# Patient Record
Sex: Female | Born: 1945
Health system: Southern US, Community
[De-identification: ages and names within clinical notes are randomized; demographics above are authoritative.]

## PROBLEM LIST (undated history)

## (undated) DIAGNOSIS — R0602 Shortness of breath: Secondary | ICD-10-CM

## (undated) DIAGNOSIS — I7300 Raynaud's syndrome without gangrene: Secondary | ICD-10-CM

## (undated) DIAGNOSIS — D649 Anemia, unspecified: Secondary | ICD-10-CM

## (undated) DIAGNOSIS — K219 Gastro-esophageal reflux disease without esophagitis: Secondary | ICD-10-CM

## (undated) DIAGNOSIS — D7589 Other specified diseases of blood and blood-forming organs: Secondary | ICD-10-CM

## (undated) DIAGNOSIS — Z87412 Personal history of vulvar dysplasia: Secondary | ICD-10-CM

## (undated) DIAGNOSIS — I872 Venous insufficiency (chronic) (peripheral): Secondary | ICD-10-CM

## (undated) DIAGNOSIS — R233 Spontaneous ecchymoses: Secondary | ICD-10-CM

## (undated) DIAGNOSIS — K922 Gastrointestinal hemorrhage, unspecified: Secondary | ICD-10-CM

## (undated) DIAGNOSIS — D071 Carcinoma in situ of vulva: Secondary | ICD-10-CM

## (undated) DIAGNOSIS — F419 Anxiety disorder, unspecified: Secondary | ICD-10-CM

## (undated) DIAGNOSIS — F329 Major depressive disorder, single episode, unspecified: Secondary | ICD-10-CM

## (undated) DIAGNOSIS — I429 Cardiomyopathy, unspecified: Secondary | ICD-10-CM

## (undated) DIAGNOSIS — G4700 Insomnia, unspecified: Secondary | ICD-10-CM

## (undated) DIAGNOSIS — I10 Essential (primary) hypertension: Secondary | ICD-10-CM

## (undated) DIAGNOSIS — F32A Depression, unspecified: Secondary | ICD-10-CM

## (undated) DIAGNOSIS — E871 Hypo-osmolality and hyponatremia: Secondary | ICD-10-CM

## (undated) DIAGNOSIS — J410 Simple chronic bronchitis: Secondary | ICD-10-CM

## (undated) DIAGNOSIS — Z8679 Personal history of other diseases of the circulatory system: Secondary | ICD-10-CM

## (undated) DIAGNOSIS — K6282 Dysplasia of anus: Secondary | ICD-10-CM

## (undated) DIAGNOSIS — M0600 Rheumatoid arthritis without rheumatoid factor, unspecified site: Secondary | ICD-10-CM

## (undated) DIAGNOSIS — E538 Deficiency of other specified B group vitamins: Secondary | ICD-10-CM

## (undated) DIAGNOSIS — Z8709 Personal history of other diseases of the respiratory system: Secondary | ICD-10-CM

## (undated) DIAGNOSIS — R351 Nocturia: Secondary | ICD-10-CM

## (undated) DIAGNOSIS — J449 Chronic obstructive pulmonary disease, unspecified: Secondary | ICD-10-CM

## (undated) DIAGNOSIS — M5430 Sciatica, unspecified side: Secondary | ICD-10-CM

## (undated) DIAGNOSIS — I2510 Atherosclerotic heart disease of native coronary artery without angina pectoris: Secondary | ICD-10-CM

## (undated) DIAGNOSIS — K529 Noninfective gastroenteritis and colitis, unspecified: Secondary | ICD-10-CM

## (undated) DIAGNOSIS — I447 Left bundle-branch block, unspecified: Secondary | ICD-10-CM

## (undated) DIAGNOSIS — R238 Other skin changes: Secondary | ICD-10-CM

## (undated) HISTORY — PX: CARDIOVASCULAR STRESS TEST: SHX262

## (undated) HISTORY — DX: Hypo-osmolality and hyponatremia: E87.1

## (undated) HISTORY — PX: OTHER SURGICAL HISTORY: SHX169

## (undated) HISTORY — DX: Essential (primary) hypertension: I10

## (undated) HISTORY — DX: Major depressive disorder, single episode, unspecified: F32.9

## (undated) HISTORY — DX: Sciatica, unspecified side: M54.30

## (undated) HISTORY — DX: Venous insufficiency (chronic) (peripheral): I87.2

## (undated) HISTORY — DX: Other specified diseases of blood and blood-forming organs: D75.89

## (undated) HISTORY — DX: Deficiency of other specified B group vitamins: E53.8

## (undated) HISTORY — DX: Anemia, unspecified: D64.9

## (undated) HISTORY — DX: Insomnia, unspecified: G47.00

## (undated) HISTORY — DX: Rheumatoid arthritis without rheumatoid factor, unspecified site: M06.00

## (undated) HISTORY — DX: Raynaud's syndrome without gangrene: I73.00

---

## 1990-09-01 HISTORY — PX: APPENDECTOMY: SHX54

## 1995-09-02 HISTORY — PX: VAGINAL HYSTERECTOMY: SUR661

## 1998-02-14 ENCOUNTER — Other Ambulatory Visit: Admission: RE | Admit: 1998-02-14 | Discharge: 1998-02-14 | Payer: Self-pay | Admitting: Emergency Medicine

## 1999-04-08 ENCOUNTER — Other Ambulatory Visit: Admission: RE | Admit: 1999-04-08 | Discharge: 1999-04-08 | Payer: Self-pay | Admitting: *Deleted

## 1999-04-23 ENCOUNTER — Inpatient Hospital Stay (HOSPITAL_COMMUNITY): Admission: EM | Admit: 1999-04-23 | Discharge: 1999-04-26 | Payer: Self-pay | Admitting: Emergency Medicine

## 1999-04-23 ENCOUNTER — Encounter: Payer: Self-pay | Admitting: Gastroenterology

## 1999-05-31 ENCOUNTER — Other Ambulatory Visit: Admission: RE | Admit: 1999-05-31 | Discharge: 1999-05-31 | Payer: Self-pay | Admitting: *Deleted

## 1999-05-31 ENCOUNTER — Encounter (INDEPENDENT_AMBULATORY_CARE_PROVIDER_SITE_OTHER): Payer: Self-pay

## 1999-06-24 ENCOUNTER — Other Ambulatory Visit: Admission: RE | Admit: 1999-06-24 | Discharge: 1999-06-24 | Payer: Self-pay | Admitting: *Deleted

## 1999-07-16 ENCOUNTER — Other Ambulatory Visit: Admission: RE | Admit: 1999-07-16 | Discharge: 1999-07-16 | Payer: Self-pay | Admitting: *Deleted

## 1999-07-16 ENCOUNTER — Encounter (INDEPENDENT_AMBULATORY_CARE_PROVIDER_SITE_OTHER): Payer: Self-pay

## 2000-05-05 ENCOUNTER — Other Ambulatory Visit: Admission: RE | Admit: 2000-05-05 | Discharge: 2000-05-05 | Payer: Self-pay | Admitting: *Deleted

## 2000-06-09 ENCOUNTER — Encounter: Admission: RE | Admit: 2000-06-09 | Discharge: 2000-06-09 | Payer: Self-pay | Admitting: *Deleted

## 2000-06-09 ENCOUNTER — Encounter: Payer: Self-pay | Admitting: *Deleted

## 2001-04-13 ENCOUNTER — Other Ambulatory Visit: Admission: RE | Admit: 2001-04-13 | Discharge: 2001-04-13 | Payer: Self-pay | Admitting: *Deleted

## 2001-06-22 ENCOUNTER — Encounter: Admission: RE | Admit: 2001-06-22 | Discharge: 2001-06-22 | Payer: Self-pay | Admitting: *Deleted

## 2001-06-22 ENCOUNTER — Encounter: Payer: Self-pay | Admitting: *Deleted

## 2002-06-27 ENCOUNTER — Encounter: Payer: Self-pay | Admitting: Emergency Medicine

## 2002-06-27 ENCOUNTER — Encounter: Admission: RE | Admit: 2002-06-27 | Discharge: 2002-06-27 | Payer: Self-pay | Admitting: Emergency Medicine

## 2002-07-19 ENCOUNTER — Other Ambulatory Visit: Admission: RE | Admit: 2002-07-19 | Discharge: 2002-07-19 | Payer: Self-pay | Admitting: Emergency Medicine

## 2002-10-05 ENCOUNTER — Ambulatory Visit: Admission: RE | Admit: 2002-10-05 | Discharge: 2002-10-05 | Payer: Self-pay | Admitting: Gynecologic Oncology

## 2002-10-26 ENCOUNTER — Ambulatory Visit (HOSPITAL_BASED_OUTPATIENT_CLINIC_OR_DEPARTMENT_OTHER): Admission: RE | Admit: 2002-10-26 | Discharge: 2002-10-26 | Payer: Self-pay | Admitting: Surgery

## 2002-11-22 ENCOUNTER — Encounter: Admission: RE | Admit: 2002-11-22 | Discharge: 2002-11-22 | Payer: Self-pay | Admitting: Surgery

## 2002-11-22 ENCOUNTER — Encounter: Payer: Self-pay | Admitting: Surgery

## 2003-01-03 ENCOUNTER — Encounter (INDEPENDENT_AMBULATORY_CARE_PROVIDER_SITE_OTHER): Payer: Self-pay | Admitting: *Deleted

## 2003-01-03 ENCOUNTER — Other Ambulatory Visit: Admission: RE | Admit: 2003-01-03 | Discharge: 2003-01-03 | Payer: Self-pay | Admitting: Gynecologic Oncology

## 2003-01-03 ENCOUNTER — Ambulatory Visit: Admission: RE | Admit: 2003-01-03 | Discharge: 2003-01-03 | Payer: Self-pay | Admitting: Gynecologic Oncology

## 2003-05-30 ENCOUNTER — Other Ambulatory Visit: Admission: RE | Admit: 2003-05-30 | Discharge: 2003-05-30 | Payer: Self-pay | Admitting: Gynecologic Oncology

## 2003-05-30 ENCOUNTER — Ambulatory Visit: Admission: RE | Admit: 2003-05-30 | Discharge: 2003-05-30 | Payer: Self-pay | Admitting: Gynecologic Oncology

## 2003-06-29 ENCOUNTER — Encounter: Admission: RE | Admit: 2003-06-29 | Discharge: 2003-06-29 | Payer: Self-pay | Admitting: Emergency Medicine

## 2003-07-07 ENCOUNTER — Encounter (INDEPENDENT_AMBULATORY_CARE_PROVIDER_SITE_OTHER): Payer: Self-pay

## 2003-07-07 ENCOUNTER — Ambulatory Visit (HOSPITAL_COMMUNITY): Admission: RE | Admit: 2003-07-07 | Discharge: 2003-07-07 | Payer: Self-pay | Admitting: Gastroenterology

## 2003-11-15 ENCOUNTER — Ambulatory Visit: Admission: RE | Admit: 2003-11-15 | Discharge: 2003-11-15 | Payer: Self-pay | Admitting: Gynecologic Oncology

## 2003-11-15 ENCOUNTER — Other Ambulatory Visit: Admission: RE | Admit: 2003-11-15 | Discharge: 2003-11-15 | Payer: Self-pay | Admitting: Gynecologic Oncology

## 2004-05-22 ENCOUNTER — Encounter (INDEPENDENT_AMBULATORY_CARE_PROVIDER_SITE_OTHER): Payer: Self-pay | Admitting: Specialist

## 2004-05-22 ENCOUNTER — Other Ambulatory Visit: Admission: RE | Admit: 2004-05-22 | Discharge: 2004-05-22 | Payer: Self-pay | Admitting: Gynecologic Oncology

## 2004-05-22 ENCOUNTER — Ambulatory Visit: Admission: RE | Admit: 2004-05-22 | Discharge: 2004-05-22 | Payer: Self-pay | Admitting: Gynecology

## 2004-07-12 ENCOUNTER — Encounter: Admission: RE | Admit: 2004-07-12 | Discharge: 2004-07-12 | Payer: Self-pay | Admitting: Emergency Medicine

## 2004-11-26 ENCOUNTER — Ambulatory Visit: Admission: RE | Admit: 2004-11-26 | Discharge: 2004-11-26 | Payer: Self-pay | Admitting: Gynecology

## 2004-11-26 ENCOUNTER — Encounter (INDEPENDENT_AMBULATORY_CARE_PROVIDER_SITE_OTHER): Payer: Self-pay | Admitting: *Deleted

## 2004-11-26 ENCOUNTER — Other Ambulatory Visit: Admission: RE | Admit: 2004-11-26 | Discharge: 2004-11-26 | Payer: Self-pay | Admitting: Gynecology

## 2005-05-06 ENCOUNTER — Encounter (INDEPENDENT_AMBULATORY_CARE_PROVIDER_SITE_OTHER): Payer: Self-pay | Admitting: *Deleted

## 2005-05-06 ENCOUNTER — Other Ambulatory Visit: Admission: RE | Admit: 2005-05-06 | Discharge: 2005-05-06 | Payer: Self-pay | Admitting: Gynecologic Oncology

## 2005-05-06 ENCOUNTER — Ambulatory Visit: Admission: RE | Admit: 2005-05-06 | Discharge: 2005-05-06 | Payer: Self-pay | Admitting: Gynecologic Oncology

## 2005-07-14 ENCOUNTER — Encounter: Admission: RE | Admit: 2005-07-14 | Discharge: 2005-07-14 | Payer: Self-pay | Admitting: Emergency Medicine

## 2005-12-02 ENCOUNTER — Ambulatory Visit: Admission: RE | Admit: 2005-12-02 | Discharge: 2005-12-02 | Payer: Self-pay | Admitting: Gynecologic Oncology

## 2005-12-02 ENCOUNTER — Encounter (INDEPENDENT_AMBULATORY_CARE_PROVIDER_SITE_OTHER): Payer: Self-pay | Admitting: *Deleted

## 2005-12-02 ENCOUNTER — Other Ambulatory Visit: Admission: RE | Admit: 2005-12-02 | Discharge: 2005-12-02 | Payer: Self-pay | Admitting: Gynecologic Oncology

## 2005-12-17 ENCOUNTER — Encounter: Admission: RE | Admit: 2005-12-17 | Discharge: 2005-12-17 | Payer: Self-pay | Admitting: Emergency Medicine

## 2006-07-16 ENCOUNTER — Encounter: Admission: RE | Admit: 2006-07-16 | Discharge: 2006-07-16 | Payer: Self-pay | Admitting: Emergency Medicine

## 2006-07-30 ENCOUNTER — Encounter: Admission: RE | Admit: 2006-07-30 | Discharge: 2006-07-30 | Payer: Self-pay | Admitting: Emergency Medicine

## 2007-08-02 ENCOUNTER — Encounter: Admission: RE | Admit: 2007-08-02 | Discharge: 2007-08-02 | Payer: Self-pay | Admitting: Emergency Medicine

## 2007-12-21 ENCOUNTER — Other Ambulatory Visit: Admission: RE | Admit: 2007-12-21 | Discharge: 2007-12-21 | Payer: Self-pay | Admitting: Gynecologic Oncology

## 2007-12-21 ENCOUNTER — Encounter (INDEPENDENT_AMBULATORY_CARE_PROVIDER_SITE_OTHER): Payer: Self-pay | Admitting: Gynecologic Oncology

## 2007-12-21 ENCOUNTER — Ambulatory Visit: Admission: RE | Admit: 2007-12-21 | Discharge: 2007-12-21 | Payer: Self-pay | Admitting: Gynecologic Oncology

## 2008-08-03 ENCOUNTER — Encounter: Admission: RE | Admit: 2008-08-03 | Discharge: 2008-08-03 | Payer: Self-pay | Admitting: Emergency Medicine

## 2008-12-28 ENCOUNTER — Encounter: Admission: RE | Admit: 2008-12-28 | Discharge: 2008-12-28 | Payer: Self-pay | Admitting: Emergency Medicine

## 2009-01-03 ENCOUNTER — Encounter (INDEPENDENT_AMBULATORY_CARE_PROVIDER_SITE_OTHER): Payer: Self-pay | Admitting: Gynecologic Oncology

## 2009-01-03 ENCOUNTER — Other Ambulatory Visit: Admission: RE | Admit: 2009-01-03 | Discharge: 2009-01-03 | Payer: Self-pay | Admitting: Gynecologic Oncology

## 2009-01-03 ENCOUNTER — Ambulatory Visit: Admission: RE | Admit: 2009-01-03 | Discharge: 2009-01-03 | Payer: Self-pay | Admitting: Gynecologic Oncology

## 2010-02-14 ENCOUNTER — Ambulatory Visit: Admission: RE | Admit: 2010-02-14 | Discharge: 2010-02-14 | Payer: Self-pay | Admitting: Gynecologic Oncology

## 2010-02-14 ENCOUNTER — Other Ambulatory Visit: Admission: RE | Admit: 2010-02-14 | Discharge: 2010-02-14 | Payer: Self-pay | Admitting: Gynecologic Oncology

## 2010-03-22 ENCOUNTER — Encounter: Admission: RE | Admit: 2010-03-22 | Discharge: 2010-03-22 | Payer: Self-pay | Admitting: Family Medicine

## 2010-04-04 ENCOUNTER — Ambulatory Visit (HOSPITAL_BASED_OUTPATIENT_CLINIC_OR_DEPARTMENT_OTHER): Admission: RE | Admit: 2010-04-04 | Discharge: 2010-04-04 | Payer: Self-pay | Admitting: Gynecologic Oncology

## 2010-04-04 HISTORY — PX: OTHER SURGICAL HISTORY: SHX169

## 2010-04-18 ENCOUNTER — Ambulatory Visit: Admission: RE | Admit: 2010-04-18 | Discharge: 2010-04-18 | Payer: Self-pay | Admitting: Gynecologic Oncology

## 2010-06-27 ENCOUNTER — Ambulatory Visit: Admission: RE | Admit: 2010-06-27 | Discharge: 2010-06-27 | Payer: Self-pay | Admitting: Gynecologic Oncology

## 2010-09-22 ENCOUNTER — Encounter: Payer: Self-pay | Admitting: Emergency Medicine

## 2010-11-15 LAB — DIFFERENTIAL
Eosinophils Relative: 0 % (ref 0–5)
Monocytes Relative: 8 % (ref 3–12)
Neutrophils Relative %: 62 % (ref 43–77)

## 2010-11-15 LAB — CBC
MCHC: 35.6 g/dL (ref 30.0–36.0)
Platelets: 229 10*3/uL (ref 150–400)
WBC: 6.3 10*3/uL (ref 4.0–10.5)

## 2010-11-15 LAB — POCT I-STAT 4, (NA,K, GLUC, HGB,HCT)
Glucose, Bld: 96 mg/dL (ref 70–99)
HCT: 39 % (ref 36.0–46.0)
Sodium: 132 mEq/L — ABNORMAL LOW (ref 135–145)

## 2010-12-12 ENCOUNTER — Ambulatory Visit: Payer: Self-pay | Admitting: Gynecologic Oncology

## 2011-01-02 ENCOUNTER — Ambulatory Visit: Payer: Medicare Other | Attending: Gynecologic Oncology | Admitting: Gynecologic Oncology

## 2011-01-02 ENCOUNTER — Other Ambulatory Visit: Payer: Self-pay | Admitting: Gynecologic Oncology

## 2011-01-02 ENCOUNTER — Other Ambulatory Visit (HOSPITAL_COMMUNITY)
Admission: RE | Admit: 2011-01-02 | Discharge: 2011-01-02 | Disposition: A | Discharge status: Home / Self Care | Payer: Medicare Other | Source: Ambulatory Visit | Attending: Gynecologic Oncology | Admitting: Gynecologic Oncology

## 2011-01-02 DIAGNOSIS — Z8540 Personal history of malignant neoplasm of unspecified female genital organ: Secondary | ICD-10-CM | POA: Insufficient documentation

## 2011-01-02 DIAGNOSIS — N900 Mild vulvar dysplasia: Secondary | ICD-10-CM | POA: Insufficient documentation

## 2011-01-06 NOTE — Consult Note (Signed)
  Regina Ortiz, Regina Ortiz                 ACCOUNT NO.:  [id number]  MEDICAL RECORD NO.:  [id number]            PATIENT TYPE:  LOCATION:                                 FACILITY:  PHYSICIAN:  Laurette Schimke, MD          DATE OF BIRTH:  DATE OF CONSULTATION:  01/01/2009 DATE OF DISCHARGE:                                CONSULTATION   DATE OF VISIT:  Jan 02, 2011.  REASON FOR VISIT:  Surveillance for multifocal genital dysplasia.  HISTORY OF PRESENT ILLNESS:  This is a 65 year old with multifocal lower genital tract dysplasia, both VIN and VAIN.  History is notable for hysterectomy for carcinoma in situ in 1997.  She subsequently underwent upper vaginal colpectomies in 1983 and 2000 for vaginal dysplasia.  In June 2011, a vulva biopsy demonstrated VIN III.  In August 2011, she underwent wide local excision of a right-sided inferior labia majora lesion and CO2 ablation of the right labia minora.  She denies any vulvar pruritus or discharge.  PAST MEDICAL HISTORY:  Arthritis, hypertension, low grade non-suicidal depression, osteoporosis, recurrent anal fissures, multifocal genital tract dysplasia.  SOCIAL HISTORY:  Her daughter-in-law is now in remission from her Hodgkin's lymphoma. Of note, her son recently died of lymphoblastic lymphoma.  REVIEW OF SYSTEMS:  No vulva pruritus, adenopathy, cough, shortness of breath; vulva or vaginal bleeding.  PHYSICAL EXAMINATION:  GENERAL:  Well-developed female in no acute distress. LYMPHATICS:  No cervical, supraclavicular or inguinal adenopathy. PELVIC:  Ascetic acid was placed on the external genitalia and no lesions were appreciated on pelvic examination.  No lesions were noted within the vagina.  A Pap test was obtained.  No rectal or cul-de-sac masses were appreciated.  IMPRESSION:  Regina Ortiz has multifocal genital dysplasia.  I have asked her to follow up in 1 year.     Laurette Schimke, MD     WB/MEDQ  D:  01/02/2011  T:   01/03/2011  Job:  [id number]  cc:  Telford Nab, RN  Demetria Pore. Coral Spikes, M.D. Fax: [phone]  Oley Balm. Georgina Pillion, M.D. Fax: [phone]  Electronically Signed by Laurette Schimke MD on 01/06/2011 12:00:00 PM

## 2011-01-14 NOTE — Consult Note (Signed)
NAME:  Regina Ortiz, Regina Ortiz                 ACCOUNT NO.:  [id number]   MEDICAL RECORD NO.:  [id number]          PATIENT TYPE:  OUT   LOCATION:  GYN                          FACILITY:  [facility]   PHYSICIAN:  John T. Kyla Balzarine, M.D.    DATE OF BIRTH:  [birth year]   DATE OF CONSULTATION:  12/21/2007  DATE OF DISCHARGE:                                 CONSULTATION   CHIEF COMPLAINT:  Followup of multifocal lower genital tract dysplasia.   HISTORY OF PRESENT ILLNESS:  This patient has longstanding lower genital  tract dysplasias that were multifocal, undergoing hysterectomy in the  1970s for CIS, upper colpectomies in 1983 and 2000 for vaginal  dysplasia.  She has had cytology varying between low grade SIL but over  the past 2 years had normal Pap smears and was released to q.12 month  followup.  She has noted no new vulvar lesions and a prior biopsy from  the vulva reveals hyperkeratosis only.   PAST HISTORY:  Significant for severe arthritis with chronic steroid  use, hypertension, low grade non-suicidal depression, and osteoporosis.  She has had recurrent anal fissures in the past.   CURRENT MEDICATIONS:  Atenolol, furosemide, Cymbalta, prednisone,  hydroxychloroquine, baby aspirin, calcium and potassium  supplementations, Centrum Silver, Tylenol Extra Strength, vitamin E and  vitamin D.   ALLERGIES:  None known.   PERSONAL AND SOCIAL HISTORY:  The patient is a smoker.   FAMILY HISTORY:  Noncontributory.   REVIEW OF SYSTEMS:  Otherwise negative, other than recent flare.  Should  be noted that she has intentionally lost weight and has also lost weight  while grieving since the death of her son during the interval year.   EXAM:  Weight 115 pounds, blood pressure 133/76, other vital signs  stable.  The patient is anxious, alert and oriented x3 in no acute distress.  LYMPH SURVEY:  Negative for pathologic lymphadenopathy.  ABDOMEN:  Soft and benign with no hernia, ascites, mass or tenderness.  BACK:  No CVA or spinous tenderness.  EXTREMITIES:  Multiple deformities caused by arthritis.  No pedal edema  or Homan's.  PELVIC:  External genitalia and BUS normal to inspection and palpation  with patch of hyperkeratosis (prior biopsy) in the inner labia at 7 to 8  o'clock.  The vulva is otherwise normal.  The vagina has atrophy with no  mucosal lesions.  Bimanual and rectovaginal examinations disclose absent  uterus and cervix.  No mass or nodularities.  Lower genital tract  dysplasia history.   PLAN:  Patient is at the a lifetime risk for genital tract dysplasia and  I recommended she continue to have annual cytology.  Pap smears repeated  and will be communicated to the patient.      John T. Kyla Balzarine, M.D.  Electronically Signed     JTS/MEDQ  D:  12/21/2007  T:  12/21/2007  Job:  [id number]   cc:   Telford Nab, R.N.  [address]  [place], [place] [address]   Oley Balm Georgina Pillion, M.D.  Fax: [phone]   Demetria Pore. Coral Spikes, M.D.  Fax: [phone]

## 2011-01-14 NOTE — Consult Note (Signed)
NAME:  Regina Ortiz, Regina Ortiz                 ACCOUNT NO.:  [id number]   MEDICAL RECORD NO.:  [id number]          PATIENT TYPE:  OUT   LOCATION:  GYN                          FACILITY:  [facility]   PHYSICIAN:  John T. Kyla Balzarine, M.D.    DATE OF BIRTH:  08/10/1946   DATE OF CONSULTATION:  01/03/2009  DATE OF DISCHARGE:                                 CONSULTATION   CHIEF COMPLAINT:  Follow up of multifocal lower genital tract dysplasia.   HISTORY OF PRESENT ILLNESS:  This patient has longstanding lower genital  tract dysplasias, multifocal.  She underwent hysterectomy in the 1970s  for CIS, upper colpectomies in 1983 and 2000 for vaginal dysplasia.  She  has had hyperkeratosis of the vulva without atypia.  Last Pap smear was  normal, and in the interim she has noted no new vulvar lesions or  symptoms.  Her biggest problem is related to involuntary weight loss of  approximately 20 pounds over the past year.  She has been taken off of  prednisone.  A chest x-ray and other workup to date has been negative.   PAST MEDICAL HISTORY:  1. Severe arthritis with chronic steroid use.  2. Hypertension.  3. Low-grade non-suicidal depression.  4. Osteoporosis.  5. Recurrent anal fissures in the past.  6. Multifocal lower genital tract dysplasias as above.   MEDICATIONS:  1. Atenolol.  2. Lasix.  3. Cymbalta.  4. Hydroxychloroquine.  5. Baby aspirin.  6. Nuvigil.  7. Low-dose aspirin.  8. Multivitamins including vitamin E and D.   ALLERGIES:  None known.   PERSONAL AND SOCIAL HISTORY:  The patient continues to smoke  approximately 8-10 cigarettes daily.   FAMILY HISTORY:  Noncontributory.   REVIEW OF SYSTEMS:  Otherwise negative comprehensive review.  It should  be noted that she has been grieving since the death of her son more than  1 year ago.   PHYSICAL EXAMINATION:  VITAL SIGNS:  Weight 96.8 pounds.  GENERAL:  The patient is alert and oriented x3, in no acute distress.  LYMPHATIC SURVEY:   Negative for pathologic lymphadenopathy.  BACK:  No spinous or CVA tenderness; modest kyphoscoliosis.  EXTREMITIES:  Distal deformities compatible with arthritis.  Full  strength and range of motion of the lower extremities without edema,  cords or Homan's.  ABDOMEN:  Soft and benign with no hernia, ascites, tenderness or mass.  PELVIC:  External genitalia and BUS normal to inspection and palpation  with the exception of a small ridge of hyperkeratosis  intermittently  from approximately 7 o'clock to 8 o'clock at the introitus.  No other  condylomatous or suspicious lesions.  Bladder and urethra are normal.  The vagina is atrophic.  Bimanual and rectovaginal examinations disclose  absent uterus and cervix with no mass or nodularity.   ASSESSMENT:  Multifocal lower genital tract dysplasia.   PLAN:  The patient reassured regarding her current status.  Cytology  obtained, and the patient will be a notified of results.  Follow up in 1  year.      John T. Kyla Balzarine, M.D.  Electronically Signed  JTS/MEDQ  D:  01/03/2009  T:  01/03/2009  Job:  [id number]   cc:   Telford Nab, R.N.  [address]  [place], [place] [address]   Oley Balm Georgina Pillion, M.D.  Fax: [phone]   Demetria Pore. Coral Spikes, M.D.  Fax: [phone]

## 2011-01-17 NOTE — Consult Note (Signed)
NAME:  Regina Ortiz, Regina Ortiz                           ACCOUNT NO.:  [id number]   MEDICAL RECORD NO.:  [id number]                   PATIENT TYPE:  OUT   LOCATION:  GYN                                  FACILITY:  [facility]   PHYSICIAN:  John T. Kyla Balzarine, M.D.                 DATE OF BIRTH:  [birth year]   DATE OF CONSULTATION:  01/03/2003  DATE OF DISCHARGE:                                   CONSULTATION   CHIEF COMPLAINT:  Persistent lower genital tract dysplasia.   HISTORY OF PRESENT ILLNESS:  The patient has a long-standing history of  lower genital tract dysplasia documented in multiple notes prior.  She had a  hysterectomy for CIN-III in the 1970's.  Upper colpectomy was performed by  Dr. Roberto Scales in 1983, with repeat upper colpectomy in 11/00, for return CIN-II.  The patient had recent Pap smear with atypical squamous cells with  undeterminate significance, and a positive screen for HPV.  When she was  seen in February, she was found to have foreshortening of the vagina and  probable dysplastic change at the introitus.  Vulvar hygiene regimen and  vaginal Premarin was recommended.  The patient underwent fissurectomy for  fistula in ano on 10/26/02.  Following surgery, she had protracted erythema  and tenderness of the perineal and perianal skin.  The patient attempted  multiple topical agents before this resolved.  It should be noted that she  has had to soak a wound in Betadine in the past, but had never reacted to  this.   PAST MEDICAL HISTORY:  1. Hypertension.  2. Mild depression.  3. Recurrent anal fissures.  4. Recurrent lower genital tract dysplasia.   PAST SURGICAL HISTORY:  1. Total abdominal hysterectomy.  2. Cholecystectomy.  3. Appendectomy.   MEDICATIONS:  1. Atenolol.  2. Norvasc.  3. Prozac.  4. Nexium.  5. Fosamax.  6. Potassium supplementation.  7. Aspirin.  8. Vitamin E.  9. Multivitamins.   ALLERGIES:  No known drug allergies.   Personal, social history,  family history, and review of systems are  otherwise negative.   PHYSICAL EXAMINATION:  VITAL SIGNS:  Weight 133 pounds, blood pressure  116/76.  GENERAL:  The patient is anxious, alert and oriented x3, in no acute  distress.  NECK:  There is no pathological lymphadenopathy.  ABDOMEN:  Soft and benign without ascites, mass, or organomegaly.  There are  no hernias.  EXTREMITIES:  No edema.  PELVIC:  External genitalia and BUS are normal to inspection and palpation  with the exception of focal hyperkeratosis involving posterior fourchet,  independent ridges of the labia minora.  The vagina is foreshortened having  a depth of approximately 5 cm with atrophic mucosa.  Cervix and uterus are  surgically absent.  Bimanual and rectovaginal examinations reveal no  parametrial or adnexal mass.  Anal fistula starting at 5 o'clock is well-  healed.   ASSESSMENT:  1. Multiple lower genitalia tract dyplasia, including probable low-grade     __________ versus chronic irritation.  2. Contact sensitization to iodine.   PLAN:  The patient basically defers vulvovaginal colposcopy and biopsy  because her skin reaction has only recently healed.  We will see her back  for followup in approximately three months.  The patient will continue her  rinse/blow dry regimen, and we will reinstitute vaginal Premarin.                                               John T. Kyla Balzarine, M.D.    JTS/MEDQ  D:  01/03/2003  T:  01/04/2003  Job:  [id number]   cc:   Ronda Fairly. Galen Daft, M.D.  [address], [address]  [place]  [place] [address]  Fax: [phone]   Oley Balm. Georgina Pillion, M.D.  [address]  [place]  [place] [address]  Fax: [phone]   Elliot Dally  [address]  [place] [address]  Fax: [phone]   Thornton Park. Daphine Deutscher, M.D.  [address]  [place]  [place] [address]  Fax: [phone]   Telford Nab, R.N.

## 2011-01-17 NOTE — Consult Note (Signed)
NAME:  Regina Ortiz, Regina Ortiz                 ACCOUNT NO.:  [id number]   MEDICAL RECORD NO.:  [id number]          PATIENT TYPE:  OUT   LOCATION:  GYN                          FACILITY:  [facility]   PHYSICIAN:  John T. Kyla Balzarine, M.D.    DATE OF BIRTH:  February 04, 1946   DATE OF CONSULTATION:  05/22/2004  DATE OF DISCHARGE:                                   CONSULTATION   CHIEF COMPLAINT:  Followup lower genital tract dysplasia.   HISTORY OF PRESENT ILLNESS:  The patient has longstanding lower genital  tract dysplasia documented on multiple prior notes. She underwent a  hysterectomy for CIN3 in the 1970's, upper colpectomy in 1983 and a repeat  upper colpectomy in November 2000 for vaginal dysplasia. She has had fistula-  in-ano and low grade SIL  cytology/colposcopy over the past several years.  She denies vulvar or vaginal symptoms other than episodic vulvar irrigation.   PAST MEDICAL HISTORY:  Hypertension, depression, recurrent anal fissures,  recurrent genital tract dysplasia.   PAST SURGICAL HISTORY:  TAH, cholecystectomy, appendectomy, colpectomies,  fissurectomies.   MEDICATIONS:  Atenolol, Norvasc, Prozac, Nexium Fosamax, potassium, aspirin,  vitamin E, multivitamins, vaginal Premarin.   ALLERGIES:  None known.   PERSONAL SOCIAL HISTORY:  Otherwise negative but continues to smoke.  She  irregularly uses vaginal Premarin.   PHYSICAL EXAMINATION:  VITAL SIGNS:  Weight 138 pounds and vital signs  stable/afebrile.  GENERAL:  The patient is alert and oriented x3 and in no acute distress.  EXTREMITIES:  No swelling.  LYMPH:  No pathologic lymphadenopathy.  ABDOMEN:  Soft and benign without ascites, mass or organomegaly. There are  no hernias.  PELVIC:  External genitalia and BUS are normal to inspection and palpation.  There is focal hyperkeratosis involving the posterior fourchette and ridges  of the labia minora. The vagina is foreshortened to a depth of 4.5 to 5 cm  with atrophic mucosa. The  cervix and the uterus are surgically absent.  Bimanual and rectovaginal examinations reveal no parametrial or adnexal  mass.  There are no active anal fissures or fistulae.   ASSESSMENT:  Multiple recurrent lower genital tract dysplasia.   PLAN:  The patient will continue Premarin vaginal cream.  Cytology repeated  and if she continues to have low grade changes or lower, we will continue  followup at six month intervals.     John   JTS/MEDQ  D:  05/22/2004  T:  05/23/2004  Job:  [id number]   cc:   Oley Balm. Georgina Pillion, M.D.  [address]  [place]  [place] [address]  Fax: [phone]   Salvatore Marvel, M.D.   Th[place] Park Daphine Deutscher, M.D.  [address]  [place]  [place] [address]  Fax: [phone]   Telford Nab, R.N.  [phone] [address]  [place], [place] [address]

## 2011-01-17 NOTE — Consult Note (Signed)
NAME:  Regina Ortiz, Regina Ortiz                 ACCOUNT NO.:  [id number]   MEDICAL RECORD NO.:  [id number]          PATIENT TYPE:  OUT   LOCATION:  GYN                          FACILITY:  [facility]   PHYSICIAN:  John T. Kyla Balzarine, M.D.    DATE OF BIRTH:  February 25, 1946   DATE OF CONSULTATION:  DATE OF DISCHARGE:                                   CONSULTATION   CHIEF COMPLAINT:  This 65 year old woman returns for continued followup of  lower genital tract dysplasia.   HISTORY OF PRESENT ILLNESS:  The patient has a longstanding history of lower  genital tract dysplasia treated with hysterectomy for CIS in the 1970s and  upper colpectomies in 1983 and 2000 for vaginal dysplasia.  She was seen in  our clinic and found to have hyperkeratosis on biopsy of the vulva.  Cytology has varied between low grade SIL, and more recently Pap smears have  normalized.   PAST MEDICAL HISTORY:  Reviewed.   PERSONAL/SOCIAL HISTORY:  Reviewed.   FAMILY HISTORY:  Reviewed.   CURRENT MEDICATIONS:  Patient remains on Premarin vaginal cream and multiple  medications for hypertension and depression.   She has had no recent fistula surgeries.   She has no drug allergies.   REVIEW OF SYSTEMS:  Other than noted above, a comprehensive 10-point review  of systems is negative.   PHYSICAL EXAMINATION:  VITAL SIGNS:  Weight 132 pounds.  GENERAL:  The patient is alert and oriented x3 in no acute distress.  ABDOMEN:  Soft and benign without ascites, mass, or organomegaly.  There is  no abdominal tenderness.  There is no back or CVA tenderness.  EXTREMITIES:  Full strength and range of motion without edema.  There is no  pathologic lymphadenopathy.  PELVIC:  External genitalia have hyperkeratosis in a patchy distribution  along the labia minor, previously biopsied.  The vagina is slightly atrophic  but has no mucosal lesions noted.  Bladder and urethra are normal.  Uterus  and cervix are absent, and there is no adnexal mass or  nodularity.   ASSESSMENT:  Multifocal lower genital tract dysplasia, currently clinically  no active disease.   PLAN:  Cytology repeated.  As long as this is no worse than low grade  squamous intraepithelial lesion.  She can continue to be seen at six month  intervals.      John T. Kyla Balzarine, M.D.  Electronically Signed     JTS/MEDQ  D:  05/06/2005  T:  05/06/2005  Job:  [id number]   cc:   Telford Nab, R.N.  [address]  [place], [place] [address]   Oley Balm Georgina Pillion, M.D.  [address]  [place]  [place] [address]  Fax: [phone]   Thornton Park. Daphine Deutscher, MD  [address]  [place]  [place] [address]

## 2011-01-17 NOTE — Op Note (Signed)
NAME:  Regina Ortiz, Regina Ortiz                           ACCOUNT NO.:  [id number]   MEDICAL RECORD NO.:  [id number]                   PATIENT TYPE:  AMB   LOCATION:  ENDO                                 [facility]ACILITY[facility]   PHYSICIAN:  James L. Malon Kindle., M.D.          DATE OF BIRTH:  11-28-1945   DATE OF PROCEDURE:  07/07/2003  DATE OF DISCHARGE:                                 OPERATIVE REPORT   PROCEDURE:  Colonoscopy with biopsy.   ENDOSCOPIST:  Llana Aliment. Edwards, M.D.   MEDICATIONS:  Fentanyl 50 mcg, Versed 8 mg IV.   INDICATIONS:  The patient has had a recent appendectomy and fistula in ano  resection.  She has had chronic diarrhea. She has had a small bowel series  looking for Crohn's disease.  This was negative.  She has four bowel  movements a day.  Due to her continuing diarrhea, concern about possible  Crohn's disease at her age, colonoscopy was performed.   DESCRIPTION OF PROCEDURE:  The procedure had been explained to the patient  including the potential risks and benefits and charts and materials were  seen in our office.  The scope was inserted.  The prep was quite good.  She  has chronic diarrhea and was really quite clean.  She had a long tortuous  colon, probably with some spasm, adhesions, several position changes,  abdominal pressure required.  We were able to reach the right colon.  This  was inflamed with mucosal edema.  At this point it was difficult to advance  further.  We had the whole scope and due to long tortuosity of the colon,  inability to advance further, we stopped at this point.  This was in the  right side of the colon so this was estimated to be __________.  Biopsies  were obtained upon removal.  There was diffuse edema with  irritation and  loss of the vascular pattern throughout the colon with some areas of which  the vascular pattern was maintained.  There was no diverticular disease, no  polyps, no neoplasia of any sort were seen throughout the  colon.  The scope  was withdrawn and her colon decompressed.  The patient tolerated the  procedure well.  There were no immediate problems.    ASSESSMENT:  Diarrhea with possible mild colitis on colonoscopy which may be  significant and may be related to the prep.   PLAN:  Will check pathology.  See back in the office in six weeks.                                               James L. Malon Kindle., M.D.    Waldron Session  D:  07/07/2003  T:  07/07/2003  Job:  [id number]   cc:  Oley Balm Georgina Pillion, M.D.  [address]  [place]  [place] [address]  Fax: [phone]   Thornton Park. Daphine Deutscher, M.D.  [address]  [place]  [place] [address]  Fax: [phone]

## 2011-01-17 NOTE — Consult Note (Signed)
NAME:  Regina Ortiz, Regina Ortiz                 ACCOUNT NO.:  [id number]   MEDICAL RECORD NO.:  [id number]          PATIENT TYPE:  OUT   LOCATION:  GYN                          FACILITY:  [facility]   PHYSICIAN:  De Blanch, M.D.DATE OF BIRTH:  12-Oct-1945   DATE OF CONSULTATION:  11/26/2004  DATE OF DISCHARGE:                                   CONSULTATION   A 65 year old white female returns for continuing follow-up with a past  history of high-grade dysplasia of the cervix and vulva.   Since her last visit, the patient has done well.  She denies any GI or GU  symptoms, has no pelvic pain, pressure, vaginal bleeding, or discharge.  She  denies any vulvar pruritus or any new lesions.   She uses Premarin cream once a week.   HISTORY OF PRESENT ILLNESS:  The patient has a longstanding history of lower  genital tract dysplasia.  She underwent a hysterectomy for carcinoma in situ  in the 1970's, an upper colpectomy in 1983 and 2000 for vaginal dysplasia.  She has had a fistula-in-ano with low-grade SIL previously; her last Pap  smear in September 2005 was normal.   PAST MEDICAL HISTORY:   MEDICAL ILLNESSES:  1.  Hypertension.  2.  Depression.  3.  Recurrent anal fissures.  4.  Recurrent genital tract dysplasia.   PAST SURGICAL HISTORY:  1.  Total abdominal hysterectomy.  2.  Cholecystectomy.  3.  Appendectomy.  4.  Upper colpectomy x 2.  5.  Fissurectomies.   CURRENT MEDICATIONS:  Atenolol, Norvasc, Prozac, Nexium, Fosamax, potassium,  aspirin, vitamin E, multivitamins, Premarin cream.   DRUG ALLERGIES:  None.   SOCIAL HISTORY:  The patient smokes.   REVIEW OF SYSTEMS:  Otherwise negative.   PHYSICAL EXAMINATION:  VITAL SIGNS:  Weight 137 pounds, blood pressure  130/80.  GENERAL:  The patient is a healthy white female in no acute distress.  HEENT:  Negative.  NECK:  Supple without thyromegaly.  There is no supraclavicular or inguinal  adenopathy.  ABDOMEN:  Soft,  nontender.  No masses, organomegaly, ascites, or hernias are  noted.  PELVIC EXAM:  EG/BUS is normal.  The vagina is normal and only minimally  foreshortened.  No lesions are noted.  Bimanual and rectovaginal exam reveal  no masses, induration, or nodularity.   IMPRESSION:  Past history of vaginal and cervical carcinoma in situ.  No  evidence of recurrent disease.  Pap smears are obtained today.  The patient  will have her mammograms obtained on an annual basis.  She is given a new  prescription for Premarin vaginal cream and will return to see [place] in 6  months for continuing follow up.      DC/MEDQ  D:  11/26/2004  T:  11/26/2004  Job:  [id number]   cc:   Oley Balm. Georgina Pillion, M.D.  [address]  [place]  [place] [address]  Fax: [phone]   Thornton Park. Daphine Deutscher, MD  [address]  [place]  [place] [address]   Telford Nab, R.N.  [phone] [address]  [place],  [place] [address]

## 2011-01-17 NOTE — Consult Note (Signed)
NAME:  Regina Ortiz, Regina Ortiz                 ACCOUNT NO.:  [id number]   MEDICAL RECORD NO.:  [id number]          PATIENT TYPE:  OUT   LOCATION:  GYN                          FACILITY:  [facility]   PHYSICIAN:  John T. Kyla Balzarine, M.D.    DATE OF BIRTH:  1945/12/18   DATE OF CONSULTATION:  12/02/2005  DATE OF DISCHARGE:                                   CONSULTATION   CHIEF COMPLAINT:  Followup of lower genital tract dysplasia.   HISTORY OF PRESENT ILLNESS:  The patient has longstanding lower genital  tract dysplasia, multifocal.  She underwent hysterectomy for CIS in the  1970s, upper colpectomies in 1983 and 2000 for vaginal dysplasia.  She has  been followed recently with cytology varying between low-grade SIL, but has  had Pap smears that have remained normal over the past 2 years.  She has no  new vulvar lesions and prior biopsy revealed hyperkeratosis.   PAST HISTORY, PERSONAL/SOCIAL HISTORY, FAMILY HISTORY:  Past history,  personal social history, family history are unchanged from that obtained  during an encounter in September 2004.  She does have recurrent anal  fissures in the past.  She is a smoker.   CURRENT MEDICATIONS:  Atenolol, Norvasc, Prozac, Nexium, Fosamax, potassium,  aspirin, vitamin E, multivitamins and vaginal Premarin.   ALLERGIES:  None known.   REVIEW OF SYSTEMS:  Otherwise negative.   EXAMINATION:  Weight 121 pounds.  There is no pathologic lymphadenopathy.  The abdomen is soft and benign.  There is no back or CVA tenderness.  External genitalia has scattered areas of hyperkeratosis but no condylomata  or ulcerated lesions.  Biopsy is not repeated as this is not significantly  changed.  Bladder and urethra normal.  Speculum examination reveals modestly  atrophic vagina without lesions.  Bimanual and rectovaginal examinations  reveal absent uterus and cervix with no mass or nodularity.   ASSESSMENT:  Multifocal lower genital tract dysplasia.   PLAN:  Cytology repeated.   The patient will continue vaginal Premarin.  If  today's Pap smear remains normal, we can see her on an annual basis.      John T. Kyla Balzarine, M.D.  Electronically Signed     JTS/MEDQ  D:  12/02/2005  T:  12/03/2005  Job:  [id number]   cc:   Telford Nab, R.N.  [address]  [place], [place] [address]   Oley Balm Georgina Pillion, M.D.  Fax: [phone]   Thorn[place] Park Daphine Deutscher, MD  [address]  [place]  [place] [address]

## 2011-01-17 NOTE — Consult Note (Signed)
NAME:  Regina Ortiz, Regina Ortiz                           ACCOUNT NO.:  [id number]   MEDICAL RECORD NO.:  [id number]                   PATIENT TYPE:  OUT   LOCATION:  GYN                                  FACILITY:  [facility]   PHYSICIAN:  John T. Kyla Balzarine, M.D.                 DATE OF BIRTH:  [birth year]   DATE OF CONSULTATION:  05/30/2003  DATE OF DISCHARGE:                                   CONSULTATION   CHIEF COMPLAINT:  Persistent lower genital tract dysplasia.   HISTORY OF PRESENT ILLNESS:  The patient with longstanding lower genital  tract dysplasia documented in multiple prior notes.  She had a hysterectomy  for CIN-3 in the 1970, upper colpectomy performed by Darcella Cheshire, M.D.  in [phone], repeat upper colpectomy in November of 2000 for recurrent vaginal  dysplasia.  The patient has had __________ and positive screen for HPV.  She  had negative evaluation at that time and recommendation was to reinstitute  Premarin and follow her frequently.  The patient underwent repair of a  fistula-in-ano in the Spring.  She has recurrent episodes of vulvar  irritation.   PAST MEDICAL HISTORY:  1. Hypertension.  2. Mild depression.  3. Recurrent anal fissures.  4. Recurrent genital tract dysplasia.   PAST SURGICAL HISTORY:  1. TAH.  2. Cholecystectomy.  3. Appendectomy.  4. Colpectomies.  5. Fissurectomies.   MEDICATIONS:  Atenolol, Norvasc, Prozac, Nexium, Fosamax, potassium  supplementation, aspirin, vitamin E, multivitamins, and vaginal Premarin.   ALLERGIES:  None known.   Personal and Social History, Family History, and Review of Systems otherwise  negative.   PHYSICAL EXAMINATION:  VITAL SIGNS:  Weight 134.5 pounds, blood pressure  142/82.  GENERAL:  The patient is alert and oriented x2 in no acute distress.  LYMPHATICS:  There is no pathologic lymphadenopathy.  ABDOMEN:  Soft and benign without ascites, mass, or organomegaly.  There are  no hernias.  EXTREMITIES:  No edema.  PELVIC:  External genitalia and BUS are normal to inspection and palpation.  She has focal hyperkeratosis involving the posterior fourchette and ridges  of the labia minora.  The vagina is foreshortened to the depth of 5 cm with  atrophic mucosa.  Cervix and uterus are surgically absent.  Bimanual and  rectovaginal examinations reveal no parametrial or adnexal mass.  There are  no active anal fistulae.   ASSESSMENT:  Multiple lower genital tract dysplasia.   PLAN:  The patient will reinstitute her Premarin vaginal cream, on hold over  the past few days because her prescription has run out.  Cytology is  repeated and if remains low-grade changes or below, we can see her back for  followup in six  months.  John T. Kyla Balzarine, M.D.    JTS/MEDQ  D:  05/30/2003  T:  05/30/2003  Job:  [id number]   cc:   Oley Balm. Georgina Pillion, M.D.  [address]  [place]  [place] [address]  Fax: [phone]   Onalee Hua M.D. [facility]  [address]  [place], [place] [address]   Thornton Park Daphine Deutscher, M.D.  [address]  [place]  [place] [address]  Fax: [phone]   Telford Nab, R.N.  [phone] [address]  [place], [place] [address]

## 2011-01-17 NOTE — Consult Note (Signed)
NAME:  Regina Ortiz, Regina Ortiz                           ACCOUNT NO.:  [id number]   MEDICAL RECORD NO.:  [id number]                   PATIENT TYPE:  OUT   LOCATION:  GYN                                  FACILITY:  [facility]   PHYSICIAN:  John T. Kyla Balzarine, M.D.                 DATE OF BIRTH:  03/10/1946   DATE OF CONSULTATION:  10/05/2002  DATE OF DISCHARGE:                                   CONSULTATION   CHIEF COMPLAINT:  Persistent lower genital tract dysplasia. Chronic draining  anal fissure.   HISTORY OF PRESENT ILLNESS:  The patient has a longstanding history of lower  genital tract dysplasia. She was initially treated in the 1970s with  hysterectomy for CIN 3. She underwent upper colpectomy performed by Dr.  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 She does note symptoms of vaginal dryness and  intermittent vulvar irritation. She denies STDs or genital condylomata. Of  note, she had a perirectal lesion lanced several months ago and has had  persistent discharge, irritation and pain. She wears panty liners for  hygienic reasons on a daily basis. The patient had a recent Pap smear with  atypical squamous cells of undetermined significance, positive screen for  HPV. The patient was evaluated by Dr. Galen Daft with negative examination and she  was sent to [place] for evaluation.   PAST MEDICAL HISTORY:  Significant for hypertension, mild depression, anal  fissure and recurrent lower genital tract dysplasia.   PAST SURGICAL HISTORY:  TAH and upper colpectomy x2.   MEDICATIONS:  Atenolol, Norvasc, Prozac, Nexium, Fosamax, potassium  supplementation, aspirin, vitamin E and multivitamins.   ALLERGIES:  None known.   SOCIAL HISTORY:  The patient is married. She does note symptoms of vaginal  dryness and occasional discomfort with intercourse. She smokes one pack per  day tobacco and admits social ethanol.   FAMILY HISTORY:  Noncontributory with  no gynecologic malignancies.   REVIEW OF SYMPTOMS:  Otherwise normal other than chronic diarrhea; the  patient has never had a full colonoscopy or barium enema.   PHYSICAL EXAMINATION:  VITAL SIGNS:  Weight 130 pounds, blood pressure  160/94, pulse 72, respirations 20.  GENERAL:  The patient is anxious, alert and oriented x3 in no acute  distress. She has no pathologic lymphadenopathy.  ABDOMEN:  Soft and benign without ascites, mass or organomegaly. There are  no hernias.  EXTREMITIES:  No edema.  PELVIC:  External genitalia and BUS are normal to inspection and palpation  with the exception of focal hyperkeratosis involving the posterior  fourchette and dependent ridges of the labia minora. The vagina is  foreshortened, having a depth of approximately 5 cm, with atrophic mucosa.  Cervix and uterus are surgically absent. Bimanual and rectovaginal  examinations reveal no parametrial or adnexal mass and a rectal examination  is deferred  because of induration and draining fistula at approximately 5  o'clock.   ASSESSMENT:  Abnormal genital cytology in the setting of multiply recurrent  lower genital tract dysplasia. Hyperkeratotic lesions of the external  genitalia suspicious for low grade VIN versus chronic irritation. Anal  fissure.   PLAN:  I recommended colposcopy (procedure described below) which  essentially confirmed the gross findings. No biopsies are taken today.   I recommended a vulvar hygiene regimen of b.i.d. rinse/blots/blow dry to  reduce inflammation of the vulva. The patient is scheduled to undergo a  fissurectomy in the next couple of days. She is given a prescription for  vaginal Premarin to be used 1 gm once or twice weekly in an effort to  thicken the vaginal mucosa. We will reevaluate her including inspection of  the vulva and with cytology in approximately 3-4 months; if she has  persistent vulvar lesions, these should be biopsied.   ADDENDUM:  The patient  underwent appendectomy for a ruptured appendix  approximately four years ago.   PROCEDURE NOTE:  After verbal informed consent was obtained, colposcopy was  performed with inspection of the entire vaginal canal and the vulva after  prolonged acetic acid application. The vagina has atrophic changes, without  acetowhite epithelium; no biopsies are obtained. Externally, there are flat,  acetowhite lesions ranging in size up to 1 cm in diameter involving the  posterolateral crests of the introitus and leading edges of the labia  minora. No atypical vascular patterns are seen in these lesions.  Differential diagnosis includes low grade VIN versus chronic hyperkeratosis.   DISPOSITION:  As noted above.                                                John T. Kyla Balzarine, M.D.    JTS/MEDQ  D:  10/05/2002  T:  10/05/2002  Job:  [id number]   cc:   Ronda Fairly. Galen Daft, M.D.  [address], [address]  [place]  [place] [address]  Fax: [phone]   Oley Balm. Georgina Pillion, M.D.  [address]  [place]  [place] [address]  Fax: [phone]   Elliot Dally  [address]  [place] [address]  Fax: [phone]   Telford Nab, R.N.  [address] [place], [place] [address]  Fax: [phone]

## 2011-01-17 NOTE — Consult Note (Signed)
NAME:  Regina Ortiz, Regina Ortiz                           ACCOUNT NO.:  [id number]   MEDICAL RECORD NO.:  [id number]                   PATIENT TYPE:  OUT   LOCATION:  GYN                                  FACILITY[facility]   PHYSICIAN:  John T. Kyla Balzarine, M.D.                 DATE OF BIRTH:  26-Jul-1946   DATE OF CONSULTATION:  11/15/2003  DATE OF DISCHARGE:  11/15/2003                                   CONSULTATION   CHIEF COMPLAINT:  Follow up of lower genital tract dysplasia.   HISTORY OF PRESENT ILLNESS:  The patient has long-standing lower genital  tract dysplasia, documented in multiple prior notes.  She underwent  hysterectomy for CIN-3 in the 1970's, upper colpectomy in 1983, and a repeat  upper colpectomy in November 2000 for vaginal dysplasia.  The patient has  had fistula-in-ano, and has had low-grade SIL cytology and colposcopy,  compatible with that finding over the past several years.  She denies  vaginal symptoms.  She does have recurrent episodes of  vulva irritation,  but denies condylomatous lesions or pruritus.   PAST MEDICAL HISTORY:  1. Hypertension.  2. Depression.  3. Recurrent anal fissures.  4. Recurrent genital tract dysplasia.   PAST SURGICAL HISTORY:  1. TAH.  2. Cholecystectomy.  3. Appendectomy.  4. Colpectomies.  5. Fissurectomies.   MEDICATIONS:  1. Atenolol.  2. Norvasc.  3. Prozac.  4. Nexium.  5. Fosamax.  6. Potassium.  7. Aspirin.  8. Vitamin E.  9. Multivitamins.  10.      Vaginal Premarin.   ALLERGIES:  None known.   PERSONAL/SOCIAL HISTORY/FAMILY HISTORY/REVIEW OF SYSTEMS:  Otherwise  negative, but she does smoke 1 pack per day of tobacco.  She irregularly  uses vaginal Premarin.   PHYSICAL EXAMINATION:  VITAL SIGNS:  Weight 133 pounds.  Vital signs stable.  GENERAL:  The patient is alert and oriented x3.  In no acute distress.  There is no pathologic lymphadenopathy.  EXTREMITIES:  No swelling.  PELVIC:  External genitalia and BUS are  normal to inspection and palpation.  The vagina is approximately 5 cm with atrophic mucosa.  There are no lesions  in the vaginal mucosa.  Bimanual and rectovaginal examinations reveal absent  uterus and cervix without adnexal or parametrial mass/nodularity.   ASSESSMENT:  Recurrent low-grade genital tract dysplasia.   PLAN:  The patient was again encouraged to use her vaginal estrogen on a  frequent basis.  We will continue to see her at 75-month intervals, as long  as she continues to have low-grade changes.  I would repeat colposcopy if  she had changes that suggested a higher grade lesion, or a persistence of  low-grade SIL changes for more than 2 years.  If she normalizes Pap smears,  we could see her at annual intervals.  John T. Kyla Balzarine, M.D.    JTS/MEDQ  D:  11/15/2003  T:  11/17/2003  Job:  [id number]   cc:   Telford Nab, R.N.  [address]  [place], [place] [address]

## 2011-01-17 NOTE — Op Note (Signed)
   NAME:  Regina Ortiz, Regina Ortiz                           ACCOUNT NO.:  [id number]   MEDICAL RECORD NO.:  [id number]                   PATIENT TYPE:  AMB   LOCATION:  DSC                                  FACILITY:  [facility]   PHYSICIAN:  Thornton Park. Daphine Deutscher, M.D.             DATE OF BIRTH:  [birth year]   DATE OF PROCEDURE:  10/26/2002  DATE OF DISCHARGE:                                 OPERATIVE REPORT   CCS# [id number]   PREOPERATIVE DIAGNOSIS:  Recurrent perirectal abscesses with evidence of  fistula.   POSTOPERATIVE DIAGNOSIS:  Fistula in ano status post fissurectomy.   SURGEON:  Thornton Park. Daphine Deutscher, M.D.   ANESTHESIA:  General   INDICATIONS FOR PROCEDURE:  The patient is a 65 year old lady with a several  month history of recurrent bouts of pain and drainage associated with a  previous perirectal abscess which had been drained.   DESCRIPTION OF PROCEDURE:  The patient was taken to room #3 and given  general anesthesia and placed up in the dorsal lithotomy position.  After  prepping with Betadine and draping sterilely, I introduced a Ferguson  retractor anteriorly and found this hard area that she had pointed to on the  left side, which was in the posterior half of the anal rectal region.  I  inserted a blunt tip needle with peroxide and gently injected and it foamed  almost near the edge of the anorectal junction posteriorly in what appeared  to be a fissure.  This little fistula was then easily cannulated with a  malleable tear duct probe and it came out through this opening.  This  appeared to go through the internal sphincter posteriorly and did not appear  to go through the external sphincter posteriorly and I elected to go ahead  and incise this and open this.  At the base, I found granulation tissue  which I scrapped out with a blade, cauterized with the electrocautery.  I  massaged some Betadine ointment in the area and injected the area with 0.25%  Marcaine.  The patient seemed to  tolerate the procedure well.  She will be  given some Tylox to take for pain and will follow up in the office in two to  three weeks.                                                Thornton Park Daphine Deutscher, M.D.    MBM/MEDQ  D:  10/26/2002  T:  10/26/2002  Job:  [id number]   cc:   Oley Balm. Georgina Pillion, M.D.  [address]  [place]  [place] [address]  Fax: [phone]

## 2011-02-18 ENCOUNTER — Other Ambulatory Visit: Payer: Self-pay | Admitting: Family Medicine

## 2011-02-18 DIAGNOSIS — Z1231 Encounter for screening mammogram for malignant neoplasm of breast: Secondary | ICD-10-CM

## 2011-03-24 ENCOUNTER — Ambulatory Visit
Admission: RE | Admit: 2011-03-24 | Discharge: 2011-03-24 | Disposition: A | Discharge status: Home / Self Care | Payer: Medicare Other | Source: Ambulatory Visit | Attending: Family Medicine | Admitting: Family Medicine

## 2011-03-24 DIAGNOSIS — Z1231 Encounter for screening mammogram for malignant neoplasm of breast: Secondary | ICD-10-CM

## 2011-03-26 ENCOUNTER — Other Ambulatory Visit: Payer: Self-pay | Admitting: Family Medicine

## 2011-03-26 DIAGNOSIS — N6320 Unspecified lump in the left breast, unspecified quadrant: Secondary | ICD-10-CM

## 2011-04-01 ENCOUNTER — Ambulatory Visit
Admission: RE | Admit: 2011-04-01 | Discharge: 2011-04-01 | Disposition: A | Discharge status: Home / Self Care | Payer: Medicare Other | Source: Ambulatory Visit | Attending: Family Medicine | Admitting: Family Medicine

## 2011-04-01 DIAGNOSIS — N6320 Unspecified lump in the left breast, unspecified quadrant: Secondary | ICD-10-CM

## 2012-01-27 ENCOUNTER — Encounter: Payer: Self-pay | Admitting: Gynecologic Oncology

## 2012-01-29 ENCOUNTER — Encounter: Payer: Self-pay | Admitting: Gynecologic Oncology

## 2012-01-29 ENCOUNTER — Ambulatory Visit: Payer: Medicare Other | Attending: Gynecologic Oncology | Admitting: Gynecologic Oncology

## 2012-01-29 VITALS — BP 120/62 | HR 66 | Temp 97.8°F | Resp 16 | Ht 63.58 in | Wt 102.6 lb

## 2012-01-29 DIAGNOSIS — N879 Dysplasia of cervix uteri, unspecified: Secondary | ICD-10-CM | POA: Insufficient documentation

## 2012-01-29 DIAGNOSIS — IMO0001 Reserved for inherently not codable concepts without codable children: Secondary | ICD-10-CM

## 2012-01-29 DIAGNOSIS — F17200 Nicotine dependence, unspecified, uncomplicated: Secondary | ICD-10-CM | POA: Insufficient documentation

## 2012-01-29 DIAGNOSIS — D071 Carcinoma in situ of vulva: Secondary | ICD-10-CM | POA: Insufficient documentation

## 2012-01-29 DIAGNOSIS — F3289 Other specified depressive episodes: Secondary | ICD-10-CM | POA: Insufficient documentation

## 2012-01-29 DIAGNOSIS — N900 Mild vulvar dysplasia: Secondary | ICD-10-CM | POA: Insufficient documentation

## 2012-01-29 DIAGNOSIS — Z7982 Long term (current) use of aspirin: Secondary | ICD-10-CM | POA: Insufficient documentation

## 2012-01-29 DIAGNOSIS — F329 Major depressive disorder, single episode, unspecified: Secondary | ICD-10-CM | POA: Insufficient documentation

## 2012-01-29 DIAGNOSIS — I10 Essential (primary) hypertension: Secondary | ICD-10-CM | POA: Insufficient documentation

## 2012-01-29 DIAGNOSIS — Z807 Family history of other malignant neoplasms of lymphoid, hematopoietic and related tissues: Secondary | ICD-10-CM | POA: Insufficient documentation

## 2012-01-29 DIAGNOSIS — M810 Age-related osteoporosis without current pathological fracture: Secondary | ICD-10-CM | POA: Insufficient documentation

## 2012-01-29 DIAGNOSIS — Z8249 Family history of ischemic heart disease and other diseases of the circulatory system: Secondary | ICD-10-CM | POA: Insufficient documentation

## 2012-01-29 DIAGNOSIS — Z833 Family history of diabetes mellitus: Secondary | ICD-10-CM | POA: Insufficient documentation

## 2012-01-29 NOTE — Progress Notes (Signed)
Follow-up Note: Gyn-Onc   Regina Ortiz 66 y.o. female  CC:  Chief Complaint  Patient presents with  . Multifocal Dysplasia    Follow up    HPI: Surveillance for multifocal genital dysplasia.   HISTORY OF PRESENT ILLNESS: This is a 66 year old with multifocal lower  genital tract dysplasia, both VIN and VAIN. History is notable for  hysterectomy for carcinoma in situ in 1997. She subsequently underwent  upper vaginal colpectomies in 1983 and 2000 for vaginal dysplasia. In  June 2011, a vulva biopsy demonstrated VIN III. In August 2011, she  underwent wide local excision of a right-sided inferior labia majora  lesion and CO2 ablation of the right labia minora. She denies any  vulvar pruritus or discharge.   Pap 2010, 2011, 2012 wnl.   REVIEW OF SYSTEMS: No vulva pruritus, adenopathy, cough, shortness of  breath; vulva or vaginal bleeding.     Current Meds:  Outpatient Encounter Prescriptions as of 01/29/2012  Medication Sig Dispense Refill  . alendronate (FOSAMAX) 70 MG tablet Take 70 mg by mouth every 7 (seven) days. Take with a full glass of water on an empty stomach.      Marland Kitchen aspirin 81 MG tablet Take 81 mg by mouth daily.      . Calcium Carbonate-Vitamin D (CALTRATE 600+D PO) Take by mouth daily.      . Cholecalciferol (VITAMIN D3) 1000 UNITS CAPS Take by mouth daily.      Marland Kitchen diltiazem (TIAZAC) 360 MG 24 hr capsule Take 360 mg by mouth daily.      Marland Kitchen etanercept (ENBREL) 50 MG/ML injection Inject 50 mg into the skin once a week.      . losartan-hydrochlorothiazide (HYZAAR) 100-12.5 MG per tablet Take 1 tablet by mouth daily.      Marland Kitchen Lysine 500 MG TABS Take 1 tablet by mouth 2 (two) times daily.      Marland Kitchen PREDNISONE PO Take 7.5 mg by mouth daily.      Marland Kitchen PRENATAL VITAMINS PO Take 1 tablet by mouth daily.        Allergy: No Known Allergies  Social Hx:   History   Social History  . Marital Status: Married    Spouse Name: N/A    Number of Children: N/A  . Years of  Education: N/A   Occupational History  . Not on file.   Social History Main Topics  . Smoking status: Current Everyday Smoker -- 0.5 packs/day for 41 years    Types: Cigarettes  . Smokeless tobacco: Not on file  . Alcohol Use: Yes     12-15 beers a week  . Drug Use: Not on file  . Sexually Active: Not on file   Other Topics Concern  . Not on file   Social History Narrative  . No narrative on file    Past Surgical Hx:  Past Surgical History  Procedure Date  . Abdominal hysterectomy 1997  . Wide local excision of labia majora 04/2010    Right-sided lesion, CO2 ablation of right labia minora  . Appendectomy     Ruptured, around 15 years ago    Past Medical Hx:  Past Medical History  Diagnosis Date  . Dysplasia     Multifocal genital, VIN and VAIN  . Arthritis   . Hypertension   . Osteoporosis   . History of anal fissures   . Mild depression     Family Hx:  Family History  Problem Relation Age of Onset  .  Diabetes Mother   . Hypertension Father   . Lymphoma Son      Vitals:  Blood pressure 120/62, pulse 66, temperature 97.8 F (36.6 C), temperature source Oral, resp. rate 16, height 5' 3.58" (1.615 m), weight 102 lb 9.6 oz (46.539 kg).  Physical Exam: WD female in NAD BP 120/62  Pulse 66  Temp(Src) 97.8 F (36.6 C) (Oral)  Resp 16  Ht 5' 3.58" (1.615 m)  Wt 102 lb 9.6 oz (46.539 kg)  BMI 17.84 kg/m2   LYMPHATICS: No cervical, supraclavicular or inguinal adenopathy.  PELVIC: Acetic acid was placed on the external genitalia andmultifocal lesions were appreciated on the left labia minora.  WE was present at the R posterior fourchette.  Vagina atrophic without lesions. Abdomen  Normoactive bowel sounds, abdomen soft, non-tender.  Extremities  No bilateral cyanosis, clubbing or edema.    Assessment/Plan:  This is a 66 y.o. year old with h/o CIN and VIN.  Now with lesions c/w recurrent multifocal vulvar dysplasia.  Bx collected..  Treatment options d/w  patinet inclusive of excision, laser or topical Aldara.  Patient opts for trial of Aldara if dysplasia is confirmed.   Will call with results.  If VIN will treat with Aldara for 16 weeks, tree times weekly. With interval colposcopy at 12 weeks.   Laurette Schimke, MD., PhD. 01/29/2012, 4:17 PM

## 2012-01-29 NOTE — Patient Instructions (Signed)
Will call with results.  If VIN will treat with Aldara for 16 weeks, tree times weekly. With interval colposcopy at 12 weeks.

## 2012-02-17 ENCOUNTER — Other Ambulatory Visit: Payer: Self-pay | Admitting: Gynecologic Oncology

## 2012-02-17 ENCOUNTER — Telehealth: Payer: Self-pay | Admitting: Gynecologic Oncology

## 2012-02-17 NOTE — Telephone Encounter (Signed)
Spoke with patient regarding results.  Plan for Aldar three times weekly to L labia and also to the R posterior fourchette for 12 weeks.  Will repeat colposcopy at 12 week. \ Warner Mccreedy NP advised to call in Aldara.

## 2012-02-18 ENCOUNTER — Telehealth: Payer: Self-pay | Admitting: Gynecologic Oncology

## 2012-02-18 NOTE — Telephone Encounter (Signed)
Attempted to call patient to obtain pharmacy information.  Pt unavailable at this time.

## 2012-02-18 NOTE — Telephone Encounter (Signed)
Spoke with patient about Dr. Forrestine Him recommendations to begin aldara cream treatment for 12 weeks.  Pt stating that she does "not want to be miserable for four months."  Stating "during the treatment, if I wanted to stop using the cream because it is unbearable, then could I have laser treatment done instead."  Informed patient that Dr. Nelly Rout would be made aware of her concerns and that I would contact her later today.

## 2012-02-19 ENCOUNTER — Other Ambulatory Visit: Payer: Self-pay | Admitting: Gynecologic Oncology

## 2012-02-19 ENCOUNTER — Telehealth: Payer: Self-pay | Admitting: Gynecologic Oncology

## 2012-02-19 NOTE — Progress Notes (Signed)
Attempted to sent aldara cream to [facility] on [address] in [place] but selected pharm not set up with E prescribe.  Order called in to the pharmacy.  Pt aware

## 2012-02-19 NOTE — Telephone Encounter (Signed)
Spoke with patient about aldara cream.  Informed that she can decrease the frequency to twice a week if three times a week is not tolerable per Dr. Nelly Rout.  Pt verbalizing understanding and wanting to proceed with aldara treatment as directed by Dr. Nelly Rout.  Aldara cream 3x a week as directed for 12 weeks, 0 refills called into [facility] on [address] in [place].  Pt informed of the need for a 12 week follow up appointment after treatment.  Instructed to call for any questions or concerns.

## 2012-02-24 ENCOUNTER — Telehealth: Payer: Self-pay | Admitting: Gynecologic Oncology

## 2012-02-24 NOTE — Telephone Encounter (Signed)
Spoke with patient about the use of aldara cream.  Pt stating that she starting using the cream last night.  No complaints or concerns voiced.  Follow up appointment arranged for 12 weeks after the initiation of the cream: Sept 19, 2013 with Dr. Nelly Rout.  Instructed to call for any questions or concerns.

## 2012-02-26 ENCOUNTER — Telehealth: Payer: Self-pay | Admitting: Gynecologic Oncology

## 2012-02-26 NOTE — Telephone Encounter (Signed)
Spoke with patient about message left about aldara cream use.  Patient stating that she had been using the cream, but has constant urination at night.  Pt is concerned about the aldara cream staying on.  Stating that the urination at night is not a new symptom and she is voiding three to four times a night.  Stating that she lightly pats her vulva dry.  Patient instructed to decrease fluid intake after dinner and to continue with the light patting to dry after urination as minimal as possible.  Instructions for cream use discussed and instructed to use a mirror with application.  Instructed to call for any questions or concerns.

## 2012-03-24 ENCOUNTER — Telehealth: Payer: Self-pay | Admitting: Gynecologic Oncology

## 2012-03-24 NOTE — Telephone Encounter (Signed)
Pt called and spoke with Telford Nab, RN about discomfort experienced around the peri-anal area when using the aldara cream.  Instructed to stop using the cream on the lesion near the peri-anal area but continue use on the other lesion.  Pt to come and see Dr. Nelly Rout on Tuesday, July 30.  Instructed to call for any questions or concerns.

## 2012-03-30 ENCOUNTER — Ambulatory Visit: Payer: Medicare Other | Attending: Gynecologic Oncology | Admitting: Gynecologic Oncology

## 2012-03-30 ENCOUNTER — Encounter: Payer: Self-pay | Admitting: Gynecologic Oncology

## 2012-03-30 VITALS — BP 110/60 | HR 60 | Temp 98.5°F | Resp 12 | Ht 63.58 in | Wt 100.6 lb

## 2012-03-30 DIAGNOSIS — M810 Age-related osteoporosis without current pathological fracture: Secondary | ICD-10-CM | POA: Insufficient documentation

## 2012-03-30 DIAGNOSIS — N900 Mild vulvar dysplasia: Secondary | ICD-10-CM

## 2012-03-30 DIAGNOSIS — I10 Essential (primary) hypertension: Secondary | ICD-10-CM | POA: Insufficient documentation

## 2012-03-30 DIAGNOSIS — Z90710 Acquired absence of both cervix and uterus: Secondary | ICD-10-CM | POA: Insufficient documentation

## 2012-03-30 DIAGNOSIS — N9089 Other specified noninflammatory disorders of vulva and perineum: Secondary | ICD-10-CM | POA: Insufficient documentation

## 2012-03-30 NOTE — Patient Instructions (Addendum)
Will schedule CO2 laser of the vulvar to occur 06/2012   Thank you very much Ms. Regina Ortiz for allowing me to provide care for you today.  I appreciate your confidence in choosing our Gynecologic Oncology team.  If you have any questions about your visit today please call our office and we will get back to you as soon as possible.  [name]. Roslynn Holte MD., PhD Gynecologic Oncology

## 2012-03-30 NOTE — Progress Notes (Signed)
Follow-up Note: Gyn-Onc   Regina Ortiz 66 y.o. female  CC:  Chief Complaint  Patient presents with  . VIN    Follow up    HPI:  HISTORY OF PRESENT ILLNESS: This is a 66 year old with multifocal lower  genital tract dysplasia, both VIN and VAIN. History is notable for  hysterectomy for carcinoma in situ in 1997. She subsequently underwent  upper vaginal colpectomies in 1983 and 2000 for vaginal dysplasia. In  June 2011, a vulva biopsy demonstrated VIN III. In August 2011, she  underwent wide local excision of a right-sided inferior labia majora  lesion and CO2 ablation of the right labia minora. She denies any  vulvar pruritus or discharge.   Pap 2010, 2011, 2012 wnl. INTERVAL HISTORY:  Patient seen 01/2012.  Bx c/w VIN III.  Patient given trial of Aldara.  She states that it has been difficult to apply Aldara to the external genitalia at the site of the lesions.  Patient requests CO2 laser.     REVIEW OF SYSTEMS: No vulva pruritus, adenopathy, cough, shortness of  breath; vulva or vaginal bleeding.   Past Surgical Hx:  Past Surgical History  Procedure Date  . Abdominal hysterectomy 1997  . Wide local excision of labia majora 04/2010    Right-sided lesion, CO2 ablation of right labia minora  . Appendectomy     Ruptured, around 15 years ago    Past Medical Hx:  Past Medical History  Diagnosis Date  . Dysplasia     Multifocal genital, VIN and VAIN  . Arthritis   . Hypertension   . Osteoporosis   . History of anal fissures   . Mild depression     Family Hx:  Family History  Problem Relation Age of Onset  . Diabetes Mother   . Hypertension Father   . Lymphoma Son      Vitals:  Blood pressure 110/60, pulse 60, temperature 98.5 F (36.9 C), temperature source Oral, resp. rate 12, height 5' 3.58" (1.615 m), weight 100 lb 9.6 oz (45.632 kg).  Physical Exam: WD female in NAD BP 110/60  Pulse 60  Temp 98.5 F (36.9 C) (Oral)  Resp 12  Ht 5' 3.58" (1.615 m)   Wt 100 lb 9.6 oz (45.632 kg)  BMI 17.50 kg/m2   LYMPHATICS: No cervical, supraclavicular or inguinal adenopathy.  PELVIC: External genitalia with multifocal lesions on the left labia minora and the right posterior fourchette.   Assessment/Plan:  This is a 66 y.o. year old with recurrent multifocal vulvar dysplasia. Patient declines to continue the trial of Aldara.  Will schedule CO2 laser of the vulvar to occur 06/2012 per her request.  Laurette Schimke, MD., PhD. 03/30/2012, 4:03 PM   .

## 2012-05-20 ENCOUNTER — Ambulatory Visit: Payer: Medicare Other | Admitting: Gynecologic Oncology

## 2012-05-27 ENCOUNTER — Encounter: Payer: Self-pay | Admitting: Gynecologic Oncology

## 2012-05-27 ENCOUNTER — Ambulatory Visit: Payer: Medicare Other | Attending: Gynecologic Oncology | Admitting: Gynecologic Oncology

## 2012-05-27 VITALS — BP 140/80 | HR 80 | Temp 98.0°F | Resp 16 | Ht 63.58 in | Wt 99.8 lb

## 2012-05-27 DIAGNOSIS — D071 Carcinoma in situ of vulva: Secondary | ICD-10-CM | POA: Insufficient documentation

## 2012-05-27 DIAGNOSIS — N900 Mild vulvar dysplasia: Secondary | ICD-10-CM

## 2012-05-27 NOTE — Patient Instructions (Signed)
CO2 laser ablation of the external genitalia on 06/22/2012.

## 2012-05-27 NOTE — Progress Notes (Signed)
Follow-up Note: Gyn-Onc   Regina Ortiz 66 y.o. female  CC:  Chief Complaint  Patient presents with  . VIN III    Follow up    HPI:  HISTORY OF PRESENT ILLNESS: This is a 66 year old with multifocal lower genital tract dysplasia, both VIN and VAIN. History is notable for  hysterectomy for carcinoma in situ in 1997. She subsequently underwent  upper vaginal colpectomies in 1983 and 2000 for vaginal dysplasia. In  June 2011, a vulva biopsy demonstrated VIN III. In August 2011, she  underwent wide local excision of a right-sided inferior labia majora  lesion and CO2 ablation of the right labia minora. She denies any  vulvar pruritus or discharge.   Pap 2010, 2011, 2012  2013 wnl.  Patient seen 01/2012.  Bx c/w VIN III.  Patient given trial of Aldara.    INTERVAL HISTORY: She states that it has been difficult to apply Aldara to the external genitalia at the site of the lesions.  Patient requests CO2 laser.     REVIEW OF SYSTEMS: No vulva pruritus, adenopathy, cough, shortness of  breath; vulva or vaginal bleeding. No chest pain, nausea, vomiting, diarrhea, constipation,.  No urinary incontinence, vaginal bleeding or discharge.  Review of other systems non contributory.   Past Surgical Hx:  Past Surgical History  Procedure Date  . Abdominal hysterectomy 1997  . Wide local excision of labia majora 04/2010    Right-sided lesion, CO2 ablation of right labia minora  . Appendectomy     Ruptured, around 15 years ago    Past Medical Hx:  Past Medical History  Diagnosis Date  . Dysplasia     Multifocal genital, VIN and VAIN  . Arthritis   . Hypertension   . Osteoporosis   . History of anal fissures   . Mild depression     Family Hx:  Family History  Problem Relation Age of Onset  . Diabetes Mother   . Hypertension Father   . Lymphoma Son      Vitals:  Blood pressure 140/80, pulse 80, temperature 98 F (36.7 C), temperature source Oral, resp. rate 16, height 5' 3.58" (1.615  m), weight 99 lb 12.8 oz (45.269 kg).  Physical Exam: WD thin female in NAD BP 140/80  Pulse 80  Temp 98 F (36.7 C) (Oral)  Resp 16  Ht 5' 3.58" (1.615 m)  Wt 99 lb 12.8 oz (45.269 kg)  BMI 17.36 kg/m2 CHEST:  CTA HEART: RRR ABD:  Soft, NT no masses LYMPHATICS: No cervical, supraclavicular or inguinal adenopathy.  PELVIC: External genitalia with multifocal lesions on the left labia minora and the right posterior fourchette.   EXT:  No CCE  Assessment/Plan:  This is a 66 y.o. year old with recurrent multifocal vulvar dysplasia. Patient declines to continue the trial of Aldara.  Will proceed with CO2 laser ablation of the external genitalia on 06/22/2012.  The postoperative routine was d/w the patient.     Laurette Schimke, MD., PhD. 05/27/2012, 4:59 PM   .

## 2012-06-10 ENCOUNTER — Ambulatory Visit: Payer: Medicare Other | Admitting: Gynecologic Oncology

## 2012-06-18 ENCOUNTER — Encounter (HOSPITAL_BASED_OUTPATIENT_CLINIC_OR_DEPARTMENT_OTHER): Payer: Self-pay | Admitting: *Deleted

## 2012-06-18 NOTE — Progress Notes (Signed)
NPO AFTER MN. ARRIVES AT 0615. NEEDS ISTAT AND EKG. UNLESS OTHERWISE ORDERED BY NANCY (WHOM IS OUT OF TOWN UNTIL Monday 06-21-2012) WHEN SHE PUTS IN PRE-OP ORDER.  WILL TAKE DILTIAZEM AND PREDNISONE AM OF SURG W/ SIP OF WATER.

## 2012-06-22 ENCOUNTER — Encounter (HOSPITAL_COMMUNITY): Payer: Self-pay | Admitting: *Deleted

## 2012-06-22 ENCOUNTER — Encounter (HOSPITAL_BASED_OUTPATIENT_CLINIC_OR_DEPARTMENT_OTHER): Payer: Self-pay | Admitting: Anesthesiology

## 2012-06-22 ENCOUNTER — Ambulatory Visit (HOSPITAL_COMMUNITY): Payer: Medicare Other

## 2012-06-22 ENCOUNTER — Other Ambulatory Visit: Payer: Self-pay

## 2012-06-22 ENCOUNTER — Emergency Department (HOSPITAL_COMMUNITY)
Admission: EM | Admit: 2012-06-22 | Discharge: 2012-06-22 | Discharge status: Absent without leave | Payer: Medicare Other

## 2012-06-22 ENCOUNTER — Encounter (HOSPITAL_COMMUNITY)
Admission: RE | Disposition: A | Discharge status: Home / Self Care | Payer: Self-pay | Source: Ambulatory Visit | Attending: Internal Medicine

## 2012-06-22 ENCOUNTER — Encounter (HOSPITAL_COMMUNITY): Payer: Self-pay | Admitting: Emergency Medicine

## 2012-06-22 ENCOUNTER — Inpatient Hospital Stay (HOSPITAL_BASED_OUTPATIENT_CLINIC_OR_DEPARTMENT_OTHER)
Admission: RE | Admit: 2012-06-22 | Discharge: 2012-06-23 | DRG: 313 | Disposition: A | Discharge status: Home / Self Care | Payer: Medicare Other | Source: Ambulatory Visit | Attending: Internal Medicine | Admitting: Internal Medicine

## 2012-06-22 ENCOUNTER — Encounter (HOSPITAL_BASED_OUTPATIENT_CLINIC_OR_DEPARTMENT_OTHER): Payer: Self-pay | Admitting: *Deleted

## 2012-06-22 DIAGNOSIS — J4489 Other specified chronic obstructive pulmonary disease: Secondary | ICD-10-CM | POA: Diagnosis present

## 2012-06-22 DIAGNOSIS — M069 Rheumatoid arthritis, unspecified: Secondary | ICD-10-CM | POA: Diagnosis present

## 2012-06-22 DIAGNOSIS — Z681 Body mass index (BMI) 19 or less, adult: Secondary | ICD-10-CM

## 2012-06-22 DIAGNOSIS — R079 Chest pain, unspecified: Principal | ICD-10-CM | POA: Diagnosis present

## 2012-06-22 DIAGNOSIS — D649 Anemia, unspecified: Secondary | ICD-10-CM | POA: Diagnosis present

## 2012-06-22 DIAGNOSIS — R0989 Other specified symptoms and signs involving the circulatory and respiratory systems: Secondary | ICD-10-CM

## 2012-06-22 DIAGNOSIS — F17200 Nicotine dependence, unspecified, uncomplicated: Secondary | ICD-10-CM | POA: Diagnosis present

## 2012-06-22 DIAGNOSIS — I447 Left bundle-branch block, unspecified: Secondary | ICD-10-CM | POA: Diagnosis present

## 2012-06-22 DIAGNOSIS — M810 Age-related osteoporosis without current pathological fracture: Secondary | ICD-10-CM | POA: Diagnosis present

## 2012-06-22 DIAGNOSIS — R9431 Abnormal electrocardiogram [ECG] [EKG]: Secondary | ICD-10-CM

## 2012-06-22 DIAGNOSIS — I10 Essential (primary) hypertension: Secondary | ICD-10-CM | POA: Diagnosis present

## 2012-06-22 DIAGNOSIS — D071 Carcinoma in situ of vulva: Secondary | ICD-10-CM | POA: Diagnosis present

## 2012-06-22 DIAGNOSIS — J449 Chronic obstructive pulmonary disease, unspecified: Secondary | ICD-10-CM | POA: Diagnosis present

## 2012-06-22 DIAGNOSIS — R0789 Other chest pain: Secondary | ICD-10-CM | POA: Diagnosis present

## 2012-06-22 DIAGNOSIS — R0600 Dyspnea, unspecified: Secondary | ICD-10-CM

## 2012-06-22 DIAGNOSIS — I313 Pericardial effusion (noninflammatory): Secondary | ICD-10-CM

## 2012-06-22 HISTORY — DX: Other skin changes: R23.8

## 2012-06-22 HISTORY — DX: Simple chronic bronchitis: J41.0

## 2012-06-22 HISTORY — DX: Spontaneous ecchymoses: R23.3

## 2012-06-22 HISTORY — DX: Nocturia: R35.1

## 2012-06-22 LAB — COMPREHENSIVE METABOLIC PANEL
ALT: 14 U/L (ref 0–35)
AST: 22 U/L (ref 0–37)
Alkaline Phosphatase: 36 U/L — ABNORMAL LOW (ref 39–117)
Calcium: 8.8 mg/dL (ref 8.4–10.5)
Potassium: 3.4 mEq/L — ABNORMAL LOW (ref 3.5–5.1)
Sodium: 128 mEq/L — ABNORMAL LOW (ref 135–145)
Total Protein: 6.9 g/dL (ref 6.0–8.3)

## 2012-06-22 LAB — URINE MICROSCOPIC-ADD ON

## 2012-06-22 LAB — URINALYSIS, ROUTINE W REFLEX MICROSCOPIC
Nitrite: POSITIVE — AB
Protein, ur: NEGATIVE mg/dL
pH: 5.5 (ref 5.0–8.0)

## 2012-06-22 LAB — BASIC METABOLIC PANEL
Calcium: 9.3 mg/dL (ref 8.4–10.5)
GFR calc Af Amer: >90 mL/min (ref >90)
GFR calc non Af Amer: 89 mL/min — ABNORMAL LOW (ref >90)
Potassium: 4.3 mEq/L (ref 3.5–5.1)
Sodium: 131 mEq/L — ABNORMAL LOW (ref 135–145)

## 2012-06-22 LAB — MAGNESIUM
Magnesium: 1.9 mg/dL (ref 1.5–2.5)
Magnesium: 1.9 mg/dL (ref 1.5–2.5)

## 2012-06-22 LAB — PROTIME-INR
INR: 0.96 (ref 0.00–1.49)
Prothrombin Time: 12.7 seconds (ref 11.6–15.2)

## 2012-06-22 LAB — CBC WITH DIFFERENTIAL/PLATELET
Eosinophils Absolute: 0 10*3/uL (ref 0.0–0.7)
Lymphs Abs: 1.2 10*3/uL (ref 0.7–4.0)
MCH: 37.6 pg — ABNORMAL HIGH (ref 26.0–34.0)
Neutro Abs: 4.1 10*3/uL (ref 1.7–7.7)
Neutrophils Relative %: 73 % (ref 43–77)
Platelets: 212 10*3/uL (ref 150–400)
RBC: 3.11 MIL/uL — ABNORMAL LOW (ref 3.87–5.11)
WBC: 5.6 10*3/uL (ref 4.0–10.5)

## 2012-06-22 LAB — POCT I-STAT 4, (NA,K, GLUC, HGB,HCT)
Glucose, Bld: 93 mg/dL (ref 70–99)
HCT: 36 % (ref 36.0–46.0)
Hemoglobin: 12.2 g/dL (ref 12.0–15.0)
Potassium: 4.1 mEq/L (ref 3.5–5.1)

## 2012-06-22 LAB — TROPONIN I
Troponin I: <0.3 ng/mL (ref <0.30)
Troponin I: <0.3 ng/mL (ref <0.30)

## 2012-06-22 SURGERY — CO2 LASER APPLICATION
Anesthesia: General

## 2012-06-22 MED ORDER — ASPIRIN 81 MG PO CHEW: 81.0000 mg | CHEWABLE_TABLET | Freq: Every day | ORAL | Status: DC

## 2012-06-22 MED ORDER — VITAMIN D 1000 UNITS PO TABS
1000.0000 [IU] | ORAL_TABLET | Freq: Every day | ORAL | Status: DC
  Administered 2012-06-22 – 2012-06-23 (×2): 1000 [IU] via ORAL
  Filled 2012-06-22 (×2): qty 1

## 2012-06-22 MED ORDER — ONDANSETRON HCL 4 MG/2ML IJ SOLN: 4.0000 mg | Freq: Four times a day (QID) | INTRAMUSCULAR | Status: DC | PRN

## 2012-06-22 MED ORDER — ACETAMINOPHEN 325 MG PO TABS
650.0000 mg | ORAL_TABLET | ORAL | Status: DC | PRN
  Administered 2012-06-23: 650 mg via ORAL

## 2012-06-22 MED ORDER — ASPIRIN 81 MG PO CHEW
324.0000 mg | CHEWABLE_TABLET | Freq: Once | ORAL | Status: AC
  Administered 2012-06-22: 324 mg via ORAL
  Filled 2012-06-22: qty 4

## 2012-06-22 MED ORDER — LACTATED RINGERS IV SOLN
INTRAVENOUS | Status: DC
  Administered 2012-06-22: 100 mL/h via INTRAVENOUS

## 2012-06-22 MED ORDER — IOHEXOL 350 MG/ML SOLN
100.0000 mL | Freq: Once | INTRAVENOUS | Status: AC | PRN
  Administered 2012-06-22: 100 mL via INTRAVENOUS

## 2012-06-22 MED ORDER — ALENDRONATE SODIUM 70 MG PO TABS: 70.0000 mg | ORAL_TABLET | ORAL | Status: DC

## 2012-06-22 MED ORDER — SODIUM CHLORIDE 0.9 % IV SOLN
INTRAVENOUS | Status: DC
  Administered 2012-06-22 – 2012-06-23 (×2): via INTRAVENOUS

## 2012-06-22 MED ORDER — ASPIRIN EC 81 MG PO TBEC
81.0000 mg | DELAYED_RELEASE_TABLET | Freq: Every day | ORAL | Status: DC
  Administered 2012-06-23: 81 mg via ORAL
  Filled 2012-06-22: qty 1

## 2012-06-22 MED ORDER — NITROGLYCERIN 0.4 MG SL SUBL: 0.4000 mg | SUBLINGUAL_TABLET | SUBLINGUAL | Status: DC | PRN

## 2012-06-22 MED ORDER — ETANERCEPT 50 MG/ML ~~LOC~~ SOLN: 50.0000 mg | SUBCUTANEOUS | Status: DC

## 2012-06-22 MED ORDER — LYSINE 500 MG PO TABS
1.0000 | ORAL_TABLET | Freq: Two times a day (BID) | ORAL | Status: DC
  Filled 2012-06-22: qty 1

## 2012-06-22 MED ORDER — IMIQUIMOD 5 % EX CREA: TOPICAL_CREAM | CUTANEOUS | Status: DC

## 2012-06-22 MED ORDER — ASPIRIN 300 MG RE SUPP
300.0000 mg | RECTAL | Status: DC
  Filled 2012-06-22: qty 1

## 2012-06-22 MED ORDER — PREDNISONE 2.5 MG PO TABS
2.5000 mg | ORAL_TABLET | Freq: Every day | ORAL | Status: DC
  Administered 2012-06-23: 2.5 mg via ORAL
  Filled 2012-06-22 (×2): qty 1

## 2012-06-22 MED ORDER — IRBESARTAN 150 MG PO TABS
150.0000 mg | ORAL_TABLET | Freq: Every day | ORAL | Status: DC
  Administered 2012-06-23: 150 mg via ORAL
  Filled 2012-06-22 (×2): qty 1

## 2012-06-22 MED ORDER — DILTIAZEM HCL ER BEADS 120 MG PO CP24
360.0000 mg | ORAL_CAPSULE | Freq: Every morning | ORAL | Status: DC
  Filled 2012-06-22: qty 1

## 2012-06-22 MED ORDER — ENOXAPARIN SODIUM 30 MG/0.3ML ~~LOC~~ SOLN
30.0000 mg | SUBCUTANEOUS | Status: DC
  Administered 2012-06-22: 30 mg via SUBCUTANEOUS
  Filled 2012-06-22 (×2): qty 0.3

## 2012-06-22 MED ORDER — ASPIRIN 81 MG PO CHEW
324.0000 mg | CHEWABLE_TABLET | ORAL | Status: DC
  Filled 2012-06-22: qty 4

## 2012-06-22 SURGICAL SUPPLY — 10 items
DRAPE LG THREE QUARTER DISP (DRAPES) IMPLANT
ELECT BALL LEEP 3MM BLK (ELECTRODE) IMPLANT
ELECT BALL LEEP 5MM RED (ELECTRODE) IMPLANT
ELECT NEEDLE TIP 2.8 STRL (NEEDLE) IMPLANT
ELECT REM PT RETURN 9FT ADLT (ELECTROSURGICAL)
ELECTRODE REM PT RTRN 9FT ADLT (ELECTROSURGICAL) IMPLANT
LEGGING LITHOTOMY PAIR STRL (DRAPES) IMPLANT
NS IRRIG 500ML POUR BTL (IV SOLUTION) IMPLANT
PENCIL BUTTON HOLSTER BLD 10FT (ELECTRODE) IMPLANT
YANKAUER SUCT BULB TIP NO VENT (SUCTIONS) IMPLANT

## 2012-06-22 NOTE — ED Notes (Signed)
[id number]<GM> Expected date:<BR> Expected time:<BR> Means of arrival:<BR> Comments:<BR> Regina Ortiz (pt is in 15) Chest pain

## 2012-06-22 NOTE — H&P (Signed)
Patient presented today and noted to have EEG findings c/w new left bundle branch block.  Case cancelled and patient requested to f/u with PCP for further evaluation.

## 2012-06-22 NOTE — ED Notes (Signed)
Pt was at surgery center being prepared for a "dysplasia of the vulva".  While being prepped for surgery, they did an EKG and noticed changes from an EKG done 2 years ago.  The anesthesiologist told the patient that she may have some heart problems.  Pt states that after he told her that, she started having chest pains.  Pain is on Lt chest and is described as an ache 2/10.  Pt states that pain is better now and it "only hurt bad for a couple of minutes".  Pain then was a 4/10.  Pt is A&O x 4.  Husband states that pt has c/o chest pains before "when she is worked up about something".

## 2012-06-22 NOTE — Progress Notes (Signed)
PHARMACIST - PHYSICIAN ORDER COMMUNICATION  CONCERNING: P&T Medication Policy on Herbal Medications  DESCRIPTION:  This patient's order ZOX:WRUEAV 500mg  has been noted.  This product(s) is classified as an "herbal" or natural product. Due to a lack of definitive safety studies or FDA approval, nonstandard manufacturing practices, plus the potential risk of unknown drug-drug interactions while on inpatient medications, the Pharmacy and Therapeutics Committee does not permit the use of "herbal" or natural products of this type within [facility]   ACTION TAKEN: The pharmacy department is unable to verify this order at this time and your patient has been informed of this safety policy. Please reevaluate patient's clinical condition at discharge and address if the herbal or natural product(s) should be resumed at that time.

## 2012-06-22 NOTE — ED Notes (Signed)
Attempted to call report.  Floor states that the room assigned is not clean.

## 2012-06-22 NOTE — Progress Notes (Signed)
Pt transferred by wheelchair to ED for further evaluation.  Report to [facility] RN.

## 2012-06-22 NOTE — ED Notes (Signed)
Pt from surgery center.  Was being prepped to have a vulvar dysplasia fixed. Had an EKG done that showed changes from EKG done 2 years ago.  Anesthesiologist told pt that she may have heart problems.  Pt began c/o chest pain after she was told this.  Stated that pain only lasted a couple of minutes and was a 4/10.  Pain Lt chest described as an ache.  Now pain is 2/10.  Husband stated that pt has c/o chest pain similar to this before "when she was worked up about something".  Surgery was cancelled and pt was sent here for evaluation.

## 2012-06-22 NOTE — Consult Note (Signed)
CARDIOLOGY CONSULT NOTE    Patient ID: Regina Ortiz MRN: [id number] DOB/AGE: 08-Mar-1946 66 y.o.  Admit date: 06/22/2012 Referring Physician:  Elisabeth Pigeon Primary Physician: Carilyn Goodpasture, PA Primary Cardiologist:  New Reason for Consultation:   Abnormal ECG  Principal Problem:  *Chest pain Active Problems:  Anemia   HPI:   66 yo smoker.  Was to have OB/Gyn surgery today.  Preop ECG should LBBB with long PR.  Compared to ECG from 2011 ( both reviewed) both findings new as ECG then was essentially normal.  She told anesthesia she had been having chest pain and dyspnea and so she was admitted by hospitalist  She is a smoker CXR with COPD.  Denies cough fever or sputum.  No previous cardiac abnormality.  No CE on CXR.  Chest pain fleeting and not always exertional Related to dyspnea Dyspnea is with exertion and stress.  No PND orthopnea or edema No history of DCM or CHF.  Denies syncope or palpitations.  K is fine Troponin negative  Currently sitting in ER asymptomatic and just disappointed that surgery not done  @ROS @ All other systems reviewed and negative except as noted above  Past Medical History  Diagnosis Date  . Dysplasia     Multifocal genital, VIN and VAIN  . Arthritis   . Hypertension   . Osteoporosis   . History of anal fissures   . Mild depression   . RA (rheumatoid arthritis)   . Bruises easily   . Nocturia   . Smokers' cough     Family History  Problem Relation Age of Onset  . Diabetes Mother   . Hypertension Father   . Lymphoma Son     History   Social History  . Marital Status: Married    Spouse Name: N/A    Number of Children: N/A  . Years of Education: N/A   Occupational History  . Not on file.   Social History Main Topics  . Smoking status: Current Every Day Smoker -- 0.5 packs/day for 50 years    Types: Cigarettes  . Smokeless tobacco: Never Used  . Alcohol Use: 7.2 oz/week    12 Cans of beer per week     12-15 beers a week  . Drug Use:  No  . Sexually Active: Not on file   Other Topics Concern  . Not on file   Social History Narrative  . No narrative on file    Past Surgical History  Procedure Date  . Wide local excision of labia majora 04-04-2010    Right-sided lesion, CO2 ablation of right labia minora  . Excision vulvar lesions   . Appendectomy 1992  . Vaginal hysterectomy 1997        . aspirin  324 mg Oral Once      . sodium chloride 75 mL/hr at 06/22/12 1108  . DISCONTD: lactated ringers Stopped (06/22/12 1053)    Physical Exam:   Affect appropriate Healthy:  appears stated age HEENT: normal Neck supple with no adenopathy JVP normal no bruits no thyromegaly Lungs clear with no wheezing and good diaphragmatic motion Heart:  S1/S2 no murmur, no rub, gallop or click PMI normal Abdomen: benighn, BS positve, no tenderness, no AAA no bruit.  No HSM or HJR Distal pulses intact with no bruits No edema Neuro non-focal Skin warm and dry No muscular weakness   Labs:   Lab Results  Component Value Date   WBC 5.6 06/22/2012   HGB 11.7* 06/22/2012   HCT  31.8* 06/22/2012   MCV 102.3* 06/22/2012   PLT 212 06/22/2012    Lab 06/22/12 1030  NA 131*  K 4.3  CL 99  CO2 19  BUN 9  CREATININE 0.68  CALCIUM 9.3  PROT --  BILITOT --  ALKPHOS --  ALT --  AST --  GLUCOSE 100*   Lab Results  Component Value Date   TROPONINI <0.30 06/22/2012       Radiology: Ct Angio Chest Pe W/cm &/or Wo Cm  06/22/2012  *RADIOLOGY REPORT*  Clinical Data: Shortness of breath, rule out pulmonary embolus  CT ANGIOGRAPHY CHEST  Technique:  Multidetector CT imaging of the chest using the standard protocol during bolus administration of intravenous contrast. Multiplanar reconstructed images including MIPs were obtained and reviewed to evaluate the vascular anatomy.  Contrast: OMNIPAQUE IOHEXOL 350 MG/ML SOLN  Comparison: None.  Findings: Images of the thoracic inlet are unremarkable.  Central airways are  patent.  Atherosclerotic calcifications of the thoracic aorta.  Atherosclerotic calcifications of the coronary arteries.  Heart size is within normal limits.  Small anterior pericardial effusion measures 4 mm maximum thickness.  The visualized upper abdomen is unremarkable.  The study is excellent technical quality.  No pulmonary embolus is noted. No hilar or mediastinal adenopathy.  No axillary adenopathy.  Images of the lung parenchyma shows no acute infiltrate or pulmonary edema.  Mild centrilobular emphysematous changes are noted bilateral upper lobe.  Bilateral apical scarring.  Bullous emphysematous changes are noted right middle lobe and right base medially.  No pleural effusion is noted.  No destructive bony lesions are noted.  Sagittal images of the spine shows degenerative changes thoracic spine.  IMPRESSION:  1.  No pulmonary embolus. 2.  No adenopathy. 3.  No acute infiltrate or pulmonary edema. 4.  Bilateral emphysematous changes.  Bilateral apical pleuroparenchymal scarring. 5.  Small anterior pericardial effusion.   Original Report Authenticated By: Natasha Mead, M.D.    Dg Chest Port 1 View  06/22/2012  *RADIOLOGY REPORT*  Clinical Data: Preop  PORTABLE CHEST - 1 VIEW  Comparison: 12/28/2008  Findings: Cardiomediastinal silhouette is stable.  Mild hyperinflation.  No acute infiltrate or pulmonary edema.  Mild degenerative changes mid thoracic spine.  IMPRESSION: Mild hyperinflation.  No active disease.   Original Report Authenticated By: Natasha Mead, M.D.     EKG:  SR rate 75  LBBB QRS and QT 440 msec.  First degree block PR 124 msec   ASSESSMENT AND PLAN:  LBBB:  Fairly significant change on ECG compared to 2011. In setting of dyspnea and atypical chest pain will need echo and adenosine myovue.  Discussed both procedures with patient and husband willing to proceed  Will have to  Watch closely for AV block during adenosine infusion and discussed this with patient.  Also discussed need  for long term F/U and potential pacer in future Chest Pain:  Atypical Troponin normal with LBBB and need for surgery will proceed with adenosine myovue Dyspnea:  May be related to smoking and COPD  Echo to assess RV and LV function HTN:  Stop cardizem given conduction abnormalities.  Continue ARB  If additional med needed consider diuretic or hydralazine  Signed: [name] 06/22/2012, 4:00 PM

## 2012-06-22 NOTE — Progress Notes (Signed)
Anesthesia Note  Pt presented today for CO2 laser excision of vulvar dysplasia. Upon review of preop ECG a prominent LBBB was present on current ECG. Comparison of old (04/2010) ECG showed prior with sinus rhythm. I discussed these findings with Dr. Nelly Rout and the patient in recommending further evaluation. Pt describes vague chest discomfort/reflux symptoms at home and DOE. No jaw or arm pain. Will send her to ED here for further workup.

## 2012-06-22 NOTE — OR Nursing (Signed)
Case cancelled by Dr. Renold Don.

## 2012-06-22 NOTE — Anesthesia Preprocedure Evaluation (Addendum)
Anesthesia Evaluation  Patient identified by MRN, date of birth, ID band Patient awake    Reviewed: Allergy & Precautions, H&P , NPO status , Patient's Chart, lab work & pertinent test results  Airway       Dental  (+) Dental Advisory Given   Pulmonary Current Smoker,          Cardiovascular hypertension, Pt. on medications - CAD and - Past MI     Neuro/Psych PSYCHIATRIC DISORDERS Depression    GI/Hepatic negative GI ROS, Neg liver ROS,   Endo/Other  negative endocrine ROS  Renal/GU negative Renal ROS     Musculoskeletal  (+) Arthritis -, Rheumatoid disorders,    Abdominal   Peds  Hematology negative hematology ROS (+)   Anesthesia Other Findings   Reproductive/Obstetrics                          Anesthesia Physical Anesthesia Plan  ASA: II  Anesthesia Plan: General   Post-op Pain Management:    Induction: Intravenous  Airway Management Planned: LMA  Additional Equipment:   Intra-op Plan:   Post-operative Plan: Extubation in OR  Informed Consent: I have reviewed the patients History and Physical, chart, labs and discussed the procedure including the risks, benefits and alternatives for the proposed anesthesia with the patient or authorized representative who has indicated his/her understanding and acceptance.   Dental advisory given  Plan Discussed with: CRNA  Anesthesia Plan Comments:         Anesthesia Quick Evaluation

## 2012-06-22 NOTE — H&P (Signed)
[facility] History and Physical  TREONNA KLEE ZOX:[id number] DOB: 1945/10/18 DOA: 06/22/2012  Referring physician: ER physician PCP: Carilyn Goodpasture, PA   Chief Complaint: chest pain  HPI:  66 year old female with history of rheumatoid arthritis, hypertension and active smoking who was sent to ED after EKG showed LBBB. Patient had pre op evaluation for CO2 laser excision of vulvar dysplasia and her pre op EKG showed LBBB which was new compared to previous EKG's. Since patient is a smoker, she was sent to ED for further evaluation. Patient reported she has had on and off chest pains for some time but never really felt it bothered her. Her pain would mostly be located in left sternal area, non reproducible, lasting for short time only. Patient does report associated shortness of breath on exertion but is able to ambulate. No abdominal pain, no nausea or vomiting. No lightheadedness or loss of consciousness. No fever or chills.  Assessment and Plan:  Principal Problem:  *Chest pain - concerning for acute coronary syndrome - patient has LBBB on admission EKG - troponin x 1 set is negative - appreciate cardiology consult (per their recommendations patient will need ECHO and adenosine Myoview) - we will cycle cardiac enzymes - will discontinue Cardizem and we will continue ARB and if needed diuretic and hydralazine for better BP control   Code Status: Full Family Communication: Pt at bedside Disposition Plan: Admit for further evaluation to telemetry  Manson Passey, MD  [facility] Pager [phone]  If 7PM-7AM, please contact night-coverage [email or web address] Password [email or web address] 06/22/2012, 3:19 PM  Review of Systems:  Constitutional: Negative for fever, chills and malaise/fatigue. Negative for diaphoresis.  HENT: Negative for hearing loss, ear pain, nosebleeds, congestion, sore throat, neck pain, tinnitus and ear discharge.   Eyes: Negative for blurred vision, double vision, photophobia, pain,  discharge and redness.  Respiratory: Negative for cough, hemoptysis, sputum production, shortness of breath, wheezing and stridor.   Cardiovascular: per HPI.  Gastrointestinal: Negative for nausea, vomiting and abdominal pain. Negative for heartburn, constipation, blood in stool and melena.  Genitourinary: Negative for dysuria, urgency, frequency, hematuria and flank pain.  Musculoskeletal: Negative for myalgias, back pain, joint pain and falls.  Skin: Negative for itching and rash.  Neurological: Negative for dizziness and weakness. Negative for tingling, tremors, sensory change, speech change, focal weakness, loss of consciousness and headaches.  Endo/Heme/Allergies: Negative for environmental allergies and polydipsia. Does not bruise/bleed easily.  Psychiatric/Behavioral: Negative for suicidal ideas. The patient is not nervous/anxious.      Past Medical History  Diagnosis Date  . Dysplasia     Multifocal genital, VIN and VAIN  . Arthritis   . Hypertension   . Osteoporosis   . History of anal fissures   . Mild depression   . RA (rheumatoid arthritis)   . Bruises easily   . Nocturia   . Smokers' cough    Past Surgical History  Procedure Date  . Wide local excision of labia majora 04-04-2010    Right-sided lesion, CO2 ablation of right labia minora  . Excision vulvar lesions   . Appendectomy 1992  . Vaginal hysterectomy 1997   Social History:  reports that she has been smoking Cigarettes.  She has a 25 pack-year smoking history. She has never used smokeless tobacco. She reports that she drinks about 7.2 ounces of alcohol per week. She reports that she does not use illicit drugs.  Allergies  Allergen Reactions  . Latex Swelling    In the  Past, mouth  has become swollen and covered with fever blisters. This happened after a visit at the dentist that used Latex gloves    Family History: HTN in mother  Prior to Admission medications   Medication Sig Start Date End Date  Taking? Authorizing Provider  alendronate (FOSAMAX) 70 MG tablet Take 70 mg by mouth every 7 (seven) days. Take with a full glass of water on an empty stomach.   Yes Historical Provider, MD  aspirin 81 MG tablet Take 81 mg by mouth daily.   Yes Historical Provider, MD  Calcium Carbonate-Vitamin D (CALTRATE 600+D PO) Take by mouth daily.   Yes Historical Provider, MD  Cholecalciferol (VITAMIN D3) 1000 UNITS CAPS Take by mouth daily.   Yes Historical Provider, MD  diltiazem (TIAZAC) 360 MG 24 hr capsule Take 360 mg by mouth every morning.    Yes Historical Provider, MD  etanercept (ENBREL) 50 MG/ML injection Inject 50 mg into the skin once a week.   Yes Historical Provider, MD  imiquimod (ALDARA) 5 % cream Apply topically 3 (three) times a week.   Yes Historical Provider, MD  Lysine 500 MG TABS Take 1 tablet by mouth 2 (two) times daily.   Yes Historical Provider, MD  PREDNISONE PO Take 2.5 mg by mouth every morning.    Yes Historical Provider, MD  PRENATAL VITAMINS PO Take 1 tablet by mouth daily.   Yes Historical Provider, MD  valsartan (DIOVAN) 160 MG tablet Take 160 mg by mouth every morning.    Yes Historical Provider, MD   Physical Exam: Filed Vitals:   06/18/12 1404 06/22/12 0610 06/22/12 0947  BP:  153/74 130/58  Pulse:  83 76  Temp:  97.6 F (36.4 C) 98.9 F (37.2 C)  TempSrc:  Oral Oral  Resp:  16 16  Height: 5' 3.5" (1.613 m)    Weight: 45.36 kg (100 lb) 45.36 kg (100 lb)   SpO2:  100% 100%    Physical Exam  Constitutional: Appears well-developed and well-nourished. No distress.  HENT: Normocephalic. External right and left ear normal. Oropharynx is clear and moist.  Eyes: Conjunctivae and EOM are normal. PERRLA, no scleral icterus.  Neck: Normal ROM. Neck supple. No JVD. No tracheal deviation. No thyromegaly.  CVS: RRR, S1/S2 +, no murmurs, no gallops, no carotid bruit.  Pulmonary: Effort and breath sounds normal, no stridor, rhonchi, wheezes, rales.  Abdominal: Soft. BS  +,  no distension, tenderness, rebound or guarding.  Musculoskeletal: Normal range of motion. No edema and no tenderness.  Lymphadenopathy: No lymphadenopathy noted, cervical, inguinal. Neuro: Alert. Normal reflexes, muscle tone coordination. No cranial nerve deficit. Skin: Skin is warm and dry. No rash noted. Not diaphoretic. No erythema. No pallor.  Psychiatric: Normal mood and affect. Behavior, judgment, thought content normal.   Labs on Admission:  Basic Metabolic Panel:  Lab 06/22/12 4540 06/22/12 0643  NA 131* 132*  K 4.3 4.1  CL 99 --  CO2 19 --  GLUCOSE 100* 93  BUN 9 --  CREATININE 0.68 --  CALCIUM 9.3 --  MG 1.9 --   CBC:  Lab 06/22/12 1030 06/22/12 0643  WBC 5.6 --  HGB 11.7* 12.2  HCT 31.8* 36.0  MCV 102.3* --  PLT 212 --   Cardiac Enzymes:  Lab 06/22/12 1030  TROPONINI <0.30    Radiological Exams on Admission: Ct Angio Chest Pe W/cm &/or Wo Cm 06/22/2012  IMPRESSION:  1.  No pulmonary embolus. 2.  No adenopathy. 3.  No acute infiltrate or  pulmonary edema. 4.  Bilateral emphysematous changes.  Bilateral apical pleuroparenchymal scarring. 5.  Small anterior pericardial effusion.   Original Report Authenticated By: Natasha Mead, M.D.    Dg Chest Port 1 View 06/22/2012   IMPRESSION: Mild hyperinflation.  No active disease.   Original Report Authenticated By: Natasha Mead, M.D.     Time spent: 75 minutes

## 2012-06-22 NOTE — Progress Notes (Signed)
PHARMACIST - PHYSICIAN COMMUNICATION  CONCERNING: P&T Medication Policy Regarding Oral Bisphosphonates  RECOMMENDATION: Your order for alendronate (Fosamax),has been discontinued at this time.  If the patient's post-hospital medical condition warrants safe use of this class of drugs, please resume the pre-hospital regimen upon discharge.  DESCRIPTION:  Alendronate (Fosamax) can cause severe esophageal erosions in patients who are unable to remain upright at least 30 minutes after taking this medication.   Since brief interruptions in therapy are thought to have minimal impact on bone mineral density, the Pharmacy & Therapeutics Committee has established that bisphosphonate orders should be routinely discontinued during hospitalization.   To override this safety policy and permit administration of Fosamax in the hospital, prescribers must write "DO NOT HOLD" in the comments section when placing the order for this class of medications.  Thank you, Terrilee Files, PharmD

## 2012-06-22 NOTE — ED Provider Notes (Signed)
History     CSN: [id number]  Arrival date & time 06/22/12  0901   First MD Initiated Contact with Patient 06/22/12 1018      Chief Complaint  Patient presents with  . Chest Pain  . EKG Changes      HPI Pt was seen at 1020.  Per pt, c/o gradual onset and persistence of constant EKG changes for an unknown period of time.  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  Pt was sent to the ED for further eval.  Pt endorses she often has fleeting chest "pains," lasting seconds to minutes at a time, not related to exertion.  Pt states she often has "chest pains when I get upset about something."  Pt states she had this same CP this morning and while being prepped for surgery PTA.  Pt also c/o SOB on exertion for the past 2 years, feels it is worsening.  Denies palpitations, no cough, no abd pain, no N/V/D, no back pain.     Past Medical History  Diagnosis Date  . Dysplasia     Multifocal genital, VIN and VAIN  . Arthritis   . Hypertension   . Osteoporosis   . History of anal fissures   . Mild depression   . RA (rheumatoid arthritis)   . Bruises easily   . Nocturia   . Smokers' cough     Past Surgical History  Procedure Date  . Wide local excision of labia majora 04-04-2010    Right-sided lesion, CO2 ablation of right labia minora  . Excision vulvar lesions   . Appendectomy 1992  . Vaginal hysterectomy 1997    Family History  Problem Relation Age of Onset  . Diabetes Mother   . Hypertension Father   . Lymphoma Son     History  Substance Use Topics  . Smoking status: Current Every Day Smoker -- 0.5 packs/day for 50 years    Types: Cigarettes  . Smokeless tobacco: Never Used  . Alcohol Use: 7.2 oz/week    12 Cans of beer per week     12-15 beers a week     Review of Systems ROS: Statement: All systems negative except as marked or noted in the HPI; Constitutional: Negative for  fever and chills. ; ; Eyes: Negative for eye pain, redness and discharge. ; ; ENMT: Negative for ear pain, hoarseness, nasal congestion, sinus pressure and sore throat. ; ; Cardiovascular: +CP, SOB. Negative for palpitations, diaphoresis and peripheral edema. ; ; Respiratory: Negative for cough, wheezing and stridor. ; ; Gastrointestinal: Negative for nausea, vomiting, diarrhea, abdominal pain, blood in stool, hematemesis, jaundice and rectal bleeding. . ; ; Genitourinary: Negative for dysuria, flank pain and hematuria. ; ; Musculoskeletal: Negative for back pain and neck pain. Negative for swelling and trauma.; ; Skin: Negative for pruritus, rash, abrasions, blisters, bruising and skin lesion.; ; Neuro: Negative for headache, lightheadedness and neck stiffness. Negative for weakness, altered level of consciousness , altered mental status, extremity weakness, paresthesias, involuntary movement, seizure and syncope.      Allergies  Latex  Home Medications   Current Outpatient Rx  Name Route Sig Dispense Refill  . ALENDRONATE SODIUM 70 MG PO TABS Oral Take 70 mg by mouth every 7 (seven) days. Take with a full glass of water on an empty stomach.    . ASPIRIN 81 MG PO TABS Oral Take 81 mg by  mouth daily.    Marland Kitchen CALTRATE 600+D PO Oral Take by mouth daily.    Marland Kitchen VITAMIN D3 1000 UNITS PO CAPS Oral Take by mouth daily.    Marland Kitchen DILTIAZEM HCL ER BEADS 360 MG PO CP24 Oral Take 360 mg by mouth every morning.     Marland Kitchen ETANERCEPT 50 MG/ML [place] SOLN Subcutaneous Inject 50 mg into the skin once a week.    . IMIQUIMOD 5 % EX CREA Topical Apply topically 3 (three) times a week.    Marland Kitchen LYSINE 500 MG PO TABS Oral Take 1 tablet by mouth 2 (two) times daily.    Marland Kitchen PREDNISONE PO Oral Take 2.5 mg by mouth every morning.     Marland Kitchen PRENATAL VITAMINS PO Oral Take 1 tablet by mouth daily.    Marland Kitchen VALSARTAN 160 MG PO TABS Oral Take 160 mg by mouth every morning.       BP 130/58  Pulse 76  Temp 98.9 F (37.2 C) (Oral)  Resp 16  Ht 5'  3.5" (1.613 m)  Wt 100 lb (45.36 kg)  BMI 17.44 kg/m2  SpO2 100%  Physical Exam 1025: Physical examination:  Nursing notes reviewed; Vital signs and O2 SAT reviewed;  Constitutional: Well developed, Well nourished, Well hydrated, In no acute distress; Head:  Normocephalic, atraumatic; Eyes: EOMI, PERRL, No scleral icterus; ENMT: Mouth and pharynx normal, Mucous membranes moist; Neck: Supple, Full range of motion, No lymphadenopathy; Cardiovascular: Regular rate and rhythm, No gallop; Respiratory: Breath sounds clear & equal bilaterally, No wheezes.  Speaking full sentences with ease, Normal respiratory effort/excursion; Chest: Nontender, Movement normal; Abdomen: Soft, Nontender, Nondistended, Normal bowel sounds;; Extremities: Pulses normal, No tenderness, No edema, No calf edema or asymmetry.; Neuro: AA&Ox3, Major CN grossly intact.  Speech clear. No gross focal motor or sensory deficits in extremities.; Skin: Color normal, Warm, Dry.   ED Course  Procedures    MDM  MDM Reviewed: nursing note, vitals and previous chart Reviewed previous: ECG Interpretation: ECG, labs and x-ray    Date: 06/22/2012  Rate: 75  Rhythm: normal sinus rhythm  QRS Axis: left  Intervals: normal  ST/T Wave abnormalities: normal  Conduction Disutrbances:left bundle branch block  Narrative Interpretation:   Old EKG Reviewed: changes noted; new LBBB compared to previous EKG dated 04/04/2010.    Results for orders placed during the hospital encounter of 06/22/12  POCT I-STAT 4, (NA,K, GLUC, HGB,HCT)      Component Value Range   Sodium 132 (*) 135 - 145 mEq/L   Potassium 4.1  3.5 - 5.1 mEq/L   Glucose, Bld 93  70 - 99 mg/dL   HCT 16.1  09.6 - 04.5 %   Hemoglobin 12.2  12.0 - 15.0 g/dL  BASIC METABOLIC PANEL      Component Value Range   Sodium 131 (*) 135 - 145 mEq/L   Potassium 4.3  3.5 - 5.1 mEq/L   Chloride 99  96 - 112 mEq/L   CO2 19  19 - 32 mEq/L   Glucose, Bld 100 (*) 70 - 99 mg/dL   BUN 9  6 -  23 mg/dL   Creatinine, Ser 4.09  0.50 - 1.10 mg/dL   Calcium 9.3  8.4 - 81.1 mg/dL   GFR calc non Af Amer 89 (*) >90 mL/min   GFR calc Af Amer >90  >90 mL/min  CBC WITH DIFFERENTIAL      Component Value Range   WBC 5.6  4.0 - 10.5 K/uL   RBC 3.11 (*)  3.87 - 5.11 MIL/uL   Hemoglobin 11.7 (*) 12.0 - 15.0 g/dL   HCT 16.1 (*) 09.6 - 04.5 %   MCV 102.3 (*) 78.0 - 100.0 fL   MCH 37.6 (*) 26.0 - 34.0 pg   MCHC 36.8 (*) 30.0 - 36.0 g/dL   RDW 40.9  81.1 - 91.4 %   Platelets 212  150 - 400 K/uL   Neutrophils Relative 73  43 - 77 %   Neutro Abs 4.1  1.7 - 7.7 K/uL   Lymphocytes Relative 21  12 - 46 %   Lymphs Abs 1.2  0.7 - 4.0 K/uL   Monocytes Relative 6  3 - 12 %   Monocytes Absolute 0.3  0.1 - 1.0 K/uL   Eosinophils Relative 0  0 - 5 %   Eosinophils Absolute 0.0  0.0 - 0.7 K/uL   Basophils Relative 0  0 - 1 %   Basophils Absolute 0.0  0.0 - 0.1 K/uL  TROPONIN I      Component Value Range   Troponin I <0.30  <0.30 ng/mL  URINALYSIS, ROUTINE W REFLEX MICROSCOPIC      Component Value Range   Color, Urine YELLOW  YELLOW   APPearance CLEAR  CLEAR   Specific Gravity, Urine 1.005  1.005 - 1.030   pH 5.5  5.0 - 8.0   Glucose, UA NEGATIVE  NEGATIVE mg/dL   Hgb urine dipstick TRACE (*) NEGATIVE   Bilirubin Urine NEGATIVE  NEGATIVE   Ketones, ur TRACE (*) NEGATIVE mg/dL   Protein, ur NEGATIVE  NEGATIVE mg/dL   Urobilinogen, UA 0.2  0.0 - 1.0 mg/dL   Nitrite POSITIVE (*) NEGATIVE   Leukocytes, UA TRACE (*) NEGATIVE  MAGNESIUM      Component Value Range   Magnesium 1.9  1.5 - 2.5 mg/dL  D-DIMER, QUANTITATIVE      Component Value Range   D-Dimer, Quant 1.11 (*) 0.00 - 0.48 ug/mL-FEU  URINE MICROSCOPIC-ADD ON      Component Value Range   Squamous Epithelial / LPF RARE  RARE   WBC, UA 3-6  <3 WBC/hpf   RBC / HPF 0-2  <3 RBC/hpf   Bacteria, UA MANY (*) RARE   Casts GRANULAR CAST (*) NEGATIVE   Ct Angio Chest Pe W/cm &/or Wo Cm 06/22/2012  *RADIOLOGY REPORT*  Clinical Data: Shortness  of breath, rule out pulmonary embolus  CT ANGIOGRAPHY CHEST  Technique:  Multidetector CT imaging of the chest using the standard protocol during bolus administration of intravenous contrast. Multiplanar reconstructed images including MIPs were obtained and reviewed to evaluate the vascular anatomy.  Contrast: OMNIPAQUE IOHEXOL 350 MG/ML SOLN  Comparison: None.  Findings: Images of the thoracic inlet are unremarkable.  Central airways are patent.  Atherosclerotic calcifications of the thoracic aorta.  Atherosclerotic calcifications of the coronary arteries.  Heart size is within normal limits.  Small anterior pericardial effusion measures 4 mm maximum thickness.  The visualized upper abdomen is unremarkable.  The study is excellent technical quality.  No pulmonary embolus is noted. No hilar or mediastinal adenopathy.  No axillary adenopathy.  Images of the lung parenchyma shows no acute infiltrate or pulmonary edema.  Mild centrilobular emphysematous changes are noted bilateral upper lobe.  Bilateral apical scarring.  Bullous emphysematous changes are noted right middle lobe and right base medially.  No pleural effusion is noted.  No destructive bony lesions are noted.  Sagittal images of the spine shows degenerative changes thoracic spine.  IMPRESSION:  1.  No pulmonary embolus. 2.  No adenopathy. 3.  No acute infiltrate or pulmonary edema. 4.  Bilateral emphysematous changes.  Bilateral apical pleuroparenchymal scarring. 5.  Small anterior pericardial effusion.   Original Report Authenticated By: Natasha Mead, M.D.    Dg Chest Port 1 View 06/22/2012  *RADIOLOGY REPORT*  Clinical Data: Preop  PORTABLE CHEST - 1 VIEW  Comparison: 12/28/2008  Findings: Cardiomediastinal silhouette is stable.  Mild hyperinflation.  No acute infiltrate or pulmonary edema.  Mild degenerative changes mid thoracic spine.  IMPRESSION: Mild hyperinflation.  No active disease.   Original Report Authenticated By: Natasha Mead, M.D.        1450:  Denies CP or SOB while in the ED.  Dx and testing d/w pt and family.  Questions answered.  Verb understanding, agreeable to admit.  T/C to [facility]. Elisabeth Pigeon, case discussed, including:  HPI, pertinent PM/SHx, VS/PE, dx testing, ED course and treatment:  Agreeable to admit, requests to write temporary orders, obtain tele bed to team 1.  T/C to [facility], case discussed, including:  HPI, pertinent PM/SHx, VS/PE, dx testing, ED course and treatment:  Agreeable to consult.         Laray Anger, DO 06/22/12 2028

## 2012-06-23 ENCOUNTER — Inpatient Hospital Stay (HOSPITAL_COMMUNITY)
Admission: RE | Admit: 2012-06-23 | Discharge: 2012-06-23 | Disposition: A | Discharge status: Home / Self Care | Payer: Medicare Other | Source: Ambulatory Visit | Attending: Cardiovascular Disease | Admitting: Cardiovascular Disease

## 2012-06-23 ENCOUNTER — Ambulatory Visit (HOSPITAL_COMMUNITY)
Admission: RE | Admit: 2012-06-23 | Discharge: 2012-06-23 | Disposition: A | Discharge status: Home / Self Care | Payer: Medicare Other | Source: Ambulatory Visit | Attending: Cardiovascular Disease | Admitting: Cardiovascular Disease

## 2012-06-23 DIAGNOSIS — R079 Chest pain, unspecified: Secondary | ICD-10-CM

## 2012-06-23 DIAGNOSIS — R9431 Abnormal electrocardiogram [ECG] [EKG]: Secondary | ICD-10-CM

## 2012-06-23 DIAGNOSIS — I369 Nonrheumatic tricuspid valve disorder, unspecified: Secondary | ICD-10-CM

## 2012-06-23 HISTORY — PX: TRANSTHORACIC ECHOCARDIOGRAM: SHX275

## 2012-06-23 LAB — LIPID PANEL
HDL: 82 mg/dL (ref >39)
LDL Cholesterol: 52 mg/dL (ref 0–99)
Total CHOL/HDL Ratio: 1.7 RATIO
Triglycerides: 42 mg/dL (ref <150)
VLDL: 8 mg/dL (ref 0–40)

## 2012-06-23 LAB — TROPONIN I: Troponin I: <0.3 ng/mL (ref <0.30)

## 2012-06-23 LAB — TSH: TSH: 1.497 u[IU]/mL (ref 0.350–4.500)

## 2012-06-23 LAB — BASIC METABOLIC PANEL
BUN: 8 mg/dL (ref 6–23)
GFR calc Af Amer: >90 mL/min (ref >90)
GFR calc non Af Amer: 87 mL/min — ABNORMAL LOW (ref >90)
Potassium: 3.6 mEq/L (ref 3.5–5.1)
Sodium: 129 mEq/L — ABNORMAL LOW (ref 135–145)

## 2012-06-23 LAB — CBC
MCHC: 36.1 g/dL — ABNORMAL HIGH (ref 30.0–36.0)
Platelets: 189 10*3/uL (ref 150–400)
RDW: 12.8 % (ref 11.5–15.5)
WBC: 5.1 10*3/uL (ref 4.0–10.5)

## 2012-06-23 MED ORDER — TECHNETIUM TC 99M SESTAMIBI GENERIC - CARDIOLITE
10.0000 | Freq: Once | INTRAVENOUS | Status: AC | PRN
  Administered 2012-06-23: 10 via INTRAVENOUS

## 2012-06-23 MED ORDER — REGADENOSON 0.4 MG/5ML IV SOLN
0.4000 mg | Freq: Once | INTRAVENOUS | Status: AC
  Administered 2012-06-23: 0.4 mg via INTRAVENOUS

## 2012-06-23 MED ORDER — HYDROCHLOROTHIAZIDE 12.5 MG PO CAPS: 12.5000 mg | ORAL_CAPSULE | Freq: Every day | ORAL | Status: DC

## 2012-06-23 MED ORDER — TECHNETIUM TC 99M SESTAMIBI GENERIC - CARDIOLITE
30.0000 | Freq: Once | INTRAVENOUS | Status: AC | PRN
  Administered 2012-06-23: 30 via INTRAVENOUS

## 2012-06-23 MED ORDER — BOOST PLUS PO LIQD
237.0000 mL | ORAL | Status: DC
  Filled 2012-06-23: qty 237

## 2012-06-23 MED ORDER — HYDROCHLOROTHIAZIDE 12.5 MG PO CAPS
12.5000 mg | ORAL_CAPSULE | Freq: Every day | ORAL | Status: DC
  Administered 2012-06-23: 12.5 mg via ORAL
  Filled 2012-06-23: qty 1

## 2012-06-23 NOTE — Discharge Summary (Signed)
Physician Discharge Summary  Patient ID: Regina Ortiz MRN: [id number] DOB/AGE: 67-19-1947 67 y.o.  Admit date: 06/22/2012 Discharge date: 06/23/2012  PCP: Carilyn Goodpasture, PA  DISCHARGE DIAGNOSES:  Principal Problem:  *Chest pain Active Problems:  Anemia   RECOMMENDATIONS TO PCP: 1. Started HCTZ instead of Diltiazem due to LBBB. Please check electrolytes and renal function next week.  DISCHARGE CONDITION: fair  INITIAL HISTORY: 66 year old female with history of rheumatoid arthritis, hypertension and active smoking who was sent to ED after EKG showed LBBB. Patient had pre op evaluation for CO2 laser excision of vulvar dysplasia and her pre op EKG showed LBBB which was new compared to previous EKG's. Since patient is a smoker, she was sent to ED for further evaluation. Patient reported she has had on and off chest pains for some time but never really felt it bothered her. Her pain would mostly be located in left sternal area, non reproducible, lasting for short time only. Patient does report associated shortness of breath on exertion but is able to ambulate. No abdominal pain, no nausea or vomiting. No lightheadedness or loss of consciousness. No fever or chills.  HOSPITAL COURSE:   Patient was admitted to the hospital due to new LBBB and occasional episodes of chest pain. Patient was seen by cardiology and underwent a nuclear stress test today. She also underwent an echocardiogram. The stress test was low-risk. EF was greater than 70%. Echocardiogram did not show any evidence for hypertrophy or cardiomyopathy. Patient remained asymptomatic. Cardiology recommended discontinuing diltiazem. Patient was initiated on hydrochlorothiazide. She was asked to discuss this further with her primary care provider. She will need to have electrolytes and renal function checked next week.   Rheumatoid and Hypertension have remained stable. She was considered stable for discharge.  IMAGING  STUDIES Ct Angio Chest Pe W/cm &/or Wo Cm  06/22/2012  *RADIOLOGY REPORT*  Clinical Data: Shortness of breath, rule out pulmonary embolus  CT ANGIOGRAPHY CHEST  Technique:  Multidetector CT imaging of the chest using the standard protocol during bolus administration of intravenous contrast. Multiplanar reconstructed images including MIPs were obtained and reviewed to evaluate the vascular anatomy.  Contrast: OMNIPAQUE IOHEXOL 350 MG/ML SOLN  Comparison: None.  Findings: Images of the thoracic inlet are unremarkable.  Central airways are patent.  Atherosclerotic calcifications of the thoracic aorta.  Atherosclerotic calcifications of the coronary arteries.  Heart size is within normal limits.  Small anterior pericardial effusion measures 4 mm maximum thickness.  The visualized upper abdomen is unremarkable.  The study is excellent technical quality.  No pulmonary embolus is noted. No hilar or mediastinal adenopathy.  No axillary adenopathy.  Images of the lung parenchyma shows no acute infiltrate or pulmonary edema.  Mild centrilobular emphysematous changes are noted bilateral upper lobe.  Bilateral apical scarring.  Bullous emphysematous changes are noted right middle lobe and right base medially.  No pleural effusion is noted.  No destructive bony lesions are noted.  Sagittal images of the spine shows degenerative changes thoracic spine.  IMPRESSION:  1.  No pulmonary embolus. 2.  No adenopathy. 3.  No acute infiltrate or pulmonary edema. 4.  Bilateral emphysematous changes.  Bilateral apical pleuroparenchymal scarring. 5.  Small anterior pericardial effusion.   Original Report Authenticated By: Natasha Mead, M.D.    Nm Myocar Multi W/spect W/wall Motion / Ef  06/23/2012  Lexiscan Myovue:  Indication: Preop Clearence  Chest pain dyspnea and LBBB  The patient received .4mg  of lexiscan as a bolus  Baseline ECG had long PR and LBBB with infusion there was dyspnea.  Hemodynamics stable No change in ECG  The  images were reconstructed in the verticle , horizontal and short axis views  Restinig and stress images were normal with no ischemia or infarction  Surface images were normal with EF 83%  Impression:  Normal Lexiscan myovue EF 83%  [name] MD [facility]   Original Report Authenticated By: Darcus Austin Chest Port 1 View  06/22/2012  *RADIOLOGY REPORT*  Clinical Data: Preop  PORTABLE CHEST - 1 VIEW  Comparison: 12/28/2008  Findings: Cardiomediastinal silhouette is stable.  Mild hyperinflation.  No acute infiltrate or pulmonary edema.  Mild degenerative changes mid thoracic spine.  IMPRESSION: Mild hyperinflation.  No active disease.   Original Report Authenticated By: Natasha Mead, M.D.     DISCHARGE EXAMINATION: Blood pressure 153/75, pulse 79, temperature 97.8 F (36.6 C), temperature source Oral, resp. rate 18, height 5' 3.5" (1.613 m), weight 45.3 kg (99 lb 13.9 oz), SpO2 100.00%. General appearance: alert, cooperative, appears stated age and no distress Head: Normocephalic, without obvious abnormality, atraumatic Resp: clear to auscultation bilaterally Cardio: regular rate and rhythm, S1, S2 normal, no murmur, click, rub or gallop GI: soft, non-tender; bowel sounds normal; no masses,  no organomegaly Extremities: extremities normal, atraumatic, no cyanosis or edema Neurologic: Grossly normal  DISPOSITION: Home  Discharge Orders    Future Appointments: Provider: Department: Dept Phone: Center:   11/22/2012 9:30 AM Wendall Stade, MD [facility] [phone] [facility]     Future Orders Please Complete By Expires   Diet - low sodium heart healthy      Increase activity slowly      Discharge instructions      Comments:   Be sure to follow up with PCP     Current Discharge Medication List    START taking these medications   Details  hydrochlorothiazide (MICROZIDE) 12.5 MG capsule Take 1 capsule (12.5 mg total) by mouth daily. Qty: 30 capsule, Refills: 1      CONTINUE  these medications which have NOT CHANGED   Details  alendronate (FOSAMAX) 70 MG tablet Take 70 mg by mouth every 7 (seven) days. Take with a full glass of water on an empty stomach.    aspirin 81 MG tablet Take 81 mg by mouth daily.    Calcium Carbonate-Vitamin D (CALTRATE 600+D PO) Take by mouth daily.    Cholecalciferol (VITAMIN D3) 1000 UNITS CAPS Take by mouth daily.    etanercept (ENBREL) 50 MG/ML injection Inject 50 mg into the skin once a week.    imiquimod (ALDARA) 5 % cream Apply topically 3 (three) times a week.    Lysine 500 MG TABS Take 1 tablet by mouth 2 (two) times daily.    PREDNISONE PO Take 2.5 mg by mouth every morning.     PRENATAL VITAMINS PO Take 1 tablet by mouth daily.    valsartan (DIOVAN) 160 MG tablet Take 160 mg by mouth every morning.       STOP taking these medications     diltiazem (TIAZAC) 360 MG 24 hr capsule        Follow-up Information    Follow up with [name], MD. On 11/22/2012. (9:30 AM)    Contact information:   [address] [place] [address] [phone]       Follow up with [facility], [facility]. Schedule an appointment as soon as possible for a visit in  1 week. (post hospitalization follow up and blood work to check potassium level)    Contact information:   [address] [place] [address] [phone]          TOTAL DISCHARGE TIME: 35 mins  [facility]  [facility] Pager [phone]  06/23/2012, 5:22 PM

## 2012-06-23 NOTE — Progress Notes (Signed)
INITIAL ADULT NUTRITION ASSESSMENT Date: 06/23/2012   Time: 3:03 PM Reason for Assessment: Low BMI  ASSESSMENT: Female 66 y.o.  Dx: Chest pain, hyponatremia  Hx: hyponatremia Past Medical History  Diagnosis Date  . Dysplasia     Multifocal genital, VIN and VAIN  . Arthritis   . Hypertension   . Osteoporosis   . History of anal fissures   . Mild depression   . RA (rheumatoid arthritis)   . Bruises easily   . Nocturia   . Smokers' cough    Past Surgical History  Procedure Date  . Wide local excision of labia majora 04-04-2010    Right-sided lesion, CO2 ablation of right labia minora  . Excision vulvar lesions   . Appendectomy 1992  . Vaginal hysterectomy 1997    Related Meds:  Past Medical History  Diagnosis Date  . Dysplasia     Multifocal genital, VIN and VAIN  . Arthritis   . Hypertension   . Osteoporosis   . History of anal fissures   . Mild depression   . RA (rheumatoid arthritis)   . Bruises easily   . Nocturia   . Smokers' cough    Ht: 5' 3.5" (161.3 cm)  Wt: 99 lb 13.9 oz (45.3 kg)  Ideal Wt: 53.55 kg % Ideal Wt: 85  Usual Wt: 107# in January Wt Readings from Last 10 Encounters:  06/22/12 99 lb 13.9 oz (45.3 kg)  06/22/12 99 lb 13.9 oz (45.3 kg)  05/27/12 99 lb 12.8 oz (45.269 kg)  03/30/12 100 lb 9.6 oz (45.632 kg)  01/29/12 102 lb 9.6 oz (46.539 kg)  % Usual Wt: 99, 93% ov weight 9 months ago  Body mass index is 17.41 kg/(m^2).-underweight  Labs:  CMP     Component Value Date/Time   NA 129* 06/23/2012 0500   K 3.6 06/23/2012 0500   CL 100 06/23/2012 0500   CO2 21 06/23/2012 0500   GLUCOSE 85 06/23/2012 0500   BUN 8 06/23/2012 0500   CREATININE 0.71 06/23/2012 0500   CALCIUM 8.1* 06/23/2012 0500   PROT 6.9 06/22/2012 1836   ALBUMIN 3.5 06/22/2012 1836   AST 22 06/22/2012 1836   ALT 14 06/22/2012 1836   ALKPHOS 36* 06/22/2012 1836   BILITOT 0.4 06/22/2012 1836   GFRNONAA 87* 06/23/2012 0500   GFRAA >90 06/23/2012 0500     I/O last 3 completed shifts: In: 1715 [P.O.:222; I.V.:1490; Other:3] Out: 750 [Urine:750] Total I/O In: -  Out: 150 [Urine:150]   Diet Order: Cardiac  Supplements/Tube Feeding:  none  IVF:     sodium chloride Last Rate: 75 mL/hr at 06/23/12 0318    Estimated Nutritional Needs:   Kcal: 1350-1450  Protein: 55-65 gm Fluid: >1.4l  Food/Nutrition Related Hx: Pt reports a weight loss of about 25# 4-5 years ago and has not been able to regain it.  7% weight loss since January.  Pt reports eating well until approximately 2 weeks ago.  Drinks Equate Nutritional supplement (1 can daily) for the past 2 weeks prior to admit. Does not tolerate Ensure.  Pt is lactose intolerant.  Pt reports a hx of hyponatremia.  (Started after restricting salt and increasing fluid intake in July.)  Has not eaten much since admit secondary npo for multiple tests.  Pt is underweight.   NUTRITION DIAGNOSIS: -Increased nutrient needs (NI-5.1).  Status: Ongoing  RELATED TO: inability to gain weight  AS EVIDENCE BY: underweight  MONITORING/EVALUATION(Goals): Intake, labs, weight Goal:  >75% meals and  supplements  EDUCATION NEEDS: -No education needs identified at this time  INTERVENTION: Boost once daily  Dietitian [phone]  DOCUMENTATION CODES Per approved criteria  -Underweight    Jobe, Anastasia Fiedler 06/23/2012, 3:03 PM

## 2012-06-23 NOTE — Progress Notes (Signed)
  Echocardiogram 2D Echocardiogram has been performed.  Regina Ortiz 06/23/2012, 1:16 PM

## 2012-06-23 NOTE — Progress Notes (Signed)
   CARE MANAGEMENT NOTE 06/23/2012  Patient:  Regina Ortiz, Regina Ortiz   Account Number:  [id number]  Date Initiated:  06/23/2012  Documentation initiated by:  Jiles Crocker  Subjective/Objective Assessment:   ADMITTED WITH CHEST PAIN WITH EKG CHANGES     Action/Plan:   PCP: Carilyn Goodpasture, PA  LIVES AT HOME WITH SPOUSE   Anticipated DC Date:  06/25/2012   Anticipated DC Plan:  HOME/SELF CARE      DC Planning Services  CM consult         Status of service:  In process, will continue to follow Medicare Important Message given?  NA - LOS <3 / Initial given by admissions (If response is "NO", the following Medicare IM given date fields will be blank)  Per UR Regulation:  Reviewed for med. necessity/level of care/duration of stay  Comments:  06/23/2012- B Art Levan RN,BSN,MHA

## 2012-06-23 NOTE — Progress Notes (Signed)
Patient ID: Regina Ortiz, female   DOB: October 06, 1945, 66 y.o.   MRN: [id number]    Subjective:  Denies SSCP, palpitations or Dyspnea IV in hand hurts  Objective:  Filed Vitals:   06/22/12 1628 06/22/12 1802 06/22/12 2110 06/23/12 0454  BP: 131/56 143/62 134/58 140/70  Pulse: 81 81 77 72  Temp: 98.4 F (36.9 C) 98.2 F (36.8 C) 98.5 F (36.9 C) 97.9 F (36.6 C)  TempSrc: Oral Oral Oral Oral  Resp: 21 20 19    Height:  5' 3.5" (1.613 m)    Weight:  99 lb 13.9 oz (45.3 kg)    SpO2: 100% 100% 99% 99%    Intake/Output from previous day:  Intake/Output Summary (Last 24 hours) at 06/23/12 0805 Last data filed at 06/23/12 0725  Gross per 24 hour  Intake   1715 ml  Output    900 ml  Net    815 ml    Physical Exam: Affect appropriate Healthy:  appears stated age HEENT: normal Neck supple with no adenopathy JVP normal no bruits no thyromegaly Lungs clear with no wheezing and good diaphragmatic motion Heart:  S1/S2 no murmur, no rub, gallop or click PMI normal Abdomen: benighn, BS positve, no tenderness, no AAA no bruit.  No HSM or HJR Distal pulses intact with no bruits No edema Neuro non-focal Skin warm and dry No muscular weakness   Lab Results: Basic Metabolic Panel:  Basename 06/23/12 0500 06/22/12 2059 06/22/12 1836 06/22/12 1030  NA 129* -- 128* --  K 3.6 -- 3.4* --  CL 100 -- 98 --  CO2 21 -- 24 --  GLUCOSE 85 -- 118* --  BUN 8 -- 7 --  CREATININE 0.71 -- 0.70 --  CALCIUM 8.1* -- 8.8 --  MG -- 1.9 -- 1.9  PHOS -- -- -- --   Liver Function Tests:  [facility] 06/22/12 1836  AST 22  ALT 14  ALKPHOS 36*  BILITOT 0.4  PROT 6.9  ALBUMIN 3.5   No results found for this basename: LIPASE:2,AMYLASE:2 in the last 72 hours CBC:  Basename 06/23/12 0500 06/22/12 1030  WBC 5.1 5.6  NEUTROABS -- 4.1  HGB 10.3* 11.7*  HCT 28.5* 31.8*  MCV 102.9* 102.3*  PLT 189 212   Cardiac Enzymes:  Basename 06/23/12 0500 06/22/12 2059 06/22/12 1030  CKTOTAL -- -- --   CKMB -- -- --  CKMBINDEX -- -- --  TROPONINI <0.30 <0.30 <0.30   BNP: No components found with this basename: POCBNP:3 D-Dimer:  Basename 06/22/12 1030  DDIMER 1.11*   Hemoglobin A1C: No results found for this basename: HGBA1C in the last 72 hours Fasting Lipid Panel:  Basename 06/23/12 0500  CHOL 142  HDL 82  LDLCALC 52  TRIG 42  CHOLHDL 1.7  LDLDIRECT --   Thyroid Function Tests:  Basename 06/22/12 1836  TSH 1.497  T4TOTAL --  T3FREE --  THYROIDAB --   Anemia Panel: No results found for this basename: VITAMINB12,FOLATE,FERRITIN,TIBC,IRON,RETICCTPCT in the last 72 hours  Imaging: Ct Angio Chest Pe W/cm &/or Wo Cm  06/22/2012  *RADIOLOGY REPORT*  Clinical Data: Shortness of breath, rule out pulmonary embolus  CT ANGIOGRAPHY CHEST  Technique:  Multidetector CT imaging of the chest using the standard protocol during bolus administration of intravenous contrast. Multiplanar reconstructed images including MIPs were obtained and reviewed to evaluate the vascular anatomy.  Contrast: OMNIPAQUE IOHEXOL 350 MG/ML SOLN  Comparison: None.  Findings: Images of the thoracic inlet are unremarkable.  Central  airways are patent.  Atherosclerotic calcifications of the thoracic aorta.  Atherosclerotic calcifications of the coronary arteries.  Heart size is within normal limits.  Small anterior pericardial effusion measures 4 mm maximum thickness.  The visualized upper abdomen is unremarkable.  The study is excellent technical quality.  No pulmonary embolus is noted. No hilar or mediastinal adenopathy.  No axillary adenopathy.  Images of the lung parenchyma shows no acute infiltrate or pulmonary edema.  Mild centrilobular emphysematous changes are noted bilateral upper lobe.  Bilateral apical scarring.  Bullous emphysematous changes are noted right middle lobe and right base medially.  No pleural effusion is noted.  No destructive bony lesions are noted.  Sagittal images of the spine shows  degenerative changes thoracic spine.  IMPRESSION:  1.  No pulmonary embolus. 2.  No adenopathy. 3.  No acute infiltrate or pulmonary edema. 4.  Bilateral emphysematous changes.  Bilateral apical pleuroparenchymal scarring. 5.  Small anterior pericardial effusion.   Original Report Authenticated By: Regina Ortiz, M.D.    Dg Chest Port 1 View  06/22/2012  *RADIOLOGY REPORT*  Clinical Data: Preop  PORTABLE CHEST - 1 VIEW  Comparison: 12/28/2008  Findings: Cardiomediastinal silhouette is stable.  Mild hyperinflation.  No acute infiltrate or pulmonary edema.  Mild degenerative changes mid thoracic spine.  IMPRESSION: Mild hyperinflation.  No active disease.   Original Report Authenticated By: Regina Ortiz, M.D.     Cardiac Studies:  ECG: NSR first degree block LBBB   Telemetry:  NSR no arrhythmia or AV block  Echo: pending  Medications:     . aspirin  324 mg Oral Once  . aspirin EC  81 mg Oral Daily  . cholecalciferol  1,000 Units Oral Daily  . diltiazem  360 mg Oral q morning - 10a  . enoxaparin (LOVENOX) injection  30 mg Subcutaneous Q24H  . etanercept  50 mg Subcutaneous Weekly  . imiquimod   Topical 3 times weekly  . irbesartan  150 mg Oral Daily  . predniSONE  2.5 mg Oral Q breakfast  . DISCONTD: alendronate  70 mg Oral Q7 days  . DISCONTD: aspirin  324 mg Oral NOW  . DISCONTD: aspirin  81 mg Oral Daily  . DISCONTD: aspirin  300 mg Rectal NOW  . DISCONTD: Lysine  1 tablet Oral BID       . sodium chloride 75 mL/hr at 06/23/12 0318  . DISCONTD: lactated ringers Stopped (06/22/12 1053)    Assessment/Plan:  Chest Pain:  Adenosine myovue today.  Atypical but smoker and new LBBB since 2011 HTN:  D/C cardizem with long PR and LBBB start ACE Smoking: counseled on cessation willing to try Chantix will F/U with primary Regina Ortiz Dyspnea:  Echo today R/O DCM.  No CE on CXR  Can be D/C latter today if tests normal  Regina Ortiz 06/23/2012, 8:05 AM

## 2012-06-24 ENCOUNTER — Encounter: Payer: Self-pay | Admitting: *Deleted

## 2012-06-24 ENCOUNTER — Telehealth: Payer: Self-pay | Admitting: Cardiovascular Disease

## 2012-06-24 NOTE — Telephone Encounter (Signed)
They need surgical clearance for procedure on 11/21

## 2012-06-24 NOTE — Progress Notes (Signed)
Pt has completed work-up for bundle branch block that resulted in cancellation of Laser procedure 07/23/12.  Surgery rescheduled for 07/22/12 and pt notified.

## 2012-06-24 NOTE — Telephone Encounter (Signed)
UNABLE TO LEAVE MESSAGE./CY 

## 2012-06-25 LAB — URINE CULTURE

## 2012-07-02 HISTORY — PX: OTHER SURGICAL HISTORY: SHX169

## 2012-07-07 NOTE — Telephone Encounter (Signed)
PT NEEDING TO  HAVE LASER VAPORIZATION OF  VULVA  ,PROCEDURE TO TAKE APPROX 30 MIN NEED CLEARANCE ECHO AND MYOVIEW FROM 06-23-12  LOOKS OKAY PLEASE ADVISE .Zack Seal

## 2012-07-07 NOTE — Telephone Encounter (Signed)
Ok for [employer]"'

## 2012-07-08 NOTE — Telephone Encounter (Signed)
Regina Ortiz PT MAY PROCEED WITH PROCEDURE./CY

## 2012-07-15 ENCOUNTER — Encounter (HOSPITAL_BASED_OUTPATIENT_CLINIC_OR_DEPARTMENT_OTHER): Payer: Self-pay | Admitting: *Deleted

## 2012-07-15 NOTE — Progress Notes (Signed)
NPO AFTER MN. ARRIVES AT 0600. NEEDS ISTAT. CURRENT EKG IN EPIC AND CHART. WILL TAKE PREDNISONE, DILTIAZEM, AND WELLBUTRIN AM OF SURG W/ SIP OF WATER. PRE-OP ORDERS PENDING, DR BREWSTER OFFICE NOTIFIED.

## 2012-07-21 NOTE — Anesthesia Preprocedure Evaluation (Addendum)
Anesthesia Evaluation  Patient identified by MRN, date of birth, ID band Patient awake    Reviewed: Allergy & Precautions, H&P , NPO status , Patient's Chart, lab work & pertinent test results  Airway Mallampati: II TM Distance: >3 FB Neck ROM: full    Dental  (+) Edentulous Upper and Dental Advisory Given   Pulmonary neg pulmonary ROS, shortness of breath and with exertion, COPDCurrent Smoker,  Moderate COPD breath sounds clear to auscultation  Pulmonary exam normal       Cardiovascular Exercise Tolerance: Good hypertension, Pt. on medications negative cardio ROS  Rhythm:regular Rate:Normal  LBBB ( new since 2011 ) with prolonged pr interval.  Has cardiac clearance with echo and myoview.   Neuro/Psych Anxiety Depression negative neurological ROS  negative psych ROS   GI/Hepatic negative GI ROS, Neg liver ROS,   Endo/Other  negative endocrine ROS  Renal/GU negative Renal ROS  negative genitourinary   Musculoskeletal  (+) Arthritis -, Rheumatoid disorders,    Abdominal   Peds  Hematology negative hematology ROS (+)   Anesthesia Other Findings   Reproductive/Obstetrics negative OB ROS                          Anesthesia Physical Anesthesia Plan  ASA: III  Anesthesia Plan: General   Post-op Pain Management:    Induction: Intravenous  Airway Management Planned: LMA  Additional Equipment:   Intra-op Plan:   Post-operative Plan:   Informed Consent: I have reviewed the patients History and Physical, chart, labs and discussed the procedure including the risks, benefits and alternatives for the proposed anesthesia with the patient or authorized representative who has indicated his/her understanding and acceptance.   Dental Advisory Given  Plan Discussed with: CRNA and Surgeon  Anesthesia Plan Comments:         Anesthesia Quick Evaluation

## 2012-07-22 ENCOUNTER — Ambulatory Visit (HOSPITAL_BASED_OUTPATIENT_CLINIC_OR_DEPARTMENT_OTHER)
Admission: RE | Admit: 2012-07-22 | Discharge: 2012-07-22 | Disposition: A | Discharge status: Home / Self Care | Payer: Medicare Other | Source: Ambulatory Visit | Attending: Gynecologic Oncology | Admitting: Gynecologic Oncology

## 2012-07-22 ENCOUNTER — Encounter (HOSPITAL_BASED_OUTPATIENT_CLINIC_OR_DEPARTMENT_OTHER): Payer: Self-pay | Admitting: Anesthesiology

## 2012-07-22 ENCOUNTER — Ambulatory Visit (HOSPITAL_BASED_OUTPATIENT_CLINIC_OR_DEPARTMENT_OTHER): Payer: Medicare Other | Admitting: Anesthesiology

## 2012-07-22 ENCOUNTER — Encounter (HOSPITAL_BASED_OUTPATIENT_CLINIC_OR_DEPARTMENT_OTHER)
Admission: RE | Disposition: A | Discharge status: Home / Self Care | Payer: Self-pay | Source: Ambulatory Visit | Attending: Gynecologic Oncology

## 2012-07-22 DIAGNOSIS — Z90710 Acquired absence of both cervix and uterus: Secondary | ICD-10-CM | POA: Insufficient documentation

## 2012-07-22 DIAGNOSIS — J4489 Other specified chronic obstructive pulmonary disease: Secondary | ICD-10-CM | POA: Insufficient documentation

## 2012-07-22 DIAGNOSIS — F17200 Nicotine dependence, unspecified, uncomplicated: Secondary | ICD-10-CM | POA: Insufficient documentation

## 2012-07-22 DIAGNOSIS — R079 Chest pain, unspecified: Secondary | ICD-10-CM

## 2012-07-22 DIAGNOSIS — J449 Chronic obstructive pulmonary disease, unspecified: Secondary | ICD-10-CM | POA: Insufficient documentation

## 2012-07-22 DIAGNOSIS — I10 Essential (primary) hypertension: Secondary | ICD-10-CM | POA: Insufficient documentation

## 2012-07-22 DIAGNOSIS — D071 Carcinoma in situ of vulva: Secondary | ICD-10-CM | POA: Insufficient documentation

## 2012-07-22 DIAGNOSIS — M810 Age-related osteoporosis without current pathological fracture: Secondary | ICD-10-CM | POA: Insufficient documentation

## 2012-07-22 HISTORY — DX: Shortness of breath: R06.02

## 2012-07-22 HISTORY — DX: Major depressive disorder, single episode, unspecified: F32.9

## 2012-07-22 HISTORY — PX: CO2 LASER APPLICATION: SHX5778

## 2012-07-22 HISTORY — DX: Chronic obstructive pulmonary disease, unspecified: J44.9

## 2012-07-22 HISTORY — DX: Left bundle-branch block, unspecified: I44.7

## 2012-07-22 HISTORY — DX: Depression, unspecified: F32.A

## 2012-07-22 HISTORY — DX: Anxiety disorder, unspecified: F41.9

## 2012-07-22 LAB — POCT I-STAT 4, (NA,K, GLUC, HGB,HCT)
Glucose, Bld: 102 mg/dL — ABNORMAL HIGH (ref 70–99)
Hemoglobin: 14.3 g/dL (ref 12.0–15.0)
Potassium: 4.2 mEq/L (ref 3.5–5.1)

## 2012-07-22 SURGERY — CO2 LASER APPLICATION
Anesthesia: General | Site: Vagina | Wound class: Clean Contaminated

## 2012-07-22 MED ORDER — LIDOCAINE HCL (CARDIAC) 20 MG/ML IV SOLN
INTRAVENOUS | Status: DC | PRN
  Administered 2012-07-22: 60 mg via INTRAVENOUS

## 2012-07-22 MED ORDER — ACETIC ACID 5 % SOLN
Status: DC | PRN
  Administered 2012-07-22: 1 via TOPICAL

## 2012-07-22 MED ORDER — SODIUM CHLORIDE 0.9 % IR SOLN
Status: DC | PRN
  Administered 2012-07-22: 1

## 2012-07-22 MED ORDER — LACTATED RINGERS IV SOLN
INTRAVENOUS | Status: DC
  Filled 2012-07-22: qty 1000

## 2012-07-22 MED ORDER — FENTANYL CITRATE 0.05 MG/ML IJ SOLN
INTRAMUSCULAR | Status: DC | PRN
  Administered 2012-07-22: 50 ug via INTRAVENOUS
  Administered 2012-07-22 (×3): 25 ug via INTRAVENOUS

## 2012-07-22 MED ORDER — FENTANYL CITRATE 0.05 MG/ML IJ SOLN
25.0000 ug | INTRAMUSCULAR | Status: DC | PRN
  Administered 2012-07-22: 25 ug via INTRAVENOUS
  Administered 2012-07-22: 12.5 ug via INTRAVENOUS
  Filled 2012-07-22: qty 1

## 2012-07-22 MED ORDER — DEXAMETHASONE SODIUM PHOSPHATE 4 MG/ML IJ SOLN
INTRAMUSCULAR | Status: DC | PRN
  Administered 2012-07-22: 4 mg via INTRAVENOUS

## 2012-07-22 MED ORDER — LACTATED RINGERS IV SOLN
INTRAVENOUS | Status: DC
  Administered 2012-07-22 (×2): via INTRAVENOUS
  Filled 2012-07-22: qty 1000

## 2012-07-22 MED ORDER — SILVER SULFADIAZINE 1 % EX CREA
TOPICAL_CREAM | CUTANEOUS | Status: DC | PRN
  Administered 2012-07-22: 1 via TOPICAL

## 2012-07-22 MED ORDER — PROPOFOL 10 MG/ML IV BOLUS
INTRAVENOUS | Status: DC | PRN
  Administered 2012-07-22: 180 mg via INTRAVENOUS

## 2012-07-22 MED ORDER — ONDANSETRON HCL 4 MG/2ML IJ SOLN
INTRAMUSCULAR | Status: DC | PRN
  Administered 2012-07-22: 4 mg via INTRAVENOUS

## 2012-07-22 SURGICAL SUPPLY — 42 items
APPLICATOR COTTON TIP 6IN STRL (MISCELLANEOUS) ×2 IMPLANT
BAG DECANTER FOR FLEXI CONT (MISCELLANEOUS) IMPLANT
BLADE SURG 11 STRL SS (BLADE) IMPLANT
BLADE SURG 15 STRL LF DISP TIS (BLADE) ×1 IMPLANT
BLADE SURG 15 STRL SS (BLADE) ×1
CANISTER SUCTION 1200CC (MISCELLANEOUS) IMPLANT
CANISTER SUCTION 2500CC (MISCELLANEOUS) IMPLANT
CATH FOLEY 2WAY  5CC 16FR SIL (CATHETERS) ×1
CATH FOLEY 2WAY 5CC 16FR SIL (CATHETERS) ×1 IMPLANT
CLOTH BEACON ORANGE TIMEOUT ST (SAFETY) ×2 IMPLANT
DRAPE LG THREE QUARTER DISP (DRAPES) ×2 IMPLANT
DRAPE UNDERBUTTOCKS STRL (DRAPE) ×2 IMPLANT
DRESSING TELFA 8X3 (GAUZE/BANDAGES/DRESSINGS) ×2 IMPLANT
ELECT BALL LEEP 3MM BLK (ELECTRODE) IMPLANT
ELECT BALL LEEP 5MM RED (ELECTRODE) IMPLANT
ELECT NEEDLE TIP 2.8 STRL (NEEDLE) IMPLANT
ELECT REM PT RETURN 9FT ADLT (ELECTROSURGICAL)
ELECTRODE REM PT RTRN 9FT ADLT (ELECTROSURGICAL) IMPLANT
GLOVE INDICATOR 7.0 STRL GRN (GLOVE) ×2 IMPLANT
GLOVE SKINSENSE NS SZ7.0 (GLOVE) ×1
GLOVE SKINSENSE NS SZ7.5 (GLOVE) ×1
GLOVE SKINSENSE STRL SZ7.0 (GLOVE) ×1 IMPLANT
GLOVE SKINSENSE STRL SZ7.5 (GLOVE) ×1 IMPLANT
GOWN STRL REIN XL XLG (GOWN DISPOSABLE) ×4 IMPLANT
LEGGING LITHOTOMY PAIR STRL (DRAPES) ×2 IMPLANT
NDL SAFETY ECLIPSE 18X1.5 (NEEDLE) IMPLANT
NEEDLE HYPO 18GX1.5 SHARP (NEEDLE)
NS IRRIG 500ML POUR BTL (IV SOLUTION) ×2 IMPLANT
PACK BASIN DAY SURGERY FS (CUSTOM PROCEDURE TRAY) ×2 IMPLANT
PAD OB MATERNITY 4.3X12.25 (PERSONAL CARE ITEMS) ×2 IMPLANT
PAD PREP 24X48 CUFFED NSTRL (MISCELLANEOUS) IMPLANT
PENCIL BUTTON HOLSTER BLD 10FT (ELECTRODE) IMPLANT
SCOPETTES 8  STERILE (MISCELLANEOUS) ×1
SCOPETTES 8 STERILE (MISCELLANEOUS) ×1 IMPLANT
SYR CONTROL 10ML LL (SYRINGE) IMPLANT
SYR TB 1ML LL NO SAFETY (SYRINGE) IMPLANT
TOWEL OR 17X24 6PK STRL BLUE (TOWEL DISPOSABLE) ×4 IMPLANT
TRAY DSU PREP LF (CUSTOM PROCEDURE TRAY) ×2 IMPLANT
TUBE CONNECTING 12X1/4 (SUCTIONS) ×2 IMPLANT
VACUUM HOSE 7/8X10 W/ WAND (MISCELLANEOUS) ×2 IMPLANT
WATER STERILE IRR 500ML POUR (IV SOLUTION) ×4 IMPLANT
YANKAUER SUCT BULB TIP NO VENT (SUCTIONS) ×2 IMPLANT

## 2012-07-22 NOTE — Anesthesia Postprocedure Evaluation (Signed)
  Anesthesia Post-op Note  Patient: Regina Ortiz  Procedure(s) Performed: Procedure(s) (LRB): CO2 LASER APPLICATION (N/A)  Patient Location: PACU  Anesthesia Type: General  Level of Consciousness: awake and alert   Airway and Oxygen Therapy: Patient Spontanous Breathing  Post-op Pain: mild  Post-op Assessment: Post-op Vital signs reviewed, Patient's Cardiovascular Status Stable, Respiratory Function Stable, Patent Airway and No signs of Nausea or vomiting  Last Vitals:  Filed Vitals:   07/22/12 0900  BP: 127/64  Pulse: 66  Temp:   Resp: 11    Post-op Vital Signs: stable   Complications: No apparent anesthesia complications

## 2012-07-22 NOTE — Interval H&P Note (Signed)
History and Physical Interval Note:  07/22/2012 7:37 AM  Regina Ortiz  has presented today for surgery, with the diagnosis of Vulvar Lesion  The various methods of treatment have been discussed with the patient and family. After consideration of risks, benefits and other options for treatment, the patient has consented to  Procedure(s) (LRB) with comments: CO2 LASER APPLICATION (N/A) - Laser Vaporization as a surgical intervention .  The patient's history has been reviewed, patient examined, no change in status, stable for surgery.  I have reviewed the patient's chart and labs.  Questions were answered to the patient's satisfaction.     [place], [facility]

## 2012-07-22 NOTE — H&P (Signed)
Pre op Note: Gyn-Onc  Regina Ortiz 66 y.o. female  CC:  Chief Complaint   Patient presents with   .  VIN III     Follow up    PI:  HISTORY OF PRESENT ILLNESS: This is a 66 year old with multifocal lower genital tract dysplasia, both VIN and VAIN. History is notable for  hysterectomy for carcinoma in situ in 1997. She subsequently underwent upper vaginal colpectomies in 1983 and 2000 for vaginal dysplasia. In  June 2011, a vulva biopsy demonstrated VIN III. In August 2011, she underwent wide local excision of a right-sided inferior labia majora  lesion and CO2 ablation of the right labia minora. She denies any vulvar pruritus or discharge.  Pap 2010, 2011, 2012 2013 wnl. Patient seen 01/2012. Bx c/w VIN III. Patient given trial of Aldara.   INTERVAL HISTORY: She states that it has been difficult to apply Aldara to the external genitalia at the site of the lesions. Patient requests CO2 laser.  REVIEW OF SYSTEMS: No vulva pruritus, adenopathy, cough, shortness of breath; vulva or vaginal bleeding. No chest pain, nausea, vomiting, diarrhea, constipation,. No urinary incontinence, vaginal bleeding or discharge. Review of other systems non contributory.   Past Surgical Hx:  Past Surgical History   Procedure  Date   .  Abdominal hysterectomy  1997   .  Wide local excision of labia majora  04/2010     Right-sided lesion, CO2 ablation of right labia minora   .  Appendectomy      Ruptured, around 15 years ago    Past Medical Hx:  Past Medical History   Diagnosis  Date   .  Dysplasia      Multifocal genital, VIN and VAIN   .  Arthritis    .  Hypertension    .  Osteoporosis    .  History of anal fissures    .  Mild depression     Family Hx:  Family History   Problem  Relation  Age of Onset   .  Diabetes  Mother    .  Hypertension  Father    .  Lymphoma  Son     Vitals:BP 143/67  Pulse 76  Temp 96.8 F (36 C) (Oral)  Resp 18  Ht 5' 3.5" (1.613 m)  Wt 44.594 kg (98 lb 5 oz)  BMI  17.14 kg/m2  SpO2 100%  Physical Exam:  WD thin female in NAD  BP 140/80  Pulse 80  Temp 98 F (36.7 C) (Oral)  Resp 16  Ht 5' 3.58" (1.615 m)  Wt 99 lb 12.8 oz (45.269 kg)  BMI 17.36 kg/m2  CHEST: CTA  HEART: RRR  ABD: Soft, NT no masses  LYMPHATICS: No cervical, supraclavicular or inguinal adenopathy.  PELVIC: External genitalia with multifocal lesions on the left labia minora and the right posterior fourchette.  EXT: No CCE   Assessment/Plan:  This is a 66 y.o. year old with recurrent multifocal vulvar dysplasia. Patient declines to continue the trial of Aldara. Will proceed with CO2 laser ablation of the external genitalia on 07/22/2012 The postoperative routine was d/w the patient.

## 2012-07-22 NOTE — Op Note (Signed)
Preoperative Diagnosis: Vulvar intraepithelial neoplasia  Postoperative Diagnosis: Vulvar intraepithelial neoplasia  Procedure(s) Performed: Extensive CO2 laser ablation  Surgeon: [name].  Nelly Rout, M.D. PhD  Anesthesia: Gen. endotracheal with LMA  Specimens: None  Estimated Blood Loss: None mL. Blood Replacement: None  Urine Output: 5 cc   Indication for Procedure: This is a patient with long-standing genital multifocal dysplasia. At the time of evaluation in June she was noted to have recurrent vulvar intraepithelial neoplasia and Aldara cream as prescribed. Patient was unable to tolerate application of the external medication and as such presents for CO2 laser ablation.  Operative Findings: 5% acetic acid was applied generously to the external genitalia. The area of the lateral left labia majora was noted to be replaced with acetowhite changes. Acetowhite changes continued down inferior to the labia minora involving the perineum and there were some small areas of the inferior aspect of the right labia majora that were noted to have acetowhite changes.  Procedure: Patient was taken to the operating room and LMA anesthesia protect any difficulty. She was placed in the dorsolithotomy position and prepped and draped in usual sterile fashion. Wet towels were placed as per protocol for CO2 laser administration. 5% acetic acid was applied and the findings are as noted above. Used a pin was used to outline the area with approximately 4 mm margin at lateral aspect of the acetowhite changes. With use of the CO2 laser at 8 W with continuous pulse the outlined. There were destroyed to the third surgical plane. Normal saline was then applied to the treated area to assess the correct depth. This was assured. 5% acetic acid was then again placed in the external genitalia and no additional lesions were identified. Hemostasis was assured.  Silver Silvadene was placed over the treated areas.  Sponge, lap  and needle counts were correct x 3.    Complications: None           Disposition: PACU - hemodynamically stable.         Condition: stable

## 2012-07-22 NOTE — Anesthesia Procedure Notes (Signed)
Procedure Name: LMA Insertion Date/Time: 07/22/2012 7:45 AM Performed by: Fran Lowes Pre-anesthesia Checklist: Patient identified, Emergency Drugs available, Suction available and Patient being monitored Patient Re-evaluated:Patient Re-evaluated prior to inductionOxygen Delivery Method: Circle System Utilized Preoxygenation: Pre-oxygenation with 100% oxygen Intubation Type: IV induction Ventilation: Mask ventilation without difficulty LMA: LMA inserted LMA Size: 3.0 Number of attempts: 1 Airway Equipment and Method: bite block Placement Confirmation: positive ETCO2 Tube secured with: Tape Dental Injury: Teeth and Oropharynx as per pre-operative assessment

## 2012-07-22 NOTE — Transfer of Care (Signed)
Immediate Anesthesia Transfer of Care Note  Patient: Regina Ortiz  Procedure(s) Performed: Procedure(s) (LRB): CO2 LASER APPLICATION (N/A)  Patient Location: Patient transported to PACU with oxygen via face mask at 4 Liters / Min  Anesthesia Type: General  Level of Consciousness: awake and alert   Airway & Oxygen Therapy: Patient Spontanous Breathing and Patient connected to face mask oxygen  Post-op Assessment: Report given to PACU RN and Post -op Vital signs reviewed and stable  Post vital signs: Reviewed and stable  Dentition: Teeth and oropharynx remain in pre-op condition  Complications: No apparent anesthesia complications

## 2012-07-26 ENCOUNTER — Encounter (HOSPITAL_BASED_OUTPATIENT_CLINIC_OR_DEPARTMENT_OTHER): Payer: Self-pay | Admitting: Gynecologic Oncology

## 2012-09-07 ENCOUNTER — Encounter: Payer: Self-pay | Admitting: Gynecologic Oncology

## 2012-09-07 ENCOUNTER — Ambulatory Visit: Payer: Medicare Other | Attending: Gynecologic Oncology | Admitting: Gynecologic Oncology

## 2012-09-07 VITALS — BP 108/60 | HR 74 | Temp 97.6°F | Resp 16 | Ht 63.58 in | Wt 93.1 lb

## 2012-09-07 DIAGNOSIS — D071 Carcinoma in situ of vulva: Secondary | ICD-10-CM

## 2012-09-07 DIAGNOSIS — Z90710 Acquired absence of both cervix and uterus: Secondary | ICD-10-CM | POA: Insufficient documentation

## 2012-09-07 DIAGNOSIS — I10 Essential (primary) hypertension: Secondary | ICD-10-CM | POA: Insufficient documentation

## 2012-09-07 NOTE — Patient Instructions (Signed)
F/U in 6 months I advise that you stop  Smoking.   Thank you very much Regina Ortiz for allowing me to provide care for you today.  I appreciate your confidence in choosing our Gynecologic Oncology team.  If you have any questions about your visit today please call our office and we will get back to you as soon as possible.  Regina Ortiz. Oluwatimileyin Vivier MD., PhD Gynecologic Oncology

## 2012-09-07 NOTE — Progress Notes (Signed)
Follow-up Note: Gyn-Onc   Regina Ortiz 67 y.o. female  CC:  Chief Complaint  Patient presents with  . VIN    Follow up    HPI:  HISTORY OF PRESENT ILLNESS: This is a 67 year old with multifocal lower genital tract dysplasia, both VIN and VAIN. History is notable for  hysterectomy for carcinoma in situ in 1997. She subsequently underwent  upper vaginal colpectomies in 1983 and 2000 for vaginal dysplasia. In  June 2011, a vulva biopsy demonstrated VIN III. In August 2011, she  underwent wide local excision of a right-sided inferior labia majora  lesion and CO2 ablation of the right labia minora. She denies any  vulvar pruritus or discharge.   Pap 2010, 2011, 2012  2013 wnl.  Patient seen 01/2012.  Bx c/w VIN III.  Patient given trial of Aldara.  She stated that it has been difficult to apply Aldara to the external genitalia at the site of the lesions and requested CO2 laser treatment.  INTERVAL HISTORY:  She underwent Co2 laser ablation of the external genitalia on 06/22/2012.   REVIEW OF SYSTEMS: No vulva pruritus, adenopathy, cough, shortness of  breath; vulva or vaginal bleeding. No chest pain, nausea, vomiting, diarrhea, constipation,.  No urinary incontinence, vaginal bleeding or discharge.  Review of other systems non contributory.   Past Surgical Hx:  Past Surgical History  Procedure Date  . Wide local excision of labia majora 04-04-2010    Right-sided lesion, CO2 ablation of right labia minora  . Excision vulvar lesions   . Appendectomy 1992  . Vaginal hysterectomy 1997  . Repair fistula in ano/  i & d perirectal abscess 10-26-2002  DR Luretha Murphy  . Cardiovascular stress test 06-23-2012  DR [name]    NORMAL LEXISCAN MYOVIEW/ EF 83%  . Transthoracic echocardiogram 06-23-2012    NORMAL LVSF/ EF 55-60%  . Co2 laser application 07/22/2012    Procedure: CO2 LASER APPLICATION;  Surgeon: Laurette Schimke, MD PHD;  Location: [facility]  Service:  Gynecology;  Laterality: N/A;  Laser Vaporization    Past Medical Hx:  Past Medical History  Diagnosis Date  . Dysplasia     Multifocal genital, VIN and VAIN  . Arthritis   . Hypertension   . Osteoporosis   . History of anal fissures   . RA (rheumatoid arthritis)   . Bruises easily   . Nocturia   . Smokers' cough   . Depression   . Anxiety   . COPD (chronic obstructive pulmonary disease)   . Short of breath on exertion   . LBBB (left bundle branch block) W/ PROLONGED PR    CARDIOLOGSIT-  DR Eden Emms-   LOV IN EPIC    Family Hx:  Family History  Problem Relation Age of Onset  . Diabetes Mother   . Hypertension Father   . Lymphoma Son      Vitals:  Blood pressure 108/60, pulse 74, temperature 97.6 F (36.4 C), temperature source Oral, resp. rate 16, height 5' 3.58" (1.615 m), weight 93 lb 1.6 oz (42.23 kg).  Physical Exam: WD thin female in NAD BP 108/60  Pulse 74  Temp 97.6 F (36.4 C) (Oral)  Resp 16  Ht 5' 3.58" (1.615 m)  Wt 93 lb 1.6 oz (42.23 kg)  BMI 16.19 kg/m2 PELVIC: External genitalia with extensive scaring.  Has healed well. EXT:  No CCE  Assessment/Plan:  This is a 67 y.o. year old with recurrent multifocal vulvar dysplasia. S/P  CO2  laser ablation of the external genitalia on 06/22/2012.  F/U in 6 months Advised to stop smoking.    Laurette Schimke, MD., PhD. 09/07/2012, 3:18 PM   .

## 2012-09-22 ENCOUNTER — Encounter (HOSPITAL_COMMUNITY): Payer: Self-pay | Admitting: Emergency Medicine

## 2012-09-22 ENCOUNTER — Inpatient Hospital Stay (HOSPITAL_COMMUNITY)
Admission: EM | Admit: 2012-09-22 | Discharge: 2012-09-27 | DRG: 392 | Disposition: A | Discharge status: Home / Self Care | Payer: Medicare Other | Attending: Internal Medicine | Admitting: Internal Medicine

## 2012-09-22 DIAGNOSIS — D649 Anemia, unspecified: Secondary | ICD-10-CM

## 2012-09-22 DIAGNOSIS — N390 Urinary tract infection, site not specified: Secondary | ICD-10-CM

## 2012-09-22 DIAGNOSIS — E876 Hypokalemia: Secondary | ICD-10-CM | POA: Diagnosis present

## 2012-09-22 DIAGNOSIS — R634 Abnormal weight loss: Secondary | ICD-10-CM | POA: Diagnosis present

## 2012-09-22 DIAGNOSIS — E871 Hypo-osmolality and hyponatremia: Secondary | ICD-10-CM

## 2012-09-22 DIAGNOSIS — E2749 Other adrenocortical insufficiency: Secondary | ICD-10-CM | POA: Diagnosis present

## 2012-09-22 DIAGNOSIS — F1010 Alcohol abuse, uncomplicated: Secondary | ICD-10-CM | POA: Diagnosis present

## 2012-09-22 DIAGNOSIS — Z681 Body mass index (BMI) 19 or less, adult: Secondary | ICD-10-CM

## 2012-09-22 DIAGNOSIS — N179 Acute kidney failure, unspecified: Secondary | ICD-10-CM

## 2012-09-22 DIAGNOSIS — D071 Carcinoma in situ of vulva: Secondary | ICD-10-CM

## 2012-09-22 DIAGNOSIS — R197 Diarrhea, unspecified: Principal | ICD-10-CM | POA: Diagnosis present

## 2012-09-22 DIAGNOSIS — R079 Chest pain, unspecified: Secondary | ICD-10-CM

## 2012-09-22 LAB — COMPREHENSIVE METABOLIC PANEL
Albumin: 3.8 g/dL (ref 3.5–5.2)
Alkaline Phosphatase: 48 U/L (ref 39–117)
BUN: 12 mg/dL (ref 6–23)
CO2: 20 mEq/L (ref 19–32)
Chloride: 93 mEq/L — ABNORMAL LOW (ref 96–112)
Glucose, Bld: 91 mg/dL (ref 70–99)
Potassium: 2.3 mEq/L — CL (ref 3.5–5.1)
Total Bilirubin: 0.4 mg/dL (ref 0.3–1.2)

## 2012-09-22 LAB — BASIC METABOLIC PANEL
BUN: 11 mg/dL (ref 6–23)
Chloride: 96 mEq/L (ref 96–112)
GFR calc Af Amer: 54 mL/min — ABNORMAL LOW (ref >90)
Potassium: 3 mEq/L — ABNORMAL LOW (ref 3.5–5.1)
Sodium: 128 mEq/L — ABNORMAL LOW (ref 135–145)

## 2012-09-22 LAB — CBC WITH DIFFERENTIAL/PLATELET
Hemoglobin: 12.2 g/dL (ref 12.0–15.0)
Lymphocytes Relative: 29 % (ref 12–46)
Lymphs Abs: 1.3 10*3/uL (ref 0.7–4.0)
Monocytes Relative: 13 % — ABNORMAL HIGH (ref 3–12)
Neutro Abs: 2.4 10*3/uL (ref 1.7–7.7)
Neutrophils Relative %: 57 % (ref 43–77)
RBC: 3.4 MIL/uL — ABNORMAL LOW (ref 3.87–5.11)

## 2012-09-22 LAB — MAGNESIUM: Magnesium: 1.7 mg/dL (ref 1.5–2.5)

## 2012-09-22 LAB — URINE MICROSCOPIC-ADD ON

## 2012-09-22 LAB — URINALYSIS, ROUTINE W REFLEX MICROSCOPIC
Bilirubin Urine: NEGATIVE
Ketones, ur: NEGATIVE mg/dL
Nitrite: POSITIVE — AB
pH: 6.5 (ref 5.0–8.0)

## 2012-09-22 LAB — LIPASE, BLOOD: Lipase: 20 U/L (ref 11–59)

## 2012-09-22 MED ORDER — HYDROCODONE-ACETAMINOPHEN 5-325 MG PO TABS
1.0000 | ORAL_TABLET | ORAL | Status: DC | PRN
  Administered 2012-09-22 – 2012-09-23 (×2): 1 via ORAL
  Administered 2012-09-24 – 2012-09-26 (×5): 2 via ORAL
  Filled 2012-09-22 (×4): qty 2
  Filled 2012-09-22 (×2): qty 1

## 2012-09-22 MED ORDER — HYOSCYAMINE SULFATE 0.125 MG PO TABS
0.1250 mg | ORAL_TABLET | ORAL | Status: DC | PRN
  Filled 2012-09-22: qty 1

## 2012-09-22 MED ORDER — ONDANSETRON HCL 4 MG PO TABS: 4.0000 mg | ORAL_TABLET | Freq: Four times a day (QID) | ORAL | Status: DC | PRN

## 2012-09-22 MED ORDER — POTASSIUM CHLORIDE CRYS ER 20 MEQ PO TBCR
40.0000 meq | EXTENDED_RELEASE_TABLET | Freq: Every day | ORAL | Status: DC
  Administered 2012-09-22 – 2012-09-27 (×6): 40 meq via ORAL
  Filled 2012-09-22 (×6): qty 2

## 2012-09-22 MED ORDER — ALENDRONATE SODIUM 70 MG PO TABS: 70.0000 mg | ORAL_TABLET | ORAL | Status: DC

## 2012-09-22 MED ORDER — BUPROPION HCL ER (SR) 150 MG PO TB12
150.0000 mg | ORAL_TABLET | Freq: Every day | ORAL | Status: DC
  Administered 2012-09-22 – 2012-09-27 (×6): 150 mg via ORAL
  Filled 2012-09-22 (×6): qty 1

## 2012-09-22 MED ORDER — ETANERCEPT 50 MG/ML ~~LOC~~ SOLN: 50.0000 mg | SUBCUTANEOUS | Status: DC

## 2012-09-22 MED ORDER — ENOXAPARIN SODIUM 30 MG/0.3ML ~~LOC~~ SOLN
30.0000 mg | SUBCUTANEOUS | Status: DC
  Administered 2012-09-22 – 2012-09-26 (×5): 30 mg via SUBCUTANEOUS
  Filled 2012-09-22 (×6): qty 0.3

## 2012-09-22 MED ORDER — DEXTROSE 5 % IV SOLN
1.0000 g | Freq: Once | INTRAVENOUS | Status: AC
  Administered 2012-09-22: 1 g via INTRAVENOUS
  Filled 2012-09-22: qty 10

## 2012-09-22 MED ORDER — PREDNISONE 2.5 MG PO TABS
2.5000 mg | ORAL_TABLET | Freq: Every day | ORAL | Status: DC
  Administered 2012-09-23: 2.5 mg via ORAL
  Filled 2012-09-22 (×3): qty 1

## 2012-09-22 MED ORDER — SODIUM CHLORIDE 0.9 % IV SOLN
INTRAVENOUS | Status: DC
  Administered 2012-09-22 – 2012-09-23 (×2): via INTRAVENOUS
  Administered 2012-09-23: 125 mL/h via INTRAVENOUS
  Administered 2012-09-23 – 2012-09-24 (×2): via INTRAVENOUS

## 2012-09-22 MED ORDER — DEXTROSE 5 % IV SOLN
1.0000 g | INTRAVENOUS | Status: DC
  Administered 2012-09-22 – 2012-09-23 (×2): 1 g via INTRAVENOUS
  Filled 2012-09-22 (×2): qty 10

## 2012-09-22 MED ORDER — ONDANSETRON HCL 4 MG/2ML IJ SOLN: 4.0000 mg | Freq: Four times a day (QID) | INTRAMUSCULAR | Status: DC | PRN

## 2012-09-22 MED ORDER — ASPIRIN 81 MG PO CHEW
81.0000 mg | CHEWABLE_TABLET | Freq: Every morning | ORAL | Status: DC
  Administered 2012-09-23 – 2012-09-27 (×5): 81 mg via ORAL
  Filled 2012-09-22 (×5): qty 1

## 2012-09-22 MED ORDER — DILTIAZEM HCL ER BEADS 240 MG PO CP24
360.0000 mg | ORAL_CAPSULE | Freq: Every morning | ORAL | Status: DC
  Administered 2012-09-23 – 2012-09-27 (×5): 360 mg via ORAL
  Filled 2012-09-22 (×5): qty 1

## 2012-09-22 MED ORDER — SODIUM CHLORIDE 0.9 % IJ SOLN
3.0000 mL | Freq: Two times a day (BID) | INTRAMUSCULAR | Status: DC
  Administered 2012-09-24 – 2012-09-27 (×7): 3 mL via INTRAVENOUS

## 2012-09-22 MED ORDER — POTASSIUM CHLORIDE CRYS ER 20 MEQ PO TBCR
60.0000 meq | EXTENDED_RELEASE_TABLET | Freq: Once | ORAL | Status: AC
  Administered 2012-09-22: 60 meq via ORAL
  Filled 2012-09-22: qty 3

## 2012-09-22 MED ORDER — POTASSIUM CHLORIDE 10 MEQ/100ML IV SOLN
10.0000 meq | Freq: Once | INTRAVENOUS | Status: AC
  Administered 2012-09-22: 10 meq via INTRAVENOUS
  Filled 2012-09-22: qty 100

## 2012-09-22 NOTE — ED Provider Notes (Signed)
History     CSN: [id number]  Arrival date & time 09/22/12  7829   First MD Initiated Contact with Patient 09/22/12 [phone]      Chief Complaint  Patient presents with  . Abdominal Pain    (Consider location/radiation/quality/duration/timing/severity/associated sxs/prior treatment) HPI Comments: Patient reports that she has had abdominal pain, cramping, weight loss and diarrhea for 2 months. She has seen her doctor for this a couple of times. She saw her doctor yesterday and had blood work done. The office called her today and said there were abnormalities in the blood work (she is not sure what) and she needed to come to the ER. Patient reports that the pain is intermittent moderate and crampy in the upper and central portion of the abdomen when it occurs. She is still having watery diarrhea. No rectal bleeding.  Patient is a 67 y.o. female presenting with abdominal pain.  Abdominal Pain The primary symptoms of the illness include abdominal pain and diarrhea. The primary symptoms of the illness do not include fever.    Past Medical History  Diagnosis Date  . Dysplasia     Multifocal genital, VIN and VAIN  . Arthritis   . Hypertension   . Osteoporosis   . History of anal fissures   . RA (rheumatoid arthritis)   . Bruises easily   . Nocturia   . Smokers' cough   . Depression   . Anxiety   . COPD (chronic obstructive pulmonary disease)   . Short of breath on exertion   . LBBB (left bundle branch block) W/ PROLONGED PR    CARDIOLOGSIT-  DR Eden Emms-   LOV IN EPIC    Past Surgical History  Procedure Date  . Wide local excision of labia majora 04-04-2010    Right-sided lesion, CO2 ablation of right labia minora  . Excision vulvar lesions   . Appendectomy 1992  . Vaginal hysterectomy 1997  . Repair fistula in ano/  i & d perirectal abscess 10-26-2002  DR Luretha Murphy  . Cardiovascular stress test 06-23-2012  DR [name]    NORMAL LEXISCAN MYOVIEW/ EF 83%  . Transthoracic  echocardiogram 06-23-2012    NORMAL LVSF/ EF 55-60%  . Co2 laser application 07/22/2012    Procedure: CO2 LASER APPLICATION;  Surgeon: Laurette Schimke, MD PHD;  Location: [facility];  Service: Gynecology;  Laterality: N/A;  Laser Vaporization    Family History  Problem Relation Age of Onset  . Diabetes Mother   . Hypertension Father   . Lymphoma Son     History  Substance Use Topics  . Smoking status: Current Every Day Smoker -- 0.5 packs/day for 50 years    Types: Cigarettes  . Smokeless tobacco: Never Used  . Alcohol Use: 7.2 oz/week    12 Cans of beer per week     Comment: 12-15 beers a week    OB History    Grav Para Term Preterm Abortions TAB SAB Ect Mult Living                  Review of Systems  Constitutional: Positive for unexpected weight change. Negative for fever.  Gastrointestinal: Positive for abdominal pain and diarrhea.  Neurological: Positive for weakness.  All other systems reviewed and are negative.    Allergies  Latex; Lactose intolerance (gi); Prinivil; Triamcinolone acetonide; Amlodipine; and Lotensin  Home Medications   Current Outpatient Rx  Name  Route  Sig  Dispense  Refill  . ALENDRONATE SODIUM  70 MG PO TABS   Oral   Take 70 mg by mouth every 7 (seven) days. Take with a full glass of water on an empty stomach.         . ASPIRIN 81 MG PO TABS   Oral   Take 81 mg by mouth daily.         . BUPROPION HCL ER (SR) 150 MG PO TB12   Oral   Take 150 mg by mouth daily.         Marland Kitchen CALTRATE 600+D PO   Oral   Take 1 tablet by mouth every morning.          Marland Kitchen VITAMIN D3 1000 UNITS PO CAPS   Oral   Take 1 capsule by mouth daily.          Marland Kitchen DILTIAZEM HCL ER BEADS 360 MG PO CP24   Oral   Take 360 mg by mouth every morning.         Marland Kitchen ETANERCEPT 50 MG/ML [place] SOLN   Subcutaneous   Inject 50 mg into the skin once a week. TUESDAYS         . LYSINE 500 MG PO TABS   Oral   Take 1 tablet by mouth 2 (two) times  daily.         Marland Kitchen PREDNISONE PO   Oral   Take 2.5 mg by mouth every morning.          Marland Kitchen PRENATAL VITAMINS PO   Oral   Take 1 tablet by mouth daily.         Marland Kitchen VALSARTAN 160 MG PO TABS   Oral   Take 160 mg by mouth every morning.            BP 132/70  Pulse 85  Temp 97.9 F (36.6 C)  Resp 18  SpO2 100%  Physical Exam  Constitutional: She is oriented to person, place, and time. She appears well-developed and well-nourished. No distress.  HENT:  Head: Normocephalic and atraumatic.  Right Ear: Hearing normal.  Nose: Nose normal.  Mouth/Throat: Oropharynx is clear and moist and mucous membranes are normal.  Eyes: Conjunctivae normal and EOM are normal. Pupils are equal, round, and reactive to light.  Neck: Normal range of motion. Neck supple.  Cardiovascular: Normal rate, regular rhythm, S1 normal and S2 normal.  Exam reveals no gallop and no friction rub.   No murmur heard. Pulmonary/Chest: Effort normal and breath sounds normal. No respiratory distress. She exhibits no tenderness.  Abdominal: Soft. Normal appearance and bowel sounds are normal. There is no hepatosplenomegaly. There is no tenderness. There is no rebound, no guarding, no tenderness at McBurney's point and negative Murphy's sign. No hernia.  Musculoskeletal: Normal range of motion.  Neurological: She is alert and oriented to person, place, and time. She has normal strength. No cranial nerve deficit or sensory deficit. Coordination normal. GCS eye subscore is 4. GCS verbal subscore is 5. GCS motor subscore is 6.  Skin: Skin is warm, dry and intact. No rash noted. No cyanosis.  Psychiatric: She has a normal mood and affect. Her speech is normal and behavior is normal. Thought content normal.    ED Course  Procedures (including critical care time)  Labs Reviewed  CBC WITH DIFFERENTIAL - Abnormal; Notable for the following:    RBC 3.40 (*)     HCT 33.5 (*)     MCH 35.9 (*)     MCHC 36.4 (*)  Monocytes Relative 13 (*)     All other components within normal limits  COMPREHENSIVE METABOLIC PANEL - Abnormal; Notable for the following:    Sodium 128 (*)     Potassium 2.3 (*)     Chloride 93 (*)     Creatinine, Ser 1.47 (*)     GFR calc non Af Amer 36 (*)     GFR calc Af Amer 41 (*)     All other components within normal limits  URINALYSIS, ROUTINE W REFLEX MICROSCOPIC - Abnormal; Notable for the following:    APPearance CLOUDY (*)     Hgb urine dipstick TRACE (*)     Nitrite POSITIVE (*)     Leukocytes, UA MODERATE (*)     All other components within normal limits  URINE MICROSCOPIC-ADD ON - Abnormal; Notable for the following:    Bacteria, UA MANY (*)     All other components within normal limits  LIPASE, BLOOD  CLOSTRIDIUM DIFFICILE BY PCR  STOOL CULTURE  URINE CULTURE   No results found.   Diagnosis: 1. Hypokalemia 2. Hyponatremia 2. Diarrhea. Urinary tract infection    MDM  Patient has had diarrhea for at least 2 months. Her doctor thinks is secondary to irritable bowel syndrome. The primary physician ordered labs yesterday and today called patient and told her that her labs were abnormal and she needed to go to the emergency room. Today's labs show mild hyponatremia and severe hypokalemia. This is secondary to the prolonged course of diarrhea. Thus far patient has been unable to give [place] a stool specimen, C. difficile and culture were ordered.  Because of the severity of her hypokalemia, she will require hospitalization. Patient given by mouth and IV supplementation here in the ER. The rest of the workup was fairly unremarkable other than evidence of urinary tract infection. Urine culture obtained and Rocephin administered.       Gilda Crease, MD 09/22/12 1250

## 2012-09-22 NOTE — ED Notes (Signed)
Urine sent down to lab

## 2012-09-22 NOTE — H&P (Addendum)
[facility] History and Physical  Regina Ortiz:[id number] DOB: 04-26-1946 DOA: 09/22/2012  Referring physician: ED physician PCP: Carilyn Goodpasture, PA   Chief Complaint: Diarrhea  HPI:  Pt is 67 yo female who presents to [facility] ED with main concern of progressive, watery diarrhea that initially started 2 months prior to admission and associated with intermittent episodes of generalized abdominal pain, with cramps and 3/10 in severity when present, no specific alleviating or aggravating factors, non radiating, no changes in appetite.Pt reports seeing PCP for the problem and has had blood tests done. She was called and advised to go to ED as her potassium level was low but she in not sure what the numbers are. She denies chest pain or shortness of breath, no urinary concerns, no weight loss.  In ED, pt found to have potassium 2.6, hyponatremia and Acute renal failure. [facility] asked to admit for further evaluation. Admission to telemetry required due to low potassium level.  Assessment and Plan:  Principal Problem:  *Diarrhea - unclear etiology at this time - check C. Diff by PCR, stool for ova and parasites - supportive care with IVF, antiemetics and analgesia as needed - hold of on CT abd as pt has acute renal failure and imaging with contrast are better for assessment - CT abdomen may be warranted once renal function stable and if no improvement noted Active Problems:  Hyponatremia - secondary to dehydration - will provide IVF - BMP in AM  Hypokalemia - secondary to diarrhea - will supplement and will also check MG level - repeat BMP today and once in AM  ARF (acute renal failure) - likely of pre renal etiology as well - IVF as noted above - encourage PO intake - BMP in AM  UTI - pt has no specific urinary symptoms, but UA is suggestive of either chronic colonization or an acute infection  - will treat empirically with Rocephin and will obtain urine culture for further  evaluation   Code Status: Full Family Communication: Pt at bedside Disposition Plan: Admit to telemetry unit  Review of Systems:  Constitutional: Negative for fever, chills and malaise/fatigue. Negative for diaphoresis.  HENT: Negative for hearing loss, ear pain, nosebleeds, congestion, sore throat, neck pain, tinnitus and ear discharge.   Eyes: Negative for blurred vision, double vision, photophobia, pain, discharge and redness.  Respiratory: Negative for cough, hemoptysis, sputum production, shortness of breath, wheezing and stridor.   Cardiovascular: Negative for chest pain, palpitations, orthopnea, claudication and leg swelling.  Gastrointestinal: Per HPI Genitourinary: Negative for dysuria, urgency, frequency, hematuria and flank pain.  Musculoskeletal: Negative for myalgias, back pain, joint pain and falls.  Skin: Negative for itching and rash.  Neurological: Negative for dizziness and weakness. Negative for tingling, tremors, sensory change, speech change, focal weakness, loss of consciousness and headaches.  Endo/Heme/Allergies: Negative for environmental allergies and polydipsia. Does not bruise/bleed easily.  Psychiatric/Behavioral: Negative for suicidal ideas. The patient is not nervous/anxious.      Past Medical History  Diagnosis Date  . Dysplasia     Multifocal genital, VIN and VAIN  . Arthritis   . Hypertension   . Osteoporosis   . History of anal fissures   . RA (rheumatoid arthritis)   . Bruises easily   . Nocturia   . Smokers' cough   . Depression   . Anxiety   . COPD (chronic obstructive pulmonary disease)   . Short of breath on exertion   . LBBB (left bundle branch block) W/ PROLONGED PR  CARDIOLOGSIT-  DR Eden Emms-   LOV IN EPIC    Past Surgical History  Procedure Date  . Wide local excision of labia majora 04-04-2010    Right-sided lesion, CO2 ablation of right labia minora  . Excision vulvar lesions   . Appendectomy 1992  . Vaginal hysterectomy  1997  . Repair fistula in ano/  i & d perirectal abscess 10-26-2002  DR Luretha Murphy  . Cardiovascular stress test 06-23-2012  DR [name]    NORMAL LEXISCAN MYOVIEW/ EF 83%  . Transthoracic echocardiogram 06-23-2012    NORMAL LVSF/ EF 55-60%  . Co2 laser application 07/22/2012    Procedure: CO2 LASER APPLICATION;  Surgeon: Laurette Schimke, MD PHD;  Location: [facility];  Service: Gynecology;  Laterality: N/A;  Laser Vaporization    Social History:  reports that she has been smoking Cigarettes.  She has a 25 pack-year smoking history. She has never used smokeless tobacco. She reports that she drinks about 7.2 ounces of alcohol per week. She reports that she does not use illicit drugs.  Allergies  Allergen Reactions  . Latex Swelling and Other (See Comments)     fever blisters  . Lactose Intolerance (Gi)   . Prinivil (Lisinopril) Diarrhea  . Triamcinolone Acetonide Other (See Comments)    REDNESS AND PAIN  . Amlodipine Rash  . Lotensin (Benazepril Hcl) Rash    Family History  Problem Relation Age of Onset  . Diabetes Mother   . Hypertension Father   . Lymphoma Son     Prior to Admission medications   Medication Sig Start Date End Date Taking? Authorizing Provider  alendronate (FOSAMAX) 70 MG tablet Take 70 mg by mouth every 7 (seven) days. Take with a full glass of water on an empty stomach. Saturday   Yes Historical Provider, MD  aspirin 81 MG tablet Take 81 mg by mouth every morning.    Yes Historical Provider, MD  buPROPion (WELLBUTRIN SR) 150 MG 12 hr tablet Take 150 mg by mouth daily.   Yes Historical Provider, MD  Calcium Carbonate-Vitamin D (CALTRATE 600+D PO) Take 1 tablet by mouth every morning.    Yes Historical Provider, MD  Cholecalciferol (VITAMIN D3) 1000 UNITS CAPS Take 1 capsule by mouth daily.    Yes Historical Provider, MD  ciprofloxacin (CIPRO) 500 MG tablet Take 500 mg by mouth 2 (two) times daily. For 10 days. Started on 09-21-12   Yes  Historical Provider, MD  diltiazem (TIAZAC) 360 MG 24 hr capsule Take 360 mg by mouth every morning.   Yes Historical Provider, MD  etanercept (ENBREL) 50 MG/ML injection Inject 50 mg into the skin once a week. TUESDAYS   Yes Historical Provider, MD  hyoscyamine (LEVSIN, ANASPAZ) 0.125 MG tablet Take 0.125 mg by mouth every 4 (four) hours as needed. Stomach cramps   Yes Historical Provider, MD  Lysine 500 MG TABS Take 1 tablet by mouth 2 (two) times daily.   Yes Historical Provider, MD  PREDNISONE PO Take 2.5 mg by mouth every morning.    Yes Historical Provider, MD  PRENATAL VITAMINS PO Take 1 tablet by mouth daily.   Yes Historical Provider, MD    Physical Exam: Filed Vitals:   09/22/12 0926  BP: 132/70  Pulse: 85  Temp: 97.9 F (36.6 C)  Resp: 18  SpO2: 100%    Physical Exam  Constitutional: Appears well-developed and well-nourished. No distress.  HENT: Normocephalic. External right and left ear normal. Oropharynx is clear and moist.  Eyes: Conjunctivae and EOM are normal. PERRLA, no scleral icterus.  Neck: Normal ROM. Neck supple. No JVD. No tracheal deviation. No thyromegaly.  CVS: RRR, S1/S2 +, no murmurs, no gallops, no carotid bruit.  Pulmonary: Effort and breath sounds normal, no stridor, rhonchi, wheezes, rales.  Abdominal: Soft. BS +,  no distension, tenderness, rebound or guarding.  Musculoskeletal: Normal range of motion. No edema and no tenderness.  Lymphadenopathy: No lymphadenopathy noted, cervical, inguinal. Neuro: Alert. Normal reflexes, muscle tone coordination. No cranial nerve deficit. Skin: Skin is warm and dry. No rash noted. Not diaphoretic. No erythema. No pallor.  Psychiatric: Normal mood and affect. Behavior, judgment, thought content normal.   Labs on Admission:  Basic Metabolic Panel:  Lab 09/22/12 9604  NA 128*  K 2.3*  CL 93*  CO2 20  GLUCOSE 91  BUN 12  CREATININE 1.47*  CALCIUM 10.1  MG --  PHOS --   Liver Function Tests:  Lab  09/22/12 1030  AST 27  ALT 20  ALKPHOS 48  BILITOT 0.4  PROT 7.6  ALBUMIN 3.8    Lab 09/22/12 1030  LIPASE 20  AMYLASE --   CBC:  Lab 09/22/12 1030  WBC 4.3  NEUTROABS 2.4  HGB 12.2  HCT 33.5*  MCV 98.5  PLT 211   Radiological Exams on Admission: No results found.  EKG: Normal sinus rhythm, no ST/T wave changes  Debbora Presto, MD  [facility] Pager [phone]  If 7PM-7AM, please contact night-coverage [email or web address] Password [facility] 09/22/2012, 1:08 PM

## 2012-09-22 NOTE — ED Notes (Signed)
Pt states that she has been having abd cramping and diarrhea x 2 months.  States that she has been to her doctor twice, most recently being yesterday.  Dr states that she might have IBS but wants her to be evaluated here.  States that she had vulvar dysplasia surgery in late November and started feeling bad after the surgery.

## 2012-09-22 NOTE — Progress Notes (Signed)
PHARMACIST - PHYSICIAN COMMUNICATION  CONCERNING: P&T Medication Policy Regarding Oral Bisphosphonates  RECOMMENDATION: Your order for alendronate (Fosamax) has been discontinued at this time.  If the patient's post-hospital medical condition warrants safe use of this class of drugs, please resume the pre-hospital regimen upon discharge.  DESCRIPTION:  Alendronate (Fosamax), ibandronate (Boniva), and risedronate (Actonel) can cause severe esophageal erosions in patients who are unable to remain upright at least 30 minutes after taking this medication.   Since brief interruptions in therapy are thought to have minimal impact on bone mineral density, the Pharmacy & Therapeutics Committee has established that bisphosphonate orders should be routinely discontinued during hospitalization.   To override this safety policy and permit administration of Boniva, Fosamax, or Actonel in the hospital, prescribers must write "DO NOT HOLD" in the comments section when placing the order for this class of medications.  Charolotte Eke, PharmD, pager [phone]. 09/22/2012,8:02 PM.

## 2012-09-23 DIAGNOSIS — R197 Diarrhea, unspecified: Principal | ICD-10-CM

## 2012-09-23 LAB — CBC
HCT: 27.7 % — ABNORMAL LOW (ref 36.0–46.0)
Platelets: 181 10*3/uL (ref 150–400)
RDW: 14.4 % (ref 11.5–15.5)
WBC: 4.7 10*3/uL (ref 4.0–10.5)

## 2012-09-23 LAB — BASIC METABOLIC PANEL
Chloride: 103 mEq/L (ref 96–112)
Creatinine, Ser: 1.22 mg/dL — ABNORMAL HIGH (ref 0.50–1.10)
GFR calc Af Amer: 52 mL/min — ABNORMAL LOW (ref >90)
GFR calc non Af Amer: 45 mL/min — ABNORMAL LOW (ref >90)

## 2012-09-23 LAB — URINE CULTURE: Culture: NO GROWTH

## 2012-09-23 MED ORDER — POTASSIUM CHLORIDE CRYS ER 20 MEQ PO TBCR
40.0000 meq | EXTENDED_RELEASE_TABLET | Freq: Once | ORAL | Status: AC
  Administered 2012-09-23: 40 meq via ORAL
  Filled 2012-09-23: qty 2

## 2012-09-23 MED ORDER — HYDROCORTISONE SOD SUCCINATE 100 MG IJ SOLR
25.0000 mg | Freq: Three times a day (TID) | INTRAMUSCULAR | Status: DC
  Administered 2012-09-23 – 2012-09-25 (×6): 25 mg via INTRAVENOUS
  Filled 2012-09-23 (×9): qty 0.5

## 2012-09-23 NOTE — Progress Notes (Addendum)
[facility] PROGRESS NOTE  Regina Ortiz OZH:[id number] DOB: 24-Aug-1946 DOA: 09/22/2012 PCP: Carilyn Goodpasture, PA  Assessment/Plan: Diarrhea  - unclear etiology at this time  - check C. Diff by PCR, stool for ova and parasites  - supportive care with IVF, antiemetics and analgesia as needed  - hold of on CT abd as pt has acute renal failure and imaging with contrast are better for assessment  - CT abdomen may be warranted once renal function stable and if no improvement noted   Reported alcohol abuse  Adrenal insuff- new diagnosis -blood pressure ok -will give a few days of stress dose steroids Hyponatremia  - secondary to dehydration  - will provide IVF  -trend BMP  Hypokalemia  - secondary to diarrhea  - will supplement: MG level ok - trend BMP  ARF (acute renal failure)  - likely of pre renal etiology as well  - IVF as noted above  - encourage PO intake  - BMP in AM   UTI  - pt has no specific urinary symptoms, but UA is suggestive of either chronic colonization or an acute infection  - will treat empirically with Rocephin and will obtain urine culture for further evaluation    Code Status: full Family Communication: patient at bedside Disposition Plan: home 1-2 days   Consultants:  none  Procedures:  none  Antibiotics:  Rocephin 1/22  HPI/Subjective: Still with loose stools- improved but not resolved yet Eating full meals  Objective: Filed Vitals:   09/22/12 1804 09/22/12 1915 09/22/12 2059 09/23/12 0707  BP: 120/65 141/72 149/68 136/73  Pulse: 80 86 88 87  Temp: 97.5 F (36.4 C) 98.9 F (37.2 C) 98.1 F (36.7 C) 98.5 F (36.9 C)  TempSrc: Axillary Oral Oral Oral  Resp: 18 20 18 18   Height:  5\' 3"  (1.6 m)    Weight:  41.7 kg (91 lb 14.9 oz)  41.8 kg (92 lb 2.4 oz)  SpO2: 95% 100% 100% 100%    Intake/Output Summary (Last 24 hours) at 09/23/12 0910 Last data filed at 09/23/12 0622  Gross per 24 hour  Intake 3356.67 ml  Output     425 ml  Net 2931.67 ml   Filed Weights   09/22/12 1915 09/23/12 0707  Weight: 41.7 kg (91 lb 14.9 oz) 41.8 kg (92 lb 2.4 oz)    Exam:   General:  A+Ox3, NAD  Cardiovascular: rrr  Respiratory: clear anterior  Abdomen: +BS, soft, NT/ND  Data Reviewed: Basic Metabolic Panel:  Lab 09/23/12 6295 09/22/12 2107 09/22/12 1030  NA 132* 128* 128*  K 3.3* 3.0* 2.3*  CL 103 96 93*  CO2 20 20 20   GLUCOSE 91 97 91  BUN 10 11 12   CREATININE 1.22* 1.18* 1.47*  CALCIUM 8.4 8.9 10.1  MG -- 1.7 --  PHOS -- -- --   Liver Function Tests:  Lab 09/22/12 1030  AST 27  ALT 20  ALKPHOS 48  BILITOT 0.4  PROT 7.6  ALBUMIN 3.8    Lab 09/22/12 1030  LIPASE 20  AMYLASE --   No results found for this basename: AMMONIA:5 in the last 168 hours CBC:  Lab 09/23/12 0430 09/22/12 1030  WBC 4.7 4.3  NEUTROABS -- 2.4  HGB 10.0* 12.2  HCT 27.7* 33.5*  MCV 99.6 98.5  PLT 181 211   Cardiac Enzymes: No results found for this basename: CKTOTAL:5,CKMB:5,CKMBINDEX:5,TROPONINI:5 in the last 168 hours BNP (last 3 results)  Basename 06/22/12 1836  PROBNP 388.1*   CBG:  No results found for this basename: GLUCAP:5 in the last 168 hours  No results found for this or any previous visit (from the past 240 hour(s)).   Studies: No results found.  Scheduled Meds:   . aspirin  81 mg Oral q morning - 10a  . buPROPion  150 mg Oral Daily  . cefTRIAXone (ROCEPHIN)  IV  1 g Intravenous Q24H  . diltiazem  360 mg Oral q morning - 10a  . enoxaparin (LOVENOX) injection  30 mg Subcutaneous Q24H  . etanercept  50 mg Subcutaneous Weekly  . potassium chloride  40 mEq Oral Daily  . potassium chloride  40 mEq Oral Once  . predniSONE  2.5 mg Oral Q breakfast  . sodium chloride  3 mL Intravenous Q12H   Continuous Infusions:   . sodium chloride 125 mL/hr at 09/23/12 0359    Principal Problem:  *Diarrhea Active Problems:  Hyponatremia  Hypokalemia  ARF (acute renal failure)    Time spent:  35    [facility], Taelon Bendorf  [facility] Pager [phone]. If 8PM-8AM, please contact night-coverage at [email or web address], password [facility] 09/23/2012, 9:10 AM  LOS: 1 day

## 2012-09-24 DIAGNOSIS — E876 Hypokalemia: Secondary | ICD-10-CM

## 2012-09-24 LAB — BASIC METABOLIC PANEL
CO2: 18 mEq/L — ABNORMAL LOW (ref 19–32)
Chloride: 109 mEq/L (ref 96–112)
Glucose, Bld: 127 mg/dL — ABNORMAL HIGH (ref 70–99)
Potassium: 3.9 mEq/L (ref 3.5–5.1)
Sodium: 135 mEq/L (ref 135–145)

## 2012-09-24 LAB — CBC
Hemoglobin: 9.5 g/dL — ABNORMAL LOW (ref 12.0–15.0)
Platelets: 176 10*3/uL (ref 150–400)
RBC: 2.65 MIL/uL — ABNORMAL LOW (ref 3.87–5.11)
WBC: 4.3 10*3/uL (ref 4.0–10.5)

## 2012-09-24 MED ORDER — METRONIDAZOLE 500 MG PO TABS
500.0000 mg | ORAL_TABLET | Freq: Two times a day (BID) | ORAL | Status: DC
  Administered 2012-09-24 – 2012-09-27 (×7): 500 mg via ORAL
  Filled 2012-09-24 (×8): qty 1

## 2012-09-24 NOTE — Progress Notes (Signed)
[facility] PROGRESS NOTE  KETZALY CARDELLA ZOX:[id number] DOB: 05-Feb-1946 DOA: 09/22/2012 PCP: Carilyn Goodpasture, PA  Assessment/Plan: Diarrhea  - unclear etiology at this time - ? If related to recent adrenal insuff diagnosis -  C. Diff by PCR neg, stool for ova and parasites NGTD - supportive care with IVF, antiemetics and analgesia as needed  - consult GI  Reported alcohol abuse - no sign of withdrawal  Adrenal insuff- ? new diagnosis -blood pressure ok -will give a few days of stress dose steroids  Hyponatremia  - secondary to dehydration  - will provide IVF  -trend BMP  Hypokalemia  - secondary to diarrhea  - will supplement: MG level ok - trend BMP  ARF (acute renal failure)  - likely of pre renal etiology as well  - IVF as noted above  - resolved  UTI  - d/c rocephin- no growth on microbiology   Code Status: full Family Communication: patient at bedside Disposition Plan: home 1-2 days   Consultants:  none  Procedures:  none  Antibiotics:  Rocephin 1/22  HPI/Subjective: Still with loose stools- improved but not resolved yet Eating full meals No nausea  Objective: Filed Vitals:   09/23/12 0707 09/23/12 1452 09/23/12 2131 09/24/12 0441  BP: 136/73 142/69 134/68 119/63  Pulse: 87 98 81 74  Temp: 98.5 F (36.9 C) 98 F (36.7 C) 97.9 F (36.6 C) 98.1 F (36.7 C)  TempSrc: Oral Oral Oral Oral  Resp: 18 16 18 18   Height:      Weight: 41.8 kg (92 lb 2.4 oz)   42.457 kg (93 lb 9.6 oz)  SpO2: 100% 100% 100% 100%    Intake/Output Summary (Last 24 hours) at 09/24/12 1052 Last data filed at 09/24/12 0752  Gross per 24 hour  Intake 2612.5 ml  Output   1275 ml  Net 1337.5 ml   Filed Weights   09/22/12 1915 09/23/12 0707 09/24/12 0441  Weight: 41.7 kg (91 lb 14.9 oz) 41.8 kg (92 lb 2.4 oz) 42.457 kg (93 lb 9.6 oz)    Exam:   General:  A+Ox3, NAD  Cardiovascular: rrr  Respiratory: clear anterior  Abdomen: +BS, soft,  NT/ND  Data Reviewed: Basic Metabolic Panel:  Lab 09/24/12 8119 09/23/12 0430 09/22/12 2107 09/22/12 1030  NA 135 132* 128* 128*  K 3.9 3.3* 3.0* 2.3*  CL 109 103 96 93*  CO2 18* 20 20 20   GLUCOSE 127* 91 97 91  BUN 5* 10 11 12   CREATININE 0.91 1.22* 1.18* 1.47*  CALCIUM 7.3* 8.4 8.9 10.1  MG -- -- 1.7 --  PHOS -- -- -- --   Liver Function Tests:  Lab 09/22/12 1030  AST 27  ALT 20  ALKPHOS 48  BILITOT 0.4  PROT 7.6  ALBUMIN 3.8    Lab 09/22/12 1030  LIPASE 20  AMYLASE --   No results found for this basename: AMMONIA:5 in the last 168 hours CBC:  Lab 09/24/12 0420 09/23/12 0430 09/22/12 1030  WBC 4.3 4.7 4.3  NEUTROABS -- -- 2.4  HGB 9.5* 10.0* 12.2  HCT 26.6* 27.7* 33.5*  MCV 100.4* 99.6 98.5  PLT 176 181 211   Cardiac Enzymes: No results found for this basename: CKTOTAL:5,CKMB:5,CKMBINDEX:5,TROPONINI:5 in the last 168 hours BNP (last 3 results)  Basename 06/22/12 1836  PROBNP 388.1*   CBG: No results found for this basename: GLUCAP:5 in the last 168 hours  Recent Results (from the past 240 hour(s))  URINE CULTURE     Status:  Normal   Collection Time   09/22/12 11:00 AM      Component Value Range Status Comment   Specimen Description URINE, CLEAN CATCH   Final    Special Requests NONE   Final    Culture  Setup Time 09/22/2012 15:04   Final    Colony Count NO GROWTH   Final    Culture NO GROWTH   Final    Report Status 09/23/2012 FINAL   Final   CLOSTRIDIUM DIFFICILE BY PCR     Status: Normal   Collection Time   09/22/12  1:57 PM      Component Value Range Status Comment   C difficile by pcr NEGATIVE  NEGATIVE Final   STOOL CULTURE     Status: Normal (Preliminary result)   Collection Time   09/22/12  1:57 PM      Component Value Range Status Comment   Specimen Description STOOL   Final    Special Requests NONE   Final    Culture Culture reincubated for better growth   Final    Report Status PENDING   Incomplete      Studies: No results  found.  Scheduled Meds:    . aspirin  81 mg Oral q morning - 10a  . buPROPion  150 mg Oral Daily  . diltiazem  360 mg Oral q morning - 10a  . enoxaparin (LOVENOX) injection  30 mg Subcutaneous Q24H  . etanercept  50 mg Subcutaneous Weekly  . hydrocortisone sod succinate (SOLU-CORTEF) injection  25 mg Intravenous Q8H  . potassium chloride  40 mEq Oral Daily  . sodium chloride  3 mL Intravenous Q12H   Continuous Infusions:   Principal Problem:  *Diarrhea Active Problems:  Hyponatremia  Hypokalemia  ARF (acute renal failure)    Time spent: 25    Marlin Canary  [facility] Pager [phone]. If 8PM-8AM, please contact night-coverage at [email or web address], password [facility] 09/24/2012, 10:52 AM  LOS: 2 days

## 2012-09-24 NOTE — Consult Note (Signed)
[facility] Consult Note  Referring Provider: No ref. provider found Primary Care Physician:  Carilyn Goodpasture, [place] Primary Gastroenterologist:  Dr.  Antony Contras Complaint: Diarrhea of one month's duration HPI: Regina Ortiz is an 67 y.o. white female  admitted after gradually worsening diarrhea with a 10-15 pound weight loss described as nonbloody 5 or 6 times a day, some nocturnal diarrhea and some abdominal cramps mainly in the last 2 weeks. She denies any nausea or vomiting recent travel or recent antibiotics. She has not had a colonoscopy in over 10 years. She has no family history of GI malignancy except for pancreatic cancer. She has no family history of inflammatory bowel disease or celiac disease. She does think she is lactose intolerant. She's had a negative C. difficile toxin here and has been started on Cipro and thinks he may be doing somewhat better after 2 or 3 days. He also think she has gained 5 pounds of weight back since she has been in the hospital and is taking a regular diet. Past Medical History  Diagnosis Date  . Dysplasia     Multifocal genital, VIN and VAIN  . Arthritis   . Hypertension   . Osteoporosis   . History of anal fissures   . RA (rheumatoid arthritis)   . Bruises easily   . Nocturia   . Smokers' cough   . Depression   . Anxiety   . COPD (chronic obstructive pulmonary disease)   . Short of breath on exertion   . LBBB (left bundle branch block) W/ PROLONGED PR    CARDIOLOGSIT-  DR Eden Emms-   LOV IN EPIC    Past Surgical History  Procedure Date  . Wide local excision of labia majora 04-04-2010    Right-sided lesion, CO2 ablation of right labia minora  . Excision vulvar lesions   . Appendectomy 1992  . Vaginal hysterectomy 1997  . Repair fistula in ano/  i & d perirectal abscess 10-26-2002  DR Luretha Murphy  . Cardiovascular stress test 06-23-2012  DR [name]    NORMAL LEXISCAN MYOVIEW/ EF 83%  . Transthoracic echocardiogram 06-23-2012     NORMAL LVSF/ EF 55-60%  . Co2 laser application 07/22/2012    Procedure: CO2 LASER APPLICATION;  Surgeon: Laurette Schimke, MD PHD;  Location: [facility];  Service: Gynecology;  Laterality: N/A;  Laser Vaporization    Medications Prior to Admission  Medication Sig Dispense Refill  . alendronate (FOSAMAX) 70 MG tablet Take 70 mg by mouth every 7 (seven) days. Take with a full glass of water on an empty stomach. Saturday      . aspirin 81 MG tablet Take 81 mg by mouth every morning.       Marland Kitchen buPROPion (WELLBUTRIN SR) 150 MG 12 hr tablet Take 150 mg by mouth daily.      . Calcium Carbonate-Vitamin D (CALTRATE 600+D PO) Take 1 tablet by mouth every morning.       . Cholecalciferol (VITAMIN D3) 1000 UNITS CAPS Take 1 capsule by mouth daily.       . ciprofloxacin (CIPRO) 500 MG tablet Take 500 mg by mouth 2 (two) times daily. For 10 days. Started on 09-21-12      . diltiazem (TIAZAC) 360 MG 24 hr capsule Take 360 mg by mouth every morning.      . etanercept (ENBREL) 50 MG/ML injection Inject 50 mg into the skin once a week. TUESDAYS      . hyoscyamine (LEVSIN, ANASPAZ) 0.125 MG  tablet Take 0.125 mg by mouth every 4 (four) hours as needed. Stomach cramps      . Lysine 500 MG TABS Take 1 tablet by mouth 2 (two) times daily.      Marland Kitchen PREDNISONE PO Take 2.5 mg by mouth every morning.       Marland Kitchen PRENATAL VITAMINS PO Take 1 tablet by mouth daily.        Allergies:  Allergies  Allergen Reactions  . Latex Swelling and Other (See Comments)     fever blisters  . Lactose Intolerance (Gi)   . Prinivil (Lisinopril) Diarrhea  . Triamcinolone Acetonide Other (See Comments)    REDNESS AND PAIN  . Amlodipine Rash  . Lotensin (Benazepril Hcl) Rash    Family History  Problem Relation Age of Onset  . Diabetes Mother   . Hypertension Father   . Lymphoma Son     Social History:  reports that she has been smoking Cigarettes.  She has a 25 pack-year smoking history. She has never used smokeless  tobacco. She reports that she drinks about 7.2 ounces of alcohol per week. She reports that she does not use illicit drugs.  Review of Systems: negative except as above   Blood pressure 119/63, pulse 74, temperature 98.1 F (36.7 C), temperature source Oral, resp. rate 18, height 5\' 3"  (1.6 m), weight 42.457 kg (93 lb 9.6 oz), SpO2 100.00%. Head: Normocephalic, without obvious abnormality, atraumatic Neck: no adenopathy, no carotid bruit, no JVD, supple, symmetrical, trachea midline and thyroid not enlarged, symmetric, no tenderness/mass/nodules Resp: clear to auscultation bilaterally Cardio: regular rate and rhythm, S1, S2 normal, no murmur, click, rub or gallop GI: As above Extremities: extremities normal, atraumatic, no cyanosis or edema  Results for orders placed during the hospital encounter of 09/22/12 (from the past 48 hour(s))  CLOSTRIDIUM DIFFICILE BY PCR     Status: Normal   Collection Time   09/22/12  1:57 PM      Component Value Range Comment   C difficile by pcr NEGATIVE  NEGATIVE   STOOL CULTURE     Status: Normal (Preliminary result)   Collection Time   09/22/12  1:57 PM      Component Value Range Comment   Specimen Description STOOL      Special Requests NONE      Culture        Value: NO SUSPICIOUS COLONIES, CONTINUING TO HOLD     Note: REDUCED NORMAL FLORA PRESENT   Report Status PENDING     BASIC METABOLIC PANEL     Status: Abnormal   Collection Time   09/22/12  9:07 PM      Component Value Range Comment   Sodium 128 (*) 135 - 145 mEq/L    Potassium 3.0 (*) 3.5 - 5.1 mEq/L    Chloride 96  96 - 112 mEq/L    CO2 20  19 - 32 mEq/L    Glucose, Bld 97  70 - 99 mg/dL    BUN 11  6 - 23 mg/dL    Creatinine, Ser 0.98 (*) 0.50 - 1.10 mg/dL    Calcium 8.9  8.4 - 11.9 mg/dL    GFR calc non Af Amer 47 (*) >90 mL/min    GFR calc Af Amer 54 (*) >90 mL/min   MAGNESIUM     Status: Normal   Collection Time   09/22/12  9:07 PM      Component Value Range Comment   Magnesium  1.7  1.5 -  2.5 mg/dL   BASIC METABOLIC PANEL     Status: Abnormal   Collection Time   09/23/12  4:30 AM      Component Value Range Comment   Sodium 132 (*) 135 - 145 mEq/L    Potassium 3.3 (*) 3.5 - 5.1 mEq/L    Chloride 103  96 - 112 mEq/L    CO2 20  19 - 32 mEq/L    Glucose, Bld 91  70 - 99 mg/dL    BUN 10  6 - 23 mg/dL    Creatinine, Ser 1.61 (*) 0.50 - 1.10 mg/dL    Calcium 8.4  8.4 - 09.6 mg/dL    GFR calc non Af Amer 45 (*) >90 mL/min    GFR calc Af Amer 52 (*) >90 mL/min   CBC     Status: Abnormal   Collection Time   09/23/12  4:30 AM      Component Value Range Comment   WBC 4.7  4.0 - 10.5 K/uL    RBC 2.78 (*) 3.87 - 5.11 MIL/uL    Hemoglobin 10.0 (*) 12.0 - 15.0 g/dL    HCT 04.5 (*) 40.9 - 46.0 %    MCV 99.6  78.0 - 100.0 fL    MCH 36.0 (*) 26.0 - 34.0 pg    MCHC 36.1 (*) 30.0 - 36.0 g/dL    RDW 81.1  91.4 - 78.2 %    Platelets 181  150 - 400 K/uL   CBC     Status: Abnormal   Collection Time   09/24/12  4:20 AM      Component Value Range Comment   WBC 4.3  4.0 - 10.5 K/uL    RBC 2.65 (*) 3.87 - 5.11 MIL/uL    Hemoglobin 9.5 (*) 12.0 - 15.0 g/dL    HCT 95.6 (*) 21.3 - 46.0 %    MCV 100.4 (*) 78.0 - 100.0 fL    MCH 35.8 (*) 26.0 - 34.0 pg    MCHC 35.7  30.0 - 36.0 g/dL    RDW 08.6  57.8 - 46.9 %    Platelets 176  150 - 400 K/uL   BASIC METABOLIC PANEL     Status: Abnormal   Collection Time   09/24/12  4:20 AM      Component Value Range Comment   Sodium 135  135 - 145 mEq/L    Potassium 3.9  3.5 - 5.1 mEq/L    Chloride 109  96 - 112 mEq/L    CO2 18 (*) 19 - 32 mEq/L    Glucose, Bld 127 (*) 70 - 99 mg/dL    BUN 5 (*) 6 - 23 mg/dL    Creatinine, Ser 6.29  0.50 - 1.10 mg/dL    Calcium 7.3 (*) 8.4 - 10.5 mg/dL    GFR calc non Af Amer 64 (*) >90 mL/min    GFR calc Af Amer 74 (*) >90 mL/min    No results found.  Assessment: Diarrhea borderline chronic at this point, nonbloody. Questionable early response to Cipro not clear Plan:  At this point would go ahead  and add Flagyl as another empiric antibiotic to cover toxin negative C. difficile or other parasites Will also check tissue transglutaminase discrete cream for celiac disease. At some point will need colonoscopy either as outpatient after diarrhea resolved or for refractory diarrhea in hospital. Regina Ortiz C 09/24/2012, 11:48 AM

## 2012-09-25 LAB — BASIC METABOLIC PANEL
GFR calc Af Amer: 80 mL/min — ABNORMAL LOW (ref >90)
GFR calc non Af Amer: 69 mL/min — ABNORMAL LOW (ref >90)
Glucose, Bld: 120 mg/dL — ABNORMAL HIGH (ref 70–99)
Potassium: 3.1 mEq/L — ABNORMAL LOW (ref 3.5–5.1)
Sodium: 131 mEq/L — ABNORMAL LOW (ref 135–145)

## 2012-09-25 LAB — FECAL LACTOFERRIN, QUANT: Fecal Lactoferrin: NEGATIVE

## 2012-09-25 LAB — CBC
Hemoglobin: 10.4 g/dL — ABNORMAL LOW (ref 12.0–15.0)
MCHC: 36 g/dL (ref 30.0–36.0)
RDW: 14.7 % (ref 11.5–15.5)
WBC: 6.1 10*3/uL (ref 4.0–10.5)

## 2012-09-25 MED ORDER — POTASSIUM CHLORIDE CRYS ER 20 MEQ PO TBCR
40.0000 meq | EXTENDED_RELEASE_TABLET | ORAL | Status: AC
  Administered 2012-09-25 (×3): 40 meq via ORAL
  Filled 2012-09-25 (×3): qty 2

## 2012-09-25 MED ORDER — PREDNISONE 2.5 MG PO TABS
2.5000 mg | ORAL_TABLET | Freq: Every day | ORAL | Status: DC
  Administered 2012-09-26 – 2012-09-27 (×2): 2.5 mg via ORAL
  Filled 2012-09-25 (×3): qty 1

## 2012-09-25 NOTE — Progress Notes (Signed)
[facility] PROGRESS NOTE  Regina Ortiz AVW:[id number] DOB: 09-29-1945 DOA: 09/22/2012 PCP: Carilyn Goodpasture, PA  Assessment/Plan: Diarrhea  - unclear etiology at this time - ? If related to recent adrenal insuff diagnosis -  C. Diff by PCR neg, stool for ova and parasites NGTD - supportive care with IVF, antiemetics and analgesia as needed  - consult GI- appreciate recs- flagyl started  Reported alcohol abuse - no sign of withdrawal  Adrenal insuff- ? new diagnosis -blood pressure ok -will give a few days of stress dose steroids  Hyponatremia  - has had long term problems baseline upper 120s/low 130s - will provide IVF  -trend BMP  Hypokalemia  - secondary to diarrhea  - will supplement: MG level ok - trend BMP  ARF (acute renal failure)  - likely of pre renal etiology as well  - IVF as noted above  - resolved  UTI  - d/c rocephin- no growth on microbiology   Code Status: full Family Communication: patient at bedside Disposition Plan: home 1-2 days   Consultants:  GI  Procedures:  none  Antibiotics:  Rocephin 1/22  HPI/Subjective: # of BMs lessening- still watery Eating full meals No nausea  Objective: Filed Vitals:   09/24/12 0441 09/24/12 1354 09/25/12 0107 09/25/12 0457  BP: 119/63 146/66 136/69 125/59  Pulse: 74 79 80 77  Temp: 98.1 F (36.7 C) 99 F (37.2 C) 98 F (36.7 C) 98.2 F (36.8 C)  TempSrc: Oral Oral Oral Oral  Resp: 18 18 14    Height:      Weight: 42.457 kg (93 lb 9.6 oz)   43.954 kg (96 lb 14.4 oz)  SpO2: 100% 100% 98% 100%    Intake/Output Summary (Last 24 hours) at 09/25/12 1029 Last data filed at 09/25/12 0508  Gross per 24 hour  Intake    700 ml  Output    800 ml  Net   -100 ml   Filed Weights   09/23/12 0707 09/24/12 0441 09/25/12 0457  Weight: 41.8 kg (92 lb 2.4 oz) 42.457 kg (93 lb 9.6 oz) 43.954 kg (96 lb 14.4 oz)    Exam:   General:  A+Ox3, NAD  Cardiovascular: rrr  Respiratory: clear  anterior  Abdomen: +BS, soft, NT/ND  Data Reviewed: Basic Metabolic Panel:  Lab 09/25/12 8295 09/24/12 0420 09/23/12 0430 09/22/12 2107 09/22/12 1030  NA 131* 135 132* 128* 128*  K 3.1* 3.9 3.3* 3.0* 2.3*  CL 105 109 103 96 93*  CO2 19 18* 20 20 20   GLUCOSE 120* 127* 91 97 91  BUN 5* 5* 10 11 12   CREATININE 0.85 0.91 1.22* 1.18* 1.47*  CALCIUM 7.4* 7.3* 8.4 8.9 10.1  MG -- -- -- 1.7 --  PHOS -- -- -- -- --   Liver Function Tests:  Lab 09/22/12 1030  AST 27  ALT 20  ALKPHOS 48  BILITOT 0.4  PROT 7.6  ALBUMIN 3.8    Lab 09/22/12 1030  LIPASE 20  AMYLASE --   No results found for this basename: AMMONIA:5 in the last 168 hours CBC:  Lab 09/25/12 0513 09/24/12 0420 09/23/12 0430 09/22/12 1030  WBC 6.1 4.3 4.7 4.3  NEUTROABS -- -- -- 2.4  HGB 10.4* 9.5* 10.0* 12.2  HCT 28.9* 26.6* 27.7* 33.5*  MCV 100.7* 100.4* 99.6 98.5  PLT 188 176 181 211   Cardiac Enzymes: No results found for this basename: CKTOTAL:5,CKMB:5,CKMBINDEX:5,TROPONINI:5 in the last 168 hours BNP (last 3 results)  Basename 06/22/12 1836  PROBNP  388.1*   CBG: No results found for this basename: GLUCAP:5 in the last 168 hours  Recent Results (from the past 240 hour(s))  URINE CULTURE     Status: Normal   Collection Time   09/22/12 11:00 AM      Component Value Range Status Comment   Specimen Description URINE, CLEAN CATCH   Final    Special Requests NONE   Final    Culture  Setup Time 09/22/2012 15:04   Final    Colony Count NO GROWTH   Final    Culture NO GROWTH   Final    Report Status 09/23/2012 FINAL   Final   CLOSTRIDIUM DIFFICILE BY PCR     Status: Normal   Collection Time   09/22/12  1:57 PM      Component Value Range Status Comment   C difficile by pcr NEGATIVE  NEGATIVE Final   STOOL CULTURE     Status: Normal (Preliminary result)   Collection Time   09/22/12  1:57 PM      Component Value Range Status Comment   Specimen Description STOOL   Final    Special Requests NONE   Final     Culture     Final    Value: NO SUSPICIOUS COLONIES, CONTINUING TO HOLD     Note: REDUCED NORMAL FLORA PRESENT   Report Status PENDING   Incomplete      Studies: No results found.  Scheduled Meds:    . aspirin  81 mg Oral q morning - 10a  . buPROPion  150 mg Oral Daily  . diltiazem  360 mg Oral q morning - 10a  . enoxaparin (LOVENOX) injection  30 mg Subcutaneous Q24H  . etanercept  50 mg Subcutaneous Weekly  . metroNIDAZOLE  500 mg Oral Q12H  . potassium chloride  40 mEq Oral Daily  . potassium chloride  40 mEq Oral Q2H  . predniSONE  2.5 mg Oral Q breakfast  . sodium chloride  3 mL Intravenous Q12H   Continuous Infusions:   Principal Problem:  *Diarrhea Active Problems:  Hyponatremia  Hypokalemia  ARF (acute renal failure)    Time spent: 25    Marlin Canary  [facility] Pager [phone]. If 8PM-8AM, please contact night-coverage at [email or web address], password [facility] 09/25/2012, 10:29 AM  LOS: 3 days

## 2012-09-25 NOTE — Progress Notes (Signed)
Patient ID: Regina Ortiz, female   DOB: 01-Apr-1946, 67 y.o.   MRN: [id number] [facility] Progress Note  NAVAEH KEHRES 67 y.o. 1945/11/28   Subjective: Bowel movements starting to have small amounts of form and brown color. Tolerating diet.  Objective: Vital signs in last 24 hours: Filed Vitals:   09/25/12 0457  BP: 125/59  Pulse: 77  Temp: 98.2 F (36.8 C)  Resp:     Physical Exam: Gen: alert, no acute distress Abd: minimal LLQ and suprapubic tenderness without guarding, soft, nondistended, +BS  Lab Results:  Basename 09/25/12 0513 09/24/12 0420 09/22/12 2107  NA 131* 135 --  K 3.1* 3.9 --  CL 105 109 --  CO2 19 18* --  GLUCOSE 120* 127* --  BUN 5* 5* --  CREATININE 0.85 0.91 --  CALCIUM 7.4* 7.3* --  MG 1.5 -- 1.7  PHOS -- -- --   No results found for this basename: AST:2,ALT:2,ALKPHOS:2,BILITOT:2,PROT:2,ALBUMIN:2 in the last 72 hours  Basename 09/25/12 0513 09/24/12 0420  WBC 6.1 4.3  NEUTROABS -- --  HGB 10.4* 9.5*  HCT 28.9* 26.6*  MCV 100.7* 100.4*  PLT 188 176   No results found for this basename: LABPROT:2,INR:2 in the last 72 hours    Assessment/Plan: Diarrhea question infection. Improving since addition of Flagyl. GI pathogen panel pending. Continue supportive care. Will hold off on inpt colonoscopy since diarrhea improving. If diarrhea resolved tomorrow then possible D/C Sunday and will do colonoscopy as outpt per Dr. Madilyn Fireman discretion. Will check TTG.   Kathryn Linarez C. 09/25/2012, 1:15 PM

## 2012-09-26 LAB — BASIC METABOLIC PANEL
Calcium: 7.7 mg/dL — ABNORMAL LOW (ref 8.4–10.5)
GFR calc non Af Amer: 87 mL/min — ABNORMAL LOW (ref >90)
Sodium: 129 mEq/L — ABNORMAL LOW (ref 135–145)

## 2012-09-26 LAB — STOOL CULTURE

## 2012-09-26 NOTE — Progress Notes (Signed)
[facility] PROGRESS NOTE  Regina Ortiz WUJ:[id number] DOB: [birth year] DOA: 09/22/2012 PCP: Carilyn Goodpasture, PA  Assessment/Plan: Diarrhea  - unclear etiology at this time - ? If related to recent adrenal insuff diagnosis -  C. Diff by PCR neg, stool for ova and parasites NGTD - supportive care with IVF, antiemetics and analgesia as needed  - consult GI- appreciate recs- flagyl started- outpatient colonoscopy - patient now reporting that her normal BMs since childhood are pretty soft/liquid 2-3 times/day  Reported alcohol abuse - no sign of withdrawal  Adrenal insuff- ? new diagnosis -blood pressure ok -will give a few days of stress dose steroids  Hyponatremia  - has had long term problems baseline upper 120s/low 130s - will provide IVF  -trend BMP  Hypokalemia  - secondary to diarrhea  - will supplement: MG level ok - trend BMP  ARF (acute renal failure)  - likely of pre renal etiology as well  - IVF as noted above  - resolved  UTI  - d/c rocephin- no growth on microbiology   Code Status: full Family Communication: patient at bedside Disposition Plan: home soon   Consultants:  GI  Procedures:  none  Antibiotics:  Rocephin 1/22  HPI/Subjective: BMs lessening- still watery Eating full meals No nausea  Objective: Filed Vitals:   09/25/12 0457 09/25/12 1404 09/25/12 2050 09/26/12 0542  BP: 125/59 122/55 132/63 124/64  Pulse: 77 80 77 76  Temp: 98.2 F (36.8 C) 98.4 F (36.9 C) 98.1 F (36.7 C) 98.2 F (36.8 C)  TempSrc: Oral Oral Oral Oral  Resp:  16 16 16   Height:      Weight: 43.954 kg (96 lb 14.4 oz)   43.727 kg (96 lb 6.4 oz)  SpO2: 100% 100% 100% 98%    Intake/Output Summary (Last 24 hours) at 09/26/12 0839 Last data filed at 09/26/12 0700  Gross per 24 hour  Intake   1420 ml  Output   1759 ml  Net   -339 ml   Filed Weights   09/24/12 0441 09/25/12 0457 09/26/12 0542  Weight: 42.457 kg (93 lb 9.6 oz) 43.954 kg (96 lb  14.4 oz) 43.727 kg (96 lb 6.4 oz)    Exam:   General:  A+Ox3, NAD  Cardiovascular: rrr  Respiratory: clear anterior  Abdomen: +BS, soft, NT/ND  Data Reviewed: Basic Metabolic Panel:  Lab 09/25/12 9562 09/24/12 0420 09/23/12 0430 09/22/12 2107 09/22/12 1030  NA 131* 135 132* 128* 128*  K 3.1* 3.9 3.3* 3.0* 2.3*  CL 105 109 103 96 93*  CO2 19 18* 20 20 20   GLUCOSE 120* 127* 91 97 91  BUN 5* 5* 10 11 12   CREATININE 0.85 0.91 1.22* 1.18* 1.47*  CALCIUM 7.4* 7.3* 8.4 8.9 10.1  MG 1.5 -- -- 1.7 --  PHOS -- -- -- -- --   Liver Function Tests:  Lab 09/22/12 1030  AST 27  ALT 20  ALKPHOS 48  BILITOT 0.4  PROT 7.6  ALBUMIN 3.8    Lab 09/22/12 1030  LIPASE 20  AMYLASE --   No results found for this basename: AMMONIA:5 in the last 168 hours CBC:  Lab 09/25/12 0513 09/24/12 0420 09/23/12 0430 09/22/12 1030  WBC 6.1 4.3 4.7 4.3  NEUTROABS -- -- -- 2.4  HGB 10.4* 9.5* 10.0* 12.2  HCT 28.9* 26.6* 27.7* 33.5*  MCV 100.7* 100.4* 99.6 98.5  PLT 188 176 181 211   Cardiac Enzymes: No results found for this basename: CKTOTAL:5,CKMB:5,CKMBINDEX:5,TROPONINI:5 in the last  168 hours BNP (last 3 results)  Basename 06/22/12 1836  PROBNP 388.1*   CBG: No results found for this basename: GLUCAP:5 in the last 168 hours  Recent Results (from the past 240 hour(s))  URINE CULTURE     Status: Normal   Collection Time   09/22/12 11:00 AM      Component Value Range Status Comment   Specimen Description URINE, CLEAN CATCH   Final    Special Requests NONE   Final    Culture  Setup Time 09/22/2012 15:04   Final    Colony Count NO GROWTH   Final    Culture NO GROWTH   Final    Report Status 09/23/2012 FINAL   Final   CLOSTRIDIUM DIFFICILE BY PCR     Status: Normal   Collection Time   09/22/12  1:57 PM      Component Value Range Status Comment   C difficile by pcr NEGATIVE  NEGATIVE Final   STOOL CULTURE     Status: Normal (Preliminary result)   Collection Time   09/22/12  1:57  PM      Component Value Range Status Comment   Specimen Description STOOL   Final    Special Requests NONE   Final    Culture     Final    Value: NO SUSPICIOUS COLONIES, CONTINUING TO HOLD     Note: REDUCED NORMAL FLORA PRESENT   Report Status PENDING   Incomplete      Studies: No results found.  Scheduled Meds:    . aspirin  81 mg Oral q morning - 10a  . buPROPion  150 mg Oral Daily  . diltiazem  360 mg Oral q morning - 10a  . enoxaparin (LOVENOX) injection  30 mg Subcutaneous Q24H  . etanercept  50 mg Subcutaneous Weekly  . metroNIDAZOLE  500 mg Oral Q12H  . potassium chloride  40 mEq Oral Daily  . predniSONE  2.5 mg Oral Q breakfast  . sodium chloride  3 mL Intravenous Q12H   Continuous Infusions:   Principal Problem:  *Diarrhea Active Problems:  Hyponatremia  Hypokalemia  ARF (acute renal failure)    Time spent: 25    Marlin Canary  [facility] Pager [phone]. If 8PM-8AM, please contact night-coverage at [email or web address], password [facility] 09/26/2012, 8:39 AM  LOS: 4 days

## 2012-09-26 NOTE — Progress Notes (Signed)
No morning labs available.

## 2012-09-26 NOTE — Progress Notes (Signed)
Patient ID: Regina Ortiz, female   DOB: 05/30/1946, 67 y.o.   MRN: [id number] [facility] Gastroenterology Progress Note  Regina Ortiz 67 y.o. 03-Oct-1945   Subjective: 6 episodes of loose nonbloody stools. Tolerating diet  Objective: Vital signs in last 24 hours: Filed Vitals:   09/26/12 0542  BP: 124/64  Pulse: 76  Temp: 98.2 F (36.8 C)  Resp: 16    Physical Exam: Gen: alert, no acute distress Abd: RLQ tenderness with minimal guarding, otherwise nontender, nondistended, +BS  Lab Results:  Basename 09/26/12 0833 09/25/12 0513  NA 129* 131*  K 3.9 3.1*  CL 104 105  CO2 18* 19  GLUCOSE 86 120*  BUN 6 5*  CREATININE 0.71 0.85  CALCIUM 7.7* 7.4*  MG -- 1.5  PHOS -- --   No results found for this basename: AST:2,ALT:2,ALKPHOS:2,BILITOT:2,PROT:2,ALBUMIN:2 in the last 72 hours  Basename 09/25/12 0513 09/24/12 0420  WBC 6.1 4.3  NEUTROABS -- --  HGB 10.4* 9.5*  HCT 28.9* 26.6*  MCV 100.7* 100.4*  PLT 188 176   No results found for this basename: LABPROT:2,INR:2 in the last 72 hours    Assessment/Plan: Diarrhea - persisting but small amount of form occurring although frequency today is still high. Continue antibiotics. Awaiting GI pathogen panel results. Supportive care. Not ready to go home today due to stool frequency.    Heloise Gordan C. 09/26/2012, 12:02 PM

## 2012-09-26 NOTE — Progress Notes (Signed)
09-26-12 NSG:  Pt reveals that since she was 67 years old her ENL for bowel movements is 2 -3 loose stools a day.  She just realized she hadn't told us that.  She had 6 stools on Saturday and has had 2 since midnight.  They are medium sized brown/green liquid.  Denies nausea, abd is slightly distended, soft but tender epigastric to luq.  Pain under control  - pt is taking 2 vicodin q 4 hours.

## 2012-09-27 LAB — GI PATHOGEN PANEL BY PCR, STOOL
Campylobacter by PCR: NEGATIVE
Cryptosporidium by PCR: NEGATIVE
E coli 0157 by PCR: NEGATIVE
G lamblia by PCR: NEGATIVE
Shigella by PCR: NEGATIVE

## 2012-09-27 LAB — BASIC METABOLIC PANEL
GFR calc Af Amer: >90 mL/min (ref >90)
GFR calc non Af Amer: 86 mL/min — ABNORMAL LOW (ref >90)
Potassium: 4.2 mEq/L (ref 3.5–5.1)
Sodium: 131 mEq/L — ABNORMAL LOW (ref 135–145)

## 2012-09-27 LAB — TISSUE TRANSGLUTAMINASE, IGA: Tissue Transglutaminase Ab, IgA: 5.8 U/mL (ref <20)

## 2012-09-27 MED ORDER — POTASSIUM CHLORIDE CRYS ER 20 MEQ PO TBCR: 20.0000 meq | EXTENDED_RELEASE_TABLET | Freq: Every day | ORAL | Status: DC

## 2012-09-27 MED ORDER — SACCHAROMYCES BOULARDII 250 MG PO CAPS
250.0000 mg | ORAL_CAPSULE | Freq: Two times a day (BID) | ORAL | Status: DC
  Filled 2012-09-27 (×2): qty 1

## 2012-09-27 MED ORDER — SACCHAROMYCES BOULARDII 250 MG PO CAPS: 250.0000 mg | ORAL_CAPSULE | Freq: Two times a day (BID) | ORAL | Status: DC

## 2012-09-27 MED ORDER — METRONIDAZOLE 500 MG PO TABS: 500.0000 mg | ORAL_TABLET | Freq: Two times a day (BID) | ORAL | Status: DC

## 2012-09-27 NOTE — Progress Notes (Signed)
   CARE MANAGEMENT NOTE 09/27/2012  Patient:  Regina Ortiz, Regina Ortiz   Account Number:  [id number]  Date Initiated:  09/27/2012  Documentation initiated by:  Jiles Crocker  Subjective/Objective Assessment:   ADMITTED WITH ABDOMINAL PAIN, DIARRHEA     Action/Plan:   PCP: Carilyn Goodpasture, PA  LIVES AT HOME WITH SPOUSE   Anticipated DC Date:  09/29/2012   Anticipated DC Plan:  HOME W HOME HEALTH SERVICES      DC Planning Services  CM consult          Status of service:  In process, will continue to follow Medicare Important Message given?  NA - LOS <3 / Initial given by admissions (If response is "NO", the following Medicare IM given date fields will be blank)  Per UR Regulation:  Reviewed for med. necessity/level of care/duration of stay  Comments:  09/27/2012- B Tyler Cubit RN,BSN,MHA

## 2012-09-27 NOTE — Discharge Summary (Signed)
Physician Discharge Summary  Regina Ortiz ZOX:[id number] DOB: Oct 23, 1945 DOA: 09/22/2012  PCP: Carilyn Goodpasture, PA  Admit date: 09/22/2012 Discharge date: 09/27/2012  Time spent: 35 minutes  Recommendations for Outpatient Follow-up:  1. BMP/Mg 1 week re: K- if diarrhea decreases, may need to stop supplements of Kdur  Discharge Diagnoses:  Principal Problem:  *Diarrhea Active Problems:  Hyponatremia  Hypokalemia  ARF (acute renal failure)   Discharge Condition: improved  Diet recommendation: regular  Filed Weights   09/25/12 0457 09/26/12 0542 09/27/12 0602  Weight: 43.954 kg (96 lb 14.4 oz) 43.727 kg (96 lb 6.4 oz) 43.1 kg (95 lb 0.3 oz)    History of present illness:  Pt is 67 yo female who presents to [facility] ED with main concern of progressive, watery diarrhea that initially started 2 months prior to admission and associated with intermittent episodes of generalized abdominal pain, with cramps and 3/10 in severity when present, no specific alleviating or aggravating factors, non radiating, no changes in appetite.Pt reports seeing PCP for the problem and has had blood tests done. She was called and advised to go to ED as her potassium level was low but she in not sure what the numbers are. She denies chest pain or shortness of breath, no urinary concerns, no weight loss.  In ED, pt found to have potassium 2.6, hyponatremia and Acute renal failure. TRH asked to admit for further evaluation. Admission to telemetry required due to low potassium level   Hospital Course:  Diarrhea  - unclear etiology at this time - ? If related to recent adrenal insuff diagnosis - improved during hospital stay - C. Diff by PCR neg, stool for ova and parasites NGTD  - supportive care with IVF, antiemetics and analgesia as needed  - consult GI- appreciate recs- flagyl started- outpatient colonoscopy  - patient now reporting that her normal BMs since childhood are pretty soft/liquid 2-3 times/day    -eating well (snacks in between)  Reported alcohol abuse  - no sign of withdrawal   Adrenal insuff- ? new diagnosis  -blood pressure ok  -will give a few days of stress dose steroids   Hyponatremia  - has had long term problems baseline upper 120s/low 130s  - will provide IVF  -trend BMP   Hypokalemia  - secondary to diarrhea  - will supplement: MG level ok- lower level of normal- check again as an outpatient -check in few days  ARF (acute renal failure)  - likely of pre renal etiology as well  - IVF as noted above  - resolved   UTI  - d/c rocephin- no growth on microbiology      Procedures:    Consultations:  GI- can follow up with Dr. Madilyn Fireman in 2-3 weeks  Discharge Exam: Filed Vitals:   09/26/12 0542 09/26/12 1358 09/26/12 2026 09/27/12 0602  BP: 124/64 126/65 145/69 142/66  Pulse: 76 84 87 71  Temp: 98.2 F (36.8 C) 98.2 F (36.8 C) 98.5 F (36.9 C) 97.3 F (36.3 C)  TempSrc: Oral Oral Oral Oral  Resp: 16 20 20 20   Height:      Weight: 43.727 kg (96 lb 6.4 oz)   43.1 kg (95 lb 0.3 oz)  SpO2: 98% 100% 100% 100%    General: A+Ox3, NAD Cardiovascular: rrr Respiratory:clear  Discharge Instructions      Discharge Orders    Future Appointments: Provider: Department: Dept Phone: Center:   11/22/2012 9:30 AM Wendall Stade, MD [facility] Main Office [place]) [phone]  [email or web address]     Future Orders Please Complete By Expires   Diet general      Increase activity slowly      Discharge instructions      Comments:   BMP 1 week Outpatient coloscopy       Medication List     As of 09/27/2012 12:59 PM    STOP taking these medications         ciprofloxacin 500 MG tablet   Commonly known as: CIPRO      TAKE these medications         alendronate 70 MG tablet   Commonly known as: FOSAMAX   Take 70 mg by mouth every 7 (seven) days. Take with a full glass of water on an empty stomach. Saturday      aspirin 81 MG tablet   Take 81 mg  by mouth every morning.      buPROPion 150 MG 12 hr tablet   Commonly known as: WELLBUTRIN SR   Take 150 mg by mouth daily.      CALTRATE 600+D PO   Take 1 tablet by mouth every morning.      diltiazem 360 MG 24 hr capsule   Commonly known as: TIAZAC   Take 360 mg by mouth every morning.      etanercept 50 MG/ML injection   Commonly known as: ENBREL   Inject 50 mg into the skin once a week. TUESDAYS      hyoscyamine 0.125 MG tablet   Commonly known as: LEVSIN, ANASPAZ   Take 0.125 mg by mouth every 4 (four) hours as needed. Stomach cramps      Lysine 500 MG Tabs   Take 1 tablet by mouth 2 (two) times daily.      metroNIDAZOLE 500 MG tablet   Commonly known as: FLAGYL   Take 1 tablet (500 mg total) by mouth every 12 (twelve) hours.      potassium chloride SA 20 MEQ tablet   Commonly known as: K-DUR,KLOR-CON   Take 1 tablet (20 mEq total) by mouth daily.      PREDNISONE PO   Take 2.5 mg by mouth every morning.      PRENATAL VITAMINS PO   Take 1 tablet by mouth daily.      saccharomyces boulardii 250 MG capsule   Commonly known as: FLORASTOR   Take 1 capsule (250 mg total) by mouth 2 (two) times daily.      Vitamin D3 1000 UNITS Caps   Take 1 capsule by mouth daily.        Follow-up Information    Follow up with [facility], [facility]. In 1 week. (for BMP)    Contact information:   [address] [place] [address] [phone]       Follow up with HAYES,JOHN C, MD. In 2 months.   Contact information:   [address]                         [place] [address] [phone]           The results of significant diagnostics from this hospitalization (including imaging, microbiology, ancillary and laboratory) are listed below for reference.    Significant Diagnostic Studies: No results found.  Microbiology: Recent Results (from the past 240 hour(s))  URINE CULTURE     Status: Normal   Collection Time     09/22/12 11:00 AM  Component Value Range Status Comment   Specimen Description URINE, CLEAN CATCH   Final    Special Requests NONE   Final    Culture  Setup Time 09/22/2012 15:04   Final    Colony Count NO GROWTH   Final    Culture NO GROWTH   Final    Report Status 09/23/2012 FINAL   Final   CLOSTRIDIUM DIFFICILE BY PCR     Status: Normal   Collection Time   09/22/12  1:57 PM      Component Value Range Status Comment   C difficile by pcr NEGATIVE  NEGATIVE Final   STOOL CULTURE     Status: Normal   Collection Time   09/22/12  1:57 PM      Component Value Range Status Comment   Specimen Description STOOL   Final    Special Requests NONE   Final    Culture     Final    Value: NO SALMONELLA, SHIGELLA, CAMPYLOBACTER, YERSINIA, OR E.COLI 0157:H7 ISOLATED     Note: REDUCED NORMAL FLORA PRESENT   Report Status 09/26/2012 FINAL   Final      Labs: Basic Metabolic Panel:  Lab 09/27/12 2956 09/26/12 0833 09/25/12 0513 09/24/12 0420 09/23/12 0430 09/22/12 2107  NA 131* 129* 131* 135 132* --  K 4.2 3.9 3.1* 3.9 3.3* --  CL 101 104 105 109 103 --  CO2 21 18* 19 18* 20 --  GLUCOSE 123* 86 120* 127* 91 --  BUN 7 6 5* 5* 10 --  CREATININE 0.73 0.71 0.85 0.91 1.22* --  CALCIUM 8.6 7.7* 7.4* 7.3* 8.4 --  MG -- -- 1.5 -- -- 1.7  PHOS -- -- -- -- -- --   Liver Function Tests:  Lab 09/22/12 1030  AST 27  ALT 20  ALKPHOS 48  BILITOT 0.4  PROT 7.6  ALBUMIN 3.8    Lab 09/22/12 1030  LIPASE 20  AMYLASE --   No results found for this basename: AMMONIA:5 in the last 168 hours CBC:  Lab 09/25/12 0513 09/24/12 0420 09/23/12 0430 09/22/12 1030  WBC 6.1 4.3 4.7 4.3  NEUTROABS -- -- -- 2.4  HGB 10.4* 9.5* 10.0* 12.2  HCT 28.9* 26.6* 27.7* 33.5*  MCV 100.7* 100.4* 99.6 98.5  PLT 188 176 181 211   Cardiac Enzymes: No results found for this basename: CKTOTAL:5,CKMB:5,CKMBINDEX:5,TROPONINI:5 in the last 168 hours BNP: BNP (last 3 results)  Basename 06/22/12 1836  PROBNP 388.1*    CBG: No results found for this basename: GLUCAP:5 in the last 168 hours     Signed:  Benjamine Mola, Marny Smethers  [facility] 09/27/2012, 12:59 PM

## 2012-09-27 NOTE — Progress Notes (Signed)
Patient ID: Regina Ortiz, female   DOB: 1945/12/19, 67 y.o.   MRN: [id number] [facility] Gastroenterology Progress Note  Regina Ortiz 67 y.o. [birth year]   Subjective: Reports several loose stools described as semi-formed with small amount of liquid at onset. Tolerating diet.  Objective: Vital signs in last 24 hours: Filed Vitals:   09/27/12 0602  BP: 142/66  Pulse: 71  Temp: 97.3 F (36.3 C)  Resp: 20    Physical Exam: Gen: alert, no acute distress Abd: soft, nontender, nondistended  Lab Results:  Basename 09/26/12 0833 09/25/12 0513  NA 129* 131*  K 3.9 3.1*  CL 104 105  CO2 18* 19  GLUCOSE 86 120*  BUN 6 5*  CREATININE 0.71 0.85  CALCIUM 7.7* 7.4*  MG -- 1.5  PHOS -- --   No results found for this basename: AST:2,ALT:2,ALKPHOS:2,BILITOT:2,PROT:2,ALBUMIN:2 in the last 72 hours  Basename 09/25/12 0513  WBC 6.1  NEUTROABS --  HGB 10.4*  HCT 28.9*  MCV 100.7*  PLT 188   No results found for this basename: LABPROT:2,INR:2 in the last 72 hours    Assessment/Plan: Resolving diarrhea - ?infection vs. Functional. Outpt colonoscopy in future. Ok to go home this afternoon if doing ok. Needs to complete 10 days of Flagyl. Daily probiotic. F/U with Dr. Madilyn Fireman. Will f/u on stool studies as outpt. D/W Dr. Benjamine Mola.   Dewey Viens C. 09/27/2012, 11:15 AM

## 2012-10-07 ENCOUNTER — Ambulatory Visit: Payer: Medicare Other | Attending: Family Medicine | Admitting: Physical Therapy

## 2012-10-07 DIAGNOSIS — IMO0001 Reserved for inherently not codable concepts without codable children: Secondary | ICD-10-CM | POA: Insufficient documentation

## 2012-10-07 DIAGNOSIS — R269 Unspecified abnormalities of gait and mobility: Secondary | ICD-10-CM | POA: Insufficient documentation

## 2012-10-07 DIAGNOSIS — M6281 Muscle weakness (generalized): Secondary | ICD-10-CM | POA: Insufficient documentation

## 2012-10-07 DIAGNOSIS — R5381 Other malaise: Secondary | ICD-10-CM | POA: Insufficient documentation

## 2012-10-11 ENCOUNTER — Ambulatory Visit: Payer: Medicare Other | Admitting: Physical Therapy

## 2012-10-13 ENCOUNTER — Ambulatory Visit: Payer: Medicare Other | Admitting: Physical Therapy

## 2012-10-18 ENCOUNTER — Ambulatory Visit: Payer: Medicare Other | Admitting: Physical Therapy

## 2012-10-21 ENCOUNTER — Ambulatory Visit: Payer: Medicare Other | Admitting: Physical Therapy

## 2012-10-25 ENCOUNTER — Ambulatory Visit: Payer: Medicare Other | Admitting: Physical Therapy

## 2012-10-28 ENCOUNTER — Ambulatory Visit: Payer: Medicare Other | Admitting: Physical Therapy

## 2012-11-01 ENCOUNTER — Ambulatory Visit: Payer: Medicare Other | Admitting: Physical Therapy

## 2012-11-04 ENCOUNTER — Ambulatory Visit: Payer: Medicare Other | Attending: Family Medicine | Admitting: Physical Therapy

## 2012-11-04 DIAGNOSIS — R5381 Other malaise: Secondary | ICD-10-CM | POA: Insufficient documentation

## 2012-11-04 DIAGNOSIS — IMO0001 Reserved for inherently not codable concepts without codable children: Secondary | ICD-10-CM | POA: Insufficient documentation

## 2012-11-09 ENCOUNTER — Encounter: Payer: Medicare Other | Admitting: Physical Therapy

## 2012-11-09 ENCOUNTER — Other Ambulatory Visit: Payer: Self-pay

## 2012-11-09 DIAGNOSIS — Z1231 Encounter for screening mammogram for malignant neoplasm of breast: Secondary | ICD-10-CM

## 2012-11-11 ENCOUNTER — Encounter: Payer: Medicare Other | Admitting: Physical Therapy

## 2012-11-17 ENCOUNTER — Ambulatory Visit: Payer: Medicare Other

## 2012-11-22 ENCOUNTER — Encounter: Payer: Medicare Other | Admitting: Cardiovascular Disease

## 2012-12-01 ENCOUNTER — Ambulatory Visit: Payer: Medicare Other | Attending: Family Medicine | Admitting: Physical Therapy

## 2012-12-01 DIAGNOSIS — R5381 Other malaise: Secondary | ICD-10-CM | POA: Insufficient documentation

## 2012-12-01 DIAGNOSIS — IMO0001 Reserved for inherently not codable concepts without codable children: Secondary | ICD-10-CM | POA: Insufficient documentation

## 2012-12-13 ENCOUNTER — Ambulatory Visit: Payer: Medicare Other

## 2012-12-15 ENCOUNTER — Ambulatory Visit: Payer: Medicare Other | Admitting: Physical Therapy

## 2012-12-17 ENCOUNTER — Emergency Department (HOSPITAL_COMMUNITY)
Admission: EM | Admit: 2012-12-17 | Discharge: 2012-12-17 | Disposition: A | Discharge status: Home / Self Care | Payer: Medicare Other | Attending: Emergency Medicine | Admitting: Emergency Medicine

## 2012-12-17 ENCOUNTER — Encounter (HOSPITAL_COMMUNITY): Payer: Self-pay | Admitting: Emergency Medicine

## 2012-12-17 DIAGNOSIS — I10 Essential (primary) hypertension: Secondary | ICD-10-CM | POA: Insufficient documentation

## 2012-12-17 DIAGNOSIS — F3289 Other specified depressive episodes: Secondary | ICD-10-CM | POA: Insufficient documentation

## 2012-12-17 DIAGNOSIS — J4489 Other specified chronic obstructive pulmonary disease: Secondary | ICD-10-CM | POA: Insufficient documentation

## 2012-12-17 DIAGNOSIS — Z87448 Personal history of other diseases of urinary system: Secondary | ICD-10-CM | POA: Insufficient documentation

## 2012-12-17 DIAGNOSIS — F17200 Nicotine dependence, unspecified, uncomplicated: Secondary | ICD-10-CM | POA: Insufficient documentation

## 2012-12-17 DIAGNOSIS — Z87798 Personal history of other (corrected) congenital malformations: Secondary | ICD-10-CM | POA: Insufficient documentation

## 2012-12-17 DIAGNOSIS — M129 Arthropathy, unspecified: Secondary | ICD-10-CM | POA: Insufficient documentation

## 2012-12-17 DIAGNOSIS — Z79899 Other long term (current) drug therapy: Secondary | ICD-10-CM | POA: Insufficient documentation

## 2012-12-17 DIAGNOSIS — Z7982 Long term (current) use of aspirin: Secondary | ICD-10-CM | POA: Insufficient documentation

## 2012-12-17 DIAGNOSIS — F411 Generalized anxiety disorder: Secondary | ICD-10-CM | POA: Insufficient documentation

## 2012-12-17 DIAGNOSIS — F329 Major depressive disorder, single episode, unspecified: Secondary | ICD-10-CM | POA: Insufficient documentation

## 2012-12-17 DIAGNOSIS — Z8679 Personal history of other diseases of the circulatory system: Secondary | ICD-10-CM | POA: Insufficient documentation

## 2012-12-17 DIAGNOSIS — M810 Age-related osteoporosis without current pathological fracture: Secondary | ICD-10-CM | POA: Insufficient documentation

## 2012-12-17 DIAGNOSIS — Z8709 Personal history of other diseases of the respiratory system: Secondary | ICD-10-CM | POA: Insufficient documentation

## 2012-12-17 DIAGNOSIS — Z7983 Long term (current) use of bisphosphonates: Secondary | ICD-10-CM | POA: Insufficient documentation

## 2012-12-17 DIAGNOSIS — IMO0002 Reserved for concepts with insufficient information to code with codable children: Secondary | ICD-10-CM | POA: Insufficient documentation

## 2012-12-17 DIAGNOSIS — J449 Chronic obstructive pulmonary disease, unspecified: Secondary | ICD-10-CM | POA: Insufficient documentation

## 2012-12-17 DIAGNOSIS — M069 Rheumatoid arthritis, unspecified: Secondary | ICD-10-CM | POA: Insufficient documentation

## 2012-12-17 DIAGNOSIS — Z8719 Personal history of other diseases of the digestive system: Secondary | ICD-10-CM | POA: Insufficient documentation

## 2012-12-17 MED ORDER — ACETAMINOPHEN 325 MG PO TABS
650.0000 mg | ORAL_TABLET | Freq: Once | ORAL | Status: AC
  Administered 2012-12-17: 650 mg via ORAL
  Filled 2012-12-17: qty 2

## 2012-12-17 NOTE — ED Provider Notes (Signed)
History     CSN: [id number]  Arrival date & time 12/17/12  1255   First MD Initiated Contact with Patient 12/17/12 1339      Chief Complaint  Patient presents with  . Hypertension    (Consider location/radiation/quality/duration/timing/severity/associated sxs/prior treatment) Patient is a 67 y.o. female presenting with hypertension. The history is provided by the patient.  Hypertension   patient here complaining of increased blood pressure home x2 days. Recently treated for sinus infection with prednisone. Saw her Dr. yesterday and was told to restart her Diovan if her blood pressure was elevated today. At home her systolic was above 200. She denied any associated headache, chest pain, shortness of breath, abdominal pain, syncope or near-syncope. Presents here due 2 concern for increased blood pressure.  Past Medical History  Diagnosis Date  . Dysplasia     Multifocal genital, VIN and VAIN  . Arthritis   . Hypertension   . Osteoporosis   . History of anal fissures   . RA (rheumatoid arthritis)   . Bruises easily   . Nocturia   . Smokers' cough   . Depression   . Anxiety   . COPD (chronic obstructive pulmonary disease)   . Short of breath on exertion   . LBBB (left bundle branch block) W/ PROLONGED PR    CARDIOLOGSIT-  DR Eden Emms-   LOV IN EPIC    Past Surgical History  Procedure Laterality Date  . Wide local excision of labia majora  04-04-2010    Right-sided lesion, CO2 ablation of right labia minora  . Excision vulvar lesions    . Appendectomy  1992  . Vaginal hysterectomy  1997  . Repair fistula in ano/  i & d perirectal abscess  10-26-2002  DR Luretha Murphy  . Cardiovascular stress test  06-23-2012  DR [name]    NORMAL LEXISCAN MYOVIEW/ EF 83%  . Transthoracic echocardiogram  06-23-2012    NORMAL LVSF/ EF 55-60%  . Co2 laser application  07/22/2012    Procedure: CO2 LASER APPLICATION;  Surgeon: Laurette Schimke, MD PHD;  Location: [facility];  Service: Gynecology;  Laterality: N/A;  Laser Vaporization    Family History  Problem Relation Age of Onset  . Diabetes Mother   . Hypertension Father   . Lymphoma Son     History  Substance Use Topics  . Smoking status: Current Every Day Smoker -- 0.50 packs/day for 50 years    Types: Cigarettes  . Smokeless tobacco: Never Used  . Alcohol Use: 7.2 oz/week    12 Cans of beer per week     Comment: 12-15 beers a week    OB History   Grav Para Term Preterm Abortions TAB SAB Ect Mult Living                  Review of Systems  All other systems reviewed and are negative.    Allergies  Latex; Lactose intolerance (gi); Prinivil; Triamcinolone acetonide; Amlodipine; and Lotensin  Home Medications   Current Outpatient Rx  Name  Route  Sig  Dispense  Refill  . alendronate (FOSAMAX) 70 MG tablet   Oral   Take 70 mg by mouth every 7 (seven) days. Take with a full glass of water on an empty stomach. Saturday         . aspirin 81 MG tablet   Oral   Take 81 mg by mouth every morning.          Marland Kitchen  B Complex-C (B-COMPLEX WITH VITAMIN C) tablet   Oral   Take 1 tablet by mouth every morning.         Marland Kitchen buPROPion (WELLBUTRIN SR) 150 MG 12 hr tablet   Oral   Take 150 mg by mouth every morning.          . Calcium Carbonate-Vitamin D (CALTRATE 600+D PO)   Oral   Take 1 tablet by mouth every morning.          . Cholecalciferol (VITAMIN D3) 1000 UNITS CAPS   Oral   Take 1 capsule by mouth every morning.          . cycloSPORINE (RESTASIS) 0.05 % ophthalmic emulsion   Both Eyes   Place 1 drop into both eyes 2 (two) times daily.         Marland Kitchen dextromethorphan-guaiFENesin (MUCINEX DM) 30-600 MG per 12 hr tablet   Oral   Take 1 tablet by mouth every 12 (twelve) hours.         Marland Kitchen diltiazem (TIAZAC) 360 MG 24 hr capsule   Oral   Take 360 mg by mouth every morning.         Marland Kitchen doxycycline (VIBRA-TABS) 100 MG tablet   Oral   Take 100 mg by mouth 2 (two)  times daily.         Marland Kitchen etanercept (ENBREL) 50 MG/ML injection   Subcutaneous   Inject 50 mg into the skin once a week. TUESDAYS         . Lysine 500 MG TABS   Oral   Take 1 tablet by mouth 2 (two) times daily.         . predniSONE (DELTASONE) 5 MG tablet   Oral   Take 5 mg by mouth every morning.         . predniSONE (STERAPRED UNI-PAK) 10 MG tablet   Oral   Take 10 mg by mouth every morning.         Marland Kitchen PRENATAL VITAMINS PO   Oral   Take 1 tablet by mouth every morning.          . valsartan (DIOVAN) 160 MG tablet   Oral   Take 160 mg by mouth every morning.           BP 188/79  Pulse 86  Temp(Src) 98.1 F (36.7 C) (Oral)  Resp 16  SpO2 96%  Physical Exam  Nursing note and vitals reviewed. Constitutional: She is oriented to person, place, and time. She appears well-developed and well-nourished.  Non-toxic appearance. No distress.  HENT:  Head: Normocephalic and atraumatic.  Eyes: Conjunctivae, EOM and lids are normal. Pupils are equal, round, and reactive to light.  Neck: Normal range of motion. Neck supple. No tracheal deviation present. No mass present.  Cardiovascular: Normal rate, regular rhythm and normal heart sounds.  Exam reveals no gallop.   No murmur heard. Pulmonary/Chest: Effort normal and breath sounds normal. No stridor. No respiratory distress. She has no decreased breath sounds. She has no wheezes. She has no rhonchi. She has no rales.  Abdominal: Soft. Normal appearance and bowel sounds are normal. She exhibits no distension. There is no tenderness. There is no rebound and no CVA tenderness.  Musculoskeletal: Normal range of motion. She exhibits no edema and no tenderness.  Neurological: She is alert and oriented to person, place, and time. She has normal strength. No cranial nerve deficit or sensory deficit. GCS eye subscore is 4. GCS verbal subscore is  5. GCS motor subscore is 6.  Skin: Skin is warm and dry. No abrasion and no rash noted.   Psychiatric: She has a normal mood and affect. Her speech is normal and behavior is normal.    ED Course  Procedures (including critical care time)  Labs Reviewed - No data to display No results found.   No diagnosis found.    MDM  Patient blood pressure was also noted here. Patient remains hypotensive but asymptomatic. She will continue taking her Diovan. Was explained to her that the prednisone will also likely increase her blood pressure. She also admitted to taking over-the-counter medication with pseudoephedrine and will discontinue this as well. No concern for hypertensive crisis at this time. Discharge blood pressure is 180/79. She will follow with her Dr. next week        Toy Baker, MD 12/17/12 [phone]

## 2012-12-17 NOTE — ED Notes (Signed)
Pt sts Hx of HTN.  Sts, in January, she was taken off one of her HTN medications, but started back today, per PCP.  Also, c/o headache.  Pain score 3/10.

## 2012-12-17 NOTE — ED Notes (Signed)
MD at bedside. 

## 2012-12-17 NOTE — ED Notes (Signed)
Pt complains of elevated blood pressure x 2 days

## 2012-12-17 NOTE — ED Notes (Signed)
Pt escorted to discharge window. Verbalized understanding discharge instructions. In no acute distress.   

## 2012-12-23 ENCOUNTER — Ambulatory Visit: Payer: Medicare Other | Admitting: Physical Therapy

## 2013-03-02 ENCOUNTER — Telehealth: Payer: Self-pay | Admitting: Gynecologic Oncology

## 2013-03-02 NOTE — Telephone Encounter (Signed)
Office Visit Note: Gynecoloic -Oncology  CC: VIN surveillance  Assessment/Plan:  This is a 67 y.o. with recurrent multifocal vulvar dysplasia. S/P  CO2 laser ablation of the external genitalia on 06/22/2012.  F/U in 6 months Advised to stop smoking.    HPI:  HISTORY OF PRESENT ILLNESS: This is a 67 year old with multifocal lower genital tract dysplasia, both VIN and VAIN. History is notable for  hysterectomy for carcinoma in situ in 1997. She subsequently underwent  upper vaginal colpectomies in 1983 and 2000 for vaginal dysplasia. In  June 2011, a vulva biopsy demonstrated VIN III. In August 2011, she  underwent wide local excision of a right-sided inferior labia majora  lesion and CO2 ablation of the right labia minora. She denies any  vulvar pruritus or discharge.   Pap 2010, 2011, 2012  2013 wnl.  Patient seen 01/2012.  Bx c/w VIN III.  Patient given trial of Aldara.  She stated that it has been difficult to apply Aldara to the external genitalia at the site of the lesions and requested CO2 laser treatment.  She underwent Co2 laser ablation of the external genitalia on 06/22/2012.   REVIEW OF SYSTEMS: No vulva pruritus, adenopathy, cough, shortness of  breath; vulva or vaginal bleeding. No chest pain, nausea, vomiting, diarrhea, constipation,.  No urinary incontinence, vaginal bleeding or discharge.  Review of other systems non contributory.   Past Surgical Hx:  Past Surgical History  Procedure Laterality Date  . Wide local excision of labia majora  04-04-2010    Right-sided lesion, CO2 ablation of right labia minora  . Excision vulvar lesions    . Appendectomy  1992  . Vaginal hysterectomy  1997  . Repair fistula in ano/  i & d perirectal abscess  10-26-2002  DR Luretha Murphy  . Cardiovascular stress test  06-23-2012  DR [name]    NORMAL LEXISCAN MYOVIEW/ EF 83%  . Transthoracic echocardiogram  06-23-2012    NORMAL LVSF/ EF 55-60%  . Co2 laser application  07/22/2012    Procedure: CO2 LASER APPLICATION;  Surgeon: Laurette Schimke, MD PHD;  Location: [facility];  Service: Gynecology;  Laterality: N/A;  Laser Vaporization    Past Medical Hx:  Past Medical History  Diagnosis Date  . Dysplasia     Multifocal genital, VIN and VAIN  . Arthritis   . Hypertension   . Osteoporosis   . History of anal fissures   . RA (rheumatoid arthritis)   . Bruises easily   . Nocturia   . Smokers' cough   . Depression   . Anxiety   . COPD (chronic obstructive pulmonary disease)   . Short of breath on exertion   . LBBB (left bundle branch block) W/ PROLONGED PR    CARDIOLOGSIT-  DR Eden Emms-   LOV IN EPIC    Family Hx:  Family History  Problem Relation Age of Onset  . Diabetes Mother   . Hypertension Father   . Lymphoma Son      Vitals:  Blood pressure 108/60, pulse 74, temperature 97.6 F (36.4 C), temperature source Oral, resp. rate 16, height 5' 3.58" (1.615 m), weight 93 lb 1.6 oz (42.23 kg).  Physical Exam: WD thin female in NAD BP 108/60  Pulse 74  Temp 97.6 F (36.4 C) (Oral)  Resp 16  Ht 5' 3.58" (1.615 m)  Wt 93 lb 1.6 oz (42.23 kg)  BMI 16.19 kg/m2 PELVIC: External genitalia with extensive scaring.  Has healed well. EXT:  No  CCE    Laurette Schimke, MD., PhD. 03/02/2013, 9:46 PM   .

## 2013-03-03 ENCOUNTER — Ambulatory Visit: Payer: Medicare Other | Attending: Gynecologic Oncology | Admitting: Gynecologic Oncology

## 2013-03-03 ENCOUNTER — Other Ambulatory Visit (HOSPITAL_COMMUNITY)
Admission: RE | Admit: 2013-03-03 | Discharge: 2013-03-03 | Disposition: A | Discharge status: Home / Self Care | Payer: Medicare Other | Source: Ambulatory Visit | Attending: Gynecologic Oncology | Admitting: Gynecologic Oncology

## 2013-03-03 ENCOUNTER — Encounter: Payer: Self-pay | Admitting: Gynecologic Oncology

## 2013-03-03 ENCOUNTER — Other Ambulatory Visit: Payer: Self-pay | Admitting: Gynecologic Oncology

## 2013-03-03 VITALS — BP 128/70 | HR 72 | Temp 98.7°F | Resp 16 | Ht 63.58 in | Wt 99.9 lb

## 2013-03-03 DIAGNOSIS — K644 Residual hemorrhoidal skin tags: Secondary | ICD-10-CM | POA: Insufficient documentation

## 2013-03-03 DIAGNOSIS — M810 Age-related osteoporosis without current pathological fracture: Secondary | ICD-10-CM | POA: Insufficient documentation

## 2013-03-03 DIAGNOSIS — J4489 Other specified chronic obstructive pulmonary disease: Secondary | ICD-10-CM | POA: Insufficient documentation

## 2013-03-03 DIAGNOSIS — F17200 Nicotine dependence, unspecified, uncomplicated: Secondary | ICD-10-CM | POA: Insufficient documentation

## 2013-03-03 DIAGNOSIS — N893 Dysplasia of vagina, unspecified: Secondary | ICD-10-CM | POA: Insufficient documentation

## 2013-03-03 DIAGNOSIS — I10 Essential (primary) hypertension: Secondary | ICD-10-CM | POA: Insufficient documentation

## 2013-03-03 DIAGNOSIS — Z90710 Acquired absence of both cervix and uterus: Secondary | ICD-10-CM | POA: Insufficient documentation

## 2013-03-03 DIAGNOSIS — D071 Carcinoma in situ of vulva: Secondary | ICD-10-CM

## 2013-03-03 DIAGNOSIS — K6282 Dysplasia of anus: Secondary | ICD-10-CM

## 2013-03-03 DIAGNOSIS — J449 Chronic obstructive pulmonary disease, unspecified: Secondary | ICD-10-CM | POA: Insufficient documentation

## 2013-03-03 DIAGNOSIS — N9089 Other specified noninflammatory disorders of vulva and perineum: Secondary | ICD-10-CM | POA: Insufficient documentation

## 2013-03-03 DIAGNOSIS — Z124 Encounter for screening for malignant neoplasm of cervix: Secondary | ICD-10-CM | POA: Insufficient documentation

## 2013-03-03 NOTE — Patient Instructions (Signed)
F/U with Dr. Teresa Coombs for management of the anal lesions  F/U with Gyn on in 6 months  Will call with pap results.    Thank you very much Ms. Regina Ortiz for allowing me to provide care for you today.  I appreciate your confidence in choosing our Gynecologic Oncology team.  If you have any questions about your visit today please call our office and we will get back to you as soon as possible.  [name]. Karman Veney MD., PhD Gynecologic Oncology

## 2013-03-03 NOTE — Progress Notes (Signed)
Office Visit Note: Gynecoloic -Oncology  CC: VIN surveillance  Assessment/Plan:  This is a 67 y.o. with recurrent multifocal vulvar dysplasia. S/P  CO2 laser ablation of the external genitalia on 06/22/2012.  F/U in 6 months Advised to stop smoking.  AIN?:  Lesion noted on external hemorrhoid. Will refer to Dr. Abbey Chatters for evaluation and management  HPI:  HISTORY OF PRESENT ILLNESS: This is a 67 year old with multifocal lower genital tract dysplasia, both VIN and VAIN. History is notable for hysterectomy for carcinoma in situ in 1997. She subsequently underwent  upper vaginal colpectomies in 1983 and 2000 for vaginal dysplasia. In June 2011, a vulva biopsy demonstrated VIN III. In August 2011, she  underwent wide local excision of a right-sided inferior labia majora lesion and CO2 ablation of the right labia minora. She denies any  vulvar pruritus or discharge.   Pap 2010, 2011, 2012  2013 wnl.  Patient seen 01/2012.  Bx c/w VIN III.  Patient given trial of Aldara.  She stated that it has been difficult to apply Aldara to the external genitalia at the site of the lesions and requested CO2 laser treatment.  She underwent Co2 laser ablation of the external genitalia on 06/22/2012.  Patient reports hospitalization for diarrhea in January. Etiology of the diarrhea was never determined.   REVIEW OF SYSTEMS: No vulva pruritus, adenopathy, cough, shortness of  breath; vulva or vaginal bleeding. No chest pain, nausea, vomiting, diarrhea, constipation,.  No urinary incontinence, vaginal bleeding or discharge.  Review of other systems non contributory.   Past Surgical Hx:  Past Surgical History  Procedure Laterality Date  . Wide local excision of labia majora  04-04-2010    Right-sided lesion, CO2 ablation of right labia minora  . Excision vulvar lesions    . Appendectomy  1992  . Vaginal hysterectomy  1997  . Repair fistula in ano/  i & d perirectal abscess  10-26-2002  DR Luretha Murphy  .  Cardiovascular stress test  06-23-2012  DR [name]    NORMAL LEXISCAN MYOVIEW/ EF 83%  . Transthoracic echocardiogram  06-23-2012    NORMAL LVSF/ EF 55-60%  . Co2 laser application  07/22/2012    Procedure: CO2 LASER APPLICATION;  Surgeon: Laurette Schimke, MD PHD;  Location: [facility];  Service: Gynecology;  Laterality: N/A;  Laser Vaporization    Past Medical Hx:  Past Medical History  Diagnosis Date  . Dysplasia     Multifocal genital, VIN and VAIN  . Arthritis   . Hypertension   . Osteoporosis   . History of anal fissures   . RA (rheumatoid arthritis)   . Bruises easily   . Nocturia   . Smokers' cough   . Depression   . Anxiety   . COPD (chronic obstructive pulmonary disease)   . Short of breath on exertion   . LBBB (left bundle branch block) W/ PROLONGED PR    CARDIOLOGSIT-  DR Eden Emms-   LOV IN EPIC    Family Hx:  Family History  Problem Relation Age of Onset  . Diabetes Mother   . Hypertension Father   . Lymphoma Son      Vitals:  Blood pressure 128/70, pulse 72, temperature 98.7 F (37.1 C), temperature source Oral, resp. rate 16, height 5' 3.58" (1.615 m), weight 99 lb 14.4 oz (45.314 kg).  Physical Exam: WD thin female in NAD BP 128/70  Pulse 72  Temp(Src) 98.7 F (37.1 C) (Oral)  Resp 16  Ht 5'  3.58" (1.615 m)  Wt 99 lb 14.4 oz (45.314 kg)  BMI 17.37 kg/m2 LN:  No cervical supraclavicular or inguinal adenopathy PELVIC: External genitalia with extensive scaring.  Has healed well. Acetic acid applied without any acetowhite changes.  No pelvic masses or nodularity RECTAL:  1 cm acetowhite lesion noted on external hemorrhoid at 10:00 EXT:  No CCE    Laurette Schimke, MD., PhD. 03/03/2013, 2:40 PM   .

## 2013-03-09 ENCOUNTER — Telehealth: Payer: Self-pay | Admitting: Gynecologic Oncology

## 2013-03-09 NOTE — Telephone Encounter (Signed)
Attempted to call patient with pap smear results.  Will retry at a later time.

## 2013-03-11 ENCOUNTER — Telehealth: Payer: Self-pay | Admitting: Gynecologic Oncology

## 2013-03-11 NOTE — Telephone Encounter (Signed)
Pt notified about pap results: negative and upcoming appt with Dr. Abbey Chatters.  No questions or concerns voiced.

## 2013-04-05 ENCOUNTER — Ambulatory Visit (INDEPENDENT_AMBULATORY_CARE_PROVIDER_SITE_OTHER): Payer: Medicare Other | Admitting: General Surgery

## 2013-04-05 ENCOUNTER — Encounter (INDEPENDENT_AMBULATORY_CARE_PROVIDER_SITE_OTHER): Payer: Self-pay | Admitting: General Surgery

## 2013-04-05 VITALS — BP 120/64 | HR 96 | Temp 98.6°F | Resp 14 | Ht 63.5 in | Wt 98.6 lb

## 2013-04-05 DIAGNOSIS — K6282 Dysplasia of anus: Secondary | ICD-10-CM | POA: Insufficient documentation

## 2013-04-05 NOTE — Progress Notes (Addendum)
Patient ID: Regina Ortiz, female   DOB: [birth year], 67 y.o.   MRN: [id number]  Chief Complaint  Patient presents with  . New Evaluation    eval lesion on ext hems    HPI CAIDENCE Regina Ortiz is a 67 y.o. female.   HPI  She is referred by Dr. Laurette Schimke for further evaluation of possible AIN on an external hemorrhoid at the 10:00 position. She has had VIN in the past and has had treatment for this ,by Dr. Nelly Rout, surgically. She tried Aldara cream but was not able to put this well.  Dr. Nelly Rout recently examined her and placed acetic acid in the angle and fullbore areas. There was acetowhite staining on the external hemorrhoid at the 10:00 position. She has no itching or anal bleeding.  Past Medical History  Diagnosis Date  . Dysplasia     Multifocal genital, VIN and VAIN  . Arthritis   . Hypertension   . Osteoporosis   . History of anal fissures   . RA (rheumatoid arthritis)   . Bruises easily   . Nocturia   . Smokers' cough   . Depression   . Anxiety   . COPD (chronic obstructive pulmonary disease)   . Short of breath on exertion   . LBBB (left bundle branch block) W/ PROLONGED PR    CARDIOLOGSIT-  DR Eden Emms-   LOV IN EPIC    Past Surgical History  Procedure Laterality Date  . Wide local excision of labia majora  04-04-2010    Right-sided lesion, CO2 ablation of right labia minora  . Excision vulvar lesions    . Appendectomy  1992  . Vaginal hysterectomy  1997  . Repair fistula in ano/  i & d perirectal abscess  10-26-2002  DR Luretha Murphy  . Cardiovascular stress test  06-23-2012  DR [name]    NORMAL LEXISCAN MYOVIEW/ EF 83%  . Transthoracic echocardiogram  06-23-2012    NORMAL LVSF/ EF 55-60%  . Co2 laser application  07/22/2012    Procedure: CO2 LASER APPLICATION;  Surgeon: Laurette Schimke, MD PHD;  Location: [facility];  Service: Gynecology;  Laterality: N/A;  Laser Vaporization    Family History  Problem Relation Age of Onset  . Diabetes  Mother   . Hypertension Father   . Lymphoma Son   . Cancer Son     lymphoblastic lymphoma    Social History History  Substance Use Topics  . Smoking status: Current Every Day Smoker -- 0.50 packs/day for 50 years    Types: Cigarettes  . Smokeless tobacco: Never Used  . Alcohol Use: 7.2 oz/week    12 Cans of beer per week     Comment: 12-15 beers a week    Allergies  Allergen Reactions  . Latex Swelling and Other (See Comments)     fever blisters  . Lactose Intolerance (Gi)   . Prinivil (Lisinopril) Diarrhea  . Triamcinolone Acetonide Other (See Comments)    REDNESS AND PAIN  . Amlodipine Rash  . Lotensin (Benazepril Hcl) Rash    Current Outpatient Prescriptions  Medication Sig Dispense Refill  . alendronate (FOSAMAX) 70 MG tablet Take 70 mg by mouth every 7 (seven) days. Take with a full glass of water on an empty stomach. Saturday      . aspirin 81 MG tablet Take 81 mg by mouth every morning.       . B Complex-C (B-COMPLEX WITH VITAMIN C) tablet Take 1 tablet by mouth  every morning.      Marland Kitchen buPROPion (WELLBUTRIN SR) 150 MG 12 hr tablet Take 150 mg by mouth every morning.       . busPIRone (BUSPAR) 15 MG tablet Take 15 mg by mouth 2 (two) times daily.      . Calcium Carbonate-Vitamin D (CALTRATE 600+D PO) Take 1 tablet by mouth every morning.       . Cholecalciferol (VITAMIN D3) 1000 UNITS CAPS Take 1 capsule by mouth every morning.       . cycloSPORINE (RESTASIS) 0.05 % ophthalmic emulsion Place 1 drop into both eyes 2 (two) times daily.      . cycloSPORINE (RESTASIS) 0.05 % ophthalmic emulsion Place 1 drop into both eyes 2 (two) times daily.      Marland Kitchen diltiazem (TIAZAC) 360 MG 24 hr capsule Take 360 mg by mouth every morning.      . etanercept (ENBREL) 50 MG/ML injection Inject 50 mg into the skin once a week. TUESDAYS      . Lysine 500 MG TABS Take 1 tablet by mouth 2 (two) times daily.      . predniSONE (DELTASONE) 5 MG tablet Take 5 mg by mouth every morning.      Marland Kitchen  PRENATAL VITAMINS PO Take 1 tablet by mouth every morning.       . valsartan (DIOVAN) 160 MG tablet Take 160 mg by mouth every morning.       No current facility-administered medications for this visit.    Review of Systems Review of Systems  Constitutional: Positive for unexpected weight change (Following an illness earlier this year.).  Cardiovascular: Positive for leg swelling (Around the ankles.).  Gastrointestinal: Positive for abdominal pain and diarrhea.       Release somewhat by a bowel movement.  Neurological: Positive for headaches.  Hematological: Bruises/bleeds easily.    Blood pressure 120/64, pulse 96, temperature 98.6 F (37 C), temperature source Temporal, resp. rate 14, height 5' 3.5" (1.613 m), weight 98 lb 9.6 oz (44.725 kg).  Physical Exam Physical Exam  Constitutional: She appears well-developed and well-nourished. No distress.  HENT:  Head: Normocephalic and atraumatic.  Cardiovascular: Normal rate and regular rhythm.   Abdominal: Soft. She exhibits no distension and no mass. There is no tenderness.  Genitourinary:  External hemorrhoid at the 10:00 position with no visible irregularity.  Musculoskeletal: She exhibits no edema.  Skin: Skin is warm and dry.    Data Reviewed Dr. Forrestine Him note.  Assessment    History of VIN now with suspicion of a AIN on an external hemorrhoid at the 10:00 position.     Plan    Excision of external hemorrhoid AIN.  I discussed the procedure and risks with her. Risks include but are not limited to bleeding, infection, wound healing problems, anesthesia. She seems to understand all this and agrees with the plan.        Lindley Hiney J 04/05/2013, 5:34 PM

## 2013-04-05 NOTE — Patient Instructions (Signed)
Try some Mylanta for your stomach discomfort. Try to stop smoking as much as possible before the surgery.

## 2013-04-07 ENCOUNTER — Encounter (HOSPITAL_COMMUNITY): Payer: Self-pay | Admitting: Pharmacy Technician

## 2013-04-12 ENCOUNTER — Encounter (HOSPITAL_COMMUNITY): Payer: Self-pay

## 2013-04-12 ENCOUNTER — Encounter (HOSPITAL_COMMUNITY)
Admission: RE | Admit: 2013-04-12 | Discharge: 2013-04-12 | Disposition: A | Discharge status: Home / Self Care | Payer: Medicare Other | Source: Ambulatory Visit | Attending: General Surgery | Admitting: General Surgery

## 2013-04-12 LAB — COMPREHENSIVE METABOLIC PANEL
AST: 26 U/L (ref 0–37)
Albumin: 4 g/dL (ref 3.5–5.2)
Calcium: 10.7 mg/dL — ABNORMAL HIGH (ref 8.4–10.5)
Chloride: 90 mEq/L — ABNORMAL LOW (ref 96–112)
Creatinine, Ser: 0.87 mg/dL (ref 0.50–1.10)
Sodium: 127 mEq/L — ABNORMAL LOW (ref 135–145)
Total Bilirubin: 0.3 mg/dL (ref 0.3–1.2)

## 2013-04-12 LAB — CBC WITH DIFFERENTIAL/PLATELET
Basophils Absolute: 0 10*3/uL (ref 0.0–0.1)
Basophils Relative: 0 % (ref 0–1)
HCT: 35.4 % — ABNORMAL LOW (ref 36.0–46.0)
MCHC: 37 g/dL — ABNORMAL HIGH (ref 30.0–36.0)
Monocytes Absolute: 0.3 10*3/uL (ref 0.1–1.0)
Neutro Abs: 6.1 10*3/uL (ref 1.7–7.7)
Platelets: 204 10*3/uL (ref 150–400)
RDW: 13.2 % (ref 11.5–15.5)

## 2013-04-12 LAB — PROTIME-INR: INR: 0.91 (ref 0.00–1.49)

## 2013-04-12 NOTE — Patient Instructions (Addendum)
20 Regina Ortiz  04/12/2013   Your procedure is scheduled on: 04/15/13  Report to [facility] at 7:30 AM.  Call this number if you have problems the morning of surgery [phone]: [phone]   Remember:   Do not eat food or drink liquids After Midnight.     Take these medicines the morning of surgery with A SIP OF WATER: bupropion, buspirone, diltiazem, prednisone   Do not wear jewelry, make-up or nail polish.  Do not wear lotions, powders, or perfumes. You may wear deodorant.  Do not shave 48 hours prior to surgery. Men may shave face and neck.  Do not bring valuables to the hospital.  Contacts, dentures or bridgework may not be worn into surgery.    Patients discharged the day of surgery will not be allowed to drive home.  Name and phone number of your driver: Regina Ortiz [phone]    Regina Sons, RN  pre op nurse call if needed [phone]    FAILURE TO FOLLOW THESE INSTRUCTIONS MAY RESULT IN CANCELLATION OF YOUR SURGERY   Patient Signature: ___________________________________________

## 2013-04-12 NOTE — Progress Notes (Signed)
Chest x-ray 06/22/12 on EPIC, EKG 09/23/12 on EPIC

## 2013-04-15 ENCOUNTER — Ambulatory Visit (HOSPITAL_COMMUNITY)
Admission: RE | Admit: 2013-04-15 | Discharge: 2013-04-15 | Disposition: A | Discharge status: Home / Self Care | Payer: Medicare Other | Source: Ambulatory Visit | Attending: General Surgery | Admitting: General Surgery

## 2013-04-15 ENCOUNTER — Encounter (HOSPITAL_COMMUNITY): Payer: Self-pay | Admitting: Anesthesiology

## 2013-04-15 ENCOUNTER — Encounter (HOSPITAL_COMMUNITY)
Admission: RE | Disposition: A | Discharge status: Home / Self Care | Payer: Self-pay | Source: Ambulatory Visit | Attending: General Surgery

## 2013-04-15 ENCOUNTER — Ambulatory Visit (HOSPITAL_COMMUNITY): Payer: Medicare Other | Admitting: Anesthesiology

## 2013-04-15 ENCOUNTER — Encounter (HOSPITAL_COMMUNITY): Payer: Self-pay | Admitting: *Deleted

## 2013-04-15 DIAGNOSIS — Z87412 Personal history of vulvar dysplasia: Secondary | ICD-10-CM | POA: Insufficient documentation

## 2013-04-15 DIAGNOSIS — I10 Essential (primary) hypertension: Secondary | ICD-10-CM | POA: Insufficient documentation

## 2013-04-15 DIAGNOSIS — K644 Residual hemorrhoidal skin tags: Secondary | ICD-10-CM

## 2013-04-15 DIAGNOSIS — J4489 Other specified chronic obstructive pulmonary disease: Secondary | ICD-10-CM | POA: Insufficient documentation

## 2013-04-15 DIAGNOSIS — Z7982 Long term (current) use of aspirin: Secondary | ICD-10-CM | POA: Insufficient documentation

## 2013-04-15 DIAGNOSIS — Z01812 Encounter for preprocedural laboratory examination: Secondary | ICD-10-CM | POA: Insufficient documentation

## 2013-04-15 DIAGNOSIS — J449 Chronic obstructive pulmonary disease, unspecified: Secondary | ICD-10-CM | POA: Insufficient documentation

## 2013-04-15 DIAGNOSIS — K648 Other hemorrhoids: Secondary | ICD-10-CM

## 2013-04-15 DIAGNOSIS — M069 Rheumatoid arthritis, unspecified: Secondary | ICD-10-CM | POA: Insufficient documentation

## 2013-04-15 DIAGNOSIS — Z79899 Other long term (current) drug therapy: Secondary | ICD-10-CM | POA: Insufficient documentation

## 2013-04-15 HISTORY — PX: HEMORRHOID SURGERY: SHX153

## 2013-04-15 SURGERY — HEMORRHOIDECTOMY
Anesthesia: General | Wound class: Clean Contaminated

## 2013-04-15 MED ORDER — FENTANYL CITRATE 0.05 MG/ML IJ SOLN
INTRAMUSCULAR | Status: DC | PRN
  Administered 2013-04-15: 50 ug via INTRAVENOUS

## 2013-04-15 MED ORDER — BUPIVACAINE LIPOSOME 1.3 % IJ SUSP
INTRAMUSCULAR | Status: DC | PRN
  Administered 2013-04-15: 20 mL

## 2013-04-15 MED ORDER — SODIUM CHLORIDE 0.9 % IJ SOLN: 3.0000 mL | Freq: Two times a day (BID) | INTRAMUSCULAR | Status: DC

## 2013-04-15 MED ORDER — BUPIVACAINE LIPOSOME 1.3 % IJ SUSP
20.0000 mL | Freq: Once | INTRAMUSCULAR | Status: DC
  Filled 2013-04-15: qty 20

## 2013-04-15 MED ORDER — ONDANSETRON HCL 4 MG/2ML IJ SOLN
INTRAMUSCULAR | Status: DC | PRN
  Administered 2013-04-15: 4 mg via INTRAVENOUS

## 2013-04-15 MED ORDER — DEXAMETHASONE SODIUM PHOSPHATE 10 MG/ML IJ SOLN
INTRAMUSCULAR | Status: DC | PRN
  Administered 2013-04-15: 10 mg via INTRAVENOUS

## 2013-04-15 MED ORDER — SODIUM CHLORIDE 0.9 % IV SOLN: 250.0000 mL | INTRAVENOUS | Status: DC | PRN

## 2013-04-15 MED ORDER — OXYCODONE HCL 5 MG/5ML PO SOLN
5.0000 mg | Freq: Once | ORAL | Status: DC | PRN
  Filled 2013-04-15: qty 5

## 2013-04-15 MED ORDER — LACTATED RINGERS IV SOLN
INTRAVENOUS | Status: DC | PRN
  Administered 2013-04-15: 09:00:00 via INTRAVENOUS

## 2013-04-15 MED ORDER — CEFAZOLIN SODIUM-DEXTROSE 2-3 GM-% IV SOLR
2.0000 g | Freq: Once | INTRAVENOUS | Status: AC
  Administered 2013-04-15: 2 g via INTRAVENOUS

## 2013-04-15 MED ORDER — PROMETHAZINE HCL 25 MG/ML IJ SOLN: 6.2500 mg | INTRAMUSCULAR | Status: DC | PRN

## 2013-04-15 MED ORDER — PROPOFOL 10 MG/ML IV BOLUS
INTRAVENOUS | Status: DC | PRN
  Administered 2013-04-15: 150 mg via INTRAVENOUS

## 2013-04-15 MED ORDER — OXYCODONE HCL 5 MG PO TABS: 5.0000 mg | ORAL_TABLET | Freq: Once | ORAL | Status: DC | PRN

## 2013-04-15 MED ORDER — HYDROMORPHONE HCL PF 1 MG/ML IJ SOLN: 0.2500 mg | INTRAMUSCULAR | Status: DC | PRN

## 2013-04-15 MED ORDER — OXYCODONE HCL 5 MG PO TABS: 5.0000 mg | ORAL_TABLET | ORAL | Status: DC | PRN

## 2013-04-15 MED ORDER — LIDOCAINE HCL (CARDIAC) 20 MG/ML IV SOLN
INTRAVENOUS | Status: DC | PRN
  Administered 2013-04-15: 100 mg via INTRAVENOUS

## 2013-04-15 MED ORDER — ACETAMINOPHEN 650 MG RE SUPP: 650.0000 mg | RECTAL | Status: DC | PRN

## 2013-04-15 MED ORDER — ACETAMINOPHEN 325 MG PO TABS: 650.0000 mg | ORAL_TABLET | ORAL | Status: DC | PRN

## 2013-04-15 MED ORDER — MIDAZOLAM HCL 5 MG/5ML IJ SOLN
INTRAMUSCULAR | Status: DC | PRN
  Administered 2013-04-15: 2 mg via INTRAVENOUS

## 2013-04-15 MED ORDER — MEPERIDINE HCL 50 MG/ML IJ SOLN: 6.2500 mg | INTRAMUSCULAR | Status: DC | PRN

## 2013-04-15 MED ORDER — ONDANSETRON HCL 4 MG/2ML IJ SOLN: 4.0000 mg | Freq: Four times a day (QID) | INTRAMUSCULAR | Status: DC | PRN

## 2013-04-15 MED ORDER — CEFAZOLIN SODIUM-DEXTROSE 2-3 GM-% IV SOLR
INTRAVENOUS | Status: AC
  Filled 2013-04-15: qty 50

## 2013-04-15 MED ORDER — MORPHINE SULFATE 10 MG/ML IJ SOLN: 2.0000 mg | INTRAMUSCULAR | Status: DC | PRN

## 2013-04-15 SURGICAL SUPPLY — 33 items
BLADE HEX COATED 2.75 (ELECTRODE) ×2 IMPLANT
BLADE SURG 15 STRL LF DISP TIS (BLADE) ×1 IMPLANT
BLADE SURG 15 STRL SS (BLADE) ×1
BRIEF STRETCH FOR OB PAD LRG (UNDERPADS AND DIAPERS) ×2 IMPLANT
CANISTER SUCTION 2500CC (MISCELLANEOUS) ×2 IMPLANT
CLOTH BEACON ORANGE TIMEOUT ST (SAFETY) ×2 IMPLANT
DECANTER SPIKE VIAL GLASS SM (MISCELLANEOUS) ×2 IMPLANT
DRAPE LG THREE QUARTER DISP (DRAPES) ×2 IMPLANT
DRSG PAD ABDOMINAL 8X10 ST (GAUZE/BANDAGES/DRESSINGS) IMPLANT
ELECT REM PT RETURN 9FT ADLT (ELECTROSURGICAL) ×2
ELECTRODE REM PT RTRN 9FT ADLT (ELECTROSURGICAL) ×1 IMPLANT
GAUZE SPONGE 4X4 16PLY XRAY LF (GAUZE/BANDAGES/DRESSINGS) ×2 IMPLANT
GLOVE BIOGEL PI IND STRL 7.0 (GLOVE) ×1 IMPLANT
GLOVE BIOGEL PI INDICATOR 7.0 (GLOVE) ×1
GLOVE ECLIPSE 8.0 STRL XLNG CF (GLOVE) ×2 IMPLANT
GLOVE INDICATOR 8.0 STRL GRN (GLOVE) ×4 IMPLANT
GOWN STRL NON-REIN LRG LVL3 (GOWN DISPOSABLE) ×2 IMPLANT
GOWN STRL REIN XL XLG (GOWN DISPOSABLE) ×4 IMPLANT
KIT BASIN OR (CUSTOM PROCEDURE TRAY) ×2 IMPLANT
LUBRICANT JELLY K Y 4OZ (MISCELLANEOUS) ×2 IMPLANT
NEEDLE HYPO 25X1 1.5 SAFETY (NEEDLE) ×2 IMPLANT
NS IRRIG 1000ML POUR BTL (IV SOLUTION) ×2 IMPLANT
PACK LITHOTOMY IV (CUSTOM PROCEDURE TRAY) ×2 IMPLANT
PENCIL BUTTON HOLSTER BLD 10FT (ELECTRODE) ×2 IMPLANT
SHEARS HARMONIC 9CM CVD (BLADE) IMPLANT
SPONGE GAUZE 4X4 12PLY (GAUZE/BANDAGES/DRESSINGS) IMPLANT
SPONGE SURGIFOAM ABS GEL 100 (HEMOSTASIS) IMPLANT
SPONGE SURGIFOAM ABS GEL 12-7 (HEMOSTASIS) ×2 IMPLANT
SUT CHROMIC 2 0 SH (SUTURE) IMPLANT
SUT CHROMIC 3 0 SH 27 (SUTURE) IMPLANT
SYR CONTROL 10ML LL (SYRINGE) ×2 IMPLANT
TOWEL OR 17X26 10 PK STRL BLUE (TOWEL DISPOSABLE) ×2 IMPLANT
YANKAUER SUCT BULB TIP 10FT TU (MISCELLANEOUS) ×2 IMPLANT

## 2013-04-15 NOTE — Anesthesia Preprocedure Evaluation (Addendum)
Anesthesia Evaluation  Patient identified by MRN, date of birth, ID band Patient awake    Reviewed: Allergy & Precautions, H&P , NPO status , Patient's Chart, lab work & pertinent test results  Airway Mallampati: III TM Distance: >3 FB Neck ROM: Full    Dental  (+) Dental Advisory Given and Edentulous Upper   Pulmonary shortness of breath and with exertion, Current Smoker,  breath sounds clear to auscultation        Cardiovascular hypertension, Pt. on medications - CAD and - Past MI Rhythm:Regular Rate:Normal     Neuro/Psych PSYCHIATRIC DISORDERS Anxiety Depression    GI/Hepatic negative GI ROS, Neg liver ROS,   Endo/Other  negative endocrine ROS  Renal/GU ARFRenal disease     Musculoskeletal  (+) Arthritis -, Rheumatoid disorders,    Abdominal   Peds  Hematology negative hematology ROS (+)   Anesthesia Other Findings   Reproductive/Obstetrics negative OB ROS                          Anesthesia Physical  Anesthesia Plan  ASA: II  Anesthesia Plan: General   Post-op Pain Management:    Induction: Intravenous  Airway Management Planned: LMA and Oral ETT  Additional Equipment:   Intra-op Plan:   Post-operative Plan: Extubation in OR  Informed Consent: I have reviewed the patients History and Physical, chart, labs and discussed the procedure including the risks, benefits and alternatives for the proposed anesthesia with the patient or authorized representative who has indicated his/her understanding and acceptance.   Dental advisory given  Plan Discussed with: CRNA  Anesthesia Plan Comments:        Anesthesia Quick Evaluation

## 2013-04-15 NOTE — Interval H&P Note (Signed)
History and Physical Interval Note:  04/15/2013 10:17 AM  Regina Ortiz  has presented today for surgery, with the diagnosis of External hemorrhoid with AIN  The various methods of treatment have been discussed with the patient and family. After consideration of risks, benefits and other options for treatment, the patient has consented to  Procedure(s): Excision of external hemorrhoid with AIN (N/A) as a surgical intervention .  The patient's history has been reviewed, patient examined, no change in status, stable for surgery.  I have reviewed the patient's chart and labs.  Questions were answered to the patient's satisfaction.     Clarissa Laird Shela Commons

## 2013-04-15 NOTE — Anesthesia Postprocedure Evaluation (Signed)
Anesthesia Post Note  Patient: Regina Ortiz  Procedure(s) Performed: Procedure(s) (LRB): Excision of external and internal  hemorrhoid with AIN, Exam under anesthesia (N/A)  Anesthesia type: General  Patient location: PACU  Post pain: Pain level controlled  Post assessment: Post-op Vital signs reviewed  Last Vitals: BP 119/70  Pulse 78  Temp(Src) 36.1 C  Resp 13  SpO2 98%  Post vital signs: Reviewed  Level of consciousness: sedated  Complications: No apparent anesthesia complications

## 2013-04-15 NOTE — H&P (View-Only) (Signed)
Patient ID: Regina Ortiz, female   DOB: 07/12/1946, 67 y.o.   MRN: [id number]  Chief Complaint  Patient presents with  . New Evaluation    eval lesion on ext hems    HPI Regina Ortiz is a 67 y.o. female.   HPI  She is referred by Dr. Wendy Brewster for further evaluation of possible AIN on an external hemorrhoid at the 10:00 position. She has had VIN in the past and has had treatment for this ,by Dr. Brewster, surgically. She tried Aldara cream but was not able to put this well.  Dr. Brewster recently examined her and placed acetic acid in the angle and fullbore areas. There was acetowhite staining on the external hemorrhoid at the 10:00 position. She has no itching or anal bleeding.  Past Medical History  Diagnosis Date  . Dysplasia     Multifocal genital, VIN and VAIN  . Arthritis   . Hypertension   . Osteoporosis   . History of anal fissures   . RA (rheumatoid arthritis)   . Bruises easily   . Nocturia   . Smokers' cough   . Depression   . Anxiety   . COPD (chronic obstructive pulmonary disease)   . Short of breath on exertion   . LBBB (left bundle branch block) W/ PROLONGED PR    CARDIOLOGSIT-  DR NISHAN-   LOV IN EPIC    Past Surgical History  Procedure Laterality Date  . Wide local excision of labia majora  04-04-2010    Right-sided lesion, CO2 ablation of right labia minora  . Excision vulvar lesions    . Appendectomy  1992  . Vaginal hysterectomy  1997  . Repair fistula in ano/  i & d perirectal abscess  10-26-2002  DR MATTHEW MARTIN  . Cardiovascular stress test  06-23-2012  DR PETER NISHAN    NORMAL LEXISCAN MYOVIEW/ EF 83%  . Transthoracic echocardiogram  06-23-2012    NORMAL LVSF/ EF 55-60%  . Co2 laser application  07/22/2012    Procedure: CO2 LASER APPLICATION;  Surgeon: Wendy Brewster, MD PHD;  Location: [facility];  Service: Gynecology;  Laterality: N/A;  Laser Vaporization    Family History  Problem Relation Age of Onset  . Diabetes  Mother   . Hypertension Father   . Lymphoma Son   . Cancer Son     lymphoblastic lymphoma    Social History History  Substance Use Topics  . Smoking status: Current Every Day Smoker -- 0.50 packs/day for 50 years    Types: Cigarettes  . Smokeless tobacco: Never Used  . Alcohol Use: 7.2 oz/week    12 Cans of beer per week     Comment: 12-15 beers a week    Allergies  Allergen Reactions  . Latex Swelling and Other (See Comments)     fever blisters  . Lactose Intolerance (Gi)   . Prinivil (Lisinopril) Diarrhea  . Triamcinolone Acetonide Other (See Comments)    REDNESS AND PAIN  . Amlodipine Rash  . Lotensin (Benazepril Hcl) Rash    Current Outpatient Prescriptions  Medication Sig Dispense Refill  . alendronate (FOSAMAX) 70 MG tablet Take 70 mg by mouth every 7 (seven) days. Take with a full glass of water on an empty stomach. Saturday      . aspirin 81 MG tablet Take 81 mg by mouth every morning.       . B Complex-C (B-COMPLEX WITH VITAMIN C) tablet Take 1 tablet by mouth   every morning.      . buPROPion (WELLBUTRIN SR) 150 MG 12 hr tablet Take 150 mg by mouth every morning.       . busPIRone (BUSPAR) 15 MG tablet Take 15 mg by mouth 2 (two) times daily.      . Calcium Carbonate-Vitamin D (CALTRATE 600+D PO) Take 1 tablet by mouth every morning.       . Cholecalciferol (VITAMIN D3) 1000 UNITS CAPS Take 1 capsule by mouth every morning.       . cycloSPORINE (RESTASIS) 0.05 % ophthalmic emulsion Place 1 drop into both eyes 2 (two) times daily.      . cycloSPORINE (RESTASIS) 0.05 % ophthalmic emulsion Place 1 drop into both eyes 2 (two) times daily.      . diltiazem (TIAZAC) 360 MG 24 hr capsule Take 360 mg by mouth every morning.      . etanercept (ENBREL) 50 MG/ML injection Inject 50 mg into the skin once a week. TUESDAYS      . Lysine 500 MG TABS Take 1 tablet by mouth 2 (two) times daily.      . predniSONE (DELTASONE) 5 MG tablet Take 5 mg by mouth every morning.      .  PRENATAL VITAMINS PO Take 1 tablet by mouth every morning.       . valsartan (DIOVAN) 160 MG tablet Take 160 mg by mouth every morning.       No current facility-administered medications for this visit.    Review of Systems Review of Systems  Constitutional: Positive for unexpected weight change (Following an illness earlier this year.).  Cardiovascular: Positive for leg swelling (Around the ankles.).  Gastrointestinal: Positive for abdominal pain and diarrhea.       Release somewhat by a bowel movement.  Neurological: Positive for headaches.  Hematological: Bruises/bleeds easily.    Blood pressure 120/64, pulse 96, temperature 98.6 F (37 C), temperature source Temporal, resp. rate 14, height 5' 3.5" (1.613 m), weight 98 lb 9.6 oz (44.725 kg).  Physical Exam Physical Exam  Constitutional: She appears well-developed and well-nourished. No distress.  HENT:  Head: Normocephalic and atraumatic.  Cardiovascular: Normal rate and regular rhythm.   Abdominal: Soft. She exhibits no distension and no mass. There is no tenderness.  Genitourinary:  External hemorrhoid at the 10:00 position with no visible irregularity.  Musculoskeletal: She exhibits no edema.  Skin: Skin is warm and dry.    Data Reviewed Dr. Brewster's note.  Assessment    History of VIN now with suspicion of a AIN on an external hemorrhoid at the 10:00 position.     Plan    Excision of external hemorrhoid AIN.  I discussed the procedure and risks with her. Risks include but are not limited to bleeding, infection, wound healing problems, anesthesia. She seems to understand all this and agrees with the plan.        Quintara Bost J 04/05/2013, 5:34 PM    

## 2013-04-15 NOTE — Transfer of Care (Signed)
Immediate Anesthesia Transfer of Care Note  Patient: Regina Ortiz  Procedure(s) Performed: Procedure(s): Excision of external and internal  hemorrhoid with AIN, Exam under anesthesia (N/A)  Patient Location: PACU  Anesthesia Type:General  Level of Consciousness: sedated  Airway & Oxygen Therapy: Patient Spontanous Breathing and Patient connected to face mask oxygen  Post-op Assessment: Report given to PACU RN and Post -op Vital signs reviewed and stable  Post vital signs: Reviewed and stable  Complications: No apparent anesthesia complications

## 2013-04-15 NOTE — Op Note (Signed)
Operative Note  Regina Ortiz female 67 y.o. 04/15/2013  PREOPERATIVE DX:  Hemorrhoid with possible AIN on it right anterior position  POSTOPERATIVE DX:  Same  PROCEDURE: Exam under anesthesia.  Anoscopy.  Single column right anterior hemorrhoidectomy (internal and external)         Surgeon: Adolph Pollack   Assistants: none  Anesthesia: General LMA anesthesia  Indications: This is a 67 year old female who has a history of VIN.  During one of her followup appointments with Dr. Nelly Rout she had an area that was suspicious for AIN at the 10:00 position on an external hemorrhoid.  She now presents for excision of that.    Procedure Detail:  She was brought to the operating room, laid supine on the operating table, and general anesthetic was given. She was placed in the lithotomy position. The perianal area was sterilely prepped and draped.  Digital rectal exam was performed no masses were noted. Anoscopy was performed and there were no suspicious internal lesions. The external hemorrhoid at the 10:00 position was grasped. A superficial incision was made around it sharply. Using electrocautery it was then excised. This was sent to pathology. The internal portion of the hemorrhoid was then grasped separately. It was clamped at its base and excised and sent separately as a specimen. The base was suture ligated with 2-0 chromic suture.  Bleeding from the area was controlled with electrocautery. The external site was left open and cauterized. Exparel was injected for a perianal block. Further inspection demonstrated adequate hemostasis.  A piece of Gelfoam was placed over the wound. A bulky dressing was applied.  She tolerated the procedure well without any apparent complications and was taken to the recovery room in satisfactory condition.   Estimated Blood Loss:  less than 100 mL         Drains: none  Blood Given: none          Specimens: External hemorrhoid 10:00 position.  Internal hemorrhoid 10 o'clock position.        Complications:  * No complications entered in OR log *         Disposition: PACU - hemodynamically stable.         Condition: stable

## 2013-04-18 ENCOUNTER — Telehealth (INDEPENDENT_AMBULATORY_CARE_PROVIDER_SITE_OTHER): Payer: Self-pay

## 2013-04-18 ENCOUNTER — Encounter (HOSPITAL_COMMUNITY): Payer: Self-pay | Admitting: General Surgery

## 2013-04-18 NOTE — Telephone Encounter (Signed)
Pt notified pathology benign hemorrhoidal tissue, not AIN.  Reminded of f/u appt with Dr. Abbey Chatters on 04/29/13.

## 2013-04-29 ENCOUNTER — Encounter (INDEPENDENT_AMBULATORY_CARE_PROVIDER_SITE_OTHER): Payer: Self-pay | Admitting: General Surgery

## 2013-04-29 ENCOUNTER — Ambulatory Visit (INDEPENDENT_AMBULATORY_CARE_PROVIDER_SITE_OTHER): Payer: Medicare Other | Admitting: General Surgery

## 2013-04-29 VITALS — BP 120/70 | HR 72 | Temp 99.0°F | Resp 14 | Ht 63.5 in | Wt 98.2 lb

## 2013-04-29 DIAGNOSIS — Z9889 Other specified postprocedural states: Secondary | ICD-10-CM

## 2013-04-29 NOTE — Patient Instructions (Signed)
Clean the leg wound in warm soapy water daily, apply a thin coat of Neosporin, and then a dry bandage.

## 2013-04-29 NOTE — Progress Notes (Signed)
Procedure:  Hemorrhoidectomy  Date:  04/15/2013  Pathology:  No evidence of AIN  History:  She is here for her first postoperative visit. Pathology has been discussed with her. Her pain control is better. She's keeping the wound clean. She also told me that she had something fall on her right pretibial area and she would like me to look at that wound as well.  Exam: General- Is in NAD. Anorectal- The wound is open but is clean. Right lower extremity-there is a 2 cm wound in the pretibial area with little bit of dark skin present. No erythema or purulent drainage.  Assessment:  Hemorrhoid is removed and there is no evidence of anal intraepithelial neoplasia where the acetowhite area was noted.  Has a small posttraumatic right lower extremity wound. This is clean. Some of the skin may slough.  Plan:  Continue keeping in the wound clean. Clean leg with warm soapy water and apply a thin layer of Neosporin and a dry dressing daily. Return visit in 6 weeks.

## 2013-05-31 ENCOUNTER — Encounter (INDEPENDENT_AMBULATORY_CARE_PROVIDER_SITE_OTHER): Payer: Self-pay

## 2013-06-06 ENCOUNTER — Encounter (INDEPENDENT_AMBULATORY_CARE_PROVIDER_SITE_OTHER): Payer: Self-pay | Admitting: General Surgery

## 2013-06-06 ENCOUNTER — Ambulatory Visit (INDEPENDENT_AMBULATORY_CARE_PROVIDER_SITE_OTHER): Payer: Medicare Other | Admitting: General Surgery

## 2013-06-06 VITALS — BP 130/68 | HR 68 | Temp 97.0°F | Resp 16 | Ht 63.5 in | Wt 98.0 lb

## 2013-06-06 DIAGNOSIS — Z9889 Other specified postprocedural states: Secondary | ICD-10-CM

## 2013-06-06 NOTE — Patient Instructions (Signed)
Call if you have any problems with the wound.  The gentle when cleansing near anal area.

## 2013-06-06 NOTE — Progress Notes (Signed)
Procedure:  Hemorrhoidectomy  Date:  04/15/2013  Pathology:  No evidence of AIN  History:  She is here for her second postoperative visit and is doing much better.  Exam: General- Is in NAD. Anorectal- The wound has almost completely healed and is clean. There is a little bit of traumatic irritation around the anal area. Right lower extremity-The wound is healed with a small scab on it  Assessment: Anal wound has healed nicely. Right lower extremity wound has healed as well. A little bit of traumatic irritation to the anal area from overzealous cleansing.  Plan: Be more careful cleansing the anal area. Followup with me when necessary.

## 2013-07-06 ENCOUNTER — Encounter: Payer: Self-pay | Admitting: Cardiology

## 2013-07-06 DIAGNOSIS — M069 Rheumatoid arthritis, unspecified: Secondary | ICD-10-CM | POA: Insufficient documentation

## 2013-07-06 DIAGNOSIS — I447 Left bundle-branch block, unspecified: Secondary | ICD-10-CM | POA: Insufficient documentation

## 2013-07-06 DIAGNOSIS — F419 Anxiety disorder, unspecified: Secondary | ICD-10-CM | POA: Insufficient documentation

## 2013-07-06 DIAGNOSIS — F329 Major depressive disorder, single episode, unspecified: Secondary | ICD-10-CM | POA: Insufficient documentation

## 2013-07-06 DIAGNOSIS — E871 Hypo-osmolality and hyponatremia: Secondary | ICD-10-CM | POA: Insufficient documentation

## 2013-07-06 DIAGNOSIS — I10 Essential (primary) hypertension: Secondary | ICD-10-CM | POA: Insufficient documentation

## 2013-07-06 DIAGNOSIS — R0602 Shortness of breath: Secondary | ICD-10-CM | POA: Insufficient documentation

## 2013-07-06 DIAGNOSIS — I872 Venous insufficiency (chronic) (peripheral): Secondary | ICD-10-CM | POA: Insufficient documentation

## 2013-07-06 DIAGNOSIS — K649 Unspecified hemorrhoids: Secondary | ICD-10-CM | POA: Insufficient documentation

## 2013-07-07 ENCOUNTER — Encounter: Payer: Self-pay | Admitting: Cardiology

## 2013-07-07 ENCOUNTER — Ambulatory Visit (INDEPENDENT_AMBULATORY_CARE_PROVIDER_SITE_OTHER): Payer: Medicare Other | Admitting: Cardiology

## 2013-07-07 VITALS — BP 166/88 | HR 81 | Ht 63.5 in | Wt 97.8 lb

## 2013-07-07 DIAGNOSIS — M069 Rheumatoid arthritis, unspecified: Secondary | ICD-10-CM

## 2013-07-07 DIAGNOSIS — I447 Left bundle-branch block, unspecified: Secondary | ICD-10-CM

## 2013-07-07 DIAGNOSIS — E441 Mild protein-calorie malnutrition: Secondary | ICD-10-CM | POA: Insufficient documentation

## 2013-07-07 DIAGNOSIS — F17200 Nicotine dependence, unspecified, uncomplicated: Secondary | ICD-10-CM

## 2013-07-07 DIAGNOSIS — J42 Unspecified chronic bronchitis: Secondary | ICD-10-CM

## 2013-07-07 DIAGNOSIS — Z720 Tobacco use: Secondary | ICD-10-CM | POA: Insufficient documentation

## 2013-07-07 NOTE — Patient Instructions (Signed)
Your physician recommends that you continue on your current medications as directed. Please refer to the Current Medication list given to you today.  Your physician wants you to follow-up in: 1 year with Dr. Skains. You will receive a reminder letter in the mail two months in advance. If you don't receive a letter, please call our office to schedule the follow-up appointment.  

## 2013-07-07 NOTE — Progress Notes (Signed)
[address] [place], [place]  [address] Phone: [phone] Fax:  [phone]  Date:  07/07/2013   ID:  Regina Ortiz, DOB 03-27-1946, MRN [id number]  PCP:  Ronnie Doss   History of Present Illness: Regina Ortiz is a 67 y.o. female HTN, COPD, History of rheumatoid arthritis who was recently going in for surgery at [facility] and was noted to have LBBB. Anesthesiology stopped surgery. Admitted her. Had stress test (NUC low risk-Nishan) and ECHO and was normal. Diltiazem was stopped by hospitalist due to LBBB. HCTZ was restarted but with hisory of hyponatremia (may have been from over drinking water at one point) this was stopped. She is back on her diltiazem and is doing quite well. She has not demonstrated any evidence of dizziness, syncope in relation to her left bundle branch block. Her heart rate is in the 80s. Her blood pressure is much better controlled.  Feeling stressed. Carilyn Goodpasture has helped significantly she says.     Wt Readings from Last 3 Encounters:  07/07/13 97 lb 12.8 oz (44.362 kg)  06/06/13 98 lb (44.453 kg)  04/29/13 98 lb 3.2 oz (44.543 kg)     Past Medical History  Diagnosis Date  . Dysplasia     Multifocal genital, VIN and VAIN  . Hypertension   . Osteoporosis   . Bruises easily   . Nocturia   . Smokers' cough   . Depression   . Anxiety   . Short of breath on exertion   . LBBB (left bundle branch block) W/ PROLONGED PR    CARDIOLOGSIT-  DR Eden Emms-   LOV IN EPIC  . Hemorrhoid   . Macrocytosis   . Chronic hyponatremia   . Venous insufficiency of leg     , Edema  . Chronic hyponatremia   . Seronegative rheumatoid arthritis     Past Surgical History  Procedure Laterality Date  . Wide local excision of labia majora  04-04-2010    Right-sided lesion, CO2 ablation of right labia minora  . Excision vulvar lesions  07/2012  . Appendectomy  1992  . Repair fistula in ano/  i & d perirectal abscess  10-26-2002  DR  Luretha Murphy  . Cardiovascular stress test  06-23-2012  DR [name]    NORMAL LEXISCAN MYOVIEW/ EF 83%  . Transthoracic echocardiogram  06-23-2012    NORMAL LVSF/ EF 55-60%  . Co2 laser application  07/22/2012    Procedure: CO2 LASER APPLICATION;  Surgeon: Laurette Schimke, MD PHD;  Location: [facility];  Service: Gynecology;  Laterality: N/A;  Laser Vaporization  . Vaginal hysterectomy  1997    partial  . Hemorrhoid surgery N/A 04/15/2013    Procedure: Excision of external and internal  hemorrhoid with AIN, Exam under anesthesia;  Surgeon: Adolph Pollack, MD;  Location: [facility];  Service: General;  Laterality: N/A;    Current Outpatient Prescriptions  Medication Sig Dispense Refill  . alendronate (FOSAMAX) 70 MG tablet Take 70 mg by mouth every 7 (seven) days. Take with a full glass of water on an empty stomach. Saturday      . aspirin 81 MG tablet Take 81 mg by mouth every morning.       Marland Kitchen buPROPion (WELLBUTRIN SR) 150 MG 12 hr tablet Take 150 mg by mouth every morning.       . busPIRone (BUSPAR) 15 MG tablet 15 mg daily.      Marland Kitchen  Calcium Carbonate-Vitamin D (CALTRATE 600+D PO) Take 1 tablet by mouth every morning.       . Cholecalciferol (VITAMIN D3) 1000 UNITS CAPS Take 1,000 Units by mouth every morning.       . clonazePAM (KLONOPIN) 0.5 MG tablet 0.5 mg. prn      . cycloSPORINE (RESTASIS) 0.05 % ophthalmic emulsion Place 1 drop into both eyes 2 (two) times daily.      Marland Kitchen diltiazem (TIAZAC) 360 MG 24 hr capsule Take 360 mg by mouth every morning.      . etanercept (ENBREL) 50 MG/ML injection Inject 50 mg into the skin once a week. TUESDAYS      . Lysine 500 MG TABS Take 500 mg by mouth 2 (two) times daily.       . Potassium (POTASSIMIN PO) Take 1 tablet by mouth daily.      . predniSONE (DELTASONE) 5 MG tablet Take 5 mg by mouth every morning.       Marland Kitchen PRENATAL VITAMINS PO Take 1 tablet by mouth every morning.       . Probiotic Product (ALIGN) 4 MG CAPS Take 1  tablet by mouth daily.      . traZODone (DESYREL) 50 MG tablet As directed      . valsartan (DIOVAN) 320 MG tablet Take 320 mg by mouth every morning.       No current facility-administered medications for this visit.    Allergies:    Allergies  Allergen Reactions  . Latex Swelling and Other (See Comments)     fever blisters  . Lactose Intolerance (Gi)   . Prinivil [Lisinopril] Diarrhea  . Triamcinolone Acetonide Other (See Comments)    REDNESS AND PAIN  . Amlodipine Rash  . Lotensin [Benazepril Hcl] Rash    Social History:  The patient  reports that she has been smoking Cigarettes.  She has a 25 pack-year smoking history. She has never used smokeless tobacco. She reports that she drinks alcohol. She reports that she does not use illicit drugs.   ROS:  Please see the history of present illness.   No syncope, no bleeding, no CP.     PHYSICAL EXAM: VS:  BP 166/88  Pulse 81  Ht 5' 3.5" (1.613 m)  Wt 97 lb 12.8 oz (44.362 kg)  BMI 17.05 kg/m2 Well nourished, well developed, in no acute distress HEENT: normal Neck: no JVD Cardiac:  normal S1, S2; RRR; no murmur Lungs:  clear to auscultation bilaterally, no wheezing, rhonchi or rales Abd: soft, nontender, no hepatomegaly Ext: no edema Skin: warm and dry Neuro: no focal abnormalities noted  EKG:  Sinus rhythm, left bundle branch block, no change     ASSESSMENT AND PLAN:  1. HTN - mildly elevated today however under good control at home. No changes made. Medications reviewed. 2. RA- Enbrel. Slow healing time. Causes skin changes. 3. Tobacco use - encourage cessation.she is down to 8 cigarettes a day. 4. Mild malnutrition-encouraged shakes. Discussed implications. 5. Chronic bronchitis-encourage tobacco cessation once again. Recent azithromycin usage. 6. Left bundle branch block-stable, no high-risk symptoms. Prior stress test without any ischemia.  Signed, Donato Schultz, MD [facility]  07/07/2013 4:18 PM

## 2013-09-22 ENCOUNTER — Encounter (INDEPENDENT_AMBULATORY_CARE_PROVIDER_SITE_OTHER): Payer: Self-pay

## 2013-09-22 ENCOUNTER — Ambulatory Visit: Payer: Medicare Other | Attending: Gynecologic Oncology | Admitting: Gynecologic Oncology

## 2013-09-22 VITALS — BP 144/59 | HR 81 | Temp 98.4°F | Resp 16 | Ht 63.58 in | Wt 97.1 lb

## 2013-09-22 DIAGNOSIS — D071 Carcinoma in situ of vulva: Secondary | ICD-10-CM | POA: Insufficient documentation

## 2013-09-22 DIAGNOSIS — F17200 Nicotine dependence, unspecified, uncomplicated: Secondary | ICD-10-CM | POA: Insufficient documentation

## 2013-09-22 DIAGNOSIS — F329 Major depressive disorder, single episode, unspecified: Secondary | ICD-10-CM | POA: Insufficient documentation

## 2013-09-22 DIAGNOSIS — F411 Generalized anxiety disorder: Secondary | ICD-10-CM | POA: Insufficient documentation

## 2013-09-22 DIAGNOSIS — M810 Age-related osteoporosis without current pathological fracture: Secondary | ICD-10-CM | POA: Insufficient documentation

## 2013-09-22 DIAGNOSIS — N893 Dysplasia of vagina, unspecified: Secondary | ICD-10-CM | POA: Insufficient documentation

## 2013-09-22 DIAGNOSIS — F3289 Other specified depressive episodes: Secondary | ICD-10-CM | POA: Insufficient documentation

## 2013-09-22 DIAGNOSIS — I10 Essential (primary) hypertension: Secondary | ICD-10-CM | POA: Insufficient documentation

## 2013-09-22 DIAGNOSIS — J42 Unspecified chronic bronchitis: Secondary | ICD-10-CM | POA: Insufficient documentation

## 2013-09-22 NOTE — Patient Instructions (Signed)
Follow-up in 1 year.  Happy New Year   Thank you very much Ms. Regina Ortiz for allowing me to provide care for you today.  I appreciate your confidence in choosing our Gynecologic Oncology team.  If you have any questions about your visit today please call our office and we will get back to you as soon as possible.  Francetta Found. Kadir Azucena MD., PhD Gynecologic Oncology

## 2013-09-22 NOTE — Progress Notes (Signed)
Office Visit Note: Gynecoloic -Oncology  CC: VIN surveillance  Assessment/Plan:  This is a 68 y.o. with recurrent multifocal vulvar dysplasia. S/P  CO2 laser ablation of the external genitalia on 06/22/2012.  F/U in 12  months Advised to reduce tobacco use   HPI:  HISTORY OF PRESENT ILLNESS: This is a 68 year old with multifocal lower genital tract dysplasia, both VIN and VAIN. History is notable for hysterectomy for carcinoma in situ in 1997. She subsequently underwent  upper vaginal colpectomies in 1983 and 2000 for vaginal dysplasia. In June 2011, a vulva biopsy demonstrated VIN III. In August 2011, she  underwent wide local excision of a right-sided inferior labia majora lesion and CO2 ablation of the right labia minora. She denies any  vulvar pruritus or discharge.   Pap 2010, 2011, 2012  2013 wnl.  Patient seen 01/2012.  Bx c/w VIN III.  Patient given trial of Aldara.  She stated that it has been difficult to apply Aldara to the external genitalia at the site of the lesions and requested CO2 laser treatment.  She underwent Co2 laser ablation of the external genitalia on 06/22/2012.  Pap 03/2013 wnl  At the prior visit AIN was appreciated.  Patient underwent excision of an external and internal hemorrhoid with AIN 04/2013.   REVIEW OF SYSTEMS: No vulva pruritus, adenopathy, cough, shortness of  breath; vulva or vaginal bleeding. No chest pain, nausea, vomiting, diarrhea, constipation,.  No urinary incontinence, vaginal bleeding or discharge.  Review of other systems non contributory.   Past Surgical Hx:  Past Surgical History  Procedure Laterality Date  . Wide local excision of labia majora  04-04-2010    Right-sided lesion, CO2 ablation of right labia minora  . Excision vulvar lesions  07/2012  . Appendectomy  1992  . Repair fistula in ano/  i & d perirectal abscess  10-26-2002  DR Johnathan Hausen  . Cardiovascular stress test  06-23-2012  DR Jenkins Rouge    NORMAL LEXISCAN MYOVIEW/  EF 83%  . Transthoracic echocardiogram  06-23-2012    NORMAL LVSF/ EF 55-60%  . Co2 laser application  78/46/9629    Procedure: CO2 LASER APPLICATION;  Surgeon: Janie Morning, MD PHD;  Location: [facility];  Service: Gynecology;  Laterality: N/A;  Laser Vaporization  . Vaginal hysterectomy  1997    partial  . Hemorrhoid surgery N/A 04/15/2013    Procedure: Excision of external and internal  hemorrhoid with AIN, Exam under anesthesia;  Surgeon: Odis Hollingshead, MD;  Location: [facility];  Service: General;  Laterality: N/A;    Past Medical Hx:  Past Medical History  Diagnosis Date  . Dysplasia     Multifocal genital, VIN and VAIN  . Hypertension   . Osteoporosis   . Bruises easily   . Nocturia   . Smokers' cough   . Depression   . Anxiety   . Short of breath on exertion   . LBBB (left bundle branch block) W/ PROLONGED PR    CARDIOLOGSIT-  DR Johnsie Cancel-   LOV IN EPIC  . Hemorrhoid   . Macrocytosis   . Chronic hyponatremia   . Venous insufficiency of leg     , Edema  . Chronic hyponatremia   . Seronegative rheumatoid arthritis   . Mild malnutrition 07/07/2013  . Chronic bronchitis 07/07/2013  . Tobacco abuse 07/07/2013    Family Hx:  Family History  Problem Relation Age of Onset  . Diabetes Mother   . Hypertension Father   .  Lymphoma Son   . Cancer Son     lymphoblastic lymphoma     Vitals:  Blood pressure 144/59, pulse 81, temperature 98.4 F (36.9 C), temperature source Oral, resp. rate 16, height 5' 3.58" (1.615 m), weight 97 lb 1.6 oz (44.044 kg).  Physical Exam: WD thin female in NAD BP 144/59  Pulse 81  Temp(Src) 98.4 F (36.9 C) (Oral)  Resp 16  Ht 5' 3.58" (1.615 m)  Wt 97 lb 1.6 oz (44.044 kg)  BMI 16.89 kg/m2 LN:  No cervical supraclavicular or inguinal adenopathy PELVIC: External genitalia with extensive scaring.  Has healed well. Acetic acid applied without any acetowhite changes.  No pelvic masses or nodularity RECTAL:  No perianal  lesions noted EXT:  No CCE    Janie Morning, MD., PhD. 09/22/2013, 2:53 PM   .

## 2014-05-17 ENCOUNTER — Other Ambulatory Visit: Payer: Self-pay | Admitting: Gastroenterology

## 2014-10-12 ENCOUNTER — Other Ambulatory Visit (HOSPITAL_COMMUNITY)
Admission: RE | Admit: 2014-10-12 | Discharge: 2014-10-12 | Disposition: A | Discharge status: Home / Self Care | Payer: Medicare Other | Source: Ambulatory Visit | Attending: Gynecologic Oncology | Admitting: Gynecologic Oncology

## 2014-10-12 ENCOUNTER — Ambulatory Visit: Payer: Medicare Other | Attending: Gynecologic Oncology | Admitting: Gynecologic Oncology

## 2014-10-12 ENCOUNTER — Encounter: Payer: Self-pay | Admitting: Gynecologic Oncology

## 2014-10-12 VITALS — BP 155/61 | HR 94 | Temp 98.4°F | Resp 18 | Ht 64.0 in | Wt 105.8 lb

## 2014-10-12 DIAGNOSIS — Z1151 Encounter for screening for human papillomavirus (HPV): Secondary | ICD-10-CM | POA: Diagnosis present

## 2014-10-12 DIAGNOSIS — Z8544 Personal history of malignant neoplasm of other female genital organs: Secondary | ICD-10-CM | POA: Insufficient documentation

## 2014-10-12 DIAGNOSIS — Z720 Tobacco use: Secondary | ICD-10-CM

## 2014-10-12 DIAGNOSIS — N893 Dysplasia of vagina, unspecified: Secondary | ICD-10-CM | POA: Insufficient documentation

## 2014-10-12 DIAGNOSIS — N903 Dysplasia of vulva, unspecified: Secondary | ICD-10-CM

## 2014-10-12 DIAGNOSIS — K6282 Dysplasia of anus: Secondary | ICD-10-CM | POA: Diagnosis not present

## 2014-10-12 DIAGNOSIS — R87619 Unspecified abnormal cytological findings in specimens from cervix uteri: Secondary | ICD-10-CM | POA: Diagnosis present

## 2014-10-12 DIAGNOSIS — Z90710 Acquired absence of both cervix and uterus: Secondary | ICD-10-CM | POA: Insufficient documentation

## 2014-10-12 DIAGNOSIS — Z08 Encounter for follow-up examination after completed treatment for malignant neoplasm: Secondary | ICD-10-CM | POA: Diagnosis not present

## 2014-10-12 DIAGNOSIS — F17200 Nicotine dependence, unspecified, uncomplicated: Secondary | ICD-10-CM | POA: Diagnosis not present

## 2014-10-12 DIAGNOSIS — Z124 Encounter for screening for malignant neoplasm of cervix: Secondary | ICD-10-CM | POA: Insufficient documentation

## 2014-10-12 NOTE — Patient Instructions (Signed)
We will contact you with the results of your pap and biopsy.  Plan to follow up in one year if no action needed on biopsy.  Please call for any questions or concerns.

## 2014-10-12 NOTE — Progress Notes (Signed)
Office Visit Note: Gynecoloic -Oncology  CC: VIN surveillance  Assessment/Plan:  This is a 69 y.o. with recurrent multifocal vulvar dysplasia. S/P  CO2 laser ablation of the external genitalia on 06/22/2012.  Will call with the results of the pap and labial biopsy F/U in 12  months Advised to reduce tobacco use   HPI:  HISTORY OF PRESENT ILLNESS: This is a 69 y.o. with multifocal lower genital tract dysplasia, both VIN and VAIN. History is notable for hysterectomy for carcinoma in situ in 1997. She subsequently underwent  upper vaginal colpectomies in 1983 and 2000 for vaginal dysplasia. In June 2011, a vulva biopsy demonstrated VIN III. In August 2011, she  underwent wide local excision of a right-sided inferior labia majora lesion and CO2 ablation of the right labia minora. She denies any  vulvar pruritus or discharge.   Pap 2010, 2011, 2012  2013 wnl.  Patient seen 01/2012.  Bx c/w VIN III.  Patient given trial of Aldara.  She stated that it has been difficult to apply Aldara to the external genitalia at the site of the lesions and requested CO2 laser treatment.  She underwent Co2 laser ablation of the external genitalia on 06/22/2012.  Pap 03/2013 wnl  At the prior visit AIN was appreciated.  Patient underwent excision of an external and internal hemorrhoid with AIN 04/2013.   REVIEW OF SYSTEMS: No vulva pruritus, adenopathy, cough, shortness of  breath; vulva or vaginal bleeding. No chest pain, nausea, vomiting, diarrhea, constipation,.  No urinary incontinence, vaginal bleeding or discharge.  Review of other systems non contributory.   Past Surgical Hx:  Past Surgical History  Procedure Laterality Date  . Wide local excision of labia majora  04-04-2010    Right-sided lesion, CO2 ablation of right labia minora  . Excision vulvar lesions  07/2012  . Appendectomy  1992  . Repair fistula in ano/  i & d perirectal abscess  10-26-2002  DR Johnathan Hausen  . Cardiovascular stress test   06-23-2012  DR Jenkins Rouge    NORMAL LEXISCAN MYOVIEW/ EF 83%  . Transthoracic echocardiogram  06-23-2012    NORMAL LVSF/ EF 55-60%  . Co2 laser application  36/14/4315    Procedure: CO2 LASER APPLICATION;  Surgeon: Janie Morning, MD PHD;  Location: [facility];  Service: Gynecology;  Laterality: N/A;  Laser Vaporization  . Vaginal hysterectomy  1997    partial  . Hemorrhoid surgery N/A 04/15/2013    Procedure: Excision of external and internal  hemorrhoid with AIN, Exam under anesthesia;  Surgeon: Odis Hollingshead, MD;  Location: [facility];  Service: General;  Laterality: N/A;    Past Medical Hx:  Past Medical History  Diagnosis Date  . Dysplasia     Multifocal genital, VIN and VAIN  . Hypertension   . Osteoporosis   . Bruises easily   . Nocturia   . Smokers' cough   . Depression   . Anxiety   . Short of breath on exertion   . LBBB (left bundle branch block) W/ PROLONGED PR    CARDIOLOGSIT-  DR Johnsie Cancel-   LOV IN EPIC  . Hemorrhoid   . Macrocytosis   . Chronic hyponatremia   . Venous insufficiency of leg     , Edema  . Chronic hyponatremia   . Seronegative rheumatoid arthritis   . Mild malnutrition 07/07/2013  . Chronic bronchitis 07/07/2013  . Tobacco abuse 07/07/2013    Family Hx:  Family History  Problem Relation Age of  Onset  . Diabetes Mother   . Hypertension Father   . Lymphoma Son   . Cancer Son     lymphoblastic lymphoma     Vitals:  Blood pressure 155/61, pulse 94, temperature 98.4 F (36.9 C), temperature source Oral, resp. rate 18, height 5\' 4"  (1.626 m), weight 105 lb 12.8 oz (47.991 kg), SpO2 99 %.  Physical Exam: WD thin female in NAD LN:  No cervical supraclavicular or inguinal adenopathy PELVIC: External genitalia with extensive scaring.  Acetic acid applied.  Aceto white changes noted on the left labia minora.  Are prepped anextethized and a 65mm punch biopsy was collected.  Hemostasis achieved with pressure. Vagina atrophic, no  masses.Pap collected. RECTAL:  No perianal lesions noted EXT:  No CCE    Janie Morning, MD., PhD. 10/12/2014, 2:42 PM   .

## 2014-10-17 LAB — CYTOLOGY - PAP

## 2014-10-18 ENCOUNTER — Telehealth: Payer: Self-pay | Admitting: Gynecologic Oncology

## 2014-10-18 NOTE — Telephone Encounter (Signed)
Patient informed of biopsy results and Dr. Leone Brand recommendations for a laser procedure.  Laser will be arranged for March 4 with Dr. Denman George.  Advised to call for any questions or concerns.

## 2014-10-31 ENCOUNTER — Encounter (HOSPITAL_BASED_OUTPATIENT_CLINIC_OR_DEPARTMENT_OTHER): Payer: Self-pay | Admitting: *Deleted

## 2014-10-31 NOTE — Progress Notes (Addendum)
NPO AFTER MN. ARRIVE AT 0700. NEEDS ISTAT AND EKG. WILL TAKE PREDNISONE, PRILOSEC, DILTIAZEM, BURPAR AND IMODIUM IF NEEDED W/ SIPS OF WATER DOS.

## 2014-11-03 ENCOUNTER — Ambulatory Visit (HOSPITAL_BASED_OUTPATIENT_CLINIC_OR_DEPARTMENT_OTHER): Payer: Medicare Other | Admitting: Anesthesiology

## 2014-11-03 ENCOUNTER — Ambulatory Visit (HOSPITAL_BASED_OUTPATIENT_CLINIC_OR_DEPARTMENT_OTHER)
Admission: RE | Admit: 2014-11-03 | Discharge: 2014-11-03 | Disposition: A | Discharge status: Home / Self Care | Payer: Medicare Other | Source: Ambulatory Visit | Attending: Gynecologic Oncology | Admitting: Gynecologic Oncology

## 2014-11-03 ENCOUNTER — Encounter (HOSPITAL_BASED_OUTPATIENT_CLINIC_OR_DEPARTMENT_OTHER)
Admission: RE | Disposition: A | Discharge status: Home / Self Care | Payer: Self-pay | Source: Ambulatory Visit | Attending: Gynecologic Oncology

## 2014-11-03 ENCOUNTER — Encounter (HOSPITAL_BASED_OUTPATIENT_CLINIC_OR_DEPARTMENT_OTHER): Payer: Self-pay | Admitting: *Deleted

## 2014-11-03 DIAGNOSIS — F17200 Nicotine dependence, unspecified, uncomplicated: Secondary | ICD-10-CM | POA: Diagnosis not present

## 2014-11-03 DIAGNOSIS — I10 Essential (primary) hypertension: Secondary | ICD-10-CM | POA: Diagnosis not present

## 2014-11-03 DIAGNOSIS — Z9889 Other specified postprocedural states: Secondary | ICD-10-CM | POA: Insufficient documentation

## 2014-11-03 DIAGNOSIS — M810 Age-related osteoporosis without current pathological fracture: Secondary | ICD-10-CM | POA: Insufficient documentation

## 2014-11-03 DIAGNOSIS — I252 Old myocardial infarction: Secondary | ICD-10-CM | POA: Insufficient documentation

## 2014-11-03 DIAGNOSIS — D071 Carcinoma in situ of vulva: Secondary | ICD-10-CM

## 2014-11-03 DIAGNOSIS — M1990 Unspecified osteoarthritis, unspecified site: Secondary | ICD-10-CM | POA: Insufficient documentation

## 2014-11-03 DIAGNOSIS — K219 Gastro-esophageal reflux disease without esophagitis: Secondary | ICD-10-CM | POA: Insufficient documentation

## 2014-11-03 DIAGNOSIS — Z90711 Acquired absence of uterus with remaining cervical stump: Secondary | ICD-10-CM | POA: Insufficient documentation

## 2014-11-03 DIAGNOSIS — J449 Chronic obstructive pulmonary disease, unspecified: Secondary | ICD-10-CM | POA: Diagnosis not present

## 2014-11-03 DIAGNOSIS — Z79899 Other long term (current) drug therapy: Secondary | ICD-10-CM | POA: Diagnosis not present

## 2014-11-03 DIAGNOSIS — I2510 Atherosclerotic heart disease of native coronary artery without angina pectoris: Secondary | ICD-10-CM | POA: Insufficient documentation

## 2014-11-03 HISTORY — PX: CO2 LASER APPLICATION: SHX5778

## 2014-11-03 HISTORY — DX: Gastro-esophageal reflux disease without esophagitis: K21.9

## 2014-11-03 HISTORY — PX: VULVAR LESION REMOVAL: SHX5391

## 2014-11-03 HISTORY — DX: Noninfective gastroenteritis and colitis, unspecified: K52.9

## 2014-11-03 HISTORY — DX: Carcinoma in situ of vulva: D07.1

## 2014-11-03 HISTORY — DX: Personal history of other diseases of the respiratory system: Z87.09

## 2014-11-03 LAB — POCT I-STAT, CHEM 8
BUN: 14 mg/dL (ref 6–23)
CALCIUM ION: 1.37 mmol/L — AB (ref 1.13–1.30)
CHLORIDE: 100 mmol/L (ref 96–112)
Creatinine, Ser: 0.9 mg/dL (ref 0.50–1.10)
Glucose, Bld: 92 mg/dL (ref 70–99)
HCT: 35 % — ABNORMAL LOW (ref 36.0–46.0)
HEMOGLOBIN: 11.9 g/dL — AB (ref 12.0–15.0)
Potassium: 4 mmol/L (ref 3.5–5.1)
Sodium: 134 mmol/L — ABNORMAL LOW (ref 135–145)
TCO2: 19 mmol/L (ref 0–100)

## 2014-11-03 SURGERY — VULVAR LESION
Anesthesia: General | Site: Vulva

## 2014-11-03 MED ORDER — HYDROCODONE-ACETAMINOPHEN 5-500 MG PO TABS: 1.0000 | ORAL_TABLET | Freq: Four times a day (QID) | ORAL | Status: DC | PRN

## 2014-11-03 MED ORDER — SILVER SULFADIAZINE 1 % EX CREA
TOPICAL_CREAM | CUTANEOUS | Status: DC | PRN
  Administered 2014-11-03: 1 via TOPICAL

## 2014-11-03 MED ORDER — MIDAZOLAM HCL 2 MG/2ML IJ SOLN
INTRAMUSCULAR | Status: AC
  Filled 2014-11-03: qty 2

## 2014-11-03 MED ORDER — IBUPROFEN 800 MG PO TABS: 800.0000 mg | ORAL_TABLET | Freq: Three times a day (TID) | ORAL | Status: DC | PRN

## 2014-11-03 MED ORDER — STERILE WATER FOR IRRIGATION IR SOLN
Status: DC | PRN
  Administered 2014-11-03: 500 mL

## 2014-11-03 MED ORDER — PROPOFOL 10 MG/ML IV BOLUS
INTRAVENOUS | Status: DC | PRN
  Administered 2014-11-03: 130 mg via INTRAVENOUS
  Administered 2014-11-03: 20 mg via INTRAVENOUS

## 2014-11-03 MED ORDER — DEXAMETHASONE SODIUM PHOSPHATE 4 MG/ML IJ SOLN
INTRAMUSCULAR | Status: DC | PRN
  Administered 2014-11-03: 8 mg via INTRAVENOUS

## 2014-11-03 MED ORDER — LIDOCAINE HCL 1 % IJ SOLN
INTRAMUSCULAR | Status: DC | PRN
  Administered 2014-11-03: 14 mL

## 2014-11-03 MED ORDER — FENTANYL CITRATE 0.05 MG/ML IJ SOLN
25.0000 ug | INTRAMUSCULAR | Status: DC | PRN
  Filled 2014-11-03: qty 1

## 2014-11-03 MED ORDER — HYDROCODONE-ACETAMINOPHEN 5-325 MG PO TABS
1.0000 | ORAL_TABLET | Freq: Once | ORAL | Status: AC
  Administered 2014-11-03: 1 via ORAL
  Filled 2014-11-03: qty 1

## 2014-11-03 MED ORDER — LACTATED RINGERS IV SOLN
INTRAVENOUS | Status: DC
  Filled 2014-11-03: qty 1000

## 2014-11-03 MED ORDER — HYDROCODONE-ACETAMINOPHEN 5-325 MG PO TABS
ORAL_TABLET | ORAL | Status: AC
  Filled 2014-11-03: qty 1

## 2014-11-03 MED ORDER — SODIUM CHLORIDE 0.9 % IR SOLN
Status: DC | PRN
  Administered 2014-11-03: 500 mL

## 2014-11-03 MED ORDER — FENTANYL CITRATE 0.05 MG/ML IJ SOLN
INTRAMUSCULAR | Status: AC
  Filled 2014-11-03: qty 4

## 2014-11-03 MED ORDER — LACTATED RINGERS IV SOLN
INTRAVENOUS | Status: DC
  Administered 2014-11-03: 07:00:00 via INTRAVENOUS
  Filled 2014-11-03: qty 1000

## 2014-11-03 MED ORDER — ACETIC ACID 5 % SOLN
Status: DC | PRN
  Administered 2014-11-03: 1 via TOPICAL

## 2014-11-03 MED ORDER — SILVER SULFADIAZINE 1 % EX CREA: 1.0000 "application " | TOPICAL_CREAM | CUTANEOUS | Status: DC | PRN

## 2014-11-03 MED ORDER — LIDOCAINE HCL (CARDIAC) 20 MG/ML IV SOLN
INTRAVENOUS | Status: DC | PRN
  Administered 2014-11-03: 50 mg via INTRAVENOUS

## 2014-11-03 MED ORDER — FENTANYL CITRATE 0.05 MG/ML IJ SOLN
INTRAMUSCULAR | Status: DC | PRN
  Administered 2014-11-03 (×2): 50 ug via INTRAVENOUS

## 2014-11-03 MED ORDER — ONDANSETRON HCL 4 MG/2ML IJ SOLN
INTRAMUSCULAR | Status: DC | PRN
  Administered 2014-11-03: 4 mg via INTRAVENOUS

## 2014-11-03 SURGICAL SUPPLY — 32 items
BLADE SURG 15 STRL LF DISP TIS (BLADE) ×1 IMPLANT
BLADE SURG 15 STRL SS (BLADE) ×1
BNDG GAUZE ELAST 4 BULKY (GAUZE/BANDAGES/DRESSINGS) ×2 IMPLANT
BRIEF STRETCH FOR OB PAD LRG (UNDERPADS AND DIAPERS) ×2 IMPLANT
CANISTER SUCTION 1200CC (MISCELLANEOUS) ×2 IMPLANT
CATH SILICONE 16FRX5CC (CATHETERS) ×2 IMPLANT
COVER TABLE BACK 60X90 (DRAPES) ×2 IMPLANT
DEPRESSOR TONGUE BLADE STERILE (MISCELLANEOUS) ×2 IMPLANT
DRAPE LG THREE QUARTER DISP (DRAPES) IMPLANT
DRAPE UNDERBUTTOCKS STRL (DRAPE) ×2 IMPLANT
ELECT REM PT RETURN 9FT ADLT (ELECTROSURGICAL) ×2
ELECTRODE REM PT RTRN 9FT ADLT (ELECTROSURGICAL) ×1 IMPLANT
GLOVE INDICATOR 6.5 STRL GRN (GLOVE) ×2 IMPLANT
GLOVE INDICATOR 7.5 STRL GRN (GLOVE) ×2 IMPLANT
GLOVE SURG SS PI 6.0 STRL IVOR (GLOVE) ×2 IMPLANT
GLOVE SURG SS PI 6.5 STRL IVOR (GLOVE) ×2 IMPLANT
GOWN STRL REUS W/ TWL LRG LVL3 (GOWN DISPOSABLE) ×2 IMPLANT
GOWN STRL REUS W/TWL LRG LVL3 (GOWN DISPOSABLE) ×4
LEGGING LITHOTOMY PAIR STRL (DRAPES) ×2 IMPLANT
NS IRRIG 500ML POUR BTL (IV SOLUTION) ×2 IMPLANT
PACK BASIN DAY SURGERY FS (CUSTOM PROCEDURE TRAY) ×2 IMPLANT
PAD PREP 24X48 CUFFED NSTRL (MISCELLANEOUS) ×2 IMPLANT
PENCIL BUTTON HOLSTER BLD 10FT (ELECTRODE) ×2 IMPLANT
SCOPETTES 8  STERILE (MISCELLANEOUS) ×1
SCOPETTES 8 STERILE (MISCELLANEOUS) ×1 IMPLANT
SYR CONTROL 10ML LL (SYRINGE) ×2 IMPLANT
TOWEL OR 17X24 6PK STRL BLUE (TOWEL DISPOSABLE) ×4 IMPLANT
TRAY DSU PREP LF (CUSTOM PROCEDURE TRAY) ×2 IMPLANT
TUBE CONNECTING 12X1/4 (SUCTIONS) ×2 IMPLANT
VACUUM HOSE/TUBING 7/8INX6FT (MISCELLANEOUS) ×2 IMPLANT
WATER STERILE IRR 500ML POUR (IV SOLUTION) ×2 IMPLANT
YANKAUER SUCT BULB TIP NO VENT (SUCTIONS) ×2 IMPLANT

## 2014-11-03 NOTE — Anesthesia Procedure Notes (Signed)
Procedure Name: LMA Insertion Date/Time: 11/03/2014 8:25 AM Performed by: Mechele Claude Pre-anesthesia Checklist: Patient identified, Emergency Drugs available, Suction available and Patient being monitored Patient Re-evaluated:Patient Re-evaluated prior to inductionOxygen Delivery Method: Circle System Utilized Preoxygenation: Pre-oxygenation with 100% oxygen Intubation Type: IV induction Ventilation: Mask ventilation without difficulty LMA: LMA inserted LMA Size: 3.0 Number of attempts: 1 Airway Equipment and Method: bite block Placement Confirmation: positive ETCO2 Tube secured with: Tape Dental Injury: Teeth and Oropharynx as per pre-operative assessment

## 2014-11-03 NOTE — Discharge Instructions (Signed)
Vulvar laser, Care After The vulva is the external female genitalia, outside and around the vagina and pubic bone. It consists of:  The skin on, and in front of, the pubic bone.  The clitoris.  The labia majora (large lips) on the outside of the vagina.  The labia minora (small lips) around the opening of the vagina.  The opening and the skin in and around the vagina.  These discharge instructions provide you with general information on caring for yourself after you leave the hospital. It is also important that you know the warning signs of complications, so that you can seek treatment. Please read the instructions outlined below and refer to this sheet in the next few weeks. Your caregiver may also give you specific information and medicines. If you have any questions or complications after discharge, please call your caregiver. ACTIVITY  Rest as much as possible the first two weeks after discharge.  Arrange to have help from family or others with your daily activities when you go home.  Avoid heavy lifting (more than 5 pounds), pushing, or pulling.  If you feel tired, balance your activity with rest periods.  Follow your caregiver's instruction about climbing stairs and driving a car.  Increase activity gradually.  Do not exercise until you have permission from your caregiver. LEG AND FOOT CARE If your doctor has removed lymph nodes from your groin area, there may be an increase in swelling of your legs and feet. You can help prevent swelling by doing the following:  Elevate your legs while sitting or lying down.  If your caregiver has ordered special stockings, wear them according to instructions.  Avoid standing in one place for long periods of time.  Call the physical therapy department if you have any questions about swelling or treatment for swelling.  Avoid salt in your diet. It can cause fluid retention and swelling.  Do not cross your legs, especially when  sitting. NUTRITION  You may resume your normal diet.  Drink 6 to 8 glasses of fluids a day.  Eat a healthy, balanced diet including portions of food from the meat (protein), milk, fruit, vegetable, and bread groups.  Your caregiver may recommend you take a multivitamin with iron. ELIMINATION  You may notice that your stream of urine is at a different angle, and may tend to spray. Using a plastic funnel may help to decrease urine spray.  If constipation occurs, drink more liquids, and add more fruits, vegetables, and bran to your diet. You may take a mild laxative, such as Milk of Magnesia, Metamucil, or a stool softener such as Colace, with permission from your caregiver. HYGIENE  You may shower and wash your hair.  Check with your caregiver about tub baths.  Do not add any bath oils or chemicals to your bath water, after you have permission to take baths.  While passing urine, pour water from a bottle or spray over your vulva to dilute the urine as it passes the incision (this will decrease burning and discomfort).  Clean yourself well after moving your bowels.  After urinating, do not wipe. Dap or pat dry with toilet paper or a dry cleath soft cloth.  A sitz bath will help keep your perineal area clean, reduce swelling, and provide comfort.  Avoid wearing underpants for the first 2 weeks and wear loose skirts to allow circulation of air around the incision  You should apply the silvadine cream (or Desitin (zinc oxide) diaper cream) to the area  after toiletting and cleaning. This acts as a barrier for comfort.  HOME CARE INSTRUCTIONS   Apply a soft ice pack (or frozen bag of peas) to your perineum (vulva) every hour in the first 48 hours after surgery. This will reduce swelling.  Avoid activities that involve a lot of friction between your legs.  Avoid wearing pants or underpants in the 1st 2 weeks (skirts are preferable).  Take your temperature twice a day and record it,  especially if you feel feverish or have chills.  Follow your caregiver's instructions about medicines, activity, and follow-up appointments after surgery.  Do not drink alcohol while taking pain medicine.  You may take over-the-counter medicine for pain, recommended by your caregiver.  If your pain is not relieved with medicine, call your caregiver.  Do not take aspirin because it can cause bleeding.  Do not douche or use tampons (use a nonperfumed sanitary pad).  Do not have sexual intercourse until your caregiver gives you permission (typically 6 weeks postoperatively). Hugging, kissing, and playful sexual activity is fine with your caregiver's permission.  Warm sitz baths, with your caregiver's permission, are helpful to control swelling and discomfort.  Take showers instead of baths, until your caregiver gives you permission to take baths.  You may take a mild medicine for constipation, recommended by your caregiver. Bran foods and drinking a lot of fluids will help with constipation.  Make sure your family understands everything about your operation and recovery. SEEK MEDICAL CARE IF:   You notice swelling and redness around the wound area.  You notice a foul smell coming from the wound or on the surgical dressing.  You notice the wound is separating.  You have painful or bloody urination.  You develop nausea and vomiting.  You develop diarrhea.  You develop a rash.  You have a reaction or allergy from the medicine.  You feel dizzy or light-headed.  You need stronger pain medicine. SEEK IMMEDIATE MEDICAL CARE IF:   You develop a temperature of 102 F (38.9 C) or higher.  You pass out.  You develop leg or chest pain.  You develop abdominal pain.  You develop shortness of breath.  You develop bleeding from the wound area.  You see pus in the wound area. MAKE SURE YOU:   Understand these instructions.  Will watch your condition.  Will get help right  away if you are not doing well or get worse. Document Released: 04/01/2004 Document Revised: 01/02/2014 Document Reviewed: 07/20/2009 [facility] Patient Information 2015 [place], [place]. This information is not intended to replace advice given to you by your health care provider. Make sure you discuss any questions you have with your health care provider. Post Anesthesia Home Care Instructions  Activity: Get plenty of rest for the remainder of the day. A responsible adult should stay with you for 24 hours following the procedure.  For the next 24 hours, DO NOT: -Drive a car -Paediatric nurse -Drink alcoholic beverages -Take any medication unless instructed by your physician -Make any legal decisions or sign important papers.  Meals: Start with liquid foods such as gelatin or soup. Progress to regular foods as tolerated. Avoid greasy, spicy, heavy foods. If nausea and/or vomiting occur, drink only clear liquids until the nausea and/or vomiting subsides. Call your physician if vomiting continues.  Special Instructions/Symptoms: Your throat may feel dry or sore from the anesthesia or the breathing tube placed in your throat during surgery. If this causes discomfort, gargle with warm salt water. The  discomfort should disappear within 24 hours.

## 2014-11-03 NOTE — Anesthesia Postprocedure Evaluation (Signed)
  Anesthesia Post-op Note  Patient: Regina Ortiz  Procedure(s) Performed: Procedure(s) (LRB): CO2 LASER OF VULVAR  (N/A) CO2 LASER APPLICATION (N/A)  Patient Location: PACU  Anesthesia Type: General  Level of Consciousness: awake and alert   Airway and Oxygen Therapy: Patient Spontanous Breathing  Post-op Pain: mild  Post-op Assessment: Post-op Vital signs reviewed, Patient's Cardiovascular Status Stable, Respiratory Function Stable, Patent Airway and No signs of Nausea or vomiting  Last Vitals:  Filed Vitals:   11/03/14 1015  BP: 145/68  Pulse: 77  Temp:   Resp: 18    Post-op Vital Signs: stable   Complications: No apparent anesthesia complications

## 2014-11-03 NOTE — H&P (View-Only) (Signed)
Office Visit Note: [facility] -Oncology  CC: VIN surveillance  Assessment/Plan:  This is a 69 y.o. with recurrent multifocal vulvar dysplasia. S/P  CO2 laser ablation of the external genitalia on 06/22/2012.  Will call with the results of the pap and labial biopsy F/U in 12  months Advised to reduce tobacco use   HPI:  HISTORY OF PRESENT ILLNESS: This is a 69 y.o. with multifocal lower genital tract dysplasia, both VIN and VAIN. History is notable for hysterectomy for carcinoma in situ in 1997. She subsequently underwent  upper vaginal colpectomies in 1983 and 2000 for vaginal dysplasia. In June 2011, a vulva biopsy demonstrated VIN III. In August 2011, she  underwent wide local excision of a right-sided inferior labia majora lesion and CO2 ablation of the right labia minora. She denies any  vulvar pruritus or discharge.   Pap 2010, 2011, 2012  2013 wnl.  Patient seen 01/2012.  Bx c/w VIN III.  Patient given trial of Aldara.  She stated that it has been difficult to apply Aldara to the external genitalia at the site of the lesions and requested CO2 laser treatment.  She underwent Co2 laser ablation of the external genitalia on 06/22/2012.  Pap 03/2013 wnl  At the prior visit AIN was appreciated.  Patient underwent excision of an external and internal hemorrhoid with AIN 04/2013.   REVIEW OF SYSTEMS: No vulva pruritus, adenopathy, cough, shortness of  breath; vulva or vaginal bleeding. No chest pain, nausea, vomiting, diarrhea, constipation,.  No urinary incontinence, vaginal bleeding or discharge.  Review of other systems non contributory.   Past Surgical Hx:  Past Surgical History  Procedure Laterality Date  . Wide local excision of labia majora  04-04-2010    Right-sided lesion, CO2 ablation of right labia minora  . Excision vulvar lesions  07/2012  . Appendectomy  1992  . Repair fistula in ano/  i & d perirectal abscess  10-26-2002  DR Johnathan Hausen  . Cardiovascular stress test   06-23-2012  DR Jenkins [place]    NORMAL LEXISCAN MYOVIEW/ EF 83%  . Transthoracic echocardiogram  06-23-2012    NORMAL LVSF/ EF 55-60%  . Co2 laser application  20/06/711    Procedure: CO2 LASER APPLICATION;  Surgeon: Janie Morning, MD PHD;  Location: [facility];  Service: Gynecology;  Laterality: N/A;  Laser Vaporization  . Vaginal hysterectomy  1997    partial  . Hemorrhoid surgery N/A 04/15/2013    Procedure: Excision of external and internal  hemorrhoid with AIN, Exam under anesthesia;  Surgeon: Odis Hollingshead, MD;  Location: [facility];  Service: General;  Laterality: N/A;    Past Medical Hx:  Past Medical History  Diagnosis Date  . Dysplasia     Multifocal genital, VIN and VAIN  . Hypertension   . Osteoporosis   . Bruises easily   . Nocturia   . Smokers' cough   . Depression   . Anxiety   . Short of breath on exertion   . LBBB (left bundle branch block) W/ PROLONGED PR    CARDIOLOGSIT-  DR Johnsie Cancel-   LOV IN EPIC  . Hemorrhoid   . Macrocytosis   . Chronic hyponatremia   . Venous insufficiency of leg     , Edema  . Chronic hyponatremia   . Seronegative rheumatoid arthritis   . Mild malnutrition 07/07/2013  . Chronic bronchitis 07/07/2013  . Tobacco abuse 07/07/2013    Family Hx:  Family History  Problem Relation Age of  Onset  . Diabetes Mother   . Hypertension Father   . Lymphoma Son   . Cancer Son     lymphoblastic lymphoma     Vitals:  Blood pressure 155/61, pulse 94, temperature 98.4 F (36.9 C), temperature source Oral, resp. rate 18, height 5\' 4"  (1.626 m), weight 105 lb 12.8 oz (47.991 kg), SpO2 99 %.  Physical Exam: WD thin female in NAD LN:  No cervical supraclavicular or inguinal adenopathy PELVIC: External genitalia with extensive scaring.  Acetic acid applied.  Aceto white changes noted on the left labia minora.  Are prepped anextethized and a 88mm punch biopsy was collected.  Hemostasis achieved with pressure. Vagina atrophic, no  masses.Pap collected. RECTAL:  No perianal lesions noted EXT:  No CCE    Janie Morning, MD., PhD. 10/12/2014, 2:42 PM   .

## 2014-11-03 NOTE — Interval H&P Note (Signed)
History and Physical Interval Note:  11/03/2014 7:31 AM  Regina Ortiz  has presented today for surgery, with the diagnosis of vulvar dyplasia (VIN3)  The various methods of treatment have been discussed with the patient and family. After consideration of risks, benefits and other options for treatment, the patient has consented to  Procedure(s): CO2 LASER OF VULVAR  (N/A) CO2 LASER APPLICATION (N/A) as a surgical intervention .  The patient's history has been reviewed, patient examined, no change in status, stable for surgery.  I have reviewed the patient's chart and labs.  Questions were answered to the patient's satisfaction.     Donaciano Eva

## 2014-11-03 NOTE — Anesthesia Preprocedure Evaluation (Signed)
Anesthesia Evaluation  Patient identified by MRN, date of birth, ID band Patient awake    Reviewed: Allergy & Precautions, H&P , NPO status , Patient's Chart, lab work & pertinent test results  Airway Mallampati: III  TM Distance: >3 FB Neck ROM: Full    Dental  (+) Dental Advisory Given, Edentulous Upper   Pulmonary shortness of breath and with exertion, COPDCurrent Smoker,  Chronic bronchitis breath sounds clear to auscultation  Pulmonary exam normal       Cardiovascular hypertension, Pt. on medications - CAD and - Past MI Rhythm:Regular Rate:Normal  LBBB   Neuro/Psych PSYCHIATRIC DISORDERS Anxiety Depression    GI/Hepatic negative GI ROS, Neg liver ROS, GERD-  Medicated and Controlled,  Endo/Other  negative endocrine ROS  Renal/GU ARFRenal disease     Musculoskeletal  (+) Arthritis -, Rheumatoid disorders,    Abdominal   Peds  Hematology negative hematology ROS (+)   Anesthesia Other Findings   Reproductive/Obstetrics negative OB ROS                             Anesthesia Physical Anesthesia Plan  ASA: III  Anesthesia Plan: General   Post-op Pain Management:    Induction: Intravenous  Airway Management Planned: LMA  Additional Equipment:   Intra-op Plan:   Post-operative Plan:   Informed Consent:   Plan Discussed with: Surgeon  Anesthesia Plan Comments:         Anesthesia Quick Evaluation

## 2014-11-03 NOTE — Op Note (Signed)
OPERATIVE NOTE   PATIENT: Regina Ortiz DATE: 11/03/14   Preop Diagnosis: VIN3 of labia minora  Postoperative Diagnosis: same  Surgery: Partial simple CO2 laser ablation of the left vulva and clitoris  Surgeons:  Donaciano Eva, MD Assistant: none  Anesthesia: General   Estimated blood loss: <20 ml  IVF:  100 ml   Urine output: 803 ml   Complications: None   Pathology: none   Operative findings: acetowhite changes to anterior left labia minora and left aspect of clitoral hood.  Procedure: The patient was identified in the preoperative holding area. Informed consent was signed on the chart. Patient was seen history was reviewed and exam was performed.   The patient was then taken to the operating room and placed in the supine position with SCD hose on. General anesthesia was then induced without difficulty. She was then placed in the dorsolithotomy position. The perineum was prepped with Betadine. The vagina was prepped with Betadine. The patient was then draped after the prep was dried. A Foley catheter was inserted into the bladder under sterile conditions.  Timeout was performed the patient, procedure, antibiotic, allergy, and length of procedure. 5% acetic acid solution was applied to the perineum. The vulvar tissues were inspected for areas of acetowhite changes or leukoplakia. The lesion was identified and the marking pen was used to circumscribe the area with appropriate surgical margins. The subcuticular tissues were infiltrated with 1% lidocaine. The CO2 laser was set to 12 watts continuous. All personnel and the patient were fitted with laser safe eye wear and mask. The laser was tested for accuracy and confirmed. The patient was draped with wet towels to demarcate the surgical field. The previously outlined area of vulvar skin was ablated with the CO2 laser to the 2nd level. The eschar was removed to confirm adequacy of depth of ablation. Hemostasis was  observed. The perineum was again irrigated.   All instrument, suture, laparotomy, Ray-Tec, and needle counts were correct x2. The patient tolerated the procedure well and was taken recovery room in stable condition. This is [name] dictating an operative note on Doratha Mcswain.  Donaciano Eva, MD

## 2014-11-03 NOTE — Transfer of Care (Signed)
Last Vitals:  Filed Vitals:   11/03/14 0708  BP: 154/62  Pulse: 77  Temp: 36.8 C  Resp: 16    Immediate Anesthesia Transfer of Care Note  Patient: Regina Ortiz  Procedure(s) Performed: Procedure(s) (LRB): CO2 LASER OF VULVAR  (N/A) CO2 LASER APPLICATION (N/A)  Patient Location: PACU  Anesthesia Type: General  Level of Consciousness: awake, alert  and oriented  Airway & Oxygen Therapy: Patient Spontanous Breathing and Patient connected to face mask oxygen  Post-op Assessment: Report given to PACU RN and Post -op Vital signs reviewed and stable  Post vital signs: Reviewed and stable  Complications: No apparent anesthesia complications

## 2014-11-06 ENCOUNTER — Encounter (HOSPITAL_BASED_OUTPATIENT_CLINIC_OR_DEPARTMENT_OTHER): Payer: Self-pay | Admitting: Gynecologic Oncology

## 2014-11-24 ENCOUNTER — Ambulatory Visit: Payer: Medicare Other | Admitting: Gynecologic Oncology

## 2014-12-04 ENCOUNTER — Ambulatory Visit: Payer: Medicare Other | Attending: Gynecologic Oncology | Admitting: Gynecologic Oncology

## 2014-12-04 ENCOUNTER — Encounter: Payer: Self-pay | Admitting: Gynecologic Oncology

## 2014-12-04 VITALS — BP 157/65 | HR 89 | Temp 98.7°F | Resp 16 | Ht 64.0 in | Wt 102.9 lb

## 2014-12-04 DIAGNOSIS — D071 Carcinoma in situ of vulva: Secondary | ICD-10-CM | POA: Insufficient documentation

## 2014-12-04 DIAGNOSIS — Z90710 Acquired absence of both cervix and uterus: Secondary | ICD-10-CM | POA: Insufficient documentation

## 2014-12-04 DIAGNOSIS — Z4889 Encounter for other specified surgical aftercare: Secondary | ICD-10-CM | POA: Insufficient documentation

## 2014-12-04 NOTE — Progress Notes (Signed)
POSTOPERATIVE FOLLOWUP   Chief complaint: VIN III  Assessment:    69 y.o. year old with recurrent VINIII.   S/p vulvar and clitoral laser on 11/03/14.   Plan: 1) I discussed the risk for recurrence. We discussed symptoms concerning for recurrence and I instructed that she should contact us if this occurs. She was given the opportunity to ask questions, which were answered to her satisfaction, and she is agreement with the above mentioned plan of care.  2)  Return to clinic in 3 months for inspection of vulvar for recurrence. 6 monthly visits thereafter.   HPI:  Regina Ortiz is a 69 y.o. year old who underwent a CO2 laser of the clitoral hood and left labia minora on 11/03/14 for VIN 3 without complications.  Her postoperative course was uncomplicated.  She has multifocal lower genital tract dysplasia, both VIN and VAIN. History is notable for hysterectomy for carcinoma in situ in 1997. She subsequently underwent upper vaginal colpectomies in 1983 and 2000 for vaginal dysplasia. In June 2011, a vulva biopsy demonstrated VIN III. In August 2011, she underwent wide local excision of a right-sided inferior labia majora lesion and CO2 ablation of the right labia minora. She denies any vulvar pruritus or discharge.   Pap 2010, 2011, 2012 2013 wnl. Patient seen 01/2012. Bx c/w VIN III. Patient given trial of Aldara. She stated that it has been difficult to apply Aldara to the external genitalia at the site of the lesions and requested CO2 laser treatment. She underwent Co2 laser ablation of the external genitalia on 06/22/2012.  Pap 03/2013 wnl  Patient underwent excision of an external and internal hemorrhoid with AIN 04/2013.  Biopsy of left labial lesion on 10/12/14 revealed VINIII.  CO2 laser of clitoral hood and left labia minora (anterior) on 11/03/14.  She is seen today for a postoperative check and to discuss her pathology results and ongoing plan.  Since discharge from the hospital, she  is feeling well.  She has improving appetite, normal bowel and bladder function, and pain controlled with minimal PO medication. She has no other complaints today. She is still using silvadine cream. She still has some discomfort.    Review of systems: Constitutional:  She has no weight gain or weight loss. She has no fever or chills. Eyes: No blurred vision Ears, Nose, Mouth, Throat: No dizziness, headaches or changes in hearing. No mouth sores. Cardiovascular: No chest pain, palpitations or edema. Respiratory:  No shortness of breath, wheezing or cough Gastrointestinal: She has normal bowel movements without diarrhea or constipation. She denies any nausea or vomiting. She denies blood in her stool or heart burn. Genitourinary:  She denies pelvic pain, pelvic pressure or changes in her urinary function. She has no hematuria, dysuria, or incontinence. She has no irregular vaginal bleeding or vaginal discharge Musculoskeletal: Denies muscle weakness or joint pains.  Skin:  She has no skin changes, rashes or itching Neurological:  Denies dizziness or headaches. No neuropathy, no numbness or tingling. Psychiatric:  She denies depression or anxiety. Hematologic/Lymphatic:   No easy bruising or bleeding   Physical Exam: Blood pressure 157/65, pulse 89, temperature 98.7 F (37.1 C), temperature source Oral, resp. rate 16, height 5\' 4"  (1.626 m), weight 102 lb 14.4 oz (46.675 kg). General: Well dressed, well nourished in no apparent distress.   HEENT:  Normocephalic and atraumatic, no lesions.  Extraocular muscles intact. Sclerae anicteric. Pupils equal, round, reactive. No mouth sores or ulcers. Thyroid is normal size, not nodular,  midline. Skin:  No lesions or rashes. Breasts:  deferred Lungs:  deferred Cardiovascular: deferred Abdomen:  deferred Genitourinary: Skin of labia is healing. Ulcerated area at anterior part of left labia minora. No residual dysplasia present. Extremities: No  cyanosis, clubbing or edema.  No calf tenderness or erythema. No palpable cords. Psychiatric: Mood and affect are appropriate. Neurological: Awake, alert and oriented x 3. Sensation is intact, no neuropathy.  Musculoskeletal: No pain, normal strength and range of motion.   Donaciano Eva, MD

## 2014-12-04 NOTE — Patient Instructions (Signed)
Followup with Dr. Denman George in 3 months. Please call us sooner with any questions or concerns.

## 2015-02-28 ENCOUNTER — Other Ambulatory Visit: Payer: Self-pay | Admitting: Family Medicine

## 2015-02-28 DIAGNOSIS — R0600 Dyspnea, unspecified: Secondary | ICD-10-CM

## 2015-03-01 ENCOUNTER — Ambulatory Visit (INDEPENDENT_AMBULATORY_CARE_PROVIDER_SITE_OTHER): Payer: Medicare Other | Admitting: Internal Medicine

## 2015-03-01 DIAGNOSIS — R0600 Dyspnea, unspecified: Secondary | ICD-10-CM

## 2015-03-01 LAB — PULMONARY FUNCTION TEST
DL/VA % PRED: 66 %
DL/VA: 3.17 ml/min/mmHg/L
DLCO unc % pred: 55 %
DLCO unc: 13.44 ml/min/mmHg
FEF 25-75 PRE: 0.91 L/s
FEF 25-75 Post: 0.9 L/sec
FEF2575-%CHANGE-POST: -1 %
FEF2575-%PRED-POST: 47 %
FEF2575-%Pred-Pre: 47 %
FEV1-%Change-Post: 3 %
FEV1-%PRED-PRE: 76 %
FEV1-%Pred-Post: 78 %
FEV1-POST: 1.78 L
FEV1-Pre: 1.73 L
FEV1FVC-%Change-Post: 2 %
FEV1FVC-%Pred-Pre: 82 %
FEV6-%CHANGE-POST: 1 %
FEV6-%Pred-Post: 95 %
FEV6-%Pred-Pre: 94 %
FEV6-Post: 2.72 L
FEV6-Pre: 2.69 L
FEV6FVC-%CHANGE-POST: 0 %
FEV6FVC-%PRED-POST: 103 %
FEV6FVC-%PRED-PRE: 102 %
FVC-%CHANGE-POST: 0 %
FVC-%PRED-POST: 92 %
FVC-%PRED-PRE: 91 %
FVC-POST: 2.77 L
FVC-PRE: 2.75 L
PRE FEV1/FVC RATIO: 63 %
PRE FEV6/FVC RATIO: 98 %
Post FEV1/FVC ratio: 64 %
Post FEV6/FVC ratio: 98 %
RV % pred: 76 %
RV: 1.66 L
TLC % pred: 90 %
TLC: 4.58 L

## 2015-03-01 NOTE — Progress Notes (Signed)
PFT done today. 

## 2015-03-08 ENCOUNTER — Encounter: Payer: Self-pay | Admitting: Gynecologic Oncology

## 2015-03-08 ENCOUNTER — Ambulatory Visit: Payer: Medicare Other | Attending: Gynecologic Oncology | Admitting: Gynecologic Oncology

## 2015-03-08 VITALS — BP 151/70 | HR 82 | Temp 98.5°F | Resp 18 | Ht 64.0 in | Wt 109.8 lb

## 2015-03-08 DIAGNOSIS — D071 Carcinoma in situ of vulva: Secondary | ICD-10-CM | POA: Diagnosis not present

## 2015-03-08 NOTE — Patient Instructions (Signed)
We will contact you with the results of your biopsy from today.  If it is low grade dysplasia, we will follow it.  If it is high grade dysplasia, we will arrange for a wide local excision of the area.  Follow up will be scheduled based on biopsy results.  Please call for any questions or concerns.  You may have some spotting or see a grayish discharge on your panty liner due to the medication we used to stop the bleeding.

## 2015-03-08 NOTE — Progress Notes (Signed)
POSTOPERATIVE FOLLOWUP   Chief complaint: VIN III  Assessment:    69 y.o. 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   S/p vulvar and clitoral laser on 11/03/14. Recurrence on right labia majora  Plan: 1) Followup biopsy result: if VIN III - recommend wide local excison of right vulva. If VIN 1 recommend close followup in 6 months.    HPI:  Regina Ortiz is a 69 y.o. year old who underwent a CO2 laser of the clitoral hood and left labia minora on 11/03/14 for VIN 3 without complications.  Her postoperative course was uncomplicated.  She has multifocal lower genital tract dysplasia, both VIN and VAIN. History is notable for hysterectomy for carcinoma in situ in 1997. She subsequently underwent upper vaginal colpectomies in 1983 and 2000 for vaginal dysplasia. In June 2011, a vulva biopsy demonstrated VIN III. In August 2011, she underwent wide local excision of a right-sided inferior labia majora lesion and CO2 ablation of the right labia minora. She denies any vulvar pruritus or discharge.   Pap 2010, 2011, 2012 2013 wnl. Patient seen 01/2012. Bx c/w VIN III. Patient given trial of Aldara. She stated that it has been difficult to apply Aldara to the external genitalia at the site of the lesions and requested CO2 laser treatment. She underwent Co2 laser ablation of the external genitalia on 06/22/2012.  Pap 03/2013 wnl  Patient underwent excision of an external and internal hemorrhoid with AIN 04/2013.  Biopsy of left labial lesion on 10/12/14 revealed VINIII.  CO2 laser of clitoral hood and left labia minora (anterior) on 11/03/14.  Interval Hx:   She is seen today for a routine check. She denies appreciating new symptomatic lesions.  She has been started on humira for her RA and this is also significantly helping her colitis symptoms.   Review of systems: Constitutional:  She has no weight gain or weight loss. She has no fever or chills. Eyes:  No blurred vision Ears, Nose, Mouth, Throat: No dizziness, headaches or changes in hearing. No mouth sores. Cardiovascular: No chest pain, palpitations or edema. Respiratory:  No shortness of breath, wheezing or cough Gastrointestinal: She has normal bowel movements without diarrhea or constipation. She denies any nausea or vomiting. She denies blood in her stool or heart burn. Genitourinary:  She denies pelvic pain, pelvic pressure or changes in her urinary function. She has no hematuria, dysuria, or incontinence. She has no irregular vaginal bleeding or vaginal discharge Musculoskeletal: Denies muscle weakness or joint pains.  Skin:  She has no skin changes, rashes or itching Neurological:  Denies dizziness or headaches. No neuropathy, no numbness or tingling. Psychiatric:  She denies depression or anxiety. Hematologic/Lymphatic:   No easy bruising or bleeding   Physical Exam: Blood pressure 151/70, pulse 82, temperature 98.5 F (36.9 C), temperature source Oral, resp. rate 18, height 5\' 4"  (1.626 m), weight 109 lb 12.8 oz (49.805 kg), SpO2 98 %. General: Well dressed, well nourished in no apparent distress.   HEENT:  Normocephalic and atraumatic, no lesions.  Extraocular muscles intact. Sclerae anicteric. Pupils equal, round, reactive. No mouth sores or ulcers. Thyroid is normal size, not nodular, midline. Skin:  No lesions or rashes. Breasts:  deferred Lungs:  deferred Cardiovascular: deferred Abdomen:  deferred Genitourinary: acetic acid 4% applied. 1cm area of acetowhite changes noted on the mid/anterior right labia majora inner lip. Also some slightly verucous to palpate lesions more inferiorally. Anterior lesion biopsied (see below). Extremities: No cyanosis,  clubbing or edema.  No calf tenderness or erythema. No palpable cords. Psychiatric: Mood and affect are appropriate. Neurological: Awake, alert and oriented x 3. Sensation is intact, no neuropathy.  Musculoskeletal: No pain,  normal strength and range of motion.  Procedure Note: Vulvar Bx The patient provided them consent for the procedure. The vulvar skin was prepped with Betadine. 1 mL of 1% lidocaine plain was infiltrated into the right anterior labia majora. A 3 mm punch biopsy was used to obtain a single biopsy from the acetowhite lesion on the right mid anterior labia majora. Hemostasis was achieved with silver nitrate. The patient tolerated the procedure well.   Donaciano Eva, MD

## 2015-03-08 NOTE — Addendum Note (Signed)
Addended by: Joylene John D on: 03/08/2015 04:35 PM   Modules accepted: Orders

## 2015-03-13 ENCOUNTER — Telehealth: Payer: Self-pay | Admitting: Gynecologic Oncology

## 2015-03-13 NOTE — Telephone Encounter (Addendum)
Patient called reporting labial swelling after recent biopsy.  Reports light bleeding from the site one to two days after the biopsy then it decreased to light brown drainage.  Reporting swelling with no visible abnormality at the biopsy site.  Advised to use ice to the area and to decrease activity.   Advised to call the office with an update this afternoon.

## 2015-03-13 NOTE — Telephone Encounter (Signed)
Patient calls clinic this afternoon for update.  She notes improvement in swelling. Pt encouraged to continue instructions per Joylene John, NP, such as being off her feet and on lite activity when possible.  She will call back tomorrow for update on her swelling.

## 2015-03-14 ENCOUNTER — Telehealth: Payer: Self-pay | Admitting: *Deleted

## 2015-03-14 NOTE — Telephone Encounter (Addendum)
Received call from patient with status update - she states the swelling is continuing to improve and she will continue to use ice on the area. Told patient that if her symptoms do not continue to improve to please call our office back - patient agreeable to this.

## 2015-03-14 NOTE — Telephone Encounter (Signed)
Called patient to notify her that her procedure is scheduled for 03/26/15 at [facility]. Told her that she will receive a phone call the day before with specific instructions and what time she needs to arrive. Patient wrote down information and is agreeable to call our office back if she has any additional questions prior to surgery.

## 2015-03-22 ENCOUNTER — Encounter (HOSPITAL_BASED_OUTPATIENT_CLINIC_OR_DEPARTMENT_OTHER): Payer: Self-pay | Admitting: *Deleted

## 2015-03-22 NOTE — Progress Notes (Signed)
NPO AFTER MN.  ARRIVE AT 0715.  NEEDS ISTAT.  CURRENT EKG IN CHART AND EPIC.  WILL TAKE PREDNISONE, PRILOSEC, BUSPAR, AND DILTIAZEM AM DOS W/ SIPS OF WATER.

## 2015-03-26 ENCOUNTER — Encounter (HOSPITAL_BASED_OUTPATIENT_CLINIC_OR_DEPARTMENT_OTHER)
Admission: RE | Disposition: A | Discharge status: Home / Self Care | Payer: Self-pay | Source: Ambulatory Visit | Attending: Gynecologic Oncology

## 2015-03-26 ENCOUNTER — Ambulatory Visit (HOSPITAL_BASED_OUTPATIENT_CLINIC_OR_DEPARTMENT_OTHER): Payer: Medicare Other | Admitting: Anesthesiology

## 2015-03-26 ENCOUNTER — Ambulatory Visit (HOSPITAL_BASED_OUTPATIENT_CLINIC_OR_DEPARTMENT_OTHER)
Admission: RE | Admit: 2015-03-26 | Discharge: 2015-03-26 | Disposition: A | Discharge status: Home / Self Care | Payer: Medicare Other | Source: Ambulatory Visit | Attending: Gynecologic Oncology | Admitting: Gynecologic Oncology

## 2015-03-26 ENCOUNTER — Encounter (HOSPITAL_BASED_OUTPATIENT_CLINIC_OR_DEPARTMENT_OTHER): Payer: Self-pay

## 2015-03-26 DIAGNOSIS — K529 Noninfective gastroenteritis and colitis, unspecified: Secondary | ICD-10-CM | POA: Diagnosis not present

## 2015-03-26 DIAGNOSIS — I10 Essential (primary) hypertension: Secondary | ICD-10-CM | POA: Insufficient documentation

## 2015-03-26 DIAGNOSIS — F329 Major depressive disorder, single episode, unspecified: Secondary | ICD-10-CM | POA: Insufficient documentation

## 2015-03-26 DIAGNOSIS — M069 Rheumatoid arthritis, unspecified: Secondary | ICD-10-CM | POA: Insufficient documentation

## 2015-03-26 DIAGNOSIS — I252 Old myocardial infarction: Secondary | ICD-10-CM | POA: Diagnosis not present

## 2015-03-26 DIAGNOSIS — K219 Gastro-esophageal reflux disease without esophagitis: Secondary | ICD-10-CM | POA: Diagnosis not present

## 2015-03-26 DIAGNOSIS — Z91040 Latex allergy status: Secondary | ICD-10-CM | POA: Insufficient documentation

## 2015-03-26 DIAGNOSIS — Z79899 Other long term (current) drug therapy: Secondary | ICD-10-CM | POA: Insufficient documentation

## 2015-03-26 DIAGNOSIS — D071 Carcinoma in situ of vulva: Secondary | ICD-10-CM | POA: Insufficient documentation

## 2015-03-26 DIAGNOSIS — F419 Anxiety disorder, unspecified: Secondary | ICD-10-CM | POA: Insufficient documentation

## 2015-03-26 DIAGNOSIS — Z888 Allergy status to other drugs, medicaments and biological substances status: Secondary | ICD-10-CM | POA: Diagnosis not present

## 2015-03-26 DIAGNOSIS — Z90710 Acquired absence of both cervix and uterus: Secondary | ICD-10-CM | POA: Insufficient documentation

## 2015-03-26 DIAGNOSIS — J42 Unspecified chronic bronchitis: Secondary | ICD-10-CM | POA: Insufficient documentation

## 2015-03-26 DIAGNOSIS — I2510 Atherosclerotic heart disease of native coronary artery without angina pectoris: Secondary | ICD-10-CM | POA: Insufficient documentation

## 2015-03-26 DIAGNOSIS — F17210 Nicotine dependence, cigarettes, uncomplicated: Secondary | ICD-10-CM | POA: Insufficient documentation

## 2015-03-26 HISTORY — PX: VULVECTOMY: SHX1086

## 2015-03-26 LAB — POCT I-STAT 4, (NA,K, GLUC, HGB,HCT)
GLUCOSE: 108 mg/dL — AB (ref 65–99)
HCT: 33 % — ABNORMAL LOW (ref 36.0–46.0)
Hemoglobin: 11.2 g/dL — ABNORMAL LOW (ref 12.0–15.0)
POTASSIUM: 4.3 mmol/L (ref 3.5–5.1)
SODIUM: 127 mmol/L — AB (ref 135–145)

## 2015-03-26 SURGERY — WIDE EXCISION VULVECTOMY
Anesthesia: General

## 2015-03-26 MED ORDER — PROPOFOL 10 MG/ML IV BOLUS
INTRAVENOUS | Status: DC | PRN
  Administered 2015-03-26: 130 mg via INTRAVENOUS

## 2015-03-26 MED ORDER — PROMETHAZINE HCL 25 MG/ML IJ SOLN
6.2500 mg | INTRAMUSCULAR | Status: DC | PRN
  Filled 2015-03-26: qty 1

## 2015-03-26 MED ORDER — LIDOCAINE HCL 1 % IJ SOLN
INTRAMUSCULAR | Status: DC | PRN
  Administered 2015-03-26: 7 mL

## 2015-03-26 MED ORDER — IBUPROFEN 600 MG PO TABS: 600.0000 mg | ORAL_TABLET | Freq: Four times a day (QID) | ORAL | Status: DC | PRN

## 2015-03-26 MED ORDER — FENTANYL CITRATE (PF) 100 MCG/2ML IJ SOLN
INTRAMUSCULAR | Status: AC
Start: 2015-03-26 — End: 2015-03-26
  Filled 2015-03-26: qty 4

## 2015-03-26 MED ORDER — FENTANYL CITRATE (PF) 100 MCG/2ML IJ SOLN
25.0000 ug | INTRAMUSCULAR | Status: DC | PRN
  Filled 2015-03-26: qty 1

## 2015-03-26 MED ORDER — FENTANYL CITRATE (PF) 100 MCG/2ML IJ SOLN
INTRAMUSCULAR | Status: DC | PRN
  Administered 2015-03-26 (×2): 50 ug via INTRAVENOUS

## 2015-03-26 MED ORDER — ONDANSETRON HCL 4 MG/2ML IJ SOLN
INTRAMUSCULAR | Status: DC | PRN
  Administered 2015-03-26: 4 mg via INTRAVENOUS

## 2015-03-26 MED ORDER — LACTATED RINGERS IV SOLN
INTRAVENOUS | Status: DC
  Administered 2015-03-26 (×2): via INTRAVENOUS
  Filled 2015-03-26: qty 1000

## 2015-03-26 MED ORDER — HYDROCODONE-ACETAMINOPHEN 5-325 MG PO TABS: 1.0000 | ORAL_TABLET | Freq: Four times a day (QID) | ORAL | Status: DC | PRN

## 2015-03-26 MED ORDER — LIDOCAINE HCL (CARDIAC) 20 MG/ML IV SOLN
INTRAVENOUS | Status: DC | PRN
  Administered 2015-03-26: 60 mg via INTRAVENOUS

## 2015-03-26 MED ORDER — MEPERIDINE HCL 25 MG/ML IJ SOLN
6.2500 mg | INTRAMUSCULAR | Status: DC | PRN
  Filled 2015-03-26: qty 1

## 2015-03-26 MED ORDER — DOCUSATE SODIUM 100 MG PO CAPS: 100.0000 mg | ORAL_CAPSULE | Freq: Two times a day (BID) | ORAL | Status: DC

## 2015-03-26 MED ORDER — DEXAMETHASONE SODIUM PHOSPHATE 4 MG/ML IJ SOLN
INTRAMUSCULAR | Status: DC | PRN
  Administered 2015-03-26: 10 mg via INTRAVENOUS

## 2015-03-26 MED ORDER — ACETIC ACID 5 % SOLN
Status: DC | PRN
  Administered 2015-03-26: 1 via TOPICAL

## 2015-03-26 SURGICAL SUPPLY — 45 items
APPLICATOR COTTON TIP 6IN STRL (MISCELLANEOUS) IMPLANT
BLADE CLIPPER SURG (BLADE) IMPLANT
BLADE SURG 15 STRL LF DISP TIS (BLADE) ×1 IMPLANT
BLADE SURG 15 STRL SS (BLADE) ×1
BNDG GAUZE ELAST 4 BULKY (GAUZE/BANDAGES/DRESSINGS) ×2 IMPLANT
BRIEF STRETCH FOR OB PAD LRG (UNDERPADS AND DIAPERS) ×2 IMPLANT
CANISTER SUCTION 2500CC (MISCELLANEOUS) ×2 IMPLANT
CATH FOLEY 2WAY SLVR  5CC 14FR (CATHETERS)
CATH FOLEY 2WAY SLVR 5CC 14FR (CATHETERS) IMPLANT
CATH ROBINSON RED A/P 14FR (CATHETERS) IMPLANT
COVER BACK TABLE 60X90IN (DRAPES) ×2 IMPLANT
DRAPE LG THREE QUARTER DISP (DRAPES) ×2 IMPLANT
DRAPE UNDERBUTTOCKS STRL (DRAPE) ×2 IMPLANT
GAUZE SPONGE 4X4 12PLY STRL (GAUZE/BANDAGES/DRESSINGS) IMPLANT
GAUZE SPONGE 4X4 16PLY XRAY LF (GAUZE/BANDAGES/DRESSINGS) IMPLANT
GLOVE BIO SURGEON STRL SZ 6 (GLOVE) ×4 IMPLANT
LEGGING LITHOTOMY PAIR STRL (DRAPES) ×2 IMPLANT
MANIFOLD NEPTUNE II (INSTRUMENTS) IMPLANT
NEEDLE HYPO 22GX1.5 SAFETY (NEEDLE) IMPLANT
NEEDLE HYPO 25X1 1.5 SAFETY (NEEDLE) ×2 IMPLANT
NS IRRIG 500ML POUR BTL (IV SOLUTION) ×2 IMPLANT
PACK BASIN DAY SURGERY FS (CUSTOM PROCEDURE TRAY) ×2 IMPLANT
PAD OB MATERNITY 4.3X12.25 (PERSONAL CARE ITEMS) ×2 IMPLANT
PENCIL BUTTON HOLSTER BLD 10FT (ELECTRODE) ×2 IMPLANT
SCOPETTES 8  STERILE (MISCELLANEOUS)
SCOPETTES 8 STERILE (MISCELLANEOUS) IMPLANT
SUT VIC AB 0 SH 27 (SUTURE) ×2 IMPLANT
SUT VIC AB 2-0 CT2 27 (SUTURE) IMPLANT
SUT VIC AB 2-0 SH 27 (SUTURE)
SUT VIC AB 2-0 SH 27X BRD (SUTURE) IMPLANT
SUT VIC AB 3-0 PS2 18 (SUTURE) ×1
SUT VIC AB 3-0 PS2 18XBRD (SUTURE) ×1 IMPLANT
SUT VIC AB 3-0 SH 27 (SUTURE) ×2
SUT VIC AB 3-0 SH 27X BRD (SUTURE) ×2 IMPLANT
SUT VICRYL 2 0 18  UND BR (SUTURE)
SUT VICRYL 2 0 18 UND BR (SUTURE) IMPLANT
SUT VICRYL 4-0 PS2 18IN ABS (SUTURE) ×4 IMPLANT
SYR BULB IRRIGATION 50ML (SYRINGE) ×2 IMPLANT
SYRINGE CONTROL L 12CC (SYRINGE) ×2 IMPLANT
TOWEL OR 17X24 6PK STRL BLUE (TOWEL DISPOSABLE) ×2 IMPLANT
TRAY DSU PREP LF (CUSTOM PROCEDURE TRAY) ×2 IMPLANT
TUBE CONNECTING 12X1/4 (SUCTIONS) ×2 IMPLANT
UNDERPAD 30X30 INCONTINENT (UNDERPADS AND DIAPERS) ×2 IMPLANT
WATER STERILE IRR 500ML POUR (IV SOLUTION) ×2 IMPLANT
YANKAUER SUCT BULB TIP NO VENT (SUCTIONS) ×2 IMPLANT

## 2015-03-26 NOTE — Anesthesia Procedure Notes (Signed)
Procedure Name: LMA Insertion Date/Time: 03/26/2015 9:13 AM Performed by: Mechele Claude Pre-anesthesia Checklist: Patient identified, Emergency Drugs available, Suction available and Patient being monitored Patient Re-evaluated:Patient Re-evaluated prior to inductionOxygen Delivery Method: Circle System Utilized Preoxygenation: Pre-oxygenation with 100% oxygen Intubation Type: IV induction Ventilation: Mask ventilation without difficulty LMA: LMA inserted LMA Size: 3.0 Number of attempts: 1 Airway Equipment and Method: bite block Placement Confirmation: positive ETCO2 Tube secured with: Tape Dental Injury: Teeth and Oropharynx as per pre-operative assessment

## 2015-03-26 NOTE — H&P (Signed)
PREOP H&P  Chief complaint: VIN III  Assessment:  69 y.o. 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  S/p vulvar and clitoral laser on 11/03/14. Recurrence on right labia majora  Plan: 1) VIN III - recommend wide local excison of right vulva.    HPI: Regina Ortiz is a 69 y.o. year old who underwent a CO2 laser of the clitoral hood and left labia minora on 11/03/14 for VIN 3 without complications. Her postoperative course was uncomplicated.  She has multifocal lower genital tract dysplasia, both VIN and VAIN. History is notable for hysterectomy for carcinoma in situ in 1997. She subsequently underwent upper vaginal colpectomies in 1983 and 2000 for vaginal dysplasia. In June 2011, a vulva biopsy demonstrated VIN III. In August 2011, she underwent wide local excision of a right-sided inferior labia majora lesion and CO2 ablation of the right labia minora. She denies any vulvar pruritus or discharge.   Pap 2010, 2011, 2012 2013 wnl. Patient seen 01/2012. Bx c/w VIN III. Patient given trial of Aldara. She stated that it has been difficult to apply Aldara to the external genitalia at the site of the lesions and requested CO2 laser treatment. She underwent Co2 laser ablation of the external genitalia on 06/22/2012.  Pap 03/2013 wnl  Patient underwent excision of an external and internal hemorrhoid with AIN 04/2013.  Biopsy of left labial lesion on 10/12/14 revealed VINIII.  CO2 laser of clitoral hood and left labia minora (anterior) on 11/03/14.  She has been started on humira for her RA and this is also significantly helping her colitis symptoms.   Interval Hx:   She presents today for partial simple right vulvectomy.    Review of systems: Constitutional: She has no weight gain or weight loss. She has no fever or chills. Eyes: No blurred vision Ears, Nose, Mouth, Throat: No dizziness, headaches or changes in hearing. No mouth  sores. Cardiovascular: No chest pain, palpitations or edema. Respiratory: No shortness of breath, wheezing or cough Gastrointestinal: She has normal bowel movements without diarrhea or constipation. She denies any nausea or vomiting. She denies blood in her stool or heart burn. Genitourinary: She denies pelvic pain, pelvic pressure or changes in her urinary function. She has no hematuria, dysuria, or incontinence. She has no irregular vaginal bleeding or vaginal discharge Musculoskeletal: Denies muscle weakness or joint pains.  Skin: She has no skin changes, rashes or itching Neurological: Denies dizziness or headaches. No neuropathy, no numbness or tingling. Psychiatric: She denies depression or anxiety. Hematologic/Lymphatic: No easy bruising or bleeding   Physical Exam: Blood pressure 151/70, pulse 82, temperature 98.5 F (36.9 C), temperature source Oral, resp. rate 18, height 5\' 4"  (1.626 m), weight 109 lb 12.8 oz (49.805 kg), SpO2 98 %. General: Well dressed, well nourished in no apparent distress.  HEENT: Normocephalic and atraumatic, no lesions. Extraocular muscles intact. Sclerae anicteric. Pupils equal, round, reactive. No mouth sores or ulcers. Thyroid is normal size, not nodular, midline. Skin: No lesions or rashes. Breasts: deferred Lungs: deferred Cardiovascular: deferred Abdomen: deferred Genitourinary: acetic acid 4% applied. 1cm area of acetowhite changes noted on the mid/anterior right labia majora inner lip. Also some slightly verucous to palpate lesions more inferiorally. Anterior lesion biopsied at previous visit. Extremities: No cyanosis, clubbing or edema. No calf tenderness or erythema. No palpable cords. Psychiatric: Mood and affect are appropriate. Neurological: Awake, alert and oriented x 3. Sensation is intact, no neuropathy.  Musculoskeletal: No pain, normal strength and range  of motion.  Donaciano Eva, MD

## 2015-03-26 NOTE — Transfer of Care (Signed)
  Last Vitals:  Filed Vitals:   03/26/15 0724  BP: 168/63  Pulse: 73  Temp: 36.5 C  Resp: 16    Immediate Anesthesia Transfer of Care Note  Patient: Regina Ortiz  Procedure(s) Performed: Procedure(s) (LRB): WIDE LOCA  EXCISION OF VULVAR (N/A)  Patient Location: PACU  Anesthesia Type: General  Level of Consciousness: awake, alert  and oriented  Airway & Oxygen Therapy: Patient Spontanous Breathing and Patient connected to face mask oxygen  Post-op Assessment: Report given to PACU RN and Post -op Vital signs reviewed and stable  Post vital signs: Reviewed and stable  Complications: No apparent anesthesia complications

## 2015-03-26 NOTE — Anesthesia Postprocedure Evaluation (Signed)
  Anesthesia Post-op Note  Patient: Regina Ortiz  Procedure(s) Performed: Procedure(s) (LRB): WIDE LOCA  EXCISION OF VULVAR (N/A)  Patient Location: PACU  Anesthesia Type: General  Level of Consciousness: awake and alert   Airway and Oxygen Therapy: Patient Spontanous Breathing  Post-op Pain: mild  Post-op Assessment: Post-op Vital signs reviewed, Patient's Cardiovascular Status Stable, Respiratory Function Stable, Patent Airway and No signs of Nausea or vomiting  Last Vitals:  Filed Vitals:   03/26/15 1056  BP: 170/60  Pulse: 68  Temp: 36.6 C  Resp: 14    Post-op Vital Signs: stable   Complications: No apparent anesthesia complications

## 2015-03-26 NOTE — Anesthesia Preprocedure Evaluation (Signed)
Anesthesia Evaluation  Patient identified by MRN, date of birth, ID band Patient awake    Reviewed: Allergy & Precautions, H&P , NPO status , Patient's Chart, lab work & pertinent test results  Airway Mallampati: III  TM Distance: >3 FB Neck ROM: Full    Dental  (+) Dental Advisory Given, Edentulous Upper   Pulmonary shortness of breath and with exertion, COPDCurrent Smoker,  Chronic bronchitis breath sounds clear to auscultation  Pulmonary exam normal       Cardiovascular hypertension, Pt. on medications - CAD and - Past MI Normal cardiovascular examRhythm:Regular Rate:Normal  LBBB   Neuro/Psych PSYCHIATRIC DISORDERS Anxiety Depression    GI/Hepatic negative GI ROS, Neg liver ROS, GERD-  Medicated and Controlled,  Endo/Other  negative endocrine ROS  Renal/GU ARFRenal disease     Musculoskeletal  (+) Arthritis -, Rheumatoid disorders,    Abdominal   Peds  Hematology negative hematology ROS (+)   Anesthesia Other Findings   Reproductive/Obstetrics negative OB ROS                             Anesthesia Physical  Anesthesia Plan  ASA: III  Anesthesia Plan: General   Post-op Pain Management:    Induction: Intravenous  Airway Management Planned: LMA  Additional Equipment:   Intra-op Plan:   Post-operative Plan:   Informed Consent:   Plan Discussed with: Surgeon  Anesthesia Plan Comments:         Anesthesia Quick Evaluation

## 2015-03-26 NOTE — Discharge Instructions (Signed)
Vulvectomy, Care After  °The vulva is the external female genitalia, outside and around the vagina and pubic bone. It consists of:  °The skin on, and in front of, the pubic bone.  °The clitoris.  °The labia majora (large lips) on the outside of the vagina.  °The labia minora (small lips) around the opening of the vagina.  °The opening and the skin in and around the vagina. °A vulvectomy is the removal of the tissue of the vulva, which sometimes includes removal of the lymph nodes and tissue in the groin areas.  °These discharge instructions provide you with general information on caring for yourself after you leave the hospital. It is also important that you know the warning signs of complications, so that you can seek treatment. Please read the instructions outlined below and refer to this sheet in the next few weeks. Your caregiver may also give you specific information and medicines. If you have any questions or complications after discharge, please call your caregiver.  °ACTIVITY  °Rest as much as possible the first two weeks after discharge.  °Arrange to have help from family or others with your daily activities when you go home.  °Avoid heavy lifting (more than 5 pounds), pushing, or pulling.  °If you feel tired, balance your activity with rest periods.  °Follow your caregiver's instruction about climbing stairs and driving a car.  °Increase activity gradually.  °Do not exercise until you have permission from your caregiver. °LEG AND FOOT CARE  °If your doctor has removed lymph nodes from your groin area, there may be an increase in swelling of your legs and feet. You can help prevent swelling by doing the following:  °Elevate your legs while sitting or lying down.  °If your caregiver has ordered special stockings, wear them according to instructions.  °Avoid standing in one place for long periods of time.  °Call the physical therapy department if you have any questions about swelling or treatment for  swelling.  °Avoid salt in your diet. It can cause fluid retention and swelling.  °Do not cross your legs, especially when sitting. °NUTRITION  °You may resume your normal diet.  °Drink 6 to 8 glasses of fluids a day.  °Eat a healthy, balanced diet including portions of food from the meat (protein), milk, fruit, vegetable, and bread groups.  °Your caregiver may recommend you take a multivitamin with iron. °ELIMINATION  °You may notice that your stream of urine is at a different angle, and may tend to spray. Using a plastic funnel may help to decrease urine spray.  °If constipation occurs, drink more liquids, and add more fruits, vegetables, and bran to your diet. You may take a mild laxative, such as Milk of Magnesia, Metamucil, or a stool softener such as Colace, with permission from your caregiver. °HYGIENE  °You may shower and wash your hair.  °Check with your caregiver about tub baths.  °Do not add any bath oils or chemicals to your bath water, after you have permission to take baths.  °Clean yourself well after moving your bowels.  °After urinating, wipe from top to bottom.  °A sitz bath will help keep your perineal area clean, reduce swelling, and provide comfort. °HOME CARE INSTRUCTIONS  °Take your temperature twice a day and record it, especially if you feel feverish or have chills.  °Follow your caregiver's instructions about medicines, activity, and follow-up appointments after surgery.  °Do not drink alcohol while taking pain medicine.  °Change your dressing as advised by   your caregiver.  °You may take over-the-counter medicine for pain, recommended by your caregiver.  °If your pain is not relieved with medicine, call your caregiver.  °Do not take aspirin because it can cause bleeding.  °Do not douche or use tampons (use a nonperfumed sanitary pad).  °Do not have sexual intercourse until your caregiver gives you permission. Hugging, kissing, and playful sexual activity is fine with your caregiver's  permission.  °Warm sitz baths, with your caregiver's permission, are helpful to control swelling and discomfort.  °Take showers instead of baths, until your caregiver gives you permission to take baths.  °You may take a mild medicine for constipation, recommended by your caregiver. Bran foods and drinking a lot of fluids will help with constipation.  °Make sure your family understands everything about your operation and recovery. °SEEK MEDICAL CARE IF:  °You notice swelling and redness around the wound area.  °You notice a foul smell coming from the wound or on the surgical dressing.  °You notice the wound is separating.  °You have painful or bloody urination.  °You develop nausea and vomiting.  °You develop diarrhea.  °You develop a rash.  °You have a reaction or allergy from the medicine.  °You feel dizzy or light-headed.  °You need stronger pain medicine. °SEEK IMMEDIATE MEDICAL CARE IF:  °You develop a temperature of 102° F (38.9° C) or higher.  °You pass out.  °You develop leg or chest pain.  °You develop abdominal pain.  °You develop shortness of breath.  °You develop bleeding from the wound area.  °You see pus in the wound area. °MAKE SURE YOU:  °Understand these instructions.  °Will watch your condition.  °Will get help right away if you are not doing well or get worse. °Document Released: 04/01/2004 Document Revised: 01/02/2014 Document Reviewed: 07/20/2009  °[employer]® Patient Information ©2015 [employer]. This information is not intended to replace advice given to you by your health care provider. Make sure you discuss any questions you have with your health care provider.  ° ° °Post Anesthesia Home Care Instructions ° °Activity: °Get plenty of rest for the remainder of the day. A responsible adult should stay with you for 24 hours following the procedure.  °For the next 24 hours, DO NOT: °-Drive a car °-Operate machinery °-Drink alcoholic beverages °-Take any medication unless instructed by your  physician °-Make any legal decisions or sign important papers. ° °Meals: °Start with liquid foods such as gelatin or soup. Progress to regular foods as tolerated. Avoid greasy, spicy, heavy foods. If nausea and/or vomiting occur, drink only clear liquids until the nausea and/or vomiting subsides. Call your physician if vomiting continues. ° °Special Instructions/Symptoms: °Your throat may feel dry or sore from the anesthesia or the breathing tube placed in your throat during surgery. If this causes discomfort, gargle with warm salt water. The discomfort should disappear within 24 hours. ° °If you had a scopolamine patch placed behind your ear for the management of post- operative nausea and/or vomiting: ° °1. The medication in the patch is effective for 72 hours, after which it should be removed.  Wrap patch in a tissue and discard in the trash. Wash hands thoroughly with soap and water. °2. You may remove the patch earlier than 72 hours if you experience unpleasant side effects which may include dry mouth, dizziness or visual disturbances. °3. Avoid touching the patch. Wash your hands with soap and water after contact with the patch. °  ° ° ° °

## 2015-03-26 NOTE — Op Note (Signed)
OPERATIVE NOTE  PATIENT: Regina Ortiz DATE: 03/26/15   Preop Diagnosis: VIN3  Postoperative Diagnosis: same  Surgery: Partial simple right vulvectomy  Surgeons:  Donaciano Eva, MD Assistant: none  Anesthesia: General   Estimated blood loss: <10 ml  IVF:  125ml   Urine output: 321 ml   Complications: None   Pathology: right labia majora with marking stitch at 12 o'clock  Operative findings: granular acetowhite changes to 1cm area on mid right labia majora.  Procedure: The patient was identified in the preoperative holding area. Informed consent was signed on the chart. Patient was seen history was reviewed and exam was performed.   The patient was then taken to the operating room and placed in the supine position with SCD hose on. General anesthesia was then induced without difficulty. She was then placed in the dorsolithotomy position. The perineum was prepped with Betadine. The vagina was prepped with Betadine. The patient was then draped after the prep was dried. A Foley catheter was inserted into the bladder under sterile conditions.  Timeout was performed the patient, procedure, antibiotic, allergy, and length of procedure. 5% acetic acid solution was applied to the perineum. The vulvar tissues were inspected for areas of acetowhite changes or leukoplakia. The lesion was identified and the marking pen was used to circumscribe the area with appropriate surgical margins. The subcuticular tissues were infiltrated with 1% lidocaine. The 15 blade scalpel was used to make an incision through the skin circumferentially as marked. The skin elipse was grasped and was separated from the underlying deep dermal tissues with the bovie device. After the specimen had been completely resected, it was oriented and marked at 12 o'clock with a 0-vicryl suture. The bovie was used to obtain hemostasis at the surgical bed. The subcutaneous tissues were irrigated and made hemostatic.    The deep dermal layer was approximated with 3-0vicryl mattress sutures to bring the skin edges into approximation and off tension. The wound was closed following langher's lines. The cutaneous layer was closed with interrupted 4-0 vicryl stitches and mattress sutures to ensure a tension free and hemostatic closure. The perineum was again irrigated. The foley was removed.  All instrument, suture, laparotomy, Ray-Tec, and needle counts were correct x2. The patient tolerated the procedure well and was taken recovery room in stable condition. This is [name] dictating an operative note on [name].  Donaciano Eva, MD

## 2015-03-27 ENCOUNTER — Telehealth: Payer: Self-pay | Admitting: *Deleted

## 2015-03-27 ENCOUNTER — Encounter (HOSPITAL_BASED_OUTPATIENT_CLINIC_OR_DEPARTMENT_OTHER): Payer: Self-pay | Admitting: Gynecologic Oncology

## 2015-03-27 NOTE — Telephone Encounter (Signed)
Per Joylene John, NP patient notified that pathology showed VIN III which patient was already aware of but did not show any cancer. Patient states she is doing well today post-op. Per pt, "cutting it out is a million times better than when I had the laser procedure last time." Pt states she has been applying ice to the area and also has been taking ibuprofen as she does not like the way the pain medication makes her feel. Instructed patient she can also rotate tylenol and ibuprofen as needed. No other concerns noted at this time. Patient agreeable to f/u with Dr. Denman George on 04-16-15 as scheduled.

## 2015-04-16 ENCOUNTER — Encounter: Payer: Self-pay | Admitting: Gynecologic Oncology

## 2015-04-16 ENCOUNTER — Ambulatory Visit: Payer: Medicare Other | Attending: Gynecologic Oncology | Admitting: Gynecologic Oncology

## 2015-04-16 VITALS — BP 155/76 | HR 86 | Temp 98.4°F | Resp 18 | Ht 64.0 in | Wt 105.8 lb

## 2015-04-16 DIAGNOSIS — D071 Carcinoma in situ of vulva: Secondary | ICD-10-CM | POA: Diagnosis not present

## 2015-04-16 NOTE — Patient Instructions (Signed)
Plan to follow up with Dr. Rossi in three months or sooner if needed.  Please call for any questions or concerns. 

## 2015-04-16 NOTE — Progress Notes (Signed)
POSTOPERATIVE FOLLOWUP   Chief complaint: VIN III  Assessment:    69 y.o. 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   S/p vulvar and clitoral laser on 11/03/14. Recurrence on right labia majora. S/p wide local excision on right vulva on 03/08/15.  Plan: 1) Followup in 3 months with vulvar colposcopy with acetic acid.    HPI:  Regina Ortiz is a 69 y.o. year old who underwent a CO2 laser of the clitoral hood and left labia minora on 11/03/14 for VIN 3 without complications.  Her postoperative course was uncomplicated.  She has multifocal lower genital tract dysplasia, both VIN and VAIN. History is notable for hysterectomy for carcinoma in situ in 1997. She subsequently underwent upper vaginal colpectomies in 1983 and 2000 for vaginal dysplasia. In June 2011, a vulva biopsy demonstrated VIN III. In August 2011, she underwent wide local excision of a right-sided inferior labia majora lesion and CO2 ablation of the right labia minora. She denies any vulvar pruritus or discharge.   Pap 2010, 2011, 2012 2013 wnl. Patient seen 01/2012. Bx c/w VIN III. Patient given trial of Aldara. She stated that it has been difficult to apply Aldara to the external genitalia at the site of the lesions and requested CO2 laser treatment. She underwent Co2 laser ablation of the external genitalia on 06/22/2012.  Pap 03/2013 wnl  Patient underwent excision of an external and internal hemorrhoid with AIN 04/2013.  Biopsy of left labial lesion on 10/12/14 revealed VINIII.  CO2 laser of clitoral hood and left labia minora (anterior) on 11/03/14.  She was started on humira in the spring, 2016 for her RA and this is also significantly helping her colitis symptoms.  She developed recurrence of VIN3 on right vulva in July 2016, and underwent wide local excision of right vulva on July 25th, 2016. Pathology showed VIN3 involving the 6 o'clock margin.  Interval Hx: she has had no  specific issues postop.   Review of systems: Constitutional:  She has no weight gain or weight loss. She has no fever or chills. Eyes: No blurred vision Ears, Nose, Mouth, Throat: No dizziness, headaches or changes in hearing. No mouth sores. Cardiovascular: No chest pain, palpitations or edema. Respiratory:  No shortness of breath, wheezing or cough Gastrointestinal: She has normal bowel movements without diarrhea or constipation. She denies any nausea or vomiting. She denies blood in her stool or heart burn. Genitourinary:  She denies pelvic pain, pelvic pressure or changes in her urinary function. She has no hematuria, dysuria, or incontinence. She has no irregular vaginal bleeding or vaginal discharge Musculoskeletal: Denies muscle weakness or joint pains.  Skin:  She has no skin changes, rashes or itching Neurological:  Denies dizziness or headaches. No neuropathy, no numbness or tingling. Psychiatric:  She denies depression or anxiety. Hematologic/Lymphatic:   No easy bruising or bleeding   Physical Exam: Blood pressure 155/76, pulse 86, temperature 98.4 F (36.9 C), temperature source Oral, resp. rate 18, height 5\' 4"  (1.626 m), weight 105 lb 12.8 oz (47.991 kg), SpO2 100 %. General: Well dressed, well nourished in no apparent distress.   HEENT:  Normocephalic and atraumatic, no lesions.  Extraocular muscles intact. Sclerae anicteric. Pupils equal, round, reactive. No mouth sores or ulcers. Thyroid is normal size, not nodular, midline. Skin:  No lesions or rashes. Breasts:  deferred Lungs:  deferred Cardiovascular: deferred Abdomen:  deferred Genitourinary: right vulva incision in tact and healing well. No erythema or infection. Leukoplakia  on anterior labial minora bilaterally. Extremities: No cyanosis, clubbing or edema.  No calf tenderness or erythema. No palpable cords. Psychiatric: Mood and affect are appropriate. Neurological: Awake, alert and oriented x 3. Sensation is  intact, no neuropathy.  Musculoskeletal: No pain, normal strength and range of motion.    Donaciano Eva, MD

## 2015-05-23 ENCOUNTER — Other Ambulatory Visit: Payer: Self-pay

## 2015-05-23 DIAGNOSIS — Z1231 Encounter for screening mammogram for malignant neoplasm of breast: Secondary | ICD-10-CM

## 2015-05-25 ENCOUNTER — Ambulatory Visit: Payer: Medicare Other

## 2015-06-01 ENCOUNTER — Ambulatory Visit
Admission: RE | Admit: 2015-06-01 | Discharge: 2015-06-01 | Disposition: A | Discharge status: Home / Self Care | Payer: Medicare Other | Source: Ambulatory Visit

## 2015-06-01 DIAGNOSIS — Z1231 Encounter for screening mammogram for malignant neoplasm of breast: Secondary | ICD-10-CM

## 2015-07-23 ENCOUNTER — Ambulatory Visit: Payer: Medicare Other | Admitting: Gynecologic Oncology

## 2015-08-13 ENCOUNTER — Encounter: Payer: Self-pay | Admitting: Gynecologic Oncology

## 2015-08-13 ENCOUNTER — Ambulatory Visit: Payer: Medicare Other | Attending: Gynecologic Oncology | Admitting: Gynecologic Oncology

## 2015-08-13 VITALS — BP 152/73 | HR 85 | Temp 98.1°F | Resp 18 | Ht 63.5 in | Wt 103.2 lb

## 2015-08-13 DIAGNOSIS — D071 Carcinoma in situ of vulva: Secondary | ICD-10-CM | POA: Insufficient documentation

## 2015-08-13 NOTE — Patient Instructions (Signed)
Plan to follow up in six months or sooner if needed or if new symptoms arise.  Please call for any questions or concerns.

## 2015-08-13 NOTE — Progress Notes (Signed)
POSTOPERATIVE FOLLOWUP   Chief complaint: VIN III  Assessment:    69 y.o. 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   S/p vulvar and clitoral laser on 11/03/14. Recurrence on right labia majora. S/p wide local excision on right vulva on 03/08/15.  Plan: 1) Followup in 6 months with vulvar colposcopy with acetic acid.    HPI:  Regina Ortiz is a 69 y.o. year old who underwent a CO2 laser of the clitoral hood and left labia minora on 11/03/14 for VIN 3 without complications.  Her postoperative course was uncomplicated.  She has multifocal lower genital tract dysplasia, both VIN and VAIN. History is notable for hysterectomy for carcinoma in situ in 1997. She subsequently underwent upper vaginal colpectomies in 1983 and 2000 for vaginal dysplasia. In June 2011, a vulva biopsy demonstrated VIN III. In August 2011, she underwent wide local excision of a right-sided inferior labia majora lesion and CO2 ablation of the right labia minora. She denies any vulvar pruritus or discharge.   Pap 2010, 2011, 2012 2013 wnl. Patient seen 01/2012. Bx c/w VIN III. Patient given trial of Aldara. She stated that it has been difficult to apply Aldara to the external genitalia at the site of the lesions and requested CO2 laser treatment. She underwent Co2 laser ablation of the external genitalia on 06/22/2012.  Pap 03/2013 wnl  Patient underwent excision of an external and intern al hemorrhoid with AIN 04/2013.  Biopsy of left labial lesion on 10/12/14 revealed VINIII.  CO2 laser of clitoral hood and left labia minora (anterior) on 11/03/14.  She was started on humira in the spring, 2016 for her RA and this is also significantly helping her colitis symptoms.  She developed recurrence of VIN3 on right vulva in July 2016, and underwent wide local excision of right vulva on July 25th, 2016. Pathology showed VIN3 involving the 6 o'clock margin.  Interval Hx: she has had no  specific issues postop.   Review of systems: Constitutional:  She has no weight gain or weight loss. She has no fever or chills. Eyes: No blurred vision Ears, Nose, Mouth, Throat: No dizziness, headaches or changes in hearing. No mouth sores. Cardiovascular: No chest pain, palpitations or edema. Respiratory:  No shortness of breath, wheezing or cough Gastrointestinal: She has normal bowel movements without diarrhea or constipation. She denies any nausea or vomiting. She denies blood in her stool or heart burn. Genitourinary:  She denies pelvic pain, pelvic pressure or changes in her urinary function. She has no hematuria, dysuria, or incontinence. She has no irregular vaginal bleeding or vaginal discharge Musculoskeletal: Denies muscle weakness or joint pains.  Skin:  She has no skin changes, rashes or itching Neurological:  Denies dizziness or headaches. No neuropathy, no numbness or tingling. Psychiatric:  She denies depression or anxiety. Hematologic/Lymphatic:   No easy bruising or bleeding   Physical Exam: Blood pressure 152/73, pulse 85, temperature 98.1 F (36.7 C), temperature source Oral, resp. rate 18, height 5' 3.5" (1.613 m), weight 103 lb 3.2 oz (46.811 kg), SpO2 100 %. General: Well dressed, well nourished in no apparent distress.   HEENT:  Normocephalic and atraumatic, no lesions.  Extraocular muscles intact. Sclerae anicteric. Pupils equal, round, reactive. No mouth sores or ulcers. Thyroid is normal size, not nodular, midline. Skin:  No lesions or rashes. Breasts:  deferred Lungs:  deferred Cardiovascular: deferred Abdomen:  deferred Genitourinary: right vulva incision in tact and healed. No erythema or infection. No  leukoplakia. Acetic acid applied. No acetowhite areas identified.  Extremities: No cyanosis, clubbing or edema.  No calf tenderness or erythema. No palpable cords. Psychiatric: Mood and affect are appropriate. Neurological: Awake, alert and oriented x 3.  Sensation is intact, no neuropathy.  Musculoskeletal: No pain, normal strength and range of motion.  Donaciano Eva, MD

## 2015-09-19 ENCOUNTER — Encounter: Payer: Self-pay | Admitting: Cardiology

## 2015-09-19 ENCOUNTER — Encounter: Payer: Self-pay | Admitting: *Deleted

## 2015-09-19 ENCOUNTER — Ambulatory Visit (INDEPENDENT_AMBULATORY_CARE_PROVIDER_SITE_OTHER): Payer: PPO | Admitting: Cardiology

## 2015-09-19 VITALS — BP 130/64 | HR 83 | Ht 63.5 in | Wt 105.4 lb

## 2015-09-19 DIAGNOSIS — R0989 Other specified symptoms and signs involving the circulatory and respiratory systems: Secondary | ICD-10-CM | POA: Diagnosis not present

## 2015-09-19 DIAGNOSIS — I447 Left bundle-branch block, unspecified: Secondary | ICD-10-CM

## 2015-09-19 DIAGNOSIS — Z720 Tobacco use: Secondary | ICD-10-CM | POA: Diagnosis not present

## 2015-09-19 NOTE — Progress Notes (Signed)
Cardiology Office Note    Date:  09/19/2015   ID:  ISSABELLA Ortiz, DOB [birth year], MRN [id number]  PCP:  Wynelle Fanny  Cardiologist:   Candee Furbish, MD     History of Present Illness:  [name] is a 70 y.o. female with left bundle branch block here for follow-up. New  Previously had a low risk nuclear stress test and echocardiogram which was normal. Previously hyponatremia with over drinking of water.  She's gone through a rough time recently with her husband who had a major depressive disorder, breakdown. She was in and out of [employer], [employer]. Struggling this.  Continues to smoke. No significant chest pain. Rare palpitations. Mild ankle edema noted. No strokelike symptoms.  She notes bruising that has been fairly easy. Aspirin 81. We have stopped. She takes Humira for rheumatoid arthritis and is under good control.    Past Medical History  Diagnosis Date  . Hypertension   . Osteoporosis   . Bruises easily   . Nocturia   . Smokers' cough ([place])   . Depression   . Anxiety   . Short of breath on exertion   . Macrocytosis   . Chronic hyponatremia   . Seronegative rheumatoid arthritis ([place])   . LBBB (left bundle branch block) W/ PROLONGED PR    CARDIOLOGSIT-  DR Johnsie Cancel-   LOV IN EPIC  . Venous insufficiency of leg     , Edema  . History of chronic bronchitis   . VIN III (vulvar intraepithelial neoplasia III)   . COPD (chronic obstructive pulmonary disease) ([place])   . Colitis   . Chronic diarrhea   . GERD (gastroesophageal reflux disease)     Past Surgical History  Procedure Laterality Date  . Wide local excision of labia majora  04-04-2010    Right-sided lesion, CO2 ablation of right labia minora  . Excision vulvar lesions  07/2012  . Appendectomy  1992  . Repair fistula in ano/  i & d perirectal abscess  10-26-2002  DR Johnathan Hausen  . Cardiovascular stress test  06-23-2012  DR Jenkins Rouge    NORMAL LEXISCAN MYOVIEW/ EF 83%/  NO  ISCHEMIA  . Transthoracic echocardiogram  06-23-2012    NORMAL LVSF/ EF 55-60%  . Co2 laser application  [id number]    Procedure: CO2 LASER APPLICATION;  Surgeon: Janie Morning, MD PHD;  Location: [facility];  Service: Gynecology;  Laterality: N/A;  Laser Vaporization  . Vaginal hysterectomy  1997    partial  . Hemorrhoid surgery N/A 04/15/2013    Procedure: Excision of external and internal  hemorrhoid with AIN, Exam under anesthesia;  Surgeon: Odis Hollingshead, MD;  Location: [facility];  Service: General;  Laterality: N/A;  . Vulvar lesion removal N/A 11/03/2014    Procedure: CO2 LASER OF VULVAR ;  Surgeon: [name], MD;  Location: [facility];  Service: Gynecology;  Laterality: N/A;  . Co2 laser application N/A [id number]    Procedure: CO2 LASER APPLICATION;  Surgeon: [name], MD;  Location: [facility];  Service: Gynecology;  Laterality: N/A;  . Vulvectomy N/A 03/26/2015    Procedure: WIDE LOCA  EXCISION OF VULVAR;  Surgeon: [name], MD;  Location: [facility];  Service: Gynecology;  Laterality: N/A;    Outpatient Prescriptions Prior to Visit  Medication Sig Dispense Refill  . Adalimumab (HUMIRA PEN ) Inject into the skin as directed. Per pt, she does injection every week on  Wednesday - unsure of dose    . b complex vitamins capsule Take 1 capsule by mouth daily.    Marland Kitchen buPROPion (WELLBUTRIN SR) 150 MG 12 hr tablet Take 150 mg by mouth every evening.     . busPIRone (BUSPAR) 30 MG tablet Take 30 mg by mouth 2 (two) times daily.     . Calcium Carbonate-Vitamin D (CALTRATE 600+D PO) Take 1 tablet by mouth every morning.     . Cholecalciferol (VITAMIN D3) 1000 UNITS CAPS Take 1,000 Units by mouth every morning.     . clonazePAM (KLONOPIN) 0.5 MG tablet Take 0.5 mg by mouth as directed.     . cycloSPORINE (RESTASIS) 0.05 % ophthalmic emulsion Place 1 drop into both eyes 2 (two) times daily.    Marland Kitchen diltiazem (TIAZAC) 360 MG 24 hr  capsule Take 360 mg by mouth every morning.    Marland Kitchen ibuprofen (ADVIL,MOTRIN) 600 MG tablet Take 600 mg by mouth every 6 (six) hours as needed for fever, headache, mild pain, moderate pain or cramping.    . lactase (LACTAID) 3000 UNITS tablet Take 1 tablet by mouth as directed.     Marland Kitchen Lysine 500 MG TABS Take 500 mg by mouth 2 (two) times daily.     Marland Kitchen omeprazole (PRILOSEC) 20 MG capsule Take 20 mg by mouth every morning.     . Potassium (POTASSIMIN PO) Take 1 tablet by mouth every evening.     . predniSONE (DELTASONE) 5 MG tablet Take 7.5 mg by mouth daily with breakfast. TAKES 1.5 TABLETS PER DAY--  LONG-TERM USE FOR RA    . PRENATAL VITAMINS PO Take 1 tablet by mouth every morning.     . Probiotic Product (ALIGN) 4 MG CAPS Take 1 tablet by mouth daily.    . traMADol (ULTRAM) 50 MG tablet Take 50 mg by mouth as directed.     . traZODone (DESYREL) 50 MG tablet Take 50 mg by mouth at bedtime.     . valsartan (DIOVAN) 320 MG tablet Take 320 mg by mouth every evening.     Marland Kitchen aspirin 81 MG tablet Take 81 mg by mouth every morning.      No facility-administered medications prior to visit.     Allergies:   Latex; Doxycycline; Lactose intolerance (gi); Prinivil; Triamcinolone acetonide; Amlodipine; and Lotensin   Social History   Social History  . Marital Status: Married    Spouse Name: N/A  . Number of Children: N/A  . Years of Education: N/A   Social History Main Topics  . Smoking status: Current Every Day Smoker -- 0.50 packs/day for 50 years    Types: Cigarettes  . Smokeless tobacco: Never Used  . Alcohol Use: 1.2 oz/week    2 Cans of beer per week     Comment: 3 beers a day  . Drug Use: No  . Sexual Activity: Yes   Other Topics Concern  . None   Social History Narrative     Family History:  The patient's family history includes Cancer in her son; Diabetes in her mother; Hypertension in her father; Lymphoma in her son.   ROS:   Please see the history of present illness.    ROS   No syncope, no bleeding, no orthopnea, no PND. All other systems reviewed and are negative.   PHYSICAL EXAM:   VS:  BP 130/64 mmHg  Pulse 83  Ht 5' 3.5" (1.613 m)  Wt 105 lb 6.4 oz (47.809 kg)  BMI 18.38  kg/m2  SpO2 96%   GEN: Well nourished, well developed, in no acute distress HEENT: normal Neck: no JVD, + bilateral carotid bruits, or masses Cardiac: RRR; no murmurs, rubs, or gallops,no edema  Respiratory:  clear to auscultation bilaterally, normal work of breathing GI: soft, nontender, nondistended, + BS MS: no deformity or atrophyrheumatic changes knuckles in hand Skin: warm and dry, no rash Neuro:  Alert and Oriented x 3, Strength and sensation are intact Psych: euthymic mood, full affect  Wt Readings from Last 3 Encounters:  09/19/15 105 lb 6.4 oz (47.809 kg)  08/13/15 103 lb 3.2 oz (46.811 kg)  04/16/15 105 lb 12.8 oz (47.991 kg)      Studies/Labs Reviewed:   EKG:  EKG is ordered today.  The ekg ordered today demonstrates 09/19/15-sinus rhythm, 86, left bundle branch block  Recent Labs: 11/03/2014: BUN 14; Creatinine, Ser 0.90 03/26/2015: Hemoglobin 11.2*; Potassium 4.3; Sodium 127*   Lipid Panel    Component Value Date/Time   CHOL 142 06/23/2012 0500   TRIG 42 06/23/2012 0500   HDL 82 06/23/2012 0500   CHOLHDL 1.7 06/23/2012 0500   VLDL 8 06/23/2012 0500   LDLCALC 52 06/23/2012 0500    Additional studies/ records that were reviewed today include:   Echo: 06/23/12: - Left ventricle: The cavity size was normal. Systolic function was normal. The estimated ejection fraction was in the range of 55% to 60%. Wall motion was normal; there were no regional wall motion abnormalities. - Atrial septum: No defect or patent foramen ovale was identified.  NUC stress 06/23/12: -Normal Lexiscan myovue EF 83%   ASSESSMENT:    1. Bilateral carotid bruits   2. Tobacco use   3. LBBB (left bundle branch block)      PLAN:  In order of problems listed  above:  1. I hear bruits bilaterally. We will check vascular carotid ultrasound. 2. Continue to advocate smoking cessation 3. Left bundle branch block is chronic. No high-risk symptoms such as syncope.    Medication Adjustments/Labs and Tests Ordered: Current medicines are reviewed at length with the patient today.  Concerns regarding medicines are outlined above.  Medication changes, Labs and Tests ordered today are listed in the Patient Instructions below. Patient Instructions  Medication Instructions:  Please stop your ASA.  Continue all other medications as listed.  Testing/Procedures: Your physician has requested that you have a carotid duplex. This test is an ultrasound of the carotid arteries in your neck. It looks at blood flow through these arteries that supply the brain with blood. Allow one hour for this exam. There are no restrictions or special instructions.  Follow-Up: Follow up in 1 year with Dr. Marlou Porch.  You will receive a letter in the mail 2 months before you are due.  Please call us when you receive this letter to schedule your follow up appointment.  If you need a refill on your cardiac medications before your next appointment, please call your pharmacy.  Thank you for choosing [facility]!!            Signed, Candee Furbish, MD  09/19/2015 3:10 PM    [facility] [address], [place], [place]  [address] Phone: [phone]; Fax: [phone]

## 2015-09-19 NOTE — Patient Instructions (Signed)
Medication Instructions:  Please stop your ASA.  Continue all other medications as listed.  Testing/Procedures: Your physician has requested that you have a carotid duplex. This test is an ultrasound of the carotid arteries in your neck. It looks at blood flow through these arteries that supply the brain with blood. Allow one hour for this exam. There are no restrictions or special instructions.  Follow-Up: Follow up in 1 year with Dr. Marlou Porch.  You will receive a letter in the mail 2 months before you are due.  Please call us when you receive this letter to schedule your follow up appointment.  If you need a refill on your cardiac medications before your next appointment, please call your pharmacy.  Thank you for choosing [facility]!!

## 2015-09-25 DIAGNOSIS — L659 Nonscarring hair loss, unspecified: Secondary | ICD-10-CM | POA: Diagnosis not present

## 2015-09-25 DIAGNOSIS — F331 Major depressive disorder, recurrent, moderate: Secondary | ICD-10-CM | POA: Diagnosis not present

## 2015-09-25 DIAGNOSIS — R600 Localized edema: Secondary | ICD-10-CM | POA: Diagnosis not present

## 2015-09-25 DIAGNOSIS — G4700 Insomnia, unspecified: Secondary | ICD-10-CM | POA: Diagnosis not present

## 2015-09-25 DIAGNOSIS — Z111 Encounter for screening for respiratory tuberculosis: Secondary | ICD-10-CM | POA: Diagnosis not present

## 2015-09-25 DIAGNOSIS — F411 Generalized anxiety disorder: Secondary | ICD-10-CM | POA: Diagnosis not present

## 2015-09-25 DIAGNOSIS — I10 Essential (primary) hypertension: Secondary | ICD-10-CM | POA: Diagnosis not present

## 2015-09-25 DIAGNOSIS — J449 Chronic obstructive pulmonary disease, unspecified: Secondary | ICD-10-CM | POA: Diagnosis not present

## 2015-09-28 ENCOUNTER — Ambulatory Visit (HOSPITAL_COMMUNITY)
Admission: RE | Admit: 2015-09-28 | Discharge: 2015-09-28 | Disposition: A | Discharge status: Home / Self Care | Payer: PPO | Source: Ambulatory Visit | Attending: Cardiovascular Disease | Admitting: Cardiovascular Disease

## 2015-09-28 DIAGNOSIS — R0989 Other specified symptoms and signs involving the circulatory and respiratory systems: Secondary | ICD-10-CM | POA: Insufficient documentation

## 2015-09-28 DIAGNOSIS — I6523 Occlusion and stenosis of bilateral carotid arteries: Secondary | ICD-10-CM | POA: Diagnosis not present

## 2015-09-28 DIAGNOSIS — I10 Essential (primary) hypertension: Secondary | ICD-10-CM | POA: Diagnosis not present

## 2015-10-03 ENCOUNTER — Telehealth: Payer: Self-pay | Admitting: Cardiology

## 2015-10-03 NOTE — Telephone Encounter (Signed)
New message ° ° ° ° ° °Pt is returning a call to the nurse °

## 2015-10-03 NOTE — Telephone Encounter (Signed)
Reviewed results of carotid doppler with pt who states understanding.  She will repeat in 1 yr as instructed.

## 2015-10-04 DIAGNOSIS — H16229 Keratoconjunctivitis sicca, not specified as Sjogren's, unspecified eye: Secondary | ICD-10-CM | POA: Diagnosis not present

## 2015-10-04 DIAGNOSIS — H04129 Dry eye syndrome of unspecified lacrimal gland: Secondary | ICD-10-CM | POA: Diagnosis not present

## 2015-10-04 DIAGNOSIS — H2511 Age-related nuclear cataract, right eye: Secondary | ICD-10-CM | POA: Diagnosis not present

## 2015-10-04 DIAGNOSIS — H25013 Cortical age-related cataract, bilateral: Secondary | ICD-10-CM | POA: Diagnosis not present

## 2015-10-22 ENCOUNTER — Encounter: Payer: Self-pay | Admitting: Gynecologic Oncology

## 2015-10-22 ENCOUNTER — Ambulatory Visit: Payer: PPO | Attending: Gynecologic Oncology | Admitting: Gynecologic Oncology

## 2015-10-22 VITALS — BP 144/55 | HR 80 | Temp 98.2°F | Resp 18 | Ht 63.5 in | Wt 110.2 lb

## 2015-10-22 DIAGNOSIS — B373 Candidiasis of vulva and vagina: Secondary | ICD-10-CM | POA: Diagnosis not present

## 2015-10-22 DIAGNOSIS — Z08 Encounter for follow-up examination after completed treatment for malignant neoplasm: Secondary | ICD-10-CM | POA: Insufficient documentation

## 2015-10-22 DIAGNOSIS — K529 Noninfective gastroenteritis and colitis, unspecified: Secondary | ICD-10-CM | POA: Diagnosis not present

## 2015-10-22 DIAGNOSIS — D071 Carcinoma in situ of vulva: Secondary | ICD-10-CM

## 2015-10-22 DIAGNOSIS — Z90710 Acquired absence of both cervix and uterus: Secondary | ICD-10-CM | POA: Diagnosis not present

## 2015-10-22 DIAGNOSIS — B3731 Acute candidiasis of vulva and vagina: Secondary | ICD-10-CM | POA: Insufficient documentation

## 2015-10-22 MED ORDER — MICONAZOLE NITRATE 200 MG VA SUPP: 200.0000 mg | Freq: Every day | VAGINAL | Status: DC

## 2015-10-22 NOTE — Patient Instructions (Signed)
Begin using the monistat cream.  Plan to follow up in June or sooner if needed.

## 2015-10-22 NOTE — Progress Notes (Signed)
POSTOPERATIVE FOLLOWUP   Chief complaint: VIN III  Assessment:    70 y.o. 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   S/p vulvar and clitoral laser on 11/03/14. Recurrence on right labia majora. S/p wide local excision on right vulva on 03/08/15.  Vulvovaginal candidiasis  Plan: 1) Monistat suppositories prescribed 2) Followup in 4 months with vulvar colposcopy with acetic acid for evaluation of recurrence of VIN.    HPI:  Regina Ortiz is a 70 y.o. year old who underwent a CO2 laser of the clitoral hood and left labia minora on 11/03/14 for VIN 3 without complications.  Her postoperative course was uncomplicated.  She has multifocal lower genital tract dysplasia, both VIN and VAIN. History is notable for hysterectomy for carcinoma in situ in 1997. She subsequently underwent upper vaginal colpectomies in 1983 and 2000 for vaginal dysplasia. In June 2011, a vulva biopsy demonstrated VIN III. In August 2011, she underwent wide local excision of a right-sided inferior labia majora lesion and CO2 ablation of the right labia minora. She denies any vulvar pruritus or discharge.   Pap 2010, 2011, 2012 2013 wnl. Patient seen 01/2012. Bx c/w VIN III. Patient given trial of Aldara. She stated that it has been difficult to apply Aldara to the external genitalia at the site of the lesions and requested CO2 laser treatment. She underwent Co2 laser ablation of the external genitalia on 06/22/2012.  Pap 03/2013 wnl  Patient underwent excision of an external and intern al hemorrhoid with AIN 04/2013.  Biopsy of left labial lesion on 10/12/14 revealed VINIII.  CO2 laser of clitoral hood and left labia minora (anterior) on 11/03/14.  She was started on humira in the spring, 2016 for her RA and this is also significantly helping her colitis symptoms.  She developed recurrence of VIN3 on right vulva in July 2016, and underwent wide local excision of right vulva on July  25th, 2016. Pathology showed VIN3 involving the 6 o'clock margin.  Interval Hx: she has had 2 weeks of vulvovaginal irritation. It is very consistent with her past history of recurrent vaginal yeast infections. She feels it is the same.   Review of systems: Constitutional:  She has no weight gain or weight loss. She has no fever or chills. Eyes: No blurred vision Ears, Nose, Mouth, Throat: No dizziness, headaches or changes in hearing. No mouth sores. Cardiovascular: No chest pain, palpitations or edema. Respiratory:  No shortness of breath, wheezing or cough Gastrointestinal: She has normal bowel movements without diarrhea or constipation. She denies any nausea or vomiting. She denies blood in her stool or heart burn. Genitourinary:  She denies pelvic pain, pelvic pressure or changes in her urinary function. She has no hematuria, dysuria, or incontinence. She has no irregular vaginal bleeding or vaginal discharge Musculoskeletal: Denies muscle weakness or joint pains.  Skin:  She has no skin changes, rashes or itching Neurological:  Denies dizziness or headaches. No neuropathy, no numbness or tingling. Psychiatric:  She denies depression or anxiety. Hematologic/Lymphatic:   No easy bruising or bleeding   Physical Exam: Blood pressure 144/55, pulse 80, temperature 98.2 F (36.8 C), temperature source Oral, resp. rate 18, height 5' 3.5" (1.613 m), weight 110 lb 3.2 oz (49.986 kg), SpO2 100 %. General: Well dressed, well nourished in no apparent distress.   HEENT:  Normocephalic and atraumatic, no lesions.  Extraocular muscles intact. Sclerae anicteric. Pupils equal, round, reactive. No mouth sores or ulcers. Thyroid is normal size,  not nodular, midline. Skin:  No lesions or rashes. Breasts:  deferred Lungs:  deferred Cardiovascular: deferred Abdomen:  deferred Genitourinary: right vulva incision in tact and healed. No erythema or infection. No leukoplakia. Vagina - slightly erythematous,  no discharge. Extremities: No cyanosis, clubbing or edema.  No calf tenderness or erythema. No palpable cords. Psychiatric: Mood and affect are appropriate. Neurological: Awake, alert and oriented x 3. Sensation is intact, no neuropathy.  Musculoskeletal: No pain, normal strength and range of motion.  Donaciano Eva, MD

## 2015-10-23 DIAGNOSIS — M2550 Pain in unspecified joint: Secondary | ICD-10-CM | POA: Diagnosis not present

## 2015-10-23 DIAGNOSIS — K5289 Other specified noninfective gastroenteritis and colitis: Secondary | ICD-10-CM | POA: Diagnosis not present

## 2015-10-23 DIAGNOSIS — Z79899 Other long term (current) drug therapy: Secondary | ICD-10-CM | POA: Diagnosis not present

## 2015-10-23 DIAGNOSIS — M0609 Rheumatoid arthritis without rheumatoid factor, multiple sites: Secondary | ICD-10-CM | POA: Diagnosis not present

## 2015-10-25 ENCOUNTER — Other Ambulatory Visit: Payer: Self-pay | Admitting: Gynecologic Oncology

## 2015-10-25 DIAGNOSIS — B3731 Acute candidiasis of vulva and vagina: Secondary | ICD-10-CM

## 2015-10-25 DIAGNOSIS — B373 Candidiasis of vulva and vagina: Secondary | ICD-10-CM

## 2015-10-25 MED ORDER — MICONAZOLE NITRATE 200 & 2 MG-% (9GM) VA KIT: 1.0000 | PACK | Freq: Every evening | VAGINAL | Status: DC

## 2015-11-12 ENCOUNTER — Telehealth: Payer: Self-pay

## 2015-11-12 DIAGNOSIS — B373 Candidiasis of vulva and vagina: Secondary | ICD-10-CM

## 2015-11-12 DIAGNOSIS — B3731 Acute candidiasis of vulva and vagina: Secondary | ICD-10-CM

## 2015-11-12 MED ORDER — FLUCONAZOLE 100 MG PO TABS: 150.0000 mg | ORAL_TABLET | Freq: Once | ORAL | Status: DC

## 2015-11-12 NOTE — Telephone Encounter (Signed)
Orders received from [facility] to contact the patient with recommendations for her "yeast" infection. Diflucan 150 MG PO X 1 dose , no refills. Patient contacted and updated with NP recommendations , patient states understanding . Patient denies further questions or concerns at this time , will call with additional changes.

## 2015-12-04 DIAGNOSIS — R197 Diarrhea, unspecified: Secondary | ICD-10-CM | POA: Diagnosis not present

## 2015-12-04 DIAGNOSIS — K5289 Other specified noninfective gastroenteritis and colitis: Secondary | ICD-10-CM | POA: Diagnosis not present

## 2015-12-04 DIAGNOSIS — M0689 Other specified rheumatoid arthritis, multiple sites: Secondary | ICD-10-CM | POA: Diagnosis not present

## 2015-12-04 DIAGNOSIS — K219 Gastro-esophageal reflux disease without esophagitis: Secondary | ICD-10-CM | POA: Diagnosis not present

## 2016-02-04 DIAGNOSIS — K5289 Other specified noninfective gastroenteritis and colitis: Secondary | ICD-10-CM | POA: Diagnosis not present

## 2016-02-07 ENCOUNTER — Ambulatory Visit: Payer: PPO | Attending: Gynecologic Oncology | Admitting: Gynecologic Oncology

## 2016-02-07 ENCOUNTER — Encounter: Payer: Self-pay | Admitting: Gynecologic Oncology

## 2016-02-07 VITALS — BP 184/75 | HR 76 | Temp 98.1°F | Resp 18 | Ht 63.5 in | Wt 103.9 lb

## 2016-02-07 DIAGNOSIS — K529 Noninfective gastroenteritis and colitis, unspecified: Secondary | ICD-10-CM | POA: Diagnosis not present

## 2016-02-07 DIAGNOSIS — Z90710 Acquired absence of both cervix and uterus: Secondary | ICD-10-CM | POA: Diagnosis not present

## 2016-02-07 DIAGNOSIS — I10 Essential (primary) hypertension: Secondary | ICD-10-CM | POA: Insufficient documentation

## 2016-02-07 DIAGNOSIS — B373 Candidiasis of vulva and vagina: Secondary | ICD-10-CM | POA: Insufficient documentation

## 2016-02-07 DIAGNOSIS — D071 Carcinoma in situ of vulva: Secondary | ICD-10-CM | POA: Diagnosis not present

## 2016-02-07 DIAGNOSIS — M069 Rheumatoid arthritis, unspecified: Secondary | ICD-10-CM | POA: Diagnosis not present

## 2016-02-07 DIAGNOSIS — N893 Dysplasia of vagina, unspecified: Secondary | ICD-10-CM | POA: Insufficient documentation

## 2016-02-07 DIAGNOSIS — Z9889 Other specified postprocedural states: Secondary | ICD-10-CM | POA: Diagnosis not present

## 2016-02-07 DIAGNOSIS — B3731 Acute candidiasis of vulva and vagina: Secondary | ICD-10-CM

## 2016-02-07 MED ORDER — MICONAZOLE NITRATE 2 % EX CREA: 1.0000 "application " | TOPICAL_CREAM | Freq: Two times a day (BID) | CUTANEOUS | Status: DC

## 2016-02-07 MED ORDER — FLUCONAZOLE 100 MG PO TABS: 100.0000 mg | ORAL_TABLET | Freq: Once | ORAL | Status: DC

## 2016-02-07 NOTE — Progress Notes (Signed)
GYN ONCOLOGY OFFICE VISIT   Chief complaint: VIN III, vulvovaginal candidiasis  Assessment/Plan:   70 y.o. 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   S/p vulvar and clitoral laser on 11/03/14. Recurrence on right labia majora. S/p wide local excision on right vulva on 03/08/15.  Vulvovaginal candidiasis  1) fluconazole cream to apply to the external genitalia, Diflucan 100 mg by mouth 1  Multifocal genital dysplasia Pap test collected today will perform vaginal colposcopy as indicated by the Pap.     Advised to take Diflucan tablet one week prior since this is a second tie level colposcopies been delayed because of candidiasis follow-up 02/28/2016 for vulvar colposcopy  Hypertension Follow-up with PCP  HPI:  Regina Ortiz is a 70 y.o. year old who underwent a CO2 laser of the clitoral hood and left labia minora on 11/03/14 for VIN 3 without complications.  Her postoperative course was uncomplicated.  She has multifocal lower genital tract dysplasia, both VIN and VAIN. History is notable for hysterectomy for carcinoma in situ in 1997. She subsequently underwent upper vaginal colpectomies in 1983 and 2000 for vaginal dysplasia. In June 2011, a vulva biopsy demonstrated VIN III. In August 2011, she underwent wide local excision of a right-sided inferior labia majora lesion and CO2 ablation of the right labia minora. She denies any vulvar pruritus or discharge.   Pap 2010, 2011, 2012 2013 wnl. Patient seen 01/2012. Bx c/w VIN III. Patient given trial of Aldara. She stated that it has been difficult to apply Aldara to the external genitalia at the site of the lesions and requested CO2 laser treatment. She underwent Co2 laser ablation of the external genitalia on 06/22/2012.  Pap 03/2013 wnl  Patient underwent excision of an external and intern al hemorrhoid with AIN 04/2013.  Biopsy of left labial lesion on 10/12/14 revealed VINIII.  CO2 laser of clitoral hood  and left labia minora (anterior) on 11/03/14.  She was started on humira in the spring, 2016 for her RA and this is also significantly helping her colitis symptoms.  She developed recurrence of VIN3 on right vulva in July 2016, and underwent wide local excision of right vulva on July 25th, 2016. Pathology showed VIN3 involving the 6 o'clock margin.  Interval Hx: reports vulvovaginal candidiasis for the last week. Has applied vaginal Monistat to the external genitalia with a small amount of relief. Reports significant discomfort with sitting and with the pressure of underwear over the external genitalia  Social history: Husband diagnosed with rectal cancer. He is previously a trucker was spent most of his time away from the household. There is a significant amount of stress beats we in Mr. and Mrs. Setzler as a result of spending a lot of time together and the significant comorbid illness.  Review of systems: Constitutional:  She has no weight gain or weight loss. She has no fever or chills. Denies headache Eyes: No blurred vision Ears, Nose, Mouth, Throat: No dizziness, headaches or changes in hearing. No mouth sores. Cardiovascular: No chest pain, palpitations or edema. Denies chest pain Respiratory:  No shortness of breath, wheezing or cough Gastrointestinal: She has normal bowel movements without diarrhea or constipation.  Genitourinary:  She denies pelvic pain. She has no hematuria, dysuria, or incontinence. She has no irregular vaginal bleeding or vaginal discharge Musculoskeletal: Denies muscle weakness or joint pains.  Skin:  She has no skin changes, rashes or itching Neurological:  Denies dizziness or headaches. No neuropathy, no numbness or tingling.  Psychiatric:  She denies depression or anxiety. Hematologic/Lymphatic:   No easy bruising or bleeding   Physical Exam: Blood pressure 184/75, pulse 76, temperature 98.1 F (36.7 C), temperature source Oral, resp. rate 18, height 5' 3.5"  (1.613 m), weight 103 lb 14.4 oz (47.129 kg), SpO2 100 %. General: Well dressed, well nourished in no apparent distress.   Genitourinary: Candidiasis of the external genitalia entirely encompassing the labia majora and extending on to the inner thigh.  Vagina - slightly erythematous, no discharge, atrophic. Pap collected  Extremities: No cyanosis, clubbing or edema.  No calf tenderness or erythema. No palpable cords. Psychiatric: Mood and affect are appropriate. Musculoskeletal: No pain, normal strength and range of motion.

## 2016-02-07 NOTE — Patient Instructions (Signed)
Plan to follow up with Dr. Skeet Latch on June 29.  We will send in diflucan tablets for you: take one now and then the other one week before seeing Dr. Skeet Latch.  We will also call in the external cream for your vulva.  We will call you with the results of your pap smear from today but please call our office for any questions or concerns.  Fluconazole tablets What is this medicine? FLUCONAZOLE (floo KON na zole) is an antifungal medicine. It is used to treat certain kinds of fungal or yeast infections. This medicine may be used for other purposes; ask your health care provider or pharmacist if you have questions. What should I tell my health care provider before I take this medicine? They need to know if you have any of these conditions: -electrolyte abnormalities -history of irregular heart beat -kidney disease -an unusual or allergic reaction to fluconazole, other azole antifungals, medicines, foods, dyes, or preservatives -pregnant or trying to get pregnant -breast-feeding How should I use this medicine? Take this medicine by mouth. Follow the directions on the prescription label. Do not take your medicine more often than directed. Talk to your pediatrician regarding the use of this medicine in children. Special care may be needed. This medicine has been used in children as young as 65 months of age. Overdosage: If you think you have taken too much of this medicine contact a poison control center or emergency room at once. NOTE: This medicine is only for you. Do not share this medicine with others. What if I miss a dose? If you miss a dose, take it as soon as you can. If it is almost time for your next dose, take only that dose. Do not take double or extra doses. What may interact with this medicine? Do not take this medicine with any of the following medications: -astemizole -certain medicines for irregular heart beat like dofetilide, dronedarone,  quinidine -cisapride -erythromycin -lomitapide -other medicines that prolong the QT interval (cause an abnormal heart rhythm) -pimozide -terfenadine -thioridazine -tolvaptan -ziprasidone This medicine may also interact with the following medications: -antiviral medicines for HIV or AIDS -birth control pills -certain antibiotics like rifabutin, rifampin -certain medicines for blood pressure like amlodipine, isradipine, felodipine, hydrochlorothiazide, losartan, nifedipine -certain medicines for cancer like cyclophosphamide, vinblastine, vincristine -certain medicines for cholesterol like atorvastatin, lovastatin, fluvastatin, simvastatin -certain medicines for depression, anxiety, or psychotic disturbances like amitriptyline, midazolam, nortriptyline, triazolam -certain medicines for diabetes like glipizide, glyburide, tolbutamide -certain medicines for pain like alfentanil, fentanyl, methadone -certain medicines for seizures like carbamazepine, phenytoin -certain medicines that treat or prevent blood clots like warfarin -halofantrine -medicines that lower your chance of fighting infection like cyclosporine, prednisone, tacrolimus -NSAIDS, medicines for pain and inflammation, like celecoxib, diclofenac, flurbiprofen, ibuprofen, meloxicam, naproxen -other medicines for fungal infections -sirolimus -theophylline -tofacitinib This list may not describe all possible interactions. Give your health care provider a list of all the medicines, herbs, non-prescription drugs, or dietary supplements you use. Also tell them if you smoke, drink alcohol, or use illegal drugs. Some items may interact with your medicine. What should I watch for while using this medicine? Visit your doctor or health care professional for regular checkups. If you are taking this medicine for a long time you may need blood work. Tell your doctor if your symptoms do not improve. Some fungal infections need many weeks or  months of treatment to cure. Alcohol can increase possible damage to your liver. Avoid alcoholic drinks. If you have a  vaginal infection, do not have sex until you have finished your treatment. You can wear a sanitary napkin. Do not use tampons. Wear freshly washed cotton, not synthetic, panties. What side effects may I notice from receiving this medicine? Side effects that you should report to your doctor or health care professional as soon as possible: -allergic reactions like skin rash or itching, hives, swelling of the lips, mouth, tongue, or throat -dark urine -feeling dizzy or faint -irregular heartbeat or chest pain -redness, blistering, peeling or loosening of the skin, including inside the mouth -trouble breathing -unusual bruising or bleeding -vomiting -yellowing of the eyes or skin Side effects that usually do not require medical attention (report to your doctor or health care professional if they continue or are bothersome): -changes in how food tastes -diarrhea -headache -stomach upset or nausea This list may not describe all possible side effects. Call your doctor for medical advice about side effects. You may report side effects to FDA at 1-800-FDA-1088. Where should I keep my medicine? Keep out of the reach of children. Store at room temperature below 30 degrees C (86 degrees F). Throw away any medicine after the expiration date. NOTE: This sheet is a summary. It may not cover all possible information. If you have questions about this medicine, talk to your doctor, pharmacist, or health care provider.    2016, [employer] Standard. (2013-03-26 16:13:04)

## 2016-02-11 ENCOUNTER — Ambulatory Visit: Payer: Medicare Other | Admitting: Gynecologic Oncology

## 2016-02-12 LAB — CYTOLOGY - PAP

## 2016-02-13 ENCOUNTER — Telehealth: Payer: Self-pay

## 2016-02-13 NOTE — Telephone Encounter (Signed)
Orders received from [place], APNP to contact the patient to update with PAP results are "normal" collect on 02/07/2016 during visit with Dr Janie Morning. Attempted to reach the patient , no answer , left a detailed message , call back information was provided if additional questions arise.

## 2016-02-26 DIAGNOSIS — J441 Chronic obstructive pulmonary disease with (acute) exacerbation: Secondary | ICD-10-CM | POA: Diagnosis not present

## 2016-02-26 DIAGNOSIS — F17200 Nicotine dependence, unspecified, uncomplicated: Secondary | ICD-10-CM | POA: Diagnosis not present

## 2016-02-26 DIAGNOSIS — F411 Generalized anxiety disorder: Secondary | ICD-10-CM | POA: Diagnosis not present

## 2016-02-26 DIAGNOSIS — I10 Essential (primary) hypertension: Secondary | ICD-10-CM | POA: Diagnosis not present

## 2016-02-26 DIAGNOSIS — F331 Major depressive disorder, recurrent, moderate: Secondary | ICD-10-CM | POA: Diagnosis not present

## 2016-02-28 ENCOUNTER — Ambulatory Visit: Payer: PPO | Admitting: Gynecologic Oncology

## 2016-03-17 ENCOUNTER — Other Ambulatory Visit: Payer: Self-pay | Admitting: Gynecologic Oncology

## 2016-03-17 DIAGNOSIS — B373 Candidiasis of vulva and vagina: Secondary | ICD-10-CM

## 2016-03-17 DIAGNOSIS — B3731 Acute candidiasis of vulva and vagina: Secondary | ICD-10-CM

## 2016-03-17 MED ORDER — FLUCONAZOLE 100 MG PO TABS: 100.0000 mg | ORAL_TABLET | Freq: Once | ORAL | Status: DC

## 2016-03-17 NOTE — Progress Notes (Signed)
Patient called stating she needs a refill since her yeast infection is back.  Refill sent in.

## 2016-03-20 ENCOUNTER — Ambulatory Visit: Payer: PPO | Admitting: Gynecologic Oncology

## 2016-04-28 ENCOUNTER — Other Ambulatory Visit: Payer: Self-pay | Admitting: Family Medicine

## 2016-04-28 DIAGNOSIS — Z1231 Encounter for screening mammogram for malignant neoplasm of breast: Secondary | ICD-10-CM

## 2016-05-01 ENCOUNTER — Encounter: Payer: Self-pay | Admitting: Gynecologic Oncology

## 2016-05-01 ENCOUNTER — Ambulatory Visit: Payer: PPO | Attending: Gynecologic Oncology | Admitting: Gynecologic Oncology

## 2016-05-01 VITALS — BP 179/75 | HR 82 | Temp 98.4°F | Resp 18 | Wt 104.0 lb

## 2016-05-01 DIAGNOSIS — D071 Carcinoma in situ of vulva: Secondary | ICD-10-CM | POA: Insufficient documentation

## 2016-05-01 DIAGNOSIS — I10 Essential (primary) hypertension: Secondary | ICD-10-CM | POA: Diagnosis not present

## 2016-05-01 DIAGNOSIS — B373 Candidiasis of vulva and vagina: Secondary | ICD-10-CM

## 2016-05-01 DIAGNOSIS — K529 Noninfective gastroenteritis and colitis, unspecified: Secondary | ICD-10-CM | POA: Insufficient documentation

## 2016-05-01 DIAGNOSIS — K6282 Dysplasia of anus: Secondary | ICD-10-CM

## 2016-05-01 DIAGNOSIS — B3731 Acute candidiasis of vulva and vagina: Secondary | ICD-10-CM

## 2016-05-01 DIAGNOSIS — M069 Rheumatoid arthritis, unspecified: Secondary | ICD-10-CM | POA: Diagnosis not present

## 2016-05-01 NOTE — Progress Notes (Signed)
GYN ONCOLOGY OFFICE VISIT   Chief complaint: VIN III,    Assessment/Plan:   70 y.o. 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   S/p vulvar and clitoral laser on 11/03/14. Recurrence on right labia majora. S/p wide local excision on right vulva on 03/08/15. Multifocal genital dysplasia Vulva colposcopy consistent with this multifocal acetowhite changes in the peritoneum. A biopsy was collected We will contact her with results this is likely recurrence of VIN will treat with CO2 laser if this is confirmed   Hypertension Follow-up with PCP  HPI:  Regina Ortiz is a 70 y.o. year old who underwent a CO2 laser of the clitoral hood and left labia minora on 11/03/14 for VIN 3 without complications.  Her postoperative course was uncomplicated.  She has multifocal lower genital tract dysplasia, both VIN and VAIN. History is notable for hysterectomy for carcinoma in situ in 1997. She subsequently underwent upper vaginal colpectomies in 1983 and 2000 for vaginal dysplasia. In June 2011, a vulva biopsy demonstrated VIN III. In August 2011, she underwent wide local excision of a right-sided inferior labia majora lesion and CO2 ablation of the right labia minora. She denies any vulvar pruritus or discharge.   Pap 2010, 2011, 2012 2013 wnl. Patient seen 01/2012. Bx c/w VIN III. Patient given trial of Aldara. She stated that it has been difficult to apply Aldara to the external genitalia at the site of the lesions and requested CO2 laser treatment. She underwent Co2 laser ablation of the external genitalia on 06/22/2012.    Patient underwent excision of an external and intern al hemorrhoid with AIN 04/2013.  Biopsy of left labial lesion on 10/12/14 revealed VINIII.  CO2 laser of clitoral hood and left labia minora (anterior) on 11/03/14.  She was started on humira in the spring, 2016 for her RA and this is also significantly helping her colitis symptoms.  She developed  recurrence of VIN3 on right vulva in July 2016, and underwent wide local excision of right vulva on July 25th, 2016. Pathology showed VIN3 involving the 6 o'clock margin.  Pap 01/2016 wnl  Interval Hx: reports vulvovaginal candidiasis for the last week. Has applied vaginal Monistat to the external genitalia with a small amount of relief. Reports significant discomfort with sitting and with the pressure of underwear over the external genitalia  Social history: Husband diagnosed with rectal cancer and recent psychiatric episode that required hospitalization. He is previously a trucker was spent most of his time away from the household. There is a significant amount of stress beats we in Mr. and Mrs. Goettel as a result of spending a lot of time together and the significant comorbid illness.  Review of systems: Constitutional:  She has no weight gain or weight loss. She has no fever or chills. Denies headache Eyes: No blurred vision Ears, Nose, Mouth, Throat: No dizziness, headaches or changes in hearing. No mouth sores. Cardiovascular: No chest pain, palpitations or edema. Denies chest pain Respiratory:  No shortness of breath, wheezing or cough Gastrointestinal: She has normal bowel movements without diarrhea or constipation.  Genitourinary:  She denies pelvic pain. She has no hematuria, dysuria, or incontinence. She has no irregular vaginal bleeding or vaginal discharge Musculoskeletal: Denies muscle weakness or joint pains.  Skin:  She has no skin changes, rashes or itching Neurological:  Denies dizziness or headaches. No neuropathy, no numbness or tingling. Psychiatric:  She denies depression or anxiety. Hematologic/Lymphatic:   No easy bruising or bleeding  Physical Exam: Blood pressure (!) 179/75, pulse 82, temperature 98.4 F (36.9 C), temperature source Oral, resp. rate 18, weight 104 lb (47.2 kg), SpO2 100 %. General: Well dressed, well nourished in no apparent distress.    Genitourinary: Candidiasis of the external genitalia entirely encompassing the labia majora and extending on to the inner thigh.  Vagina - slightly erythematous, no discharge, atrophic. Acetic is acid applied to the external genitalia and the posterior fourchette notable for multifocal areas of acetowhite changes noted on colposcopic examination.  VULVAR BIOPSY  Procedure explained to patient.  The perineal area was prepped with betadine.  The area was infiltrated with lidocaine.  A biopsy was taken from the left aspect of the perineum*.  There was minimal bleeding that was  controlled with silver nitrate and pressure for 2 minutes.  . Patient tolerated the procedure well.   Extremities: No cyanosis, clubbing or edema.  No calf tenderness or erythema. No palpable cords. Psychiatric: Mood and affect are appropriate. Musculoskeletal: No pain, normal strength and range of motion.

## 2016-05-01 NOTE — Addendum Note (Signed)
Addended by: Elizebeth Koller on: 05/01/2016 04:37 PM   Modules accepted: Orders

## 2016-05-01 NOTE — Patient Instructions (Addendum)
We will contact you with the results from your biopsy to discuss Dr Dwain Sarna recommendations . Please call with any changes , questions or concerns.  Thank you !

## 2016-05-07 ENCOUNTER — Telehealth: Payer: Self-pay | Admitting: Gynecologic Oncology

## 2016-05-07 NOTE — Telephone Encounter (Signed)
Called and informed patient of biopsy.  Advised patient that there was an opening tomorrow on Dr. Leone Brand OR schedule but she states she would rather wait and would prefer to schedule this on Sept 28 when Dr. Skeet Latch is here again.  She states she was moderately sore from the biopsy and is treating some mild external yeast as well.  Patient advised we would schedule her for that day and she would receive a call from the [facility] closer to the date.  Advised to call for any needs or concerns.

## 2016-05-20 DIAGNOSIS — M0609 Rheumatoid arthritis without rheumatoid factor, multiple sites: Secondary | ICD-10-CM | POA: Diagnosis not present

## 2016-05-20 DIAGNOSIS — Z79899 Other long term (current) drug therapy: Secondary | ICD-10-CM | POA: Diagnosis not present

## 2016-05-20 DIAGNOSIS — M2550 Pain in unspecified joint: Secondary | ICD-10-CM | POA: Diagnosis not present

## 2016-05-20 DIAGNOSIS — K5289 Other specified noninfective gastroenteritis and colitis: Secondary | ICD-10-CM | POA: Diagnosis not present

## 2016-05-27 ENCOUNTER — Encounter (HOSPITAL_BASED_OUTPATIENT_CLINIC_OR_DEPARTMENT_OTHER): Payer: Self-pay | Admitting: *Deleted

## 2016-05-27 NOTE — Progress Notes (Signed)
NPO AFTER MN.  ARRIVE AT 0715.  NEEDS ISTAT.  CURRENT EKG IN CHART AND EPIC.  WILL TAKE AM MEDS W/ SIPS OF WATER DOS.

## 2016-05-29 ENCOUNTER — Ambulatory Visit (HOSPITAL_BASED_OUTPATIENT_CLINIC_OR_DEPARTMENT_OTHER): Payer: PPO | Admitting: Anesthesiology

## 2016-05-29 ENCOUNTER — Encounter (HOSPITAL_BASED_OUTPATIENT_CLINIC_OR_DEPARTMENT_OTHER)
Admission: RE | Disposition: A | Discharge status: Home / Self Care | Payer: Self-pay | Source: Ambulatory Visit | Attending: Gynecologic Oncology

## 2016-05-29 ENCOUNTER — Ambulatory Visit (HOSPITAL_BASED_OUTPATIENT_CLINIC_OR_DEPARTMENT_OTHER)
Admission: RE | Admit: 2016-05-29 | Discharge: 2016-05-29 | Disposition: A | Discharge status: Home / Self Care | Payer: PPO | Source: Ambulatory Visit | Attending: Gynecologic Oncology | Admitting: Gynecologic Oncology

## 2016-05-29 ENCOUNTER — Encounter (HOSPITAL_BASED_OUTPATIENT_CLINIC_OR_DEPARTMENT_OTHER): Payer: Self-pay | Admitting: *Deleted

## 2016-05-29 DIAGNOSIS — J449 Chronic obstructive pulmonary disease, unspecified: Secondary | ICD-10-CM | POA: Insufficient documentation

## 2016-05-29 DIAGNOSIS — N289 Disorder of kidney and ureter, unspecified: Secondary | ICD-10-CM | POA: Insufficient documentation

## 2016-05-29 DIAGNOSIS — I10 Essential (primary) hypertension: Secondary | ICD-10-CM | POA: Diagnosis not present

## 2016-05-29 DIAGNOSIS — D649 Anemia, unspecified: Secondary | ICD-10-CM | POA: Diagnosis not present

## 2016-05-29 DIAGNOSIS — F17200 Nicotine dependence, unspecified, uncomplicated: Secondary | ICD-10-CM | POA: Insufficient documentation

## 2016-05-29 DIAGNOSIS — Z90710 Acquired absence of both cervix and uterus: Secondary | ICD-10-CM | POA: Insufficient documentation

## 2016-05-29 DIAGNOSIS — K219 Gastro-esophageal reflux disease without esophagitis: Secondary | ICD-10-CM | POA: Diagnosis not present

## 2016-05-29 DIAGNOSIS — I739 Peripheral vascular disease, unspecified: Secondary | ICD-10-CM | POA: Insufficient documentation

## 2016-05-29 DIAGNOSIS — K529 Noninfective gastroenteritis and colitis, unspecified: Secondary | ICD-10-CM | POA: Insufficient documentation

## 2016-05-29 DIAGNOSIS — K644 Residual hemorrhoidal skin tags: Secondary | ICD-10-CM | POA: Diagnosis not present

## 2016-05-29 DIAGNOSIS — D071 Carcinoma in situ of vulva: Secondary | ICD-10-CM | POA: Insufficient documentation

## 2016-05-29 DIAGNOSIS — I129 Hypertensive chronic kidney disease with stage 1 through stage 4 chronic kidney disease, or unspecified chronic kidney disease: Secondary | ICD-10-CM | POA: Diagnosis not present

## 2016-05-29 DIAGNOSIS — M069 Rheumatoid arthritis, unspecified: Secondary | ICD-10-CM | POA: Insufficient documentation

## 2016-05-29 DIAGNOSIS — R0602 Shortness of breath: Secondary | ICD-10-CM | POA: Diagnosis not present

## 2016-05-29 DIAGNOSIS — N909 Noninflammatory disorder of vulva and perineum, unspecified: Secondary | ICD-10-CM | POA: Diagnosis not present

## 2016-05-29 HISTORY — PX: CO2 LASER APPLICATION: SHX5778

## 2016-05-29 HISTORY — DX: Personal history of vulvar dysplasia: Z87.412

## 2016-05-29 HISTORY — DX: Dysplasia of anus: K62.82

## 2016-05-29 LAB — POCT I-STAT 4, (NA,K, GLUC, HGB,HCT)
GLUCOSE: 104 mg/dL — AB (ref 65–99)
HEMATOCRIT: 34 % — AB (ref 36.0–46.0)
Hemoglobin: 11.6 g/dL — ABNORMAL LOW (ref 12.0–15.0)
Potassium: 4.4 mmol/L (ref 3.5–5.1)
SODIUM: 131 mmol/L — AB (ref 135–145)

## 2016-05-29 SURGERY — CO2 LASER APPLICATION
Anesthesia: General | Site: Vulva

## 2016-05-29 MED ORDER — LIDOCAINE 2% (20 MG/ML) 5 ML SYRINGE
INTRAMUSCULAR | Status: DC | PRN
  Administered 2016-05-29: 60 mg via INTRAVENOUS

## 2016-05-29 MED ORDER — IBUPROFEN 800 MG PO TABS: 800.0000 mg | ORAL_TABLET | Freq: Three times a day (TID) | ORAL | 0 refills | Status: AC | PRN

## 2016-05-29 MED ORDER — FENTANYL CITRATE (PF) 100 MCG/2ML IJ SOLN
INTRAMUSCULAR | Status: AC
  Filled 2016-05-29: qty 2

## 2016-05-29 MED ORDER — PROPOFOL 10 MG/ML IV BOLUS
INTRAVENOUS | Status: DC | PRN
  Administered 2016-05-29: 100 mg via INTRAVENOUS
  Administered 2016-05-29: 20 mg via INTRAVENOUS

## 2016-05-29 MED ORDER — KETOROLAC TROMETHAMINE 30 MG/ML IJ SOLN
INTRAMUSCULAR | Status: AC
  Filled 2016-05-29: qty 3

## 2016-05-29 MED ORDER — STERILE WATER FOR IRRIGATION IR SOLN
Status: DC | PRN
  Administered 2016-05-29: 1000 mL

## 2016-05-29 MED ORDER — PROMETHAZINE HCL 25 MG/ML IJ SOLN
6.2500 mg | INTRAMUSCULAR | Status: DC | PRN
  Filled 2016-05-29: qty 1

## 2016-05-29 MED ORDER — FENTANYL CITRATE (PF) 100 MCG/2ML IJ SOLN
INTRAMUSCULAR | Status: DC | PRN
  Administered 2016-05-29: 25 ug via INTRAVENOUS
  Administered 2016-05-29: 50 ug via INTRAVENOUS
  Administered 2016-05-29: 25 ug via INTRAVENOUS

## 2016-05-29 MED ORDER — LACTATED RINGERS IV SOLN
INTRAVENOUS | Status: DC
  Administered 2016-05-29: 08:00:00 via INTRAVENOUS
  Filled 2016-05-29: qty 1000

## 2016-05-29 MED ORDER — SILVER SULFADIAZINE 1 % EX CREA
TOPICAL_CREAM | CUTANEOUS | Status: DC | PRN
  Administered 2016-05-29: 1 via TOPICAL

## 2016-05-29 MED ORDER — PROPOFOL 10 MG/ML IV BOLUS
INTRAVENOUS | Status: AC
  Filled 2016-05-29: qty 20

## 2016-05-29 MED ORDER — ONDANSETRON HCL 4 MG/2ML IJ SOLN
INTRAMUSCULAR | Status: DC | PRN
  Administered 2016-05-29: 4 mg via INTRAVENOUS

## 2016-05-29 MED ORDER — DEXAMETHASONE SODIUM PHOSPHATE 10 MG/ML IJ SOLN
INTRAMUSCULAR | Status: AC
  Filled 2016-05-29: qty 1

## 2016-05-29 MED ORDER — ONDANSETRON HCL 4 MG/2ML IJ SOLN
INTRAMUSCULAR | Status: AC
  Filled 2016-05-29: qty 2

## 2016-05-29 MED ORDER — LIDOCAINE HCL (CARDIAC) 20 MG/ML IV SOLN: INTRAVENOUS | Status: DC | PRN

## 2016-05-29 MED ORDER — ACETIC ACID 5 % SOLN
Status: DC | PRN
  Administered 2016-05-29 (×2): 1 via TOPICAL

## 2016-05-29 MED ORDER — DEXAMETHASONE SODIUM PHOSPHATE 4 MG/ML IJ SOLN
INTRAMUSCULAR | Status: DC | PRN
  Administered 2016-05-29: 10 mg via INTRAVENOUS

## 2016-05-29 MED ORDER — FENTANYL CITRATE (PF) 100 MCG/2ML IJ SOLN
25.0000 ug | INTRAMUSCULAR | Status: DC | PRN
  Administered 2016-05-29 (×2): 25 ug via INTRAVENOUS
  Administered 2016-05-29: 50 ug via INTRAVENOUS
  Administered 2016-05-29: 25 ug via INTRAVENOUS
  Filled 2016-05-29: qty 1

## 2016-05-29 SURGICAL SUPPLY — 51 items
APPLICATOR COTTON TIP 6IN STRL (MISCELLANEOUS) ×6 IMPLANT
BAG DECANTER FOR FLEXI CONT (MISCELLANEOUS) ×3 IMPLANT
BLADE SURG 11 STRL SS (BLADE) IMPLANT
BLADE SURG 15 STRL LF DISP TIS (BLADE) IMPLANT
BLADE SURG 15 STRL SS (BLADE)
CANISTER SUCTION 1200CC (MISCELLANEOUS) IMPLANT
CANISTER SUCTION 2500CC (MISCELLANEOUS) IMPLANT
CATH FOLEY 2WAY  5CC 16FR SIL (CATHETERS) ×2
CATH FOLEY 2WAY 5CC 16FR SIL (CATHETERS) ×1 IMPLANT
DRAPE LG THREE QUARTER DISP (DRAPES) IMPLANT
DRAPE UNDERBUTTOCKS STRL (DRAPE) IMPLANT
DRSG TELFA 3X8 NADH (GAUZE/BANDAGES/DRESSINGS) ×3 IMPLANT
ELECT BALL LEEP 3MM BLK (ELECTRODE) IMPLANT
ELECT BALL LEEP 5MM RED (ELECTRODE) IMPLANT
ELECT NEEDLE TIP 2.8 STRL (NEEDLE) IMPLANT
ELECT REM PT RETURN 9FT ADLT (ELECTROSURGICAL)
ELECTRODE REM PT RTRN 9FT ADLT (ELECTROSURGICAL) IMPLANT
GAUZE SPONGE 4X4 16PLY XRAY LF (GAUZE/BANDAGES/DRESSINGS) ×3 IMPLANT
GLOVE ECLIPSE 7.5 STRL STRAW (GLOVE) ×3 IMPLANT
GLOVE ECLIPSE 8.0 STRL XLNG CF (GLOVE) ×3 IMPLANT
GLOVE INDICATOR 6.5 STRL GRN (GLOVE) ×3 IMPLANT
GLOVE INDICATOR 7.0 STRL GRN (GLOVE) ×3 IMPLANT
GLOVE INDICATOR 7.5 STRL GRN (GLOVE) ×3 IMPLANT
GOWN STRL REUS W/ TWL LRG LVL3 (GOWN DISPOSABLE) ×1 IMPLANT
GOWN STRL REUS W/ TWL XL LVL3 (GOWN DISPOSABLE) ×1 IMPLANT
GOWN STRL REUS W/TWL LRG LVL3 (GOWN DISPOSABLE) ×2
GOWN STRL REUS W/TWL XL LVL3 (GOWN DISPOSABLE) ×2
KIT ROOM TURNOVER WOR (KITS) ×3 IMPLANT
LEGGING LITHOTOMY PAIR STRL (DRAPES) IMPLANT
NDL SAFETY ECLIPSE 18X1.5 (NEEDLE) ×1 IMPLANT
NEEDLE HYPO 18GX1.5 SHARP (NEEDLE) ×2
NEEDLE SPNL 22GX3.5 QUINCKE BK (NEEDLE) ×3 IMPLANT
NS IRRIG 500ML POUR BTL (IV SOLUTION) IMPLANT
PACK BASIN DAY SURGERY FS (CUSTOM PROCEDURE TRAY) ×3 IMPLANT
PAD OB MATERNITY 4.3X12.25 (PERSONAL CARE ITEMS) ×3 IMPLANT
PAD PREP 24X48 CUFFED NSTRL (MISCELLANEOUS) ×3 IMPLANT
PENCIL BUTTON HOLSTER BLD 10FT (ELECTRODE) IMPLANT
SCOPETTES 8  STERILE (MISCELLANEOUS)
SCOPETTES 8 STERILE (MISCELLANEOUS) IMPLANT
SUT VIC AB 2-0 SH 27 (SUTURE) ×6
SUT VIC AB 2-0 SH 27XBRD (SUTURE) ×2 IMPLANT
SYR CONTROL 10ML LL (SYRINGE) ×3 IMPLANT
SYRINGE LUER LOK 1CC (MISCELLANEOUS) ×3 IMPLANT
TOWEL OR 17X24 6PK STRL BLUE (TOWEL DISPOSABLE) ×6 IMPLANT
TRAY DSU PREP LF (CUSTOM PROCEDURE TRAY) IMPLANT
TUBE CONNECTING 12'X1/4 (SUCTIONS) ×1
TUBE CONNECTING 12X1/4 (SUCTIONS) ×2 IMPLANT
VACUUM HOSE 7/8X10 W/ WAND (MISCELLANEOUS) IMPLANT
VACUUM HOSE/TUBING 7/8INX6FT (MISCELLANEOUS) IMPLANT
WATER STERILE IRR 500ML POUR (IV SOLUTION) ×3 IMPLANT
YANKAUER SUCT BULB TIP NO VENT (SUCTIONS) IMPLANT

## 2016-05-29 NOTE — Interval H&P Note (Signed)
History and Physical Interval Note:  05/29/2016 8:31 AM  Regina Ortiz  has presented today for surgery, with the diagnosis of vulva neoplasia  The various methods of treatment have been discussed with the patient and family. After consideration of risks, benefits and other options for treatment, the patient has consented to  Procedure(s): CO2 LASER APPLICATION of the vulva (N/A) as a surgical intervention .  The patient's history has been reviewed, patient examined, no change in status, stable for surgery.  I have reviewed the patient's chart and labs.  Questions were answered to the patient's satisfaction.     [place], [facility]

## 2016-05-29 NOTE — Anesthesia Procedure Notes (Signed)
Procedure Name: LMA Insertion Date/Time: 05/29/2016 8:47 AM Performed by: Franne Grip Pre-anesthesia Checklist: Patient identified, Emergency Drugs available, Suction available and Patient being monitored Patient Re-evaluated:Patient Re-evaluated prior to inductionOxygen Delivery Method: Circle system utilized Preoxygenation: Pre-oxygenation with 100% oxygen Intubation Type: IV induction Ventilation: Mask ventilation without difficulty LMA: LMA inserted LMA Size: 3.0 Number of attempts: 1 Airway Equipment and Method: Bite block Placement Confirmation: positive ETCO2 Tube secured with: Tape Dental Injury: Teeth and Oropharynx as per pre-operative assessment

## 2016-05-29 NOTE — Anesthesia Postprocedure Evaluation (Signed)
Anesthesia Post Note  Patient: Regina Ortiz  Procedure(s) Performed: Procedure(s) (LRB): CO2 LASER APPLICATION of the vulva (N/A)  Patient location during evaluation: PACU Anesthesia Type: General Level of consciousness: awake and alert Pain management: pain level controlled Vital Signs Assessment: post-procedure vital signs reviewed and stable Respiratory status: spontaneous breathing, nonlabored ventilation, respiratory function stable and patient connected to nasal cannula oxygen Cardiovascular status: blood pressure returned to baseline and stable Postop Assessment: no signs of nausea or vomiting Anesthetic complications: no    Last Vitals:  Vitals:   05/29/16 0945 05/29/16 1000  BP: (!) 152/70 (!) 160/68  Pulse: 72 74  Resp: (!) 9 11  Temp:      Last Pain:  Vitals:   05/29/16 1025  TempSrc:   PainSc: 6                  Knute Mazzuca J

## 2016-05-29 NOTE — Transfer of Care (Signed)
  Vitals:   05/29/16 0713 05/29/16 0926  BP: (!) 186/78 (!) 169/77  Pulse: 84 82  Resp: 16 (!) 9  Temp: 36.5 C     Last Pain:  Vitals:   05/29/16 0713  TempSrc: Oral      Patients Stated Pain Goal: 5 (05/29/16 0753)  Immediate Anesthesia Transfer of Care Note  Patient: Regina Ortiz  Procedure(s) Performed: Procedure(s) (LRB): CO2 LASER APPLICATION of the vulva (N/A)  Patient Location: PACU  Anesthesia Type: General  Level of Consciousness: awake, alert  and oriented  Airway & Oxygen Therapy: Patient Spontanous Breathing and Patient connected to nasal cannula oxygen  Post-op Assessment: Report given to PACU RN and Post -op Vital signs reviewed and stable  Post vital signs: Reviewed and stable  Complications: No apparent anesthesia complications

## 2016-05-29 NOTE — Anesthesia Preprocedure Evaluation (Addendum)
Anesthesia Evaluation  Patient identified by MRN, date of birth, ID band Patient awake    Reviewed: Allergy & Precautions, NPO status , Patient's Chart, lab work & pertinent test results  Airway Mallampati: II  TM Distance: >3 FB Neck ROM: Full    Dental  (+) Edentulous Upper   Pulmonary shortness of breath, COPD, Current Smoker,    Pulmonary exam normal breath sounds clear to auscultation       Cardiovascular hypertension, Pt. on medications + Peripheral Vascular Disease  Normal cardiovascular exam+ dysrhythmias  Rhythm:Regular Rate:Normal  ECHO 2013: EF 55-60%   Neuro/Psych PSYCHIATRIC DISORDERS Anxiety Depression negative neurological ROS     GI/Hepatic Neg liver ROS, GERD  Medicated,  Endo/Other  negative endocrine ROS  Renal/GU Renal disease  negative genitourinary   Musculoskeletal  (+) Arthritis ,   Abdominal   Peds negative pediatric ROS (+)  Hematology  (+) anemia ,   Anesthesia Other Findings   Reproductive/Obstetrics negative OB ROS                            Anesthesia Physical Anesthesia Plan  ASA: III  Anesthesia Plan: General   Post-op Pain Management:    Induction: Intravenous  Airway Management Planned: LMA  Additional Equipment:   Intra-op Plan:   Post-operative Plan: Extubation in OR  Informed Consent: I have reviewed the patients History and Physical, chart, labs and discussed the procedure including the risks, benefits and alternatives for the proposed anesthesia with the patient or authorized representative who has indicated his/her understanding and acceptance.   Dental advisory given  Plan Discussed with: CRNA  Anesthesia Plan Comments: (LMA #3 last time.)       Anesthesia Quick Evaluation

## 2016-05-29 NOTE — H&P (View-Only) (Signed)
GYN ONCOLOGY OFFICE VISIT   Chief complaint: VIN III,    Assessment/Plan:   70 y.o. 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   S/p vulvar and clitoral laser on 11/03/14. Recurrence on right labia majora. S/p wide local excision on right vulva on 03/08/15. Multifocal genital dysplasia Vulva colposcopy consistent with this multifocal acetowhite changes in the peritoneum. A biopsy was collected We will contact her with results this is likely recurrence of VIN will treat with CO2 laser if this is confirmed   Hypertension Follow-up with PCP  HPI:  Regina Ortiz is a 69 y.o. year old who underwent a CO2 laser of the clitoral hood and left labia minora on 11/03/14 for VIN 3 without complications.  Her postoperative course was uncomplicated.  She has multifocal lower genital tract dysplasia, both VIN and VAIN. History is notable for hysterectomy for carcinoma in situ in 1997. She subsequently underwent upper vaginal colpectomies in 1983 and 2000 for vaginal dysplasia. In June 2011, a vulva biopsy demonstrated VIN III. In August 2011, she underwent wide local excision of a right-sided inferior labia majora lesion and CO2 ablation of the right labia minora. She denies any vulvar pruritus or discharge.   Pap 2010, 2011, 2012 2013 wnl. Patient seen 01/2012. Bx c/w VIN III. Patient given trial of Aldara. She stated that it has been difficult to apply Aldara to the external genitalia at the site of the lesions and requested CO2 laser treatment. She underwent Co2 laser ablation of the external genitalia on 06/22/2012.    Patient underwent excision of an external and intern al hemorrhoid with AIN 04/2013.  Biopsy of left labial lesion on 10/12/14 revealed VINIII.  CO2 laser of clitoral hood and left labia minora (anterior) on 11/03/14.  She was started on humira in the spring, 2016 for her RA and this is also significantly helping her colitis symptoms.  She developed  recurrence of VIN3 on right vulva in July 2016, and underwent wide local excision of right vulva on July 25th, 2016. Pathology showed VIN3 involving the 6 o'clock margin.  Pap 01/2016 wnl  Interval Hx: reports vulvovaginal candidiasis for the last week. Has applied vaginal Monistat to the external genitalia with a small amount of relief. Reports significant discomfort with sitting and with the pressure of underwear over the external genitalia  Social history: Husband diagnosed with rectal cancer and recent psychiatric episode that required hospitalization. He is previously a trucker was spent most of his time away from the household. There is a significant amount of stress beats we in Mr. and Mrs. Hylton as a result of spending a lot of time together and the significant comorbid illness.  Review of systems: Constitutional:  She has no weight gain or weight loss. She has no fever or chills. Denies headache Eyes: No blurred vision Ears, Nose, Mouth, Throat: No dizziness, headaches or changes in hearing. No mouth sores. Cardiovascular: No chest pain, palpitations or edema. Denies chest pain Respiratory:  No shortness of breath, wheezing or cough Gastrointestinal: She has normal bowel movements without diarrhea or constipation.  Genitourinary:  She denies pelvic pain. She has no hematuria, dysuria, or incontinence. She has no irregular vaginal bleeding or vaginal discharge Musculoskeletal: Denies muscle weakness or joint pains.  Skin:  She has no skin changes, rashes or itching Neurological:  Denies dizziness or headaches. No neuropathy, no numbness or tingling. Psychiatric:  She denies depression or anxiety. Hematologic/Lymphatic:   No easy bruising or bleeding  Physical Exam: Blood pressure (!) 179/75, pulse 82, temperature 98.4 F (36.9 C), temperature source Oral, resp. rate 18, weight 104 lb (47.2 kg), SpO2 100 %. General: Well dressed, well nourished in no apparent distress.    Genitourinary: Candidiasis of the external genitalia entirely encompassing the labia majora and extending on to the inner thigh.  Vagina - slightly erythematous, no discharge, atrophic. Acetic is acid applied to the external genitalia and the posterior fourchette notable for multifocal areas of acetowhite changes noted on colposcopic examination.  VULVAR BIOPSY  Procedure explained to patient.  The perineal area was prepped with betadine.  The area was infiltrated with lidocaine.  A biopsy was taken from the left aspect of the perineum*.  There was minimal bleeding that was  controlled with silver nitrate and pressure for 2 minutes.  . Patient tolerated the procedure well.   Extremities: No cyanosis, clubbing or edema.  No calf tenderness or erythema. No palpable cords. Psychiatric: Mood and affect are appropriate. Musculoskeletal: No pain, normal strength and range of motion.

## 2016-05-29 NOTE — Op Note (Signed)
Preoperative Diagnosis: Vulvar dysplasia VIN III  Postoperative Diagnosis: Vulvar dysplasia VIN III  Procedure(s) Performed:Co2 laser of the vulva.  Extensive 15 minutes  Surgeon: Francetta Found.  Skeet Latch, M.D. PhD  Anesthesia:LMA  Indication for Procedure: Multifocal vulva dysplasia  Operative Findings: dysplastic areas noted immediately beneath the clitoris, the posterior fourchette, the right perianal area and the left labia majora    Procedure:Ova L Langworthy was taken to the OR and timeout performed.  She was then placed under general anesthesia and her feet placed in the dorsal lithotomy position. Acetic acid was applied and the areas of dysplasia identified.   Denishia L Hazard was prepped and draped in the usual sterile fashion.   A second time out was performed. CO2 laser ablation of the affected areas was applied with continuous pulse and wattage of 8.  Specimens: None  Estimated Blood Loss:<2 mL. Blood Replacement:none  Urine Output: 40 cc  Sponge, lap and needle counts were correct x 3.    Complications: None  The patient had sequential compression devices for VTE prophylaxis          Disposition: PACU - hemodynamically stable.         Condition: Stable

## 2016-05-30 ENCOUNTER — Encounter (HOSPITAL_BASED_OUTPATIENT_CLINIC_OR_DEPARTMENT_OTHER): Payer: Self-pay | Admitting: Gynecologic Oncology

## 2016-06-02 ENCOUNTER — Ambulatory Visit: Payer: PPO

## 2016-06-03 ENCOUNTER — Other Ambulatory Visit: Payer: Self-pay | Admitting: Gynecologic Oncology

## 2016-06-03 DIAGNOSIS — B373 Candidiasis of vulva and vagina: Secondary | ICD-10-CM

## 2016-06-03 DIAGNOSIS — B3731 Acute candidiasis of vulva and vagina: Secondary | ICD-10-CM

## 2016-06-03 MED ORDER — FLUCONAZOLE 100 MG PO TABS: 100.0000 mg | ORAL_TABLET | Freq: Once | ORAL | 0 refills | Status: AC

## 2016-06-09 ENCOUNTER — Other Ambulatory Visit: Payer: Self-pay | Admitting: Gynecologic Oncology

## 2016-06-09 DIAGNOSIS — B3731 Acute candidiasis of vulva and vagina: Secondary | ICD-10-CM

## 2016-06-09 DIAGNOSIS — B373 Candidiasis of vulva and vagina: Secondary | ICD-10-CM

## 2016-06-09 MED ORDER — FLUCONAZOLE 100 MG PO TABS: 100.0000 mg | ORAL_TABLET | ORAL | 1 refills | Status: DC

## 2016-06-19 ENCOUNTER — Ambulatory Visit: Payer: PPO | Attending: Gynecologic Oncology | Admitting: Gynecologic Oncology

## 2016-06-19 ENCOUNTER — Encounter: Payer: Self-pay | Admitting: Gynecologic Oncology

## 2016-06-19 VITALS — BP 156/70 | HR 96 | Temp 98.4°F | Resp 17 | Wt 101.9 lb

## 2016-06-19 DIAGNOSIS — B373 Candidiasis of vulva and vagina: Secondary | ICD-10-CM | POA: Insufficient documentation

## 2016-06-19 DIAGNOSIS — D071 Carcinoma in situ of vulva: Secondary | ICD-10-CM | POA: Insufficient documentation

## 2016-06-19 NOTE — Patient Instructions (Signed)
Follow up in January 2018 as discussed with Dr Janie Morning. Call with any changes , questions or concerns.  Thank you

## 2016-06-19 NOTE — Progress Notes (Signed)
GYN ONCOLOGY OFFICE VISIT   Chief complaint: VIN III,    Assessment/Plan:   70 y.o. 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   S/p vulvar and clitoral laser on 11/03/14. Recurrence on right labia majora. S/p wide local excision on right vulva on 03/08/15. VIN3 dx 05/01/2016 treated with CO2 laser 05/29/2016 Apply hurricane gel to tender vulvar areas prn F/U 09/2016  Vaginal candidiasis No discharge appreciated today Rec monistat cream prn symptoms   HPI:  Regina Ortiz is a 70 y.o.  who underwent a CO2 laser of the clitoral hood and left labia minora on 11/03/14 for VIN 3 without complications.  Her postoperative course was uncomplicated.  She has multifocal lower genital tract dysplasia, both VIN and VAIN. History is notable for hysterectomy for carcinoma in situ in 1997. She subsequently underwent upper vaginal colpectomies in 1983 and 2000 for vaginal dysplasia. In June 2011, a vulva biopsy demonstrated VIN III. In August 2011, she underwent wide local excision of a right-sided inferior labia majora lesion and CO2 ablation of the right labia minora. She denies any vulvar pruritus or discharge.   Pap 2010, 2011, 2012 2013 wnl. Patient seen 01/2012. Bx c/w VIN III. Patient given trial of Aldara. She stated that it has been difficult to apply Aldara to the external genitalia at the site of the lesions and requested CO2 laser treatment. She underwent Co2 laser ablation of the external genitalia on 06/22/2012.  Patient underwent excision of an external and intern al hemorrhoid with AIN 04/2013.  Biopsy of left labial lesion on 10/12/14 revealed VINIII.  CO2 laser of clitoral hood and left labia minora (anterior) on 11/03/14.  She was started on humira in the spring, 2016 for her RA and this is also significantly helping her colitis symptoms.  She developed recurrence of VIN3 on right vulva in July 2016, and underwent wide local excision of right vulva on July  25th, 2016. Pathology showed VIN3 involving the 6 o'clock margin.  Pap 01/2016 wnl  Interval Hx:  VIN 3 confirmed 05/01/2016.  Co2 laser of the external genitalia 05/29/2016.  C/O tenderness at laser sites.  Past Surgical History:  Procedure Laterality Date  . APPENDECTOMY  1992  . CARDIOVASCULAR STRESS TEST  06-23-2012  DR Jenkins Rouge   NORMAL LEXISCAN MYOVIEW/ EF 83%/  NO ISCHEMIA  . CO2 LASER APPLICATION  123XX123   Procedure: CO2 LASER APPLICATION;  Surgeon: Janie Morning, MD PHD;  Location: [facility];  Service: Gynecology;  Laterality: N/A;  Laser Vaporization  . CO2 LASER APPLICATION N/A [id number]   Procedure: CO2 LASER APPLICATION;  Surgeon: [name], MD;  Location: [facility];  Service: Gynecology;  Laterality: N/A;  . CO2 LASER APPLICATION N/A [id number]   Procedure: CO2 LASER APPLICATION of the vulva;  Surgeon: Janie Morning, MD;  Location: [facility];  Service: Gynecology;  Laterality: N/A;  . EXCISION VULVAR LESIONS  07/2012  . HEMORRHOID SURGERY N/A 04/15/2013   Procedure: Excision of external and internal  hemorrhoid with AIN, Exam under anesthesia;  Surgeon: Odis Hollingshead, MD;  Location: [facility];  Service: General;  Laterality: N/A;  . REPAIR FISTULA IN ANO/  I & D PERIRECTAL ABSCESS  10-26-2002  DR Johnathan Hausen  . TRANSTHORACIC ECHOCARDIOGRAM  06-23-2012   NORMAL LVSF/ EF 55-60%  . VAGINAL HYSTERECTOMY  1997   partial  . VULVAR LESION REMOVAL N/A 11/03/2014   Procedure: CO2 LASER OF VULVAR ;  Surgeon: Terrence Dupont  Denman George, MD;  Location: [facility];  Service: Gynecology;  Laterality: N/A;  . VULVECTOMY N/A 03/26/2015   Procedure: WIDE LOCA  EXCISION OF VULVAR;  Surgeon: [name], MD;  Location: [place];  Service: Gynecology;  Laterality: N/A;  . Wide Local Excision of labia majora  04-04-2010   Right-sided lesion, CO2 ablation of right labia minora    Social history: Husband diagnosed with  rectal cancer and recent psychiatric episode that required hospitalization. He is previously a trucker was spent most of his time away from the household. There is a significant amount of stress beats we in Mr. and Mrs. Caserta as a result of spending a lot of time together and the significant comorbid illness.  Review of systems: Constitutional:  She has no weight gain or weight loss. She has no fever or chills. Denies headache Gastrointestinal: She has normal bowel movements without diarrhea or constipation.  Genitourinary:  . Reports recurrent yeast infections and exquisite tenderness at laser sites.     Physical Exam: Blood pressure (!) 156/70, pulse 96, temperature 98.4 F (36.9 C), temperature source Oral, resp. rate 17, weight 101 lb 14.4 oz (46.2 kg), SpO2 100 %. General: Well dressed, well nourished in no apparent distress.   Genitourinary:sites of Co2 laser in the posterior fourchette and left intra labial folds with redness c/w  Recent laser.  Tender to touch, no lymphangitic streaking.  Hurricane gel applied with relief of discomfort. Vagina atrophic, no discharge.

## 2016-06-30 ENCOUNTER — Ambulatory Visit
Admission: RE | Admit: 2016-06-30 | Discharge: 2016-06-30 | Disposition: A | Discharge status: Home / Self Care | Payer: PPO | Source: Ambulatory Visit | Attending: Family Medicine | Admitting: Family Medicine

## 2016-06-30 DIAGNOSIS — Z1231 Encounter for screening mammogram for malignant neoplasm of breast: Secondary | ICD-10-CM

## 2016-08-22 DIAGNOSIS — B029 Zoster without complications: Secondary | ICD-10-CM | POA: Diagnosis not present

## 2016-08-22 DIAGNOSIS — Z23 Encounter for immunization: Secondary | ICD-10-CM | POA: Diagnosis not present

## 2016-09-08 DIAGNOSIS — L659 Nonscarring hair loss, unspecified: Secondary | ICD-10-CM | POA: Diagnosis not present

## 2016-09-08 DIAGNOSIS — J0100 Acute maxillary sinusitis, unspecified: Secondary | ICD-10-CM | POA: Diagnosis not present

## 2016-09-25 ENCOUNTER — Encounter: Payer: Self-pay | Admitting: *Deleted

## 2016-09-25 DIAGNOSIS — L650 Telogen effluvium: Secondary | ICD-10-CM | POA: Diagnosis not present

## 2016-09-29 ENCOUNTER — Ambulatory Visit: Payer: PPO | Admitting: Gynecologic Oncology

## 2016-09-30 ENCOUNTER — Ambulatory Visit (INDEPENDENT_AMBULATORY_CARE_PROVIDER_SITE_OTHER): Payer: PPO | Admitting: Cardiology

## 2016-09-30 ENCOUNTER — Encounter (INDEPENDENT_AMBULATORY_CARE_PROVIDER_SITE_OTHER): Payer: Self-pay

## 2016-09-30 ENCOUNTER — Encounter: Payer: Self-pay | Admitting: Cardiology

## 2016-09-30 VITALS — BP 154/82 | HR 76 | Ht 64.0 in | Wt 105.8 lb

## 2016-09-30 DIAGNOSIS — Z720 Tobacco use: Secondary | ICD-10-CM

## 2016-09-30 DIAGNOSIS — I447 Left bundle-branch block, unspecified: Secondary | ICD-10-CM

## 2016-09-30 DIAGNOSIS — I6529 Occlusion and stenosis of unspecified carotid artery: Secondary | ICD-10-CM | POA: Diagnosis not present

## 2016-09-30 DIAGNOSIS — I10 Essential (primary) hypertension: Secondary | ICD-10-CM | POA: Diagnosis not present

## 2016-09-30 DIAGNOSIS — Z79899 Other long term (current) drug therapy: Secondary | ICD-10-CM | POA: Diagnosis not present

## 2016-09-30 DIAGNOSIS — E781 Pure hyperglyceridemia: Secondary | ICD-10-CM | POA: Diagnosis not present

## 2016-09-30 DIAGNOSIS — R0989 Other specified symptoms and signs involving the circulatory and respiratory systems: Secondary | ICD-10-CM

## 2016-09-30 MED ORDER — ATORVASTATIN CALCIUM 40 MG PO TABS: 40.0000 mg | ORAL_TABLET | Freq: Every day | ORAL | 3 refills | Status: DC

## 2016-09-30 NOTE — Progress Notes (Signed)
Cardiology Office Note    Date:  09/30/2016   ID:  Regina Ortiz, Regina Ortiz 08/01/1946, MRN [id number]  PCP:  Wynelle Fanny  Cardiologist:   Candee Furbish, MD     History of Present Illness:  Regina Ortiz is a 71 y.o. female with left bundle branch block here for follow-up. New  Previously had a low risk nuclear stress test and echocardiogram which was normal. Previously hyponatremia with over drinking of water.  She's gone through a rough time recently with her husband who had a major depressive disorder, breakdown. She was in and out of [employer], [employer]. Struggling this.  Continues to smoke. No significant chest pain. Rare palpitations. Mild ankle edema noted. No strokelike symptoms.  She notes bruising that has been fairly easy. Aspirin 81. We have stopped. She takes Humira for rheumatoid arthritis and is under good control.  Had shingles and the flu.    Past Medical History:  Diagnosis Date  . AIN grade I   . Anxiety   . Bruises easily   . Chronic diarrhea   . Chronic hyponatremia   . Colitis   . COPD (chronic obstructive pulmonary disease) ([place])   . Depression   . GERD (gastroesophageal reflux disease)   . History of chronic bronchitis   . History of vulvar dysplasia    serveral recurrency's   . Hypertension   . LBBB (left bundle branch block) W/ PROLONGED PR   CARDIOLOGSIT-  DR Johnsie Cancel-   LOV IN EPIC  . Macrocytosis   . Nocturia   . Osteoporosis   . Seronegative rheumatoid arthritis ([place])   . Short of breath on exertion   . Smokers' cough ([place])   . Venous insufficiency of leg    , Edema  . VIN III (vulvar intraepithelial neoplasia III)    RECURRENT -    Past Surgical History:  Procedure Laterality Date  . APPENDECTOMY  1992  . CARDIOVASCULAR STRESS TEST  06-23-2012  DR Jenkins Rouge   NORMAL LEXISCAN MYOVIEW/ EF 83%/  NO ISCHEMIA  . CO2 LASER APPLICATION  123XX123   Procedure: CO2 LASER APPLICATION;  Surgeon: Janie Morning, MD PHD;   Location: [facility];  Service: Gynecology;  Laterality: N/A;  Laser Vaporization  . CO2 LASER APPLICATION N/A [id number]   Procedure: CO2 LASER APPLICATION;  Surgeon: [name], MD;  Location[facility];  Service: Gynecology;  Laterality: N/A;  . CO2 LASER APPLICATION N/A 99991111   Procedure: CO2 LASER APPLICATION of the vulva;  Surgeon: Janie Morning, MD;  Location: [facility];  Service: Gynecology;  Laterality: N/A;  . EXCISION VULVAR LESIONS  07/2012  . HEMORRHOID SURGERY N/A 04/15/2013   Procedure: Excision of external and internal  hemorrhoid with AIN, Exam under anesthesia;  Surgeon: Odis Hollingshead, MD;  [facility];  Service: General;  Laterality: N/A;  . REPAIR FISTULA IN ANO/  I & D PERIRECTAL ABSCESS  10-26-2002  DR Johnathan Hausen  . TRANSTHORACIC ECHOCARDIOGRAM  06-23-2012   NORMAL LVSF/ EF 55-60%  . VAGINAL HYSTERECTOMY  1997   partial  . VULVAR LESION REMOVAL N/A 11/03/2014   Procedure: CO2 LASER OF VULVAR ;  Surgeon: [name], MD;  Location: [facility];  Service: Gynecology;  Laterality: N/A;  . VULVECTOMY N/A 03/26/2015   Procedure: WIDE LOCA  EXCISION OF VULVAR;  Surgeon: [name], MD;  Location: [place];  Service: Gynecology;  Laterality: N/A;  . Wide  Local Excision of labia majora  04-04-2010   Right-sided lesion, CO2 ablation of right labia minora    Outpatient Medications Prior to Visit  Medication Sig Dispense Refill  . Adalimumab (HUMIRA PEN [place]) Inject into the skin as directed. Per pt, she does injection every week on Wednesday - unsure of dose    . b complex vitamins capsule Take 1 capsule by mouth daily.    Marland Kitchen buPROPion (WELLBUTRIN SR) 150 MG 12 hr tablet Take 150 mg by mouth every morning.     . busPIRone (BUSPAR) 30 MG tablet Take 30 mg by mouth 2 (two) times daily.     . Calcium Carbonate-Vitamin D (CALTRATE 600+D PO) Take 1 tablet by mouth every morning.     .  Cholecalciferol (VITAMIN D3) 1000 UNITS CAPS Take 1,000 Units by mouth every morning.     . cycloSPORINE (RESTASIS) 0.05 % ophthalmic emulsion Place 1 drop into both eyes 2 (two) times daily.    Marland Kitchen diltiazem (TIAZAC) 360 MG 24 hr capsule Take 360 mg by mouth every morning.    . fluconazole (DIFLUCAN) 100 MG tablet Take 1 tablet (100 mg total) by mouth as directed. 1 tablet 1  . ibuprofen (ADVIL,MOTRIN) 600 MG tablet Take 600 mg by mouth every 6 (six) hours as needed for fever, headache, mild pain, moderate pain or cramping.    . lactase (LACTAID) 3000 UNITS tablet Take 1 tablet by mouth as directed.     Marland Kitchen Lysine 500 MG TABS Take 500 mg by mouth 2 (two) times daily.     Marland Kitchen omeprazole (PRILOSEC) 20 MG capsule Take 20 mg by mouth every morning.     . Potassium (POTASSIMIN PO) Take 1 tablet by mouth every evening.     . predniSONE (DELTASONE) 5 MG tablet Take 5 mg by mouth daily with breakfast. --  LONG-TERM USE FOR RA    . PRENATAL VITAMINS PO Take 1 tablet by mouth every morning.     . Probiotic Product (ALIGN) 4 MG CAPS Take 1 tablet by mouth daily.    . traZODone (DESYREL) 50 MG tablet Take 50 mg by mouth at bedtime.     . valsartan (DIOVAN) 320 MG tablet Take 320 mg by mouth every evening.      No facility-administered medications prior to visit.      Allergies:   Latex; Doxycycline; Lactose intolerance (gi); Lialda [mesalamine]; Prinivil [lisinopril]; Triamcinolone acetonide; Amlodipine; and Lotensin [benazepril hcl]   Social History   Social History  . Marital status: Married    Spouse name: N/A  . Number of children: N/A  . Years of education: N/A   Social History Main Topics  . Smoking status: Current Every Day Smoker    Packs/day: 0.25    Years: 50.00    Types: Cigarettes  . Smokeless tobacco: Never Used  . Alcohol use 1.2 oz/week    2 Cans of beer per week  . Drug use: No  . Sexual activity: Yes   Other Topics Concern  . None   Social History Narrative  . None      Family History:  The patient's family history includes Cancer in her son; Diabetes in her mother; Hypertension in her father; Lymphoma in her son.   ROS:   Please see the history of present illness.    ROS  No syncope, no bleeding, no orthopnea, no PND. All other systems reviewed and are negative.   PHYSICAL EXAM:   VS:  BP (!) 154/82  Pulse 76   Ht 5\' 4"  (1.626 m)   Wt 105 lb 12.8 oz (48 kg)   LMP  (LMP Unknown)   BMI 18.16 kg/m    GEN: Well nourished, well developed, in no acute distress  HEENT: normal  Neck: no JVD, + bilateral carotid bruits, or masses Cardiac: RRR; no murmurs, rubs, or gallops,no edema  Respiratory:  clear to auscultation bilaterally, normal work of breathing GI: soft, nontender, nondistended, + BS MS: no deformity or atrophy rheumatic changes knuckles in hand Skin: warm and dry, no rash Neuro:  Alert and Oriented x 3, Strength and sensation are intact Psych: euthymic mood, full affect  Wt Readings from Last 3 Encounters:  09/30/16 105 lb 12.8 oz (48 kg)  06/19/16 101 lb 14.4 oz (46.2 kg)  05/29/16 102 lb (46.3 kg)      Studies/Labs Reviewed:   EKG:  EKG is ordered today.  The ekg ordered today demonstrates 09/30/16-sinus rhythm 76 left bundle branch block personally viewed-prior 09/19/15-sinus rhythm, 86, left bundle branch block  Recent Labs: 05/29/2016: Hemoglobin 11.6; Potassium 4.4; Sodium 131   Lipid Panel    Component Value Date/Time   CHOL 142 06/23/2012 0500   TRIG 42 06/23/2012 0500   HDL 82 06/23/2012 0500   CHOLHDL 1.7 06/23/2012 0500   VLDL 8 06/23/2012 0500   LDLCALC 52 06/23/2012 0500    Additional studies/ records that were reviewed today include:   Echo: 06/23/12: - Left ventricle: The cavity size was normal. Systolic function was normal. The estimated ejection fraction was in the range of 55% to 60%. Wall motion was normal; there were no regional wall motion abnormalities. - Atrial septum: No defect or patent  foramen ovale was identified.  NUC stress 06/23/12: -Normal Lexiscan myovue EF 83%   ASSESSMENT:    1. Essential hypertension   2. Stenosis of carotid artery, unspecified laterality   3. Encounter for long-term current use of medication   4. Pure hyperglyceridemia   5. Bilateral carotid bruits   6. Tobacco use   7. LBBB (left bundle branch block)      PLAN:  In order of problems listed above:  1. Carotid artery diesease - moderate, repeating.Starting statin therapy for plaque stabilization. Atorvastatin 40 mg once a day. We will check ALT and lipid profile in 2 months. Instructed [place] to let us know if she is having any medication side effects. 2. Tobacco use - Continue to advocate smoking cessation 3. Left bundle branch block is chronic. No high-risk symptoms such as syncope. No chest pain. 4. Recent influenza-still has fatigue.    Medication Adjustments/Labs and Tests Ordered: Current medicines are reviewed at length with the patient today.  Concerns regarding medicines are outlined above.  Medication changes, Labs and Tests ordered today are listed in the Patient Instructions below. Patient Instructions  Medication Instructions:  Please start Atorvastatin 40 mg a day. Continue all other medications as listed.  Labwork: Please return in 2 months for fasting lab work (Lipid and ALT)  Follow-Up: Follow up in 1 year with Dr. Marlou Porch.  You will receive a letter in the mail 2 months before you are due.  Please call us when you receive this letter to schedule your follow up appointment.  If you need a refill on your cardiac medications before your next appointment, please call your pharmacy.  Thank you for choosing [facility]!!          Signed, Candee Furbish, MD  09/30/2016 1:51 PM  [facility] [address], [place], [place]  [address] Phone: [phone]; Fax: [phone]

## 2016-09-30 NOTE — Patient Instructions (Signed)
Medication Instructions:  Please start Atorvastatin 40 mg a day. Continue all other medications as listed.  Labwork: Please return in 2 months for fasting lab work (Lipid and ALT)  Follow-Up: Follow up in 1 year with Dr. Marlou Porch.  You will receive a letter in the mail 2 months before you are due.  Please call us when you receive this letter to schedule your follow up appointment.  If you need a refill on your cardiac medications before your next appointment, please call your pharmacy.  Thank you for choosing [facility]!!

## 2016-10-13 DIAGNOSIS — L309 Dermatitis, unspecified: Secondary | ICD-10-CM | POA: Diagnosis not present

## 2016-10-21 DIAGNOSIS — H16223 Keratoconjunctivitis sicca, not specified as Sjogren's, bilateral: Secondary | ICD-10-CM | POA: Diagnosis not present

## 2016-10-21 DIAGNOSIS — H25043 Posterior subcapsular polar age-related cataract, bilateral: Secondary | ICD-10-CM | POA: Diagnosis not present

## 2016-10-21 DIAGNOSIS — H25013 Cortical age-related cataract, bilateral: Secondary | ICD-10-CM | POA: Diagnosis not present

## 2016-10-21 DIAGNOSIS — H2511 Age-related nuclear cataract, right eye: Secondary | ICD-10-CM | POA: Diagnosis not present

## 2016-11-17 NOTE — Progress Notes (Signed)
GYN ONCOLOGY OFFICE VISIT   Chief complaint: VIN III,    Assessment/Plan:   71 y.o. 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   S/p vulvar and clitoral laser on 11/03/14. Recurrence on right labia majora. S/p wide local excision on right vulva on 03/08/15. VIN3 dx 05/01/2016 treated with CO2 laser 05/29/2016 Evaluation of the vulva with acetic acide without areas of acetowhite changes. Follow-up in 1 year.   HPI:  Regina Ortiz is a 71 y.o.  who underwent a CO2 laser of the clitoral hood and left labia minora on 11/03/14 for VIN 3 without complications.  Her postoperative course was uncomplicated.  She has multifocal lower genital tract dysplasia, both VIN and VAIN. History is notable for hysterectomy for carcinoma in situ in 1997. She subsequently underwent upper vaginal colpectomies in 1983 and 2000 for vaginal dysplasia. In June 2011, a vulva biopsy demonstrated VIN III. In August 2011, she underwent wide local excision of a right-sided inferior labia majora lesion and CO2 ablation of the right labia minora. She denies any vulvar pruritus or discharge.   Pap 2010, 2011, 2012 2013 wnl. Patient seen 01/2012. Bx c/w VIN III. Patient given trial of Aldara. She stated that it has been difficult to apply Aldara to the external genitalia at the site of the lesions and requested CO2 laser treatment. She underwent Co2 laser ablation of the external genitalia on 06/22/2012.  Patient underwent excision of an external and intern al hemorrhoid with AIN 04/2013.  Biopsy of left labial lesion on 10/12/14 revealed VINIII.  CO2 laser of clitoral hood and left labia minora (anterior) on 11/03/14.  She was started on humira in the spring, 2016 for her RA and this is also significantly helping her colitis symptoms.  She developed recurrence of VIN3 on right vulva in July 2016, and underwent wide local excision of right vulva on July 25th, 2016. Pathology showed VIN3 involving  the 6 o'clock margin.  Pap 01/2016 wnl  VIN 3 confirmed 05/01/2016.  Co2 laser of the external genitalia 05/29/2016.   Denies pruritis, masses  or bleeding.    Interval Hx:     Past Surgical History:  Procedure Laterality Date  . APPENDECTOMY  1992  . CARDIOVASCULAR STRESS TEST  06-23-2012  DR Jenkins Rouge   NORMAL LEXISCAN MYOVIEW/ EF 83%/  NO ISCHEMIA  . CO2 LASER APPLICATION  78/67/6720   Procedure: CO2 LASER APPLICATION;  Surgeon: Janie Morning, MD PHD;  Location: [facility];  Service: Gynecology;  Laterality: N/A;  Laser Vaporization  . CO2 LASER APPLICATION N/A 05/06/7095   Procedure: CO2 LASER APPLICATION;  Surgeon: [name], MD;  Location: [facility];  Service: Gynecology;  Laterality: N/A;  . CO2 LASER APPLICATION N/A 2/83/6629   Procedure: CO2 LASER APPLICATION of the vulva;  Surgeon: Janie Morning, MD;  Location: [facility];  Service: Gynecology;  Laterality: N/A;  . EXCISION VULVAR LESIONS  07/2012  . HEMORRHOID SURGERY N/A 04/15/2013   Procedure: Excision of external and internal  hemorrhoid with AIN, Exam under anesthesia;  Surgeon: Odis Hollingshead, MD;  [facility];  Service: General;  Laterality: N/A;  . REPAIR FISTULA IN ANO/  I & D PERIRECTAL ABSCESS  10-26-2002  DR Johnathan Hausen  . TRANSTHORACIC ECHOCARDIOGRAM  06-23-2012   NORMAL LVSF/ EF 55-60%  . VAGINAL HYSTERECTOMY  1997   partial  . VULVAR LESION REMOVAL N/A 11/03/2014   Procedure: CO2 LASER OF VULVAR ;  Surgeon: [name], MD;  Location: [facility];  Service: Gynecology;  Laterality: N/A;  . VULVECTOMY N/A 03/26/2015   Procedure: WIDE LOCA  EXCISION OF VULVAR;  Surgeon: [name], MD;  Location: [place];  Service: Gynecology;  Laterality: N/A;  . Wide Local Excision of labia majora  04-04-2010   Right-sided lesion, CO2 ablation of right labia minora    Social history: Husband diagnosed with rectal cancer and recent  psychiatric episode that required hospitalization. He is previously a trucker was spent most of his time away from the household. There is a significant amount of stress between  Mr. and Mrs. Ponds as a result of spending a lot of time together and the significant comorbid illness.  Review of systems: Constitutional:  She has no weight gain or weight loss. She has no fever or chills. Denies headache Gastrointestinal: She has normal bowel movements without diarrhea or constipation.  Genitourinary:  .No pruritis or bleeding.  Physical Exam: Blood pressure (!) 179/81, pulse 94, temperature 98.6 F (37 C), temperature source Oral, resp. rate 20, weight 109 lb (49.4 kg). General: Well dressed, well nourished in no apparent distress.   Genitourinary:Acetic acid applied and no acetowhite changes noted in the area of the external genitalia.

## 2016-11-18 ENCOUNTER — Ambulatory Visit: Payer: PPO | Attending: Gynecologic Oncology | Admitting: Gynecologic Oncology

## 2016-11-18 ENCOUNTER — Encounter: Payer: Self-pay | Admitting: Gynecologic Oncology

## 2016-11-18 VITALS — BP 179/81 | HR 94 | Temp 98.6°F | Resp 20 | Wt 109.0 lb

## 2016-11-18 DIAGNOSIS — D071 Carcinoma in situ of vulva: Secondary | ICD-10-CM | POA: Diagnosis not present

## 2016-11-18 NOTE — Patient Instructions (Signed)
Dr Skeet Latch wants to see you in a year.  Call the office at [phone] in January of 2019 to make appointment for March of 2019.

## 2016-11-25 ENCOUNTER — Other Ambulatory Visit: Payer: PPO

## 2016-11-25 DIAGNOSIS — E781 Pure hyperglyceridemia: Secondary | ICD-10-CM

## 2016-11-25 DIAGNOSIS — Z79899 Other long term (current) drug therapy: Secondary | ICD-10-CM | POA: Diagnosis not present

## 2016-11-25 LAB — LIPID PANEL
CHOL/HDL RATIO: 1.4 ratio (ref 0.0–4.4)
Cholesterol, Total: 150 mg/dL (ref 100–199)
HDL: 105 mg/dL (ref >39)
LDL CALC: 40 mg/dL (ref 0–99)
TRIGLYCERIDES: 24 mg/dL (ref 0–149)
VLDL Cholesterol Cal: 5 mg/dL (ref 5–40)

## 2016-11-25 LAB — ALT: ALT: 28 IU/L (ref 0–32)

## 2016-11-26 ENCOUNTER — Other Ambulatory Visit: Payer: PPO

## 2016-11-26 DIAGNOSIS — Z79899 Other long term (current) drug therapy: Secondary | ICD-10-CM | POA: Diagnosis not present

## 2016-11-26 DIAGNOSIS — M2550 Pain in unspecified joint: Secondary | ICD-10-CM | POA: Diagnosis not present

## 2016-11-26 DIAGNOSIS — Z681 Body mass index (BMI) 19 or less, adult: Secondary | ICD-10-CM | POA: Diagnosis not present

## 2016-11-26 DIAGNOSIS — M0609 Rheumatoid arthritis without rheumatoid factor, multiple sites: Secondary | ICD-10-CM | POA: Diagnosis not present

## 2016-11-26 DIAGNOSIS — K5289 Other specified noninfective gastroenteritis and colitis: Secondary | ICD-10-CM | POA: Diagnosis not present

## 2016-12-22 DIAGNOSIS — L853 Xerosis cutis: Secondary | ICD-10-CM | POA: Diagnosis not present

## 2016-12-22 DIAGNOSIS — L650 Telogen effluvium: Secondary | ICD-10-CM | POA: Diagnosis not present

## 2017-02-10 DIAGNOSIS — F331 Major depressive disorder, recurrent, moderate: Secondary | ICD-10-CM | POA: Diagnosis not present

## 2017-02-10 DIAGNOSIS — S39012A Strain of muscle, fascia and tendon of lower back, initial encounter: Secondary | ICD-10-CM | POA: Diagnosis not present

## 2017-02-23 DIAGNOSIS — K5289 Other specified noninfective gastroenteritis and colitis: Secondary | ICD-10-CM | POA: Diagnosis not present

## 2017-02-23 DIAGNOSIS — R197 Diarrhea, unspecified: Secondary | ICD-10-CM | POA: Diagnosis not present

## 2017-02-23 DIAGNOSIS — F1010 Alcohol abuse, uncomplicated: Secondary | ICD-10-CM | POA: Diagnosis not present

## 2017-02-23 DIAGNOSIS — M069 Rheumatoid arthritis, unspecified: Secondary | ICD-10-CM | POA: Diagnosis not present

## 2017-02-27 DIAGNOSIS — R197 Diarrhea, unspecified: Secondary | ICD-10-CM | POA: Diagnosis not present

## 2017-03-09 DIAGNOSIS — F411 Generalized anxiety disorder: Secondary | ICD-10-CM | POA: Diagnosis not present

## 2017-03-16 DIAGNOSIS — M069 Rheumatoid arthritis, unspecified: Secondary | ICD-10-CM | POA: Diagnosis not present

## 2017-03-16 DIAGNOSIS — K219 Gastro-esophageal reflux disease without esophagitis: Secondary | ICD-10-CM | POA: Diagnosis not present

## 2017-03-16 DIAGNOSIS — F1010 Alcohol abuse, uncomplicated: Secondary | ICD-10-CM | POA: Diagnosis not present

## 2017-03-16 DIAGNOSIS — K52831 Collagenous colitis: Secondary | ICD-10-CM | POA: Diagnosis not present

## 2017-03-16 DIAGNOSIS — A0472 Enterocolitis due to Clostridium difficile, not specified as recurrent: Secondary | ICD-10-CM | POA: Diagnosis not present

## 2017-03-16 DIAGNOSIS — R197 Diarrhea, unspecified: Secondary | ICD-10-CM | POA: Diagnosis not present

## 2017-03-20 DIAGNOSIS — L710 Perioral dermatitis: Secondary | ICD-10-CM | POA: Diagnosis not present

## 2017-03-26 DIAGNOSIS — L218 Other seborrheic dermatitis: Secondary | ICD-10-CM | POA: Diagnosis not present

## 2017-05-20 DIAGNOSIS — K5289 Other specified noninfective gastroenteritis and colitis: Secondary | ICD-10-CM | POA: Diagnosis not present

## 2017-05-20 DIAGNOSIS — R197 Diarrhea, unspecified: Secondary | ICD-10-CM | POA: Diagnosis not present

## 2017-05-20 DIAGNOSIS — A0472 Enterocolitis due to Clostridium difficile, not specified as recurrent: Secondary | ICD-10-CM | POA: Diagnosis not present

## 2017-06-01 DIAGNOSIS — M0609 Rheumatoid arthritis without rheumatoid factor, multiple sites: Secondary | ICD-10-CM | POA: Diagnosis not present

## 2017-06-01 DIAGNOSIS — Z79899 Other long term (current) drug therapy: Secondary | ICD-10-CM | POA: Diagnosis not present

## 2017-06-01 DIAGNOSIS — Z681 Body mass index (BMI) 19 or less, adult: Secondary | ICD-10-CM | POA: Diagnosis not present

## 2017-06-01 DIAGNOSIS — K5289 Other specified noninfective gastroenteritis and colitis: Secondary | ICD-10-CM | POA: Diagnosis not present

## 2017-06-01 DIAGNOSIS — M2550 Pain in unspecified joint: Secondary | ICD-10-CM | POA: Diagnosis not present

## 2017-06-22 ENCOUNTER — Other Ambulatory Visit: Payer: Self-pay | Admitting: Family Medicine

## 2017-06-22 DIAGNOSIS — Z1231 Encounter for screening mammogram for malignant neoplasm of breast: Secondary | ICD-10-CM

## 2017-07-09 ENCOUNTER — Ambulatory Visit
Admission: RE | Admit: 2017-07-09 | Discharge: 2017-07-09 | Disposition: A | Discharge status: Home / Self Care | Payer: PPO | Source: Ambulatory Visit | Attending: Family Medicine | Admitting: Family Medicine

## 2017-07-09 DIAGNOSIS — Z1231 Encounter for screening mammogram for malignant neoplasm of breast: Secondary | ICD-10-CM

## 2017-07-13 DIAGNOSIS — R197 Diarrhea, unspecified: Secondary | ICD-10-CM | POA: Diagnosis not present

## 2017-07-21 DIAGNOSIS — K219 Gastro-esophageal reflux disease without esophagitis: Secondary | ICD-10-CM | POA: Diagnosis not present

## 2017-07-21 DIAGNOSIS — K5289 Other specified noninfective gastroenteritis and colitis: Secondary | ICD-10-CM | POA: Diagnosis not present

## 2017-07-21 DIAGNOSIS — A0472 Enterocolitis due to Clostridium difficile, not specified as recurrent: Secondary | ICD-10-CM | POA: Diagnosis not present

## 2017-07-21 DIAGNOSIS — J441 Chronic obstructive pulmonary disease with (acute) exacerbation: Secondary | ICD-10-CM | POA: Diagnosis not present

## 2017-09-17 ENCOUNTER — Other Ambulatory Visit: Payer: Self-pay | Admitting: Cardiology

## 2017-09-18 DIAGNOSIS — E538 Deficiency of other specified B group vitamins: Secondary | ICD-10-CM | POA: Diagnosis not present

## 2017-09-18 DIAGNOSIS — F17200 Nicotine dependence, unspecified, uncomplicated: Secondary | ICD-10-CM | POA: Diagnosis not present

## 2017-09-18 DIAGNOSIS — Z136 Encounter for screening for cardiovascular disorders: Secondary | ICD-10-CM | POA: Diagnosis not present

## 2017-09-18 DIAGNOSIS — J449 Chronic obstructive pulmonary disease, unspecified: Secondary | ICD-10-CM | POA: Diagnosis not present

## 2017-09-18 DIAGNOSIS — Z0000 Encounter for general adult medical examination without abnormal findings: Secondary | ICD-10-CM | POA: Diagnosis not present

## 2017-09-18 DIAGNOSIS — I10 Essential (primary) hypertension: Secondary | ICD-10-CM | POA: Diagnosis not present

## 2017-09-18 DIAGNOSIS — Z13220 Encounter for screening for lipoid disorders: Secondary | ICD-10-CM | POA: Diagnosis not present

## 2017-09-23 ENCOUNTER — Ambulatory Visit (HOSPITAL_COMMUNITY): Admission: EM | Admit: 2017-09-23 | Discharge: 2017-09-23 | Discharge status: Against Medical Advice | Payer: PPO

## 2017-09-23 ENCOUNTER — Inpatient Hospital Stay (HOSPITAL_COMMUNITY)
Admission: EM | Admit: 2017-09-23 | Discharge: 2017-09-27 | DRG: 313 | Disposition: A | Discharge status: Home Health | Payer: PPO | Attending: Internal Medicine | Admitting: Internal Medicine

## 2017-09-23 ENCOUNTER — Emergency Department (HOSPITAL_COMMUNITY): Payer: PPO

## 2017-09-23 ENCOUNTER — Other Ambulatory Visit: Payer: Self-pay

## 2017-09-23 ENCOUNTER — Encounter (HOSPITAL_COMMUNITY): Payer: Self-pay | Admitting: Emergency Medicine

## 2017-09-23 DIAGNOSIS — R0602 Shortness of breath: Secondary | ICD-10-CM | POA: Diagnosis not present

## 2017-09-23 DIAGNOSIS — K219 Gastro-esophageal reflux disease without esophagitis: Secondary | ICD-10-CM | POA: Diagnosis present

## 2017-09-23 DIAGNOSIS — I700 Atherosclerosis of aorta: Secondary | ICD-10-CM | POA: Diagnosis not present

## 2017-09-23 DIAGNOSIS — Z7289 Other problems related to lifestyle: Secondary | ICD-10-CM | POA: Diagnosis not present

## 2017-09-23 DIAGNOSIS — F17210 Nicotine dependence, cigarettes, uncomplicated: Secondary | ICD-10-CM | POA: Diagnosis not present

## 2017-09-23 DIAGNOSIS — Z789 Other specified health status: Secondary | ICD-10-CM | POA: Diagnosis present

## 2017-09-23 DIAGNOSIS — I447 Left bundle-branch block, unspecified: Secondary | ICD-10-CM | POA: Diagnosis not present

## 2017-09-23 DIAGNOSIS — F32A Depression, unspecified: Secondary | ICD-10-CM | POA: Diagnosis present

## 2017-09-23 DIAGNOSIS — F419 Anxiety disorder, unspecified: Secondary | ICD-10-CM | POA: Diagnosis present

## 2017-09-23 DIAGNOSIS — I6521 Occlusion and stenosis of right carotid artery: Secondary | ICD-10-CM | POA: Diagnosis not present

## 2017-09-23 DIAGNOSIS — M069 Rheumatoid arthritis, unspecified: Secondary | ICD-10-CM | POA: Diagnosis present

## 2017-09-23 DIAGNOSIS — I872 Venous insufficiency (chronic) (peripheral): Secondary | ICD-10-CM | POA: Diagnosis not present

## 2017-09-23 DIAGNOSIS — M810 Age-related osteoporosis without current pathological fracture: Secondary | ICD-10-CM | POA: Diagnosis present

## 2017-09-23 DIAGNOSIS — R531 Weakness: Secondary | ICD-10-CM | POA: Diagnosis not present

## 2017-09-23 DIAGNOSIS — N179 Acute kidney failure, unspecified: Secondary | ICD-10-CM | POA: Diagnosis not present

## 2017-09-23 DIAGNOSIS — R0789 Other chest pain: Secondary | ICD-10-CM | POA: Diagnosis not present

## 2017-09-23 DIAGNOSIS — Z888 Allergy status to other drugs, medicaments and biological substances status: Secondary | ICD-10-CM

## 2017-09-23 DIAGNOSIS — I161 Hypertensive emergency: Secondary | ICD-10-CM | POA: Diagnosis not present

## 2017-09-23 DIAGNOSIS — R079 Chest pain, unspecified: Secondary | ICD-10-CM | POA: Diagnosis not present

## 2017-09-23 DIAGNOSIS — Z91040 Latex allergy status: Secondary | ICD-10-CM

## 2017-09-23 DIAGNOSIS — I6523 Occlusion and stenosis of bilateral carotid arteries: Secondary | ICD-10-CM

## 2017-09-23 DIAGNOSIS — R42 Dizziness and giddiness: Secondary | ICD-10-CM | POA: Diagnosis not present

## 2017-09-23 DIAGNOSIS — R072 Precordial pain: Secondary | ICD-10-CM | POA: Diagnosis not present

## 2017-09-23 DIAGNOSIS — E871 Hypo-osmolality and hyponatremia: Secondary | ICD-10-CM | POA: Diagnosis not present

## 2017-09-23 DIAGNOSIS — I10 Essential (primary) hypertension: Secondary | ICD-10-CM | POA: Diagnosis not present

## 2017-09-23 DIAGNOSIS — E739 Lactose intolerance, unspecified: Secondary | ICD-10-CM | POA: Diagnosis present

## 2017-09-23 DIAGNOSIS — E876 Hypokalemia: Secondary | ICD-10-CM | POA: Diagnosis present

## 2017-09-23 DIAGNOSIS — F1010 Alcohol abuse, uncomplicated: Secondary | ICD-10-CM | POA: Diagnosis not present

## 2017-09-23 DIAGNOSIS — F329 Major depressive disorder, single episode, unspecified: Secondary | ICD-10-CM | POA: Diagnosis present

## 2017-09-23 DIAGNOSIS — Z79899 Other long term (current) drug therapy: Secondary | ICD-10-CM

## 2017-09-23 DIAGNOSIS — J449 Chronic obstructive pulmonary disease, unspecified: Secondary | ICD-10-CM | POA: Diagnosis present

## 2017-09-23 DIAGNOSIS — Z720 Tobacco use: Secondary | ICD-10-CM | POA: Diagnosis present

## 2017-09-23 DIAGNOSIS — E860 Dehydration: Secondary | ICD-10-CM | POA: Diagnosis not present

## 2017-09-23 DIAGNOSIS — Z8673 Personal history of transient ischemic attack (TIA), and cerebral infarction without residual deficits: Secondary | ICD-10-CM

## 2017-09-23 DIAGNOSIS — R51 Headache: Secondary | ICD-10-CM

## 2017-09-23 DIAGNOSIS — I160 Hypertensive urgency: Secondary | ICD-10-CM | POA: Diagnosis not present

## 2017-09-23 DIAGNOSIS — Z7982 Long term (current) use of aspirin: Secondary | ICD-10-CM

## 2017-09-23 DIAGNOSIS — R519 Headache, unspecified: Secondary | ICD-10-CM | POA: Diagnosis present

## 2017-09-23 LAB — CBC
HCT: 33.2 % — ABNORMAL LOW (ref 36.0–46.0)
HEMOGLOBIN: 12 g/dL (ref 12.0–15.0)
MCH: 36.7 pg — AB (ref 26.0–34.0)
MCHC: 36.1 g/dL — AB (ref 30.0–36.0)
MCV: 101.5 fL — ABNORMAL HIGH (ref 78.0–100.0)
PLATELETS: 195 10*3/uL (ref 150–400)
RBC: 3.27 MIL/uL — ABNORMAL LOW (ref 3.87–5.11)
RDW: 13.3 % (ref 11.5–15.5)
WBC: 7.9 10*3/uL (ref 4.0–10.5)

## 2017-09-23 LAB — BASIC METABOLIC PANEL
ANION GAP: 14 (ref 5–15)
BUN: 9 mg/dL (ref 6–20)
CO2: 20 mmol/L — ABNORMAL LOW (ref 22–32)
Calcium: 9.7 mg/dL (ref 8.9–10.3)
Chloride: 91 mmol/L — ABNORMAL LOW (ref 101–111)
Creatinine, Ser: 1.22 mg/dL — ABNORMAL HIGH (ref 0.44–1.00)
GFR calc Af Amer: 50 mL/min — ABNORMAL LOW (ref >60)
GFR, EST NON AFRICAN AMERICAN: 43 mL/min — AB (ref >60)
GLUCOSE: 112 mg/dL — AB (ref 65–99)
Potassium: 3.8 mmol/L (ref 3.5–5.1)
Sodium: 125 mmol/L — ABNORMAL LOW (ref 135–145)

## 2017-09-23 LAB — I-STAT TROPONIN, ED: TROPONIN I, POC: 0.02 ng/mL (ref 0.00–0.08)

## 2017-09-23 MED ORDER — LOSARTAN POTASSIUM 50 MG PO TABS
50.0000 mg | ORAL_TABLET | Freq: Once | ORAL | Status: AC
  Administered 2017-09-24: 50 mg via ORAL
  Filled 2017-09-23: qty 1

## 2017-09-23 MED ORDER — LORAZEPAM 2 MG/ML IJ SOLN
1.0000 mg | Freq: Once | INTRAMUSCULAR | Status: AC
  Administered 2017-09-24: 1 mg via INTRAVENOUS
  Filled 2017-09-23: qty 1

## 2017-09-23 MED ORDER — ASPIRIN 81 MG PO CHEW
324.0000 mg | CHEWABLE_TABLET | Freq: Once | ORAL | Status: AC
  Administered 2017-09-24: 324 mg via ORAL
  Filled 2017-09-23: qty 4
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
## 2017-09-23 NOTE — ED Notes (Signed)
Pt presented with shaking and central chest pain that she describes as tight.  The CP started yesterday.  The shaking began today.  Pt is hypertensive.  Dr. Mannie Stabile aware and requested pt go to ED immediately.

## 2017-09-23 NOTE — ED Notes (Signed)
Lab work, radiology results and vital signs reviewed, no critical results at this time, no change in acuity indicated.  

## 2017-09-24 ENCOUNTER — Observation Stay (HOSPITAL_COMMUNITY): Payer: PPO

## 2017-09-24 ENCOUNTER — Observation Stay (HOSPITAL_BASED_OUTPATIENT_CLINIC_OR_DEPARTMENT_OTHER): Payer: PPO

## 2017-09-24 ENCOUNTER — Other Ambulatory Visit: Payer: Self-pay

## 2017-09-24 ENCOUNTER — Emergency Department (HOSPITAL_COMMUNITY): Payer: PPO

## 2017-09-24 DIAGNOSIS — R0789 Other chest pain: Principal | ICD-10-CM

## 2017-09-24 DIAGNOSIS — I6521 Occlusion and stenosis of right carotid artery: Secondary | ICD-10-CM | POA: Diagnosis not present

## 2017-09-24 DIAGNOSIS — I160 Hypertensive urgency: Secondary | ICD-10-CM | POA: Diagnosis not present

## 2017-09-24 DIAGNOSIS — R079 Chest pain, unspecified: Secondary | ICD-10-CM

## 2017-09-24 DIAGNOSIS — Z7289 Other problems related to lifestyle: Secondary | ICD-10-CM | POA: Diagnosis present

## 2017-09-24 DIAGNOSIS — N179 Acute kidney failure, unspecified: Secondary | ICD-10-CM | POA: Diagnosis not present

## 2017-09-24 DIAGNOSIS — Z789 Other specified health status: Secondary | ICD-10-CM

## 2017-09-24 DIAGNOSIS — R51 Headache: Secondary | ICD-10-CM

## 2017-09-24 DIAGNOSIS — R519 Headache, unspecified: Secondary | ICD-10-CM | POA: Diagnosis present

## 2017-09-24 DIAGNOSIS — F1090 Alcohol use, unspecified, uncomplicated: Secondary | ICD-10-CM | POA: Diagnosis present

## 2017-09-24 DIAGNOSIS — I6523 Occlusion and stenosis of bilateral carotid arteries: Secondary | ICD-10-CM

## 2017-09-24 DIAGNOSIS — I447 Left bundle-branch block, unspecified: Secondary | ICD-10-CM

## 2017-09-24 DIAGNOSIS — F419 Anxiety disorder, unspecified: Secondary | ICD-10-CM

## 2017-09-24 LAB — CBC
HEMATOCRIT: 30.5 % — AB (ref 36.0–46.0)
HEMOGLOBIN: 10.9 g/dL — AB (ref 12.0–15.0)
MCH: 36.5 pg — ABNORMAL HIGH (ref 26.0–34.0)
MCHC: 35.7 g/dL (ref 30.0–36.0)
MCV: 102 fL — ABNORMAL HIGH (ref 78.0–100.0)
Platelets: 180 10*3/uL (ref 150–400)
RBC: 2.99 MIL/uL — ABNORMAL LOW (ref 3.87–5.11)
RDW: 13.7 % (ref 11.5–15.5)
WBC: 8 10*3/uL (ref 4.0–10.5)

## 2017-09-24 LAB — NM MYOCAR MULTI W/SPECT W/WALL MOTION / EF
CHL CUP RESTING HR STRESS: 74 {beats}/min
CSEPED: 5 min
CSEPEW: 1 METS
MPHR: 148 {beats}/min
Peak HR: 92 {beats}/min
Percent HR: 148 %

## 2017-09-24 LAB — LIPID PANEL
CHOL/HDL RATIO: 1.5 ratio
Cholesterol: 143 mg/dL (ref 0–200)
HDL: 96 mg/dL (ref >40)
LDL CALC: 42 mg/dL (ref 0–99)
Triglycerides: 24 mg/dL (ref <150)
VLDL: 5 mg/dL (ref 0–40)

## 2017-09-24 LAB — RAPID URINE DRUG SCREEN, HOSP PERFORMED
AMPHETAMINES: NOT DETECTED
BARBITURATES: NOT DETECTED
BENZODIAZEPINES: NOT DETECTED
COCAINE: NOT DETECTED
Opiates: NOT DETECTED
TETRAHYDROCANNABINOL: NOT DETECTED

## 2017-09-24 LAB — TSH: TSH: 3.597 u[IU]/mL (ref 0.350–4.500)

## 2017-09-24 LAB — CORTISOL-AM, BLOOD: Cortisol - AM: 22.3 ug/dL (ref 6.7–22.6)

## 2017-09-24 LAB — ECHOCARDIOGRAM COMPLETE
Height: 64 in
Weight: 1603.2 oz

## 2017-09-24 LAB — BASIC METABOLIC PANEL
ANION GAP: 10 (ref 5–15)
BUN: 7 mg/dL (ref 6–20)
CHLORIDE: 94 mmol/L — AB (ref 101–111)
CO2: 22 mmol/L (ref 22–32)
Calcium: 8.3 mg/dL — ABNORMAL LOW (ref 8.9–10.3)
Creatinine, Ser: 0.95 mg/dL (ref 0.44–1.00)
GFR calc Af Amer: >60 mL/min (ref >60)
GFR, EST NON AFRICAN AMERICAN: 58 mL/min — AB (ref >60)
Glucose, Bld: 102 mg/dL — ABNORMAL HIGH (ref 65–99)
POTASSIUM: 3 mmol/L — AB (ref 3.5–5.1)
SODIUM: 126 mmol/L — AB (ref 135–145)

## 2017-09-24 LAB — HEMOGLOBIN A1C
Hgb A1c MFr Bld: 5.4 % (ref 4.8–5.6)
Mean Plasma Glucose: 108.28 mg/dL

## 2017-09-24 LAB — I-STAT TROPONIN, ED: TROPONIN I, POC: 0.02 ng/mL (ref 0.00–0.08)

## 2017-09-24 LAB — TROPONIN I
Troponin I: <0.03 ng/mL (ref <0.03)
Troponin I: <0.03 ng/mL (ref <0.03)

## 2017-09-24 LAB — CREATININE, URINE, RANDOM: Creatinine, Urine: 19.57 mg/dL

## 2017-09-24 MED ORDER — ASPIRIN-ACETAMINOPHEN-CAFFEINE 250-250-65 MG PO TABS
1.0000 | ORAL_TABLET | Freq: Three times a day (TID) | ORAL | Status: DC | PRN
  Administered 2017-09-24 – 2017-09-26 (×2): 1 via ORAL
  Filled 2017-09-24 (×3): qty 1

## 2017-09-24 MED ORDER — NITROGLYCERIN 0.4 MG SL SUBL: 0.4000 mg | SUBLINGUAL_TABLET | SUBLINGUAL | Status: DC | PRN

## 2017-09-24 MED ORDER — ONDANSETRON HCL 4 MG/2ML IJ SOLN: 4.0000 mg | Freq: Four times a day (QID) | INTRAMUSCULAR | Status: DC | PRN

## 2017-09-24 MED ORDER — FLUOXETINE HCL 20 MG PO CAPS
20.0000 mg | ORAL_CAPSULE | Freq: Every day | ORAL | Status: AC
  Administered 2017-09-25: 20 mg via ORAL
  Filled 2017-09-24: qty 1

## 2017-09-24 MED ORDER — THIAMINE HCL 100 MG/ML IJ SOLN
100.0000 mg | Freq: Every day | INTRAMUSCULAR | Status: DC
  Filled 2017-09-24: qty 2

## 2017-09-24 MED ORDER — LYSINE 500 MG PO TABS: 500.0000 mg | ORAL_TABLET | Freq: Two times a day (BID) | ORAL | Status: DC

## 2017-09-24 MED ORDER — LORAZEPAM 2 MG/ML IJ SOLN
0.0000 mg | Freq: Two times a day (BID) | INTRAMUSCULAR | Status: DC
  Administered 2017-09-27: 2 mg via INTRAVENOUS
  Filled 2017-09-24: qty 1

## 2017-09-24 MED ORDER — VITAMIN D 1000 UNITS PO TABS
1000.0000 [IU] | ORAL_TABLET | Freq: Every day | ORAL | Status: DC
  Administered 2017-09-24 – 2017-09-27 (×4): 1000 [IU] via ORAL
  Filled 2017-09-24 (×5): qty 1

## 2017-09-24 MED ORDER — BUSPIRONE HCL 10 MG PO TABS
30.0000 mg | ORAL_TABLET | Freq: Two times a day (BID) | ORAL | Status: DC
  Administered 2017-09-24 – 2017-09-27 (×6): 30 mg via ORAL
  Filled 2017-09-24 (×7): qty 3

## 2017-09-24 MED ORDER — MORPHINE SULFATE (PF) 4 MG/ML IV SOLN: 1.0000 mg | INTRAVENOUS | Status: DC | PRN

## 2017-09-24 MED ORDER — ACETAMINOPHEN 325 MG PO TABS: 650.0000 mg | ORAL_TABLET | ORAL | Status: DC | PRN

## 2017-09-24 MED ORDER — PREDNISONE 5 MG PO TABS
5.0000 mg | ORAL_TABLET | Freq: Every day | ORAL | Status: DC
  Administered 2017-09-24 – 2017-09-27 (×4): 5 mg via ORAL
  Filled 2017-09-24 (×4): qty 1

## 2017-09-24 MED ORDER — TECHNETIUM TC 99M TETROFOSMIN IV KIT
30.0000 | PACK | Freq: Once | INTRAVENOUS | Status: AC | PRN
  Administered 2017-09-24: 30 via INTRAVENOUS

## 2017-09-24 MED ORDER — ZOLPIDEM TARTRATE 5 MG PO TABS
5.0000 mg | ORAL_TABLET | Freq: Every evening | ORAL | Status: DC | PRN
  Administered 2017-09-24 – 2017-09-25 (×2): 5 mg via ORAL
  Filled 2017-09-24 (×3): qty 1

## 2017-09-24 MED ORDER — SODIUM CHLORIDE 0.9 % IV SOLN
INTRAVENOUS | Status: DC
  Administered 2017-09-24: 05:00:00 via INTRAVENOUS

## 2017-09-24 MED ORDER — REGADENOSON 0.4 MG/5ML IV SOLN
INTRAVENOUS | Status: AC
  Administered 2017-09-24: 0.4 mg via INTRAVENOUS
  Filled 2017-09-24: qty 5

## 2017-09-24 MED ORDER — POTASSIUM CHLORIDE CRYS ER 20 MEQ PO TBCR
40.0000 meq | EXTENDED_RELEASE_TABLET | ORAL | Status: AC
  Administered 2017-09-24 (×2): 40 meq via ORAL
  Filled 2017-09-24 (×2): qty 2

## 2017-09-24 MED ORDER — TECHNETIUM TC 99M TETROFOSMIN IV KIT
10.0000 | PACK | Freq: Once | INTRAVENOUS | Status: AC | PRN
  Administered 2017-09-24: 10 via INTRAVENOUS

## 2017-09-24 MED ORDER — ASPIRIN 81 MG PO CHEW: 324.0000 mg | CHEWABLE_TABLET | Freq: Every day | ORAL | Status: DC

## 2017-09-24 MED ORDER — SODIUM CHLORIDE 0.9 % IV BOLUS (SEPSIS)
1000.0000 mL | Freq: Once | INTRAVENOUS | Status: AC
  Administered 2017-09-24: 1000 mL via INTRAVENOUS

## 2017-09-24 MED ORDER — BUPROPION HCL ER (SR) 150 MG PO TB12
150.0000 mg | ORAL_TABLET | Freq: Every day | ORAL | Status: DC
  Administered 2017-09-24 – 2017-09-27 (×4): 150 mg via ORAL
  Filled 2017-09-24 (×5): qty 1

## 2017-09-24 MED ORDER — LORAZEPAM 1 MG PO TABS
1.0000 mg | ORAL_TABLET | Freq: Four times a day (QID) | ORAL | Status: AC | PRN
  Administered 2017-09-26: 1 mg via ORAL
  Filled 2017-09-24: qty 1

## 2017-09-24 MED ORDER — ENOXAPARIN SODIUM 40 MG/0.4ML ~~LOC~~ SOLN
40.0000 mg | Freq: Every day | SUBCUTANEOUS | Status: DC
  Administered 2017-09-24 – 2017-09-27 (×4): 40 mg via SUBCUTANEOUS
  Filled 2017-09-24 (×4): qty 0.4

## 2017-09-24 MED ORDER — NICOTINE 21 MG/24HR TD PT24
21.0000 mg | MEDICATED_PATCH | Freq: Every day | TRANSDERMAL | Status: DC
Start: 2017-09-24 — End: 2017-09-27
  Administered 2017-09-24 – 2017-09-27 (×4): 21 mg via TRANSDERMAL
  Filled 2017-09-24 (×5): qty 1

## 2017-09-24 MED ORDER — REGADENOSON 0.4 MG/5ML IV SOLN
0.4000 mg | Freq: Once | INTRAVENOUS | Status: AC
  Administered 2017-09-24: 0.4 mg via INTRAVENOUS
  Filled 2017-09-24: qty 5

## 2017-09-24 MED ORDER — B COMPLEX-C PO TABS
1.0000 | ORAL_TABLET | Freq: Every day | ORAL | Status: DC
  Administered 2017-09-24 – 2017-09-27 (×4): 1 via ORAL
  Filled 2017-09-24 (×4): qty 1

## 2017-09-24 MED ORDER — CALCIUM CARBONATE-VITAMIN D 500-200 MG-UNIT PO TABS
1.0000 | ORAL_TABLET | Freq: Every day | ORAL | Status: DC
  Administered 2017-09-24 – 2017-09-27 (×4): 1 via ORAL
  Filled 2017-09-24 (×5): qty 1

## 2017-09-24 MED ORDER — HYDRALAZINE HCL 25 MG PO TABS
25.0000 mg | ORAL_TABLET | Freq: Three times a day (TID) | ORAL | Status: DC
  Administered 2017-09-24 – 2017-09-25 (×4): 25 mg via ORAL
  Filled 2017-09-24 (×4): qty 1

## 2017-09-24 MED ORDER — CYCLOSPORINE 0.05 % OP EMUL
1.0000 [drp] | Freq: Two times a day (BID) | OPHTHALMIC | Status: DC
  Administered 2017-09-24 – 2017-09-27 (×7): 1 [drp] via OPHTHALMIC
  Filled 2017-09-24 (×7): qty 1

## 2017-09-24 MED ORDER — VITAMIN B-1 100 MG PO TABS
100.0000 mg | ORAL_TABLET | Freq: Every day | ORAL | Status: DC
  Administered 2017-09-24 – 2017-09-27 (×4): 100 mg via ORAL
  Filled 2017-09-24 (×5): qty 1

## 2017-09-24 MED ORDER — IRBESARTAN 300 MG PO TABS: 300.0000 mg | ORAL_TABLET | Freq: Every day | ORAL | Status: DC

## 2017-09-24 MED ORDER — PANTOPRAZOLE SODIUM 40 MG PO TBEC
40.0000 mg | DELAYED_RELEASE_TABLET | Freq: Every day | ORAL | Status: DC
  Administered 2017-09-24 – 2017-09-27 (×4): 40 mg via ORAL
  Filled 2017-09-24 (×5): qty 1

## 2017-09-24 MED ORDER — FLUOXETINE HCL 20 MG PO TABS
10.0000 mg | ORAL_TABLET | ORAL | Status: DC
Start: 2017-09-24 — End: 2017-09-24

## 2017-09-24 MED ORDER — LORAZEPAM 2 MG/ML IJ SOLN: 1.0000 mg | Freq: Four times a day (QID) | INTRAMUSCULAR | Status: AC | PRN

## 2017-09-24 MED ORDER — ASPIRIN 325 MG PO TABS
325.0000 mg | ORAL_TABLET | Freq: Every day | ORAL | Status: DC
  Filled 2017-09-24: qty 1

## 2017-09-24 MED ORDER — METOCLOPRAMIDE HCL 5 MG/ML IJ SOLN
10.0000 mg | Freq: Once | INTRAMUSCULAR | Status: AC
  Administered 2017-09-24: 10 mg via INTRAVENOUS
  Filled 2017-09-24: qty 2

## 2017-09-24 MED ORDER — MAGNESIUM SULFATE 2 GM/50ML IV SOLN
2.0000 g | Freq: Once | INTRAVENOUS | Status: AC
  Administered 2017-09-24: 2 g via INTRAVENOUS
  Filled 2017-09-24: qty 50

## 2017-09-24 MED ORDER — ASPIRIN EC 81 MG PO TBEC
81.0000 mg | DELAYED_RELEASE_TABLET | Freq: Every day | ORAL | Status: DC
  Administered 2017-09-24 – 2017-09-27 (×4): 81 mg via ORAL
  Filled 2017-09-24 (×4): qty 1

## 2017-09-24 MED ORDER — LORAZEPAM 2 MG/ML IJ SOLN
0.0000 mg | Freq: Four times a day (QID) | INTRAMUSCULAR | Status: AC
  Administered 2017-09-24 – 2017-09-26 (×4): 1 mg via INTRAVENOUS
  Filled 2017-09-24 (×4): qty 1

## 2017-09-24 MED ORDER — HYDRALAZINE HCL 20 MG/ML IJ SOLN: 5.0000 mg | INTRAMUSCULAR | Status: DC | PRN

## 2017-09-24 MED ORDER — FOLIC ACID 1 MG PO TABS
1.0000 mg | ORAL_TABLET | Freq: Every day | ORAL | Status: DC
  Administered 2017-09-24 – 2017-09-27 (×4): 1 mg via ORAL
  Filled 2017-09-24 (×5): qty 1

## 2017-09-24 MED ORDER — IOPAMIDOL (ISOVUE-370) INJECTION 76%
INTRAVENOUS | Status: AC
  Administered 2017-09-24: 50 mL
  Filled 2017-09-24: qty 50

## 2017-09-24 MED ORDER — ALUM & MAG HYDROXIDE-SIMETH 200-200-20 MG/5ML PO SUSP: 30.0000 mL | Freq: Four times a day (QID) | ORAL | Status: DC | PRN

## 2017-09-24 MED ORDER — DILTIAZEM HCL ER COATED BEADS 360 MG PO CP24
360.0000 mg | ORAL_CAPSULE | Freq: Every day | ORAL | Status: DC
  Administered 2017-09-24 – 2017-09-27 (×5): 360 mg via ORAL
  Filled 2017-09-24: qty 1
  Filled 2017-09-24: qty 2
  Filled 2017-09-24 (×2): qty 1
  Filled 2017-09-24 (×4): qty 2
  Filled 2017-09-24: qty 1

## 2017-09-24 MED ORDER — FLUOXETINE HCL 10 MG PO CAPS
10.0000 mg | ORAL_CAPSULE | Freq: Every day | ORAL | Status: AC
  Administered 2017-09-24: 10 mg via ORAL
  Filled 2017-09-24: qty 1

## 2017-09-24 MED ORDER — ATORVASTATIN CALCIUM 40 MG PO TABS
40.0000 mg | ORAL_TABLET | Freq: Every day | ORAL | Status: DC
  Administered 2017-09-24 – 2017-09-27 (×4): 40 mg via ORAL
  Filled 2017-09-24 (×5): qty 1

## 2017-09-24 NOTE — Progress Notes (Signed)
Responded to [facility] to support patient.  Per pt nurse patient gone for Study.  If patient would still like to see Chaplain nurse page. Chaplain will follow as needed.  Jaclynn Major, [place], [facility], Pager [phone]

## 2017-09-24 NOTE — ED Notes (Signed)
Patient transported to CT 

## 2017-09-24 NOTE — Consult Note (Signed)
Cardiology Consultation:   Patient ID: Regina Ortiz; [id number]; [birth year]   Admit date: 09/23/2017 Date of Consult: 09/24/2017  Primary Care Provider: Maurice Small, MD Primary Cardiologist: Candee Furbish, MD   Patient Profile:   Regina Ortiz is a 72 y.o. female with a hx of LBBB, tobacco use, depression, rheumatoid arthritis, chronic hyponatremia, GERD, COPD, HTN who is being seen today for the evaluation of chest pain at the request of Dr. Eliseo Squires.  History of Present Illness:   Regina Ortiz lost her husband to suicide on December 1st. She is stressed, dealing with the estate now. For the last 2 weeks she has been experiencing a frontal pressure type headache which seems to be very concerning to her. She also developed chest discomfort that is pressure-like in the center to upper sternal area, non radiating and constant. This has been progressively worse until was severe yesterday and she presented to the ED. Her chest pain is worse with deep breathing, coughing, and is tender to palpation. She admits to shortness of breath with the pain, but admits to long standing DOE related to her smoking. She is vague about whether it is worse now. She did have some mild nausea and feeling alternating hot and cold yesterday. She also had waves of dizziness while sitting and standing yesterday which she has not noted prior to that. She denies any presyncope or syncope. She sleeps slightly elevated and is unable to say if it is due to breathing difficulty.  She continues to smoke.    CTA if the head with no acute findings.  CTA of the neck showed Moderate to advanced atheromatous plaque throughout the carotid siphons with resultant severe near occlusive cavernous right ICA stenosis.  Past Medical History:  Diagnosis Date  . AIN grade I   . Anxiety   . Bruises easily   . Chronic diarrhea   . Chronic hyponatremia   . Colitis   . COPD (chronic obstructive pulmonary disease) ([place])   . Depression   . GERD  (gastroesophageal reflux disease)   . History of chronic bronchitis   . History of vulvar dysplasia    serveral recurrency's   . Hypertension   . LBBB (left bundle branch block) W/ PROLONGED PR   CARDIOLOGSIT-  DR Johnsie Cancel-   LOV IN EPIC  . Macrocytosis   . Nocturia   . Osteoporosis   . Seronegative rheumatoid arthritis ([place])   . Short of breath on exertion   . Smokers' cough ([place])   . Venous insufficiency of leg    , Edema  . VIN III (vulvar intraepithelial neoplasia III)    RECURRENT -    Past Surgical History:  Procedure Laterality Date  . APPENDECTOMY  1992  . CARDIOVASCULAR STRESS TEST  06-23-2012  DR Jenkins Rouge   NORMAL LEXISCAN MYOVIEW/ EF 83%/  NO ISCHEMIA  . CO2 LASER APPLICATION  40/98/1191   Procedure: CO2 LASER APPLICATION;  Surgeon: Janie Morning, MD PHD;  Location: [facility];  Service: Gynecology;  Laterality: N/A;  Laser Vaporization  . CO2 LASER APPLICATION N/A 12/06/8293   Procedure: CO2 LASER APPLICATION;  Surgeon: [name], MD;  Location: [facility];  Service: Gynecology;  Laterality: N/A;  . CO2 LASER APPLICATION N/A 02/19/3085   Procedure: CO2 LASER APPLICATION of the vulva;  Surgeon: Janie Morning, MD;  Location: [facility];  Service: Gynecology;  Laterality: N/A;  . EXCISION VULVAR LESIONS  07/2012  . HEMORRHOID SURGERY N/A 04/15/2013  Procedure: Excision of external and internal  hemorrhoid with AIN, Exam under anesthesia;  Surgeon: Odis Hollingshead, MD;  Location: [facility];  Service: General;  Laterality: N/A;  . REPAIR FISTULA IN ANO/  I & D PERIRECTAL ABSCESS  10-26-2002  DR Johnathan Hausen  . TRANSTHORACIC ECHOCARDIOGRAM  06-23-2012   NORMAL LVSF/ EF 55-60%  . VAGINAL HYSTERECTOMY  1997   partial  . VULVAR LESION REMOVAL N/A 11/03/2014   Procedure: CO2 LASER OF VULVAR ;  Surgeon: [name], MD;  Location: [facility];  Service: Gynecology;  Laterality: N/A;  . VULVECTOMY N/A 03/26/2015    Procedure: WIDE LOCA  EXCISION OF VULVAR;  Surgeon: [name], MD;  Location: [place];  Service: Gynecology;  Laterality: N/A;  . Wide Local Excision of labia majora  04-04-2010   Right-sided lesion, CO2 ablation of right labia minora     Home Medications:  Prior to Admission medications   Medication Sig Start Date End Date Taking? Authorizing Provider  atorvastatin (LIPITOR) 40 MG tablet TAKE ONE TABLET BY MOUTH ONCE DAILY 09/17/17  Yes Jerline Pain, MD  b complex vitamins capsule Take 1 capsule by mouth daily.   Yes [provider]  BIOTIN PO Take 5,000 mg by mouth 2 (two) times daily.   Yes [provider]  buPROPion (WELLBUTRIN SR) 150 MG 12 hr tablet Take 150 mg by mouth daily.    Yes [provider]  busPIRone (BUSPAR) 30 MG tablet Take 30 mg by mouth 2 (two) times daily.  11/20/14  Yes [provider]  Calcium Carbonate-Vitamin D (CALTRATE 600+D PO) Take 1 tablet by mouth daily.    Yes [provider]  Cholecalciferol (VITAMIN D3) 1000 UNITS CAPS Take 1,000 Units by mouth daily.    Yes [provider]  cycloSPORINE (RESTASIS) 0.05 % ophthalmic emulsion Place 1 drop into both eyes 2 (two) times daily.   Yes [provider]  diltiazem (TIAZAC) 360 MG 24 hr capsule Take 360 mg by mouth daily.    Yes [provider]  FLUoxetine (PROZAC) 20 MG tablet Take 10-20 mg by mouth See admin instructions. Take 1/2 tablet daily for 7 days then increase to 1 tablet daily 09/18/17  Yes [provider]  Lysine 500 MG TABS Take 500 mg by mouth 2 (two) times daily.    Yes [provider]  omeprazole (PRILOSEC) 20 MG capsule Take 20 mg by mouth every other day.    Yes [provider]  Potassium (POTASSIMIN PO) Take 1 tablet by mouth every evening.    Yes [provider]  predniSONE (DELTASONE) 5 MG tablet Take 5 mg by mouth daily with breakfast. --  LONG-TERM USE FOR RA   Yes  [provider]  PRESCRIPTION MEDICATION Take 1 tablet by mouth at bedtime. New Medication for Sleep   Yes [provider]  valsartan (DIOVAN) 320 MG tablet Take 320 mg by mouth every evening.    Yes [provider]  fluconazole (DIFLUCAN) 100 MG tablet Take 1 tablet (100 mg total) by mouth as directed. Patient not taking: Reported on 09/24/2017 06/09/16   Joylene John D, NP    Inpatient Medications: Scheduled Meds: . aspirin  325 mg Oral Daily  . atorvastatin  40 mg Oral Daily  . B-complex with vitamin C  1 tablet Oral Daily  . buPROPion  150 mg Oral Daily  . busPIRone  30 mg Oral BID  . calcium-vitamin D  1 tablet Oral Q breakfast  . cholecalciferol  1,000 Units Oral Daily  . cycloSPORINE  1 drop Both Eyes BID  . diltiazem  360 mg Oral Daily  . enoxaparin (LOVENOX) injection  40 mg Subcutaneous Daily  . FLUoxetine  10 mg Oral Daily   Followed by  . [START ON 09/25/2017] FLUoxetine  20 mg Oral Daily  . folic acid  1 mg Oral Daily  . hydrALAZINE  25 mg Oral Q8H  . LORazepam  0-4 mg Intravenous Q6H   Followed by  . [START ON 09/26/2017] LORazepam  0-4 mg Intravenous Q12H  . nicotine  21 mg Transdermal Daily  . pantoprazole  40 mg Oral Daily  . predniSONE  5 mg Oral Q breakfast  . thiamine  100 mg Oral Daily   Or  . thiamine  100 mg Intravenous Daily   Continuous Infusions: . sodium chloride 75 mL/hr at 09/24/17 0633   PRN Meds: acetaminophen, alum & mag hydroxide-simeth, aspirin-acetaminophen-caffeine, hydrALAZINE, LORazepam **OR** LORazepam, morphine injection, nitroGLYCERIN, ondansetron (ZOFRAN) IV, zolpidem  Allergies:    Allergies  Allergen Reactions  . Latex Swelling and Other (See Comments)     fever blisters  . Doxycycline Other (See Comments)    Blurred vision, nervous, unsteady  . Lactose Intolerance (Gi)     Unknown, per pt  . Lialda [Mesalamine] Diarrhea  . Prinivil [Lisinopril] Diarrhea  . Triamcinolone Acetonide Other (See  Comments)    REDNESS AND PAIN  . Amlodipine Rash  . Lotensin [Benazepril Hcl] Rash    Social History:   Social History   Socioeconomic History  . Marital status: Married    Spouse name: Not on file  . Number of children: Not on file  . Years of education: Not on file  . Highest education level: Not on file  Social Needs  . Financial resource strain: Not on file  . Food insecurity - worry: Not on file  . Food insecurity - inability: Not on file  . Transportation needs - medical: Not on file  . Transportation needs - non-medical: Not on file  Occupational History  . Not on file  Tobacco Use  . Smoking status: Current Every Day Smoker    Packs/day: 0.25    Years: 50.00    Pack years: 12.50    Types: Cigarettes  . Smokeless tobacco: Never Used  Substance and Sexual Activity  . Alcohol use: Yes    Alcohol/week: 1.2 oz    Types: 2 Cans of beer per week  . Drug use: No  . Sexual activity: Yes  Other Topics Concern  . Not on file  Social History Narrative  . Not on file    Family History:    Family History  Problem Relation Age of Onset  . Diabetes Mother   . Hypertension Father   . Lymphoma Son   . Cancer Son        lymphoblastic lymphoma     ROS:  Please see the history of present illness.  ROS  All other ROS reviewed and negative.     Physical Exam/Data:   Vitals:   09/24/17 0300 09/24/17 0400 09/24/17 0415 09/24/17 0459  BP: (!) 175/89 (!) 165/74 (!) 174/85 (!) 170/74  Pulse: 79 71 73 73  Resp: 19 14 14 15   Temp:    98 F (36.7 C)  TempSrc:    Oral  SpO2: 97% 97% 97% 99%  Weight:    100 lb 3.2 oz (45.5 kg)  Height:  5\' 4"  (1.626 m)    Intake/Output Summary (Last 24 hours) at 09/24/2017 0741 Last data filed at 09/24/2017 4098 Gross per 24 hour  Intake 1097.5 ml  Output 150 ml  Net 947.5 ml   Filed Weights   09/24/17 0137 09/24/17 0459  Weight: 103 lb (46.7 kg) 100 lb 3.2 oz (45.5 kg)   Body mass index is 17.2 kg/m.  General:  Thin,  elderly female, in no acute distress HEENT: normal Lymph: no adenopathy Neck: no JVD Endocrine:  No thryomegaly Vascular: Bilateral carotid bruits; FA pulses 2+ bilaterally  Cardiac:  normal S1, S2; RRR; 2/6 systolic murmur RUSB Lungs:  clear to auscultation bilaterally, except faint expiratory wheezes Abd: soft, nontender, no hepatomegaly  Ext: trace ankle edema Musculoskeletal:  No deformities, BUE and BLE strength normal and equal Skin: warm and dry  Neuro:  CNs 2-12 intact, no focal abnormalities noted Psych:  Mildly anxious affect   EKG:  The EKG was personally reviewed and demonstrates:  NSR 72 bpm, LBBB, no changes from previous. QTC 501 Telemetry:  Telemetry was personally reviewed and demonstrates:  Sinus rhythm in the 70's  Relevant CV Studies:  Most recent echo 06/23/2012 Study Conclusions  - Left ventricle: The cavity size was normal. Systolic function was normal. The estimated ejection fraction was in the range of 55% to 60%. Wall motion was normal; there were no regional wall motion abnormalities. - Atrial septum: No defect or patent foramen ovale was identified.  Laboratory Data:  Chemistry Recent Labs  Lab 09/23/17 1856  NA 125*  K 3.8  CL 91*  CO2 20*  GLUCOSE 112*  BUN 9  CREATININE 1.22*  CALCIUM 9.7  GFRNONAA 43*  GFRAA 50*  ANIONGAP 14    No results for input(s): PROT, ALBUMIN, AST, ALT, ALKPHOS, BILITOT in the last 168 hours. Hematology Recent Labs  Lab 09/23/17 1856  WBC 7.9  RBC 3.27*  HGB 12.0  HCT 33.2*  MCV 101.5*  MCH 36.7*  MCHC 36.1*  RDW 13.3  PLT 195   Cardiac Enzymes Recent Labs  Lab 09/24/17 0343  TROPONINI <0.03    Recent Labs  Lab 09/23/17 1906 09/24/17 0041  TROPIPOC 0.02 0.02    BNPNo results for input(s): BNP, PROBNP in the last 168 hours.  DDimer No results for input(s): DDIMER in the last 168 hours.  Radiology/Studies:  Ct Angio Head W Or Wo Contrast  Result Date: 09/24/2017 CLINICAL  DATA:  Initial evaluation for acute headache. EXAM: CT ANGIOGRAPHY HEAD AND NECK TECHNIQUE: Multidetector CT imaging of the head and neck was performed using the standard protocol during bolus administration of intravenous contrast. Multiplanar CT image reconstructions and MIPs were obtained to evaluate the vascular anatomy. Carotid stenosis measurements (when applicable) are obtained utilizing NASCET criteria, using the distal internal carotid diameter as the denominator. CONTRAST:  54mL ISOVUE-370 IOPAMIDOL (ISOVUE-370) INJECTION 76% COMPARISON:  None. FINDINGS: CT HEAD FINDINGS Brain: Cerebral volume within normal limits for patient age. Mild chronic microvascular changes. No evidence for acute intracranial hemorrhage. No findings to suggest acute large vessel territory infarct. No mass lesion, midline shift, or mass effect. Ventricles are normal in size without evidence for hydrocephalus. 1.6 x 2.9 cm arachnoid cyst overlies the right frontal convexity (series 4, image 20). No other extra-axial fluid collection. Vascular: No hyperdense vessel identified. Calcified atherosclerosis present at the skull base. Skull: Scalp soft tissues demonstrate no acute abnormality. Calvarium intact. Sinuses/Orbits: Globes and orbital soft tissues within normal limits. Moderate layering opacity within  left sphenoid sinus. Paranasal sinuses are otherwise clear. Trace bilateral mastoid effusions. CTA NECK FINDINGS Aortic arch: Moderate calcified plaque within the partially visualized aortic arch. Origin of the great vessels not included on this exam. Visualized subclavian arteries patent without stenosis. Right carotid system: Right common carotid artery widely patent to the bifurcation. A centric calcified plaque about the right bifurcation/proximal right ICA with associated stenosis of up to 50% by NASCET criteria. Right ICA tortuous but widely patent to the skull base without additional stenosis. Left carotid system: Visualized  left common carotid artery patent to the bifurcation without flow-limiting stenosis. A centric calcified plaque about the left bifurcation without hemodynamically significant stenosis. Left ICA tortuous but widely patent to the skull base. Vertebral arteries: Both of the vertebral arteries arise from the subclavian arteries. Left vertebral artery slightly dominant. Vertebral arteries tortuous but widely patent to the skull base without stenosis, dissection or occlusion. Skeleton: No acute osseus abnormality. No worrisome lytic or blastic osseous lesions. Other neck: No acute soft tissue abnormality within the neck. Parotid glands are atrophic bilaterally. Submandibular glands normal. Subcentimeter hypodense nodule noted within the right lobe of thyroid, of doubtful significance. Thyroid otherwise unremarkable. No adenopathy. Upper chest: Visualized upper chest demonstrates no acute abnormality. Visualized upper lungs are clear. Centrilobular emphysema. Review of the MIP images confirms the above findings CTA HEAD FINDINGS Anterior circulation: Petrous segments patent bilaterally. Extensive calcified atherosclerosis throughout the cavernous and supraclinoid ICAs. There is a resultant severe high-grade stenosis at the anterior genu of the cavernous right ICA (series 11, image 116). Additional moderate multifocal narrowing throughout the remainder the cavernous/supraclinoid segments. ICA termini patent. Left A1 segment patent. Right A1 segment hypoplastic but faintly visible. Normal anterior communicating artery. Anterior cerebral arteries widely patent to their distal aspects without stenosis. M1 segments patent without stenosis. MCA branches well perfused and symmetric but demonstrate moderate small vessel atheromatous irregularity. Posterior circulation: Vertebral arteries widely patent to the vertebrobasilar junction. Posterior inferior cerebral arteries patent bilaterally. Basilar artery widely patent to its  distal aspect. Superior cerebellar and posterior cerebral arteries widely patent bilaterally. Small bilateral posterior communicating arteries noted. Venous sinuses: Patent. Anatomic variants: None significant. No aneurysm or vascular abnormality. Delayed phase: No abnormal enhancement. Review of the MIP images confirms the above findings IMPRESSION: 1. Negative CTA for large vessel occlusion. No acute vascular abnormality identified. No aneurysm. No acute intracranial process. 2. Moderate to advanced atheromatous plaque throughout the carotid siphons with resultant severe near occlusive cavernous right ICA stenosis. 3. Atheromatous stenosis at the right carotid bifurcation of approximately 50% by NASCET criteria. 4. Approximate 3 cm benign arachnoid cyst overlying the right frontal convexity. 5. Emphysema. Electronically Signed   By: Jeannine Boga M.D.   On: 09/24/2017 02:19   Dg Chest 2 View  Result Date: 09/23/2017 CLINICAL DATA:  Chest tightness and shortness of breath EXAM: CHEST  2 VIEW COMPARISON:  Chest radiograph 06/22/2012 FINDINGS: The lungs are hyperinflated. The heart size and mediastinal contours are within normal limits. There is calcific aortic atherosclerosis. Both lungs are clear. The visualized skeletal structures are unremarkable. IMPRESSION: Hyperinflated lungs, consistent with COPD. Unchanged calcific aortic atherosclerosis without focal airspace disease or pulmonary edema. Aortic Atherosclerosis (ICD10-I70.0) and Emphysema (ICD10-J43.9). Electronically Signed   By: Ulyses Jarred M.D.   On: 09/23/2017 20:12   Ct Angio Neck W And/or Wo Contrast  Result Date: 09/24/2017 CLINICAL DATA:  Initial evaluation for acute headache. EXAM: CT ANGIOGRAPHY HEAD AND NECK TECHNIQUE: Multidetector CT imaging of the  head and neck was performed using the standard protocol during bolus administration of intravenous contrast. Multiplanar CT image reconstructions and MIPs were obtained to evaluate the  vascular anatomy. Carotid stenosis measurements (when applicable) are obtained utilizing NASCET criteria, using the distal internal carotid diameter as the denominator. CONTRAST:  95mL ISOVUE-370 IOPAMIDOL (ISOVUE-370) INJECTION 76% COMPARISON:  None. FINDINGS: CT HEAD FINDINGS Brain: Cerebral volume within normal limits for patient age. Mild chronic microvascular changes. No evidence for acute intracranial hemorrhage. No findings to suggest acute large vessel territory infarct. No mass lesion, midline shift, or mass effect. Ventricles are normal in size without evidence for hydrocephalus. 1.6 x 2.9 cm arachnoid cyst overlies the right frontal convexity (series 4, image 20). No other extra-axial fluid collection. Vascular: No hyperdense vessel identified. Calcified atherosclerosis present at the skull base. Skull: Scalp soft tissues demonstrate no acute abnormality. Calvarium intact. Sinuses/Orbits: Globes and orbital soft tissues within normal limits. Moderate layering opacity within left sphenoid sinus. Paranasal sinuses are otherwise clear. Trace bilateral mastoid effusions. CTA NECK FINDINGS Aortic arch: Moderate calcified plaque within the partially visualized aortic arch. Origin of the great vessels not included on this exam. Visualized subclavian arteries patent without stenosis. Right carotid system: Right common carotid artery widely patent to the bifurcation. A centric calcified plaque about the right bifurcation/proximal right ICA with associated stenosis of up to 50% by NASCET criteria. Right ICA tortuous but widely patent to the skull base without additional stenosis. Left carotid system: Visualized left common carotid artery patent to the bifurcation without flow-limiting stenosis. A centric calcified plaque about the left bifurcation without hemodynamically significant stenosis. Left ICA tortuous but widely patent to the skull base. Vertebral arteries: Both of the vertebral arteries arise from the  subclavian arteries. Left vertebral artery slightly dominant. Vertebral arteries tortuous but widely patent to the skull base without stenosis, dissection or occlusion. Skeleton: No acute osseus abnormality. No worrisome lytic or blastic osseous lesions. Other neck: No acute soft tissue abnormality within the neck. Parotid glands are atrophic bilaterally. Submandibular glands normal. Subcentimeter hypodense nodule noted within the right lobe of thyroid, of doubtful significance. Thyroid otherwise unremarkable. No adenopathy. Upper chest: Visualized upper chest demonstrates no acute abnormality. Visualized upper lungs are clear. Centrilobular emphysema. Review of the MIP images confirms the above findings CTA HEAD FINDINGS Anterior circulation: Petrous segments patent bilaterally. Extensive calcified atherosclerosis throughout the cavernous and supraclinoid ICAs. There is a resultant severe high-grade stenosis at the anterior genu of the cavernous right ICA (series 11, image 116). Additional moderate multifocal narrowing throughout the remainder the cavernous/supraclinoid segments. ICA termini patent. Left A1 segment patent. Right A1 segment hypoplastic but faintly visible. Normal anterior communicating artery. Anterior cerebral arteries widely patent to their distal aspects without stenosis. M1 segments patent without stenosis. MCA branches well perfused and symmetric but demonstrate moderate small vessel atheromatous irregularity. Posterior circulation: Vertebral arteries widely patent to the vertebrobasilar junction. Posterior inferior cerebral arteries patent bilaterally. Basilar artery widely patent to its distal aspect. Superior cerebellar and posterior cerebral arteries widely patent bilaterally. Small bilateral posterior communicating arteries noted. Venous sinuses: Patent. Anatomic variants: None significant. No aneurysm or vascular abnormality. Delayed phase: No abnormal enhancement. Review of the MIP  images confirms the above findings IMPRESSION: 1. Negative CTA for large vessel occlusion. No acute vascular abnormality identified. No aneurysm. No acute intracranial process. 2. Moderate to advanced atheromatous plaque throughout the carotid siphons with resultant severe near occlusive cavernous right ICA stenosis. 3. Atheromatous stenosis at the right carotid bifurcation  of approximately 50% by NASCET criteria. 4. Approximate 3 cm benign arachnoid cyst overlying the right frontal convexity. 5. Emphysema. Electronically Signed   By: Jeannine Boga M.D.   On: 09/24/2017 02:19    Assessment and Plan:   Chest pain -Hx LBBB, Last nuclear stress test was normal in 2013 -No new changes on EKG, known LBBB -CXR consistent with COPD, unchanged calcific aortic atherosclerosis, no pulmonary edema -Previous CTA of the chest in 06/2012 demonstrated atherosclerotic calcifications of the coronary arteries.  -CVD risk factors include smoking, HTN, age, vascular disease elsewhere (carotids) -Pt has mostly atypical symptoms-constant chest pressure worse with deep breathing, cough, tender to palpation, but also some worrisome symptoms like DOE, dizziness yesterday -Would do ischemic evaluation. Pt is NPO, will do lexiscan myoview (has LBBB). Check echo.   Severe right carotid artery stenosis -Bilateral carotid bruits present, L>R -carotid ultrasound in 09/2015 showed, 1-39% RICA stenosis, 00-86% LICA stenosis, elevated velocities due to tortuosity. -Pt with breif waves of dizziness with sitting or standing yesterday, none prior to that. Has had frontal pressure headache, constant for the last 2 weeks. No syncope.  -Per CTA: Moderate to advanced atheromatous plaque throughout the carotid siphons with resultant severe near occlusive cavernous right ICA stenosis.Atheromatous stenosis at the right carotid bifurcation of approximately 50% by NASCET criteria.  -Will need vascular consult.  -Is on statin, no  longer on aspirin as had easy bruising  Hypertension -Home meds include diltiazem 360 mg and Diovan 320 mg -BP is elevated, ARB is on hold. Diltiazem continued. Hydralazine has been added. Continue to monitor   AKI -SCr 1.22. Usually has normal renal function. Had some elevated SCr in 2014, resolved.  -IV fluids infusing. Continue to monitor  Depression -Depressive episode related to suicide of her husband on 08/01/17. Is stressed dealing with his loss and settling the estate.  -On Buspar and Wellbutrin  Tobacco use -Continue to smoke, advise cessation  For questions or updates, please contact [place] Please consult [email or web address] for contact info under Cardiology/STEMI.   Signed, Daune Perch, NP  09/24/2017 7:41 AM

## 2017-09-24 NOTE — Consult Note (Signed)
Hospital Consult VASCULAR SURGERY ASSESSMENT & PLAN:   This is a pleasant 72 year old woman who recently witnessed her husband commit suicide.  She has been under a tremendous amount of stress and presented with chest pain and a frontal headache.  Her workup included a CT angiogram which showed some intracranial carotid disease and vascular surgery was consulted.  She denies any history of stroke, TIAs, expressive or receptive aphasia, or amaurosis fugax.  She has no focal weakness on exam.  On CT angiogram of the neck she has an approximately 50% right carotid stenosis.  She has no significant left carotid stenosis.  She does have some intracranial disease bilaterally.  I have ordered a carotid duplex scan as I think this is better at determining the severity of her extracranial carotid disease.  She is asymptomatic we would only consider carotid endarterectomy if she had a greater than 80% stenosis.  Certainly given her situation I think her chest pain and headache could be related to her stress.  She has undergone a stress test today and these results are pending.  Pending the results of her duplex I will decide on what type of follow-up she needs with me.  Regina Mayo, MD, [facility] [phone] Office: [phone]  Reason for Consult:  Right ICA stenosis Requesting Physician:  Dr. Eliseo Squires MRN #:  [id number]  History of Present Illness: We have been consulted secondary to right ICA sever stenosis.  This is a 72 y.o. female who presented to the hospital yesterday for HA and chest pain that had gotten progressively worse and severe yesterday.  She reports history of stroke, no weakness amaurosis, or aphasia.  She does have shortness of breath with activity but contributes this to smoking.  She has been under stress as her husband died last month and she is dealing with his estate.  She is on medication for depression.  She does have HTN, but it is reported that she was taken off of her ARB  and put on hydralazine due to renal insufficiency.  Her creatinine was elevated in 2014 but is usually within normal range.   Her creatinine yesterday was 1.22.   She is also on Diltiazem as well as her ARB. The pt is on a statin for cholesterol management.  She takes a daily aspirin.  She is a current every day smoker and has COPD.  She was hyponatremic upon admission.    She is undergoing a stress test this morning per cardiology.    Past Medical History:  Diagnosis Date  . AIN grade I   . Anxiety   . Bruises easily   . Chronic diarrhea   . Chronic hyponatremia   . Colitis   . COPD (chronic obstructive pulmonary disease) ([place])   . Depression   . GERD (gastroesophageal reflux disease)   . History of chronic bronchitis   . History of vulvar dysplasia    serveral recurrency's   . Hypertension   . LBBB (left bundle branch block) W/ PROLONGED PR   CARDIOLOGSIT-  DR Johnsie Cancel-   LOV IN EPIC  . Macrocytosis   . Nocturia   . Osteoporosis   . Seronegative rheumatoid arthritis ([place])   . Short of breath on exertion   . Smokers' cough ([place])   . Venous insufficiency of leg    , Edema  . VIN III (vulvar intraepithelial neoplasia III)    RECURRENT -    Past Surgical History:  Procedure Laterality Date  . APPENDECTOMY  1992  .  CARDIOVASCULAR STRESS TEST  06-23-2012  DR Jenkins [place]   NORMAL LEXISCAN MYOVIEW/ EF 83%/  NO ISCHEMIA  . CO2 LASER APPLICATION  40/34/7425   Procedure: CO2 LASER APPLICATION;  Surgeon: Janie Morning, MD PHD;  Location: [facility];  Service: Gynecology;  Laterality: N/A;  Laser Vaporization  . CO2 LASER APPLICATION N/A 05/06/6386   Procedure: CO2 LASER APPLICATION;  Surgeon: [name], MD;  [facility]  Service: Gynecology;  Laterality: N/A;  . CO2 LASER APPLICATION N/A 5/64/3329   Procedure: CO2 LASER APPLICATION of the vulva;  Surgeon: Janie Morning, MD;  Location: [facility];  Service: Gynecology;   Laterality: N/A;  . EXCISION VULVAR LESIONS  07/2012  . HEMORRHOID SURGERY N/A 04/15/2013   Procedure: Excision of external and internal  hemorrhoid with AIN, Exam under anesthesia;  Surgeon: Odis Hollingshead, MD;  Location: [facility];  Service: General;  Laterality: N/A;  . REPAIR FISTULA IN ANO/  I & D PERIRECTAL ABSCESS  10-26-2002  DR Johnathan Hausen  . TRANSTHORACIC ECHOCARDIOGRAM  06-23-2012   NORMAL LVSF/ EF 55-60%  . VAGINAL HYSTERECTOMY  1997   partial  . VULVAR LESION REMOVAL N/A 11/03/2014   Procedure: CO2 LASER OF VULVAR ;  Surgeon: [name], MD;  Location: [facility];  Service: Gynecology;  Laterality: N/A;  . VULVECTOMY N/A 03/26/2015   Procedure: WIDE LOCA  EXCISION OF VULVAR;  Surgeon: [name], MD;  Location: [place];  Service: Gynecology;  Laterality: N/A;  . Wide Local Excision of labia majora  04-04-2010   Right-sided lesion, CO2 ablation of right labia minora    Allergies  Allergen Reactions  . Latex Swelling and Other (See Comments)     fever blisters  . Doxycycline Other (See Comments)    Blurred vision, nervous, unsteady  . Lactose Intolerance ([place])     Unknown, per pt  . Lialda [Mesalamine] Diarrhea  . Prinivil [Lisinopril] Diarrhea  . Triamcinolone Acetonide Other (See Comments)    REDNESS AND PAIN  . Amlodipine Rash  . Lotensin [Benazepril Hcl] Rash    Prior to Admission medications   Medication Sig Start Date End Date Taking? Authorizing Provider  atorvastatin (LIPITOR) 40 MG tablet TAKE ONE TABLET BY MOUTH ONCE DAILY 09/17/17  Yes Jerline Pain, MD  b complex vitamins capsule Take 1 capsule by mouth daily.   Yes [provider]  BIOTIN PO Take 5,000 mg by mouth 2 (two) times daily.   Yes [provider]  buPROPion (WELLBUTRIN SR) 150 MG 12 hr tablet Take 150 mg by mouth daily.    Yes [provider]  busPIRone (BUSPAR) 30 MG tablet Take 30 mg by mouth 2 (two) times daily.  11/20/14  Yes  [provider]  Calcium Carbonate-Vitamin D (CALTRATE 600+D PO) Take 1 tablet by mouth daily.    Yes [provider]  Cholecalciferol (VITAMIN D3) 1000 UNITS CAPS Take 1,000 Units by mouth daily.    Yes [provider]  cycloSPORINE (RESTASIS) 0.05 % ophthalmic emulsion Place 1 drop into both eyes 2 (two) times daily.   Yes [provider]  diltiazem (TIAZAC) 360 MG 24 hr capsule Take 360 mg by mouth daily.    Yes [provider]  FLUoxetine (PROZAC) 20 MG tablet Take 10-20 mg by mouth See admin instructions. Take 1/2 tablet daily for 7 days then increase to 1 tablet daily 09/18/17  Yes [provider]  Lysine 500  MG TABS Take 500 mg by mouth 2 (two) times daily.    Yes [provider]  omeprazole (PRILOSEC) 20 MG capsule Take 20 mg by mouth every other day.    Yes [provider]  Potassium (POTASSIMIN PO) Take 1 tablet by mouth every evening.    Yes [provider]  predniSONE (DELTASONE) 5 MG tablet Take 5 mg by mouth daily with breakfast. --  LONG-TERM USE FOR RA   Yes [provider]  PRESCRIPTION MEDICATION Take 1 tablet by mouth at bedtime. New Medication for Sleep   Yes [provider]  valsartan (DIOVAN) 320 MG tablet Take 320 mg by mouth every evening.    Yes [provider]  fluconazole (DIFLUCAN) 100 MG tablet Take 1 tablet (100 mg total) by mouth as directed. Patient not taking: Reported on 09/24/2017 06/09/16   Dorothyann Gibbs, NP    Social History   Socioeconomic History  . Marital status: Married    Spouse name: Not on file  . Number of children: Not on file  . Years of education: Not on file  . Highest education level: Not on file  Social Needs  . Financial resource strain: Not on file  . Food insecurity - worry: Not on file  . Food insecurity - inability: Not on file  . Transportation needs - medical: Not on file  . Transportation needs - non-medical: Not on file    Occupational History  . Not on file  Tobacco Use  . Smoking status: Current Every Day Smoker    Packs/day: 0.25    Years: 50.00    Pack years: 12.50    Types: Cigarettes  . Smokeless tobacco: Never Used  Substance and Sexual Activity  . Alcohol use: Yes    Alcohol/week: 1.2 oz    Types: 2 Cans of beer per week  . Drug use: No  . Sexual activity: Yes  Other Topics Concern  . Not on file  Social History Narrative  . Not on file     Family History  Problem Relation Age of Onset  . Diabetes Mother   . Hypertension Father   . Lymphoma Son   . Cancer Son        lymphoblastic lymphoma    ROS: [x]  Positive   [ ]  Negative   [ ]  All sytems reviewed and are negative  Cardiac: [x]  chest pain/pressure []  palpitations [x]  SOB lying flat [x]  DOE  Vascular: []  pain in legs while walking []  pain in legs at rest []  pain in legs at night []  non-healing ulcers []  hx of DVT []  swelling in legs  Pulmonary: []  productive cough []  asthma/wheezing []  home O2 [x]  COPD  Neurologic: []  weakness in []  arms []  legs []  numbness in []  arms []  legs []  hx of CVA []  mini stroke [] difficulty speaking or slurred speech []  temporary loss of vision in one eye []  dizziness  Hematologic: []  hx of cancer []  bleeding problems []  problems with blood clotting easily  Endocrine:   []  diabetes []  thyroid disease  GI []  vomiting blood []  blood in stool [x]  GERD  GU: []  CKD/renal failure []  HD--[]  M/W/F or []  T/T/S []  burning with urination []  blood in urine  Psychiatric: [x]  anxiety [x]  depression  Musculoskeletal: [x]  arthritis-RA []  joint pain  Integumentary: []  rashes []  ulcers  Constitutional: []  fever []  chills   Physical Examination  Vitals:   09/24/17 0459 09/24/17 0801  BP: (!) 170/74 (!) 175/80  Pulse: 73 (!) 104  Resp: 15   Temp: 98 F (36.7 C) 98.1 F (36.7 C)  SpO2: 99% 99%   Body mass index is 17.2 kg/m.  General:  WDWN in NAD Gait: Not  observed HENT: WNL, normocephalic Pulmonary: normal non-labored breathing, without Rales, rhonchi,  wheezing Cardiac: regular, without  Murmurs, rubs or gallops; without carotid bruits Abdomen:  soft, NT/ND, no masses Skin: without rashes Vascular Exam/Pulses:  Right Left  Radial 2+ (normal) 2+ (normal)  Ulnar Not palapble Not palpable  Brachial 2+ (normal) Not palpable secondary to IV placement  Femoral 2+ (normal) 2+ (normal)  Popliteal 2+ (normal) 2+ (normal)  DP 2+ (normal) 2+ (normal)  PT 1+ (weak) 1+ (weak)  Peroneal Not palpable Not palpable   Extremities: without ischemic changes, without Gangrene , without cellulitis; without open wounds;  Musculoskeletal: no muscle wasting or atrophy  Neurologic: A&O X 3;  No focal weakness or paresthesias are detected; speech is fluent/normal Psychiatric:  The pt has Depressed affect. Lymph:  Not palpable   CBC    Component Value Date/Time   WBC 7.9 09/23/2017 1856   RBC 3.27 (L) 09/23/2017 1856   HGB 12.0 09/23/2017 1856   HCT 33.2 (L) 09/23/2017 1856   PLT 195 09/23/2017 1856   MCV 101.5 (H) 09/23/2017 1856   MCH 36.7 (H) 09/23/2017 1856   MCHC 36.1 (H) 09/23/2017 1856   RDW 13.3 09/23/2017 1856   LYMPHSABS 1.2 04/12/2013 1525   MONOABS 0.3 04/12/2013 1525   EOSABS 0.0 04/12/2013 1525   BASOSABS 0.0 04/12/2013 1525    BMET    Component Value Date/Time   NA 125 (L) 09/23/2017 1856   K 3.8 09/23/2017 1856   CL 91 (L) 09/23/2017 1856   CO2 20 (L) 09/23/2017 1856   GLUCOSE 112 (H) 09/23/2017 1856   BUN 9 09/23/2017 1856   CREATININE 1.22 (H) 09/23/2017 1856   CALCIUM 9.7 09/23/2017 1856   GFRNONAA 43 (L) 09/23/2017 1856   GFRAA 50 (L) 09/23/2017 1856    COAGS: Lab Results  Component Value Date   INR 0.91 04/12/2013   INR 0.96 06/22/2012     Non-Invasive Vascular Imaging:   CT head/neck 09/24/17: IMPRESSION: 1. Negative CTA for large vessel occlusion. No acute vascular abnormality identified. No aneurysm. No  acute intracranial process. 2. Moderate to advanced atheromatous plaque throughout the carotid siphons with resultant severe near occlusive cavernous right ICA Stenosis.   Extensive calcified atherosclerosis throughout the cavernous and supraclinoid ICAs. There is a resultant severe high-grade stenosis at the anterior genu of the cavernous right ICA  3. Atheromatous stenosis at the right carotid bifurcation of approximately 50% by NASCET criteria. 4. Approximate 3 cm benign arachnoid cyst overlying the right frontal convexity. 5. Emphysema.  Statin:  Yes.   Beta Blocker:  No. Aspirin:  Yes.   ACEI:  No. ARB:  Yes.   CCB use:  Yes Other antiplatelets/anticoagulants:  Yes.   Lovenox   ASSESSMENT/PLAN: This is a 72 y.o. female  Chest Pain HA with high grade right ICA stenosis above the skull base  During her cardiac work up they discovered right ICA stenosis above the skull base.  She has a 2 day history of HA frontal and posterior into the neck.  She states she has never had a HA prior to this.  The vertebral arteries are patent and the left ICA has calcification at the bifurcation without hemodynamically significant stenosis.  I'm not sure if this is a stenosis  that is treatable by stenting since it is high above the skull base.  She reports history of stroke, no weakness amaurosis, or aphasia.  I will discuss these findings with dr.Joshua Zeringue.    Continue medical management with daily Stain, BP control and encourage smoking cessation.     Roxy Horseman  PA-C [facility] [phone]

## 2017-09-24 NOTE — Progress Notes (Signed)
Patient admitted after midnight, please see H&P. Recently lost her husband to a violent suicide that she witnessed.  Has counseling set up with hospice soon.  Came in with chest pain.  Stress test and echo pending.  Also had headache and dizziness with occulusion of the RICA.  Vascular consulted.  Tobacco cessation urged.  Eulogio Bear DO

## 2017-09-24 NOTE — ED Provider Notes (Signed)
[facility] Provider Note   CSN: [id number] Arrival date & time: 09/23/17  [place]     History   Chief Complaint Chief Complaint  Patient presents with  . Chest Pain    HPI Regina Ortiz is a 72 y.o. female.  HPI  72 year old female comes in with chief complaint of chest pain. Patient has history of hypertension, left bundle branch block, COPD and chronic hyponatremia. Patient states that she has been feeling unwell the last couple of weeks.  Her husband unfortunately committed suicide about 2 months ago, and she has been attributing her symptoms to that event.  Over the past 2 days however patient has had constant chest pain that is midsternal, with radiation to the left shoulder.  Patient is also noted P pain around her teeth and jaws.  Her pain is not exertional, or worse with inspiration or any positions.  Patient has had some shortness of breath episodes associated with the chest pain.  Patient denies any diaphoresis, nausea.  Patient is also having dizziness, and headaches.  Patient states that her headache has been constant for the last 1 day and is located in the front and also in the back of her neck.  Patient's dizziness has been intermittent, and it is present spontaneously without any specific evoking factors.  Patient denies any focal numbness, tingling, weakness, vision changes.  Past Medical History:  Diagnosis Date  . AIN grade I   . Anxiety   . Bruises easily   . Chronic diarrhea   . Chronic hyponatremia   . Colitis   . COPD (chronic obstructive pulmonary disease) ([place])   . Depression   . GERD (gastroesophageal reflux disease)   . History of chronic bronchitis   . History of vulvar dysplasia    serveral recurrency's   . Hypertension   . LBBB (left bundle branch block) W/ PROLONGED PR   CARDIOLOGSIT-  DR Johnsie Cancel-   LOV IN EPIC  . Macrocytosis   . Nocturia   . Osteoporosis   . Seronegative rheumatoid arthritis ([place])   . Short of breath on  exertion   . Smokers' cough ([place])   . Venous insufficiency of leg    , Edema  . VIN III (vulvar intraepithelial neoplasia III)    RECURRENT -    Patient Active Problem List   Diagnosis Date Noted  . Hypertensive urgency 09/24/2017  . Headache 09/24/2017  . Alcohol use 09/24/2017  . AKI (acute kidney injury) ([place]) 09/24/2017  . Bilateral carotid artery stenosis   . Vulvovaginal candidiasis 10/22/2015  . Mild malnutrition ([place]) 07/07/2013  . Chronic bronchitis ([place]) 07/07/2013  . Tobacco abuse 07/07/2013  . Hypertension   . Short of breath on exertion   . Hemorrhoid   . LBBB (left bundle branch block)   . Anxiety   . Depression   . RA (rheumatoid arthritis) ([place])   . Venous insufficiency of leg   . Chronic hyponatremia   . AIN grade I 04/05/2013  . Diarrhea 09/22/2012  . Hyponatremia 09/22/2012  . Hypokalemia 09/22/2012  . ARF (acute renal failure) ([place]) 09/22/2012  . Atypical chest pain 06/22/2012  . Anemia 06/22/2012  . Vulvar intraepithelial neoplasia III (VIN III) 01/29/2012    Past Surgical History:  Procedure Laterality Date  . APPENDECTOMY  1992  . CARDIOVASCULAR STRESS TEST  06-23-2012  DR Jenkins [place]   NORMAL LEXISCAN MYOVIEW/ EF 83%/  NO ISCHEMIA  . CO2 LASER APPLICATION  34/74/2595   Procedure:  CO2 LASER APPLICATION;  Surgeon: Janie Morning, MD PHD;  Location: [facility];  Service: Gynecology;  Laterality: N/A;  Laser Vaporization  . CO2 LASER APPLICATION N/A 05/04/8181   Procedure: CO2 LASER APPLICATION;  Surgeon: [name], MD;  Location: [facility];  Service: Gynecology;  Laterality: N/A;  . CO2 LASER APPLICATION N/A 9/93/7169   Procedure: CO2 LASER APPLICATION of the vulva;  Surgeon: Janie Morning, MD;  [facility];  Service: Gynecology;  Laterality: N/A;  . EXCISION VULVAR LESIONS  07/2012  . HEMORRHOID SURGERY N/A 04/15/2013   Procedure: Excision of external and internal  hemorrhoid with AIN,  Exam under anesthesia;  Surgeon: Odis Hollingshead, MD;  Location: [facility];  Service: General;  Laterality: N/A;  . REPAIR FISTULA IN ANO/  I & D PERIRECTAL ABSCESS  10-26-2002  DR Johnathan Hausen  . TRANSTHORACIC ECHOCARDIOGRAM  06-23-2012   NORMAL LVSF/ EF 55-60%  . VAGINAL HYSTERECTOMY  1997   partial  . VULVAR LESION REMOVAL N/A 11/03/2014   Procedure: CO2 LASER OF VULVAR ;  Surgeon: [name], MD;  [facility];  Service: Gynecology;  Laterality: N/A;  . VULVECTOMY N/A 03/26/2015   Procedure: WIDE LOCA  EXCISION OF VULVAR;  Surgeon: [name], MD;  Location: [place] [facility];  Service: Gynecology;  Laterality: N/A;  . Wide Local Excision of labia majora  04-04-2010   Right-sided lesion, CO2 ablation of right labia minora    OB History    No data available       Home Medications    Prior to Admission medications   Medication Sig Start Date End Date Taking? Authorizing Provider  atorvastatin (LIPITOR) 40 MG tablet TAKE ONE TABLET BY MOUTH ONCE DAILY Patient taking differently: TAKE ONE TABLET( 40mg ) BY MOUTH ONCE DAILY 09/17/17  Yes Jerline Pain, MD  b complex vitamins capsule Take 1 capsule by mouth daily.   Yes [provider]  BIOTIN PO Take 5,000 mg by mouth 2 (two) times daily.   Yes [provider]  buPROPion (WELLBUTRIN SR) 150 MG 12 hr tablet Take 150 mg by mouth daily.    Yes [provider]  busPIRone (BUSPAR) 30 MG tablet Take 30 mg by mouth 2 (two) times daily.  11/20/14  Yes [provider]  Calcium Carbonate-Vitamin D (CALTRATE 600+D PO) Take 1 tablet by mouth daily.    Yes [provider]  Cholecalciferol (VITAMIN D3) 1000 UNITS CAPS Take 1,000 Units by mouth daily.    Yes [provider]  cycloSPORINE (RESTASIS) 0.05 % ophthalmic emulsion Place 1 drop into both eyes 2 (two) times daily.   Yes [provider]  diltiazem (TIAZAC) 360 MG 24 hr capsule Take 360 mg by mouth  daily.    Yes [provider]  FLUoxetine (PROZAC) 20 MG tablet Take 10-20 mg by mouth See admin instructions. Take 1/2 tablet daily for 7 days then increase to 1 tablet daily 09/18/17  Yes [provider]  Lysine 500 MG TABS Take 500 mg by mouth 2 (two) times daily.    Yes [provider]  omeprazole (PRILOSEC) 20 MG capsule Take 20 mg by mouth every other day.    Yes [provider]  Potassium (POTASSIMIN PO) Take 1 tablet by mouth every evening.    Yes [provider]  predniSONE (DELTASONE) 5 MG tablet Take 5 mg by mouth daily with breakfast. --  LONG-TERM USE FOR RA  Yes [provider]  PRESCRIPTION MEDICATION Take 1 tablet by mouth at bedtime. New Medication for Sleep   Yes [provider]  valsartan (DIOVAN) 320 MG tablet Take 320 mg by mouth every evening.    Yes [provider]  zolpidem (AMBIEN) 10 MG tablet Take 10 mg by mouth at bedtime as needed for sleep.   Yes [provider]    Family History Family History  Problem Relation Age of Onset  . Diabetes Mother   . Hypertension Father   . Lymphoma Son   . Cancer Son        lymphoblastic lymphoma    Social History Social History   Tobacco Use  . Smoking status: Current Every Day Smoker    Packs/day: 0.25    Years: 50.00    Pack years: 12.50    Types: Cigarettes  . Smokeless tobacco: Never Used  Substance Use Topics  . Alcohol use: Yes    Alcohol/week: 1.2 oz    Types: 2 Cans of beer per week  . Drug use: No     Allergies   Latex; Doxycycline; Lactose intolerance (gi); Lialda [mesalamine]; Prinivil [lisinopril]; Triamcinolone acetonide; Amlodipine; and Lotensin [benazepril hcl]   Review of Systems Review of Systems  Constitutional: Positive for activity change.  Respiratory: Positive for chest tightness and shortness of breath.   Cardiovascular: Positive for chest pain.  Gastrointestinal: Negative for nausea.  Neurological:  Positive for dizziness.  All other systems reviewed and are negative.    Physical Exam Updated Vital Signs BP (!) 166/68 (BP Location: Right Arm)   Pulse 81   Temp 98 F (36.7 C) (Oral)   Resp 16   Ht 5\' 4"  (1.626 m)   Wt 45.5 kg (100 lb 3.2 oz)   LMP  (LMP Unknown)   SpO2 98%   BMI 17.20 kg/m   Physical Exam  Constitutional: She is oriented to person, place, and time. She appears well-developed.  HENT:  Head: Normocephalic and atraumatic.  Eyes: EOM are normal.  Neck: Normal range of motion. Neck supple.  Cardiovascular: Normal rate, intact distal pulses and normal pulses.  Pulmonary/Chest: Effort normal.  Abdominal: Bowel sounds are normal.  Musculoskeletal:       Right lower leg: She exhibits no edema.       Left lower leg: She exhibits no edema.  Neurological: She is alert and oriented to person, place, and time. She is not disoriented. No cranial nerve deficit.  Skin: Skin is warm and dry.  Nursing note and vitals reviewed.    ED Treatments / Results  Labs (all labs ordered are listed, but only abnormal results are displayed) Labs Reviewed  BASIC METABOLIC PANEL - Abnormal; Notable for the following components:      Result Value   Sodium 125 (*)    Chloride 91 (*)    CO2 20 (*)    Glucose, Bld 112 (*)    Creatinine, Ser 1.22 (*)    GFR calc non Af Amer 43 (*)    GFR calc Af Amer 50 (*)    All other components within normal limits  CBC - Abnormal; Notable for the following components:   RBC 3.27 (*)    HCT 33.2 (*)    MCV 101.5 (*)    MCH 36.7 (*)    MCHC 36.1 (*)    All other components within normal limits  BASIC METABOLIC PANEL - Abnormal; Notable for the following components:   Sodium 126 (*)  Potassium 3.0 (*)    Chloride 94 (*)    Glucose, Bld 102 (*)    Calcium 8.3 (*)    GFR calc non Af Amer 58 (*)    All other components within normal limits  CBC - Abnormal; Notable for the following components:   RBC 2.99 (*)    Hemoglobin 10.9 (*)      HCT 30.5 (*)    MCV 102.0 (*)    MCH 36.5 (*)    All other components within normal limits  CORTISOL-AM, BLOOD  HEMOGLOBIN A1C  LIPID PANEL  TROPONIN I  TROPONIN I  TROPONIN I  RAPID URINE DRUG SCREEN, HOSP PERFORMED  TSH  CREATININE, URINE, RANDOM  UREA NITROGEN, URINE  BASIC METABOLIC PANEL  I-STAT TROPONIN, ED  I-STAT TROPONIN, ED    EKG  EKG Interpretation  Date/Time:  Wednesday September 23 2017 18:50:07 EST Ventricular Rate:  94 PR Interval:  190 QRS Duration: 128 QT Interval:  392 QTC Calculation: 490 R Axis:   55 Text Interpretation:  Normal sinus rhythm Left bundle branch block Abnormal ECG No STEMI. LBBB is old.  Confirmed by Nanda Quinton [phone]) on 09/23/2017 6:57:43 PM Also confirmed by Nanda Quinton [phone]), editor Hattie Perch [phone])  on 09/24/2017 7:10:41 AM       Radiology Ct Angio Head W Or Wo Contrast  Result Date: 09/24/2017 CLINICAL DATA:  Initial evaluation for acute headache. EXAM: CT ANGIOGRAPHY HEAD AND NECK TECHNIQUE: Multidetector CT imaging of the head and neck was performed using the standard protocol during bolus administration of intravenous contrast. Multiplanar CT image reconstructions and MIPs were obtained to evaluate the vascular anatomy. Carotid stenosis measurements (when applicable) are obtained utilizing NASCET criteria, using the distal internal carotid diameter as the denominator. CONTRAST:  78mL ISOVUE-370 IOPAMIDOL (ISOVUE-370) INJECTION 76% COMPARISON:  None. FINDINGS: CT HEAD FINDINGS Brain: Cerebral volume within normal limits for patient age. Mild chronic microvascular changes. No evidence for acute intracranial hemorrhage. No findings to suggest acute large vessel territory infarct. No mass lesion, midline shift, or mass effect. Ventricles are normal in size without evidence for hydrocephalus. 1.6 x 2.9 cm arachnoid cyst overlies the right frontal convexity (series 4, image 20). No other extra-axial fluid collection. Vascular:  No hyperdense vessel identified. Calcified atherosclerosis present at the skull base. Skull: Scalp soft tissues demonstrate no acute abnormality. Calvarium intact. Sinuses/Orbits: Globes and orbital soft tissues within normal limits. Moderate layering opacity within left sphenoid sinus. Paranasal sinuses are otherwise clear. Trace bilateral mastoid effusions. CTA NECK FINDINGS Aortic arch: Moderate calcified plaque within the partially visualized aortic arch. Origin of the great vessels not included on this exam. Visualized subclavian arteries patent without stenosis. Right carotid system: Right common carotid artery widely patent to the bifurcation. A centric calcified plaque about the right bifurcation/proximal right ICA with associated stenosis of up to 50% by NASCET criteria. Right ICA tortuous but widely patent to the skull base without additional stenosis. Left carotid system: Visualized left common carotid artery patent to the bifurcation without flow-limiting stenosis. A centric calcified plaque about the left bifurcation without hemodynamically significant stenosis. Left ICA tortuous but widely patent to the skull base. Vertebral arteries: Both of the vertebral arteries arise from the subclavian arteries. Left vertebral artery slightly dominant. Vertebral arteries tortuous but widely patent to the skull base without stenosis, dissection or occlusion. Skeleton: No acute osseus abnormality. No worrisome lytic or blastic osseous lesions. Other neck: No acute soft tissue abnormality within the neck. Parotid glands are  atrophic bilaterally. Submandibular glands normal. Subcentimeter hypodense nodule noted within the right lobe of thyroid, of doubtful significance. Thyroid otherwise unremarkable. No adenopathy. Upper chest: Visualized upper chest demonstrates no acute abnormality. Visualized upper lungs are clear. Centrilobular emphysema. Review of the MIP images confirms the above findings CTA HEAD FINDINGS  Anterior circulation: Petrous segments patent bilaterally. Extensive calcified atherosclerosis throughout the cavernous and supraclinoid ICAs. There is a resultant severe high-grade stenosis at the anterior genu of the cavernous right ICA (series 11, image 116). Additional moderate multifocal narrowing throughout the remainder the cavernous/supraclinoid segments. ICA termini patent. Left A1 segment patent. Right A1 segment hypoplastic but faintly visible. Normal anterior communicating artery. Anterior cerebral arteries widely patent to their distal aspects without stenosis. M1 segments patent without stenosis. MCA branches well perfused and symmetric but demonstrate moderate small vessel atheromatous irregularity. Posterior circulation: Vertebral arteries widely patent to the vertebrobasilar junction. Posterior inferior cerebral arteries patent bilaterally. Basilar artery widely patent to its distal aspect. Superior cerebellar and posterior cerebral arteries widely patent bilaterally. Small bilateral posterior communicating arteries noted. Venous sinuses: Patent. Anatomic variants: None significant. No aneurysm or vascular abnormality. Delayed phase: No abnormal enhancement. Review of the MIP images confirms the above findings IMPRESSION: 1. Negative CTA for large vessel occlusion. No acute vascular abnormality identified. No aneurysm. No acute intracranial process. 2. Moderate to advanced atheromatous plaque throughout the carotid siphons with resultant severe near occlusive cavernous right ICA stenosis. 3. Atheromatous stenosis at the right carotid bifurcation of approximately 50% by NASCET criteria. 4. Approximate 3 cm benign arachnoid cyst overlying the right frontal convexity. 5. Emphysema. Electronically Signed   By: Jeannine Boga M.D.   On: 09/24/2017 02:19   Dg Chest 2 View  Result Date: 09/23/2017 CLINICAL DATA:  Chest tightness and shortness of breath EXAM: CHEST  2 VIEW COMPARISON:  Chest  radiograph 06/22/2012 FINDINGS: The lungs are hyperinflated. The heart size and mediastinal contours are within normal limits. There is calcific aortic atherosclerosis. Both lungs are clear. The visualized skeletal structures are unremarkable. IMPRESSION: Hyperinflated lungs, consistent with COPD. Unchanged calcific aortic atherosclerosis without focal airspace disease or pulmonary edema. Aortic Atherosclerosis (ICD10-I70.0) and Emphysema (ICD10-J43.9). Electronically Signed   By: Ulyses Jarred M.D.   On: 09/23/2017 20:12   Ct Angio Neck W And/or Wo Contrast  Result Date: 09/24/2017 CLINICAL DATA:  Initial evaluation for acute headache. EXAM: CT ANGIOGRAPHY HEAD AND NECK TECHNIQUE: Multidetector CT imaging of the head and neck was performed using the standard protocol during bolus administration of intravenous contrast. Multiplanar CT image reconstructions and MIPs were obtained to evaluate the vascular anatomy. Carotid stenosis measurements (when applicable) are obtained utilizing NASCET criteria, using the distal internal carotid diameter as the denominator. CONTRAST:  52mL ISOVUE-370 IOPAMIDOL (ISOVUE-370) INJECTION 76% COMPARISON:  None. FINDINGS: CT HEAD FINDINGS Brain: Cerebral volume within normal limits for patient age. Mild chronic microvascular changes. No evidence for acute intracranial hemorrhage. No findings to suggest acute large vessel territory infarct. No mass lesion, midline shift, or mass effect. Ventricles are normal in size without evidence for hydrocephalus. 1.6 x 2.9 cm arachnoid cyst overlies the right frontal convexity (series 4, image 20). No other extra-axial fluid collection. Vascular: No hyperdense vessel identified. Calcified atherosclerosis present at the skull base. Skull: Scalp soft tissues demonstrate no acute abnormality. Calvarium intact. Sinuses/Orbits: Globes and orbital soft tissues within normal limits. Moderate layering opacity within left sphenoid sinus. Paranasal  sinuses are otherwise clear. Trace bilateral mastoid effusions. CTA NECK FINDINGS Aortic arch: Moderate  calcified plaque within the partially visualized aortic arch. Origin of the great vessels not included on this exam. Visualized subclavian arteries patent without stenosis. Right carotid system: Right common carotid artery widely patent to the bifurcation. A centric calcified plaque about the right bifurcation/proximal right ICA with associated stenosis of up to 50% by NASCET criteria. Right ICA tortuous but widely patent to the skull base without additional stenosis. Left carotid system: Visualized left common carotid artery patent to the bifurcation without flow-limiting stenosis. A centric calcified plaque about the left bifurcation without hemodynamically significant stenosis. Left ICA tortuous but widely patent to the skull base. Vertebral arteries: Both of the vertebral arteries arise from the subclavian arteries. Left vertebral artery slightly dominant. Vertebral arteries tortuous but widely patent to the skull base without stenosis, dissection or occlusion. Skeleton: No acute osseus abnormality. No worrisome lytic or blastic osseous lesions. Other neck: No acute soft tissue abnormality within the neck. Parotid glands are atrophic bilaterally. Submandibular glands normal. Subcentimeter hypodense nodule noted within the right lobe of thyroid, of doubtful significance. Thyroid otherwise unremarkable. No adenopathy. Upper chest: Visualized upper chest demonstrates no acute abnormality. Visualized upper lungs are clear. Centrilobular emphysema. Review of the MIP images confirms the above findings CTA HEAD FINDINGS Anterior circulation: Petrous segments patent bilaterally. Extensive calcified atherosclerosis throughout the cavernous and supraclinoid ICAs. There is a resultant severe high-grade stenosis at the anterior genu of the cavernous right ICA (series 11, image 116). Additional moderate multifocal  narrowing throughout the remainder the cavernous/supraclinoid segments. ICA termini patent. Left A1 segment patent. Right A1 segment hypoplastic but faintly visible. Normal anterior communicating artery. Anterior cerebral arteries widely patent to their distal aspects without stenosis. M1 segments patent without stenosis. MCA branches well perfused and symmetric but demonstrate moderate small vessel atheromatous irregularity. Posterior circulation: Vertebral arteries widely patent to the vertebrobasilar junction. Posterior inferior cerebral arteries patent bilaterally. Basilar artery widely patent to its distal aspect. Superior cerebellar and posterior cerebral arteries widely patent bilaterally. Small bilateral posterior communicating arteries noted. Venous sinuses: Patent. Anatomic variants: None significant. No aneurysm or vascular abnormality. Delayed phase: No abnormal enhancement. Review of the MIP images confirms the above findings IMPRESSION: 1. Negative CTA for large vessel occlusion. No acute vascular abnormality identified. No aneurysm. No acute intracranial process. 2. Moderate to advanced atheromatous plaque throughout the carotid siphons with resultant severe near occlusive cavernous right ICA stenosis. 3. Atheromatous stenosis at the right carotid bifurcation of approximately 50% by NASCET criteria. 4. Approximate 3 cm benign arachnoid cyst overlying the right frontal convexity. 5. Emphysema. Electronically Signed   By: Jeannine Boga M.D.   On: 09/24/2017 02:19   Nm Myocar Multi W/spect W/wall Motion / Ef  Result Date: 09/24/2017  There was no ST segment deviation noted during stress.  Defect 1: There is a medium defect of moderate severity present in the basal anteroseptal, basal inferoseptal, mid anteroseptal, mid inferoseptal and apical septal location. This septal attenuation appears to be due to the LBBB  Nuclear stress EF: 68%. The left ventricular ejection fraction is hyperdynamic  (>65%).  This is a low risk study. The study is normal.     Procedures Procedures (including critical care time)  Medications Ordered in ED Medications  atorvastatin (LIPITOR) tablet 40 mg (40 mg Oral Given 09/25/17 1046)  B-complex with vitamin C tablet 1 tablet (1 tablet Oral Given 09/25/17 1047)  buPROPion (WELLBUTRIN SR) 12 hr tablet 150 mg (150 mg Oral Given 09/25/17 1046)  busPIRone (BUSPAR) tablet 30 mg (  30 mg Oral Given 09/25/17 1047)  calcium-vitamin D (OSCAL WITH D) 500-200 MG-UNIT per tablet 1 tablet (1 tablet Oral Given 09/25/17 1046)  cholecalciferol (VITAMIN D) tablet 1,000 Units (1,000 Units Oral Given 09/25/17 1046)  cycloSPORINE (RESTASIS) 0.05 % ophthalmic emulsion 1 drop (1 drop Both Eyes Given 09/25/17 1047)  diltiazem (CARDIZEM CD) 24 hr capsule 360 mg (360 mg Oral Given 09/25/17 1047)  pantoprazole (PROTONIX) EC tablet 40 mg (40 mg Oral Given 09/25/17 1046)  predniSONE (DELTASONE) tablet 5 mg (5 mg Oral Given 09/25/17 1046)  aspirin-acetaminophen-caffeine (EXCEDRIN MIGRAINE) per tablet 1 tablet (1 tablet Oral Given 09/24/17 1339)  nitroGLYCERIN (NITROSTAT) SL tablet 0.4 mg (not administered)  morphine 4 MG/ML injection 1 mg (not administered)  nicotine (NICODERM CQ - dosed in mg/24 hours) patch 21 mg (21 mg Transdermal Patch Applied 09/25/17 1055)  LORazepam (ATIVAN) tablet 1 mg (not administered)    Or  LORazepam (ATIVAN) injection 1 mg (not administered)  thiamine (VITAMIN B-1) tablet 100 mg (100 mg Oral Given 09/25/17 1046)    Or  thiamine (B-1) injection 100 mg ( Intravenous See Alternative 11/27/49 8841)  folic acid (FOLVITE) tablet 1 mg (1 mg Oral Given 09/25/17 1047)  LORazepam (ATIVAN) injection 0-4 mg (0 mg Intravenous Not Given 09/25/17 1803)    Followed by  LORazepam (ATIVAN) injection 0-4 mg (not administered)  alum & mag hydroxide-simeth (MAALOX/MYLANTA) 200-200-20 MG/5ML suspension 30 mL (not administered)  acetaminophen (TYLENOL) tablet 650 mg (not administered)   ondansetron (ZOFRAN) injection 4 mg (not administered)  enoxaparin (LOVENOX) injection 40 mg (40 mg Subcutaneous Given 09/25/17 1047)  zolpidem (AMBIEN) tablet 5 mg (5 mg Oral Given 09/24/17 2108)  hydrALAZINE (APRESOLINE) injection 5 mg (not administered)  aspirin EC tablet 81 mg (81 mg Oral Given 09/25/17 1046)  hydrALAZINE (APRESOLINE) tablet 50 mg (50 mg Oral Given 09/25/17 1422)  camphor-menthol (SARNA) lotion (not administered)  feeding supplement (BOOST / RESOURCE BREEZE) liquid 1 Container (not administered)  LORazepam (ATIVAN) injection 1 mg (1 mg Intravenous Given 09/24/17 0031)  aspirin chewable tablet 324 mg (324 mg Oral Given 09/24/17 0035)  losartan (COZAAR) tablet 50 mg (50 mg Oral Given 09/24/17 0136)  metoCLOPramide (REGLAN) injection 10 mg (10 mg Intravenous Given 09/24/17 0058)  sodium chloride 0.9 % bolus 1,000 mL (0 mLs Intravenous Stopped 09/24/17 0250)  iopamidol (ISOVUE-370) 76 % injection (50 mLs  Contrast Given 09/24/17 0110)  FLUoxetine (PROZAC) capsule 10 mg (10 mg Oral Given 09/24/17 1338)    Followed by  FLUoxetine (PROZAC) capsule 20 mg (20 mg Oral Given 09/25/17 1046)  regadenoson (LEXISCAN) injection SOLN 0.4 mg (0.4 mg Intravenous Given 09/24/17 1208)  technetium tetrofosmin (TC-MYOVIEW) injection 10 millicurie (10 millicuries Intravenous Contrast Given 09/24/17 1135)  technetium tetrofosmin (TC-MYOVIEW) injection 30 millicurie (30 millicuries Intravenous Contrast Given 09/24/17 1210)  magnesium sulfate IVPB 2 g 50 mL (0 g Intravenous Stopped 09/24/17 2015)  potassium chloride SA (K-DUR,KLOR-CON) CR tablet 40 mEq (40 mEq Oral Given 09/24/17 1950)  potassium chloride SA (K-DUR,KLOR-CON) CR tablet 40 mEq (40 mEq Oral Given 09/25/17 1613)     Initial Impression / Assessment and Plan / ED Course  I have reviewed the triage vital signs and the nursing notes.  Pertinent labs & imaging results that were available during my care of the patient were reviewed by me and considered  in my medical decision making (see chart for details).     72 year old female comes in with chief complaint of chest pain.  Patient has no known  significant coronary artery disease, chest pain has been constant however it does have some typical features such as radiation to the left shoulder and some associated jaw pain.  Patient is EKG shows left bundle branch block which is not new, however there is some ST changes that are nonspecific in nature.  Initial troponin is negative.  It also appears that patient could be having an anxiety component to the chest discomfort.  Patient has been taking Zoloft and BuSpar for her anxiety, so acutely, anxiety will be diagnosis of exclusion.  Patient is also noted to be hypertensive and she reports having constant headaches/neck pains along with intermittent dizziness.  Neuro exam is nonfocal.  Again patient's symptoms could be related to the elevated blood pressure, but in the setting of headache/neck pain, we will make sure patient is not having vertebral dissection.  Neuro exam is nonfocal.  We will start with Ativan, Reglan to treat headache and possible anxiety.  Patient's systolic blood pressure was higher than 200 during my evaluation with map in the 110s -so if her blood pressure does not get better with Ativan, we will get a bit aggressive in managing her blood pressure.  Final Clinical Impressions(s) / ED Diagnoses   Final diagnoses:  Acute hyponatremia  Hypertensive emergency  Chest pain, unspecified type  Bad headache  Dizziness    ED Discharge Orders    None       Varney Biles, MD 09/25/17 [phone]

## 2017-09-24 NOTE — ED Notes (Signed)
Hospitalist at bedside 

## 2017-09-24 NOTE — ED Notes (Signed)
ED Provider at bedside. 

## 2017-09-24 NOTE — Progress Notes (Signed)
PHARMACIST - PHYSICIAN ORDER COMMUNICATION  CONCERNING: P&T Medication Policy on Herbal Medications  DESCRIPTION:  This patient's order for: Lysine  has been noted.  This product(s) is classified as an "herbal" or natural product. Due to a lack of definitive safety studies or FDA approval, nonstandard manufacturing practices, plus the potential risk of unknown drug-drug interactions while on inpatient medications, the Pharmacy and Therapeutics Committee does not permit the use of "herbal" or natural products of this type within [place].   ACTION TAKEN: The pharmacy department is unable to verify this order at this time and your patient has been informed of this safety policy. Please reevaluate patient's clinical condition at discharge and address if the herbal or natural product(s) should be resumed at that time.  

## 2017-09-24 NOTE — Care Management Obs Status (Signed)
[name] NOTIFICATION   Patient Details  Name: Regina Ortiz MRN: [id number] Date of Birth: 07-04-1946   Medicare Observation Status Notification Given:  Yes    Carles Collet, RN 09/24/2017, 2:15 PM

## 2017-09-24 NOTE — Progress Notes (Signed)
Echocardiogram 2D Echocardiogram has been performed.  Regina Ortiz 09/24/2017, 10:47 AM

## 2017-09-24 NOTE — H&P (Signed)
History and Physical    Regina Ortiz NKN:[id number] DOB: [birth year] DOA: 09/23/2017  Referring MD/NP/PA:   PCP: Maurice Small, MD   Patient coming from:  The patient is coming from home.  At baseline, pt is independent for most of ADL.  Chief Complaint: Chest pain, headache  HPI: Regina Ortiz is a 72 y.o. female with medical history significant of hypertension, hyperlipidemia, COPD, GERD, depression, anxiety, rheumatoid arthritis, left bundle blockage, chronic hyponatremia, tobacco abuse, alcohol use, who presents with chest pain and headache.  Patient states that she has been having chest pain in the past 2 days. The chest pain is located in the substernal area, sharp and also pressure-like pain, constant, radiating to left arm and jaw. She also has frontal chest wall tenderness which is aggravated by deep breath. It is associated with mild SOB and lightheadedness. Patient also reports severe headache on the top of her head. No neck pain or rigidity. It is associated with dizziness. Denies unilateral weakness, numbness or tingling of the extremities. No vision change or hearing loss. Patient does not have nausea, vomiting, diarrhea, abdominal pain, symptoms of UTI. Patient states that she drinks 2-3 beers every day. Patient states that she has a lot of stress at home due to recent husband suicide. Initially patient had elevated blood pressure 203/74, which improved to 169/82 currently.  ED Course: pt was found to have WBC 7.9, troponin negative, acute renal injury with creatinine 1.22, temperature normal, no tachycardia, tachypnea, ETC. 97% on room air, chest x-ray showed COPD without infiltration. CT angiogram of the head and the neck did not show large vessel occlusion or acute vascular abnormalities. Patient is placed on telemetry bed for observation.   Review of Systems:   General: no fevers, chills, no body weight gain, has poor appetite, has fatigue HEENT: no blurry vision, hearing  changes or sore throat. Has HA. Respiratory: has dyspnea, no coughing, wheezing CV: has chest pain, no palpitations GI: no nausea, vomiting, abdominal pain, diarrhea, constipation GU: no dysuria, burning on urination, increased urinary frequency, hematuria  Ext: no leg edema Neuro: no unilateral weakness, numbness, or tingling, no vision change or hearing loss. Has dizziness. Skin: no rash, no skin tear. MSK: No muscle spasm, no deformity, no limitation of range of movement in spin Heme: No easy bruising.  Travel history: No recent long distant travel.  Allergy:  Allergies  Allergen Reactions  . Latex Swelling and Other (See Comments)     fever blisters  . Doxycycline Other (See Comments)    Blurred vision, nervous, unsteady  . Lactose Intolerance (Gi)     Unknown, per pt  . Lialda [Mesalamine] Diarrhea  . Prinivil [Lisinopril] Diarrhea  . Triamcinolone Acetonide Other (See Comments)    REDNESS AND PAIN  . Amlodipine Rash  . Lotensin [Benazepril Hcl] Rash    Past Medical History:  Diagnosis Date  . AIN grade I   . Anxiety   . Bruises easily   . Chronic diarrhea   . Chronic hyponatremia   . Colitis   . COPD (chronic obstructive pulmonary disease) ([place])   . Depression   . GERD (gastroesophageal reflux disease)   . History of chronic bronchitis   . History of vulvar dysplasia    serveral recurrency's   . Hypertension   . LBBB (left bundle branch block) W/ PROLONGED PR   CARDIOLOGSIT-  DR Johnsie Cancel-   LOV IN EPIC  . Macrocytosis   . Nocturia   . Osteoporosis   .  Seronegative rheumatoid arthritis ([place])   . Short of breath on exertion   . Smokers' cough ([place])   . Venous insufficiency of leg    , Edema  . VIN III (vulvar intraepithelial neoplasia III)    RECURRENT -    Past Surgical History:  Procedure Laterality Date  . APPENDECTOMY  1992  . CARDIOVASCULAR STRESS TEST  06-23-2012  DR Jenkins [place]   NORMAL LEXISCAN MYOVIEW/ EF 83%/  NO ISCHEMIA  . CO2 LASER  APPLICATION  62/83/1517   Procedure: CO2 LASER APPLICATION;  Surgeon: Janie Morning, MD PHD;  Location: [facility];  Service: Gynecology;  Laterality: N/A;  Laser Vaporization  . CO2 LASER APPLICATION N/A 01/31/6072   Procedure: CO2 LASER APPLICATION;  Surgeon: [name], MD;  Location: [facility];  Service: Gynecology;  Laterality: N/A;  . CO2 LASER APPLICATION N/A 03/10/6268   Procedure: CO2 LASER APPLICATION of the vulva;  Surgeon: Janie Morning, MD;  Location: [facility];  Service: Gynecology;  Laterality: N/A;  . EXCISION VULVAR LESIONS  07/2012  . HEMORRHOID SURGERY N/A 04/15/2013   Procedure: Excision of external and internal  hemorrhoid with AIN, Exam under anesthesia;  Surgeon: Odis Hollingshead, MD;  Location: [facility];  Service: General;  Laterality: N/A;  . REPAIR FISTULA IN ANO/  I & D PERIRECTAL ABSCESS  10-26-2002  DR Johnathan Hausen  . TRANSTHORACIC ECHOCARDIOGRAM  06-23-2012   NORMAL LVSF/ EF 55-60%  . VAGINAL HYSTERECTOMY  1997   partial  . VULVAR LESION REMOVAL N/A 11/03/2014   Procedure: CO2 LASER OF VULVAR ;  Surgeon: [name], MD;  Location: [facility];  Service: Gynecology;  Laterality: N/A;  . VULVECTOMY N/A 03/26/2015   Procedure: WIDE LOCA  EXCISION OF VULVAR;  Surgeon: [name], MD;  Location: [facility];  Service: Gynecology;  Laterality: N/A;  . Wide Local Excision of labia majora  04-04-2010   Right-sided lesion, CO2 ablation of right labia minora    Social History:  reports that she has been smoking cigarettes.  She has a 12.50 pack-year smoking history. she has never used smokeless tobacco. She reports that she drinks about 1.2 oz of alcohol per week. She reports that she does not use drugs.  Family History:  Family History  Problem Relation Age of Onset  . Diabetes Mother   . Hypertension Father   . Lymphoma Son   . Cancer Son        lymphoblastic lymphoma     Prior to  Admission medications   Medication Sig Start Date End Date Taking? Authorizing Provider  atorvastatin (LIPITOR) 40 MG tablet TAKE ONE TABLET BY MOUTH ONCE DAILY 09/17/17  Yes Jerline Pain, MD  b complex vitamins capsule Take 1 capsule by mouth daily.   Yes [provider]  BIOTIN PO Take 5,000 mg by mouth 2 (two) times daily.   Yes [provider]  buPROPion (WELLBUTRIN SR) 150 MG 12 hr tablet Take 150 mg by mouth daily.    Yes [provider]  busPIRone (BUSPAR) 30 MG tablet Take 30 mg by mouth 2 (two) times daily.  11/20/14  Yes [provider]  Calcium Carbonate-Vitamin D (CALTRATE 600+D PO) Take 1 tablet by mouth daily.    Yes [provider]  Cholecalciferol (VITAMIN D3) 1000 UNITS CAPS Take 1,000 Units by mouth daily.    Yes [provider]  cycloSPORINE (RESTASIS) 0.05 % ophthalmic emulsion Place 1 drop into  both eyes 2 (two) times daily.   Yes [provider]  diltiazem (TIAZAC) 360 MG 24 hr capsule Take 360 mg by mouth daily.    Yes [provider]  FLUoxetine (PROZAC) 20 MG tablet Take 10-20 mg by mouth See admin instructions. Take 1/2 tablet daily for 7 days then increase to 1 tablet daily 09/18/17  Yes [provider]  Lysine 500 MG TABS Take 500 mg by mouth 2 (two) times daily.    Yes [provider]  omeprazole (PRILOSEC) 20 MG capsule Take 20 mg by mouth every other day.    Yes [provider]  Potassium (POTASSIMIN PO) Take 1 tablet by mouth every evening.    Yes [provider]  predniSONE (DELTASONE) 5 MG tablet Take 5 mg by mouth daily with breakfast. --  LONG-TERM USE FOR RA   Yes [provider]  PRESCRIPTION MEDICATION Take 1 tablet by mouth at bedtime. New Medication for Sleep   Yes [provider]  valsartan (DIOVAN) 320 MG tablet Take 320 mg by mouth every evening.    Yes [provider]  fluconazole (DIFLUCAN) 100 MG tablet Take 1 tablet  (100 mg total) by mouth as directed. Patient not taking: Reported on 09/24/2017 06/09/16   Joylene John D, NP    Physical Exam: Vitals:   09/24/17 0245 09/24/17 0300 09/24/17 0400 09/24/17 0415  BP: (!) 169/82 (!) 175/89 (!) 165/74 (!) 174/85  Pulse: 73 79 71 73  Resp: 15 19 14 14   Temp:      TempSrc:      SpO2: 97% 97% 97% 97%  Weight:      Height:       General: Not in acute distress HEENT:       Eyes: PERRL, EOMI, no scleral icterus.       ENT: No discharge from the ears and nose, no pharynx injection, no tonsillar enlargement.        Neck: No JVD, no bruit, no mass felt. Heme: No neck lymph node enlargement. Cardiac: S1/S2, RRR, No murmurs, No gallops or rubs. Respiratory: No rales, wheezing, rhonchi or rubs. Chest wall: pt has really produce for chest wall tenderness. GI: Soft, nondistended, nontender, no rebound pain, no organomegaly, BS present. GU: No hematuria Ext: No pitting leg edema bilaterally. 2+DP/PT pulse bilaterally. Musculoskeletal: No joint deformities, No joint redness or warmth, no limitation of ROM in spin. Skin: No rashes.  Neuro: Alert, oriented X3, cranial nerves II-XII grossly intact, moves all extremities normally.  Psych: Patient is not psychotic, no suicidal or hemocidal ideation.  Labs on Admission: I have personally reviewed following labs and imaging studies  CBC: Recent Labs  Lab 09/23/17 1856  WBC 7.9  HGB 12.0  HCT 33.2*  MCV 101.5*  PLT 676   Basic Metabolic Panel: Recent Labs  Lab 09/23/17 1856  NA 125*  K 3.8  CL 91*  CO2 20*  GLUCOSE 112*  BUN 9  CREATININE 1.22*  CALCIUM 9.7   GFR: Estimated Creatinine Clearance: 30.7 mL/min (A) (by C-G formula based on SCr of 1.22 mg/dL (H)). Liver Function Tests: No results for input(s): AST, ALT, ALKPHOS, BILITOT, PROT, ALBUMIN in the last 168 hours. No results for input(s): LIPASE, AMYLASE in the last 168 hours. No results for input(s): AMMONIA in the last 168  hours. Coagulation Profile: No results for input(s): INR, PROTIME in the last 168 hours. Cardiac Enzymes: Recent Labs  Lab 09/24/17 0343  TROPONINI <0.03   BNP (  last 3 results) No results for input(s): PROBNP in the last 8760 hours. HbA1C: No results for input(s): HGBA1C in the last 72 hours. CBG: No results for input(s): GLUCAP in the last 168 hours. Lipid Profile: Recent Labs    09/24/17 0343  CHOL 143  HDL 96  LDLCALC 42  TRIG 24  CHOLHDL 1.5   Thyroid Function Tests: No results for input(s): TSH, T4TOTAL, FREET4, T3FREE, THYROIDAB in the last 72 hours. Anemia Panel: No results for input(s): VITAMINB12, FOLATE, FERRITIN, TIBC, IRON, RETICCTPCT in the last 72 hours. Urine analysis:    Component Value Date/Time   COLORURINE YELLOW 09/22/2012 1100   APPEARANCEUR CLOUDY (A) 09/22/2012 1100   LABSPEC 1.009 09/22/2012 1100   PHURINE 6.5 09/22/2012 1100   GLUCOSEU NEGATIVE 09/22/2012 1100   HGBUR TRACE (A) 09/22/2012 1100   BILIRUBINUR NEGATIVE 09/22/2012 1100   KETONESUR NEGATIVE 09/22/2012 1100   PROTEINUR NEGATIVE 09/22/2012 1100   UROBILINOGEN 0.2 09/22/2012 1100   NITRITE POSITIVE (A) 09/22/2012 1100   LEUKOCYTESUR MODERATE (A) 09/22/2012 1100   Sepsis Labs: @LABRCNTIP (procalcitonin:4,lacticidven:4) )No results found for this or any previous visit (from the past 240 hour(s)).   Radiological Exams on Admission: Ct Angio Head W Or Wo Contrast  Result Date: 09/24/2017 CLINICAL DATA:  Initial evaluation for acute headache. EXAM: CT ANGIOGRAPHY HEAD AND NECK TECHNIQUE: Multidetector CT imaging of the head and neck was performed using the standard protocol during bolus administration of intravenous contrast. Multiplanar CT image reconstructions and MIPs were obtained to evaluate the vascular anatomy. Carotid stenosis measurements (when applicable) are obtained utilizing NASCET criteria, using the distal internal carotid diameter as the denominator. CONTRAST:  28mL  ISOVUE-370 IOPAMIDOL (ISOVUE-370) INJECTION 76% COMPARISON:  None. FINDINGS: CT HEAD FINDINGS Brain: Cerebral volume within normal limits for patient age. Mild chronic microvascular changes. No evidence for acute intracranial hemorrhage. No findings to suggest acute large vessel territory infarct. No mass lesion, midline shift, or mass effect. Ventricles are normal in size without evidence for hydrocephalus. 1.6 x 2.9 cm arachnoid cyst overlies the right frontal convexity (series 4, image 20). No other extra-axial fluid collection. Vascular: No hyperdense vessel identified. Calcified atherosclerosis present at the skull base. Skull: Scalp soft tissues demonstrate no acute abnormality. Calvarium intact. Sinuses/Orbits: Globes and orbital soft tissues within normal limits. Moderate layering opacity within left sphenoid sinus. Paranasal sinuses are otherwise clear. Trace bilateral mastoid effusions. CTA NECK FINDINGS Aortic arch: Moderate calcified plaque within the partially visualized aortic arch. Origin of the great vessels not included on this exam. Visualized subclavian arteries patent without stenosis. Right carotid system: Right common carotid artery widely patent to the bifurcation. A centric calcified plaque about the right bifurcation/proximal right ICA with associated stenosis of up to 50% by NASCET criteria. Right ICA tortuous but widely patent to the skull base without additional stenosis. Left carotid system: Visualized left common carotid artery patent to the bifurcation without flow-limiting stenosis. A centric calcified plaque about the left bifurcation without hemodynamically significant stenosis. Left ICA tortuous but widely patent to the skull base. Vertebral arteries: Both of the vertebral arteries arise from the subclavian arteries. Left vertebral artery slightly dominant. Vertebral arteries tortuous but widely patent to the skull base without stenosis, dissection or occlusion. Skeleton: No acute  osseus abnormality. No worrisome lytic or blastic osseous lesions. Other neck: No acute soft tissue abnormality within the neck. Parotid glands are atrophic bilaterally. Submandibular glands normal. Subcentimeter hypodense nodule noted within the right lobe of thyroid, of doubtful significance. Thyroid otherwise  unremarkable. No adenopathy. Upper chest: Visualized upper chest demonstrates no acute abnormality. Visualized upper lungs are clear. Centrilobular emphysema. Review of the MIP images confirms the above findings CTA HEAD FINDINGS Anterior circulation: Petrous segments patent bilaterally. Extensive calcified atherosclerosis throughout the cavernous and supraclinoid ICAs. There is a resultant severe high-grade stenosis at the anterior genu of the cavernous right ICA (series 11, image 116). Additional moderate multifocal narrowing throughout the remainder the cavernous/supraclinoid segments. ICA termini patent. Left A1 segment patent. Right A1 segment hypoplastic but faintly visible. Normal anterior communicating artery. Anterior cerebral arteries widely patent to their distal aspects without stenosis. M1 segments patent without stenosis. MCA branches well perfused and symmetric but demonstrate moderate small vessel atheromatous irregularity. Posterior circulation: Vertebral arteries widely patent to the vertebrobasilar junction. Posterior inferior cerebral arteries patent bilaterally. Basilar artery widely patent to its distal aspect. Superior cerebellar and posterior cerebral arteries widely patent bilaterally. Small bilateral posterior communicating arteries noted. Venous sinuses: Patent. Anatomic variants: None significant. No aneurysm or vascular abnormality. Delayed phase: No abnormal enhancement. Review of the MIP images confirms the above findings IMPRESSION: 1. Negative CTA for large vessel occlusion. No acute vascular abnormality identified. No aneurysm. No acute intracranial process. 2. Moderate to  advanced atheromatous plaque throughout the carotid siphons with resultant severe near occlusive cavernous right ICA stenosis. 3. Atheromatous stenosis at the right carotid bifurcation of approximately 50% by NASCET criteria. 4. Approximate 3 cm benign arachnoid cyst overlying the right frontal convexity. 5. Emphysema. Electronically Signed   By: Jeannine Boga M.D.   On: 09/24/2017 02:19   Dg Chest 2 View  Result Date: 09/23/2017 CLINICAL DATA:  Chest tightness and shortness of breath EXAM: CHEST  2 VIEW COMPARISON:  Chest radiograph 06/22/2012 FINDINGS: The lungs are hyperinflated. The heart size and mediastinal contours are within normal limits. There is calcific aortic atherosclerosis. Both lungs are clear. The visualized skeletal structures are unremarkable. IMPRESSION: Hyperinflated lungs, consistent with COPD. Unchanged calcific aortic atherosclerosis without focal airspace disease or pulmonary edema. Aortic Atherosclerosis (ICD10-I70.0) and Emphysema (ICD10-J43.9). Electronically Signed   By: Ulyses Jarred M.D.   On: 09/23/2017 20:12   Ct Angio Neck W And/or Wo Contrast  Result Date: 09/24/2017 CLINICAL DATA:  Initial evaluation for acute headache. EXAM: CT ANGIOGRAPHY HEAD AND NECK TECHNIQUE: Multidetector CT imaging of the head and neck was performed using the standard protocol during bolus administration of intravenous contrast. Multiplanar CT image reconstructions and MIPs were obtained to evaluate the vascular anatomy. Carotid stenosis measurements (when applicable) are obtained utilizing NASCET criteria, using the distal internal carotid diameter as the denominator. CONTRAST:  12mL ISOVUE-370 IOPAMIDOL (ISOVUE-370) INJECTION 76% COMPARISON:  None. FINDINGS: CT HEAD FINDINGS Brain: Cerebral volume within normal limits for patient age. Mild chronic microvascular changes. No evidence for acute intracranial hemorrhage. No findings to suggest acute large vessel territory infarct. No mass  lesion, midline shift, or mass effect. Ventricles are normal in size without evidence for hydrocephalus. 1.6 x 2.9 cm arachnoid cyst overlies the right frontal convexity (series 4, image 20). No other extra-axial fluid collection. Vascular: No hyperdense vessel identified. Calcified atherosclerosis present at the skull base. Skull: Scalp soft tissues demonstrate no acute abnormality. Calvarium intact. Sinuses/Orbits: Globes and orbital soft tissues within normal limits. Moderate layering opacity within left sphenoid sinus. Paranasal sinuses are otherwise clear. Trace bilateral mastoid effusions. CTA NECK FINDINGS Aortic arch: Moderate calcified plaque within the partially visualized aortic arch. Origin of the great vessels not included on this exam. Visualized subclavian arteries  patent without stenosis. Right carotid system: Right common carotid artery widely patent to the bifurcation. A centric calcified plaque about the right bifurcation/proximal right ICA with associated stenosis of up to 50% by NASCET criteria. Right ICA tortuous but widely patent to the skull base without additional stenosis. Left carotid system: Visualized left common carotid artery patent to the bifurcation without flow-limiting stenosis. A centric calcified plaque about the left bifurcation without hemodynamically significant stenosis. Left ICA tortuous but widely patent to the skull base. Vertebral arteries: Both of the vertebral arteries arise from the subclavian arteries. Left vertebral artery slightly dominant. Vertebral arteries tortuous but widely patent to the skull base without stenosis, dissection or occlusion. Skeleton: No acute osseus abnormality. No worrisome lytic or blastic osseous lesions. Other neck: No acute soft tissue abnormality within the neck. Parotid glands are atrophic bilaterally. Submandibular glands normal. Subcentimeter hypodense nodule noted within the right lobe of thyroid, of doubtful significance. Thyroid  otherwise unremarkable. No adenopathy. Upper chest: Visualized upper chest demonstrates no acute abnormality. Visualized upper lungs are clear. Centrilobular emphysema. Review of the MIP images confirms the above findings CTA HEAD FINDINGS Anterior circulation: Petrous segments patent bilaterally. Extensive calcified atherosclerosis throughout the cavernous and supraclinoid ICAs. There is a resultant severe high-grade stenosis at the anterior genu of the cavernous right ICA (series 11, image 116). Additional moderate multifocal narrowing throughout the remainder the cavernous/supraclinoid segments. ICA termini patent. Left A1 segment patent. Right A1 segment hypoplastic but faintly visible. Normal anterior communicating artery. Anterior cerebral arteries widely patent to their distal aspects without stenosis. M1 segments patent without stenosis. MCA branches well perfused and symmetric but demonstrate moderate small vessel atheromatous irregularity. Posterior circulation: Vertebral arteries widely patent to the vertebrobasilar junction. Posterior inferior cerebral arteries patent bilaterally. Basilar artery widely patent to its distal aspect. Superior cerebellar and posterior cerebral arteries widely patent bilaterally. Small bilateral posterior communicating arteries noted. Venous sinuses: Patent. Anatomic variants: None significant. No aneurysm or vascular abnormality. Delayed phase: No abnormal enhancement. Review of the MIP images confirms the above findings IMPRESSION: 1. Negative CTA for large vessel occlusion. No acute vascular abnormality identified. No aneurysm. No acute intracranial process. 2. Moderate to advanced atheromatous plaque throughout the carotid siphons with resultant severe near occlusive cavernous right ICA stenosis. 3. Atheromatous stenosis at the right carotid bifurcation of approximately 50% by NASCET criteria. 4. Approximate 3 cm benign arachnoid cyst overlying the right frontal  convexity. 5. Emphysema. Electronically Signed   By: Jeannine Boga M.D.   On: 09/24/2017 02:19     EKG: Independently reviewed.     Assessment/Plan Principal Problem:   Hypertensive urgency Active Problems:   Chest pain   Hyponatremia   Hypertension   LBBB (left bundle branch block)   Anxiety   Depression   RA (rheumatoid arthritis) (H[facility])   Tobacco abuse   Headache   Alcohol use   AKI (acute kidney injury) ([place])  Hypertensive urgency:  Initially patient had elevated blood pressure 203/74, which improved to 169/82 after treated with one dose of losartan 50 mg in ED.   -will place on tele bed for obs - continue home cardizem 360 mg daily -hold Diovan due AKI -start oral hydralazine 25 mg 3 times a day -IV hydralazine when necessary   Chest pain: Patient has atypical chest pain also has reproducible chest wall tenderness, indicating muscular skeletal pain. Patient may also have demand ischemia secondary to hypertensive urgency. - cycle CE q6 x3 and repeat EKG in the am  -  prn Nitroglycerin, Morphine, and aspirin, lipitor  - Risk factor stratification: will check FLP, UDS and A1C  - 2d echo  Hyponatremia: this is a chronic issue. Baseline sodium is 127-134. Today her sodium is 125 which is slightly lower than baseline. Likely due to potomania secondary to alcohol use. -IVF: received 1L NS, then 75 cc/h -check TSH -Diovan is held.  Depression and anxiety: Stable, no suicidal or homicidal ideations. -Continue home medications: BuSpar and Prozac  RA (rheumatoid arthritis) ([place]): -continue prednisone 5 mg daily -Check cortisol level  Tobacco abuse and Alcohol abuse: -Did counseling about importance of quitting smoking -Nicotine patch -Did counseling about the importance of quitting drinking -CIWA protocol  Headache: Likely due to hypertensive urgency. CT angiogram of the head and the neck is negative for acute large vessel occlusion. -When necessary Tylenol or  Excidrin  AKI: Likely due to prerenal secondary to dehydration and continuation of ARB - IVF as above - Check  FeUrea - Follow up renal function by BMP - Hold Diovan  DVT ppx: SQ Lovenox Code Status: Full code Family Communication: None at bed side. Disposition Plan:  Anticipate discharge back to previous home environment Consults called:  none Admission status: Obs / tele      Date of Service 09/24/2017    Ivor Costa [facility] Pager [phone]  If 7PM-7AM, please contact night-coverage [email or web address] Password [facility] 09/24/2017, 4:57 AM

## 2017-09-24 NOTE — Plan of Care (Signed)
Alert and oriented. Verbalizes understanding of POC, ie. Cycle troponins, Stress Test. Spiritual Care consulted.

## 2017-09-25 ENCOUNTER — Observation Stay (HOSPITAL_COMMUNITY): Payer: PPO

## 2017-09-25 DIAGNOSIS — F329 Major depressive disorder, single episode, unspecified: Secondary | ICD-10-CM

## 2017-09-25 DIAGNOSIS — Z720 Tobacco use: Secondary | ICD-10-CM | POA: Diagnosis not present

## 2017-09-25 DIAGNOSIS — M810 Age-related osteoporosis without current pathological fracture: Secondary | ICD-10-CM | POA: Diagnosis present

## 2017-09-25 DIAGNOSIS — I447 Left bundle-branch block, unspecified: Secondary | ICD-10-CM | POA: Diagnosis present

## 2017-09-25 DIAGNOSIS — F17210 Nicotine dependence, cigarettes, uncomplicated: Secondary | ICD-10-CM | POA: Diagnosis present

## 2017-09-25 DIAGNOSIS — I160 Hypertensive urgency: Secondary | ICD-10-CM | POA: Diagnosis present

## 2017-09-25 DIAGNOSIS — Z7289 Other problems related to lifestyle: Secondary | ICD-10-CM | POA: Diagnosis not present

## 2017-09-25 DIAGNOSIS — Z789 Other specified health status: Secondary | ICD-10-CM | POA: Diagnosis not present

## 2017-09-25 DIAGNOSIS — M069 Rheumatoid arthritis, unspecified: Secondary | ICD-10-CM

## 2017-09-25 DIAGNOSIS — E876 Hypokalemia: Secondary | ICD-10-CM | POA: Diagnosis present

## 2017-09-25 DIAGNOSIS — F1010 Alcohol abuse, uncomplicated: Secondary | ICD-10-CM | POA: Diagnosis present

## 2017-09-25 DIAGNOSIS — R531 Weakness: Secondary | ICD-10-CM | POA: Diagnosis present

## 2017-09-25 DIAGNOSIS — I161 Hypertensive emergency: Secondary | ICD-10-CM | POA: Diagnosis not present

## 2017-09-25 DIAGNOSIS — F419 Anxiety disorder, unspecified: Secondary | ICD-10-CM | POA: Diagnosis present

## 2017-09-25 DIAGNOSIS — K219 Gastro-esophageal reflux disease without esophagitis: Secondary | ICD-10-CM | POA: Diagnosis present

## 2017-09-25 DIAGNOSIS — J449 Chronic obstructive pulmonary disease, unspecified: Secondary | ICD-10-CM | POA: Diagnosis present

## 2017-09-25 DIAGNOSIS — R42 Dizziness and giddiness: Secondary | ICD-10-CM | POA: Diagnosis not present

## 2017-09-25 DIAGNOSIS — N179 Acute kidney failure, unspecified: Secondary | ICD-10-CM | POA: Diagnosis present

## 2017-09-25 DIAGNOSIS — R0789 Other chest pain: Secondary | ICD-10-CM | POA: Diagnosis present

## 2017-09-25 DIAGNOSIS — I872 Venous insufficiency (chronic) (peripheral): Secondary | ICD-10-CM | POA: Diagnosis present

## 2017-09-25 DIAGNOSIS — I10 Essential (primary) hypertension: Secondary | ICD-10-CM

## 2017-09-25 DIAGNOSIS — I700 Atherosclerosis of aorta: Secondary | ICD-10-CM | POA: Diagnosis present

## 2017-09-25 DIAGNOSIS — E739 Lactose intolerance, unspecified: Secondary | ICD-10-CM | POA: Diagnosis present

## 2017-09-25 DIAGNOSIS — I6523 Occlusion and stenosis of bilateral carotid arteries: Secondary | ICD-10-CM | POA: Diagnosis not present

## 2017-09-25 DIAGNOSIS — E860 Dehydration: Secondary | ICD-10-CM | POA: Diagnosis present

## 2017-09-25 DIAGNOSIS — R079 Chest pain, unspecified: Secondary | ICD-10-CM | POA: Diagnosis present

## 2017-09-25 DIAGNOSIS — R072 Precordial pain: Secondary | ICD-10-CM | POA: Diagnosis present

## 2017-09-25 DIAGNOSIS — I6521 Occlusion and stenosis of right carotid artery: Secondary | ICD-10-CM | POA: Diagnosis present

## 2017-09-25 DIAGNOSIS — Z888 Allergy status to other drugs, medicaments and biological substances status: Secondary | ICD-10-CM | POA: Diagnosis not present

## 2017-09-25 DIAGNOSIS — E871 Hypo-osmolality and hyponatremia: Secondary | ICD-10-CM | POA: Diagnosis present

## 2017-09-25 LAB — UREA NITROGEN, URINE: Urea Nitrogen, Ur: 96 mg/dL

## 2017-09-25 MED ORDER — SODIUM CHLORIDE 0.9 % IV SOLN: INTRAVENOUS | Status: DC

## 2017-09-25 MED ORDER — CAMPHOR-MENTHOL 0.5-0.5 % EX LOTN
TOPICAL_LOTION | CUTANEOUS | Status: DC | PRN
  Administered 2017-09-25: 21:00:00 via TOPICAL
  Filled 2017-09-25: qty 222

## 2017-09-25 MED ORDER — HYDRALAZINE HCL 50 MG PO TABS
50.0000 mg | ORAL_TABLET | Freq: Three times a day (TID) | ORAL | Status: DC
  Administered 2017-09-25 – 2017-09-27 (×7): 50 mg via ORAL
  Filled 2017-09-25 (×7): qty 1

## 2017-09-25 MED ORDER — BOOST / RESOURCE BREEZE PO LIQD CUSTOM
1.0000 | Freq: Three times a day (TID) | ORAL | Status: DC
  Administered 2017-09-25 – 2017-09-27 (×5): 1 via ORAL

## 2017-09-25 MED ORDER — POTASSIUM CHLORIDE CRYS ER 20 MEQ PO TBCR
40.0000 meq | EXTENDED_RELEASE_TABLET | ORAL | Status: AC
  Administered 2017-09-25 (×2): 40 meq via ORAL
  Filled 2017-09-25 (×2): qty 2

## 2017-09-25 NOTE — Progress Notes (Signed)
Pt spilt hot coffee this am on her abdomen. SKin intact but red. Pt complains of burning on her belly. I notified Dr. Ree Kida and orders for [facility]. Will apply PRN. PT satisfied with this treatment.

## 2017-09-25 NOTE — Evaluation (Signed)
Physical Therapy Evaluation Patient Details Name: Regina Ortiz MRN: [id number] DOB: Nov 02, 1945 Today's Date: 09/25/2017   History of Present Illness   Regina Ortiz is a 72 y.o. female with medical history significant for hypertension, hyperlipidemia, COPD, GERD, depression, anxiety, rheumatoid arthritis, left bundle blockage, chronic hyponatremia, tobacco abuse, alcohol use, who presents with chest pain and headache.  Clinical Impression  Pt is close to baseline functioning and should be safe at home with extra family assist for a few days. There are no further acute PT needs.  Will sign off at this time.     Follow Up Recommendations Home health PT    Equipment Recommendations  None recommended by PT    Recommendations for Other Services       Precautions / Restrictions Precautions Precautions: Fall      Mobility  Bed Mobility Overal bed mobility: Independent                Transfers Overall transfer level: Independent                  Ambulation/Gait Ambulation/Gait assistance: Supervision Ambulation Distance (Feet): 100 Feet Assistive device: (rail vs none) Gait Pattern/deviations: Step-through pattern Gait velocity: slower Gait velocity interpretation: Below normal speed for age/gender General Gait Details: pt mildly unsteady and guarded from "doing nothing since Tuesday"  Preferred to use the rail, but could ambulate without AD.  Stairs Stairs: (pt deferred steps)          Wheelchair Mobility    Modified Rankin (Stroke Patients Only)       Balance Overall balance assessment: Needs assistance Sitting-balance support: No upper extremity supported Sitting balance-Leahy Scale: Good     Standing balance support: Single extremity supported;No upper extremity supported Standing balance-Leahy Scale: Fair                               Pertinent Vitals/Pain      Home Living Family/patient expects to be discharged to::  Private residence Living Arrangements: Alone Available Help at Discharge: Family;Available PRN/intermittently;Friend(s) Type of Home: House Home Access: Stairs to enter Entrance Stairs-Rails: Psychiatric nurse of Steps: several Home Layout: Two level;Bed/bath upstairs Home Equipment: Cane - single point      Prior Function Level of Independence: Independent         Comments: Does all ADL's, iADL's, walks without AD and drives.     Hand Dominance        Extremity/Trunk Assessment   Upper Extremity Assessment Upper Extremity Assessment: Overall WFL for tasks assessed    Lower Extremity Assessment Lower Extremity Assessment: Overall WFL for tasks assessed       Communication   Communication: No difficulties  Cognition Arousal/Alertness: Awake/alert Behavior During Therapy: WFL for tasks assessed/performed Overall Cognitive Status: Within Functional Limits for tasks assessed                                        General Comments      Exercises     Assessment/Plan    PT Assessment All further PT needs can be met in the next venue of care  PT Problem List Decreased strength;Decreased activity tolerance;Decreased balance       PT Treatment Interventions      PT Goals (Current goals can be found in the Care Plan section)  Acute  Rehab PT Goals Patient Stated Goal: get back home PT Goal Formulation: All assessment and education complete, DC therapy    Frequency     Barriers to discharge        Co-evaluation               AM-PAC PT "6 Clicks" Daily Activity  Outcome Measure Difficulty turning over in bed (including adjusting bedclothes, sheets and blankets)?: None Difficulty moving from lying on back to sitting on the side of the bed? : None Difficulty sitting down on and standing up from a chair with arms (e.g., wheelchair, bedside commode, etc,.)?: None Help needed moving to and from a bed to chair (including a  wheelchair)?: A Little Help needed walking in hospital room?: A Little Help needed climbing 3-5 steps with a railing? : A Little 6 Click Score: 21    End of Session   Activity Tolerance: Patient tolerated treatment well Patient left: in bed;with call bell/phone within reach;with family/visitor present Nurse Communication: Mobility status PT Visit Diagnosis: Unsteadiness on feet (R26.81)    Time: 9357-0177 PT Time Calculation (min) (ACUTE ONLY): 18 min   Charges:   PT Evaluation $PT Eval Moderate Complexity: 1 Mod     PT G Codes:        10-07-2017  Donnella Sham, PT [phone]  (pager)  Tessie Fass Xylia Scherger 10/07/17, 4:28 PM

## 2017-09-25 NOTE — Progress Notes (Signed)
Initial Nutrition Assessment  DOCUMENTATION CODES:   Non-severe (moderate) malnutrition in context of social or environmental circumstances, Underweight  INTERVENTION:    Boost Breeze po TID, each supplement provides 250 kcal and 9 grams of protein  NUTRITION DIAGNOSIS:   Moderate Malnutrition related to social / environmental circumstances(recent stress related to death of her husband) as evidenced by mild muscle depletion, percent weight loss(8% weight loss within 3 months).  GOAL:   Patient will meet greater than or equal to 90% of their needs  MONITOR:   PO intake, Supplement acceptance  REASON FOR ASSESSMENT:   Malnutrition Screening Tool    ASSESSMENT:   72 yo female with PMH of HTN, osteoporosis, nocturia, depression, anxiety, chronic hyponatremia, rheumatoid arthritis, LBBB, venous insufficiency of leg, COPD, colitis, chronic diarrhea, GERD, alcohol & tobacco use who was admitted on 1/23 with chest pain and headache. Nuclear stress test on 1/24 was unremarkable.   Patient reports ongoing poor appetite and intake for the past 2 months. She can tell she has lost muscle mass. Usual weight 109 lbs 2 months ago. She thinks that Ensure will cause her diarrhea, but agreed to try Boost Breeze between meals.   8% weight loss is significant for 3 month time frame.  Labs reviewed. Sodium 126 (L), potassium 3 (L) Medications reviewed and include vitamin B/C, oscal with vitamin D, folic acid, KCl, thiamine, prednisone.  NUTRITION - FOCUSED PHYSICAL EXAM:    Most Recent Value  Orbital Region  No depletion  Upper Arm Region  No depletion  Thoracic and Lumbar Region  No depletion  Buccal Region  No depletion  Temple Region  No depletion  Clavicle Bone Region  Mild depletion  Clavicle and Acromion Bone Region  No depletion  Scapular Bone Region  No depletion  Dorsal Hand  Moderate depletion  Patellar Region  Moderate depletion  Anterior Thigh Region  Moderate depletion   Posterior Calf Region  Mild depletion  Edema (RD Assessment)  None  Hair  Reviewed  Eyes  Reviewed  Mouth  Reviewed  Skin  Reviewed  Nails  Reviewed       Diet Order:  Diet Heart Room service appropriate? Yes; Fluid consistency: Thin  EDUCATION NEEDS:   Education needs have been addressed  Skin:  Skin Assessment: Reviewed RN Assessment  Last BM:  1/23  Height:   Ht Readings from Last 1 Encounters:  09/24/17 5\' 4"  (1.626 m)    Weight:   Wt Readings from Last 1 Encounters:  09/24/17 100 lb 3.2 oz (45.5 kg)    Ideal Body Weight:  54.5 kg  BMI:  Body mass index is 17.2 kg/m.  Estimated Nutritional Needs:   Kcal:  1300-1500  Protein:  65-75 gm  Fluid:  1.5 L   Molli Barrows, RD, LDN, CNSC Pager [phone] After Hours Pager [phone]

## 2017-09-25 NOTE — Progress Notes (Signed)
*  PRELIMINARY RESULTS* Vascular Ultrasound Carotid Duplex (Doppler) has been completed.  Preliminary findings:  Right Carotid: Velocities in the right ICA are consistent with a 1-39% stenosis.    Left Carotid: Velocities in the left ICA are consistent with a 40-59% stenosis. Left mid ICA, 40-59% stenosis could be secondary to tortuous vessel.    Vertebrals:Both vertebral arteries were patent with antegrade flow.    Everrett Coombe 09/25/2017, 5:32 PM

## 2017-09-25 NOTE — Progress Notes (Signed)
PROGRESS NOTE    Regina Ortiz  JXB:[id number] DOB: 12/01/1945 DOA: 09/23/2017 PCP: Maurice Small, MD   Chief Complaint  Patient presents with  . Chest Pain    Brief Narrative:  HPI On 09/24/2017 by Dr. Janetta Hora is a 72 y.o. female with medical history significant of hypertension, hyperlipidemia, COPD, GERD, depression, anxiety, rheumatoid arthritis, left bundle blockage, chronic hyponatremia, tobacco abuse, alcohol use, who presents with chest pain and headache.  Patient states that she has been having chest pain in the past 2 days. The chest pain is located in the substernal area, sharp and also pressure-like pain, constant, radiating to left arm and jaw. She also has frontal chest wall tenderness which is aggravated by deep breath. It is associated with mild SOB and lightheadedness. Patient also reports severe headache on the top of her head. No neck pain or rigidity. It is associated with dizziness. Denies unilateral weakness, numbness or tingling of the extremities. No vision change or hearing loss. Patient does not have nausea, vomiting, diarrhea, abdominal pain, symptoms of UTI. Patient states that she drinks 2-3 beers every day. Patient states that she has a lot of stress at home due to recent husband suicide. Initially patient had elevated blood pressure 203/74, which improved to 169/82 currently.  Assessment & Plan   Chest pain -Troponins cycled and unremarkable -Cardiology consultation appreciated  -Nuclear stress test 09/24/2017 unremarkable -Echocardiogram showed EF of 13-08%, grade 1 diastolic dysfunction -Suspect chest pain is likely secondary to stress since patient's husband committed suicide on 08/01/2017 -Per cardiology to further workup -continue aspirin  Severe right carotid disease -Patient with bilateral carotid bruits, left greater than right -Carotid ultrasound in 2017 showed 1-39% R ICA stenosis, 65-78% LICA stenosis -CTA: Moderate to advanced  atheromatous plaque throughout the carotid siphons with resultant severe near occlusive cavernous right ICA stenosis.  -Carotid doppler pending -vascular surgery consultation appreciated, recommended outpatient follow-up  Hypertensive urgency -BP improved, continue diltiazem, hydralazine  Acute kidney injury -Results, continue to monitor BMP  Hyponatremia, chronic -Sodium 126, has been low since 2016 -And monitor BMP  Hypokalemia -Replacing continue to monitor BMP  Depression/anxiety -Continue BuSpar, Prozac  Rheumatoid arthritis -Continue home dose of prednisone  Tobacco abuse/alcohol abuse -Discuss cessation, continue nicotine patch  Deconditioning and weakness -PT consulted  DVT Prophylaxis  lovenox  Code Status: Full  Family Communication: None at bedside  Disposition Plan: observation, plan for discharge on 1/26  Consultants Cardiology  Procedures  Stress-lexiscan  Echocardiogram  Antibiotics   Anti-infectives (From admission, onward)   None      Subjective:   Regina Ortiz seen and examined today.  Denies further chest pain. The area and tearful and stressed out what life. Denies shortness of breath, bone pain, nausea vomiting, diarrhea or constipation.  Objective:   Vitals:   09/24/17 1634 09/24/17 2017 09/25/17 0500 09/25/17 1315  BP: (!) 149/64 (!) 160/71 (!) 173/64 (!) 166/68  Pulse: 87 74 79 81  Resp:  16 16   Temp: 97.6 F (36.4 C) 98.4 F (36.9 C) 100.2 F (37.9 C) 98 F (36.7 C)  TempSrc: Oral Oral Oral Oral  SpO2: 98% 97% 97% 98%  Weight:      Height:        Intake/Output Summary (Last 24 hours) at 09/25/2017 1441 Last data filed at 09/25/2017 0506 Gross per 24 hour  Intake 2051.25 ml  Output 400 ml  Net 1651.25 ml   Filed Weights   09/24/17 0137 09/24/17  0459  Weight: 46.7 kg (103 lb) 45.5 kg (100 lb 3.2 oz)    Exam  General: Well developed, thin, elderly  HEENT: NCAT, mucous membranes moist.   Cardiovascular: S1 S2  auscultated, no rubs, murmurs or gallops. Regular rate and rhythm.  Respiratory: Clear to auscultation bilaterally with equal chest rise  Abdomen: Soft, nontender, nondistended, + bowel sounds  Extremities: warm dry without cyanosis clubbing or edema  Neuro: AAOx3, nonfocal  Psych: Depressed   Data Reviewed: I have personally reviewed following labs and imaging studies  CBC: Recent Labs  Lab 09/23/17 1856 09/24/17 1507  WBC 7.9 8.0  HGB 12.0 10.9*  HCT 33.2* 30.5*  MCV 101.5* 102.0*  PLT 195 419   Basic Metabolic Panel: Recent Labs  Lab 09/23/17 1856 09/24/17 1507  NA 125* 126*  K 3.8 3.0*  CL 91* 94*  CO2 20* 22  GLUCOSE 112* 102*  BUN 9 7  CREATININE 1.22* 0.95  CALCIUM 9.7 8.3*   GFR: Estimated Creatinine Clearance: 38.4 mL/min (by C-G formula based on SCr of 0.95 mg/dL). Liver Function Tests: No results for input(s): AST, ALT, ALKPHOS, BILITOT, PROT, ALBUMIN in the last 168 hours. No results for input(s): LIPASE, AMYLASE in the last 168 hours. No results for input(s): AMMONIA in the last 168 hours. Coagulation Profile: No results for input(s): INR, PROTIME in the last 168 hours. Cardiac Enzymes: Recent Labs  Lab 09/24/17 0343 09/24/17 0857 09/24/17 1507  TROPONINI <0.03 <0.03 <0.03   BNP (last 3 results) No results for input(s): PROBNP in the last 8760 hours. HbA1C: Recent Labs    09/24/17 0343  HGBA1C 5.4   CBG: No results for input(s): GLUCAP in the last 168 hours. Lipid Profile: Recent Labs    09/24/17 0343  CHOL 143  HDL 96  LDLCALC 42  TRIG 24  CHOLHDL 1.5   Thyroid Function Tests: Recent Labs    09/24/17 0343  TSH 3.597   Anemia Panel: No results for input(s): VITAMINB12, FOLATE, FERRITIN, TIBC, IRON, RETICCTPCT in the last 72 hours. Urine analysis:    Component Value Date/Time   COLORURINE YELLOW 09/22/2012 1100   APPEARANCEUR CLOUDY (A) 09/22/2012 1100   LABSPEC 1.009 09/22/2012 1100   PHURINE 6.5 09/22/2012 1100    GLUCOSEU NEGATIVE 09/22/2012 1100   HGBUR TRACE (A) 09/22/2012 1100   BILIRUBINUR NEGATIVE 09/22/2012 1100   KETONESUR NEGATIVE 09/22/2012 1100   PROTEINUR NEGATIVE 09/22/2012 1100   UROBILINOGEN 0.2 09/22/2012 1100   NITRITE POSITIVE (A) 09/22/2012 1100   LEUKOCYTESUR MODERATE (A) 09/22/2012 1100   Sepsis Labs: @LABRCNTIP (procalcitonin:4,lacticidven:4)  )No results found for this or any previous visit (from the past 240 hour(s)).    Radiology Studies: Ct Angio Head W Or Wo Contrast  Result Date: 09/24/2017 CLINICAL DATA:  Initial evaluation for acute headache. EXAM: CT ANGIOGRAPHY HEAD AND NECK TECHNIQUE: Multidetector CT imaging of the head and neck was performed using the standard protocol during bolus administration of intravenous contrast. Multiplanar CT image reconstructions and MIPs were obtained to evaluate the vascular anatomy. Carotid stenosis measurements (when applicable) are obtained utilizing NASCET criteria, using the distal internal carotid diameter as the denominator. CONTRAST:  14mL ISOVUE-370 IOPAMIDOL (ISOVUE-370) INJECTION 76% COMPARISON:  None. FINDINGS: CT HEAD FINDINGS Brain: Cerebral volume within normal limits for patient age. Mild chronic microvascular changes. No evidence for acute intracranial hemorrhage. No findings to suggest acute large vessel territory infarct. No mass lesion, midline shift, or mass effect. Ventricles are normal in size without evidence for hydrocephalus. 1.6  x 2.9 cm arachnoid cyst overlies the right frontal convexity (series 4, image 20). No other extra-axial fluid collection. Vascular: No hyperdense vessel identified. Calcified atherosclerosis present at the skull base. Skull: Scalp soft tissues demonstrate no acute abnormality. Calvarium intact. Sinuses/Orbits: Globes and orbital soft tissues within normal limits. Moderate layering opacity within left sphenoid sinus. Paranasal sinuses are otherwise clear. Trace bilateral mastoid effusions. CTA  NECK FINDINGS Aortic arch: Moderate calcified plaque within the partially visualized aortic arch. Origin of the great vessels not included on this exam. Visualized subclavian arteries patent without stenosis. Right carotid system: Right common carotid artery widely patent to the bifurcation. A centric calcified plaque about the right bifurcation/proximal right ICA with associated stenosis of up to 50% by NASCET criteria. Right ICA tortuous but widely patent to the skull base without additional stenosis. Left carotid system: Visualized left common carotid artery patent to the bifurcation without flow-limiting stenosis. A centric calcified plaque about the left bifurcation without hemodynamically significant stenosis. Left ICA tortuous but widely patent to the skull base. Vertebral arteries: Both of the vertebral arteries arise from the subclavian arteries. Left vertebral artery slightly dominant. Vertebral arteries tortuous but widely patent to the skull base without stenosis, dissection or occlusion. Skeleton: No acute osseus abnormality. No worrisome lytic or blastic osseous lesions. Other neck: No acute soft tissue abnormality within the neck. Parotid glands are atrophic bilaterally. Submandibular glands normal. Subcentimeter hypodense nodule noted within the right lobe of thyroid, of doubtful significance. Thyroid otherwise unremarkable. No adenopathy. Upper chest: Visualized upper chest demonstrates no acute abnormality. Visualized upper lungs are clear. Centrilobular emphysema. Review of the MIP images confirms the above findings CTA HEAD FINDINGS Anterior circulation: Petrous segments patent bilaterally. Extensive calcified atherosclerosis throughout the cavernous and supraclinoid ICAs. There is a resultant severe high-grade stenosis at the anterior genu of the cavernous right ICA (series 11, image 116). Additional moderate multifocal narrowing throughout the remainder the cavernous/supraclinoid segments. ICA  termini patent. Left A1 segment patent. Right A1 segment hypoplastic but faintly visible. Normal anterior communicating artery. Anterior cerebral arteries widely patent to their distal aspects without stenosis. M1 segments patent without stenosis. MCA branches well perfused and symmetric but demonstrate moderate small vessel atheromatous irregularity. Posterior circulation: Vertebral arteries widely patent to the vertebrobasilar junction. Posterior inferior cerebral arteries patent bilaterally. Basilar artery widely patent to its distal aspect. Superior cerebellar and posterior cerebral arteries widely patent bilaterally. Small bilateral posterior communicating arteries noted. Venous sinuses: Patent. Anatomic variants: None significant. No aneurysm or vascular abnormality. Delayed phase: No abnormal enhancement. Review of the MIP images confirms the above findings IMPRESSION: 1. Negative CTA for large vessel occlusion. No acute vascular abnormality identified. No aneurysm. No acute intracranial process. 2. Moderate to advanced atheromatous plaque throughout the carotid siphons with resultant severe near occlusive cavernous right ICA stenosis. 3. Atheromatous stenosis at the right carotid bifurcation of approximately 50% by NASCET criteria. 4. Approximate 3 cm benign arachnoid cyst overlying the right frontal convexity. 5. Emphysema. Electronically Signed   By: Jeannine Boga M.D.   On: 09/24/2017 02:19   Dg Chest 2 View  Result Date: 09/23/2017 CLINICAL DATA:  Chest tightness and shortness of breath EXAM: CHEST  2 VIEW COMPARISON:  Chest radiograph 06/22/2012 FINDINGS: The lungs are hyperinflated. The heart size and mediastinal contours are within normal limits. There is calcific aortic atherosclerosis. Both lungs are clear. The visualized skeletal structures are unremarkable. IMPRESSION: Hyperinflated lungs, consistent with COPD. Unchanged calcific aortic atherosclerosis without focal airspace disease or  pulmonary edema. Aortic Atherosclerosis (ICD10-I70.0) and Emphysema (ICD10-J43.9). Electronically Signed   By: Ulyses Jarred M.D.   On: 09/23/2017 20:12   Ct Angio Neck W And/or Wo Contrast  Result Date: 09/24/2017 CLINICAL DATA:  Initial evaluation for acute headache. EXAM: CT ANGIOGRAPHY HEAD AND NECK TECHNIQUE: Multidetector CT imaging of the head and neck was performed using the standard protocol during bolus administration of intravenous contrast. Multiplanar CT image reconstructions and MIPs were obtained to evaluate the vascular anatomy. Carotid stenosis measurements (when applicable) are obtained utilizing NASCET criteria, using the distal internal carotid diameter as the denominator. CONTRAST:  38mL ISOVUE-370 IOPAMIDOL (ISOVUE-370) INJECTION 76% COMPARISON:  None. FINDINGS: CT HEAD FINDINGS Brain: Cerebral volume within normal limits for patient age. Mild chronic microvascular changes. No evidence for acute intracranial hemorrhage. No findings to suggest acute large vessel territory infarct. No mass lesion, midline shift, or mass effect. Ventricles are normal in size without evidence for hydrocephalus. 1.6 x 2.9 cm arachnoid cyst overlies the right frontal convexity (series 4, image 20). No other extra-axial fluid collection. Vascular: No hyperdense vessel identified. Calcified atherosclerosis present at the skull base. Skull: Scalp soft tissues demonstrate no acute abnormality. Calvarium intact. Sinuses/Orbits: Globes and orbital soft tissues within normal limits. Moderate layering opacity within left sphenoid sinus. Paranasal sinuses are otherwise clear. Trace bilateral mastoid effusions. CTA NECK FINDINGS Aortic arch: Moderate calcified plaque within the partially visualized aortic arch. Origin of the great vessels not included on this exam. Visualized subclavian arteries patent without stenosis. Right carotid system: Right common carotid artery widely patent to the bifurcation. A centric calcified  plaque about the right bifurcation/proximal right ICA with associated stenosis of up to 50% by NASCET criteria. Right ICA tortuous but widely patent to the skull base without additional stenosis. Left carotid system: Visualized left common carotid artery patent to the bifurcation without flow-limiting stenosis. A centric calcified plaque about the left bifurcation without hemodynamically significant stenosis. Left ICA tortuous but widely patent to the skull base. Vertebral arteries: Both of the vertebral arteries arise from the subclavian arteries. Left vertebral artery slightly dominant. Vertebral arteries tortuous but widely patent to the skull base without stenosis, dissection or occlusion. Skeleton: No acute osseus abnormality. No worrisome lytic or blastic osseous lesions. Other neck: No acute soft tissue abnormality within the neck. Parotid glands are atrophic bilaterally. Submandibular glands normal. Subcentimeter hypodense nodule noted within the right lobe of thyroid, of doubtful significance. Thyroid otherwise unremarkable. No adenopathy. Upper chest: Visualized upper chest demonstrates no acute abnormality. Visualized upper lungs are clear. Centrilobular emphysema. Review of the MIP images confirms the above findings CTA HEAD FINDINGS Anterior circulation: Petrous segments patent bilaterally. Extensive calcified atherosclerosis throughout the cavernous and supraclinoid ICAs. There is a resultant severe high-grade stenosis at the anterior genu of the cavernous right ICA (series 11, image 116). Additional moderate multifocal narrowing throughout the remainder the cavernous/supraclinoid segments. ICA termini patent. Left A1 segment patent. Right A1 segment hypoplastic but faintly visible. Normal anterior communicating artery. Anterior cerebral arteries widely patent to their distal aspects without stenosis. M1 segments patent without stenosis. MCA branches well perfused and symmetric but demonstrate moderate  small vessel atheromatous irregularity. Posterior circulation: Vertebral arteries widely patent to the vertebrobasilar junction. Posterior inferior cerebral arteries patent bilaterally. Basilar artery widely patent to its distal aspect. Superior cerebellar and posterior cerebral arteries widely patent bilaterally. Small bilateral posterior communicating arteries noted. Venous sinuses: Patent. Anatomic variants: None significant. No aneurysm or vascular abnormality. Delayed phase: No abnormal enhancement. Review  of the MIP images confirms the above findings IMPRESSION: 1. Negative CTA for large vessel occlusion. No acute vascular abnormality identified. No aneurysm. No acute intracranial process. 2. Moderate to advanced atheromatous plaque throughout the carotid siphons with resultant severe near occlusive cavernous right ICA stenosis. 3. Atheromatous stenosis at the right carotid bifurcation of approximately 50% by NASCET criteria. 4. Approximate 3 cm benign arachnoid cyst overlying the right frontal convexity. 5. Emphysema. Electronically Signed   By: Jeannine Boga M.D.   On: 09/24/2017 02:19   Nm Myocar Multi W/spect W/wall Motion / Ef  Result Date: 09/24/2017  There was no ST segment deviation noted during stress.  Defect 1: There is a medium defect of moderate severity present in the basal anteroseptal, basal inferoseptal, mid anteroseptal, mid inferoseptal and apical septal location. This septal attenuation appears to be due to the LBBB  Nuclear stress EF: 68%. The left ventricular ejection fraction is hyperdynamic (>65%).  This is a low risk study. The study is normal.      Scheduled Meds: . aspirin EC  81 mg Oral Daily  . atorvastatin  40 mg Oral Daily  . B-complex with vitamin C  1 tablet Oral Daily  . buPROPion  150 mg Oral Daily  . busPIRone  30 mg Oral BID  . calcium-vitamin D  1 tablet Oral Q breakfast  . cholecalciferol  1,000 Units Oral Daily  . cycloSPORINE  1 drop Both Eyes  BID  . diltiazem  360 mg Oral Daily  . enoxaparin (LOVENOX) injection  40 mg Subcutaneous Daily  . folic acid  1 mg Oral Daily  . hydrALAZINE  50 mg Oral Q8H  . LORazepam  0-4 mg Intravenous Q6H   Followed by  . [START ON 09/26/2017] LORazepam  0-4 mg Intravenous Q12H  . nicotine  21 mg Transdermal Daily  . pantoprazole  40 mg Oral Daily  . potassium chloride  40 mEq Oral Q2H  . predniSONE  5 mg Oral Q breakfast  . thiamine  100 mg Oral Daily   Or  . thiamine  100 mg Intravenous Daily   Continuous Infusions: . sodium chloride       LOS: 0 days   Time Spent in minutes   30 minutes  Regina Ortiz D.O. on 09/25/2017 at 2:41 PM  Between 7am to 7pm - Pager - [phone]  After 7pm go to [email or web address] - password [email or web address]  And look for the night coverage person covering for me after hours  [facility] Office  [phone]

## 2017-09-25 NOTE — Care Management Note (Addendum)
Case Management Note  Patient Details  Name: Regina Ortiz MRN: [id number] Date of Birth: [birth year]  Subjective/Objective: Pt presented for Hypertensive Urgency and Chest Pain. Per MD notes pt's husband committed suicide that pt witnessed. Per MD- pt has declined psych consult and states that she has grief counseling set up via [facility]. CSW to follow for additional psychosocial needs.                    Action/Plan: No further needs from CM at this time.   Expected Discharge Date:                  Expected Discharge Plan:  Home/Self Care  In-House Referral:  Clinical Social Work  Discharge planning Services  CM Consult  Post Acute Care Choice:  NA Choice offered to:     DME Arranged:  N/A DME Agency:  NA  HH Arranged:  NA HH Agency:  NA  Status of Service:  Completed, signed off  If discussed at [place] of Stay Meetings, dates discussed:    Additional Comments:  Bethena Roys, RN 09/25/2017, 2:03 PM

## 2017-09-25 NOTE — Discharge Summary (Signed)
Physician Discharge Summary  Regina Ortiz EVO:[id number] DOB: Dec 15, 1945 DOA: 09/23/2017  PCP: Maurice Small, MD  Admit date: 09/23/2017 Discharge date: 09/27/2017  Time spent: 45 minutes  Recommendations for Outpatient Follow-up:  Patient will be discharged to home with home health physical therapy.  Patient will need to follow up with primary care provider within one week of discharge, repeat BMP.  Follow up with Dr. Scot Dock, vascular surgeon. Patient should continue medications as prescribed.  Patient should follow a regular diet.   Discharge Diagnoses:  Chest pain Severe right carotid disease Hypertensive urgency Acute kidney injury Hyponatremia, chronic Hypokalemia Depression/anxiety Rheumatoid arthritis Tobacco abuse/alcohol abuse Deconditioning and weakness  Discharge Condition: Stable   Diet recommendation: regular  Filed Weights   09/24/17 0459 09/26/17 0400 09/27/17 0658  Weight: 45.5 kg (100 lb 3.2 oz) 49.2 kg (108 lb 6.4 oz) 48.3 kg (106 lb 6.4 oz)    History of present illness:  On 09/24/2017 by Dr. Asencion Noble Morrisis a 72 y.o.femalewith medical history significant ofhypertension, hyperlipidemia, COPD, GERD, depression, anxiety, rheumatoid arthritis, left bundle blockage, chronic hyponatremia, tobacco abuse, alcohol use, who presents with chest pain and headache.  Patient states that she has been having chest pain in the past 2 days.The chest pain is located in the substernal area, sharp andalsopressure-like pain, constant, radiating to left arm and jaw. She also has frontal chest wall tenderness which is aggravated by deep breath. It is associated withmild SOBand lightheadedness. Patient also reports severe headache on the top of her head. No neck pain orrigidity. It is associated with dizziness. Denies unilateral weakness, numbness or tingling of the extremities. No vision change or hearing loss. Patient does not have nausea, vomiting, diarrhea,  abdominal pain, symptoms of UTI.Patient states that she drinks 2-3 beers every day. Patient states that she has a lot of stress at home due to recenthusbandsuicide.Initially patient had elevated blood pressure 203/74, which improved to 169/82 currently.  Hospital Course:  Chest pain -Troponins cycled and unremarkable -Cardiology consultation appreciated  -Nuclear stress test 09/24/2017 low risk study, EF 68%.  -Echocardiogram showed EF of 29-93%, grade 1 diastolic dysfunction -Suspect chest pain is likely secondary to stress since patient's husband committed suicide on 08/01/2017 -Per cardiology to further workup -continue aspirin  Severe right carotid disease -Patient with bilateral carotid bruits, left greater than right -Carotid ultrasound in 2017 showed 1-39% R ICA stenosis, 71-69% LICA stenosis -CTA: Moderate to advanced atheromatous plaque throughout the carotid siphons with resultant severe near occlusive cavernous right ICA stenosis.  -Carotid doppler: Right: 1-39% ICA stenosis. Left ICA 40-59% stenosis, left mid ICA 40-59% could be secondary to tortuous vessel. Both vertebral arteries were patent with antegrade flow. -vascular surgery consultation appreciated, recommended outpatient follow-up  Hypertensive urgency -BP improved, continue diltiazem, hydralazine -resume losartan upon discharge  Acute kidney injury -Resolved   Hyponatremia, chronic -has been low since 2016 -Sodium 125 -encouraged PO intake and limit water- drink fluids with electrolytes  -repeat BMP in one week  Hypokalemia -Replacing continue to monitor BMP  Depression/anxiety -Continue BuSpar, Prozac  Rheumatoid arthritis -Continue home dose of prednisone  Tobacco abuse/alcohol abuse -Discuss cessation, continue nicotine patch  Deconditioning and weakness -PT consulted- recommended home health  Consultants Cardiology  Procedures  Stress-lexiscan  Echocardiogram Carotid  doppler  Discharge Exam: Vitals:   09/27/17 0410 09/27/17 0934  BP: (!) 162/67 (!) 155/70  Pulse: 100   Resp: 18   Temp: 98.2 F (36.8 C)   SpO2: 99%  No longer complaining of chest pain. Feels tired and weak. States she did not sleep last night as she was crying.    General: Well developed, well nourished, NAD, appears stated age  27: NCAT, mucous membranes moist.  Cardiovascular: S1 S2 auscultated, no murmurs, RRR  Respiratory: Clear to auscultation bilaterally with equal chest rise  Abdomen: Soft, nontender, nondistended, + bowel sounds  Extremities: warm dry without cyanosis clubbing or edema  Neuro: AAOx3, nonfocal  Psych: Depressed  Discharge Instructions Discharge Instructions    Discharge instructions   Complete by:  As directed    Patient will be discharged to home with home health physical therapy.  Patient will need to follow up with primary care provider within one week of discharge, repeat BMP.  Follow up with Dr. Scot Dock, vascular surgeon. Patient should continue medications as prescribed.  Patient should follow a regular diet.     Allergies as of 09/27/2017      Reactions   Latex Swelling, Other (See Comments)    fever blisters   Doxycycline Other (See Comments)   Blurred vision, nervous, unsteady   Lactose Intolerance (gi)    Unknown, per pt   Lialda [mesalamine] Diarrhea   Prinivil [lisinopril] Diarrhea   Triamcinolone Acetonide Other (See Comments)   REDNESS AND PAIN   Amlodipine Rash   Lotensin [benazepril Hcl] Rash      Medication List    STOP taking these medications   valsartan 320 MG tablet Commonly known as:  DIOVAN     TAKE these medications   aspirin 81 MG EC tablet Take 1 tablet (81 mg total) by mouth daily.   atorvastatin 40 MG tablet Commonly known as:  LIPITOR TAKE ONE TABLET BY MOUTH ONCE DAILY What changed:    how much to take  how to take this  when to take this   b complex vitamins capsule Take 1  capsule by mouth daily.   BIOTIN PO Take 5,000 mg by mouth 2 (two) times daily.   buPROPion 150 MG 12 hr tablet Commonly known as:  WELLBUTRIN SR Take 150 mg by mouth daily.   busPIRone 30 MG tablet Commonly known as:  BUSPAR Take 30 mg by mouth 2 (two) times daily.   CALTRATE 600+D PO Take 1 tablet by mouth daily.   cycloSPORINE 0.05 % ophthalmic emulsion Commonly known as:  RESTASIS Place 1 drop into both eyes 2 (two) times daily.   diltiazem 360 MG 24 hr capsule Commonly known as:  TIAZAC Take 360 mg by mouth daily.   FLUoxetine 20 MG tablet Commonly known as:  PROZAC Take 10-20 mg by mouth See admin instructions. Take 1/2 tablet daily for 7 days then increase to 1 tablet daily   folic acid 1 MG tablet Commonly known as:  FOLVITE Take 1 tablet (1 mg total) by mouth daily.   hydrALAZINE 50 MG tablet Commonly known as:  APRESOLINE Take 1 tablet (50 mg total) by mouth every 8 (eight) hours.   Lysine 500 MG Tabs Take 500 mg by mouth 2 (two) times daily.   nicotine 21 mg/24hr patch Commonly known as:  NICODERM CQ - dosed in mg/24 hours Place 1 patch (21 mg total) onto the skin daily.   omeprazole 20 MG capsule Commonly known as:  PRILOSEC Take 20 mg by mouth every other day.   POTASSIMIN PO Take 1 tablet by mouth every evening.   predniSONE 5 MG tablet Commonly known as:  DELTASONE Take 5 mg by mouth daily  with breakfast. --  LONG-TERM USE FOR RA   PRESCRIPTION MEDICATION Take 1 tablet by mouth at bedtime. New Medication for Sleep   thiamine 100 MG tablet Take 1 tablet (100 mg total) by mouth daily.   Vitamin D3 1000 units Caps Take 1,000 Units by mouth daily.   zolpidem 10 MG tablet Commonly known as:  AMBIEN Take 0.5 tablets (5 mg total) by mouth at bedtime as needed for sleep. What changed:  how much to take      Allergies  Allergen Reactions  . Latex Swelling and Other (See Comments)     fever blisters  . Doxycycline Other (See Comments)     Blurred vision, nervous, unsteady  . Lactose Intolerance (Gi)     Unknown, per pt  . Lialda [Mesalamine] Diarrhea  . Prinivil [Lisinopril] Diarrhea  . Triamcinolone Acetonide Other (See Comments)    REDNESS AND PAIN  . Amlodipine Rash  . Lotensin [Benazepril Hcl] Rash      The results of significant diagnostics from this hospitalization (including imaging, microbiology, ancillary and laboratory) are listed below for reference.    Significant Diagnostic Studies: Ct Angio Head W Or Wo Contrast  Result Date: 09/24/2017 CLINICAL DATA:  Initial evaluation for acute headache. EXAM: CT ANGIOGRAPHY HEAD AND NECK TECHNIQUE: Multidetector CT imaging of the head and neck was performed using the standard protocol during bolus administration of intravenous contrast. Multiplanar CT image reconstructions and MIPs were obtained to evaluate the vascular anatomy. Carotid stenosis measurements (when applicable) are obtained utilizing NASCET criteria, using the distal internal carotid diameter as the denominator. CONTRAST:  17mL ISOVUE-370 IOPAMIDOL (ISOVUE-370) INJECTION 76% COMPARISON:  None. FINDINGS: CT HEAD FINDINGS Brain: Cerebral volume within normal limits for patient age. Mild chronic microvascular changes. No evidence for acute intracranial hemorrhage. No findings to suggest acute large vessel territory infarct. No mass lesion, midline shift, or mass effect. Ventricles are normal in size without evidence for hydrocephalus. 1.6 x 2.9 cm arachnoid cyst overlies the right frontal convexity (series 4, image 20). No other extra-axial fluid collection. Vascular: No hyperdense vessel identified. Calcified atherosclerosis present at the skull base. Skull: Scalp soft tissues demonstrate no acute abnormality. Calvarium intact. Sinuses/Orbits: Globes and orbital soft tissues within normal limits. Moderate layering opacity within left sphenoid sinus. Paranasal sinuses are otherwise clear. Trace bilateral mastoid  effusions. CTA NECK FINDINGS Aortic arch: Moderate calcified plaque within the partially visualized aortic arch. Origin of the great vessels not included on this exam. Visualized subclavian arteries patent without stenosis. Right carotid system: Right common carotid artery widely patent to the bifurcation. A centric calcified plaque about the right bifurcation/proximal right ICA with associated stenosis of up to 50% by NASCET criteria. Right ICA tortuous but widely patent to the skull base without additional stenosis. Left carotid system: Visualized left common carotid artery patent to the bifurcation without flow-limiting stenosis. A centric calcified plaque about the left bifurcation without hemodynamically significant stenosis. Left ICA tortuous but widely patent to the skull base. Vertebral arteries: Both of the vertebral arteries arise from the subclavian arteries. Left vertebral artery slightly dominant. Vertebral arteries tortuous but widely patent to the skull base without stenosis, dissection or occlusion. Skeleton: No acute osseus abnormality. No worrisome lytic or blastic osseous lesions. Other neck: No acute soft tissue abnormality within the neck. Parotid glands are atrophic bilaterally. Submandibular glands normal. Subcentimeter hypodense nodule noted within the right lobe of thyroid, of doubtful significance. Thyroid otherwise unremarkable. No adenopathy. Upper chest: Visualized upper chest demonstrates no  acute abnormality. Visualized upper lungs are clear. Centrilobular emphysema. Review of the MIP images confirms the above findings CTA HEAD FINDINGS Anterior circulation: Petrous segments patent bilaterally. Extensive calcified atherosclerosis throughout the cavernous and supraclinoid ICAs. There is a resultant severe high-grade stenosis at the anterior genu of the cavernous right ICA (series 11, image 116). Additional moderate multifocal narrowing throughout the remainder the cavernous/supraclinoid  segments. ICA termini patent. Left A1 segment patent. Right A1 segment hypoplastic but faintly visible. Normal anterior communicating artery. Anterior cerebral arteries widely patent to their distal aspects without stenosis. M1 segments patent without stenosis. MCA branches well perfused and symmetric but demonstrate moderate small vessel atheromatous irregularity. Posterior circulation: Vertebral arteries widely patent to the vertebrobasilar junction. Posterior inferior cerebral arteries patent bilaterally. Basilar artery widely patent to its distal aspect. Superior cerebellar and posterior cerebral arteries widely patent bilaterally. Small bilateral posterior communicating arteries noted. Venous sinuses: Patent. Anatomic variants: None significant. No aneurysm or vascular abnormality. Delayed phase: No abnormal enhancement. Review of the MIP images confirms the above findings IMPRESSION: 1. Negative CTA for large vessel occlusion. No acute vascular abnormality identified. No aneurysm. No acute intracranial process. 2. Moderate to advanced atheromatous plaque throughout the carotid siphons with resultant severe near occlusive cavernous right ICA stenosis. 3. Atheromatous stenosis at the right carotid bifurcation of approximately 50% by NASCET criteria. 4. Approximate 3 cm benign arachnoid cyst overlying the right frontal convexity. 5. Emphysema. Electronically Signed   By: Jeannine Boga M.D.   On: 09/24/2017 02:19   Dg Chest 2 View  Result Date: 09/23/2017 CLINICAL DATA:  Chest tightness and shortness of breath EXAM: CHEST  2 VIEW COMPARISON:  Chest radiograph 06/22/2012 FINDINGS: The lungs are hyperinflated. The heart size and mediastinal contours are within normal limits. There is calcific aortic atherosclerosis. Both lungs are clear. The visualized skeletal structures are unremarkable. IMPRESSION: Hyperinflated lungs, consistent with COPD. Unchanged calcific aortic atherosclerosis without focal  airspace disease or pulmonary edema. Aortic Atherosclerosis (ICD10-I70.0) and Emphysema (ICD10-J43.9). Electronically Signed   By: Ulyses Jarred M.D.   On: 09/23/2017 20:12   Ct Angio Neck W And/or Wo Contrast  Result Date: 09/24/2017 CLINICAL DATA:  Initial evaluation for acute headache. EXAM: CT ANGIOGRAPHY HEAD AND NECK TECHNIQUE: Multidetector CT imaging of the head and neck was performed using the standard protocol during bolus administration of intravenous contrast. Multiplanar CT image reconstructions and MIPs were obtained to evaluate the vascular anatomy. Carotid stenosis measurements (when applicable) are obtained utilizing NASCET criteria, using the distal internal carotid diameter as the denominator. CONTRAST:  22mL ISOVUE-370 IOPAMIDOL (ISOVUE-370) INJECTION 76% COMPARISON:  None. FINDINGS: CT HEAD FINDINGS Brain: Cerebral volume within normal limits for patient age. Mild chronic microvascular changes. No evidence for acute intracranial hemorrhage. No findings to suggest acute large vessel territory infarct. No mass lesion, midline shift, or mass effect. Ventricles are normal in size without evidence for hydrocephalus. 1.6 x 2.9 cm arachnoid cyst overlies the right frontal convexity (series 4, image 20). No other extra-axial fluid collection. Vascular: No hyperdense vessel identified. Calcified atherosclerosis present at the skull base. Skull: Scalp soft tissues demonstrate no acute abnormality. Calvarium intact. Sinuses/Orbits: Globes and orbital soft tissues within normal limits. Moderate layering opacity within left sphenoid sinus. Paranasal sinuses are otherwise clear. Trace bilateral mastoid effusions. CTA NECK FINDINGS Aortic arch: Moderate calcified plaque within the partially visualized aortic arch. Origin of the great vessels not included on this exam. Visualized subclavian arteries patent without stenosis. Right carotid system: Right common carotid artery  widely patent to the bifurcation.  A centric calcified plaque about the right bifurcation/proximal right ICA with associated stenosis of up to 50% by NASCET criteria. Right ICA tortuous but widely patent to the skull base without additional stenosis. Left carotid system: Visualized left common carotid artery patent to the bifurcation without flow-limiting stenosis. A centric calcified plaque about the left bifurcation without hemodynamically significant stenosis. Left ICA tortuous but widely patent to the skull base. Vertebral arteries: Both of the vertebral arteries arise from the subclavian arteries. Left vertebral artery slightly dominant. Vertebral arteries tortuous but widely patent to the skull base without stenosis, dissection or occlusion. Skeleton: No acute osseus abnormality. No worrisome lytic or blastic osseous lesions. Other neck: No acute soft tissue abnormality within the neck. Parotid glands are atrophic bilaterally. Submandibular glands normal. Subcentimeter hypodense nodule noted within the right lobe of thyroid, of doubtful significance. Thyroid otherwise unremarkable. No adenopathy. Upper chest: Visualized upper chest demonstrates no acute abnormality. Visualized upper lungs are clear. Centrilobular emphysema. Review of the MIP images confirms the above findings CTA HEAD FINDINGS Anterior circulation: Petrous segments patent bilaterally. Extensive calcified atherosclerosis throughout the cavernous and supraclinoid ICAs. There is a resultant severe high-grade stenosis at the anterior genu of the cavernous right ICA (series 11, image 116). Additional moderate multifocal narrowing throughout the remainder the cavernous/supraclinoid segments. ICA termini patent. Left A1 segment patent. Right A1 segment hypoplastic but faintly visible. Normal anterior communicating artery. Anterior cerebral arteries widely patent to their distal aspects without stenosis. M1 segments patent without stenosis. MCA branches well perfused and symmetric but  demonstrate moderate small vessel atheromatous irregularity. Posterior circulation: Vertebral arteries widely patent to the vertebrobasilar junction. Posterior inferior cerebral arteries patent bilaterally. Basilar artery widely patent to its distal aspect. Superior cerebellar and posterior cerebral arteries widely patent bilaterally. Small bilateral posterior communicating arteries noted. Venous sinuses: Patent. Anatomic variants: None significant. No aneurysm or vascular abnormality. Delayed phase: No abnormal enhancement. Review of the MIP images confirms the above findings IMPRESSION: 1. Negative CTA for large vessel occlusion. No acute vascular abnormality identified. No aneurysm. No acute intracranial process. 2. Moderate to advanced atheromatous plaque throughout the carotid siphons with resultant severe near occlusive cavernous right ICA stenosis. 3. Atheromatous stenosis at the right carotid bifurcation of approximately 50% by NASCET criteria. 4. Approximate 3 cm benign arachnoid cyst overlying the right frontal convexity. 5. Emphysema. Electronically Signed   By: Jeannine Boga M.D.   On: 09/24/2017 02:19   Nm Myocar Multi W/spect W/wall Motion / Ef  Result Date: 09/24/2017  There was no ST segment deviation noted during stress.  Defect 1: There is a medium defect of moderate severity present in the basal anteroseptal, basal inferoseptal, mid anteroseptal, mid inferoseptal and apical septal location. This septal attenuation appears to be due to the LBBB  Nuclear stress EF: 68%. The left ventricular ejection fraction is hyperdynamic (>65%).  This is a low risk study. The study is normal.     Microbiology: No results found for this or any previous visit (from the past 240 hour(s)).   Labs: Basic Metabolic Panel: Recent Labs  Lab 09/23/17 1856 09/24/17 1507 09/26/17 0448 09/26/17 1156 09/27/17 0411  NA 125* 126* 123* 123* 125*  K 3.8 3.0* 4.3  --  3.5  CL 91* 94* 97*  --  99*   CO2 20* 22 19*  --  19*  GLUCOSE 112* 102* 106*  --  123*  BUN 9 7 9   --  7  CREATININE  1.22* 0.95 0.97  --  1.01*  CALCIUM 9.7 8.3* 8.3*  --  8.2*   Liver Function Tests: No results for input(s): AST, ALT, ALKPHOS, BILITOT, PROT, ALBUMIN in the last 168 hours. No results for input(s): LIPASE, AMYLASE in the last 168 hours. No results for input(s): AMMONIA in the last 168 hours. CBC: Recent Labs  Lab 09/23/17 1856 09/24/17 1507  WBC 7.9 8.0  HGB 12.0 10.9*  HCT 33.2* 30.5*  MCV 101.5* 102.0*  PLT 195 180   Cardiac Enzymes: Recent Labs  Lab 09/24/17 0343 09/24/17 0857 09/24/17 1507  TROPONINI <0.03 <0.03 <0.03   BNP: BNP (last 3 results) No results for input(s): BNP in the last 8760 hours.  ProBNP (last 3 results) No results for input(s): PROBNP in the last 8760 hours.  CBG: No results for input(s): GLUCAP in the last 168 hours.     Signed:  Cristal Ford  [facility] 09/27/2017, 11:00 AM
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
## 2017-09-25 NOTE — Plan of Care (Signed)
Ambulates independently with standby assist. No c/o dyspnea or signs of distress. Educated on use of call light system. Verbalizes understanding of need to call for assistance prior to ambulation

## 2017-09-25 NOTE — Clinical Social Work Note (Signed)
Clinical Social Work Assessment  Patient Details  Name: Regina Ortiz MRN: [id number] Date of Birth: 07-30-1946  Date of referral:  09/25/17               Reason for consult:  Emotional/Coping/Adjustment to Illness, [employer] sought to share information with:    Permission granted to share information::     Name::        Agency::     Relationship::     Contact Information:     Housing/Transportation Living arrangements for the past 2 months:  Single Family Home Source of Information:  Patient Patient Interpreter Needed:  None Criminal Activity/Legal Involvement Pertinent to Current Situation/Hospitalization:  No - Comment as needed Significant Relationships:  Adult Children, Other Family Members, Neighbor, Friend Lives with:  Self Do you feel safe going back to the place where you live?  Yes Need for family participation in patient care:  Yes (Comment)  Care giving concerns: Patient from home. Patient's husband died by suicide recently.   Social Worker assessment / plan: CSW met with patient at bedside - patient alert and oriented, though reported feeling weak and tired. CSW and patient discussed grief process and counseling resources. Patient is already connected with [facility] grief counseling. She has an appointment with them on 10/13/17.   Patient discussed how she has been grieving her husband's suicide. She acknowledged she has been avoiding it by going through the motions of legal and financial paperwork and keeping up her house. Patient said that her nephew plans to come visit soon and help out and she will start focusing on working through her grief. Patient reports having several close friends and family members with whom she stays in contact; patient has a strong support network.  Patient indicated her PCP gave her a list of counseling resources, so she has others if the counselor at [facility] does not work out.  Patient is motivated to work through her grief in a healthy way. CSW signing off, as patient has all needed resources.  Employment status:  Retired Archivist) PT Recommendations:  Not assessed at this time [place] / Referral to community resources:  Support Groups, Other (Comment Required)(Hospice and [place] - counseling)  Patient/Family's Response to care: Patient appreciative of care she has received in hospital. Patient appreciative of resources.  Patient/Family's Understanding of and Emotional Response to Diagnosis, Current Treatment, and Prognosis: Patient with good understanding of her condition and eager to start counseling.  Emotional Assessment Appearance:  Appears stated age Attitude/Demeanor/Rapport:  Lethargic, Engaged Affect (typically observed):  Calm, Pleasant Orientation:  Oriented to Self, Oriented to Place, Oriented to  Time, Oriented to Situation Alcohol / Substance use:  Not Applicable Psych involvement (Current and /or in the community):  No (Comment)  Discharge Needs  Concerns to be addressed:  Coping/Stress Concerns, Grief and Loss Concerns Readmission within the last 30 days:  No Current discharge risk:  Lives alone Barriers to Discharge:  Continued Medical Work up   Estanislado Emms, LCSW 09/25/2017, 3:14 PM

## 2017-09-25 NOTE — Progress Notes (Addendum)
Progress Note  Patient Name: Regina Ortiz Date of Encounter: 09/25/2017  Primary Cardiologist: Candee Furbish, MD   Subjective   Feeling stressed.  She dropped coffee and skinned her arm.  She had a difficult time sleeping last night.  She denies chest pain or shortness of breath.   Inpatient Medications    Scheduled Meds: . aspirin EC  81 mg Oral Daily  . atorvastatin  40 mg Oral Daily  . B-complex with vitamin C  1 tablet Oral Daily  . buPROPion  150 mg Oral Daily  . busPIRone  30 mg Oral BID  . calcium-vitamin D  1 tablet Oral Q breakfast  . cholecalciferol  1,000 Units Oral Daily  . cycloSPORINE  1 drop Both Eyes BID  . diltiazem  360 mg Oral Daily  . enoxaparin (LOVENOX) injection  40 mg Subcutaneous Daily  . folic acid  1 mg Oral Daily  . hydrALAZINE  25 mg Oral Q8H  . LORazepam  0-4 mg Intravenous Q6H   Followed by  . [START ON 09/26/2017] LORazepam  0-4 mg Intravenous Q12H  . nicotine  21 mg Transdermal Daily  . pantoprazole  40 mg Oral Daily  . predniSONE  5 mg Oral Q breakfast  . thiamine  100 mg Oral Daily   Or  . thiamine  100 mg Intravenous Daily   Continuous Infusions:  PRN Meds: acetaminophen, alum & mag hydroxide-simeth, aspirin-acetaminophen-caffeine, hydrALAZINE, LORazepam **OR** LORazepam, morphine injection, nitroGLYCERIN, ondansetron (ZOFRAN) IV, zolpidem   Vital Signs    Vitals:   09/24/17 1214 09/24/17 1634 09/24/17 2017 09/25/17 0500  BP: (!) 162/56 (!) 149/64 (!) 160/71 (!) 173/64  Pulse: 90 87 74 79  Resp:   16 16  Temp:  97.6 F (36.4 C) 98.4 F (36.9 C) 100.2 F (37.9 C)  TempSrc:  Oral Oral Oral  SpO2:  98% 97% 97%  Weight:      Height:        Intake/Output Summary (Last 24 hours) at 09/25/2017 1222 Last data filed at 09/25/2017 0506 Gross per 24 hour  Intake 2051.25 ml  Output 400 ml  Net 1651.25 ml   Filed Weights   09/24/17 0137 09/24/17 0459  Weight: 103 lb (46.7 kg) 100 lb 3.2 oz (45.5 kg)    Telemetry    Sinus  rhythm.  No events.  - Personally Reviewed  ECG    No new tracings.  - Personally Reviewed  Physical Exam   VS:  BP (!) 173/64 (BP Location: Right Arm)   Pulse 79   Temp 100.2 F (37.9 C) (Oral)   Resp 16   Ht 5\' 4"  (1.626 m)   Wt 100 lb 3.2 oz (45.5 kg)   LMP  (LMP Unknown)   SpO2 97%   BMI 17.20 kg/m  , BMI Body mass index is 17.2 kg/m. GENERAL:  Well appearing HEENT: Pupils equal round and reactive, fundi not visualized, oral mucosa unremarkable NECK:  No jugular venous distention, waveform within normal limits, carotid upstroke brisk and symmetric, no bruits LUNGS:  Clear to auscultation bilaterally HEART:  RRR.  PMI not displaced or sustained,S1 and S2 within normal limits, no S3, no S4, no clicks, no rubs, no murmurs ABD:  Flat, positive bowel sounds normal in frequency in pitch, no bruits, no rebound, no guarding, no midline pulsatile mass, no hepatomegaly, no splenomegaly EXT:  2 plus pulses throughout, no edema, no cyanosis no clubbing SKIN:  No rashes no nodules NEURO:  Cranial nerves  II through XII grossly intact, motor grossly intact throughout [facility]:  Cognitively intact, oriented to person place and time   Labs    Chemistry Recent Labs  Lab 09/23/17 1856 09/24/17 1507  NA 125* 126*  K 3.8 3.0*  CL 91* 94*  CO2 20* 22  GLUCOSE 112* 102*  BUN 9 7  CREATININE 1.22* 0.95  CALCIUM 9.7 8.3*  GFRNONAA 43* 58*  GFRAA 50* >60  ANIONGAP 14 10     Hematology Recent Labs  Lab 09/23/17 1856 09/24/17 1507  WBC 7.9 8.0  RBC 3.27* 2.99*  HGB 12.0 10.9*  HCT 33.2* 30.5*  MCV 101.5* 102.0*  MCH 36.7* 36.5*  MCHC 36.1* 35.7  RDW 13.3 13.7  PLT 195 180    Cardiac Enzymes Recent Labs  Lab 09/24/17 0343 09/24/17 0857 09/24/17 1507  TROPONINI <0.03 <0.03 <0.03    Recent Labs  Lab 09/23/17 1906 09/24/17 0041  TROPIPOC 0.02 0.02     BNPNo results for input(s): BNP, PROBNP in the last 168 hours.   DDimer No results for input(s): DDIMER in the  last 168 hours.   Radiology    Ct Angio Head W Or Wo Contrast  Result Date: 09/24/2017 CLINICAL DATA:  Initial evaluation for acute headache. EXAM: CT ANGIOGRAPHY HEAD AND NECK TECHNIQUE: Multidetector CT imaging of the head and neck was performed using the standard protocol during bolus administration of intravenous contrast. Multiplanar CT image reconstructions and MIPs were obtained to evaluate the vascular anatomy. Carotid stenosis measurements (when applicable) are obtained utilizing NASCET criteria, using the distal internal carotid diameter as the denominator. CONTRAST:  39mL ISOVUE-370 IOPAMIDOL (ISOVUE-370) INJECTION 76% COMPARISON:  None. FINDINGS: CT HEAD FINDINGS Brain: Cerebral volume within normal limits for patient age. Mild chronic microvascular changes. No evidence for acute intracranial hemorrhage. No findings to suggest acute large vessel territory infarct. No mass lesion, midline shift, or mass effect. Ventricles are normal in size without evidence for hydrocephalus. 1.6 x 2.9 cm arachnoid cyst overlies the right frontal convexity (series 4, image 20). No other extra-axial fluid collection. Vascular: No hyperdense vessel identified. Calcified atherosclerosis present at the skull base. Skull: Scalp soft tissues demonstrate no acute abnormality. Calvarium intact. Sinuses/Orbits: Globes and orbital soft tissues within normal limits. Moderate layering opacity within left sphenoid sinus. Paranasal sinuses are otherwise clear. Trace bilateral mastoid effusions. CTA NECK FINDINGS Aortic arch: Moderate calcified plaque within the partially visualized aortic arch. Origin of the great vessels not included on this exam. Visualized subclavian arteries patent without stenosis. Right carotid system: Right common carotid artery widely patent to the bifurcation. A centric calcified plaque about the right bifurcation/proximal right ICA with associated stenosis of up to 50% by NASCET criteria. Right ICA  tortuous but widely patent to the skull base without additional stenosis. Left carotid system: Visualized left common carotid artery patent to the bifurcation without flow-limiting stenosis. A centric calcified plaque about the left bifurcation without hemodynamically significant stenosis. Left ICA tortuous but widely patent to the skull base. Vertebral arteries: Both of the vertebral arteries arise from the subclavian arteries. Left vertebral artery slightly dominant. Vertebral arteries tortuous but widely patent to the skull base without stenosis, dissection or occlusion. Skeleton: No acute osseus abnormality. No worrisome lytic or blastic osseous lesions. Other neck: No acute soft tissue abnormality within the neck. Parotid glands are atrophic bilaterally. Submandibular glands normal. Subcentimeter hypodense nodule noted within the right lobe of thyroid, of doubtful significance. Thyroid otherwise unremarkable. No adenopathy. Upper chest: Visualized upper chest  demonstrates no acute abnormality. Visualized upper lungs are clear. Centrilobular emphysema. Review of the MIP images confirms the above findings CTA HEAD FINDINGS Anterior circulation: Petrous segments patent bilaterally. Extensive calcified atherosclerosis throughout the cavernous and supraclinoid ICAs. There is a resultant severe high-grade stenosis at the anterior genu of the cavernous right ICA (series 11, image 116). Additional moderate multifocal narrowing throughout the remainder the cavernous/supraclinoid segments. ICA termini patent. Left A1 segment patent. Right A1 segment hypoplastic but faintly visible. Normal anterior communicating artery. Anterior cerebral arteries widely patent to their distal aspects without stenosis. M1 segments patent without stenosis. MCA branches well perfused and symmetric but demonstrate moderate small vessel atheromatous irregularity. Posterior circulation: Vertebral arteries widely patent to the vertebrobasilar  junction. Posterior inferior cerebral arteries patent bilaterally. Basilar artery widely patent to its distal aspect. Superior cerebellar and posterior cerebral arteries widely patent bilaterally. Small bilateral posterior communicating arteries noted. Venous sinuses: Patent. Anatomic variants: None significant. No aneurysm or vascular abnormality. Delayed phase: No abnormal enhancement. Review of the MIP images confirms the above findings IMPRESSION: 1. Negative CTA for large vessel occlusion. No acute vascular abnormality identified. No aneurysm. No acute intracranial process. 2. Moderate to advanced atheromatous plaque throughout the carotid siphons with resultant severe near occlusive cavernous right ICA stenosis. 3. Atheromatous stenosis at the right carotid bifurcation of approximately 50% by NASCET criteria. 4. Approximate 3 cm benign arachnoid cyst overlying the right frontal convexity. 5. Emphysema. Electronically Signed   By: Jeannine Boga M.D.   On: 09/24/2017 02:19   Dg Chest 2 View  Result Date: 09/23/2017 CLINICAL DATA:  Chest tightness and shortness of breath EXAM: CHEST  2 VIEW COMPARISON:  Chest radiograph 06/22/2012 FINDINGS: The lungs are hyperinflated. The heart size and mediastinal contours are within normal limits. There is calcific aortic atherosclerosis. Both lungs are clear. The visualized skeletal structures are unremarkable. IMPRESSION: Hyperinflated lungs, consistent with COPD. Unchanged calcific aortic atherosclerosis without focal airspace disease or pulmonary edema. Aortic Atherosclerosis (ICD10-I70.0) and Emphysema (ICD10-J43.9). Electronically Signed   By: Ulyses Jarred M.D.   On: 09/23/2017 20:12   Ct Angio Neck W And/or Wo Contrast  Result Date: 09/24/2017 CLINICAL DATA:  Initial evaluation for acute headache. EXAM: CT ANGIOGRAPHY HEAD AND NECK TECHNIQUE: Multidetector CT imaging of the head and neck was performed using the standard protocol during bolus  administration of intravenous contrast. Multiplanar CT image reconstructions and MIPs were obtained to evaluate the vascular anatomy. Carotid stenosis measurements (when applicable) are obtained utilizing NASCET criteria, using the distal internal carotid diameter as the denominator. CONTRAST:  31mL ISOVUE-370 IOPAMIDOL (ISOVUE-370) INJECTION 76% COMPARISON:  None. FINDINGS: CT HEAD FINDINGS Brain: Cerebral volume within normal limits for patient age. Mild chronic microvascular changes. No evidence for acute intracranial hemorrhage. No findings to suggest acute large vessel territory infarct. No mass lesion, midline shift, or mass effect. Ventricles are normal in size without evidence for hydrocephalus. 1.6 x 2.9 cm arachnoid cyst overlies the right frontal convexity (series 4, image 20). No other extra-axial fluid collection. Vascular: No hyperdense vessel identified. Calcified atherosclerosis present at the skull base. Skull: Scalp soft tissues demonstrate no acute abnormality. Calvarium intact. Sinuses/Orbits: Globes and orbital soft tissues within normal limits. Moderate layering opacity within left sphenoid sinus. Paranasal sinuses are otherwise clear. Trace bilateral mastoid effusions. CTA NECK FINDINGS Aortic arch: Moderate calcified plaque within the partially visualized aortic arch. Origin of the great vessels not included on this exam. Visualized subclavian arteries patent without stenosis. Right carotid system: Right common  carotid artery widely patent to the bifurcation. A centric calcified plaque about the right bifurcation/proximal right ICA with associated stenosis of up to 50% by NASCET criteria. Right ICA tortuous but widely patent to the skull base without additional stenosis. Left carotid system: Visualized left common carotid artery patent to the bifurcation without flow-limiting stenosis. A centric calcified plaque about the left bifurcation without hemodynamically significant stenosis. Left ICA  tortuous but widely patent to the skull base. Vertebral arteries: Both of the vertebral arteries arise from the subclavian arteries. Left vertebral artery slightly dominant. Vertebral arteries tortuous but widely patent to the skull base without stenosis, dissection or occlusion. Skeleton: No acute osseus abnormality. No worrisome lytic or blastic osseous lesions. Other neck: No acute soft tissue abnormality within the neck. Parotid glands are atrophic bilaterally. Submandibular glands normal. Subcentimeter hypodense nodule noted within the right lobe of thyroid, of doubtful significance. Thyroid otherwise unremarkable. No adenopathy. Upper chest: Visualized upper chest demonstrates no acute abnormality. Visualized upper lungs are clear. Centrilobular emphysema. Review of the MIP images confirms the above findings CTA HEAD FINDINGS Anterior circulation: Petrous segments patent bilaterally. Extensive calcified atherosclerosis throughout the cavernous and supraclinoid ICAs. There is a resultant severe high-grade stenosis at the anterior genu of the cavernous right ICA (series 11, image 116). Additional moderate multifocal narrowing throughout the remainder the cavernous/supraclinoid segments. ICA termini patent. Left A1 segment patent. Right A1 segment hypoplastic but faintly visible. Normal anterior communicating artery. Anterior cerebral arteries widely patent to their distal aspects without stenosis. M1 segments patent without stenosis. MCA branches well perfused and symmetric but demonstrate moderate small vessel atheromatous irregularity. Posterior circulation: Vertebral arteries widely patent to the vertebrobasilar junction. Posterior inferior cerebral arteries patent bilaterally. Basilar artery widely patent to its distal aspect. Superior cerebellar and posterior cerebral arteries widely patent bilaterally. Small bilateral posterior communicating arteries noted. Venous sinuses: Patent. Anatomic variants: None  significant. No aneurysm or vascular abnormality. Delayed phase: No abnormal enhancement. Review of the MIP images confirms the above findings IMPRESSION: 1. Negative CTA for large vessel occlusion. No acute vascular abnormality identified. No aneurysm. No acute intracranial process. 2. Moderate to advanced atheromatous plaque throughout the carotid siphons with resultant severe near occlusive cavernous right ICA stenosis. 3. Atheromatous stenosis at the right carotid bifurcation of approximately 50% by NASCET criteria. 4. Approximate 3 cm benign arachnoid cyst overlying the right frontal convexity. 5. Emphysema. Electronically Signed   By: Jeannine Boga M.D.   On: 09/24/2017 02:19   Nm Myocar Multi W/spect W/wall Motion / Ef  Result Date: 09/24/2017  There was no ST segment deviation noted during stress.  Defect 1: There is a medium defect of moderate severity present in the basal anteroseptal, basal inferoseptal, mid anteroseptal, mid inferoseptal and apical septal location. This septal attenuation appears to be due to the LBBB  Nuclear stress EF: 68%. The left ventricular ejection fraction is hyperdynamic (>65%).  This is a low risk study. The study is normal.     Cardiac Studies   Echocardiogram 09/24/2017 Study Conclusions - Left ventricle: The cavity size was normal. Wall thickness was   normal. Systolic function was normal. The estimated ejection   fraction was in the range of 55% to 60%. Wall motion was normal;   there were no regional wall motion abnormalities. Doppler   parameters are consistent with abnormal left ventricular   relaxation (grade 1 diastolic dysfunction). - Ventricular septum: Septal motion showed abnormal function and   dyssynergy.  Impressions: - Normal LV systolic  function; mild diastolic dysfunction.  Patient Profile     72 y.o. female  with left bundle branch block, ongoing tobacco use, hypertension, COPD, alcohol abuse, and chronic hyponatremia here  with chest pain. Pt with stress after her husband's suicide. Symptoms are atypical. Normal stress test on 09/24/17.   Assessment & Plan    Atypical chest pain -Ruled out for MI -Had normal nuclear stress test on 09/24/17.  -Echo shows normal LV function, no regional wall motion abnormalities, grade 1 DD.  -Her chest pain is likely related to the stress that she has been experiencing since her husband's suicide on 08/01/17.  -No further workup  Severe right carotid disease -Bilateral carotid bruits present, L>R -carotid ultrasound in 09/2015 showed, 1-39% RICA stenosis, 12-19% LICA stenosis, elevated velocities due to tortuosity. -Pt with frontal headache and dizziness.  -Per CTA: Moderate to advanced atheromatous plaque throughout the carotid siphons with resultant severe near occlusive cavernous right ICA stenosis. Atheromatous stenosis at the right carotid bifurcation of approximately 50% by NASCET criteria.  -Vascular surgery consulted. Since her stenosis is above the skull base, they are not sure if this can be stented. They have ordered a carotid duplex scan as this may better determine the severity of the extracranial disease. They would consider endarterectomy if >80% stenosis.  Given her low risk stress test she would be at low risk for surgery.  NSQIP risk of perioperative MI or death is 1.1%. -Continue statin and aspirin.   Hypertension -Home meds include diltiazem 360 mg and Diovan 320 mg -ARB is on hold 2/2 AKI which is improving - Increase hydralazine to 50mg  q8h  AKI -SCr 1.22. Usually has normal renal function. Had some elevated SCr in 2014, resolved.  -Was hydrated. Follow up SCr 0.95  Depression -Depressive episode related to suicide of her husband on 08/01/17. Is stressed dealing with his loss and settling the estate.  -On Buspar and Wellbutrin  Tobacco use -Continues to smoke, advise cessation  Hypokalemia -K+ 3.0 yesterday afternoon. Supplementation given.  Monitor levels.    We will sign off.  Please call if there are questions.   For questions or updates, please contact [facility] Please consult [email or web address] for contact info under Cardiology/STEMI.      Signed, Daune Perch, NP  09/25/2017, 12:22 PM

## 2017-09-26 LAB — BASIC METABOLIC PANEL
Anion gap: 7 (ref 5–15)
BUN: 9 mg/dL (ref 6–20)
CALCIUM: 8.3 mg/dL — AB (ref 8.9–10.3)
CHLORIDE: 97 mmol/L — AB (ref 101–111)
CO2: 19 mmol/L — AB (ref 22–32)
CREATININE: 0.97 mg/dL (ref 0.44–1.00)
GFR calc Af Amer: >60 mL/min (ref >60)
GFR calc non Af Amer: 57 mL/min — ABNORMAL LOW (ref >60)
Glucose, Bld: 106 mg/dL — ABNORMAL HIGH (ref 65–99)
Potassium: 4.3 mmol/L (ref 3.5–5.1)
Sodium: 123 mmol/L — ABNORMAL LOW (ref 135–145)

## 2017-09-26 LAB — SODIUM: Sodium: 123 mmol/L — ABNORMAL LOW (ref 135–145)

## 2017-09-26 MED ORDER — SODIUM CHLORIDE 0.9 % IV SOLN
INTRAVENOUS | Status: DC
  Administered 2017-09-26: 09:00:00 via INTRAVENOUS

## 2017-09-26 NOTE — Plan of Care (Signed)
Pt maintains oxygen saturation levels above 95% on room air. No c/o shortness of breath or signs of distress 

## 2017-09-26 NOTE — Progress Notes (Signed)
PROGRESS NOTE    Regina Ortiz  URK:[id number] DOB: Jan 16, 1946 DOA: 09/23/2017 PCP: Maurice Small, MD   Chief Complaint  Patient presents with  . Chest Pain    Brief Narrative:  HPI On 09/24/2017 by Dr. Janetta Hora is a 72 y.o. female with medical history significant of hypertension, hyperlipidemia, COPD, GERD, depression, anxiety, rheumatoid arthritis, left bundle blockage, chronic hyponatremia, tobacco abuse, alcohol use, who presents with chest pain and headache.  Patient states that she has been having chest pain in the past 2 days. The chest pain is located in the substernal area, sharp and also pressure-like pain, constant, radiating to left arm and jaw. She also has frontal chest wall tenderness which is aggravated by deep breath. It is associated with mild SOB and lightheadedness. Patient also reports severe headache on the top of her head. No neck pain or rigidity. It is associated with dizziness. Denies unilateral weakness, numbness or tingling of the extremities. No vision change or hearing loss. Patient does not have nausea, vomiting, diarrhea, abdominal pain, symptoms of UTI. Patient states that she drinks 2-3 beers every day. Patient states that she has a lot of stress at home due to recent husband suicide. Initially patient had elevated blood pressure 203/74, which improved to 169/82 currently.  Assessment & Plan   Chest pain -Troponins cycled and unremarkable -Cardiology consultation appreciated  -Nuclear stress test 09/24/2017 unremarkable -Echocardiogram showed EF of 83-15%, grade 1 diastolic dysfunction -Suspect chest pain is likely secondary to stress since patient's husband committed suicide on 08/01/2017 -Per cardiology to further workup -continue aspirin  Severe right carotid disease -Patient with bilateral carotid bruits, left greater than right -Carotid ultrasound in 2017 showed 1-39% R ICA stenosis, 17-61% LICA stenosis -CTA: Moderate to advanced  atheromatous plaque throughout the carotid siphons with resultant severe near occlusive cavernous right ICA stenosis.  -Carotid doppler pending -vascular surgery consultation appreciated, recommended outpatient follow-up  Hypertensive urgency -BP improved, continue diltiazem, hydralazine  Acute kidney injury -Resolved, continue to monitor BMP  Hyponatremia, chronic -Sodium 123, has been low since 2016 -will place on IVF and monitor BMP -encouraged PO intake/ change diet to regular  Hypokalemia -Replacing continue to monitor BMP  Depression/anxiety -Continue BuSpar, Prozac  Rheumatoid arthritis -Continue home dose of prednisone  Tobacco abuse/alcohol abuse -Discuss cessation, continue nicotine patch  Deconditioning and weakness -PT consulted and recommended HH  DVT Prophylaxis  lovenox  Code Status: Full  Family Communication: None at bedside  Disposition Plan: Admitted, plan for discharge on 1/27  Consultants Cardiology  Procedures  Stress-lexiscan  Echocardiogram  Antibiotics   Anti-infectives (From admission, onward)   None      Subjective:   Regina Ortiz seen and examined today.  Denies further chest pain. States she did not sleep well last night due to crying. Denies shortness of breath, bone pain, nausea vomiting, diarrhea or constipation.  Objective:   Vitals:   09/25/17 0500 09/25/17 1315 09/25/17 1900 09/26/17 0400  BP: (!) 173/64 (!) 166/68 (!) 147/63 (!) 164/70  Pulse: 79 81 87 84  Resp: 16  19   Temp: 100.2 F (37.9 C) 98 F (36.7 C) 97.7 F (36.5 C) 98.4 F (36.9 C)  TempSrc: Oral Oral Oral Oral  SpO2: 97% 98% 98% 97%  Weight:    49.2 kg (108 lb 6.4 oz)  Height:        Intake/Output Summary (Last 24 hours) at 09/26/2017 1330 Last data filed at 09/25/2017 2000 Gross per 24 hour  Intake 360 ml  Output -  Net 360 ml   Filed Weights   09/24/17 0137 09/24/17 0459 09/26/17 0400  Weight: 46.7 kg (103 lb) 45.5 kg (100 lb 3.2 oz) 49.2  kg (108 lb 6.4 oz)    Exam  General: Well developed, thin, elderly  HEENT: NCAT, mucous membranes moist.   Cardiovascular: S1 S2 auscultated, RRR, no murmurs  Respiratory: Clear to auscultation bilaterally with equal chest rise  Abdomen: Soft, nontender, nondistended, + bowel sounds  Extremities: warm dry without cyanosis clubbing or edema  Neuro: AAOx3, nonfocal  Psych: Depressed and tearful   Data Reviewed: I have personally reviewed following labs and imaging studies  CBC: Recent Labs  Lab 09/23/17 1856 09/24/17 1507  WBC 7.9 8.0  HGB 12.0 10.9*  HCT 33.2* 30.5*  MCV 101.5* 102.0*  PLT 195 941   Basic Metabolic Panel: Recent Labs  Lab 09/23/17 1856 09/24/17 1507 09/26/17 0448 09/26/17 1156  NA 125* 126* 123* 123*  K 3.8 3.0* 4.3  --   CL 91* 94* 97*  --   CO2 20* 22 19*  --   GLUCOSE 112* 102* 106*  --   BUN 9 7 9   --   CREATININE 1.22* 0.95 0.97  --   CALCIUM 9.7 8.3* 8.3*  --    GFR: Estimated Creatinine Clearance: 40.7 mL/min (by C-G formula based on SCr of 0.97 mg/dL). Liver Function Tests: No results for input(s): AST, ALT, ALKPHOS, BILITOT, PROT, ALBUMIN in the last 168 hours. No results for input(s): LIPASE, AMYLASE in the last 168 hours. No results for input(s): AMMONIA in the last 168 hours. Coagulation Profile: No results for input(s): INR, PROTIME in the last 168 hours. Cardiac Enzymes: Recent Labs  Lab 09/24/17 0343 09/24/17 0857 09/24/17 1507  TROPONINI <0.03 <0.03 <0.03   BNP (last 3 results) No results for input(s): PROBNP in the last 8760 hours. HbA1C: Recent Labs    09/24/17 0343  HGBA1C 5.4   CBG: No results for input(s): GLUCAP in the last 168 hours. Lipid Profile: Recent Labs    09/24/17 0343  CHOL 143  HDL 96  LDLCALC 42  TRIG 24  CHOLHDL 1.5   Thyroid Function Tests: Recent Labs    09/24/17 0343  TSH 3.597   Anemia Panel: No results for input(s): VITAMINB12, FOLATE, FERRITIN, TIBC, IRON, RETICCTPCT  in the last 72 hours. Urine analysis:    Component Value Date/Time   COLORURINE YELLOW 09/22/2012 1100   APPEARANCEUR CLOUDY (A) 09/22/2012 1100   LABSPEC 1.009 09/22/2012 1100   PHURINE 6.5 09/22/2012 1100   GLUCOSEU NEGATIVE 09/22/2012 1100   HGBUR TRACE (A) 09/22/2012 1100   BILIRUBINUR NEGATIVE 09/22/2012 1100   KETONESUR NEGATIVE 09/22/2012 1100   PROTEINUR NEGATIVE 09/22/2012 1100   UROBILINOGEN 0.2 09/22/2012 1100   NITRITE POSITIVE (A) 09/22/2012 1100   LEUKOCYTESUR MODERATE (A) 09/22/2012 1100   Sepsis Labs: @LABRCNTIP (procalcitonin:4,lacticidven:4)  )No results found for this or any previous visit (from the past 240 hour(s)).    Radiology Studies: No results found.   Scheduled Meds: . aspirin EC  81 mg Oral Daily  . atorvastatin  40 mg Oral Daily  . B-complex with vitamin C  1 tablet Oral Daily  . buPROPion  150 mg Oral Daily  . busPIRone  30 mg Oral BID  . calcium-vitamin D  1 tablet Oral Q breakfast  . cholecalciferol  1,000 Units Oral Daily  . cycloSPORINE  1 drop Both Eyes BID  . diltiazem  360  mg Oral Daily  . enoxaparin (LOVENOX) injection  40 mg Subcutaneous Daily  . feeding supplement  1 Container Oral TID BM  . folic acid  1 mg Oral Daily  . hydrALAZINE  50 mg Oral Q8H  . LORazepam  0-4 mg Intravenous Q12H  . nicotine  21 mg Transdermal Daily  . pantoprazole  40 mg Oral Daily  . predniSONE  5 mg Oral Q breakfast  . thiamine  100 mg Oral Daily   Or  . thiamine  100 mg Intravenous Daily   Continuous Infusions: . sodium chloride 75 mL/hr at 09/26/17 0907     LOS: 1 day   Time Spent in minutes   30 minutes  Kiaria Quinnell D.O. on 09/26/2017 at 1:30 PM  Between 7am to 7pm - Pager - [phone]  After 7pm go to [email or web address] - password [email or web address]  And look for the night coverage person covering for me after hours  [facility] Office  [phone]

## 2017-09-27 DIAGNOSIS — E871 Hypo-osmolality and hyponatremia: Secondary | ICD-10-CM

## 2017-09-27 DIAGNOSIS — R42 Dizziness and giddiness: Secondary | ICD-10-CM

## 2017-09-27 DIAGNOSIS — I161 Hypertensive emergency: Secondary | ICD-10-CM

## 2017-09-27 LAB — BASIC METABOLIC PANEL
ANION GAP: 7 (ref 5–15)
BUN: 7 mg/dL (ref 6–20)
CHLORIDE: 99 mmol/L — AB (ref 101–111)
CO2: 19 mmol/L — AB (ref 22–32)
Calcium: 8.2 mg/dL — ABNORMAL LOW (ref 8.9–10.3)
Creatinine, Ser: 1.01 mg/dL — ABNORMAL HIGH (ref 0.44–1.00)
GFR calc Af Amer: >60 mL/min (ref >60)
GFR calc non Af Amer: 54 mL/min — ABNORMAL LOW (ref >60)
GLUCOSE: 123 mg/dL — AB (ref 65–99)
Potassium: 3.5 mmol/L (ref 3.5–5.1)
Sodium: 125 mmol/L — ABNORMAL LOW (ref 135–145)

## 2017-09-27 MED ORDER — HYDRALAZINE HCL 50 MG PO TABS: 50.0000 mg | ORAL_TABLET | Freq: Three times a day (TID) | ORAL | 0 refills | Status: DC

## 2017-09-27 MED ORDER — ZOLPIDEM TARTRATE 10 MG PO TABS: 5.0000 mg | ORAL_TABLET | Freq: Every evening | ORAL | 0 refills | Status: DC | PRN

## 2017-09-27 MED ORDER — FOLIC ACID 1 MG PO TABS: 1.0000 mg | ORAL_TABLET | Freq: Every day | ORAL | 0 refills | Status: DC

## 2017-09-27 MED ORDER — NICOTINE 21 MG/24HR TD PT24: 21.0000 mg | MEDICATED_PATCH | Freq: Every day | TRANSDERMAL | 0 refills | Status: DC

## 2017-09-27 MED ORDER — THIAMINE HCL 100 MG PO TABS: 100.0000 mg | ORAL_TABLET | Freq: Every day | ORAL | 0 refills | Status: DC

## 2017-09-27 MED ORDER — ASPIRIN 81 MG PO TBEC: 81.0000 mg | DELAYED_RELEASE_TABLET | Freq: Every day | ORAL | 0 refills | Status: DC

## 2017-09-27 NOTE — Care Management Note (Signed)
Case Management Note Previous CM note completed by Bethena Roys, RN 09/25/2017, 2:03 PM   Patient Details  Name: Regina Ortiz MRN: [id number] Date of Birth: 08-15-1946  Subjective/Objective: Pt presented for Hypertensive Urgency and Chest Pain. Per MD notes pt's husband committed suicide that pt witnessed. Per MD- pt has declined psych consult and states that she has grief counseling set up via [facility]. CSW to follow for additional psychosocial needs.                    Action/Plan: No further needs from CM at this time.   Expected Discharge Date:  09/26/17               Expected Discharge Plan:  Home/Self Care  In-House Referral:  Clinical Social Work  Discharge planning Services  CM Consult  Post Acute Care Choice:  Home Health Choice offered to:  Patient  DME Arranged:  N/A DME Agency:  NA  HH Arranged:  PT [place] Agency:  [place]  Status of Service:  Completed, signed off  If discussed at [place] of Stay Meetings, dates discussed:    Discharge Disposition: home/home health   Additional Comments:  09/27/17- 1220- Tsuneo Faison RN, CM- pt for d/c home today- order placed for HHPT per PT recommendations- spoke with pt at bedside- choice offered for [facility] agency- per pt she would like to use [facility] for services, pt states she uses cane at home- no other DME needs noted- pt reports that she has an appointment with Hospice for Feb. 12. - Referral called to [facility] with [facility] for HHPT.   Dahlia Client [place], RN 09/27/2017, 12:27 PM [phone] 6E weekend coverage

## 2017-09-27 NOTE — Discharge Instructions (Signed)

## 2017-09-28 ENCOUNTER — Telehealth: Payer: Self-pay | Admitting: Vascular Surgery

## 2017-09-28 NOTE — Telephone Encounter (Signed)
Put in recall

## 2017-09-28 NOTE — Telephone Encounter (Signed)
-----   Message from Mena Goes, RN sent at 09/28/2017  9:49 AM EST ----- Regarding: FW: f/u 1 year   ----- Message ----- From: Angelia Mould, MD Sent: 09/28/2017   9:15 AM To: [facility] Charge Pool Subject: f/u                                            This patient needs a follow-up visit in 1 year with a carotid duplex at that time.  Thank you.  She has been discharged from the hospital.

## 2017-09-29 ENCOUNTER — Other Ambulatory Visit: Payer: Self-pay

## 2017-09-29 DIAGNOSIS — Z5181 Encounter for therapeutic drug level monitoring: Secondary | ICD-10-CM | POA: Diagnosis not present

## 2017-09-29 DIAGNOSIS — Z09 Encounter for follow-up examination after completed treatment for conditions other than malignant neoplasm: Secondary | ICD-10-CM | POA: Diagnosis not present

## 2017-09-29 DIAGNOSIS — M940 Chondrocostal junction syndrome [Tietze]: Secondary | ICD-10-CM | POA: Diagnosis not present

## 2017-09-29 DIAGNOSIS — I6529 Occlusion and stenosis of unspecified carotid artery: Secondary | ICD-10-CM

## 2017-09-29 NOTE — Consult Note (Signed)
            [facility] CM Primary Care Navigator  09/29/2017  Regina Ortiz February 25, 1946 [id number]   Went to seepatient at the bedsideto identify possible discharge needs but she was alreadydischarged.  Per MD note, patient presented with chest pain (likely secondary to stress) and headache.  Patient was discharged homewith home health services (PT) related to weakness and deconditioning.  Primary care provider's officeis listed asprovidingtransition of care (TOC).  Patient has discharge instruction to follow-up withprimary care provider with in 1 week of discharge and follow-up with vascular surgeon as outpatient.   For additional questions please contact:  Edwena Felty A. Damieon Armendariz, BSN, RN-BC [facility] PRIMARY CARE Navigator Cell: [phone]

## 2017-09-30 DIAGNOSIS — F1010 Alcohol abuse, uncomplicated: Secondary | ICD-10-CM | POA: Diagnosis not present

## 2017-09-30 DIAGNOSIS — M069 Rheumatoid arthritis, unspecified: Secondary | ICD-10-CM | POA: Diagnosis not present

## 2017-09-30 DIAGNOSIS — M6281 Muscle weakness (generalized): Secondary | ICD-10-CM | POA: Diagnosis not present

## 2017-09-30 DIAGNOSIS — F17210 Nicotine dependence, cigarettes, uncomplicated: Secondary | ICD-10-CM | POA: Diagnosis not present

## 2017-09-30 DIAGNOSIS — E871 Hypo-osmolality and hyponatremia: Secondary | ICD-10-CM | POA: Diagnosis not present

## 2017-09-30 DIAGNOSIS — I6521 Occlusion and stenosis of right carotid artery: Secondary | ICD-10-CM | POA: Diagnosis not present

## 2017-09-30 DIAGNOSIS — F419 Anxiety disorder, unspecified: Secondary | ICD-10-CM | POA: Diagnosis not present

## 2017-09-30 DIAGNOSIS — F329 Major depressive disorder, single episode, unspecified: Secondary | ICD-10-CM | POA: Diagnosis not present

## 2017-09-30 DIAGNOSIS — Z7982 Long term (current) use of aspirin: Secondary | ICD-10-CM | POA: Diagnosis not present

## 2017-09-30 DIAGNOSIS — I10 Essential (primary) hypertension: Secondary | ICD-10-CM | POA: Diagnosis not present

## 2017-10-01 ENCOUNTER — Ambulatory Visit: Payer: PPO | Admitting: Cardiology

## 2017-10-01 ENCOUNTER — Other Ambulatory Visit: Payer: Self-pay

## 2017-10-01 ENCOUNTER — Encounter: Payer: Self-pay | Admitting: Cardiology

## 2017-10-01 VITALS — BP 170/54 | HR 97 | Ht 64.0 in | Wt 103.8 lb

## 2017-10-01 DIAGNOSIS — R0989 Other specified symptoms and signs involving the circulatory and respiratory systems: Secondary | ICD-10-CM | POA: Diagnosis not present

## 2017-10-01 DIAGNOSIS — Z720 Tobacco use: Secondary | ICD-10-CM

## 2017-10-01 DIAGNOSIS — I447 Left bundle-branch block, unspecified: Secondary | ICD-10-CM

## 2017-10-01 DIAGNOSIS — I10 Essential (primary) hypertension: Secondary | ICD-10-CM

## 2017-10-01 DIAGNOSIS — R0789 Other chest pain: Secondary | ICD-10-CM

## 2017-10-01 NOTE — Patient Instructions (Signed)
Medication Instructions:  The current medical regimen is effective;  continue present plan and medications.  Follow-Up: Follow up in 3 months with Dr Skains.  If you need a refill on your cardiac medications before your next appointment, please call your pharmacy.  Thank you for choosing [facility]!!     

## 2017-10-01 NOTE — Progress Notes (Signed)
Cardiology Office Note    Date:  10/01/2017   ID:  Regina, Ortiz Jun 02, 1946, MRN [id number]  PCP:  Maurice Small, MD  Cardiologist:   Candee Furbish, MD     History of Present Illness:  Regina Ortiz is a 72 y.o. female with left bundle branch block here for follow-up. New  Previously had a low risk nuclear stress test and echocardiogram which was normal. Previously hyponatremia with over drinking of water.  She's gone through a rough time recently with her husband who had a major depressive disorder, breakdown. She was in and out of [employer], [employer]. Struggling this.  Continues to smoke. No significant chest pain. Rare palpitations. Mild ankle edema noted. No strokelike symptoms.  She notes bruising that has been fairly easy. Aspirin 81. We have stopped. She takes Humira for rheumatoid arthritis and is under good control.  Had shingles and the flu.   10/01/17-she was recently in the hospital, discharged on 09/27/17 with chest pain.  She had chest pain over 2 days, pressure-like, jaw pain.  She drinks 2-3 beers a day.  Lots of stress at home because of her husband's recent suicide.  Troponins were normal.  Cardiology was consulted, nuclear stress test was low risk.  Likely secondary to stress.  Continuing aspirin. She is shaky. Anxious.    Past Medical History:  Diagnosis Date  . AIN grade I   . Anxiety   . Bruises easily   . Chronic diarrhea   . Chronic hyponatremia   . Colitis   . COPD (chronic obstructive pulmonary disease) ([place])   . Depression   . GERD (gastroesophageal reflux disease)   . History of chronic bronchitis   . History of vulvar dysplasia    serveral recurrency's   . Hypertension   . LBBB (left bundle branch block) W/ PROLONGED PR   CARDIOLOGSIT-  DR Johnsie Cancel-   LOV IN EPIC  . Macrocytosis   . Nocturia   . Osteoporosis   . Seronegative rheumatoid arthritis ([place])   . Short of breath on exertion   . Smokers' cough ([place])   . Venous  insufficiency of leg    , Edema  . VIN III (vulvar intraepithelial neoplasia III)    RECURRENT -    Past Surgical History:  Procedure Laterality Date  . APPENDECTOMY  1992  . CARDIOVASCULAR STRESS TEST  06-23-2012  DR Jenkins [place]   NORMAL LEXISCAN MYOVIEW/ EF 83%/  NO ISCHEMIA  . CO2 LASER APPLICATION  06/28/2535   Procedure: CO2 LASER APPLICATION;  Surgeon: Janie Morning, MD PHD;  Location: [facility];  Service: Gynecology;  Laterality: N/A;  Laser Vaporization  . CO2 LASER APPLICATION N/A 02/02/4033   Procedure: CO2 LASER APPLICATION;  Surgeon: [name], MD;  Location: [facility];  Service: Gynecology;  Laterality: N/A;  . CO2 LASER APPLICATION N/A 7/42/5956   Procedure: CO2 LASER APPLICATION of the vulva;  Surgeon: Janie Morning, MD;  Location: [facility];  Service: Gynecology;  Laterality: N/A;  . EXCISION VULVAR LESIONS  07/2012  . HEMORRHOID SURGERY N/A 04/15/2013   Procedure: Excision of external and internal  hemorrhoid with AIN, Exam under anesthesia;  Surgeon: Odis Hollingshead, MD;  Location: [facility];  Service: General;  Laterality: N/A;  . REPAIR FISTULA IN ANO/  I & D PERIRECTAL ABSCESS  10-26-2002  DR Johnathan Hausen  . TRANSTHORACIC ECHOCARDIOGRAM  06-23-2012   NORMAL LVSF/ EF 55-60%  . VAGINAL HYSTERECTOMY  1997   partial  . VULVAR LESION REMOVAL N/A 11/03/2014   Procedure: CO2 LASER OF VULVAR ;  Surgeon: [name], MD;  Location: [facility];  Service: Gynecology;  Laterality: N/A;  . VULVECTOMY N/A 03/26/2015   Procedure: WIDE LOCA  EXCISION OF VULVAR;  Surgeon: [name], MD;  Location: [facility];  Service: Gynecology;  Laterality: N/A;  . Wide Local Excision of labia majora  04-04-2010   Right-sided lesion, CO2 ablation of right labia minora    Outpatient Medications Prior to Visit  Medication Sig Dispense Refill  . aspirin 81 MG EC tablet Take 1 tablet (81 mg total) by mouth daily.  30 tablet 0  . atorvastatin (LIPITOR) 40 MG tablet TAKE ONE TABLET BY MOUTH ONCE DAILY (Patient taking differently: TAKE ONE TABLET( 40mg ) BY MOUTH ONCE DAILY) 90 tablet 0  . b complex vitamins capsule Take 1 capsule by mouth daily.    Marland Kitchen BIOTIN PO Take 5,000 mg by mouth 2 (two) times daily.    Marland Kitchen buPROPion (WELLBUTRIN SR) 150 MG 12 hr tablet Take 150 mg by mouth daily.     . busPIRone (BUSPAR) 30 MG tablet Take 30 mg by mouth 2 (two) times daily.     . Calcium Carbonate-Vitamin D (CALTRATE 600+D PO) Take 1 tablet by mouth daily.     . Cholecalciferol (VITAMIN D3) 1000 UNITS CAPS Take 1,000 Units by mouth daily.     . cycloSPORINE (RESTASIS) 0.05 % ophthalmic emulsion Place 1 drop into both eyes 2 (two) times daily.    Marland Kitchen diltiazem (TIAZAC) 360 MG 24 hr capsule Take 360 mg by mouth daily.     Marland Kitchen FLUoxetine (PROZAC) 20 MG tablet Take 10-20 mg by mouth See admin instructions. Take 1/2 tablet daily for 7 days then increase to 1 tablet daily    . folic acid (FOLVITE) 1 MG tablet Take 1 tablet (1 mg total) by mouth daily. 30 tablet 0  . hydrALAZINE (APRESOLINE) 50 MG tablet Take 1 tablet (50 mg total) by mouth every 8 (eight) hours. 90 tablet 0  . Lysine 500 MG TABS Take 500 mg by mouth 2 (two) times daily.     . nicotine (NICODERM CQ - DOSED IN MG/24 HOURS) 21 mg/24hr patch Place 1 patch (21 mg total) onto the skin daily. 28 patch 0  . omeprazole (PRILOSEC) 20 MG capsule Take 20 mg by mouth every other day.     . Potassium (POTASSIMIN PO) Take 1 tablet by mouth every evening.     . predniSONE (DELTASONE) 5 MG tablet Take 5 mg by mouth daily with breakfast. --  LONG-TERM USE FOR RA    . PRESCRIPTION MEDICATION Take 1 tablet by mouth at bedtime. New Medication for Sleep    . thiamine 100 MG tablet Take 1 tablet (100 mg total) by mouth daily. 30 tablet 0  . zolpidem (AMBIEN) 10 MG tablet Take 0.5 tablets (5 mg total) by mouth at bedtime as needed for sleep. 30 tablet 0   No facility-administered  medications prior to visit.      Allergies:   Latex; Doxycycline; Lactose intolerance (gi); Lialda [mesalamine]; Prinivil [lisinopril]; Triamcinolone acetonide; Amlodipine; and Lotensin [benazepril hcl]   Social History   Socioeconomic History  . Marital status: Married    Spouse name: None  . Number of children: None  . Years of education: None  . Highest education level: None  Social Needs  . Financial resource strain: None  . Food  insecurity - worry: None  . Food insecurity - inability: None  . Transportation needs - medical: None  . Transportation needs - non-medical: None  Occupational History  . None  Tobacco Use  . Smoking status: Current Every Day Smoker    Packs/day: 0.25    Years: 50.00    Pack years: 12.50    Types: Cigarettes  . Smokeless tobacco: Never Used  Substance and Sexual Activity  . Alcohol use: Yes    Alcohol/week: 1.2 oz    Types: 2 Cans of beer per week  . Drug use: No  . Sexual activity: Yes  Other Topics Concern  . None  Social History Narrative  . None     Family History:  The patient's family history includes Cancer in her son; Diabetes in her mother; Hypertension in her father; Lymphoma in her son.   ROS:   Please see the history of present illness.    ROS  No syncope, no bleeding, no orthopnea, no PND. All other systems reviewed and are negative.   PHYSICAL EXAM:   VS:  BP (!) 170/54   Pulse 97   Ht 5\' 4"  (1.626 m)   Wt 103 lb 12.8 oz (47.1 kg)   LMP  (LMP Unknown)   SpO2 97%   BMI 17.82 kg/m    GEN: Well nourished, well developed, in no acute distress  HEENT: normal  Neck: no JVD, + bilateral carotid bruits, or masses Cardiac: RRR; no murmurs, rubs, or gallops,no edema  Respiratory:  clear to auscultation bilaterally, normal work of breathing GI: soft, nontender, nondistended, + BS MS: no deformity or atrophy rheumatic changes knuckles in hand Skin: warm and dry, no rash Neuro:  Alert and Oriented x 3, Strength and  sensation are intact Psych: euthymic mood, full affect  Wt Readings from Last 3 Encounters:  10/01/17 103 lb 12.8 oz (47.1 kg)  09/27/17 106 lb 6.4 oz (48.3 kg)  11/18/16 109 lb (49.4 kg)      Studies/Labs Reviewed:   EKG:  EKG is ordered today.  The ekg ordered today demonstrates 10/01/17-sinus rhythm heart rate 93 with PACs, bigeminal pattern.  Personally reviewed.  09/30/16-sinus rhythm 76 left bundle branch block personally viewed-prior 09/19/15-sinus rhythm, 86, left bundle branch block  Recent Labs: 11/25/2016: ALT 28 09/24/2017: Hemoglobin 10.9; Platelets 180; TSH 3.597 09/27/2017: BUN 7; Creatinine, Ser 1.01; Potassium 3.5; Sodium 125   Lipid Panel    Component Value Date/Time   CHOL 143 09/24/2017 0343   CHOL 150 11/25/2016 0920   TRIG 24 09/24/2017 0343   HDL 96 09/24/2017 0343   HDL 105 11/25/2016 0920   CHOLHDL 1.5 09/24/2017 0343   VLDL 5 09/24/2017 0343   LDLCALC 42 09/24/2017 0343   LDLCALC 40 11/25/2016 0920    Additional studies/ records that were reviewed today include:   Echo: 06/23/12: - Left ventricle: The cavity size was normal. Systolic function was normal. The estimated ejection fraction was in the range of 55% to 60%. Wall motion was normal; there were no regional wall motion abnormalities. - Atrial septum: No defect or patent foramen ovale was identified.  NUC stress 06/23/12: -Normal Lexiscan myovue EF 83%   ASSESSMENT:    1. Atypical chest pain   2. Essential hypertension   3. Bilateral carotid bruits   4. LBBB (left bundle branch block)   5. Tobacco use      PLAN:  In order of problems listed above:  1. Carotid artery diesease - moderate, repeating  in one year. Atorvastatin 40 mg once a day.  2. Tobacco use - Continue to advocate smoking cessation 3. Left bundle branch block is chronic. No high-risk symptoms such as syncope. No chest pain. 4. PAC - benign.  5. Hyponatremia - chronic 6. Chest pain - likely stress related  from husbands suicide. NUC, echo ok. Continue to work through her stress.   3 month follow up  Medication Adjustments/Labs and Tests Ordered: Current medicines are reviewed at length with the patient today.  Concerns regarding medicines are outlined above.  Medication changes, Labs and Tests ordered today are listed in the Patient Instructions below. Patient Instructions  Medication Instructions:  The current medical regimen is effective;  continue present plan and medications.  Follow-Up: Follow up in 3 months with Dr Marlou Porch.  If you need a refill on your cardiac medications before your next appointment, please call your pharmacy.  Thank you for choosing [facility]!!          Signed, Candee Furbish, MD  10/01/2017 3:23 PM    [address], [place], [place]  [address] Phone: [phone]; Fax: [phone]

## 2017-10-05 DIAGNOSIS — F411 Generalized anxiety disorder: Secondary | ICD-10-CM | POA: Diagnosis not present

## 2017-10-05 DIAGNOSIS — M069 Rheumatoid arthritis, unspecified: Secondary | ICD-10-CM | POA: Diagnosis not present

## 2017-10-05 DIAGNOSIS — G4700 Insomnia, unspecified: Secondary | ICD-10-CM | POA: Diagnosis not present

## 2017-10-05 DIAGNOSIS — R634 Abnormal weight loss: Secondary | ICD-10-CM | POA: Diagnosis not present

## 2017-10-05 DIAGNOSIS — E538 Deficiency of other specified B group vitamins: Secondary | ICD-10-CM | POA: Diagnosis not present

## 2017-10-15 DIAGNOSIS — F4321 Adjustment disorder with depressed mood: Secondary | ICD-10-CM | POA: Diagnosis not present

## 2017-10-15 DIAGNOSIS — I7300 Raynaud's syndrome without gangrene: Secondary | ICD-10-CM | POA: Diagnosis not present

## 2017-10-15 DIAGNOSIS — K5289 Other specified noninfective gastroenteritis and colitis: Secondary | ICD-10-CM | POA: Diagnosis not present

## 2017-10-15 DIAGNOSIS — M069 Rheumatoid arthritis, unspecified: Secondary | ICD-10-CM | POA: Diagnosis not present

## 2017-10-15 DIAGNOSIS — A0472 Enterocolitis due to Clostridium difficile, not specified as recurrent: Secondary | ICD-10-CM | POA: Diagnosis not present

## 2017-10-19 DIAGNOSIS — I10 Essential (primary) hypertension: Secondary | ICD-10-CM | POA: Diagnosis not present

## 2017-10-19 DIAGNOSIS — R0602 Shortness of breath: Secondary | ICD-10-CM | POA: Diagnosis not present

## 2017-10-26 DIAGNOSIS — E871 Hypo-osmolality and hyponatremia: Secondary | ICD-10-CM | POA: Diagnosis not present

## 2017-10-27 ENCOUNTER — Other Ambulatory Visit: Payer: Self-pay | Admitting: Family Medicine

## 2017-10-27 DIAGNOSIS — E871 Hypo-osmolality and hyponatremia: Secondary | ICD-10-CM

## 2017-11-04 ENCOUNTER — Ambulatory Visit
Admission: RE | Admit: 2017-11-04 | Discharge: 2017-11-04 | Disposition: A | Discharge status: Home / Self Care | Payer: PPO | Source: Ambulatory Visit | Attending: Family Medicine | Admitting: Family Medicine

## 2017-11-04 DIAGNOSIS — E871 Hypo-osmolality and hyponatremia: Secondary | ICD-10-CM

## 2017-11-24 ENCOUNTER — Other Ambulatory Visit (HOSPITAL_COMMUNITY)
Admission: RE | Admit: 2017-11-24 | Discharge: 2017-11-24 | Disposition: A | Discharge status: Home / Self Care | Payer: PPO | Source: Ambulatory Visit | Attending: Gynecologic Oncology | Admitting: Gynecologic Oncology

## 2017-11-24 ENCOUNTER — Telehealth: Payer: Self-pay | Admitting: *Deleted

## 2017-11-24 ENCOUNTER — Inpatient Hospital Stay: Payer: PPO | Attending: Gynecologic Oncology | Admitting: Gynecologic Oncology

## 2017-11-24 ENCOUNTER — Encounter: Payer: Self-pay | Admitting: Gynecologic Oncology

## 2017-11-24 VITALS — BP 151/45 | HR 93 | Temp 97.8°F | Resp 18 | Ht 63.5 in | Wt 104.1 lb

## 2017-11-24 DIAGNOSIS — M069 Rheumatoid arthritis, unspecified: Secondary | ICD-10-CM | POA: Diagnosis not present

## 2017-11-24 DIAGNOSIS — Z79899 Other long term (current) drug therapy: Secondary | ICD-10-CM

## 2017-11-24 DIAGNOSIS — D071 Carcinoma in situ of vulva: Secondary | ICD-10-CM | POA: Diagnosis not present

## 2017-11-24 DIAGNOSIS — Z01419 Encounter for gynecological examination (general) (routine) without abnormal findings: Secondary | ICD-10-CM | POA: Diagnosis not present
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
## 2017-11-24 NOTE — Telephone Encounter (Signed)
Returned the patient's phone call and left a message confirming the appt for today

## 2017-11-24 NOTE — Patient Instructions (Signed)
Plan on following up in one year or sooner if needed.  Please call our office in December 2019 or January 2020 to schedule for March 2020.  We will contact you with the results of your pap smear.  Please call our office for any questions or concerns or new symptoms.

## 2017-11-26 ENCOUNTER — Telehealth: Payer: Self-pay

## 2017-11-26 LAB — CYTOLOGY - PAP
DIAGNOSIS: NEGATIVE
HPV: NOT DETECTED

## 2017-11-26 NOTE — Telephone Encounter (Signed)
Per Joylene John NP, "pap results are normal and HPV high risk is negative". Notified pt via phone and no needs per pt at this time.

## 2017-11-30 DIAGNOSIS — M2550 Pain in unspecified joint: Secondary | ICD-10-CM | POA: Diagnosis not present

## 2017-11-30 DIAGNOSIS — M0609 Rheumatoid arthritis without rheumatoid factor, multiple sites: Secondary | ICD-10-CM | POA: Diagnosis not present

## 2017-11-30 DIAGNOSIS — K5289 Other specified noninfective gastroenteritis and colitis: Secondary | ICD-10-CM | POA: Diagnosis not present

## 2017-11-30 DIAGNOSIS — R636 Underweight: Secondary | ICD-10-CM | POA: Diagnosis not present

## 2017-11-30 DIAGNOSIS — Z79899 Other long term (current) drug therapy: Secondary | ICD-10-CM | POA: Diagnosis not present

## 2017-11-30 DIAGNOSIS — Z681 Body mass index (BMI) 19 or less, adult: Secondary | ICD-10-CM | POA: Diagnosis not present

## 2017-12-14 ENCOUNTER — Encounter: Payer: Self-pay | Admitting: Cardiology

## 2017-12-14 ENCOUNTER — Ambulatory Visit: Payer: PPO | Admitting: Cardiology

## 2017-12-14 VITALS — BP 128/74 | HR 80 | Ht 64.0 in | Wt 103.0 lb

## 2017-12-14 DIAGNOSIS — Z720 Tobacco use: Secondary | ICD-10-CM

## 2017-12-14 DIAGNOSIS — R0989 Other specified symptoms and signs involving the circulatory and respiratory systems: Secondary | ICD-10-CM | POA: Diagnosis not present

## 2017-12-14 DIAGNOSIS — I447 Left bundle-branch block, unspecified: Secondary | ICD-10-CM

## 2017-12-14 NOTE — Progress Notes (Signed)
Cardiology Office Note    Date:  12/14/2017   ID:  [name], [place] [birth year], MRN [id number]  PCP:  Maurice Small, MD  Cardiologist:   Candee Furbish, MD     History of Present Illness:  Regina Ortiz is a 72 y.o. female with left bundle branch block here for follow-up. New  Previously had a low risk nuclear stress test and echocardiogram which was normal. Previously hyponatremia with over drinking of water.  She's gone through a rough time recently with her husband who had a major depressive disorder, breakdown.Suicide She was in and out of [employer], [employer]. Struggling this.  Continues to smoke. No significant chest pain. Rare palpitations. Mild ankle edema noted. No strokelike symptoms.  She notes bruising that has been fairly easy. Aspirin 81. We have stopped. She takes Humira for rheumatoid arthritis and is under good control.  Had shingles and the flu.   10/01/17-she was recently in the hospital, discharged on 09/27/17 with chest pain.  She had chest pain over 2 days, pressure-like, jaw pain.  She drinks 2-3 beers a day.  Lots of stress at home because of her husband's recent suicide.  Troponins were normal.  Cardiology was consulted, nuclear stress test was low risk.  Likely secondary to stress.  Continuing aspirin. She is shaky. Anxious.   12/14/17-feeling a bit better.  Seeing a Social worker.  Still working on smoking.  Still dealing significantly with her husband's suicide.   Past Medical History:  Diagnosis Date  . AIN grade I   . Anxiety   . Bruises easily   . Chronic diarrhea   . Chronic hyponatremia   . Colitis   . COPD (chronic obstructive pulmonary disease) ([place])   . Depression   . GERD (gastroesophageal reflux disease)   . History of chronic bronchitis   . History of vulvar dysplasia    serveral recurrency's   . Hypertension   . LBBB (left bundle branch block) W/ PROLONGED PR   CARDIOLOGSIT-  DR Johnsie Cancel-   LOV IN EPIC  . Macrocytosis   . Nocturia    . Osteoporosis   . Seronegative rheumatoid arthritis ([place])   . Short of breath on exertion   . Smokers' cough ([place])   . Venous insufficiency of leg    , Edema  . VIN III (vulvar intraepithelial neoplasia III)    RECURRENT -    Past Surgical History:  Procedure Laterality Date  . APPENDECTOMY  1992  . CARDIOVASCULAR STRESS TEST  06-23-2012  DR Jenkins Rouge   NORMAL LEXISCAN MYOVIEW/ EF 83%/  NO ISCHEMIA  . CO2 LASER APPLICATION  44/81/8563   Procedure: CO2 LASER APPLICATION;  Surgeon: Janie Morning, MD PHD;  Location: [facility];  Service: Gynecology;  Laterality: N/A;  Laser Vaporization  . CO2 LASER APPLICATION N/A 09/04/9700   Procedure: CO2 LASER APPLICATION;  Surgeon: [name], MD;  Location: [facility];  Service: Gynecology;  Laterality: N/A;  . CO2 LASER APPLICATION N/A 6/37/8588   Procedure: CO2 LASER APPLICATION of the vulva;  Surgeon: Janie Morning, MD;  Location: [facility];  Service: Gynecology;  Laterality: N/A;  . EXCISION VULVAR LESIONS  07/2012  . HEMORRHOID SURGERY N/A 04/15/2013   Procedure: Excision of external and internal  hemorrhoid with AIN, Exam under anesthesia;  Surgeon: Odis Hollingshead, MD;  Location: [facility];  Service: General;  Laterality: N/A;  . REPAIR FISTULA IN ANO/  I & D PERIRECTAL ABSCESS  10-26-2002  DR Johnathan Hausen  . TRANSTHORACIC ECHOCARDIOGRAM  06-23-2012   NORMAL LVSF/ EF 55-60%  . VAGINAL HYSTERECTOMY  1997   partial  . VULVAR LESION REMOVAL N/A 11/03/2014   Procedure: CO2 LASER OF VULVAR ;  Surgeon: [name], MD;  Location: [facility];  Service: Gynecology;  Laterality: N/A;  . VULVECTOMY N/A 03/26/2015   Procedure: WIDE LOCA  EXCISION OF VULVAR;  Surgeon: [name], MD;  Location: [place];  Service: Gynecology;  Laterality: N/A;  . Wide Local Excision of labia majora  04-04-2010   Right-sided lesion, CO2 ablation of right labia minora    Outpatient  Medications Prior to Visit  Medication Sig Dispense Refill  . ALPRAZolam (XANAX) 0.5 MG tablet Take 0.5 mg by mouth 2 (two) times daily as needed.     Marland Kitchen aspirin 81 MG EC tablet Take 1 tablet (81 mg total) by mouth daily. 30 tablet 0  . atorvastatin (LIPITOR) 40 MG tablet TAKE ONE TABLET BY MOUTH ONCE DAILY 90 tablet 0  . buPROPion (WELLBUTRIN XL) 300 MG 24 hr tablet Take 300 mg by mouth daily.     . busPIRone (BUSPAR) 30 MG tablet Take 30 mg by mouth 2 (two) times daily.     . Calcium Carbonate-Vitamin D (CALTRATE 600+D PO) Take 1 tablet by mouth daily.     . Cholecalciferol (VITAMIN D3) 1000 UNITS CAPS Take 1,000 Units by mouth daily.     . cycloSPORINE (RESTASIS) 0.05 % ophthalmic emulsion Place 1 drop into both eyes 2 (two) times daily.    Marland Kitchen diltiazem (TIAZAC) 360 MG 24 hr capsule Take 360 mg by mouth daily.     Marland Kitchen losartan (COZAAR) 100 MG tablet Take 100 mg by mouth daily.     Marland Kitchen omeprazole (PRILOSEC) 20 MG capsule Take 20 mg by mouth every other day.     . Potassium (POTASSIMIN PO) Take 1 tablet by mouth every evening.     . predniSONE (DELTASONE) 5 MG tablet Take 5 mg by mouth daily with breakfast. --  LONG-TERM USE FOR RA    . traZODone (DESYREL) 50 MG tablet Take 50 mg by mouth at bedtime.     Marland Kitchen b complex vitamins capsule Take 1 capsule by mouth daily.    Marland Kitchen BIOTIN PO Take 5,000 mg by mouth 2 (two) times daily.    Marland Kitchen FLUoxetine (PROZAC) 20 MG tablet Take 10-20 mg by mouth See admin instructions. Take 1/2 tablet daily for 7 days then increase to 1 tablet daily    . folic acid (FOLVITE) 1 MG tablet Take 1 tablet (1 mg total) by mouth daily. (Patient not taking: Reported on 12/14/2017) 30 tablet 0  . hydrALAZINE (APRESOLINE) 50 MG tablet Take 1 tablet (50 mg total) by mouth every 8 (eight) hours. (Patient not taking: Reported on 12/14/2017) 90 tablet 0  . Lysine 500 MG TABS Take 500 mg by mouth 2 (two) times daily.     . nicotine (NICODERM CQ - DOSED IN MG/24 HOURS) 21 mg/24hr patch Place 1  patch (21 mg total) onto the skin daily. (Patient not taking: Reported on 12/14/2017) 28 patch 0  . PRESCRIPTION MEDICATION Take 1 tablet by mouth at bedtime. New Medication for Sleep    . thiamine 100 MG tablet Take 1 tablet (100 mg total) by mouth daily. (Patient not taking: Reported on 12/14/2017) 30 tablet 0  . zolpidem (AMBIEN) 10 MG tablet Take 0.5 tablets (5 mg total) by mouth at  bedtime as needed for sleep. (Patient not taking: Reported on 12/14/2017) 30 tablet 0   No facility-administered medications prior to visit.      Allergies:   Latex; Doxycycline; Lactose intolerance (gi); Lialda [mesalamine]; Prinivil [lisinopril]; Triamcinolone acetonide; Amlodipine; and Lotensin [benazepril hcl]   Social History   Socioeconomic History  . Marital status: Widowed    Spouse name: Not on file  . Number of children: Not on file  . Years of education: Not on file  . Highest education level: Not on file  Occupational History  . Not on file  Social Needs  . Financial resource strain: Not on file  . Food insecurity:    Worry: Not on file    Inability: Not on file  . Transportation needs:    Medical: Not on file    Non-medical: Not on file  Tobacco Use  . Smoking status: Current Every Day Smoker    Packs/day: 0.25    Years: 50.00    Pack years: 12.50    Types: Cigarettes  . Smokeless tobacco: Never Used  Substance and Sexual Activity  . Alcohol use: Yes    Alcohol/week: 1.2 oz    Types: 2 Cans of beer per week  . Drug use: No  . Sexual activity: Yes  Lifestyle  . Physical activity:    Days per week: Not on file    Minutes per session: Not on file  . Stress: Not on file  Relationships  . Social connections:    Talks on phone: Not on file    Gets together: Not on file    Attends religious service: Not on file    Active member of club or organization: Not on file    Attends meetings of clubs or organizations: Not on file    Relationship status: Not on file  Other Topics  Concern  . Not on file  Social History Narrative  . Not on file     Family History:  The patient's family history includes Cancer in her son; Diabetes in her mother; Hypertension in her father; Lymphoma in her son.   ROS:   Please see the history of present illness.    Review of Systems  All other systems reviewed and are negative.      PHYSICAL EXAM:   VS:  BP 128/74   Pulse 80   Ht 5\' 4"  (1.626 m)   Wt 103 lb (46.7 kg)   LMP  (LMP Unknown)   BMI 17.68 kg/m    GEN: Well nourished, well developed, in no acute distress  HEENT: normal  Neck: no JVD, carotid bruits, or masses Cardiac: RRR; no murmurs, rubs, or gallops,no edema  Respiratory:  clear to auscultation bilaterally, normal work of breathing GI: soft, nontender, nondistended, + BS MS: no deformity or atrophy  Skin: warm and dry, no rash, rheumatologic changes noted Neuro:  Alert and Oriented x 3, Strength and sensation are intact Psych: euthymic mood, full affect   Wt Readings from Last 3 Encounters:  12/14/17 103 lb (46.7 kg)  11/24/17 104 lb 1.6 oz (47.2 kg)  10/01/17 103 lb 12.8 oz (47.1 kg)      Studies/Labs Reviewed:   EKG:  10/01/17-sinus rhythm heart rate 93 with PACs, bigeminal pattern.  Personally reviewed.  09/30/16-sinus rhythm 76 left bundle branch block personally viewed-prior 09/19/15-sinus rhythm, 86, left bundle branch block  Recent Labs: 09/24/2017: Hemoglobin 10.9; Platelets 180; TSH 3.597 09/27/2017: BUN 7; Creatinine, Ser 1.01; Potassium 3.5; Sodium 125  Lipid Panel    Component Value Date/Time   CHOL 143 09/24/2017 0343   CHOL 150 11/25/2016 0920   TRIG 24 09/24/2017 0343   HDL 96 09/24/2017 0343   HDL 105 11/25/2016 0920   CHOLHDL 1.5 09/24/2017 0343   VLDL 5 09/24/2017 0343   LDLCALC 42 09/24/2017 0343   LDLCALC 40 11/25/2016 0920    Additional studies/ records that were reviewed today include:   Echo: 06/23/12: - Left ventricle: The cavity size was normal.  Systolic function was normal. The estimated ejection fraction was in the range of 55% to 60%. Wall motion was normal; there were no regional wall motion abnormalities. - Atrial septum: No defect or patent foramen ovale was identified.  NUC stress 06/23/12: -Normal Lexiscan myovue EF 83%  Vascular ultrasound of carotids 09/25/17:  Final Interpretation: Right Carotid: Velocities in the right ICA are consistent with a 1-39% stenosis.  Left Carotid: Velocities in the left ICA are consistent with a 40-59% stenosis.       Left mid ICA, 40-59% stenosis could be secondary to tortuous       vessel.   ASSESSMENT:    1. LBBB (left bundle branch block)   2. Bilateral carotid bruits   3. Tobacco use      PLAN:  In order of problems listed above:  1. Carotid artery diesease - moderate, repeating in one year. Atorvastatin 40 mg once a day.  Doing well. 2. Tobacco use - Continue to advocate smoking cessation.  4 cigarettes a day only.  Keep working on it. 3. Left bundle branch block is chronic. No high-risk symptoms such as syncope. No chest pain.  No changes made. 4. PAC - benign.  No further issues  5. hyponatremia - chronic, 125 126.  She has been worked up by her primary physician for this. 6. Chest pain - likely stress related from husbands suicide. NUC, echo ok. Continue to work through her stress.  She is seeing a Social worker.  Doing much better.  Still struggling at times.  No further chest discomfort.  12 month follow up  Medication Adjustments/Labs and Tests Ordered: Current medicines are reviewed at length with the patient today.  Concerns regarding medicines are outlined above.  Medication changes, Labs and Tests ordered today are listed in the Patient Instructions below. Patient Instructions  Medication Instructions:  The current medical regimen is effective;  continue present plan and medications.  Follow-Up: Follow up in 1 year with Dr. Marlou Porch.  You  will receive a letter in the mail 2 months before you are due.  Please call us when you receive this letter to schedule your follow up appointment.  If you need a refill on your cardiac medications before your next appointment, please call your pharmacy.  Thank you for choosing [facility]!!          Signed, Candee Furbish, MD  12/14/2017 3:33 PM    [address], [place], [place]  [address] Phone: [phone]; Fax: [phone]

## 2017-12-14 NOTE — Patient Instructions (Signed)

## 2017-12-15 DIAGNOSIS — I10 Essential (primary) hypertension: Secondary | ICD-10-CM | POA: Diagnosis not present

## 2017-12-15 DIAGNOSIS — F331 Major depressive disorder, recurrent, moderate: Secondary | ICD-10-CM | POA: Diagnosis not present

## 2017-12-15 DIAGNOSIS — E538 Deficiency of other specified B group vitamins: Secondary | ICD-10-CM | POA: Diagnosis not present

## 2017-12-30 ENCOUNTER — Other Ambulatory Visit: Payer: Self-pay | Admitting: Cardiology

## 2018-02-03 ENCOUNTER — Other Ambulatory Visit: Payer: Self-pay

## 2018-02-03 ENCOUNTER — Telehealth: Payer: Self-pay | Admitting: Cardiology

## 2018-02-03 ENCOUNTER — Emergency Department (HOSPITAL_COMMUNITY): Payer: PPO

## 2018-02-03 ENCOUNTER — Encounter (HOSPITAL_COMMUNITY): Payer: Self-pay | Admitting: Emergency Medicine

## 2018-02-03 ENCOUNTER — Emergency Department (HOSPITAL_COMMUNITY)
Admission: EM | Admit: 2018-02-03 | Discharge: 2018-02-03 | Disposition: A | Discharge status: Home / Self Care | Payer: PPO | Attending: Emergency Medicine | Admitting: Emergency Medicine

## 2018-02-03 DIAGNOSIS — Z91040 Latex allergy status: Secondary | ICD-10-CM | POA: Diagnosis not present

## 2018-02-03 DIAGNOSIS — R0602 Shortness of breath: Secondary | ICD-10-CM | POA: Diagnosis not present

## 2018-02-03 DIAGNOSIS — F419 Anxiety disorder, unspecified: Secondary | ICD-10-CM | POA: Insufficient documentation

## 2018-02-03 DIAGNOSIS — F17210 Nicotine dependence, cigarettes, uncomplicated: Secondary | ICD-10-CM | POA: Insufficient documentation

## 2018-02-03 DIAGNOSIS — I10 Essential (primary) hypertension: Secondary | ICD-10-CM | POA: Diagnosis not present

## 2018-02-03 DIAGNOSIS — R0789 Other chest pain: Secondary | ICD-10-CM | POA: Diagnosis not present

## 2018-02-03 DIAGNOSIS — R079 Chest pain, unspecified: Secondary | ICD-10-CM | POA: Diagnosis not present

## 2018-02-03 DIAGNOSIS — F329 Major depressive disorder, single episode, unspecified: Secondary | ICD-10-CM | POA: Diagnosis not present

## 2018-02-03 DIAGNOSIS — Z79899 Other long term (current) drug therapy: Secondary | ICD-10-CM | POA: Diagnosis not present

## 2018-02-03 DIAGNOSIS — R457 State of emotional shock and stress, unspecified: Secondary | ICD-10-CM | POA: Diagnosis not present

## 2018-02-03 DIAGNOSIS — J449 Chronic obstructive pulmonary disease, unspecified: Secondary | ICD-10-CM | POA: Insufficient documentation

## 2018-02-03 DIAGNOSIS — Z7982 Long term (current) use of aspirin: Secondary | ICD-10-CM | POA: Insufficient documentation

## 2018-02-03 DIAGNOSIS — R42 Dizziness and giddiness: Secondary | ICD-10-CM | POA: Diagnosis not present

## 2018-02-03 LAB — BASIC METABOLIC PANEL
Anion gap: 8 (ref 5–15)
BUN: 14 mg/dL (ref 6–20)
CO2: 22 mmol/L (ref 22–32)
Calcium: 9.4 mg/dL (ref 8.9–10.3)
Chloride: 98 mmol/L — ABNORMAL LOW (ref 101–111)
Creatinine, Ser: 1.13 mg/dL — ABNORMAL HIGH (ref 0.44–1.00)
GFR calc Af Amer: 55 mL/min — ABNORMAL LOW (ref >60)
GFR calc non Af Amer: 47 mL/min — ABNORMAL LOW (ref >60)
Glucose, Bld: 121 mg/dL — ABNORMAL HIGH (ref 65–99)
POTASSIUM: 4.5 mmol/L (ref 3.5–5.1)
Sodium: 128 mmol/L — ABNORMAL LOW (ref 135–145)

## 2018-02-03 LAB — I-STAT CHEM 8, ED
BUN: 16 mg/dL (ref 6–20)
CALCIUM ION: 1.18 mmol/L (ref 1.15–1.40)
CHLORIDE: 96 mmol/L — AB (ref 101–111)
CREATININE: 1.1 mg/dL — AB (ref 0.44–1.00)
GLUCOSE: 118 mg/dL — AB (ref 65–99)
HCT: 29 % — ABNORMAL LOW (ref 36.0–46.0)
Hemoglobin: 9.9 g/dL — ABNORMAL LOW (ref 12.0–15.0)
Potassium: 4.4 mmol/L (ref 3.5–5.1)
Sodium: 128 mmol/L — ABNORMAL LOW (ref 135–145)
TCO2: 21 mmol/L — ABNORMAL LOW (ref 22–32)

## 2018-02-03 LAB — CBC
HCT: 29.3 % — ABNORMAL LOW (ref 36.0–46.0)
Hemoglobin: 10.4 g/dL — ABNORMAL LOW (ref 12.0–15.0)
MCH: 35.5 pg — ABNORMAL HIGH (ref 26.0–34.0)
MCHC: 35.5 g/dL (ref 30.0–36.0)
MCV: 100 fL (ref 78.0–100.0)
Platelets: 189 10*3/uL (ref 150–400)
RBC: 2.93 MIL/uL — ABNORMAL LOW (ref 3.87–5.11)
RDW: 13.9 % (ref 11.5–15.5)
WBC: 6.2 10*3/uL (ref 4.0–10.5)

## 2018-02-03 LAB — I-STAT TROPONIN, ED
Troponin i, poc: 0 ng/mL (ref 0.00–0.08)
Troponin i, poc: 0.01 ng/mL (ref 0.00–0.08)

## 2018-02-03 NOTE — ED Notes (Signed)
Ambulatory to restroom without event
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
## 2018-02-03 NOTE — Telephone Encounter (Signed)
Spoke with the patient that called in complaining of worsening chest pain over the last few hours. She reports that she has been having shortness of breath, weakness in her left arm, some dizziness, and says it is worse than her usual chest pain and different from the pain she has in the center of her chest when she is having anxiety. She says it is on the left side of her chest and comes and goes. Not related to movement. She took prilosec and xanax which usually helps but has not this time. She is tearful on the phone and says she is feeling worse than usual. I advised pt that we do not recommend that she drive with these symptoms and she agrees. Says she is going to have EMS take her to the ER now. She is very anxious based on her symptoms and says she just doesn't fell well.
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
## 2018-02-03 NOTE — Telephone Encounter (Signed)
New message      Pt c/o of Chest Pain: STAT if CP now or developed within 24 hours  1. Are you having CP right now?  Chest/boob area hurts now  2. Are you experiencing any other symptoms (ex. SOB, nausea, vomiting, sweating)? sob  3. How long have you been experiencing CP?  Started last night 4. Is your CP continuous or coming and going?  Comes and goes  5. Have you taken Nitroglycerin? Do not have nitro ?

## 2018-02-03 NOTE — Discharge Instructions (Addendum)
Your blood work, EKG and chest x-ray were reassuring.  Please take your home blood pressure medication tomorrow morning as prescribed.  Return to the ER if you have any new or worsening symptoms like worsening chest pain, trouble breathing, feeling as if you are going to pass out.

## 2018-02-09 DIAGNOSIS — R079 Chest pain, unspecified: Secondary | ICD-10-CM | POA: Diagnosis not present

## 2018-03-28 ENCOUNTER — Other Ambulatory Visit: Payer: Self-pay | Admitting: Cardiology

## 2018-05-31 ENCOUNTER — Other Ambulatory Visit: Payer: Self-pay | Admitting: Family Medicine

## 2018-05-31 DIAGNOSIS — Z1231 Encounter for screening mammogram for malignant neoplasm of breast: Secondary | ICD-10-CM

## 2018-06-01 DIAGNOSIS — Z681 Body mass index (BMI) 19 or less, adult: Secondary | ICD-10-CM | POA: Diagnosis not present

## 2018-06-01 DIAGNOSIS — M0609 Rheumatoid arthritis without rheumatoid factor, multiple sites: Secondary | ICD-10-CM | POA: Diagnosis not present

## 2018-06-01 DIAGNOSIS — Z79899 Other long term (current) drug therapy: Secondary | ICD-10-CM | POA: Diagnosis not present

## 2018-06-01 DIAGNOSIS — M8580 Other specified disorders of bone density and structure, unspecified site: Secondary | ICD-10-CM | POA: Diagnosis not present

## 2018-06-01 DIAGNOSIS — Z23 Encounter for immunization: Secondary | ICD-10-CM | POA: Diagnosis not present

## 2018-06-01 DIAGNOSIS — K5289 Other specified noninfective gastroenteritis and colitis: Secondary | ICD-10-CM | POA: Diagnosis not present

## 2018-06-01 DIAGNOSIS — M2550 Pain in unspecified joint: Secondary | ICD-10-CM | POA: Diagnosis not present

## 2018-06-10 ENCOUNTER — Other Ambulatory Visit: Payer: Self-pay | Admitting: Family Medicine

## 2018-06-10 DIAGNOSIS — E2839 Other primary ovarian failure: Secondary | ICD-10-CM

## 2018-06-14 DIAGNOSIS — M5431 Sciatica, right side: Secondary | ICD-10-CM | POA: Diagnosis not present

## 2018-06-15 ENCOUNTER — Other Ambulatory Visit: Payer: Self-pay | Admitting: Family Medicine

## 2018-06-15 DIAGNOSIS — M5431 Sciatica, right side: Secondary | ICD-10-CM

## 2018-06-23 ENCOUNTER — Ambulatory Visit
Admission: RE | Admit: 2018-06-23 | Discharge: 2018-06-23 | Disposition: A | Discharge status: Home / Self Care | Payer: PPO | Source: Ambulatory Visit | Attending: Family Medicine | Admitting: Family Medicine

## 2018-06-23 DIAGNOSIS — M5416 Radiculopathy, lumbar region: Secondary | ICD-10-CM | POA: Diagnosis not present

## 2018-06-23 DIAGNOSIS — M5431 Sciatica, right side: Secondary | ICD-10-CM

## 2018-06-24 DIAGNOSIS — M5441 Lumbago with sciatica, right side: Secondary | ICD-10-CM | POA: Diagnosis not present

## 2018-06-24 DIAGNOSIS — K52831 Collagenous colitis: Secondary | ICD-10-CM | POA: Diagnosis not present

## 2018-06-24 DIAGNOSIS — R197 Diarrhea, unspecified: Secondary | ICD-10-CM | POA: Diagnosis not present

## 2018-06-24 DIAGNOSIS — I7300 Raynaud's syndrome without gangrene: Secondary | ICD-10-CM | POA: Diagnosis not present

## 2018-06-24 DIAGNOSIS — K219 Gastro-esophageal reflux disease without esophagitis: Secondary | ICD-10-CM | POA: Diagnosis not present

## 2018-06-24 DIAGNOSIS — M069 Rheumatoid arthritis, unspecified: Secondary | ICD-10-CM | POA: Diagnosis not present

## 2018-06-24 DIAGNOSIS — F4321 Adjustment disorder with depressed mood: Secondary | ICD-10-CM | POA: Diagnosis not present

## 2018-06-24 DIAGNOSIS — Z8619 Personal history of other infectious and parasitic diseases: Secondary | ICD-10-CM | POA: Diagnosis not present

## 2018-06-28 DIAGNOSIS — Z681 Body mass index (BMI) 19 or less, adult: Secondary | ICD-10-CM | POA: Diagnosis not present

## 2018-06-28 DIAGNOSIS — R197 Diarrhea, unspecified: Secondary | ICD-10-CM | POA: Diagnosis not present

## 2018-06-28 DIAGNOSIS — M5126 Other intervertebral disc displacement, lumbar region: Secondary | ICD-10-CM | POA: Diagnosis not present

## 2018-06-29 ENCOUNTER — Other Ambulatory Visit: Payer: Self-pay

## 2018-06-30 DIAGNOSIS — R350 Frequency of micturition: Secondary | ICD-10-CM | POA: Diagnosis not present

## 2018-07-07 ENCOUNTER — Other Ambulatory Visit: Payer: Self-pay

## 2018-07-07 NOTE — Patient Outreach (Addendum)
[facility]) Care Management  07/07/2018  Regina Ortiz 11-18-1945 [id number]  TELEPHONE SCREENING Referral date: 06/29/18 Referral source: emmi prevent Referral reason: emmi prevent score Insurance: health team advantage  Telephone call to patient regarding regarding EMMI prevent referral. HIPAA verified with patient. Explained reason for call. Patient states she has a lot going on. Patient states she is in the process of seeing a neurosurgeon for her back pain. She states she has had an MRI.  Patient states she has pain in her back, leg and butt area.   Patient states she has recently found out that her A1c level is high. She states she is not sure of the exact number. Patient states her primary MD is going to do a metabolic panel in 4 weeks to further determine her status.   RNCM discussed and offered [facility] care management services. Patient agreeable to receiving [facility] care management brochure for future reference. Patient states she will call  [facility] if she feels like services are needed at a later time.  RNCM gave patient contact name and number for 24 hour nurse advise line.   PLAN: RNCM will close patient due to patient refusing services at this time.  RNCM will send patient [facility] brochure/ magnet RNCm will send patients primary MD closure letter.   Quinn Plowman RN,BSN,CCM [facility] Telephonic  [phone]

## 2018-07-12 ENCOUNTER — Ambulatory Visit: Payer: PPO

## 2018-07-15 DIAGNOSIS — R197 Diarrhea, unspecified: Secondary | ICD-10-CM | POA: Diagnosis not present

## 2018-07-15 DIAGNOSIS — Z8619 Personal history of other infectious and parasitic diseases: Secondary | ICD-10-CM | POA: Diagnosis not present

## 2018-07-15 DIAGNOSIS — K5289 Other specified noninfective gastroenteritis and colitis: Secondary | ICD-10-CM | POA: Diagnosis not present

## 2018-07-28 DIAGNOSIS — R7309 Other abnormal glucose: Secondary | ICD-10-CM | POA: Diagnosis not present

## 2018-08-09 DIAGNOSIS — M5126 Other intervertebral disc displacement, lumbar region: Secondary | ICD-10-CM | POA: Diagnosis not present

## 2018-08-12 ENCOUNTER — Ambulatory Visit
Admission: RE | Admit: 2018-08-12 | Discharge: 2018-08-12 | Disposition: A | Discharge status: Home / Self Care | Payer: PPO | Source: Ambulatory Visit

## 2018-08-12 ENCOUNTER — Ambulatory Visit
Admission: RE | Admit: 2018-08-12 | Discharge: 2018-08-12 | Disposition: A | Discharge status: Home / Self Care | Payer: PPO | Source: Ambulatory Visit | Attending: Family Medicine | Admitting: Family Medicine

## 2018-08-12 DIAGNOSIS — Z1231 Encounter for screening mammogram for malignant neoplasm of breast: Secondary | ICD-10-CM | POA: Diagnosis not present

## 2018-08-12 DIAGNOSIS — E2839 Other primary ovarian failure: Secondary | ICD-10-CM

## 2018-08-12 DIAGNOSIS — M85832 Other specified disorders of bone density and structure, left forearm: Secondary | ICD-10-CM | POA: Diagnosis not present

## 2018-08-12 DIAGNOSIS — M810 Age-related osteoporosis without current pathological fracture: Secondary | ICD-10-CM | POA: Diagnosis not present

## 2018-08-23 ENCOUNTER — Ambulatory Visit: Payer: PPO

## 2018-09-08 ENCOUNTER — Ambulatory Visit: Payer: PPO | Admitting: Vascular Surgery

## 2018-09-08 ENCOUNTER — Ambulatory Visit (HOSPITAL_COMMUNITY)
Admission: RE | Admit: 2018-09-08 | Discharge: 2018-09-08 | Disposition: A | Discharge status: Home / Self Care | Payer: PPO | Source: Ambulatory Visit | Attending: Vascular Surgery | Admitting: Vascular Surgery

## 2018-09-08 ENCOUNTER — Other Ambulatory Visit: Payer: Self-pay

## 2018-09-08 ENCOUNTER — Encounter: Payer: Self-pay | Admitting: Vascular Surgery

## 2018-09-08 VITALS — BP 142/64 | HR 77 | Temp 97.9°F | Resp 18 | Ht 61.0 in | Wt 103.0 lb

## 2018-09-08 DIAGNOSIS — I6522 Occlusion and stenosis of left carotid artery: Secondary | ICD-10-CM

## 2018-09-08 DIAGNOSIS — I6529 Occlusion and stenosis of unspecified carotid artery: Secondary | ICD-10-CM
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
## 2018-09-11 ENCOUNTER — Encounter (HOSPITAL_COMMUNITY): Payer: Self-pay | Admitting: *Deleted

## 2018-09-11 ENCOUNTER — Ambulatory Visit (HOSPITAL_COMMUNITY)
Admission: EM | Admit: 2018-09-11 | Discharge: 2018-09-11 | Disposition: A | Discharge status: Home / Self Care | Payer: PPO | Attending: Family Medicine | Admitting: Family Medicine

## 2018-09-11 ENCOUNTER — Other Ambulatory Visit: Payer: Self-pay

## 2018-09-11 DIAGNOSIS — I10 Essential (primary) hypertension: Secondary | ICD-10-CM

## 2018-09-11 DIAGNOSIS — Z23 Encounter for immunization: Secondary | ICD-10-CM | POA: Diagnosis not present

## 2018-09-11 DIAGNOSIS — W540XXA Bitten by dog, initial encounter: Secondary | ICD-10-CM

## 2018-09-11 DIAGNOSIS — S61411A Laceration without foreign body of right hand, initial encounter: Secondary | ICD-10-CM | POA: Diagnosis not present

## 2018-09-11 MED ORDER — LIDOCAINE-EPINEPHRINE (PF) 2 %-1:200000 IJ SOLN
INTRAMUSCULAR | Status: AC
  Filled 2018-09-11: qty 20

## 2018-09-11 MED ORDER — TETANUS-DIPHTH-ACELL PERTUSSIS 5-2.5-18.5 LF-MCG/0.5 IM SUSP
0.5000 mL | Freq: Once | INTRAMUSCULAR | Status: AC
  Administered 2018-09-11: 0.5 mL via INTRAMUSCULAR

## 2018-09-11 MED ORDER — TETANUS-DIPHTH-ACELL PERTUSSIS 5-2.5-18.5 LF-MCG/0.5 IM SUSP
INTRAMUSCULAR | Status: AC
  Filled 2018-09-11: qty 0.5

## 2018-09-11 NOTE — ED Triage Notes (Signed)
States she was bitten by her dog this am  On right hand and it will not stop bleeding

## 2018-09-11 NOTE — Discharge Instructions (Signed)
WOUND CARE Please return in 7-10 days to have your stitches/staples removed or sooner if you have concerns.  Keep area clean and dry for 24 hours. Do not remove bandage, if applied.  After 24 hours, remove bandage and wash wound gently with mild soap and warm water. Reapply a new bandage after cleaning wound, if directed.  Continue daily cleansing with soap and water until stitches/staples are removed.  Do not apply any further ointments or creams to the wound while stitches/staples are in place, as this may cause delayed healing.  Notify the office if you experience any of the following signs of infection: Swelling, redness, pus drainage, streaking, fever >101.0 F  Notify the office if you experience excessive bleeding that does not stop after 15-20 minutes of constant, firm pressure.

## 2018-09-12 ENCOUNTER — Emergency Department (HOSPITAL_COMMUNITY): Payer: PPO

## 2018-09-12 ENCOUNTER — Encounter (HOSPITAL_COMMUNITY): Payer: Self-pay

## 2018-09-12 ENCOUNTER — Ambulatory Visit (HOSPITAL_COMMUNITY)
Admission: EM | Admit: 2018-09-12 | Discharge: 2018-09-12 | Disposition: A | Discharge status: Nursing Home (Includes ICF) | Payer: PPO | Source: Home / Self Care

## 2018-09-12 ENCOUNTER — Emergency Department (HOSPITAL_COMMUNITY)
Admission: EM | Admit: 2018-09-12 | Discharge: 2018-09-12 | Disposition: A | Discharge status: Home / Self Care | Payer: PPO | Attending: Emergency Medicine | Admitting: Emergency Medicine

## 2018-09-12 DIAGNOSIS — J449 Chronic obstructive pulmonary disease, unspecified: Secondary | ICD-10-CM | POA: Diagnosis not present

## 2018-09-12 DIAGNOSIS — M79641 Pain in right hand: Secondary | ICD-10-CM | POA: Insufficient documentation

## 2018-09-12 DIAGNOSIS — R079 Chest pain, unspecified: Secondary | ICD-10-CM | POA: Diagnosis not present

## 2018-09-12 DIAGNOSIS — F17210 Nicotine dependence, cigarettes, uncomplicated: Secondary | ICD-10-CM | POA: Insufficient documentation

## 2018-09-12 DIAGNOSIS — I10 Essential (primary) hypertension: Secondary | ICD-10-CM | POA: Diagnosis not present

## 2018-09-12 DIAGNOSIS — S61451A Open bite of right hand, initial encounter: Secondary | ICD-10-CM | POA: Diagnosis not present

## 2018-09-12 DIAGNOSIS — Z23 Encounter for immunization: Secondary | ICD-10-CM | POA: Diagnosis not present

## 2018-09-12 DIAGNOSIS — W540XXA Bitten by dog, initial encounter: Secondary | ICD-10-CM | POA: Diagnosis not present

## 2018-09-12 DIAGNOSIS — W540XXD Bitten by dog, subsequent encounter: Secondary | ICD-10-CM | POA: Diagnosis not present

## 2018-09-12 DIAGNOSIS — S61411A Laceration without foreign body of right hand, initial encounter: Secondary | ICD-10-CM | POA: Diagnosis not present

## 2018-09-12 LAB — BASIC METABOLIC PANEL
Anion gap: 8 (ref 5–15)
BUN: 11 mg/dL (ref 8–23)
CO2: 24 mmol/L (ref 22–32)
Calcium: 9.1 mg/dL (ref 8.9–10.3)
Chloride: 95 mmol/L — ABNORMAL LOW (ref 98–111)
Creatinine, Ser: 1.11 mg/dL — ABNORMAL HIGH (ref 0.44–1.00)
GFR calc Af Amer: 57 mL/min — ABNORMAL LOW (ref >60)
GFR, EST NON AFRICAN AMERICAN: 49 mL/min — AB (ref >60)
GLUCOSE: 108 mg/dL — AB (ref 70–99)
POTASSIUM: 4.4 mmol/L (ref 3.5–5.1)
Sodium: 127 mmol/L — ABNORMAL LOW (ref 135–145)

## 2018-09-12 LAB — CBC
HEMATOCRIT: 28.1 % — AB (ref 36.0–46.0)
Hemoglobin: 9.6 g/dL — ABNORMAL LOW (ref 12.0–15.0)
MCH: 36.1 pg — ABNORMAL HIGH (ref 26.0–34.0)
MCHC: 34.2 g/dL (ref 30.0–36.0)
MCV: 105.6 fL — AB (ref 80.0–100.0)
NRBC: 0 % (ref 0.0–0.2)
PLATELETS: 182 10*3/uL (ref 150–400)
RBC: 2.66 MIL/uL — AB (ref 3.87–5.11)
RDW: 13.8 % (ref 11.5–15.5)
WBC: 6.7 10*3/uL (ref 4.0–10.5)

## 2018-09-12 LAB — I-STAT TROPONIN, ED: Troponin i, poc: 0 ng/mL (ref 0.00–0.08)

## 2018-09-12 MED ORDER — AMOXICILLIN-POT CLAVULANATE 875-125 MG PO TABS: 1.0000 | ORAL_TABLET | Freq: Two times a day (BID) | ORAL | 0 refills | Status: DC

## 2018-09-12 MED ORDER — OXYCODONE HCL 5 MG PO TABS
5.0000 mg | ORAL_TABLET | Freq: Once | ORAL | Status: AC
  Administered 2018-09-12: 5 mg via ORAL
  Filled 2018-09-12: qty 1

## 2018-09-12 MED ORDER — AMOXICILLIN-POT CLAVULANATE 875-125 MG PO TABS
1.0000 | ORAL_TABLET | Freq: Once | ORAL | Status: AC
  Administered 2018-09-12: 1 via ORAL
  Filled 2018-09-12: qty 1

## 2018-09-12 MED ORDER — OXYCODONE HCL 5 MG PO TABS: 5.0000 mg | ORAL_TABLET | ORAL | 0 refills | Status: DC | PRN

## 2018-09-12 NOTE — ED Provider Notes (Signed)
[facility] Emergency Department Provider Note MRN:  [id number]  Arrival date & time: 09/12/18     Chief Complaint   Chest Pain and Wound Check   History of Present Illness   Regina Ortiz is a 73 y.o. year-old female with a history of hypertension, COPD presenting to the ED with chief complaint of hand pain.  Patient was bit by her toy poodle yesterday afternoon.  Sustained an injury to her right hand, had trouble controlling the bleeding at home, was evaluated at her PCPs office.  Sutures were placed, she was advised to keep a close eye on the hand for infection.  Here today because the pain is worse, moderate in severity, worse with motion or palpation.  Denies fever.  Patient also explains that the hand pain is severe enough to be causing her central chest pain and dizziness.  She explains this is a longstanding problem for her ever since she witnessed her husband killer himself.  Pain is in the center of the chest, sharp, nonradiating.  Review of Systems  A complete 10 system review of systems was obtained and all systems are negative except as noted in the HPI and PMH.   Patient's Health History    Past Medical History:  Diagnosis Date  . AIN grade I   . Anxiety   . Bruises easily   . Chronic diarrhea   . Chronic hyponatremia   . Colitis   . COPD (chronic obstructive pulmonary disease) ([place])   . Depression   . GERD (gastroesophageal reflux disease)   . History of chronic bronchitis   . History of vulvar dysplasia    serveral recurrency's   . Hypertension   . LBBB (left bundle branch block) W/ PROLONGED PR   CARDIOLOGSIT-  DR Johnsie Cancel-   LOV IN EPIC  . Macrocytosis   . Nocturia   . Osteoporosis   . Seronegative rheumatoid arthritis ([place])   . Short of breath on exertion   . Smokers' cough ([place])   . Venous insufficiency of leg    , Edema  . VIN III (vulvar intraepithelial neoplasia III)    RECURRENT -    Past Surgical History:  Procedure  Laterality Date  . APPENDECTOMY  1992  . CARDIOVASCULAR STRESS TEST  06-23-2012  DR Jenkins [place]   NORMAL LEXISCAN MYOVIEW/ EF 83%/  NO ISCHEMIA  . CO2 LASER APPLICATION  64/33/2951   Procedure: CO2 LASER APPLICATION;  Surgeon: Janie Morning, MD PHD;  Location: [facility];  Service: Gynecology;  Laterality: N/A;  Laser Vaporization  . CO2 LASER APPLICATION N/A 04/08/4165   Procedure: CO2 LASER APPLICATION;  Surgeon: [name], MD;  Location: [facility];  Service: Gynecology;  Laterality: N/A;  . CO2 LASER APPLICATION N/A 0/63/0160   Procedure: CO2 LASER APPLICATION of the vulva;  Surgeon: Janie Morning, MD;  Location: [facility];  Service: Gynecology;  Laterality: N/A;  . EXCISION VULVAR LESIONS  07/2012  . HEMORRHOID SURGERY N/A 04/15/2013   Procedure: Excision of external and internal  hemorrhoid with AIN, Exam under anesthesia;  Surgeon: Odis Hollingshead, MD;  Location: [facility];  Service: General;  Laterality: N/A;  . REPAIR FISTULA IN ANO/  I & D PERIRECTAL ABSCESS  10-26-2002  DR Johnathan Hausen  . TRANSTHORACIC ECHOCARDIOGRAM  06-23-2012   NORMAL LVSF/ EF 55-60%  . VAGINAL HYSTERECTOMY  1997   partial  . VULVAR LESION REMOVAL N/A 11/03/2014   Procedure: CO2 LASER OF  VULVAR ;  Surgeon: [name], MD;  Location: [facility];  Service: Gynecology;  Laterality: N/A;  . VULVECTOMY N/A 03/26/2015   Procedure: WIDE LOCA  EXCISION OF VULVAR;  Surgeon: [name], MD;  Location: [place];  Service: Gynecology;  Laterality: N/A;  . Wide Local Excision of labia majora  04-04-2010   Right-sided lesion, CO2 ablation of right labia minora    Family History  Problem Relation Age of Onset  . Diabetes Mother   . Hypertension Father   . Lymphoma Son   . Cancer Son        lymphoblastic lymphoma    Social History   Socioeconomic History  . Marital status: Widowed    Spouse name: Not on file  . Number of children:  Not on file  . Years of education: Not on file  . Highest education level: Not on file  Occupational History  . Not on file  Social Needs  . Financial resource strain: Not on file  . Food insecurity:    Worry: Not on file    Inability: Not on file  . Transportation needs:    Medical: Not on file    Non-medical: Not on file  Tobacco Use  . Smoking status: Current Every Day Smoker    Packs/day: 0.25    Years: 50.00    Pack years: 12.50    Types: Cigarettes  . Smokeless tobacco: Never Used  Substance and Sexual Activity  . Alcohol use: Yes    Alcohol/week: 2.0 standard drinks    Types: 2 Cans of beer per week  . Drug use: No  . Sexual activity: Yes  Lifestyle  . Physical activity:    Days per week: Not on file    Minutes per session: Not on file  . Stress: Not on file  Relationships  . Social connections:    Talks on phone: Not on file    Gets together: Not on file    Attends religious service: Not on file    Active member of club or organization: Not on file    Attends meetings of clubs or organizations: Not on file    Relationship status: Not on file  . Intimate partner violence:    Fear of current or ex partner: Not on file    Emotionally abused: Not on file    Physically abused: Not on file    Forced sexual activity: Not on file  Other Topics Concern  . Not on file  Social History Narrative  . Not on file     Physical Exam  Vital Signs and Nursing Notes reviewed Vitals:   09/12/18 1126 09/12/18 1515  BP: (!) 182/76 (!) 142/57  Pulse: 84 76  Resp: 17 18  Temp: 98.9 F (37.2 C)   SpO2: 99% 99%    CONSTITUTIONAL: Well-appearing, NAD NEURO:  Alert and oriented x 3, no focal deficits EYES:  eyes equal and reactive ENT/NECK:  no LAD, no JVD CARDIO: Regular rate, well-perfused, normal S1 and S2 PULM:  CTAB no wheezing or rhonchi GI/GU:  normal bowel sounds, non-distended, non-tender MSK/SPINE:  No gross deformities; dorsum of the right hand with sutured  wound, surrounding mild erythema and edema, mild tenderness to palpation SKIN:  no rash, atraumatic PSYCH:  Appropriate speech and behavior  Diagnostic and Interventional Summary    EKG Interpretation  Date/Time:  Sunday September 12 2018 12:01:02 EST Ventricular Rate:  82 PR Interval:  196 QRS Duration: 126 QT  Interval:  426 QTC Calculation: 497 R Axis:   90 Text Interpretation:  Normal sinus rhythm Rightward axis Left bundle branch block Abnormal ECG Confirmed by Gerlene Fee [phone]) on 09/12/2018 12:57:48 PM Also confirmed by Gerlene Fee [phone]), editor Philomena Doheny [phone])  on 09/12/2018 3:42:38 PM      Labs Reviewed  CBC - Abnormal; Notable for the following components:      Result Value   RBC 2.66 (*)    Hemoglobin 9.6 (*)    HCT 28.1 (*)    MCV 105.6 (*)    MCH 36.1 (*)    All other components within normal limits  BASIC METABOLIC PANEL - Abnormal; Notable for the following components:   Sodium 127 (*)    Chloride 95 (*)    Glucose, Bld 108 (*)    Creatinine, Ser 1.11 (*)    GFR calc non Af Amer 49 (*)    GFR calc Af Amer 57 (*)    All other components within normal limits  I-STAT TROPONIN, ED    DG Hand Complete Right  Final Result    DG Chest 2 View  Final Result      Medications  oxyCODONE (Oxy IR/ROXICODONE) immediate release tablet 5 mg (5 mg Oral Given 09/12/18 1159)  amoxicillin-clavulanate (AUGMENTIN) 875-125 MG per tablet 1 tablet (1 tablet Oral Given 09/12/18 1159)  oxyCODONE (Oxy IR/ROXICODONE) immediate release tablet 5 mg (5 mg Oral Given 09/12/18 1449)     Procedures Critical Care  ED Course and Medical Decision Making  I have reviewed the triage vital signs and the nursing notes.  Pertinent labs & imaging results that were available during my care of the patient were reviewed by me and considered in my medical decision making (see below for details).  Increased pain in this dog bite wound but no significant physical exam findings to  suggest deep space hand infection or abscess.  Will x-ray to exclude retained tooth, will start on antibiotic coverage given the mild erythema.  Patient's chest pain seems consistent with her prior episodes of chest pain related to anxiety, but given age and comorbidities will screen with EKG and troponin to exclude ACS.  Little to no concern for PE at this time.  EKG non-concerning, troponin negative, x-ray with no foreign body or tooth.  Favoring continued pain from the trauma and possibility of early infection related to dog bite.  No systemic symptoms, no significant swelling, still in appropriate case for outpatient management with Augmentin and strict return precautions.  Patient advised to recheck the wound in 24 hours and to return if worsening.  After the discussed management above, the patient was determined to be safe for discharge.  The patient was in agreement with this plan and all questions regarding their care were answered.  ED return precautions were discussed and the patient will return to the ED with any significant worsening of condition.  Barth Kirks. Sedonia Small, [place] [email or web address]  Final Clinical Impressions(s) / ED Diagnoses     ICD-10-CM   1. Dog bite, subsequent encounter W54.0XXD   2. Chest pain R07.9 DG Chest 2 View    DG Chest 2 View  3. Pain of right hand M79.641     ED Discharge Orders         Ordered    amoxicillin-clavulanate (AUGMENTIN) 875-125 MG tablet  Every 12 hours     09/12/18 1505    oxyCODONE (ROXICODONE) 5 MG immediate release  tablet  Every 4 hours PRN     09/12/18 1505             Maudie Flakes, MD 09/12/18 1934

## 2018-09-12 NOTE — ED Triage Notes (Signed)
Pt arrives from home c/o dog bite to right hand that happened yesterday.  Received stitches and tetanus shot from [facility] urgent care yesterday.  Needs recheck

## 2018-09-12 NOTE — ED Provider Notes (Signed)
[place]    CSN: [id number] Arrival date & time: 09/11/18  1745     History   Chief Complaint Chief Complaint  Patient presents with  . Animal Bite    HPI Regina Ortiz is a 73 y.o. female history of tobacco use, hypertension, bilateral carotid stenosis, COPD presenting today for evaluation of a wound secondary to dog bite.  Patient states that her dog bit her earlier this morning around 930/10.  Wound was cleaned and dressed by a neighbor.  Since she has had persistent bleeding and bleeding has failed to stop since this morning.  Dog is up-to-date on vaccines.  Patient is unsure when her last tetanus was.  Patient is on 81 mg aspirin daily.  Denies anticoagulant use.  Denies difficulty moving her hand or numbness or tingling.  Main concern is the persistent bleeding.  HPI  Past Medical History:  Diagnosis Date  . AIN grade I   . Anxiety   . Bruises easily   . Chronic diarrhea   . Chronic hyponatremia   . Colitis   . COPD (chronic obstructive pulmonary disease) ([place])   . Depression   . GERD (gastroesophageal reflux disease)   . History of chronic bronchitis   . History of vulvar dysplasia    serveral recurrency's   . Hypertension   . LBBB (left bundle branch block) W/ PROLONGED PR   CARDIOLOGSIT-  DR Johnsie Cancel-   LOV IN EPIC  . Macrocytosis   . Nocturia   . Osteoporosis   . Seronegative rheumatoid arthritis ([place])   . Short of breath on exertion   . Smokers' cough ([place])   . Venous insufficiency of leg    , Edema  . VIN III (vulvar intraepithelial neoplasia III)    RECURRENT -    Patient Active Problem List   Diagnosis Date Noted  . Chest pain 09/25/2017  . Hypertensive urgency 09/24/2017  . Headache 09/24/2017  . Alcohol use 09/24/2017  . AKI (acute kidney injury) ([place]) 09/24/2017  . Bilateral carotid artery stenosis   . Vulvovaginal candidiasis 10/22/2015  . Mild malnutrition ([place]) 07/07/2013  . Chronic bronchitis ([place]) 07/07/2013  . Tobacco  abuse 07/07/2013  . Hypertension   . Short of breath on exertion   . Hemorrhoid   . LBBB (left bundle branch block)   . Anxiety   . Depression   . RA (rheumatoid arthritis) ([place])   . Venous insufficiency of leg   . Chronic hyponatremia   . AIN grade I 04/05/2013  . Diarrhea 09/22/2012  . Hyponatremia 09/22/2012  . Hypokalemia 09/22/2012  . ARF (acute renal failure) ([place]) 09/22/2012  . Atypical chest pain 06/22/2012  . Anemia 06/22/2012  . Vulvar intraepithelial neoplasia III (VIN III) 01/29/2012    Past Surgical History:  Procedure Laterality Date  . APPENDECTOMY  1992  . CARDIOVASCULAR STRESS TEST  06-23-2012  DR Jenkins [place]   NORMAL LEXISCAN MYOVIEW/ EF 83%/  NO ISCHEMIA  . CO2 LASER APPLICATION  03/54/6568   Procedure: CO2 LASER APPLICATION;  Surgeon: Janie Morning, MD PHD;  Location: [facility];  Service: Gynecology;  Laterality: N/A;  Laser Vaporization  . CO2 LASER APPLICATION N/A 09/03/7515   Procedure: CO2 LASER APPLICATION;  Surgeon: [name], MD;  Location: [facility];  Service: Gynecology;  Laterality: N/A;  . CO2 LASER APPLICATION N/A 0/09/7492   Procedure: CO2 LASER APPLICATION of the vulva;  Surgeon: Janie Morning, MD;  Location: [facility];  Service: Gynecology;  Laterality: N/A;  . EXCISION VULVAR LESIONS  07/2012  . HEMORRHOID SURGERY N/A 04/15/2013   Procedure: Excision of external and internal  hemorrhoid with AIN, Exam under anesthesia;  Surgeon: Odis Hollingshead, MD;  Location: [facility];  Service: General;  Laterality: N/A;  . REPAIR FISTULA IN ANO/  I & D PERIRECTAL ABSCESS  10-26-2002  DR Johnathan Hausen  . TRANSTHORACIC ECHOCARDIOGRAM  06-23-2012   NORMAL LVSF/ EF 55-60%  . VAGINAL HYSTERECTOMY  1997   partial  . VULVAR LESION REMOVAL N/A 11/03/2014   Procedure: CO2 LASER OF VULVAR ;  Surgeon: [name], MD;  Location: [facility];  Service: Gynecology;  Laterality: N/A;  . VULVECTOMY N/A  03/26/2015   Procedure: WIDE LOCA  EXCISION OF VULVAR;  Surgeon: [name], MD;  Location: [facility];  Service: Gynecology;  Laterality: N/A;  . Wide Local Excision of labia majora  04-04-2010   Right-sided lesion, CO2 ablation of right labia minora    OB History   No obstetric history on file.      Home Medications    Prior to Admission medications   Medication Sig Start Date End Date Taking? Authorizing Provider  ALPRAZolam Duanne Moron) 0.5 MG tablet Take 0.5 mg by mouth 2 (two) times daily as needed.  11/08/17   [provider]  aspirin 81 MG EC tablet Take 1 tablet (81 mg total) by mouth daily. 09/27/17   Mikhail, Velta Addison, DO  atorvastatin (LIPITOR) 40 MG tablet TAKE 1 TABLET BY MOUTH ONCE DAILY 03/29/18   Jerline Pain, MD  buPROPion (WELLBUTRIN XL) 300 MG 24 hr tablet Take 300 mg by mouth daily.  11/04/17   [provider]  busPIRone (BUSPAR) 30 MG tablet Take 30 mg by mouth 2 (two) times daily.  11/20/14   [provider]  Calcium Carbonate-Vitamin D (CALTRATE 600+D PO) Take 1 tablet by mouth daily.     [provider]  Cholecalciferol (VITAMIN D3) 1000 UNITS CAPS Take 1,000 Units by mouth daily.     [provider]  cycloSPORINE (RESTASIS) 0.05 % ophthalmic emulsion Place 1 drop into both eyes 2 (two) times daily.    [provider]  diltiazem (TIAZAC) 360 MG 24 hr capsule Take 360 mg by mouth daily.     [provider]  Hydroxychloroquine Sulfate (PLAQUENIL PO) Take by mouth.    [provider]  losartan (COZAAR) 100 MG tablet Take 100 mg by mouth daily.  10/23/17   [provider]  omeprazole (PRILOSEC) 20 MG capsule Take 20 mg by mouth every other day.     [provider]  Potassium (POTASSIMIN PO) Take 1 tablet by mouth every evening.     [provider]  predniSONE (DELTASONE) 5 MG tablet Take 5 mg by mouth daily with breakfast. --  LONG-TERM USE FOR RA    [provider]  traZODone (DESYREL) 50 MG tablet Take 50 mg by mouth at bedtime.  11/16/17   [provider]    Family History Family History  Problem Relation Age of Onset  . Diabetes Mother   . Hypertension Father   . Lymphoma Son   . Cancer Son        lymphoblastic lymphoma    Social History Social History   Tobacco Use  . Smoking status: Current Every Day Smoker    Packs/day: 0.25    Years: 50.00    Pack years: 12.50  Types: Cigarettes  . Smokeless tobacco: Never Used  Substance Use Topics  . Alcohol use: Yes    Alcohol/week: 2.0 standard drinks    Types: 2 Cans of beer per week  . Drug use: No     Allergies   Latex; Doxycycline; Lactose intolerance (gi); Lialda [mesalamine]; Prinivil [lisinopril]; Triamcinolone acetonide; Amlodipine; and Lotensin [benazepril hcl]   Review of Systems Review of Systems  Constitutional: Negative for fatigue and fever.  Eyes: Negative for visual disturbance.  Respiratory: Negative for shortness of breath.   Cardiovascular: Negative for chest pain.  Gastrointestinal: Negative for abdominal pain, nausea and vomiting.  Musculoskeletal: Negative for arthralgias and joint swelling.  Skin: Positive for wound. Negative for color change and rash.  Neurological: Negative for dizziness, weakness, light-headedness, numbness and headaches.     Physical Exam Triage Vital Signs ED Triage Vitals  Enc Vitals Group     BP 09/11/18 1802 (!) 163/66     Pulse Rate 09/11/18 1802 88     Resp 09/11/18 1802 18     Temp 09/11/18 1802 99 F (37.2 C)     Temp Source 09/11/18 1802 Oral     SpO2 09/11/18 1802 99 %     Weight --      Height --      Head Circumference --      Peak Flow --      Pain Score 09/11/18 1803 10     Pain Loc --      Pain Edu? --      Excl. in [place]? --    No data found.  Updated Vital Signs BP (!) 163/66 (BP Location: Left Arm)   Pulse 88   Temp 99 F (37.2 C) (Oral)   Resp 18   LMP  (LMP Unknown)    SpO2 99%   Visual Acuity Right Eye Distance:   Left Eye Distance:   Bilateral Distance:    Right Eye Near:   Left Eye Near:    Bilateral Near:     Physical Exam Vitals signs and nursing note reviewed.  Constitutional:      Appearance: She is well-developed.     Comments: No acute distress  HENT:     Head: Normocephalic and atraumatic.     Nose: Nose normal.  Eyes:     Conjunctiva/sclera: Conjunctivae normal.  Neck:     Musculoskeletal: Neck supple.  Cardiovascular:     Rate and Rhythm: Normal rate.  Pulmonary:     Effort: Pulmonary effort is normal. No respiratory distress.  Abdominal:     General: There is no distension.  Musculoskeletal: Normal range of motion.     Comments: Full active range of motion of all fingers on right hand and at rest Radial pulse 2+  Skin:    General: Skin is warm and dry.     Comments: Right hand with open ovular wound in webbing between first and second fingers on dorsum of hand, continuing to bleed.  Other superficial wounds/skin tears to dorsum of hand which are no longer bleeding.  Neurological:     Mental Status: She is alert and oriented to person, place, and time.      UC Treatments / Results  Labs (all labs ordered are listed, but only abnormal results are displayed) Labs Reviewed - No data to display  EKG None  Radiology No results found.  Procedures Laceration Repair Date/Time: 09/12/2018 9:19 AM Performed by: Bird Tailor, Elesa Hacker, PA-C Authorized by: Robyn Haber, MD  Consent:    Consent obtained:  Verbal   Consent given by:  Patient   Risks discussed:  Pain, poor wound healing, poor cosmetic result and infection Anesthesia (see MAR for exact dosages):    Anesthesia method:  Local infiltration   Local anesthetic:  Lidocaine 2% WITH epi Laceration details:    Location:  Hand   Hand location:  R hand, dorsum   Length (cm):  1.5   Depth (mm):  2 Repair type:    Repair type:  Simple Pre-procedure details:     Preparation:  Patient was prepped and draped in usual sterile fashion Exploration:    Hemostasis achieved with:  Epinephrine and tied off vessels   Wound exploration: wound explored through full range of motion     Contaminated: no   Treatment:    Area cleansed with:  Shur-Clens and saline   Amount of cleaning:  Standard   Irrigation solution:  Sterile water   Irrigation volume:  250   Irrigation method:  Syringe   Visualized foreign bodies/material removed: no   Skin repair:    Repair method:  Sutures   Suture size:  4-0   Suture material:  Prolene   Suture technique:  Horizontal mattress and figure eight   Number of sutures: 2 horizontal mattress, 1 figure 8. Approximation:    Approximation:  Close Post-procedure details:    Dressing:  Non-adherent dressing and antibiotic ointment   Patient tolerance of procedure:  Tolerated well, no immediate complications Comments:     Bleeding appeared to be stopped after applying figure-of-eight stitch  Procedure performed by Dr. Joseph Art   (including critical care time)  Medications Ordered in UC Medications  Tdap (BOOSTRIX) injection 0.5 mL (0.5 mLs Intramuscular Given 09/11/18 1915)    Initial Impression / Assessment and Plan / UC Course  I have reviewed the triage vital signs and the nursing notes.  Pertinent labs & imaging results that were available during my care of the patient were reviewed by me and considered in my medical decision making (see chart for details).     Wound repaired given bleeding, tetanus up-to-date.  Discussed wound care, monitoring for signs of infection specifically because this was a dog bite.  Will have patient have close follow-up.  Return in 7 to 10 days for suture removal.Discussed strict return precautions. Patient verbalized understanding and is agreeable with plan.  Final Clinical Impressions(s) / UC Diagnoses   Final diagnoses:  Dog bite, initial encounter     Discharge Instructions       WOUND CARE Please return in 7-10 days to have your stitches/staples removed or sooner if you have concerns. Marland Kitchen Keep area clean and dry for 24 hours. Do not remove bandage, if applied. . After 24 hours, remove bandage and wash wound gently with mild soap and warm water. Reapply a new bandage after cleaning wound, if directed. . Continue daily cleansing with soap and water until stitches/staples are removed. . Do not apply any further ointments or creams to the wound while stitches/staples are in place, as this may cause delayed healing. . Notify the office if you experience any of the following signs of infection: Swelling, redness, pus drainage, streaking, fever >101.0 F . Notify the office if you experience excessive bleeding that does not stop after 15-20 minutes of constant, firm pressure.   ED Prescriptions    None     Controlled Substance Prescriptions [place] Controlled Substance Registry consulted? Not Applicable   Ladell Lea, [place] C, PA-C  09/12/18 0923  

## 2018-09-12 NOTE — Discharge Instructions (Addendum)
You were evaluated in the Emergency Department and after careful evaluation, we did not find any emergent condition requiring admission or further testing in the hospital.  Your continued pain in your right hand from the dog bite could suggest a possible infection.  It is very important that you take the antibiotics as prescribed.  We gave you your first dose here in the emergency department, please take another tablet before bed tonight.  You can use the strong pain medication provided as needed if you are having pain at night keeping you from sleeping.  Please recheck your hand tomorrow afternoon at about 3 PM, if significantly worsening please return to the emergency department.  Please return to the Emergency Department if you experience any worsening of your condition.  We encourage you to follow up with a primary care provider.  Thank you for allowing [place] to be a part of your care.

## 2018-09-13 DIAGNOSIS — D649 Anemia, unspecified: Secondary | ICD-10-CM | POA: Diagnosis not present

## 2018-09-13 DIAGNOSIS — E871 Hypo-osmolality and hyponatremia: Secondary | ICD-10-CM | POA: Diagnosis not present

## 2018-09-13 DIAGNOSIS — S61459A Open bite of unspecified hand, initial encounter: Secondary | ICD-10-CM | POA: Diagnosis not present

## 2018-09-22 DIAGNOSIS — Z4789 Encounter for other orthopedic aftercare: Secondary | ICD-10-CM | POA: Diagnosis not present

## 2018-09-27 DIAGNOSIS — D649 Anemia, unspecified: Secondary | ICD-10-CM | POA: Diagnosis not present

## 2018-09-27 DIAGNOSIS — D7589 Other specified diseases of blood and blood-forming organs: Secondary | ICD-10-CM | POA: Diagnosis not present

## 2018-09-27 DIAGNOSIS — I10 Essential (primary) hypertension: Secondary | ICD-10-CM | POA: Diagnosis not present

## 2018-09-27 DIAGNOSIS — M069 Rheumatoid arthritis, unspecified: Secondary | ICD-10-CM | POA: Diagnosis not present

## 2018-09-27 DIAGNOSIS — M810 Age-related osteoporosis without current pathological fracture: Secondary | ICD-10-CM | POA: Diagnosis not present

## 2018-09-27 DIAGNOSIS — F331 Major depressive disorder, recurrent, moderate: Secondary | ICD-10-CM | POA: Diagnosis not present

## 2018-09-27 DIAGNOSIS — Z0000 Encounter for general adult medical examination without abnormal findings: Secondary | ICD-10-CM | POA: Diagnosis not present

## 2018-09-27 DIAGNOSIS — E538 Deficiency of other specified B group vitamins: Secondary | ICD-10-CM | POA: Diagnosis not present

## 2018-09-27 DIAGNOSIS — Z136 Encounter for screening for cardiovascular disorders: Secondary | ICD-10-CM | POA: Diagnosis not present

## 2018-09-27 DIAGNOSIS — J449 Chronic obstructive pulmonary disease, unspecified: Secondary | ICD-10-CM | POA: Diagnosis not present

## 2018-10-05 DIAGNOSIS — M5126 Other intervertebral disc displacement, lumbar region: Secondary | ICD-10-CM | POA: Diagnosis not present

## 2018-10-05 DIAGNOSIS — I10 Essential (primary) hypertension: Secondary | ICD-10-CM | POA: Diagnosis not present

## 2018-10-05 DIAGNOSIS — Z681 Body mass index (BMI) 19 or less, adult: Secondary | ICD-10-CM | POA: Diagnosis not present

## 2018-10-06 DIAGNOSIS — S61431A Puncture wound without foreign body of right hand, initial encounter: Secondary | ICD-10-CM | POA: Diagnosis not present

## 2018-10-26 DIAGNOSIS — F331 Major depressive disorder, recurrent, moderate: Secondary | ICD-10-CM | POA: Diagnosis not present

## 2018-10-26 DIAGNOSIS — Z1211 Encounter for screening for malignant neoplasm of colon: Secondary | ICD-10-CM | POA: Diagnosis not present

## 2018-11-30 DIAGNOSIS — Z79899 Other long term (current) drug therapy: Secondary | ICD-10-CM | POA: Diagnosis not present

## 2018-11-30 DIAGNOSIS — M2550 Pain in unspecified joint: Secondary | ICD-10-CM | POA: Diagnosis not present

## 2018-11-30 DIAGNOSIS — M0609 Rheumatoid arthritis without rheumatoid factor, multiple sites: Secondary | ICD-10-CM | POA: Diagnosis not present

## 2018-11-30 DIAGNOSIS — K5289 Other specified noninfective gastroenteritis and colitis: Secondary | ICD-10-CM | POA: Diagnosis not present

## 2018-12-02 DIAGNOSIS — M5126 Other intervertebral disc displacement, lumbar region: Secondary | ICD-10-CM | POA: Diagnosis not present

## 2018-12-02 DIAGNOSIS — Z9889 Other specified postprocedural states: Secondary | ICD-10-CM | POA: Diagnosis not present

## 2018-12-15 ENCOUNTER — Telehealth: Payer: Self-pay | Admitting: Cardiology

## 2018-12-15 NOTE — Telephone Encounter (Signed)
Spoke with Mrs. Augusta,    She wanted her appointment to be scheduled out to next month.

## 2018-12-17 ENCOUNTER — Ambulatory Visit: Payer: PPO | Admitting: Cardiology

## 2018-12-21 ENCOUNTER — Other Ambulatory Visit: Payer: Self-pay | Admitting: Cardiology

## 2019-01-06 ENCOUNTER — Telehealth: Payer: Self-pay | Admitting: *Deleted

## 2019-01-06 NOTE — Telephone Encounter (Signed)
Called to remind pt of upcoming virtual appt with Dr Marlou Porch scheduled for 5/13.  Also requests she give verbal consent for this virtual appt and whether she has a smart phone or needs a phone visit only.

## 2019-01-10 DIAGNOSIS — I10 Essential (primary) hypertension: Secondary | ICD-10-CM | POA: Diagnosis not present

## 2019-01-10 DIAGNOSIS — M5126 Other intervertebral disc displacement, lumbar region: Secondary | ICD-10-CM | POA: Diagnosis not present

## 2019-01-11 NOTE — Telephone Encounter (Signed)
Finally was able to reach pt by phone.  She is aware of her appt for tomorrow and aware she will be called 15 mins prior to review VS/medications then Dr Marlou Porch will call her for the appt.     Virtual Visit Pre-Appointment Phone Call  "(Name), I am calling you today to discuss your upcoming appointment. We are currently trying to limit exposure to the virus that causes COVID-19 by seeing patients at home rather than in the office."  1. "What is the BEST phone number to call the day of the visit?" - include this in appointment notes  2. Do you have or have access to (through a family member/friend) a smartphone with video capability that we can use for your visit?" a. If yes - list this number in appt notes as cell (if different from BEST phone #) and list the appointment type as a VIDEO visit in appointment notes b. If no - list the appointment type as a PHONE visit in appointment notes  3. Confirm consent - "In the setting of the current Covid19 crisis, you are scheduled for a (phone or video) visit with your provider on (date) at (time).  Just as we do with many in-office visits, in order for you to participate in this visit, we must obtain consent.  If you'd like, I can send this to your mychart (if signed up) or email for you to review.  Otherwise, I can obtain your verbal consent now.  All virtual visits are billed to your insurance company just like a normal visit would be.  By agreeing to a virtual visit, we'd like you to understand that the technology does not allow for your provider to perform an examination, and thus may limit your provider's ability to fully assess your condition. If your provider identifies any concerns that need to be evaluated in person, we will make arrangements to do so.  Finally, though the technology is pretty good, we cannot assure that it will always work on either your or our end, and in the setting of a video visit, we may have to convert it to a phone-only  visit.  In either situation, we cannot ensure that we have a secure connection.  Are you willing to proceed?" YES   4. Advise patient to be prepared - "Two hours prior to your appointment, go ahead and check your blood pressure, pulse, oxygen saturation, and your weight (if you have the equipment to check those) and write them all down. When your visit starts, your provider will ask you for this information. If you have an Apple Watch or Kardia device, please plan to have heart rate information ready on the day of your appointment. Please have a pen and paper handy nearby the day of the visit as well."  5. Inform patient they will receive a phone call 15 minutes prior to their appointment time (may be from unknown caller ID) so they should be prepared to answer    TELEPHONE CALL NOTE  Regina Ortiz has been deemed a candidate for a follow-up tele-health visit to limit community exposure during the Covid-19 pandemic. I spoke with the patient via phone to ensure availability of phone/video source, confirm preferred email & phone number, and discuss instructions and expectations.  I reminded Regina Ortiz to be prepared with any vital sign and/or heart rhythm information that could potentially be obtained via home monitoring, at the time of her visit. I reminded Regina Ortiz to expect a  phone call prior to her visit.  Ellwood Dense, RN 01/11/2019 4:22 PM      FULL LENGTH CONSENT FOR TELE-HEALTH VISIT   I hereby voluntarily request, consent and authorize [facility] and its employed or contracted physicians, physician assistants, nurse practitioners or other licensed health care professionals (the Practitioner), to provide me with telemedicine health care services (the Services") as deemed necessary by the treating Practitioner. I acknowledge and consent to receive the Services by the Practitioner via telemedicine. I understand that the telemedicine visit will involve communicating with the  Practitioner through live audiovisual communication technology and the disclosure of certain medical information by electronic transmission. I acknowledge that I have been given the opportunity to request an in-person assessment or other available alternative prior to the telemedicine visit and am voluntarily participating in the telemedicine visit.  I understand that I have the right to withhold or withdraw my consent to the use of telemedicine in the course of my care at any time, without affecting my right to future care or treatment, and that the Practitioner or I may terminate the telemedicine visit at any time. I understand that I have the right to inspect all information obtained and/or recorded in the course of the telemedicine visit and may receive copies of available information for a reasonable fee.  I understand that some of the potential risks of receiving the Services via telemedicine include:   Delay or interruption in medical evaluation due to technological equipment failure or disruption;  Information transmitted may not be sufficient (e.g. poor resolution of images) to allow for appropriate medical decision making by the Practitioner; and/or   In rare instances, security protocols could fail, causing a breach of personal health information.  Furthermore, I acknowledge that it is my responsibility to provide information about my medical history, conditions and care that is complete and accurate to the best of my ability. I acknowledge that Practitioner's advice, recommendations, and/or decision may be based on factors not within their control, such as incomplete or inaccurate data provided by me or distortions of diagnostic images or specimens that may result from electronic transmissions. I understand that the practice of medicine is not an exact science and that Practitioner makes no warranties or guarantees regarding treatment outcomes. I acknowledge that I will receive a copy of this  consent concurrently upon execution via email to the email address I last provided but may also request a printed copy by calling the office of [facility].    I understand that my insurance will be billed for this visit.   I have read or had this consent read to me.  I understand the contents of this consent, which adequately explains the benefits and risks of the Services being provided via telemedicine.   I have been provided ample opportunity to ask questions regarding this consent and the Services and have had my questions answered to my satisfaction.  I give my informed consent for the services to be provided through the use of telemedicine in my medical care  By participating in this telemedicine visit I agree to the above. Pt gives verbal consent

## 2019-01-12 ENCOUNTER — Encounter: Payer: Self-pay | Admitting: Cardiology

## 2019-01-12 ENCOUNTER — Other Ambulatory Visit: Payer: Self-pay

## 2019-01-12 ENCOUNTER — Telehealth (INDEPENDENT_AMBULATORY_CARE_PROVIDER_SITE_OTHER): Payer: PPO | Admitting: Cardiology

## 2019-01-12 VITALS — BP 133/59 | HR 83 | Ht 61.0 in | Wt 101.0 lb

## 2019-01-12 DIAGNOSIS — R0789 Other chest pain: Secondary | ICD-10-CM

## 2019-01-12 DIAGNOSIS — I10 Essential (primary) hypertension: Secondary | ICD-10-CM

## 2019-01-12 DIAGNOSIS — Z01812 Encounter for preprocedural laboratory examination: Secondary | ICD-10-CM

## 2019-01-12 MED ORDER — METOPROLOL TARTRATE 100 MG PO TABS: 100.0000 mg | ORAL_TABLET | Freq: Once | ORAL | 0 refills | Status: DC

## 2019-01-12 NOTE — Patient Instructions (Addendum)
Medication Instructions:  The current medical regimen is effective;  continue present plan and medications.  If you need a refill on your cardiac medications before your next appointment, please call your pharmacy.   Lab work: Please have blood work before your CT scan (BMP) If you have labs (blood work) drawn today and your tests are completely normal, you will receive your results only by: Marland Kitchen MyChart Message (if you have MyChart) OR . A paper copy in the mail If you have any lab test that is abnormal or we need to change your treatment, we will call you to review the results.  Testing/Procedures: Your physician has requested that you have cardiac CT. You will be scheduled for this test once it has been approved by your insurance. Cardiac computed tomography (CT) is a painless test that uses an x-ray machine to take clear, detailed pictures of your heart.  Please follow instruction sheet as given.  Please arrive at the [facility] main entrance of [facility] at xx:xx AM (30-45 minutes prior to test start time)  [facility] [place], [place] [address] [phone]  Proceed to the [facility] Radiology Department (First Floor).  Please follow these instructions carefully (unless otherwise directed):  On the Night Before the Test: . Be sure to Drink plenty of water. . Do not consume any caffeinated/decaffeinated beverages or chocolate 12 hours prior to your test. . Do not take any antihistamines 12 hours prior to your test. . If you take Metformin do not take 24 hours prior to test.  On the Day of the Test: . Drink plenty of water. Do not drink any water within one hour of the test. . Do not eat any food 4 hours prior to the test. . You may take your regular medications prior to the test.  . Take metoprolol (Lopressor) two hours prior to test.  A prescription for this has been sent to your pharmacy. Marland Kitchen HOLD Furosemide/Hydrochlorothiazide morning of  the test.      After the Test: . Drink plenty of water. . After receiving IV contrast, you may experience a mild flushed feeling. This is normal. . On occasion, you may experience a mild rash up to 24 hours after the test. This is not dangerous. If this occurs, you can take Benadryl 25 mg and increase your fluid intake. . If you experience trouble breathing, this can be serious. If it is severe call 911 IMMEDIATELY. If it is mild, please call our office. . If you take any of these medications: Glipizide/Metformin, Avandament, Glucavance, please do not take 48 hours after completing test.   Follow-Up: Follow up in 1 year with Dr. Marlou Porch.  You will receive a letter in the mail 2 months before you are due.  Please call us when you receive this letter to schedule your follow up appointment.  Thank you for choosing [facility]!!
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
## 2019-01-19 ENCOUNTER — Telehealth: Payer: Self-pay | Admitting: Cardiology

## 2019-01-19 NOTE — Telephone Encounter (Signed)
New Message   Patient is calling because when she had her last appt that she mentioned that she was having hard heartbeats. She states that she was advised to call if they continued. Please call t discuss.

## 2019-01-19 NOTE — Telephone Encounter (Signed)
I spoke to the patient who called because she continues to grieve over husband's death and her HR has been elevated to 105 bpm and can't sleep at night. She said taking her Xanax seems to bring it down, but was told to call after last week's visit, if this persists.    She has no other symptoms except for her chronic SOB.  She will be scheduled soon for an ordered Coronary CT, still waiting on that.  She will reach out Friday to update her condition.

## 2019-01-21 ENCOUNTER — Telehealth: Payer: Self-pay | Admitting: Cardiology

## 2019-01-21 DIAGNOSIS — I10 Essential (primary) hypertension: Secondary | ICD-10-CM

## 2019-01-21 DIAGNOSIS — Z79899 Other long term (current) drug therapy: Secondary | ICD-10-CM

## 2019-01-21 NOTE — Telephone Encounter (Signed)
Patient wanted Dr. Marlou Porch to know her BP and HR. Patient is still grieving and stressed after her husband's suicide that was a year ago. Encouraged patient to get involved with a support group and get counseling. Informed patient to not take her xanax to reduce her blood pressure, just to take it as needed for anxiety. Patient was taking extra to help with her blood pressure. Informed patient that Dr. Marlou Porch will look at her readings and determine if she needs any additional medications to help with BP. Will forward to Dr. Marlou Porch and his nurse for further advisement.

## 2019-01-21 NOTE — Telephone Encounter (Signed)
New Message     Pt is calling to leave BP readings  Wednesday 10am   147/72 HR 105           630pm 146/76  HR 80  Thursday     10am    163/74  HR 86                        159/68 HR 53              5PM  132/60 HR 90 Friday Morning          156/78 HR 84              142/64 HR 81

## 2019-01-25 ENCOUNTER — Other Ambulatory Visit: Payer: Self-pay

## 2019-01-25 ENCOUNTER — Emergency Department (HOSPITAL_COMMUNITY)
Admission: EM | Admit: 2019-01-25 | Discharge: 2019-01-25 | Disposition: A | Discharge status: Home / Self Care | Payer: PPO | Source: Home / Self Care | Attending: Emergency Medicine | Admitting: Emergency Medicine

## 2019-01-25 ENCOUNTER — Telehealth: Payer: Self-pay | Admitting: Cardiology

## 2019-01-25 DIAGNOSIS — Z743 Need for continuous supervision: Secondary | ICD-10-CM | POA: Diagnosis not present

## 2019-01-25 DIAGNOSIS — E569 Vitamin deficiency, unspecified: Secondary | ICD-10-CM | POA: Diagnosis not present

## 2019-01-25 DIAGNOSIS — M0600 Rheumatoid arthritis without rheumatoid factor, unspecified site: Secondary | ICD-10-CM | POA: Diagnosis present

## 2019-01-25 DIAGNOSIS — G834 Cauda equina syndrome: Secondary | ICD-10-CM | POA: Diagnosis present

## 2019-01-25 DIAGNOSIS — R531 Weakness: Secondary | ICD-10-CM | POA: Diagnosis not present

## 2019-01-25 DIAGNOSIS — R339 Retention of urine, unspecified: Secondary | ICD-10-CM | POA: Diagnosis present

## 2019-01-25 DIAGNOSIS — K5900 Constipation, unspecified: Secondary | ICD-10-CM | POA: Diagnosis not present

## 2019-01-25 DIAGNOSIS — I10 Essential (primary) hypertension: Secondary | ICD-10-CM | POA: Diagnosis present

## 2019-01-25 DIAGNOSIS — M069 Rheumatoid arthritis, unspecified: Secondary | ICD-10-CM | POA: Diagnosis not present

## 2019-01-25 DIAGNOSIS — I447 Left bundle-branch block, unspecified: Secondary | ICD-10-CM | POA: Diagnosis not present

## 2019-01-25 DIAGNOSIS — Z1159 Encounter for screening for other viral diseases: Secondary | ICD-10-CM | POA: Diagnosis not present

## 2019-01-25 DIAGNOSIS — M5441 Lumbago with sciatica, right side: Secondary | ICD-10-CM | POA: Insufficient documentation

## 2019-01-25 DIAGNOSIS — M4726 Other spondylosis with radiculopathy, lumbar region: Secondary | ICD-10-CM | POA: Diagnosis not present

## 2019-01-25 DIAGNOSIS — Z833 Family history of diabetes mellitus: Secondary | ICD-10-CM | POA: Diagnosis not present

## 2019-01-25 DIAGNOSIS — M48061 Spinal stenosis, lumbar region without neurogenic claudication: Secondary | ICD-10-CM | POA: Diagnosis present

## 2019-01-25 DIAGNOSIS — M5489 Other dorsalgia: Secondary | ICD-10-CM | POA: Diagnosis not present

## 2019-01-25 DIAGNOSIS — M810 Age-related osteoporosis without current pathological fracture: Secondary | ICD-10-CM | POA: Diagnosis present

## 2019-01-25 DIAGNOSIS — Z91040 Latex allergy status: Secondary | ICD-10-CM | POA: Insufficient documentation

## 2019-01-25 DIAGNOSIS — J449 Chronic obstructive pulmonary disease, unspecified: Secondary | ICD-10-CM | POA: Diagnosis present

## 2019-01-25 DIAGNOSIS — Z7952 Long term (current) use of systemic steroids: Secondary | ICD-10-CM | POA: Diagnosis not present

## 2019-01-25 DIAGNOSIS — K219 Gastro-esophageal reflux disease without esophagitis: Secondary | ICD-10-CM | POA: Diagnosis not present

## 2019-01-25 DIAGNOSIS — F329 Major depressive disorder, single episode, unspecified: Secondary | ICD-10-CM | POA: Insufficient documentation

## 2019-01-25 DIAGNOSIS — R279 Unspecified lack of coordination: Secondary | ICD-10-CM | POA: Diagnosis not present

## 2019-01-25 DIAGNOSIS — M4326 Fusion of spine, lumbar region: Secondary | ICD-10-CM | POA: Diagnosis not present

## 2019-01-25 DIAGNOSIS — Z111 Encounter for screening for respiratory tuberculosis: Secondary | ICD-10-CM | POA: Diagnosis not present

## 2019-01-25 DIAGNOSIS — M5126 Other intervertebral disc displacement, lumbar region: Secondary | ICD-10-CM | POA: Diagnosis not present

## 2019-01-25 DIAGNOSIS — M5416 Radiculopathy, lumbar region: Secondary | ICD-10-CM | POA: Diagnosis not present

## 2019-01-25 DIAGNOSIS — F419 Anxiety disorder, unspecified: Secondary | ICD-10-CM | POA: Insufficient documentation

## 2019-01-25 DIAGNOSIS — R29898 Other symptoms and signs involving the musculoskeletal system: Secondary | ICD-10-CM | POA: Diagnosis present

## 2019-01-25 DIAGNOSIS — R2689 Other abnormalities of gait and mobility: Secondary | ICD-10-CM | POA: Diagnosis not present

## 2019-01-25 DIAGNOSIS — M47816 Spondylosis without myelopathy or radiculopathy, lumbar region: Secondary | ICD-10-CM | POA: Diagnosis present

## 2019-01-25 DIAGNOSIS — R0902 Hypoxemia: Secondary | ICD-10-CM | POA: Diagnosis not present

## 2019-01-25 DIAGNOSIS — Z4789 Encounter for other orthopedic aftercare: Secondary | ICD-10-CM | POA: Diagnosis not present

## 2019-01-25 DIAGNOSIS — M545 Low back pain: Secondary | ICD-10-CM | POA: Diagnosis not present

## 2019-01-25 DIAGNOSIS — Z79899 Other long term (current) drug therapy: Secondary | ICD-10-CM | POA: Insufficient documentation

## 2019-01-25 DIAGNOSIS — Z03818 Encounter for observation for suspected exposure to other biological agents ruled out: Secondary | ICD-10-CM | POA: Diagnosis not present

## 2019-01-25 DIAGNOSIS — R51 Headache: Secondary | ICD-10-CM | POA: Diagnosis not present

## 2019-01-25 DIAGNOSIS — E58 Dietary calcium deficiency: Secondary | ICD-10-CM | POA: Diagnosis not present

## 2019-01-25 DIAGNOSIS — Z8249 Family history of ischemic heart disease and other diseases of the circulatory system: Secondary | ICD-10-CM | POA: Diagnosis not present

## 2019-01-25 DIAGNOSIS — E785 Hyperlipidemia, unspecified: Secondary | ICD-10-CM | POA: Diagnosis not present

## 2019-01-25 DIAGNOSIS — M5116 Intervertebral disc disorders with radiculopathy, lumbar region: Secondary | ICD-10-CM | POA: Diagnosis present

## 2019-01-25 DIAGNOSIS — M5186 Other intervertebral disc disorders, lumbar region: Secondary | ICD-10-CM | POA: Diagnosis not present

## 2019-01-25 DIAGNOSIS — R52 Pain, unspecified: Secondary | ICD-10-CM | POA: Diagnosis not present

## 2019-01-25 DIAGNOSIS — Z981 Arthrodesis status: Secondary | ICD-10-CM | POA: Diagnosis not present

## 2019-01-25 DIAGNOSIS — M62838 Other muscle spasm: Secondary | ICD-10-CM | POA: Diagnosis not present

## 2019-01-25 DIAGNOSIS — Z7983 Long term (current) use of bisphosphonates: Secondary | ICD-10-CM | POA: Diagnosis not present

## 2019-01-25 DIAGNOSIS — R079 Chest pain, unspecified: Secondary | ICD-10-CM | POA: Diagnosis not present

## 2019-01-25 DIAGNOSIS — I739 Peripheral vascular disease, unspecified: Secondary | ICD-10-CM | POA: Diagnosis present

## 2019-01-25 DIAGNOSIS — M6281 Muscle weakness (generalized): Secondary | ICD-10-CM | POA: Diagnosis not present

## 2019-01-25 DIAGNOSIS — F17210 Nicotine dependence, cigarettes, uncomplicated: Secondary | ICD-10-CM | POA: Diagnosis present

## 2019-01-25 MED ORDER — DEXAMETHASONE SODIUM PHOSPHATE 10 MG/ML IJ SOLN
10.0000 mg | Freq: Once | INTRAMUSCULAR | Status: AC
  Administered 2019-01-25: 10 mg via INTRAVENOUS
  Filled 2019-01-25: qty 1

## 2019-01-25 MED ORDER — TRAMADOL HCL 50 MG PO TABS: 50.0000 mg | ORAL_TABLET | Freq: Four times a day (QID) | ORAL | 0 refills | Status: DC | PRN

## 2019-01-25 MED ORDER — LORAZEPAM 2 MG/ML IJ SOLN
0.5000 mg | Freq: Once | INTRAMUSCULAR | Status: AC
  Administered 2019-01-25: 0.5 mg via INTRAVENOUS
  Filled 2019-01-25: qty 1

## 2019-01-25 MED ORDER — ONDANSETRON HCL 4 MG/2ML IJ SOLN
4.0000 mg | Freq: Once | INTRAMUSCULAR | Status: AC
  Administered 2019-01-25: 4 mg via INTRAVENOUS
  Filled 2019-01-25: qty 2

## 2019-01-25 MED ORDER — PREDNISONE 20 MG PO TABS: 40.0000 mg | ORAL_TABLET | Freq: Every day | ORAL | 0 refills | Status: DC

## 2019-01-25 MED ORDER — KETOROLAC TROMETHAMINE 15 MG/ML IJ SOLN
15.0000 mg | Freq: Once | INTRAMUSCULAR | Status: AC
  Administered 2019-01-25: 15 mg via INTRAVENOUS
  Filled 2019-01-25: qty 1

## 2019-01-25 MED ORDER — HYDROMORPHONE HCL 1 MG/ML IJ SOLN
0.7500 mg | Freq: Once | INTRAMUSCULAR | Status: AC
  Administered 2019-01-25: 0.75 mg via INTRAVENOUS
  Filled 2019-01-25: qty 1

## 2019-01-25 NOTE — ED Notes (Signed)
Patient verbalizes understanding of discharge instructions. Opportunity for questioning and answers were provided. Pt discharged from ED. 

## 2019-01-25 NOTE — Telephone Encounter (Signed)
PLEASE SEE NOTE FROM 5/22 FOR FURTHER DOCUMENTATION

## 2019-01-25 NOTE — Telephone Encounter (Signed)
Start spironolactone 12.5mg  PO qd  In one week come in for BMET.  Would like to see a little bit better BP control.   Candee Furbish, MD

## 2019-01-25 NOTE — ED Triage Notes (Signed)
Pt here from home via EMS for chronic back pain, confirmed herniated disc. Pt sts she couldn't wait any longer for her neurosurgereon to call her back . No rx pain meds, only aleve. Pt ambulatory, lives alone, can perform ADL's without difficulty.

## 2019-01-25 NOTE — ED Provider Notes (Signed)
[place] EMERGENCY DEPARTMENT Provider Note   CSN: [id number] Arrival date & time: 01/25/19  1027    History   Chief Complaint Chief Complaint  Patient presents with  . Back Pain    HPI Regina Ortiz is a 73 y.o. female.     HPI   72yF with lower back pain. Worsening over past several weeks. Denies acute trauma/strain. Pain will radiate into RLE. Initially improved with aleve but now not getting much relief from it. No weakness. No fever or chills. No blood thinners. No prior back surgeries. No urinary complaints. No abdominal pain.   Past Medical History:  Diagnosis Date  . AIN grade I   . Anxiety   . Bruises easily   . Chronic diarrhea   . Chronic hyponatremia   . Colitis   . COPD (chronic obstructive pulmonary disease) ([place])   . Depression   . GERD (gastroesophageal reflux disease)   . History of chronic bronchitis   . History of vulvar dysplasia    serveral recurrency's   . Hypertension   . LBBB (left bundle branch block) W/ PROLONGED PR   CARDIOLOGSIT-  DR Johnsie Cancel-   LOV IN EPIC  . Macrocytosis   . Nocturia   . Osteoporosis   . Seronegative rheumatoid arthritis ([place])   . Short of breath on exertion   . Smokers' cough ([place])   . Venous insufficiency of leg    , Edema  . VIN III (vulvar intraepithelial neoplasia III)    RECURRENT -    Patient Active Problem List   Diagnosis Date Noted  . Chest pain 09/25/2017  . Hypertensive urgency 09/24/2017  . Headache 09/24/2017  . Alcohol use 09/24/2017  . AKI (acute kidney injury) ([place]) 09/24/2017  . Bilateral carotid artery stenosis   . Vulvovaginal candidiasis 10/22/2015  . Mild malnutrition ([place]) 07/07/2013  . Chronic bronchitis ([place]) 07/07/2013  . Tobacco abuse 07/07/2013  . Hypertension   . Short of breath on exertion   . Hemorrhoid   . LBBB (left bundle branch block)   . Anxiety   . Depression   . RA (rheumatoid arthritis) ([place])   . Venous insufficiency of leg   . Chronic  hyponatremia   . AIN grade I 04/05/2013  . Diarrhea 09/22/2012  . Hyponatremia 09/22/2012  . Hypokalemia 09/22/2012  . ARF (acute renal failure) ([place]) 09/22/2012  . Atypical chest pain 06/22/2012  . Anemia 06/22/2012  . Vulvar intraepithelial neoplasia III (VIN III) 01/29/2012    Past Surgical History:  Procedure Laterality Date  . APPENDECTOMY  1992  . CARDIOVASCULAR STRESS TEST  06-23-2012  DR Jenkins Rouge   NORMAL LEXISCAN MYOVIEW/ EF 83%/  NO ISCHEMIA  . CO2 LASER APPLICATION  08/67/6195   Procedure: CO2 LASER APPLICATION;  Surgeon: Janie Morning, MD PHD;  Location[facility];  Service: Gynecology;  Laterality: N/A;  Laser Vaporization  . CO2 LASER APPLICATION N/A 0/05/3266   Procedure: CO2 LASER APPLICATION;  Surgeon: [name], MD;  Location: [facility];  Service: Gynecology;  Laterality: N/A;  . CO2 LASER APPLICATION N/A 09/25/5807   Procedure: CO2 LASER APPLICATION of the vulva;  Surgeon: Janie Morning, MD;  Location: [facility];  Service: Gynecology;  Laterality: N/A;  . EXCISION VULVAR LESIONS  07/2012  . HEMORRHOID SURGERY N/A 04/15/2013   Procedure: Excision of external and internal  hemorrhoid with AIN, Exam under anesthesia;  Surgeon: Odis Hollingshead, MD;  Location: [facility];  Service: General;  Laterality: N/A;  . REPAIR FISTULA IN ANO/  I & D PERIRECTAL ABSCESS  10-26-2002  DR Johnathan Hausen  . TRANSTHORACIC ECHOCARDIOGRAM  06-23-2012   NORMAL LVSF/ EF 55-60%  . VAGINAL HYSTERECTOMY  1997   partial  . VULVAR LESION REMOVAL N/A 11/03/2014   Procedure: CO2 LASER OF VULVAR ;  Surgeon: [name], MD;  Location: [facility];  Service: Gynecology;  Laterality: N/A;  . VULVECTOMY N/A 03/26/2015   Procedure: WIDE LOCA  EXCISION OF VULVAR;  Surgeon: [name], MD;  Location: [place];  Service: Gynecology;  Laterality: N/A;  . Wide Local Excision of labia majora  04-04-2010   Right-sided lesion,  CO2 ablation of right labia minora     OB History   No obstetric history on file.      Home Medications    Prior to Admission medications   Medication Sig Start Date End Date Taking? Authorizing Provider  alendronate (FOSAMAX) 70 MG tablet Take 70 mg by mouth once a week. 11/29/18   [provider]  ALPRAZolam Duanne Moron) 0.5 MG tablet Take 0.5 mg by mouth 2 (two) times daily as needed.  11/08/17   [provider]  amoxicillin-clavulanate (AUGMENTIN) 875-125 MG tablet Take 1 tablet by mouth every 12 (twelve) hours. 09/12/18   Maudie Flakes, MD  atorvastatin (LIPITOR) 40 MG tablet Take 1 tablet by mouth once daily 12/22/18   Jerline Pain, MD  busPIRone (BUSPAR) 30 MG tablet Take 30 mg by mouth 2 (two) times daily.  11/20/14   [provider]  Calcium Carbonate-Vitamin D (CALTRATE 600+D PO) Take 1 tablet by mouth daily.     [provider]  Cholecalciferol (VITAMIN D3) 1000 UNITS CAPS Take 1,000 Units by mouth daily.     [provider]  diltiazem (TIAZAC) 360 MG 24 hr capsule Take 360 mg by mouth daily.     [provider]  hydrALAZINE (APRESOLINE) 50 MG tablet Take 50 mg by mouth 3 (three) times daily. 07/16/18   [provider]  Hydroxychloroquine Sulfate (PLAQUENIL PO) Take 200 mg by mouth daily.     [provider]  losartan (COZAAR) 100 MG tablet Take 100 mg by mouth daily.  10/23/17   [provider]  metoprolol tartrate (LOPRESSOR) 100 MG tablet Take 1 tablet (100 mg total) by mouth once for 1 dose. Take 1 tablet 2 hours before your CT scan 01/12/19 01/12/19  Jerline Pain, MD  oxyCODONE (ROXICODONE) 5 MG immediate release tablet Take 1 tablet (5 mg total) by mouth every 4 (four) hours as needed for severe pain. 09/12/18   Maudie Flakes, MD  Potassium (POTASSIMIN PO) Take 1 tablet by mouth every evening.     [provider]  predniSONE (DELTASONE) 5 MG tablet Take 5 mg by mouth daily with breakfast.  --  LONG-TERM USE FOR RA    [provider]  saccharomyces boulardii (FLORASTOR) 250 MG capsule Take 250 mg by mouth daily.    [provider]  traZODone (DESYREL) 50 MG tablet Take 50 mg by mouth at bedtime.  11/16/17   [provider]    Family History Family History  Problem Relation Age of Onset  . Diabetes Mother   . Hypertension Father   . Lymphoma Son   . Cancer Son        lymphoblastic lymphoma    Social History Social History   Tobacco Use  . Smoking status:  Current Every Day Smoker    Packs/day: 0.25    Years: 50.00    Pack years: 12.50    Types: Cigarettes  . Smokeless tobacco: Never Used  Substance Use Topics  . Alcohol use: Yes    Alcohol/week: 2.0 standard drinks    Types: 2 Cans of beer per week  . Drug use: No     Allergies   Latex; Doxycycline; Lactose intolerance (gi); Lialda [mesalamine]; Prinivil [lisinopril]; Triamcinolone acetonide; Amlodipine; and Lotensin [benazepril hcl]   Review of Systems Review of Systems  All systems reviewed and negative, other than as noted in HPI.  Physical Exam Updated Vital Signs BP (!) 194/75   Pulse 84   Temp 99.3 F (37.4 C) (Oral)   Resp 18   LMP  (LMP Unknown)   SpO2 97%   Physical Exam Vitals signs and nursing note reviewed.  Constitutional:      General: She is not in acute distress.    Appearance: She is well-developed.  HENT:     Head: Normocephalic and atraumatic.  Eyes:     General:        Right eye: No discharge.        Left eye: No discharge.     Conjunctiva/sclera: Conjunctivae normal.  Neck:     Musculoskeletal: Neck supple.  Cardiovascular:     Rate and Rhythm: Normal rate and regular rhythm.     Heart sounds: Normal heart sounds. No murmur. No friction rub. No gallop.   Pulmonary:     Effort: Pulmonary effort is normal. No respiratory distress.     Breath sounds: Normal breath sounds.  Abdominal:     General: There is no distension.     Palpations:  Abdomen is soft.     Tenderness: There is no abdominal tenderness.  Musculoskeletal:        General: No tenderness.     Comments: Back pain is not reproducible. Normal strength and sensation in b/l LE.   Skin:    General: Skin is warm and dry.  Neurological:     Mental Status: She is alert.  Psychiatric:        Behavior: Behavior normal.        Thought Content: Thought content normal.      ED Treatments / Results  Labs (all labs ordered are listed, but only abnormal results are displayed) Labs Reviewed - No data to display  EKG EKG Interpretation  Date/Time:  Tuesday Jan 25 2019 10:31:24 EDT Ventricular Rate:  90 PR Interval:    QRS Duration: 143 QT Interval:  418 QTC Calculation: 512 R Axis:   45 Text Interpretation:  Sinus rhythm IVCD, consider atypical LBBB Confirmed by Virgel Manifold [phone]) on 01/25/2019 10:40:41 AM   Radiology No results found.  Procedures Procedures (including critical care time)  Medications Ordered in ED Medications  HYDROmorphone (DILAUDID) injection 0.75 mg (has no administration in time range)  LORazepam (ATIVAN) injection 0.5 mg (has no administration in time range)  ketorolac (TORADOL) 15 MG/ML injection 15 mg (15 mg Intravenous Given 01/25/19 1122)  dexamethasone (DECADRON) injection 10 mg (10 mg Intravenous Given 01/25/19 1124)     Initial Impression / Assessment and Plan / ED Course  I have reviewed the triage vital signs and the nursing notes.  Pertinent labs & imaging results that were available during my care of the patient were reviewed by me and considered in my medical decision making (see chart for details).  72yF with lower back pain w/o overt red flags. Pain symptomatic tx and reassessment.   Symptoms improved. Plan continued symptomatic tx. outpt FU. Emergent return precautions discussed.   Final Clinical Impressions(s) / ED Diagnoses   Final diagnoses:  Acute low back pain with right-sided sciatica,  unspecified back pain laterality    ED Discharge Orders    None       Virgel Manifold, MD 01/26/19 1250

## 2019-01-25 NOTE — Telephone Encounter (Signed)
Please see other note. I have ordered spironolactone 12.5mg  PO QD with labs in one week. Pain and anxiety are playing a role as well in BP.  Candee Furbish, MD

## 2019-01-25 NOTE — Telephone Encounter (Signed)
Bonnita Levan, MD  You 35 minutes ago (5:20 PM)      Please see other note. I have ordered spironolactone 12.5mg  PO QD with labs in one week. Pain and anxiety are playing a role as well in BP.  Candee Furbish, MD       Documentation

## 2019-01-25 NOTE — Telephone Encounter (Signed)
Will forward to Dr Skains for his review °

## 2019-01-25 NOTE — Telephone Encounter (Signed)
Patient wants to let Dr Marlou Porch know that she has been at the ER and is home now. She went to the ER for low back pain and blood pressure. She would like for him to look at the notes from her visit.

## 2019-01-25 NOTE — ED Notes (Signed)
Pt given beverage per Dr. Wilson Singer

## 2019-01-26 MED ORDER — SPIRONOLACTONE 25 MG PO TABS: 12.5000 mg | ORAL_TABLET | Freq: Every day | ORAL | 3 refills | Status: DC

## 2019-01-26 NOTE — Telephone Encounter (Signed)
Pt aware of instructions, RX sent into pharmacy, lab ordered and scheduled.

## 2019-01-27 ENCOUNTER — Other Ambulatory Visit: Payer: Self-pay | Admitting: Neurosurgery

## 2019-01-27 DIAGNOSIS — M5126 Other intervertebral disc displacement, lumbar region: Secondary | ICD-10-CM | POA: Diagnosis not present

## 2019-01-28 ENCOUNTER — Other Ambulatory Visit: Payer: Self-pay

## 2019-01-28 ENCOUNTER — Inpatient Hospital Stay (HOSPITAL_COMMUNITY)
Admission: EM | Admit: 2019-01-28 | Discharge: 2019-02-03 | DRG: 454 | Disposition: A | Discharge status: Skilled Nursing Facility | Payer: PPO | Attending: Neurological Surgery | Admitting: Neurological Surgery

## 2019-01-28 ENCOUNTER — Other Ambulatory Visit: Payer: PPO

## 2019-01-28 ENCOUNTER — Emergency Department (HOSPITAL_COMMUNITY): Payer: PPO | Admitting: Certified Registered"

## 2019-01-28 ENCOUNTER — Emergency Department (HOSPITAL_COMMUNITY): Payer: PPO

## 2019-01-28 ENCOUNTER — Encounter (HOSPITAL_COMMUNITY)
Admission: EM | Disposition: A | Discharge status: Skilled Nursing Facility | Payer: Self-pay | Source: Home / Self Care | Attending: Neurological Surgery

## 2019-01-28 ENCOUNTER — Encounter (HOSPITAL_COMMUNITY): Payer: Self-pay | Admitting: Emergency Medicine

## 2019-01-28 DIAGNOSIS — I739 Peripheral vascular disease, unspecified: Secondary | ICD-10-CM | POA: Diagnosis present

## 2019-01-28 DIAGNOSIS — Z1159 Encounter for screening for other viral diseases: Secondary | ICD-10-CM

## 2019-01-28 DIAGNOSIS — J449 Chronic obstructive pulmonary disease, unspecified: Secondary | ICD-10-CM | POA: Diagnosis present

## 2019-01-28 DIAGNOSIS — M47816 Spondylosis without myelopathy or radiculopathy, lumbar region: Secondary | ICD-10-CM | POA: Diagnosis present

## 2019-01-28 DIAGNOSIS — Z7952 Long term (current) use of systemic steroids: Secondary | ICD-10-CM | POA: Diagnosis not present

## 2019-01-28 DIAGNOSIS — M48061 Spinal stenosis, lumbar region without neurogenic claudication: Secondary | ICD-10-CM | POA: Diagnosis present

## 2019-01-28 DIAGNOSIS — R29898 Other symptoms and signs involving the musculoskeletal system: Secondary | ICD-10-CM

## 2019-01-28 DIAGNOSIS — F17210 Nicotine dependence, cigarettes, uncomplicated: Secondary | ICD-10-CM | POA: Diagnosis present

## 2019-01-28 DIAGNOSIS — G834 Cauda equina syndrome: Secondary | ICD-10-CM | POA: Diagnosis present

## 2019-01-28 DIAGNOSIS — Z8249 Family history of ischemic heart disease and other diseases of the circulatory system: Secondary | ICD-10-CM

## 2019-01-28 DIAGNOSIS — Z419 Encounter for procedure for purposes other than remedying health state, unspecified: Secondary | ICD-10-CM

## 2019-01-28 DIAGNOSIS — M810 Age-related osteoporosis without current pathological fracture: Secondary | ICD-10-CM | POA: Diagnosis present

## 2019-01-28 DIAGNOSIS — Z833 Family history of diabetes mellitus: Secondary | ICD-10-CM

## 2019-01-28 DIAGNOSIS — M0600 Rheumatoid arthritis without rheumatoid factor, unspecified site: Secondary | ICD-10-CM | POA: Diagnosis present

## 2019-01-28 DIAGNOSIS — R339 Retention of urine, unspecified: Secondary | ICD-10-CM | POA: Diagnosis present

## 2019-01-28 DIAGNOSIS — M5116 Intervertebral disc disorders with radiculopathy, lumbar region: Secondary | ICD-10-CM | POA: Diagnosis present

## 2019-01-28 DIAGNOSIS — Z7983 Long term (current) use of bisphosphonates: Secondary | ICD-10-CM

## 2019-01-28 DIAGNOSIS — I10 Essential (primary) hypertension: Secondary | ICD-10-CM | POA: Diagnosis present

## 2019-01-28 DIAGNOSIS — M5442 Lumbago with sciatica, left side: Secondary | ICD-10-CM

## 2019-01-28 LAB — CBC WITH DIFFERENTIAL/PLATELET
Abs Immature Granulocytes: 0.02 10*3/uL (ref 0.00–0.07)
Basophils Absolute: 0 10*3/uL (ref 0.0–0.1)
Basophils Relative: 0 %
Eosinophils Absolute: 0.2 10*3/uL (ref 0.0–0.5)
Eosinophils Relative: 2 %
HCT: 34.6 % — ABNORMAL LOW (ref 36.0–46.0)
Hemoglobin: 12.3 g/dL (ref 12.0–15.0)
Immature Granulocytes: 0 %
Lymphocytes Relative: 12 %
Lymphs Abs: 1.1 10*3/uL (ref 0.7–4.0)
MCH: 35.7 pg — ABNORMAL HIGH (ref 26.0–34.0)
MCHC: 35.5 g/dL (ref 30.0–36.0)
MCV: 100.3 fL — ABNORMAL HIGH (ref 80.0–100.0)
Monocytes Absolute: 0.8 10*3/uL (ref 0.1–1.0)
Monocytes Relative: 8 %
Neutro Abs: 7.5 10*3/uL (ref 1.7–7.7)
Neutrophils Relative %: 78 %
Platelets: 190 10*3/uL (ref 150–400)
RBC: 3.45 MIL/uL — ABNORMAL LOW (ref 3.87–5.11)
RDW: 13.2 % (ref 11.5–15.5)
WBC: 9.6 10*3/uL (ref 4.0–10.5)
nRBC: 0 % (ref 0.0–0.2)

## 2019-01-28 LAB — SARS CORONAVIRUS 2 BY RT PCR (HOSPITAL ORDER, PERFORMED IN ~~LOC~~ HOSPITAL LAB): SARS Coronavirus 2: NEGATIVE

## 2019-01-28 LAB — URINALYSIS, ROUTINE W REFLEX MICROSCOPIC
Bacteria, UA: NONE SEEN
Bilirubin Urine: NEGATIVE
Glucose, UA: NEGATIVE mg/dL
Hgb urine dipstick: NEGATIVE
Ketones, ur: NEGATIVE mg/dL
Leukocytes,Ua: NEGATIVE
Nitrite: NEGATIVE
Protein, ur: 100 mg/dL — AB
Specific Gravity, Urine: 1.006 (ref 1.005–1.030)
pH: 7 (ref 5.0–8.0)

## 2019-01-28 LAB — BASIC METABOLIC PANEL
Anion gap: 12 (ref 5–15)
BUN: 17 mg/dL (ref 8–23)
CO2: 22 mmol/L (ref 22–32)
Calcium: 9.5 mg/dL (ref 8.9–10.3)
Chloride: 95 mmol/L — ABNORMAL LOW (ref 98–111)
Creatinine, Ser: 1.08 mg/dL — ABNORMAL HIGH (ref 0.44–1.00)
GFR calc Af Amer: 59 mL/min — ABNORMAL LOW (ref >60)
GFR calc non Af Amer: 51 mL/min — ABNORMAL LOW (ref >60)
Glucose, Bld: 98 mg/dL (ref 70–99)
Potassium: 3.6 mmol/L (ref 3.5–5.1)
Sodium: 129 mmol/L — ABNORMAL LOW (ref 135–145)

## 2019-01-28 LAB — TYPE AND SCREEN
ABO/RH(D): A POS
Antibody Screen: NEGATIVE

## 2019-01-28 LAB — ABO/RH: ABO/RH(D): A POS

## 2019-01-28 SURGERY — POSTERIOR LUMBAR FUSION 1 LEVEL
Anesthesia: General | Site: Back

## 2019-01-28 MED ORDER — HYDROCODONE-ACETAMINOPHEN 5-325 MG PO TABS
1.0000 | ORAL_TABLET | ORAL | Status: DC | PRN
  Administered 2019-01-29: 1 via ORAL
  Administered 2019-01-29 – 2019-02-03 (×18): 2 via ORAL
  Filled 2019-01-28 (×3): qty 2
  Filled 2019-01-28: qty 1
  Filled 2019-01-28 (×16): qty 2

## 2019-01-28 MED ORDER — 0.9 % SODIUM CHLORIDE (POUR BTL) OPTIME
TOPICAL | Status: DC | PRN
  Administered 2019-01-28: 1000 mL

## 2019-01-28 MED ORDER — ACETAMINOPHEN 650 MG RE SUPP: 650.0000 mg | RECTAL | Status: DC | PRN

## 2019-01-28 MED ORDER — FENTANYL CITRATE (PF) 100 MCG/2ML IJ SOLN
INTRAMUSCULAR | Status: DC | PRN
  Administered 2019-01-28 (×4): 50 ug via INTRAVENOUS

## 2019-01-28 MED ORDER — FENTANYL CITRATE (PF) 250 MCG/5ML IJ SOLN
INTRAMUSCULAR | Status: AC
  Filled 2019-01-28: qty 5

## 2019-01-28 MED ORDER — SODIUM CHLORIDE 0.9 % IV SOLN: 250.0000 mL | INTRAVENOUS | Status: DC

## 2019-01-28 MED ORDER — DEXAMETHASONE SODIUM PHOSPHATE 10 MG/ML IJ SOLN
INTRAMUSCULAR | Status: DC | PRN
  Administered 2019-01-28: 8 mg via INTRAVENOUS

## 2019-01-28 MED ORDER — DEXAMETHASONE SODIUM PHOSPHATE 10 MG/ML IJ SOLN
INTRAMUSCULAR | Status: AC
  Filled 2019-01-28: qty 1

## 2019-01-28 MED ORDER — ALUM & MAG HYDROXIDE-SIMETH 200-200-20 MG/5ML PO SUSP: 30.0000 mL | Freq: Four times a day (QID) | ORAL | Status: DC | PRN

## 2019-01-28 MED ORDER — ONDANSETRON HCL 4 MG/2ML IJ SOLN: 4.0000 mg | Freq: Four times a day (QID) | INTRAMUSCULAR | Status: DC | PRN

## 2019-01-28 MED ORDER — ONDANSETRON HCL 4 MG/2ML IJ SOLN: 4.0000 mg | Freq: Once | INTRAMUSCULAR | Status: DC | PRN

## 2019-01-28 MED ORDER — MORPHINE SULFATE (PF) 2 MG/ML IV SOLN
2.0000 mg | INTRAVENOUS | Status: DC | PRN
  Administered 2019-02-02: 2 mg via INTRAVENOUS
  Filled 2019-01-28: qty 1

## 2019-01-28 MED ORDER — OXYCODONE HCL 5 MG PO TABS
ORAL_TABLET | ORAL | Status: AC
  Filled 2019-01-28: qty 1

## 2019-01-28 MED ORDER — THROMBIN 5000 UNITS EX SOLR
CUTANEOUS | Status: AC
  Filled 2019-01-28: qty 5000

## 2019-01-28 MED ORDER — HYDROXYCHLOROQUINE SULFATE 200 MG PO TABS
200.0000 mg | ORAL_TABLET | Freq: Every day | ORAL | Status: DC
  Administered 2019-01-29 – 2019-02-03 (×6): 200 mg via ORAL
  Filled 2019-01-28 (×6): qty 1

## 2019-01-28 MED ORDER — SODIUM CHLORIDE 0.9 % IV BOLUS
1000.0000 mL | Freq: Once | INTRAVENOUS | Status: AC
  Administered 2019-01-28: 15:00:00 1000 mL via INTRAVENOUS

## 2019-01-28 MED ORDER — LIDOCAINE 2% (20 MG/ML) 5 ML SYRINGE
INTRAMUSCULAR | Status: AC
  Filled 2019-01-28: qty 5

## 2019-01-28 MED ORDER — SODIUM CHLORIDE 0.9 % IV BOLUS: 1000.0000 mL | Freq: Once | INTRAVENOUS | Status: DC

## 2019-01-28 MED ORDER — SODIUM CHLORIDE 0.9% FLUSH
3.0000 mL | Freq: Two times a day (BID) | INTRAVENOUS | Status: DC
  Administered 2019-01-29 – 2019-02-02 (×6): 3 mL via INTRAVENOUS

## 2019-01-28 MED ORDER — PROPOFOL 10 MG/ML IV BOLUS
INTRAVENOUS | Status: DC | PRN
  Administered 2019-01-28: 80 mg via INTRAVENOUS

## 2019-01-28 MED ORDER — MIDAZOLAM HCL 2 MG/2ML IJ SOLN
INTRAMUSCULAR | Status: AC
  Filled 2019-01-28: qty 2

## 2019-01-28 MED ORDER — FLEET ENEMA 7-19 GM/118ML RE ENEM: 1.0000 | ENEMA | Freq: Once | RECTAL | Status: DC | PRN

## 2019-01-28 MED ORDER — VANCOMYCIN HCL 1000 MG IV SOLR
INTRAVENOUS | Status: DC | PRN
  Administered 2019-01-28: 1000 mg via INTRAVENOUS

## 2019-01-28 MED ORDER — SODIUM CHLORIDE 0.9 % IV SOLN
INTRAVENOUS | Status: DC | PRN
  Administered 2019-01-28: 500 mL

## 2019-01-28 MED ORDER — PHENOL 1.4 % MT LIQD: 1.0000 | OROMUCOSAL | Status: DC | PRN

## 2019-01-28 MED ORDER — SENNA 8.6 MG PO TABS
1.0000 | ORAL_TABLET | Freq: Two times a day (BID) | ORAL | Status: DC
  Administered 2019-01-29 – 2019-02-03 (×9): 8.6 mg via ORAL
  Filled 2019-01-28 (×10): qty 1

## 2019-01-28 MED ORDER — SACCHAROMYCES BOULARDII 250 MG PO CAPS
250.0000 mg | ORAL_CAPSULE | Freq: Every day | ORAL | Status: DC
  Administered 2019-01-29 – 2019-02-03 (×6): 250 mg via ORAL
  Filled 2019-01-28 (×5): qty 1

## 2019-01-28 MED ORDER — HYDROMORPHONE HCL 1 MG/ML IJ SOLN: 0.2500 mg | INTRAMUSCULAR | Status: DC | PRN

## 2019-01-28 MED ORDER — MEPERIDINE HCL 25 MG/ML IJ SOLN: 6.2500 mg | INTRAMUSCULAR | Status: DC | PRN

## 2019-01-28 MED ORDER — KETOROLAC TROMETHAMINE 15 MG/ML IJ SOLN
INTRAMUSCULAR | Status: AC
  Filled 2019-01-28: qty 1

## 2019-01-28 MED ORDER — SPIRONOLACTONE 12.5 MG HALF TABLET
12.5000 mg | ORAL_TABLET | Freq: Every day | ORAL | Status: DC
  Administered 2019-01-29 – 2019-02-03 (×6): 12.5 mg via ORAL
  Filled 2019-01-28 (×6): qty 1

## 2019-01-28 MED ORDER — POTASSIUM CHLORIDE CRYS ER 10 MEQ PO TBCR
5.0000 meq | EXTENDED_RELEASE_TABLET | Freq: Every day | ORAL | Status: DC
  Administered 2019-02-01 (×2): 5 meq via ORAL
  Filled 2019-01-28 (×3): qty 1

## 2019-01-28 MED ORDER — OXYCODONE HCL 5 MG/5ML PO SOLN: 5.0000 mg | Freq: Once | ORAL | Status: AC | PRN

## 2019-01-28 MED ORDER — ACETAMINOPHEN 325 MG PO TABS
650.0000 mg | ORAL_TABLET | ORAL | Status: DC | PRN
  Administered 2019-01-29 – 2019-02-03 (×4): 650 mg via ORAL
  Filled 2019-01-28 (×4): qty 2

## 2019-01-28 MED ORDER — ROCURONIUM BROMIDE 10 MG/ML (PF) SYRINGE
PREFILLED_SYRINGE | INTRAVENOUS | Status: AC
  Filled 2019-01-28: qty 10

## 2019-01-28 MED ORDER — LACTATED RINGERS IV SOLN
INTRAVENOUS | Status: DC
  Administered 2019-01-28 (×2): via INTRAVENOUS

## 2019-01-28 MED ORDER — BUSPIRONE HCL 5 MG PO TABS
30.0000 mg | ORAL_TABLET | Freq: Two times a day (BID) | ORAL | Status: DC
  Administered 2019-01-29 – 2019-02-03 (×11): 30 mg via ORAL
  Filled 2019-01-28 (×11): qty 6

## 2019-01-28 MED ORDER — LOSARTAN POTASSIUM 50 MG PO TABS
100.0000 mg | ORAL_TABLET | Freq: Every day | ORAL | Status: DC
  Administered 2019-01-29 – 2019-02-03 (×6): 100 mg via ORAL
  Filled 2019-01-28 (×6): qty 2

## 2019-01-28 MED ORDER — ATORVASTATIN CALCIUM 40 MG PO TABS
40.0000 mg | ORAL_TABLET | Freq: Every day | ORAL | Status: DC
  Administered 2019-01-29 – 2019-02-03 (×6): 40 mg via ORAL
  Filled 2019-01-28 (×6): qty 1

## 2019-01-28 MED ORDER — ONDANSETRON HCL 4 MG/2ML IJ SOLN
INTRAMUSCULAR | Status: AC
  Filled 2019-01-28: qty 2

## 2019-01-28 MED ORDER — VANCOMYCIN HCL IN DEXTROSE 1-5 GM/200ML-% IV SOLN
INTRAVENOUS | Status: AC
  Filled 2019-01-28: qty 200

## 2019-01-28 MED ORDER — SODIUM CHLORIDE 0.9% FLUSH: 3.0000 mL | INTRAVENOUS | Status: DC | PRN

## 2019-01-28 MED ORDER — BUPIVACAINE HCL (PF) 0.5 % IJ SOLN
INTRAMUSCULAR | Status: AC
  Filled 2019-01-28: qty 30

## 2019-01-28 MED ORDER — BUPIVACAINE HCL (PF) 0.5 % IJ SOLN
INTRAMUSCULAR | Status: DC | PRN
  Administered 2019-01-28: 3.5 mL
  Administered 2019-01-28: 20 mL

## 2019-01-28 MED ORDER — ALPRAZOLAM 0.5 MG PO TABS
0.5000 mg | ORAL_TABLET | Freq: Two times a day (BID) | ORAL | Status: DC | PRN
  Administered 2019-01-29 – 2019-02-03 (×5): 0.5 mg via ORAL
  Filled 2019-01-28 (×5): qty 1

## 2019-01-28 MED ORDER — MORPHINE SULFATE (PF) 4 MG/ML IV SOLN
4.0000 mg | Freq: Once | INTRAVENOUS | Status: AC
  Administered 2019-01-28: 4 mg via INTRAVENOUS
  Filled 2019-01-28: qty 1

## 2019-01-28 MED ORDER — CYCLOBENZAPRINE HCL 10 MG PO TABS
ORAL_TABLET | ORAL | Status: AC
  Filled 2019-01-28: qty 1

## 2019-01-28 MED ORDER — ONDANSETRON HCL 4 MG/2ML IJ SOLN
4.0000 mg | Freq: Once | INTRAMUSCULAR | Status: AC
  Administered 2019-01-28: 4 mg via INTRAVENOUS
  Filled 2019-01-28: qty 2

## 2019-01-28 MED ORDER — METOPROLOL TARTRATE 50 MG PO TABS: 100.0000 mg | ORAL_TABLET | Freq: Once | ORAL | Status: DC

## 2019-01-28 MED ORDER — DOCUSATE SODIUM 100 MG PO CAPS
100.0000 mg | ORAL_CAPSULE | Freq: Two times a day (BID) | ORAL | Status: DC
  Administered 2019-01-29 – 2019-02-03 (×9): 100 mg via ORAL
  Filled 2019-01-28 (×10): qty 1

## 2019-01-28 MED ORDER — THROMBIN 20000 UNITS EX SOLR
CUTANEOUS | Status: AC
  Filled 2019-01-28: qty 20000

## 2019-01-28 MED ORDER — MIDAZOLAM HCL 5 MG/5ML IJ SOLN
INTRAMUSCULAR | Status: DC | PRN
  Administered 2019-01-28 (×2): 1 mg via INTRAVENOUS

## 2019-01-28 MED ORDER — LIDOCAINE-EPINEPHRINE 1 %-1:100000 IJ SOLN
INTRAMUSCULAR | Status: AC
  Filled 2019-01-28: qty 1

## 2019-01-28 MED ORDER — OXYCODONE HCL 5 MG PO TABS
5.0000 mg | ORAL_TABLET | Freq: Once | ORAL | Status: AC | PRN
  Administered 2019-01-28: 5 mg via ORAL

## 2019-01-28 MED ORDER — HYDROMORPHONE HCL 1 MG/ML IJ SOLN
1.0000 mg | Freq: Once | INTRAMUSCULAR | Status: AC
  Administered 2019-01-28: 1 mg via INTRAVENOUS
  Filled 2019-01-28: qty 1

## 2019-01-28 MED ORDER — HYDRALAZINE HCL 50 MG PO TABS
50.0000 mg | ORAL_TABLET | Freq: Three times a day (TID) | ORAL | Status: DC
  Administered 2019-01-28 – 2019-02-03 (×17): 50 mg via ORAL
  Filled 2019-01-28 (×17): qty 1

## 2019-01-28 MED ORDER — KETOROLAC TROMETHAMINE 15 MG/ML IJ SOLN
7.5000 mg | Freq: Four times a day (QID) | INTRAMUSCULAR | Status: AC
  Administered 2019-01-28 – 2019-01-29 (×5): 7.5 mg via INTRAVENOUS
  Filled 2019-01-28 (×4): qty 1

## 2019-01-28 MED ORDER — BISACODYL 10 MG RE SUPP
10.0000 mg | Freq: Every day | RECTAL | Status: DC | PRN
  Administered 2019-02-01: 01:00:00 10 mg via RECTAL
  Filled 2019-01-28: qty 1

## 2019-01-28 MED ORDER — THROMBIN 20000 UNITS EX SOLR
CUTANEOUS | Status: DC | PRN
  Administered 2019-01-28: 19:00:00 20 mL via TOPICAL

## 2019-01-28 MED ORDER — OXYCODONE HCL 5 MG/5ML PO SOLN: 5.0000 mg | Freq: Once | ORAL | Status: DC | PRN

## 2019-01-28 MED ORDER — THROMBIN 5000 UNITS EX SOLR
OROMUCOSAL | Status: DC | PRN
  Administered 2019-01-28: 19:00:00 5 mL via TOPICAL

## 2019-01-28 MED ORDER — MENTHOL 3 MG MT LOZG: 1.0000 | LOZENGE | OROMUCOSAL | Status: DC | PRN

## 2019-01-28 MED ORDER — POLYETHYLENE GLYCOL 3350 17 G PO PACK: 17.0000 g | PACK | Freq: Every day | ORAL | Status: DC | PRN

## 2019-01-28 MED ORDER — ROCURONIUM BROMIDE 10 MG/ML (PF) SYRINGE
PREFILLED_SYRINGE | INTRAVENOUS | Status: DC | PRN
  Administered 2019-01-28 (×2): 10 mg via INTRAVENOUS
  Administered 2019-01-28: 50 mg via INTRAVENOUS

## 2019-01-28 MED ORDER — CYCLOBENZAPRINE HCL 10 MG PO TABS
10.0000 mg | ORAL_TABLET | Freq: Three times a day (TID) | ORAL | Status: DC | PRN
  Administered 2019-01-28 – 2019-02-03 (×13): 10 mg via ORAL
  Filled 2019-01-28 (×12): qty 1

## 2019-01-28 MED ORDER — LACTATED RINGERS IV SOLN
INTRAVENOUS | Status: DC
  Administered 2019-01-28 – 2019-01-29 (×2): via INTRAVENOUS

## 2019-01-28 MED ORDER — PREDNISONE 20 MG PO TABS
40.0000 mg | ORAL_TABLET | Freq: Every day | ORAL | Status: DC
  Administered 2019-01-29 – 2019-02-03 (×6): 40 mg via ORAL
  Filled 2019-01-28 (×6): qty 2

## 2019-01-28 MED ORDER — TRAZODONE HCL 100 MG PO TABS
100.0000 mg | ORAL_TABLET | Freq: Every day | ORAL | Status: DC
  Administered 2019-01-29 – 2019-02-02 (×5): 100 mg via ORAL
  Filled 2019-01-28 (×5): qty 1

## 2019-01-28 MED ORDER — FENTANYL CITRATE (PF) 100 MCG/2ML IJ SOLN: 25.0000 ug | INTRAMUSCULAR | Status: DC | PRN

## 2019-01-28 MED ORDER — DILTIAZEM HCL ER BEADS 240 MG PO CP24
360.0000 mg | ORAL_CAPSULE | Freq: Every day | ORAL | Status: DC
  Filled 2019-01-28: qty 1

## 2019-01-28 MED ORDER — LIDOCAINE 2% (20 MG/ML) 5 ML SYRINGE
INTRAMUSCULAR | Status: DC | PRN
  Administered 2019-01-28: 100 mg via INTRAVENOUS

## 2019-01-28 MED ORDER — PROPOFOL 10 MG/ML IV BOLUS
INTRAVENOUS | Status: AC
  Filled 2019-01-28: qty 20

## 2019-01-28 MED ORDER — OXYCODONE HCL 5 MG PO TABS: 5.0000 mg | ORAL_TABLET | Freq: Once | ORAL | Status: DC | PRN

## 2019-01-28 MED ORDER — ONDANSETRON HCL 4 MG/2ML IJ SOLN
INTRAMUSCULAR | Status: DC | PRN
  Administered 2019-01-28: 4 mg via INTRAVENOUS

## 2019-01-28 MED ORDER — LIDOCAINE-EPINEPHRINE 1 %-1:100000 IJ SOLN
INTRAMUSCULAR | Status: DC | PRN
  Administered 2019-01-28: 3.5 mL

## 2019-01-28 MED ORDER — ONDANSETRON HCL 4 MG PO TABS: 4.0000 mg | ORAL_TABLET | Freq: Four times a day (QID) | ORAL | Status: DC | PRN

## 2019-01-28 SURGICAL SUPPLY — 64 items
BAG DECANTER FOR FLEXI CONT (MISCELLANEOUS) ×3 IMPLANT
BASKET BONE COLLECTION (BASKET) ×3 IMPLANT
BLADE CLIPPER SURG (BLADE) IMPLANT
BONE CANC CHIPS 20CC PCAN1/4 (Bone Implant) ×3 IMPLANT
BUR MATCHSTICK NEURO 3.0 LAGG (BURR) ×3 IMPLANT
CAGE MAS PLIF 9X9X23-8 LUMBAR (Cage) ×6 IMPLANT
CANISTER SUCT 3000ML PPV (MISCELLANEOUS) ×3 IMPLANT
CHIPS CANC BONE 20CC PCAN1/4 (Bone Implant) ×1 IMPLANT
CONT SPEC 4OZ CLIKSEAL STRL BL (MISCELLANEOUS) ×3 IMPLANT
COVER BACK TABLE 60X90IN (DRAPES) ×3 IMPLANT
COVER WAND RF STERILE (DRAPES) ×3 IMPLANT
DECANTER SPIKE VIAL GLASS SM (MISCELLANEOUS) ×3 IMPLANT
DERMABOND ADVANCED (GAUZE/BANDAGES/DRESSINGS) ×2
DERMABOND ADVANCED .7 DNX12 (GAUZE/BANDAGES/DRESSINGS) ×1 IMPLANT
DEVICE DISSECT PLASMABLAD 3.0S (MISCELLANEOUS) ×1 IMPLANT
DRAPE C-ARM 42X72 X-RAY (DRAPES) ×6 IMPLANT
DRAPE HALF SHEET 40X57 (DRAPES) IMPLANT
DRAPE LAPAROTOMY 100X72X124 (DRAPES) ×3 IMPLANT
DURAPREP 26ML APPLICATOR (WOUND CARE) ×3 IMPLANT
DURASEAL APPLICATOR TIP (TIP) IMPLANT
DURASEAL SPINE SEALANT 3ML (MISCELLANEOUS) IMPLANT
ELECT REM PT RETURN 9FT ADLT (ELECTROSURGICAL) ×3
ELECTRODE REM PT RTRN 9FT ADLT (ELECTROSURGICAL) ×1 IMPLANT
GAUZE 4X4 16PLY RFD (DISPOSABLE) IMPLANT
GAUZE SPONGE 4X4 12PLY STRL (GAUZE/BANDAGES/DRESSINGS) IMPLANT
GLOVE BIOGEL PI IND STRL 6.5 (GLOVE) ×2 IMPLANT
GLOVE BIOGEL PI IND STRL 8.5 (GLOVE) ×2 IMPLANT
GLOVE BIOGEL PI INDICATOR 6.5 (GLOVE) ×4
GLOVE BIOGEL PI INDICATOR 8.5 (GLOVE) ×4
GLOVE ECLIPSE 8.5 STRL (GLOVE) IMPLANT
GLOVE SURG SS PI 6.0 STRL IVOR (GLOVE) ×12 IMPLANT
GLOVE SURG SS PI 8.5 STRL IVOR (GLOVE) ×4
GLOVE SURG SS PI 8.5 STRL STRW (GLOVE) ×2 IMPLANT
GOWN STRL REUS W/ TWL LRG LVL3 (GOWN DISPOSABLE) ×2 IMPLANT
GOWN STRL REUS W/ TWL XL LVL3 (GOWN DISPOSABLE) IMPLANT
GOWN STRL REUS W/TWL 2XL LVL3 (GOWN DISPOSABLE) ×6 IMPLANT
GOWN STRL REUS W/TWL LRG LVL3 (GOWN DISPOSABLE) ×4
GOWN STRL REUS W/TWL XL LVL3 (GOWN DISPOSABLE)
HEMOSTAT POWDER KIT SURGIFOAM (HEMOSTASIS) ×3 IMPLANT
KIT BASIN OR (CUSTOM PROCEDURE TRAY) ×3 IMPLANT
KIT INFUSE SMALL (Orthopedic Implant) ×3 IMPLANT
KIT TURNOVER KIT B (KITS) ×3 IMPLANT
MILL MEDIUM DISP (BLADE) ×3 IMPLANT
NEEDLE HYPO 22GX1.5 SAFETY (NEEDLE) ×3 IMPLANT
NS IRRIG 1000ML POUR BTL (IV SOLUTION) ×3 IMPLANT
PACK LAMINECTOMY NEURO (CUSTOM PROCEDURE TRAY) ×3 IMPLANT
PAD ARMBOARD 7.5X6 YLW CONV (MISCELLANEOUS) ×9 IMPLANT
PATTIES SURGICAL .5 X1 (DISPOSABLE) ×3 IMPLANT
PLASMABLADE 3.0S (MISCELLANEOUS) ×3
ROD RELINE-O LORD 5.5X35MM (Rod) ×6 IMPLANT
SCREW LOCK RELINE 5.5 TULIP (Screw) ×12 IMPLANT
SCREW RELINE-O POLY 6.5X45 (Screw) ×12 IMPLANT
SPONGE LAP 4X18 RFD (DISPOSABLE) IMPLANT
SPONGE SURGIFOAM ABS GEL 100 (HEMOSTASIS) ×3 IMPLANT
SUT PROLENE 6 0 BV (SUTURE) IMPLANT
SUT VIC AB 1 CT1 18XBRD ANBCTR (SUTURE) ×1 IMPLANT
SUT VIC AB 1 CT1 8-18 (SUTURE) ×3
SUT VIC AB 2-0 CP2 18 (SUTURE) ×3 IMPLANT
SUT VIC AB 3-0 SH 8-18 (SUTURE) ×3 IMPLANT
SYR 3ML LL SCALE MARK (SYRINGE) ×12 IMPLANT
TOWEL GREEN STERILE (TOWEL DISPOSABLE) ×3 IMPLANT
TOWEL GREEN STERILE FF (TOWEL DISPOSABLE) ×3 IMPLANT
TRAY FOLEY MTR SLVR 16FR STAT (SET/KITS/TRAYS/PACK) IMPLANT
WATER STERILE IRR 1000ML POUR (IV SOLUTION) ×3 IMPLANT

## 2019-01-28 NOTE — ED Notes (Signed)
To MRI foley emptied of 800 cc

## 2019-01-28 NOTE — Transfer of Care (Signed)
Immediate Anesthesia Transfer of Care Note  Patient: Regina Ortiz  Procedure(s) Performed: POSTERIOR LUMBAR FUSION LUMBAR FOUR - LUMBAR FIVE WITH FUSION. (N/A Back)  Patient Location: PACU  Anesthesia Type:General  Level of Consciousness: awake and alert   Airway & Oxygen Therapy: Patient Spontanous Breathing and Patient connected to nasal cannula oxygen  Post-op Assessment: Report given to RN and Post -op Vital signs reviewed and stable  Post vital signs: Reviewed and stable  Last Vitals:  Vitals Value Taken Time  BP    Temp    Pulse    Resp    SpO2      Last Pain:  Vitals:   01/28/19 1049  TempSrc:   PainSc: 7          Complications: No apparent anesthesia complications

## 2019-01-28 NOTE — ED Notes (Signed)
ED Provider at bedside. 

## 2019-01-28 NOTE — ED Provider Notes (Signed)
[facility] EMERGENCY DEPARTMENT Provider Note   CSN: [id number] Arrival date & time: 01/28/19  7510    History   Chief Complaint Chief Complaint  Patient presents with  . Back Pain    HPI Regina Ortiz is a 73 y.o. female.     HPI   Regina Ortiz is a 73 y.o. female, with a history of COPD, HTN, presenting to the ED with lower back pain for the last week and a half.  Pain is worse on the left, aching, 9/10, radiating down the left leg with occasional radiation down the right leg.  Also notes increasing weakness in the left leg as well as difficulty urinating. Patient saw her neurosurgeon, Dr. Christella Noa, yesterday in the office.  He ordered a lumbar spine MRI as well as prescribed hydrocodone/APAP and Flexeril, however, patient states she was just in too much pain. Last vicodin 4 am Last flexeril 12:30 am Denies fever/chills, falls/trauma, abdominal pain, hematuria, dysuria, changes in bowel or bladder function, saddle anesthesias, chest pain, or any other complaints.     Past Medical History:  Diagnosis Date  . AIN grade I   . Anxiety   . Bruises easily   . Chronic diarrhea   . Chronic hyponatremia   . Colitis   . COPD (chronic obstructive pulmonary disease) ([place])   . Depression   . GERD (gastroesophageal reflux disease)   . History of chronic bronchitis   . History of vulvar dysplasia    serveral recurrency's   . Hypertension   . LBBB (left bundle branch block) W/ PROLONGED PR   CARDIOLOGSIT-  DR Johnsie Cancel-   LOV IN EPIC  . Macrocytosis   . Nocturia   . Osteoporosis   . Seronegative rheumatoid arthritis ([place])   . Short of breath on exertion   . Smokers' cough ([place])   . Venous insufficiency of leg    , Edema  . VIN III (vulvar intraepithelial neoplasia III)    RECURRENT -    Patient Active Problem List   Diagnosis Date Noted  . Chest pain 09/25/2017  . Hypertensive urgency 09/24/2017  . Headache 09/24/2017  . Alcohol use 09/24/2017  .  AKI (acute kidney injury) ([place]) 09/24/2017  . Bilateral carotid artery stenosis   . Vulvovaginal candidiasis 10/22/2015  . Mild malnutrition ([place]) 07/07/2013  . Chronic bronchitis ([place]) 07/07/2013  . Tobacco abuse 07/07/2013  . Hypertension   . Short of breath on exertion   . Hemorrhoid   . LBBB (left bundle branch block)   . Anxiety   . Depression   . RA (rheumatoid arthritis) ([place])   . Venous insufficiency of leg   . Chronic hyponatremia   . AIN grade I 04/05/2013  . Diarrhea 09/22/2012  . Hyponatremia 09/22/2012  . Hypokalemia 09/22/2012  . ARF (acute renal failure) ([place]) 09/22/2012  . Atypical chest pain 06/22/2012  . Anemia 06/22/2012  . Vulvar intraepithelial neoplasia III (VIN III) 01/29/2012    Past Surgical History:  Procedure Laterality Date  . APPENDECTOMY  1992  . CARDIOVASCULAR STRESS TEST  06-23-2012  DR Jenkins [place]   NORMAL LEXISCAN MYOVIEW/ EF 83%/  NO ISCHEMIA  . CO2 LASER APPLICATION  25/85/2778   Procedure: CO2 LASER APPLICATION;  Surgeon: Janie Morning, MD PHD;  Location: [facility];  Service: Gynecology;  Laterality: N/A;  Laser Vaporization  . CO2 LASER APPLICATION N/A 10/06/2351   Procedure: CO2 LASER APPLICATION;  Surgeon: [name], MD;  Location:   SURGERY CENTER;  Service: Gynecology;  Laterality: N/A;  . CO2 LASER APPLICATION N/A 2/99/3716   Procedure: CO2 LASER APPLICATION of the vulva;  Surgeon: Janie Morning, MD;  Location: [facility];  Service: Gynecology;  Laterality: N/A;  . EXCISION VULVAR LESIONS  07/2012  . HEMORRHOID SURGERY N/A 04/15/2013   Procedure: Excision of external and internal  hemorrhoid with AIN, Exam under anesthesia;  Surgeon: Odis Hollingshead, MD;  Location: [facility];  Service: General;  Laterality: N/A;  . REPAIR FISTULA IN ANO/  I & D PERIRECTAL ABSCESS  10-26-2002  DR Johnathan Hausen  . TRANSTHORACIC ECHOCARDIOGRAM  06-23-2012   NORMAL LVSF/ EF 55-60%  . VAGINAL HYSTERECTOMY  1997    partial  . VULVAR LESION REMOVAL N/A 11/03/2014   Procedure: CO2 LASER OF VULVAR ;  Surgeon: [name], MD;  Location: [facility];  Service: Gynecology;  Laterality: N/A;  . VULVECTOMY N/A 03/26/2015   Procedure: WIDE LOCA  EXCISION OF VULVAR;  Surgeon: [name], MD;  Location: [facility];  Service: Gynecology;  Laterality: N/A;  . Wide Local Excision of labia majora  04-04-2010   Right-sided lesion, CO2 ablation of right labia minora     OB History   No obstetric history on file.      Home Medications    Prior to Admission medications   Medication Sig Start Date End Date Taking? Authorizing Provider  alendronate (FOSAMAX) 70 MG tablet Take 70 mg by mouth once a week. 11/29/18  Yes [provider]  ALPRAZolam Duanne Moron) 0.5 MG tablet Take 0.5 mg by mouth 2 (two) times daily as needed.  11/08/17  Yes [provider]  atorvastatin (LIPITOR) 40 MG tablet Take 1 tablet by mouth once daily 12/22/18  Yes Skains, Thana Farr, MD  Biotin 5000 MCG CAPS Take 5,000 mcg by mouth daily.   Yes [provider]  busPIRone (BUSPAR) 30 MG tablet Take 30 mg by mouth 2 (two) times daily.  11/20/14  Yes [provider]  Calcium Carbonate-Vitamin D (CALTRATE 600+D PO) Take 1 tablet by mouth daily.    Yes [provider]  Cholecalciferol (VITAMIN D3) 1000 UNITS CAPS Take 1,000 Units by mouth daily.    Yes [provider]  cyclobenzaprine (FLEXERIL) 10 MG tablet Take 10 mg by mouth 3 (three) times daily as needed for muscle spasms. 01/27/19  Yes [provider]  diltiazem (TIAZAC) 360 MG 24 hr capsule Take 360 mg by mouth daily.    Yes [provider]  hydrALAZINE (APRESOLINE) 50 MG tablet Take 50 mg by mouth 3 (three) times daily. 07/16/18  Yes [provider]  HYDROcodone-acetaminophen (NORCO/VICODIN) 5-325 MG tablet Take 1 tablet by mouth every 6 (six) hours as needed for pain. for pain 01/27/19  Yes  [provider]  Hydroxychloroquine Sulfate (PLAQUENIL PO) Take 200 mg by mouth daily.    Yes [provider]  losartan (COZAAR) 100 MG tablet Take 100 mg by mouth daily.  10/23/17  Yes [provider]  naproxen sodium (ALEVE) 220 MG tablet Take 220 mg by mouth daily as needed (pain).   Yes [provider]  polyethylene glycol (MIRALAX / GLYCOLAX) 17 g packet Take 17 g by mouth daily as needed for mild constipation.   Yes [provider]  Potassium (POTASSIMIN PO) Take 1 tablet by mouth every evening.    Yes [provider]  predniSONE (DELTASONE) 20 MG tablet Take 2 tablets (40 mg total)  by mouth daily. 01/25/19  Yes Virgel Manifold, MD  predniSONE (DELTASONE) 5 MG tablet Take 5 mg by mouth daily with breakfast. --  LONG-TERM USE FOR RA   Yes [provider]  Prenatal Vit-Fe Fumarate-FA (PRENATAL MULTIVITAMIN) TABS tablet Take 1 tablet by mouth daily at 12 noon.   Yes [provider]  saccharomyces boulardii (FLORASTOR) 250 MG capsule Take 250 mg by mouth daily.   Yes [provider]  Specialty Vitamins Products (MAGNESIUM, AMINO ACID CHELATE,) 133 MG tablet Take 1 tablet by mouth daily.   Yes [provider]  spironolactone (ALDACTONE) 25 MG tablet Take 0.5 tablets (12.5 mg total) by mouth daily. 01/26/19  Yes Jerline Pain, MD  traZODone (DESYREL) 50 MG tablet Take 100 mg by mouth at bedtime.  11/16/17  Yes [provider]  Turmeric 500 MG CAPS Take 500 mg by mouth daily.   Yes [provider]  amoxicillin-clavulanate (AUGMENTIN) 875-125 MG tablet Take 1 tablet by mouth every 12 (twelve) hours. Patient not taking: Reported on 01/25/2019 09/12/18   Maudie Flakes, MD  metoprolol tartrate (LOPRESSOR) 100 MG tablet Take 1 tablet (100 mg total) by mouth once for 1 dose. Take 1 tablet 2 hours before your CT scan 01/12/19 01/12/19  Jerline Pain, MD  oxyCODONE (ROXICODONE) 5 MG immediate release tablet  Take 1 tablet (5 mg total) by mouth every 4 (four) hours as needed for severe pain. Patient not taking: Reported on 01/25/2019 09/12/18   Maudie Flakes, MD  traMADol (ULTRAM) 50 MG tablet Take 1 tablet (50 mg total) by mouth every 6 (six) hours as needed. Patient not taking: Reported on 01/28/2019 01/25/19   Virgel Manifold, MD    Family History Family History  Problem Relation Age of Onset  . Diabetes Mother   . Hypertension Father   . Lymphoma Son   . Cancer Son        lymphoblastic lymphoma    Social History Social History   Tobacco Use  . Smoking status: Current Every Day Smoker    Packs/day: 0.25    Years: 50.00    Pack years: 12.50    Types: Cigarettes  . Smokeless tobacco: Never Used  Substance Use Topics  . Alcohol use: Yes    Alcohol/week: 2.0 standard drinks    Types: 2 Cans of beer per week  . Drug use: No     Allergies   Latex; Augmentin [amoxicillin-pot clavulanate]; Doxycycline; Lactose intolerance (gi); Lialda [mesalamine]; Prinivil [lisinopril]; Triamcinolone acetonide; Amlodipine; and Lotensin [benazepril hcl]   Review of Systems Review of Systems  Constitutional: Negative for chills, diaphoresis and fever.  Respiratory: Negative for cough and shortness of breath.   Cardiovascular: Negative for chest pain.  Gastrointestinal: Negative for abdominal pain, diarrhea, nausea and vomiting.  Genitourinary: Negative for difficulty urinating, dysuria, frequency and hematuria.  Musculoskeletal: Positive for back pain.  Neurological: Positive for weakness.  All other systems reviewed and are negative.    Physical Exam Updated Vital Signs BP (!) 204/88 (BP Location: Right Arm)   Pulse 89   Temp 98.5 F (36.9 C) (Oral)   LMP  (LMP Unknown)   SpO2 99%   Physical Exam Vitals signs and nursing note reviewed.  Constitutional:      General: She is not in acute distress.    Appearance: She is well-developed. She is not diaphoretic.  HENT:     Head:  Normocephalic and atraumatic.     Mouth/Throat:  Mouth: Mucous membranes are moist.     Pharynx: Oropharynx is clear.  Eyes:     Conjunctiva/sclera: Conjunctivae normal.  Neck:     Musculoskeletal: Neck supple.  Cardiovascular:     Rate and Rhythm: Normal rate and regular rhythm.     Pulses: Normal pulses.          Radial pulses are 2+ on the right side and 2+ on the left side.       Posterior tibial pulses are 2+ on the right side and 2+ on the left side.     Heart sounds: Normal heart sounds.     Comments: Tactile temperature in the extremities appropriate and equal bilaterally. Pulmonary:     Effort: Pulmonary effort is normal. No respiratory distress.     Breath sounds: Normal breath sounds.  Abdominal:     Palpations: Abdomen is soft.     Tenderness: There is no abdominal tenderness. There is no guarding.  Genitourinary:    Comments: She seems to have adequately intact perianal sensation.  However, rectal tone seems to be decreased.  No stool burden in the rectal vault.  Musculoskeletal:     Lumbar back: She exhibits tenderness and bony tenderness.       Back:     Right lower leg: No edema.     Left lower leg: No edema.  Lymphadenopathy:     Cervical: No cervical adenopathy.  Skin:    General: Skin is warm and dry.  Neurological:     Mental Status: She is alert.     Deep Tendon Reflexes:     Reflex Scores:      Patellar reflexes are 3+ on the right side and 2+ on the left side.      Achilles reflexes are 2+ on the right side and 2+ on the left side.    Comments: No noted saddle anesthesias. Area of paresthesia to the left anterior lateral shin, otherwise sensation to light touch grossly intact. Strength 3/5 hips bilat, maybe due to pain 3/5 in left quad 4/5 right quad 4/5 bilat ankle  Psychiatric:        Mood and Affect: Mood and affect normal.        Speech: Speech normal.        Behavior: Behavior normal.      ED Treatments / Results  Labs (all labs  ordered are listed, but only abnormal results are displayed) Labs Reviewed  BASIC METABOLIC PANEL - Abnormal; Notable for the following components:      Result Value   Sodium 129 (*)    Chloride 95 (*)    Creatinine, Ser 1.08 (*)    GFR calc non Af Amer 51 (*)    GFR calc Af Amer 59 (*)    All other components within normal limits  CBC WITH DIFFERENTIAL/PLATELET - Abnormal; Notable for the following components:   RBC 3.45 (*)    HCT 34.6 (*)    MCV 100.3 (*)    MCH 35.7 (*)    All other components within normal limits  URINALYSIS, ROUTINE W REFLEX MICROSCOPIC - Abnormal; Notable for the following components:   Color, Urine STRAW (*)    Protein, ur 100 (*)    All other components within normal limits  SARS CORONAVIRUS 2 (HOSPITAL ORDER, [place])   BUN  Date Value Ref Range Status  01/28/2019 17 8 - 23 mg/dL Final  09/12/2018 11 8 - 23 mg/dL Final  02/03/2018 16 6 - 20 mg/dL Final  02/03/2018 14 6 - 20 mg/dL Final   Creatinine, Ser  Date Value Ref Range Status  01/28/2019 1.08 (H) 0.44 - 1.00 mg/dL Final  09/12/2018 1.11 (H) 0.44 - 1.00 mg/dL Final  02/03/2018 1.10 (H) 0.44 - 1.00 mg/dL Final  02/03/2018 1.13 (H) 0.44 - 1.00 mg/dL Final    EKG None  Radiology Mr Lumbar Spine Wo Contrast  Result Date: 01/28/2019 CLINICAL DATA:  Chronic low back pain.  Generalized weakness. EXAM: MRI LUMBAR SPINE WITHOUT CONTRAST TECHNIQUE: Multiplanar, multisequence MR imaging of the lumbar spine was performed. No intravenous contrast was administered. COMPARISON:  06/23/2018 FINDINGS: Segmentation: As on the prior MRI, the lowest fully formed intervertebral disc space is designated L5-S1. Alignment:  Lumbar dextroscoliosis with apex at L2-3.  No listhesis. Vertebrae: No fracture or suspicious osseous lesion. Degenerative endplate changes at B1-Y7 including low level edema. Conus medullaris and cauda equina: Conus extends to the L1 level. Conus and cauda equina  appear normal. Paraspinal and other soft tissues: Bilateral renal cysts. Disc levels: Disc desiccation throughout the lumbar spine with unchanged moderate disc space narrowing at L3-4 and severe narrowing at L4-5 and L5-S1. L1-2 negative. L2-3: Minimal disc bulging and mild facet hypertrophy without stenosis. L3-4: Circumferential disc bulging greater to the left and mild right and moderate left facet hypertrophy result in moderate left lateral recess stenosis and mild-to-moderate left neural foraminal stenosis, unchanged. Potential left L4 nerve root impingement. Mild spinal stenosis with prominent dorsal epidural fat contributing. L4-5: Circumferential disc bulging, a left paracentral to left foraminal disc extrusion with superior migration, and moderate facet hypertrophy result in severe spinal stenosis, severe left greater than right lateral recess stenosis, and severe left neural foraminal stenosis with potential bilateral L5 and left L4 nerve root impingement. The disc extrusion has enlarged with new left foraminal extension and worsening of left foraminal stenosis. L5-S1: Disc bulging, disc space height loss, and moderate facet hypertrophy result in moderate bilateral lateral recess stenosis and severe right and mild left neural foraminal stenosis, unchanged. Potential right L5 and bilateral S1 nerve root impingement. IMPRESSION: 1. Interval enlargement of an L4-5 disc extrusion with increased severe left neural foraminal stenosis. Chronic severe spinal and bilateral lateral recess stenosis. 2. Unchanged moderate lateral recess and severe right neural foraminal stenosis at L5-S1. 3. Unchanged moderate left lateral recess stenosis at L3-4. Electronically Signed   By: Logan Bores M.D.   On: 01/28/2019 12:55    Procedures Procedures (including critical care time)  Medications Ordered in ED Medications  morphine 4 MG/ML injection 4 mg (4 mg Intravenous Given 01/28/19 1008)  ondansetron (ZOFRAN) injection  4 mg (4 mg Intravenous Given 01/28/19 1008)  HYDROmorphone (DILAUDID) injection 1 mg (1 mg Intravenous Given 01/28/19 1208)  sodium chloride 0.9 % bolus 1,000 mL (1,000 mLs Intravenous New Bag/Given 01/28/19 1454)     Initial Impression / Assessment and Plan / ED Course  I have reviewed the triage vital signs and the nursing notes.  Pertinent labs & imaging results that were available during my care of the patient were reviewed by me and considered in my medical decision making (see chart for details).  Clinical Course as of Jan 28 1608  Fri Jan 28, 2019  1017 Spoke with MRI, Lars Mage, to notify them of possible cauda equina.   [SJ]  1310 Spoke with patient about MRI results.  She states her pain is still well controlled.   [SJ]  [place] with Janett Billow  with [facility]. States she will try to get ahold of Dr. Ellene Route, currently in the [place].   [SJ]  1439 Spoke with Dr. Ellene Route, neurosurgeon. States he will come assess the patient. In review of her records, Dr. Ellene Route states patient had been offered surgery in the past, but declined.   [SJ]  59 Spoke with Dr. Ellene Route following his evaluation of the patient. States he will admit the patient and plans to take her to surgery tonight. Requests preop EKG. Does not need chest xray.   [SJ]    Clinical Course User Index [SJ] Joy, Shawn C, PA-C       Patient presents with lower back pain and lower extremity weakness.  There were some inconsistencies in patient's exam, however, she does have elements of the exam concerning for cauda equina.  She does have lower extremity weakness, post void residual of 450 cc urine, and decreased rectal tone. Patient has worsening L4-5 disc protrusion on MRI.  She will be admitted by neurosurgery for surgical intervention today.   Findings and plan of care discussed with Noemi Chapel, MD. Dr. Sabra Heck personally evaluated and examined this patient.   Vitals:   01/28/19 1200 01/28/19 1300 01/28/19 1500  01/28/19 1600  BP: (!) 144/62 135/62 (!) 152/64 (!) 168/66  Pulse: 77 79 76 78  Resp: 14 12 10 16   Temp:      TempSrc:      SpO2: 92% 95% 97% 97%     Final Clinical Impressions(s) / ED Diagnoses   Final diagnoses:  Acute bilateral low back pain with left-sided sciatica  Weakness of left lower extremity    ED Discharge Orders    None       Layla Maw 01/28/19 1609    Noemi Chapel, MD 01/29/19 1041

## 2019-01-28 NOTE — Anesthesia Preprocedure Evaluation (Addendum)
Anesthesia Evaluation  Patient identified by MRN, date of birth, ID band Patient awake    Reviewed: Allergy & Precautions, NPO status , Patient's Chart, lab work & pertinent test results  History of Anesthesia Complications Negative for: history of anesthetic complications  Airway Mallampati: II  TM Distance: >3 FB Neck ROM: Full    Dental  (+) Dental Advisory Given   Pulmonary COPD, Current Smoker,    Pulmonary exam normal breath sounds clear to auscultation       Cardiovascular hypertension, Pt. on medications + Peripheral Vascular Disease  Normal cardiovascular exam+ dysrhythmias  Rhythm:Regular Rate:Normal   LBBB  '20 Carotid US - b/l 1-39% ICAS  '19 TTE - EF 55% to 60%. Grade 1 diastolic dysfunction. Ventricular septal motion showed abnormal function and dyssynergy.    Neuro/Psych  Headaches, PSYCHIATRIC DISORDERS Anxiety Depression  Cauda equina syndrome     GI/Hepatic Neg liver ROS, GERD  , Chronic diarrhea    Endo/Other  negative endocrine ROS Chronic hyponatremia    Renal/GU Renal InsufficiencyRenal disease     Musculoskeletal  (+) Arthritis , Rheumatoid disorders,  narcotic dependent  Abdominal   Peds  Hematology negative hematology ROS (+) anemia ,   Anesthesia Other Findings   Reproductive/Obstetrics negative OB ROS                            Anesthesia Physical Anesthesia Plan  ASA: III and emergent  Anesthesia Plan: General   Post-op Pain Management:    Induction: Intravenous  PONV Risk Score and Plan: 4 or greater and Treatment may vary due to age or medical condition, Ondansetron and Dexamethasone  Airway Management Planned: Oral ETT  Additional Equipment: None  Intra-op Plan:   Post-operative Plan: Extubation in OR  Informed Consent:   Plan Discussed with: CRNA and Anesthesiologist  Anesthesia Plan Comments:         Anesthesia Quick  Evaluation

## 2019-01-28 NOTE — H&P (Signed)
Regina Ortiz is an 73 y.o. female.   Chief Complaint: Back and lower extremity pain with leg weakness and urinary retention HPI: Patient is a 73 year old individual who has had a longstanding history of a herniated nucleus pulposus at L4-L5.  She had been evaluated in October 2019 and an MRI then demonstrated presence of herniated nucleus pulposus.  She notes that she was getting along reasonably well using only some Aleve for pain control however she notes in the past 3 weeks she developed severely worsening back and leg pain with weakness in the left lower extremity.  She was seen in the emergency department on Tuesday and was given some prednisone.  She notes that this did not seem to alleviate the pain much and she feels her legs have gotten progressively weaker.  She was seen in the office by Dr. Christella Noa yesterday and he advised an MRI.  She notes that this morning when she got up she felt so miserable with pain and weak that she did not feel she could lie comfortably for the MRI and she presented to the emergency department.  Here it was noted that she had severely weak lower extremities but also it was noted that she had significant urinary retention even after an attempt at voiding there is noted that she had over 800 cc of urine in her bladder on a Foley catheter was placed.  She has noted decreased sensation in the perineal region.  An MRI was performed which demonstrates that there is been progression of a large disc herniation now with extraforaminal components to the disc protrusion particularly on the left side.  There is advanced degeneration of the disc space and I advised that the patient likely need surgery sooner than later because she is developing a cauda equina syndrome.  She is now being admitted  Past Medical History:  Diagnosis Date  . AIN grade I   . Anxiety   . Bruises easily   . Chronic diarrhea   . Chronic hyponatremia   . Colitis   . COPD (chronic obstructive pulmonary  disease) ([place])   . Depression   . GERD (gastroesophageal reflux disease)   . History of chronic bronchitis   . History of vulvar dysplasia    serveral recurrency's   . Hypertension   . LBBB (left bundle branch block) W/ PROLONGED PR   CARDIOLOGSIT-  DR Johnsie Cancel-   LOV IN EPIC  . Macrocytosis   . Nocturia   . Osteoporosis   . Seronegative rheumatoid arthritis ([place])   . Short of breath on exertion   . Smokers' cough ([place])   . Venous insufficiency of leg    , Edema  . VIN III (vulvar intraepithelial neoplasia III)    RECURRENT -    Past Surgical History:  Procedure Laterality Date  . APPENDECTOMY  1992  . CARDIOVASCULAR STRESS TEST  06-23-2012  DR Jenkins Rouge   NORMAL LEXISCAN MYOVIEW/ EF 83%/  NO ISCHEMIA  . CO2 LASER APPLICATION  12/87/8676   Procedure: CO2 LASER APPLICATION;  Surgeon: Janie Morning, MD PHD;  Location: [facility];  Service: Gynecology;  Laterality: N/A;  Laser Vaporization  . CO2 LASER APPLICATION N/A 03/02/946   Procedure: CO2 LASER APPLICATION;  Surgeon: [name], MD;  Location: [facility];  Service: Gynecology;  Laterality: N/A;  . CO2 LASER APPLICATION N/A 0/96/2836   Procedure: CO2 LASER APPLICATION of the vulva;  Surgeon: Janie Morning, MD;  Location: [facility];  Service: Gynecology;  Laterality: N/A;  . EXCISION VULVAR LESIONS  07/2012  . HEMORRHOID SURGERY N/A 04/15/2013   Procedure: Excision of external and internal  hemorrhoid with AIN, Exam under anesthesia;  Surgeon: Odis Hollingshead, MD;  Location: [facility];  Service: General;  Laterality: N/A;  . REPAIR FISTULA IN ANO/  I & D PERIRECTAL ABSCESS  10-26-2002  DR Johnathan Hausen  . TRANSTHORACIC ECHOCARDIOGRAM  06-23-2012   NORMAL LVSF/ EF 55-60%  . VAGINAL HYSTERECTOMY  1997   partial  . VULVAR LESION REMOVAL N/A 11/03/2014   Procedure: CO2 LASER OF VULVAR ;  Surgeon: [name], MD;  Location: [facility];  Service: Gynecology;   Laterality: N/A;  . VULVECTOMY N/A 03/26/2015   Procedure: WIDE LOCA  EXCISION OF VULVAR;  Surgeon: [name], MD;  Location: [place];  Service: Gynecology;  Laterality: N/A;  . Wide Local Excision of labia majora  04-04-2010   Right-sided lesion, CO2 ablation of right labia minora    Family History  Problem Relation Age of Onset  . Diabetes Mother   . Hypertension Father   . Lymphoma Son   . Cancer Son        lymphoblastic lymphoma   Social History:  reports that she has been smoking cigarettes. She has a 12.50 pack-year smoking history. She has never used smokeless tobacco. She reports current alcohol use of about 2.0 standard drinks of alcohol per week. She reports that she does not use drugs.  Allergies:  Allergies  Allergen Reactions  . Latex Swelling and Other (See Comments)     fever blisters  . Augmentin [Amoxicillin-Pot Clavulanate] Nausea And Vomiting  . Doxycycline Other (See Comments)    Blurred vision, nervous, unsteady  . Lactose Intolerance (Gi) Other (See Comments)    Unknown, per pt  . Lialda [Mesalamine] Diarrhea  . Prinivil [Lisinopril] Diarrhea  . Triamcinolone Acetonide Other (See Comments)    REDNESS AND PAIN  . Amlodipine Rash  . Lotensin [Benazepril Hcl] Rash    (Not in a hospital admission)   Results for orders placed or performed during the hospital encounter of 01/28/19 (from the past 48 hour(s))  Basic metabolic panel     Status: Abnormal   Collection Time: 01/28/19 10:01 AM  Result Value Ref Range   Sodium 129 (L) 135 - 145 mmol/L   Potassium 3.6 3.5 - 5.1 mmol/L   Chloride 95 (L) 98 - 111 mmol/L   CO2 22 22 - 32 mmol/L   Glucose, Bld 98 70 - 99 mg/dL   BUN 17 8 - 23 mg/dL   Creatinine, Ser 1.08 (H) 0.44 - 1.00 mg/dL   Calcium 9.5 8.9 - 10.3 mg/dL   GFR calc non Af Amer 51 (L) >60 mL/min   GFR calc Af Amer 59 (L) >60 mL/min   Anion gap 12 5 - 15    Comment: Performed at [facility], [address]  [place], [place] [address]  CBC with Differential     Status: Abnormal   Collection Time: 01/28/19 10:01 AM  Result Value Ref Range   WBC 9.6 4.0 - 10.5 K/uL   RBC 3.45 (L) 3.87 - 5.11 MIL/uL   Hemoglobin 12.3 12.0 - 15.0 g/dL   HCT 34.6 (L) 36.0 - 46.0 %   MCV 100.3 (H) 80.0 - 100.0 fL   MCH 35.7 (H) 26.0 - 34.0 pg   MCHC 35.5 30.0 - 36.0 g/dL   RDW 13.2 11.5 -  15.5 %   Platelets 190 150 - 400 K/uL   nRBC 0.0 0.0 - 0.2 %   Neutrophils Relative % 78 %   Neutro Abs 7.5 1.7 - 7.7 K/uL   Lymphocytes Relative 12 %   Lymphs Abs 1.1 0.7 - 4.0 K/uL   Monocytes Relative 8 %   Monocytes Absolute 0.8 0.1 - 1.0 K/uL   Eosinophils Relative 2 %   Eosinophils Absolute 0.2 0.0 - 0.5 K/uL   Basophils Relative 0 %   Basophils Absolute 0.0 0.0 - 0.1 K/uL   Immature Granulocytes 0 %   Abs Immature Granulocytes 0.02 0.00 - 0.07 K/uL    Comment: Performed at [facility] [address], [place], [place] [address]  Urinalysis, Routine w reflex microscopic     Status: Abnormal   Collection Time: 01/28/19 10:15 AM  Result Value Ref Range   Color, Urine STRAW (A) YELLOW   APPearance CLEAR CLEAR   Specific Gravity, Urine 1.006 1.005 - 1.030   pH 7.0 5.0 - 8.0   Glucose, UA NEGATIVE NEGATIVE mg/dL   Hgb urine dipstick NEGATIVE NEGATIVE   Bilirubin Urine NEGATIVE NEGATIVE   Ketones, ur NEGATIVE NEGATIVE mg/dL   Protein, ur 100 (A) NEGATIVE mg/dL   Nitrite NEGATIVE NEGATIVE   Leukocytes,Ua NEGATIVE NEGATIVE   RBC / HPF 0-5 0 - 5 RBC/hpf   WBC, UA 0-5 0 - 5 WBC/hpf   Bacteria, UA NONE SEEN NONE SEEN    Comment: Performed at [facility] [address], [place], [place] [address]  SARS Coronavirus 2 (CEPHEID - Performed in [facility] hospital lab), Hosp Order     Status: None   Collection Time: 01/28/19 11:04 AM  Result Value Ref Range   SARS Coronavirus 2 NEGATIVE NEGATIVE    Comment: (NOTE) If result is NEGATIVE SARS-CoV-2 target nucleic acids are NOT DETECTED. The SARS-CoV-2 RNA is  generally detectable in upper and lower  respiratory specimens during the acute phase of infection. The lowest  concentration of SARS-CoV-2 viral copies this assay can detect is 250  copies / mL. A negative result does not preclude SARS-CoV-2 infection  and should not be used as the sole basis for treatment or other  patient management decisions.  A negative result may occur with  improper specimen collection / handling, submission of specimen other  than nasopharyngeal swab, presence of viral mutation(s) within the  areas targeted by this assay, and inadequate number of viral copies  (<250 copies / mL). A negative result must be combined with clinical  observations, patient history, and epidemiological information. If result is POSITIVE SARS-CoV-2 target nucleic acids are DETECTED. The SARS-CoV-2 RNA is generally detectable in upper and lower  respiratory specimens dur ing the acute phase of infection.  Positive  results are indicative of active infection with SARS-CoV-2.  Clinical  correlation with patient history and other diagnostic information is  necessary to determine patient infection status.  Positive results do  not rule out bacterial infection or co-infection with other viruses. If result is PRESUMPTIVE POSTIVE SARS-CoV-2 nucleic acids MAY BE PRESENT.   A presumptive positive result was obtained on the submitted specimen  and confirmed on repeat testing.  While 2019 novel coronavirus  (SARS-CoV-2) nucleic acids may be present in the submitted sample  additional confirmatory testing may be necessary for epidemiological  and / or clinical management purposes  to differentiate between  SARS-CoV-2 and other Sarbecovirus currently known to infect humans.  If clinically indicated  additional testing with an alternate test  methodology [phone]) is advised. The SARS-CoV-2 RNA is generally  detectable in upper and lower respiratory sp ecimens during the acute  phase of  infection. The expected result is Negative. Fact Sheet for Patients:  StrictlyIdeas.no Fact Sheet for Healthcare Providers: BankingDealers.co.za This test is not yet approved or cleared by the [place] FDA and has been authorized for detection and/or diagnosis of SARS-CoV-2 by FDA under an Emergency Use Authorization (EUA).  This EUA will remain in effect (meaning this test can be used) for the duration of the COVID-19 declaration under Section 564(b)(1) of the Act, 21 U.S.C. section 360bbb-3(b)(1), unless the authorization is terminated or revoked sooner. Performed at [facility], [place] [address] [place], [place] [address]    Mr Lumbar Spine Wo Contrast  Result Date: 01/28/2019 CLINICAL DATA:  Chronic low back pain.  Generalized weakness. EXAM: MRI LUMBAR SPINE WITHOUT CONTRAST TECHNIQUE: Multiplanar, multisequence MR imaging of the lumbar spine was performed. No intravenous contrast was administered. COMPARISON:  06/23/2018 FINDINGS: Segmentation: As on the prior MRI, the lowest fully formed intervertebral disc space is designated L5-S1. Alignment:  Lumbar dextroscoliosis with apex at L2-3.  No listhesis. Vertebrae: No fracture or suspicious osseous lesion. Degenerative endplate changes at P5-T6 including low level edema. Conus medullaris and cauda equina: Conus extends to the L1 level. Conus and cauda equina appear normal. Paraspinal and other soft tissues: Bilateral renal cysts. Disc levels: Disc desiccation throughout the lumbar spine with unchanged moderate disc space narrowing at L3-4 and severe narrowing at L4-5 and L5-S1. L1-2 negative. L2-3: Minimal disc bulging and mild facet hypertrophy without stenosis. L3-4: Circumferential disc bulging greater to the left and mild right and moderate left facet hypertrophy result in moderate left lateral recess stenosis and mild-to-moderate left neural foraminal stenosis, unchanged. Potential  left L4 nerve root impingement. Mild spinal stenosis with prominent dorsal epidural fat contributing. L4-5: Circumferential disc bulging, a left paracentral to left foraminal disc extrusion with superior migration, and moderate facet hypertrophy result in severe spinal stenosis, severe left greater than right lateral recess stenosis, and severe left neural foraminal stenosis with potential bilateral L5 and left L4 nerve root impingement. The disc extrusion has enlarged with new left foraminal extension and worsening of left foraminal stenosis. L5-S1: Disc bulging, disc space height loss, and moderate facet hypertrophy result in moderate bilateral lateral recess stenosis and severe right and mild left neural foraminal stenosis, unchanged. Potential right L5 and bilateral S1 nerve root impingement. IMPRESSION: 1. Interval enlargement of an L4-5 disc extrusion with increased severe left neural foraminal stenosis. Chronic severe spinal and bilateral lateral recess stenosis. 2. Unchanged moderate lateral recess and severe right neural foraminal stenosis at L5-S1. 3. Unchanged moderate left lateral recess stenosis at L3-4. Electronically Signed   By: Logan Bores M.D.   On: 01/28/2019 12:55    Review of Systems  Constitutional: Negative.   HENT: Negative.   Eyes: Negative.   Respiratory: Negative.   Cardiovascular: Negative.   Gastrointestinal: Negative.   Genitourinary:       Urinary retention  Musculoskeletal: Positive for back pain.  Skin: Negative.   Neurological: Positive for tingling, sensory change, focal weakness and weakness.  Endo/Heme/Allergies: Negative.   Psychiatric/Behavioral:       Anxiety    Blood pressure (!) 152/64, pulse 76, temperature 98.5 F (36.9 C), temperature source Oral, resp. rate 10, SpO2 97 %. Physical Exam  Constitutional: She is oriented to person, place, and time. She  appears well-developed and well-nourished.  HENT:  Head: Normocephalic and atraumatic.  Eyes:  Pupils are equal, round, and reactive to light. Conjunctivae and EOM are normal.  Neck: Normal range of motion. Neck supple.  Cardiovascular: Normal rate and regular rhythm.  Respiratory: Effort normal and breath sounds normal.  GI: Soft. Bowel sounds are normal.  Musculoskeletal:     Comments: Straight leg raising exam is positive at 30 degrees in either lower extremity Patrick's maneuver is negative bilaterally.  Neurological: She is alert and oriented to person, place, and time.  Crease lower extremity strength with 3 out of 5 strength in the iliopsoas and quad on the left 4 out of 5 on the right 4-5 strength in the tibialis anterior and gastrocs bilaterally.  Absent patellar and Achilles reflexes.  Upper extremity strength and reflexes are within limits of normal.  Nerve examination is normal.     Assessment/Plan Herniated nucleus pulposus L4-L5 with cauda equina syndrome.  Plan: The patient will undergo surgical decompression at L4-L5 with posterior lumbar interbody arthrodesis and pedicle screw fixation.  I have discussed the situation with the patient and her son by phone she is quite eager to have something definitive done as she notes that the pain is intolerable at this time.  Earleen Newport, MD 01/28/2019, 3:49 PM

## 2019-01-28 NOTE — Anesthesia Procedure Notes (Signed)
Procedure Name: Intubation Date/Time: 01/28/2019 5:55 PM Performed by: Cleda Daub, CRNA Pre-anesthesia Checklist: Patient identified, Emergency Drugs available, Suction available and Patient being monitored Patient Re-evaluated:Patient Re-evaluated prior to induction Oxygen Delivery Method: Circle system utilized Preoxygenation: Pre-oxygenation with 100% oxygen Induction Type: IV induction Ventilation: Mask ventilation without difficulty and Mask ventilation throughout procedure Laryngoscope Size: Mac and 3 Grade View: Grade I Tube type: Oral Tube size: 6.5 mm Number of attempts: 1 Airway Equipment and Method: Stylet Placement Confirmation: ETT inserted through vocal cords under direct vision,  positive ETCO2 and breath sounds checked- equal and bilateral Secured at: 20 cm Tube secured with: Tape Dental Injury: Teeth and Oropharynx as per pre-operative assessment

## 2019-01-28 NOTE — ED Notes (Signed)
Pt denies any numbness in groin or loss of bowel or bladder.

## 2019-01-28 NOTE — Op Note (Signed)
Date of surgery: 01/28/2019 Preoperative diagnosis: Spondylosis and stenosis with herniated nucleus pulposus L4-L5, cauda equina syndrome Postoperative diagnosis: Same Procedure: L4-L5 laminectomy and decompression of neural elements including the L4 the L5 nerve roots and the common dural tube with more work than required for simple interbody technique.  Posterior lumbar interbody arthrodesis with peek spacers local autograft and allograft and infuse posterior lateral arthrodesis with local autograft allograft and infuse occult screw fixation L4-L5. Surgeon: Kristeen Miss Anesthesia: General endotracheal Indications: Regina Ortiz is a 73 year old individual whose had significant weakness in her legs with the recent onset of bladder retention and inability to void.  She was found to have a worsening disc herniation at the level of L4-L5 and was so weak today that she came to the emergency department because she could not tolerate her outpatient plan MRI.  MRI demonstrated the worsening disc herniation.  Procedure: The patient was brought to the operating room supine on the stretcher.  After the smooth induction of general endotracheal anesthesia, she was turned prone.  The back was prepped with alcohol DuraPrep and draped in a sterile fashion.  Midline incision was then created and carried down to the lumbodorsal fascia which was opened on either side of midline.  The spinous process of L5 was identified positively and then the dissection was carried cephalad to expose the L4-L5 interspace.  Dissection was carried out to expose the inferior margin of L4 out to and including the facet joints at L3-L4 and transverse process of L4.  Dissection was then carried inferiorly to expose the facet joint at L4-5 and the transverse process of L5.  Then a large laminectomy was created removing the inferior margin lamina of L4 out to and including the entirety of the facets at L4-5.  This was done bilaterally the thickened  redundant yellow ligament was taken up carefully and the common dural tube was exposed.  Then by carefully palpating medially there was a substantial dorsal mass that was deforming the spinal canal.  This was carefully identified as a disc and ultimately after large laminotomies were created the path of the L 4 nerve root was identified superiorly and the L 5 nerve root was identified inferiorly.  These were carefully protected and the disc space was entered.  A substantial quantity of severely degenerated desiccated disc material was found in the epidural space below the ligament and attached to the ligament.  This dissection was taking on carefully in a piecemeal fashion to carefully decompress the dura from both sides.  Once an adequate decompression was obtained the disc space was evacuated of all the degenerated disc material that could be had from a series of dilators and spreaders were used to expand the interspace and ultimately was felt that a 9 mm tall 8 degree lordotic spacer would fit best into this place the spacer measured 23 mm in length.  It was filled with a combination of autograft allograft and infuse.  12 cc of the grafting combination was then fitted into the interspace in addition to the 2 spacers.  Once this was completed pedicle entry sites were chosen at L4-L5 and 6.5 x 45 mm pedicle screws were chosen and placed under fluoroscopic guidance.  2 short 35 mm rods were then used to connect the construct together in a neutral fashion.  Final radiographs confirmed good alignment of the vertebrae good placement of the grafts.  Care was taken to make sure that the L4 and L5 nerve roots were well decompressed  along with the common dural tube the remainder of some grafting mixture was placed into the lateral gutters beyond what was already present and once hemostasis was verified the lumbodorsal fascia was closed with #1 Vicryl in interrupted fashion and 2-0 Vicryl was used in the subcutaneous  tissues and 3-0 Vicryl subcuticularly.  Blood loss for the procedure was estimated 200 cc.

## 2019-01-28 NOTE — ED Triage Notes (Signed)
Pt arrives via [facility] for chronic back pain- pt has known herniated disk scheduled for an MRI today at 1pm but "pain to bad to wait". Pt has been taking oral pain medication.

## 2019-01-29 LAB — BASIC METABOLIC PANEL
Anion gap: 8 (ref 5–15)
BUN: 17 mg/dL (ref 8–23)
CO2: 21 mmol/L — ABNORMAL LOW (ref 22–32)
Calcium: 7.9 mg/dL — ABNORMAL LOW (ref 8.9–10.3)
Chloride: 98 mmol/L (ref 98–111)
Creatinine, Ser: 1.06 mg/dL — ABNORMAL HIGH (ref 0.44–1.00)
GFR calc Af Amer: >60 mL/min (ref >60)
GFR calc non Af Amer: 52 mL/min — ABNORMAL LOW (ref >60)
Glucose, Bld: 121 mg/dL — ABNORMAL HIGH (ref 70–99)
Potassium: 4.4 mmol/L (ref 3.5–5.1)
Sodium: 127 mmol/L — ABNORMAL LOW (ref 135–145)

## 2019-01-29 LAB — CBC
HCT: 26.6 % — ABNORMAL LOW (ref 36.0–46.0)
Hemoglobin: 9.6 g/dL — ABNORMAL LOW (ref 12.0–15.0)
MCH: 35.8 pg — ABNORMAL HIGH (ref 26.0–34.0)
MCHC: 36.1 g/dL — ABNORMAL HIGH (ref 30.0–36.0)
MCV: 99.3 fL (ref 80.0–100.0)
Platelets: 169 10*3/uL (ref 150–400)
RBC: 2.68 MIL/uL — ABNORMAL LOW (ref 3.87–5.11)
RDW: 13.2 % (ref 11.5–15.5)
WBC: 10.4 10*3/uL (ref 4.0–10.5)
nRBC: 0 % (ref 0.0–0.2)

## 2019-01-29 MED ORDER — DILTIAZEM HCL ER COATED BEADS 360 MG PO CP24
360.0000 mg | ORAL_CAPSULE | Freq: Every day | ORAL | Status: DC
  Administered 2019-01-29 – 2019-02-03 (×6): 360 mg via ORAL
  Filled 2019-01-29 (×6): qty 1

## 2019-01-29 NOTE — Progress Notes (Signed)
Patient ID: Regina Ortiz, female   DOB: [birth year], 73 y.o.   MRN: [id number] Vital signs are stable Postoperative labs show hematocrit of 26 consistent with acute blood loss anemia but given vital signs are stable will not need transfusion Renal function and electrolytes are stable Incision is clean and dry Patient is having localized back pain and some dysesthesias in the legs this is completely tolerable Continue to mobilize today we will see how she does that she gets out of bed Stable postop

## 2019-01-29 NOTE — Progress Notes (Signed)
Rehab Admissions Coordinator Note:  Per PT and OT recommendation, this patient was screened by Jhonnie Garner for appropriateness for an Inpatient Acute Rehab Consult.  At this time, we are recommending Inpatient Rehab consult. AC will contact MD to request an Order.   Jhonnie Garner 01/29/2019, 3:09 PM  I can be reached at [phone].

## 2019-01-29 NOTE — Progress Notes (Signed)
PT/OT evaluated patient and is recommending CIR. Contacted Dr. Ellene Route via phone and he will place order for CIR consult. Simmie Davies RN

## 2019-01-29 NOTE — Evaluation (Addendum)
Physical Therapy Evaluation Patient Details Name: Regina Ortiz MRN: [id number] DOB: [birth year] Today's Date: 01/29/2019   History of Present Illness  Patient is a 73 yo female s/p POSTERIOR LUMBAR FUSION LUMBAR FOUR - LUMBAR FIVE WITH FUSION. Pmh of GERD, RA, COPD, anxiety, depression, and hyponatrimia  Clinical Impression  Orders received for PT evaluation. Patient demonstrates deficits in functional mobility as indicated below. Will benefit from continued skilled PT to address deficits and maximize function. Will see as indicated and progress as tolerated.  Prior to admission patient was fiercely independent, at this time patient requires assist and reinforcement for mobility and precautions. Feel patient would benefit from comprehensive therapies to maximize recovery and reinforce mobility with precautions in preparation for d/c home.    Follow Up Recommendations CIR    Equipment Recommendations  Rolling walker with 5" wheels;3in1 (PT)    Recommendations for Other Services Rehab consult     Precautions / Restrictions Precautions Precautions: Back Precaution Booklet Issued: Yes (comment) Precaution Comments: provided handout Required Braces or Orthoses: Spinal Brace Spinal Brace: Lumbar corset Restrictions Weight Bearing Restrictions: (P) No      Mobility  Bed Mobility Overal bed mobility: Needs Assistance Bed Mobility: Rolling;Sidelying to Sit Rolling: Min guard Sidelying to sit: Min guard       General bed mobility comments: min guard for safety VCs for technique  Transfers Overall transfer level: Needs assistance Equipment used: Rolling walker (2 wheeled) Transfers: Sit to/from Stand Sit to Stand: Min assist         General transfer comment: min assist/min guard for safety  Ambulation/Gait Ambulation/Gait assistance: Min guard Gait Distance (Feet): 24 Feet Assistive device: Rolling walker (2 wheeled) Gait Pattern/deviations: Decreased stride  length;Shuffle;Step-to pattern;Drifts right/left Gait velocity: decreased   General Gait Details: patient with noted instability heavy reliance on RW.  Stairs            Wheelchair Mobility    Modified Rankin (Stroke Patients Only)       Balance Overall balance assessment: Needs assistance Sitting-balance support: Feet supported Sitting balance-Leahy Scale: Fair     Standing balance support: During functional activity Standing balance-Leahy Scale: Fair                               Pertinent Vitals/Pain Pain Assessment: Faces Faces Pain Scale: Hurts even more Pain Location: low back and legs Pain Descriptors / Indicators: Sore Pain Intervention(s): Monitored during session    Home Living Family/patient expects to be discharged to:: Private residence Living Arrangements: Alone   Type of Home: Other(Comment)(townhome) Home Access: Stairs to enter   Entrance Stairs-Number of Steps: 2 Home Layout: Two level;1/2 bath on main level Home Equipment: None      Prior Function Level of Independence: Independent         Comments: bathes in sink in kitchen     Hand Dominance        Extremity/Trunk Assessment   Upper Extremity Assessment Upper Extremity Assessment: Generalized weakness    Lower Extremity Assessment Lower Extremity Assessment: Generalized weakness(LLE sensory deficits compares to RLE)    Cervical / Trunk Assessment Cervical / Trunk Assessment: (s/p spinal surgery)  Communication   Communication: No difficulties  Cognition Arousal/Alertness: Awake/alert Behavior During Therapy: WFL for tasks assessed/performed Overall Cognitive Status: Within Functional Limits for tasks assessed  General Comments      Exercises     Assessment/Plan    PT Assessment Patient needs continued PT services  PT Problem List Decreased strength;Decreased activity tolerance;Decreased  balance;Decreased mobility;Decreased coordination;Decreased safety awareness;Decreased knowledge of precautions;Pain       PT Treatment Interventions DME instruction;Gait training;Stair training;Functional mobility training;Therapeutic activities;Therapeutic exercise;Balance training;Neuromuscular re-education;Patient/family education    PT Goals (Current goals can be found in the Care Plan section)  Acute Rehab PT Goals Patient Stated Goal: to go home PT Goal Formulation: With patient Time For Goal Achievement: 02/12/19 Potential to Achieve Goals: Good    Frequency Min 5X/week   Barriers to discharge        Co-evaluation PT/OT/SLP Co-Evaluation/Treatment: Yes Reason for Co-Treatment: For patient/therapist safety PT goals addressed during session: Mobility/safety with mobility OT goals addressed during session: ADL's and self-care       AM-PAC PT "6 Clicks" Mobility  Outcome Measure Help needed turning from your back to your side while in a flat bed without using bedrails?: A Little Help needed moving from lying on your back to sitting on the side of a flat bed without using bedrails?: A Little Help needed moving to and from a bed to a chair (including a wheelchair)?: A Little Help needed standing up from a chair using your arms (e.g., wheelchair or bedside chair)?: A Little Help needed to walk in hospital room?: A Little Help needed climbing 3-5 steps with a railing? : A Lot 6 Click Score: 17    End of Session Equipment Utilized During Treatment: Gait belt;Back brace Activity Tolerance: Patient tolerated treatment well;Patient limited by pain Patient left: in chair;with call bell/phone within reach;with chair alarm set Nurse Communication: Mobility status PT Visit Diagnosis: Unsteadiness on feet (R26.81);Difficulty in walking, not elsewhere classified (R26.2)    Time: 2330-0762 PT Time Calculation (min) (ACUTE ONLY): 23 min   Charges:   PT Evaluation $PT Eval  Moderate Complexity: 1 Mod          Alben Deeds, PT DPT  Board Certified Neurologic Specialist Acute Rehabilitation Services Pager [phone] Office [place] 01/29/2019, 10:59 AM

## 2019-01-29 NOTE — Progress Notes (Signed)
Orthopedic Tech Progress Note Patient Details:  Regina Ortiz [birth year] [id number]  Patient ID: Regina Ortiz, female   DOB: February 05, 1946, 73 y.o.   MRN: [id number]   [name] 01/29/2019, 9:25 [facility] for lumbar brace.

## 2019-01-29 NOTE — Progress Notes (Signed)
CSW consulted for SNF placement. Per PT recommendation patient is recommended CIR. CSW signing off at this time, please re-consult for future social work needs.  Lamonte Richer, LCSW, [place] Worker II [phone]

## 2019-01-29 NOTE — Anesthesia Postprocedure Evaluation (Signed)
Anesthesia Post Note  Patient: Regina Ortiz  Procedure(s) Performed: POSTERIOR LUMBAR FUSION LUMBAR FOUR - LUMBAR FIVE WITH FUSION. (N/A Back)     Patient location during evaluation: PACU Anesthesia Type: General Level of consciousness: awake and alert Pain management: pain level controlled Vital Signs Assessment: post-procedure vital signs reviewed and stable Respiratory status: spontaneous breathing, nonlabored ventilation, respiratory function stable and patient connected to nasal cannula oxygen Cardiovascular status: blood pressure returned to baseline and stable Postop Assessment: no apparent nausea or vomiting Anesthetic complications: no    Last Vitals:  Vitals:   01/29/19 0404 01/29/19 0805  BP: (!) 157/79 (!) 175/78  Pulse: 88 88  Resp: 16 18  Temp: 36.8 C 36.9 C  SpO2: 98% 99%    Last Pain:  Vitals:   01/29/19 0805  TempSrc: Oral  PainSc:                  Regina Ortiz

## 2019-01-29 NOTE — Evaluation (Signed)
Occupational Therapy Evaluation Patient Details Name: Regina Ortiz MRN: [id number] DOB: 01-21-1946 Today's Date: 01/29/2019    History of Present Illness Patient is a 73 yo female s/p POSTERIOR LUMBAR FUSION LUMBAR FOUR - LUMBAR FIVE WITH FUSION. Pmh of GERD, RA, COPD, anxiety, depression, and hyponatrimia   Clinical Impression   Pt was independent prior to admission. Presents with post op pain, generalized weakness and poor standing balance. She requires min assist for ADL and mobility with RW. Pt has excellent potential to return home at a modified independent level with intensive rehab. Will follow acutely.    Follow Up Recommendations  CIR    Equipment Recommendations  3 in 1 bedside commode    Recommendations for Other Services       Precautions / Restrictions Precautions Precautions: Back Precaution Booklet Issued: Yes (comment) Precaution Comments: provided handout Required Braces or Orthoses: Spinal Brace Spinal Brace: Lumbar corset Restrictions Weight Bearing Restrictions: No      Mobility Bed Mobility Overal bed mobility: Needs Assistance Bed Mobility: Rolling;Sidelying to Sit Rolling: Min guard Sidelying to sit: Min guard       General bed mobility comments: min guard for safety VCs for technique  Transfers Overall transfer level: Needs assistance Equipment used: Rolling walker (2 wheeled) Transfers: Sit to/from Stand Sit to Stand: Min assist         General transfer comment: min assist/min guard for safety    Balance Overall balance assessment: Needs assistance Sitting-balance support: Feet supported Sitting balance-Leahy Scale: Fair     Standing balance support: During functional activity Standing balance-Leahy Scale: Fair                             ADL either performed or assessed with clinical judgement   ADL Overall ADL's : Needs assistance/impaired Eating/Feeding: Independent;Sitting   Grooming: Min  guard;Standing;Wash/dry hands   Upper Body Bathing: Set up;Sitting   Lower Body Bathing: Minimal assistance;Sit to/from stand   Upper Body Dressing : Set up;Sitting   Lower Body Dressing: Minimal assistance;Sit to/from stand   Toilet Transfer: Min guard;Ambulation;RW;BSC           Functional mobility during ADLs: Minimal assistance;Rolling walker       Vision Baseline Vision/History: Wears glasses Wears Glasses: At all times Patient Visual Report: No change from baseline       Perception     Praxis      Pertinent Vitals/Pain Pain Assessment: Faces Faces Pain Scale: Hurts even more Pain Location: low back and legs Pain Descriptors / Indicators: Sore Pain Intervention(s): Monitored during session     Hand Dominance Right   Extremity/Trunk Assessment Upper Extremity Assessment Upper Extremity Assessment: Overall WFL for tasks assessed(RA changes in hands)   Lower Extremity Assessment Lower Extremity Assessment: Defer to PT evaluation   Cervical / Trunk Assessment Cervical / Trunk Assessment: (s/p spinal surgery)   Communication Communication Communication: No difficulties   Cognition Arousal/Alertness: Awake/alert Behavior During Therapy: Flat affect Overall Cognitive Status: Within Functional Limits for tasks assessed                                     General Comments       Exercises     Shoulder Instructions      Home Living Family/patient expects to be discharged to:: Private residence Living Arrangements: Alone   Type of Home:  Other(Comment)(townhome) Home Access: Stairs to enter Entrance Stairs-Number of Steps: 2   Home Layout: Two level;1/2 bath on main level Alternate Level Stairs-Number of Steps: flight   Bathroom Shower/Tub: Teacher, early years/pre: Standard     Home Equipment: None          Prior Functioning/Environment Level of Independence: Independent        Comments: bathes in sink in  kitchen        OT Problem List: Decreased strength;Decreased activity tolerance;Impaired balance (sitting and/or standing);Decreased knowledge of use of DME or AE;Pain      OT Treatment/Interventions: Self-care/ADL training;DME and/or AE instruction;Patient/family education;Balance training;Therapeutic activities    OT Goals(Current goals can be found in the care plan section) Acute Rehab OT Goals Patient Stated Goal: to go home OT Goal Formulation: With patient Time For Goal Achievement: 02/11/19 Potential to Achieve Goals: Good ADL Goals Pt Will Perform Grooming: with modified independence;standing Pt Will Perform Lower Body Bathing: with modified independence;sit to/from stand Pt Will Perform Lower Body Dressing: with modified independence;sit to/from stand Pt Will Transfer to Toilet: with modified independence;ambulating;bedside commode Pt Will Perform Toileting - Clothing Manipulation and hygiene: with modified independence;sitting/lateral leans Pt Will Perform Tub/Shower Transfer: with modified independence;ambulating;rolling walker Additional ADL Goal #1: pt will generalize back precautions. Additional ADL Goal #2: pt will don and doff back brace independently.  OT Frequency: Min 2X/week   Barriers to D/C: Decreased caregiver support          Co-evaluation   Reason for Co-Treatment: For patient/therapist safety PT goals addressed during session: Mobility/safety with mobility OT goals addressed during session: ADL's and self-care      AM-PAC OT "6 Clicks" Daily Activity     Outcome Measure Help from another person eating meals?: None Help from another person taking care of personal grooming?: A Little Help from another person toileting, which includes using toliet, bedpan, or urinal?: A Little Help from another person bathing (including washing, rinsing, drying)?: A Little Help from another person to put on and taking off regular upper body clothing?: A Little Help  from another person to put on and taking off regular lower body clothing?: A Little 6 Click Score: 19   End of Session Nurse Communication: Mobility status  Activity Tolerance: Patient tolerated treatment well Patient left: in chair;with call bell/phone within reach;with chair alarm set  OT Visit Diagnosis: Other abnormalities of gait and mobility (R26.89);Unsteadiness on feet (R26.81);Pain                Time: 5188-4166 OT Time Calculation (min): 27 min Charges:  OT General Charges $OT Visit: 1 Visit OT Evaluation $OT Eval Moderate Complexity: [place], OTR/L Acute Rehabilitation Services Pager: [phone] Office: [phone]  Malka So 01/29/2019, 11:04 AM

## 2019-01-30 MED ORDER — SODIUM CHLORIDE 0.9 % IV BOLUS
500.0000 mL | Freq: Once | INTRAVENOUS | Status: AC
  Administered 2019-01-30: 500 mL via INTRAVENOUS

## 2019-01-30 NOTE — Progress Notes (Addendum)
Pt still unable to void, bladder scan performed 187ml and 22ml, pt up to [facility] multiple times but unable to void. MD paged, new orders received from Dr. Ellene Route for bolus.

## 2019-01-30 NOTE — Progress Notes (Signed)
Patient ID: Regina Ortiz, female   DOB: Nov 20, 1945, 73 y.o.   MRN: [id number] Vital signs are stable and patient is awake and alert.  She feels her strength is improving though her left leg still feels quite numb and weak.  She has not been able to void until this morning when she voided spontaneously however volume could not be recorded.  Hopeful that bladder function is returning to normal.  I believe she would be a good candidate for inpatient rehabilitation and will place a rehabilitation medicine consult.  She is improving steadily

## 2019-01-30 NOTE — Progress Notes (Signed)
2300 Pt still has not voided since around 1700 today when she was straight cathed. Pt assisted up to [facility], unable to void, pt bladder scanned, about 128ml. WCTM

## 2019-01-31 NOTE — Progress Notes (Addendum)
Patient ID: Regina Ortiz, female   DOB: [birth year], 73 y.o.   MRN: [id number] I will signs are stable Motor and function appears to be improving steadily She is ambulatory with the assistance of a walker Her urinary retention continues to be problematic In discussing this with her it appears to be a somewhat chronic problem she has been requiring frequent intermittent caths I will obtain a urology consultation to manage this issue better.

## 2019-01-31 NOTE — Progress Notes (Signed)
Inpatient Rehab Admissions:  Inpatient Rehab Consult received.  I met with pt at the bedside for rehabilitation assessment and to discuss goals and expectations of an inpatient rehab admission.  Pt is interested in CIR and verbalizes that she feels she needs rehab in order to return home safely at this time. AC has spoken to her son who states she will have intermittent support from her neighbors at [place]. AC will begin insurance authorization for possible admit.   Please call if questions.  Jhonnie Garner, OTR/L  Rehab Admissions Coordinator  [phone] 01/31/2019 11:02 AM

## 2019-01-31 NOTE — Consult Note (Signed)
   [facility] Inpatient Consult   01/31/2019  DER GAGLIANO April 25, 1946 [id number]    Patient'schart has been reviewed as benefitfrom her [employer] plan and fora high risk score of 24%for unplanned readmission and hospitalization with 2 ED visitsin thelast 6 months. Noted patient was outreached by [facility] Telephonic care management coordinator for EMMI follow-up call in the past.  History and physical dated 5/29/20and chart review, show asfollows:  Patient is a 73 year old individual who has had a longstanding history of a herniated nucleus pulposus at L4-L5.  She had been evaluated in October 2019 and an MRI then demonstrated presence of herniated nucleus pulposus.  She notes that she was getting along reasonably well using only some Aleve for pain control however, in the past 3 weeks she developed severely worsening back and leg pain with weakness in the left lower extremity. She was seen in the emergency department on Tuesday and was given some prednisone which did not seem to alleviate the pain much and she feels her legs have gotten progressively weaker.     Patient presented to the emergency department, was noted with severely weak lower extremities and had significant urinary retention, has noted decreased sensation in the perineal region. MRI demonstrates that there has been progression of a large disc herniation and advanced degeneration of the disc space. Was advised that the she likely need surgery sooner than later because she is developing a cauda equina syndrome. (status post L4-L5 laminectomy and decompression, lumbar fusion)  PT and OT reviewed notes, currently recommend inpatient level rehab therapies ([facility]- [facility]). Per Inpatient Rehab Admissions Coordinator note, patient is interested in CIR and verbalizes that she feels she needs rehab in order to return home safely, and her son stated she will have intermittent support from her neighbors at discharge.   Will followalong fordisposition.Ifthere are any changes in dispositionand needs for appropriate community follow-up,please referto [facility] care management.  Of note, [facility] Care Management services does not replace or interfere with any services.  Primary care provider is Dr. Maurice Small with [place] [facility], listed as providing transition of care follow-up.   For questions and additional information, please call:  Armaan Pond A. Naelle Diegel, BSN, RN-BC [facility] Liaison Cell: [phone]

## 2019-01-31 NOTE — Progress Notes (Signed)
Inpatient Rehabilitation-Admissions Coordinator   [facility] has received denial by [employer]. At this time, [facility] would recommend SNF to allow her a longer time for recovery as she will need to navigate a flight of stairs to get to bedroom/bathroom and does not have much physical assist per her neighbors.  AC will follow up with pt tomorrow to discuss insurance outcome and will communicate recommendation to SW/CM.   Please call if questions.   Jhonnie Garner, OTR/L  Rehab Admissions Coordinator  [phone] 01/31/2019 6:36 PM

## 2019-01-31 NOTE — Progress Notes (Signed)
Physical Therapy Treatment Patient Details Name: Regina Ortiz MRN: [id number] DOB: 1945-12-21 Today's Date: 01/31/2019    History of Present Illness Patient is a 73 yo female s/p POSTERIOR LUMBAR FUSION LUMBAR FOUR - LUMBAR FIVE WITH FUSION. Pmh of GERD, RA, COPD, anxiety, depression, and hyponatrimia    PT Comments    Pt with improved ambulation tolerance today however groggy with impaired memory this date. Pt re-educated on back precautions several times, at best pt able to remember 2/3 with increased time. Pt with 4-5/10 pain in back and is cont to requiring assist with transfers, amb, and ADLs. Pt to cont to benefit from CIR upon d/c to achieve safe mod I level of function for safe transition home. Pt cont to demo excellent rehab potential. Acute PT to cont to follow.    Follow Up Recommendations  CIR     Equipment Recommendations  Rolling walker with 5" wheels;3in1 (PT)    Recommendations for Other Services Rehab consult     Precautions / Restrictions Precautions Precautions: Back Precaution Booklet Issued: Yes (comment) Precaution Comments: provided handout Required Braces or Orthoses: Spinal Brace Spinal Brace: Lumbar corset Restrictions Weight Bearing Restrictions: No    Mobility  Bed Mobility Overal bed mobility: Needs Assistance Bed Mobility: Rolling;Sidelying to Sit Rolling: Min guard Sidelying to sit: Min assist       General bed mobility comments: max directional verbal cues, definite use of bed rail, minA for trunk elevation due to increased pain  Transfers Overall transfer level: Needs assistance Equipment used: Rolling walker (2 wheeled) Transfers: Sit to/from Stand Sit to Stand: Min assist         General transfer comment: minA to power up, verbal cues for hand placement, minA to steady during transition of hands  Ambulation/Gait Ambulation/Gait assistance: Min assist Gait Distance (Feet): 130 Feet Assistive device: Rolling walker (2  wheeled) Gait Pattern/deviations: Step-through pattern;Decreased stride length Gait velocity: dec Gait velocity interpretation: <1.31 ft/sec, indicative of household ambulator General Gait Details: minA for walker mangement due to vearing to the L, pt more unsteady today than evaluation however more lethargic/groggy   Stairs             Wheelchair Mobility    Modified Rankin (Stroke Patients Only)       Balance Overall balance assessment: Needs assistance Sitting-balance support: Feet supported Sitting balance-Leahy Scale: Fair     Standing balance support: During functional activity Standing balance-Leahy Scale: Poor Standing balance comment: needs RW for safe amb                            Cognition Arousal/Alertness: Awake/alert(pt reports "i'm just so groggy this morning") Behavior During Therapy: Flat affect Overall Cognitive Status: Impaired/Different from baseline Area of Impairment: Problem solving;Memory                     Memory: Decreased short-term memory       Problem Solving: Slow processing;Difficulty sequencing;Requires verbal cues;Requires tactile cues General Comments: pt with reports of feeling groggy this morning, pt with delayed response time and difficulty remembering precautions despite max verbal cues      Exercises      General Comments General comments (skin integrity, edema, etc.): pt with dressing on incision, no drainage      Pertinent Vitals/Pain Pain Assessment: 0-10 Pain Score: 4  Pain Location: at incision site, increased to 5/10 during ambulation Pain Descriptors / Indicators: Sore Pain Intervention(s):  Monitored during session    Home Living                      Prior Function            PT Goals (current goals can now be found in the care plan section) Progress towards PT goals: Progressing toward goals    Frequency    Min 5X/week      PT Plan Current plan remains  appropriate    Co-evaluation              AM-PAC PT "6 Clicks" Mobility   Outcome Measure  Help needed turning from your back to your side while in a flat bed without using bedrails?: A Little Help needed moving from lying on your back to sitting on the side of a flat bed without using bedrails?: A Little Help needed moving to and from a bed to a chair (including a wheelchair)?: A Little Help needed standing up from a chair using your arms (e.g., wheelchair or bedside chair)?: A Little Help needed to walk in hospital room?: A Little Help needed climbing 3-5 steps with a railing? : A Lot 6 Click Score: 17    End of Session Equipment Utilized During Treatment: Gait belt;Back brace Activity Tolerance: Patient tolerated treatment well;Patient limited by pain Patient left: in chair;with call bell/phone within reach;with chair alarm set Nurse Communication: Mobility status PT Visit Diagnosis: Unsteadiness on feet (R26.81);Difficulty in walking, not elsewhere classified (R26.2)     Time: 1157-2620 PT Time Calculation (min) (ACUTE ONLY): 22 min  Charges:  $Gait Training: 8-22 mins                     Regina Ortiz, PT, DPT Acute Rehabilitation Services Pager #: [phone] Office #: [phone]    [name] 01/31/2019, 8:20 AM

## 2019-02-01 NOTE — TOC Progression Note (Signed)
Transition of Care [facility]) - Progression Note    Patient Details  Name: OMIE FERGER MRN: [id number] Date of Birth: 1945-11-05  Transition of Care [facility]) CM/SW [place], [place] Phone Number: 02/01/2019, 4:35 PM  Clinical Narrative:    CSW spoke with pt son Shanon Brow via telephone at [phone]. CSW explained process for SNF placement and why it is recommended. He is in agreement, understands we will need SNF choice and auth. He understands visitation policy of no visitors at this time at SNFs.  Requested list of offers be given to pt and to him at [email or web address]. Will send full list for consideration.   Expected Discharge Plan: Skilled Nursing Facility Barriers to Discharge: [place] (PASRR), Continued Medical Work up, Orthoptist and Services Expected Discharge Plan: [place] In-house Referral: Clinical Social Work Discharge Planning Services: NA Post Acute Care Choice: [place] Living arrangements for the past 2 months: Apartment                                       Social Determinants of Health (SDOH) Interventions    Readmission Risk Interventions No flowsheet data found.

## 2019-02-01 NOTE — NC FL2 (Signed)
MEDICAID FL2 LEVEL OF CARE SCREENING TOOL     IDENTIFICATION  Patient Name: Regina Ortiz Birthdate: April 22, 1946 Sex: female Admission Date (Current Location): 01/28/2019  [facility] and [place] Number:  Herbalist and Address:  [facility], [place] [address], [place], [place] [address]      Provider Number: [id number]  Attending Physician Name and Address:  Kristeen Miss, MD  Relative Name and Phone Number:  Arlee Muslim, son, [phone]    Current Level of Care: Hospital Recommended Level of Care: [place] Prior Approval Number:    Date Approved/Denied:   PASRR Number: pending  Discharge Plan: SNF    Current Diagnoses: Patient Active Problem List   Diagnosis Date Noted  . Cauda equina syndrome ([place]) 01/28/2019  . Chest pain 09/25/2017  . Hypertensive urgency 09/24/2017  . Headache 09/24/2017  . Alcohol use 09/24/2017  . AKI (acute kidney injury) ([place]) 09/24/2017  . Bilateral carotid artery stenosis   . Vulvovaginal candidiasis 10/22/2015  . Mild malnutrition ([place]) 07/07/2013  . Chronic bronchitis ([place]) 07/07/2013  . Tobacco abuse 07/07/2013  . Hypertension   . Short of breath on exertion   . Hemorrhoid   . LBBB (left bundle branch block)   . Anxiety   . Depression   . RA (rheumatoid arthritis) ([place])   . Venous insufficiency of leg   . Chronic hyponatremia   . AIN grade I 04/05/2013  . Diarrhea 09/22/2012  . Hyponatremia 09/22/2012  . Hypokalemia 09/22/2012  . ARF (acute renal failure) ([place]) 09/22/2012  . Atypical chest pain 06/22/2012  . Anemia 06/22/2012  . Vulvar intraepithelial neoplasia III (VIN III) 01/29/2012    Orientation RESPIRATION BLADDER Height & Weight     Self, Situation, Time, Place  Normal Incontinent, Indwelling catheter(foley for retention) Weight: 99 lb 3.3 oz (45 kg) Height:  5\' 1"  (154.9 cm)  BEHAVIORAL SYMPTOMS/MOOD NEUROLOGICAL BOWEL NUTRITION STATUS      Incontinent  Diet(see discharge summary)  AMBULATORY STATUS COMMUNICATION OF NEEDS Skin   Limited Assist Verbally Surgical wounds, Skin abrasions, Other (Comment), PU Stage and Appropriate Care(incision on back with dressing; generalized ecchymosis on arms and legs; skin tear on right arm; stage 1 on coccyx) PU Stage 1 Dressing: (on coccyx with foam)                     Personal Care Assistance Level of Assistance  Bathing, Feeding, Dressing Bathing Assistance: Limited assistance Feeding assistance: Independent Dressing Assistance: Limited assistance     Functional Limitations Info  Sight, Hearing, Speech Sight Info: Adequate Hearing Info: Adequate Speech Info: Adequate    SPECIAL CARE FACTORS FREQUENCY  PT (By licensed PT), OT (By licensed OT)     PT Frequency: 5x week OT Frequency: 5x week            Contractures Contractures Info: Not present    Additional Factors Info  Code Status, Allergies, Psychotropic Code Status Info: Full Code Allergies Info: LATEX, AUGMENTIN AMOXICILLIN-POT CLAVULANATE, DOXYCYCLINE, LACTOSE INTOLERANCE (GI), LIALDA MESALAMINE, PRINIVIL LISINOPRIL, TRIAMCINOLONE ACETONIDE, AMLODIPINE, LOTENSIN BENAZEPRIL HCL  Psychotropic Info: busPIRone (BUSPAR) tablet 30 mg 2x daily PO; traZODone (DESYREL) tablet 100 mg daily at bedtime PO         Current Medications (02/01/2019):  This is the current hospital active medication list Current Facility-Administered Medications  Medication Dose Route Frequency Provider Last Rate Last Dose  . 0.9 %  sodium chloride infusion  250 mL Intravenous Continuous  Kristeen Miss, MD      . acetaminophen (TYLENOL) tablet 650 mg  650 mg Oral Q4H PRN Kristeen Miss, MD   650 mg at 02/01/19 1147   Or  . acetaminophen (TYLENOL) suppository 650 mg  650 mg Rectal Q4H PRN Kristeen Miss, MD      . ALPRAZolam Duanne Moron) tablet 0.5 mg  0.5 mg Oral BID PRN Kristeen Miss, MD   0.5 mg at 01/30/19 2223  . alum & mag hydroxide-simeth (MAALOX/MYLANTA)  200-200-20 MG/5ML suspension 30 mL  30 mL Oral Q6H PRN Kristeen Miss, MD      . atorvastatin (LIPITOR) tablet 40 mg  40 mg Oral Daily Kristeen Miss, MD   40 mg at 02/01/19 0805  . bisacodyl (DULCOLAX) suppository 10 mg  10 mg Rectal Daily PRN Kristeen Miss, MD   10 mg at 02/01/19 0124  . busPIRone (BUSPAR) tablet 30 mg  30 mg Oral BID Kristeen Miss, MD   30 mg at 02/01/19 0804  . cyclobenzaprine (FLEXERIL) tablet 10 mg  10 mg Oral TID PRN Kristeen Miss, MD   10 mg at 02/01/19 1147  . diltiazem (CARDIZEM CD) 24 hr capsule 360 mg  360 mg Oral Daily Kristeen Miss, MD   360 mg at 02/01/19 0900  . docusate sodium (COLACE) capsule 100 mg  100 mg Oral BID Kristeen Miss, MD   100 mg at 01/31/19 2102  . hydrALAZINE (APRESOLINE) tablet 50 mg  50 mg Oral TID Kristeen Miss, MD   50 mg at 02/01/19 0806  . HYDROcodone-acetaminophen (NORCO/VICODIN) 5-325 MG per tablet 1-2 tablet  1-2 tablet Oral Q4H PRN Kristeen Miss, MD   2 tablet at 02/01/19 0754  . hydroxychloroquine (PLAQUENIL) tablet 200 mg  200 mg Oral Daily Kristeen Miss, MD   200 mg at 02/01/19 0804  . lactated ringers infusion   Intravenous Continuous Kristeen Miss, MD      . lactated ringers infusion   Intravenous Continuous Kristeen Miss, MD 75 mL/hr at 01/29/19 1538    . losartan (COZAAR) tablet 100 mg  100 mg Oral Daily Kristeen Miss, MD   100 mg at 02/01/19 0804  . menthol-cetylpyridinium (CEPACOL) lozenge 3 mg  1 lozenge Oral PRN Kristeen Miss, MD       Or  . phenol (CHLORASEPTIC) mouth spray 1 spray  1 spray Mouth/Throat PRN Kristeen Miss, MD      . morphine 2 MG/ML injection 2 mg  2 mg Intravenous Q2H PRN Kristeen Miss, MD      . ondansetron (ZOFRAN) tablet 4 mg  4 mg Oral Q6H PRN Kristeen Miss, MD       Or  . ondansetron (ZOFRAN) injection 4 mg  4 mg Intravenous Q6H PRN Kristeen Miss, MD      . polyethylene glycol (MIRALAX / GLYCOLAX) packet 17 g  17 g Oral Daily PRN Kristeen Miss, MD      . potassium chloride (K-DUR) CR tablet 5 mEq  5 mEq  Oral Antoine Poche, MD   5 mEq at 02/01/19 0144  . predniSONE (DELTASONE) tablet 40 mg  40 mg Oral Q breakfast Kristeen Miss, MD   40 mg at 02/01/19 0754  . saccharomyces boulardii (FLORASTOR) capsule 250 mg  250 mg Oral Daily Kristeen Miss, MD   250 mg at 02/01/19 0805  . senna (SENOKOT) tablet 8.6 mg  1 tablet Oral BID Kristeen Miss, MD   8.6 mg at 01/31/19 2102  . sodium chloride flush (NS) 0.9 % injection 3 mL  3 mL Intravenous Austin Miles, MD   3 mL at 02/01/19 0805  . sodium chloride flush (NS) 0.9 % injection 3 mL  3 mL Intravenous PRN Kristeen Miss, MD      . sodium phosphate (FLEET) 7-19 GM/118ML enema 1 enema  1 enema Rectal Once PRN Kristeen Miss, MD      . spironolactone (ALDACTONE) tablet 12.5 mg  12.5 mg Oral Daily Kristeen Miss, MD   12.5 mg at 02/01/19 0805  . traZODone (DESYREL) tablet 100 mg  100 mg Oral Antoine Poche, MD   100 mg at 01/31/19 2102     Discharge Medications: Please see discharge summary for a list of discharge medications.  Relevant Imaging Results:  Relevant Lab Results:   Additional Information SS[id number] [place], [place]

## 2019-02-01 NOTE — TOC Initial Note (Signed)
Transition of Care [facility]) - Initial/Assessment Note    Patient Details  Name: Regina Ortiz MRN: [id number] Date of Birth: 03/22/1946  Transition of Care [facility]) CM/SW Contact:    Alexander Mt, [place] Phone Number: 02/01/2019, 12:44 PM  Clinical Narrative:                 CSW spoke with pt at bedside. Introduced self, role, reason for visit. Pt understanding that she has been denied by CIR, worried about SNF. Pt from home alone, she has two sons- Shanon Brow is her preferred contact and he communicates with her other son Quillian Quince.   Pt states that she prefers [place] since she lives in town. Pt also requests that CSW speak with pt son Shanon Brow. CSW will reach out to pt son and initiate referral for SNF.   Expected Discharge Plan: Skilled Nursing Facility Barriers to Discharge: [place] (PASRR), Continued Medical Work up, Ship broker   Patient Goals and CMS Choice   CMS Medicare.gov Compare Post Acute Care list provided to:: Patient Choice offered to / list presented to : Patient  Expected Discharge Plan and Services Expected Discharge Plan: [place] In-house Referral: Clinical Social Work Discharge Planning Services: NA Post Acute Care Choice: [place] Living arrangements for the past 2 months: Apartment                    Prior Living Arrangements/Services Living arrangements for the past 2 months: Apartment Lives with:: Self Patient language and need for interpreter reviewed:: Yes(no needs) Do you feel safe going back to the place where you live?: Yes      Need for Family Participation in Patient Care: Yes (Comment)(support with decision making) Care giver support system in place?: Yes (comment)(adult children)   Criminal Activity/Legal Involvement Pertinent to Current Situation/Hospitalization: No - Comment as needed  Activities of Daily Living Home Assistive Devices/Equipment: None ADL Screening (condition at time of  admission) Patient's cognitive ability adequate to safely complete daily activities?: Yes Is the patient deaf or have difficulty hearing?: No Does the patient have difficulty seeing, even when wearing glasses/contacts?: No Does the patient have difficulty concentrating, remembering, or making decisions?: No Patient able to express need for assistance with ADLs?: Yes Does the patient have difficulty dressing or bathing?: Yes Independently performs ADLs?: Yes (appropriate for developmental age) Communication: Independent Dressing (OT): Needs assistance Is this a change from baseline?: Change from baseline, expected to last <3days Grooming: Independent Feeding: Independent Bathing: Needs assistance Is this a change from baseline?: Change from baseline, expected to last >3 days Toileting: Needs assistance Is this a change from baseline?: Change from baseline, expected to last >3days In/Out Bed: Needs assistance Is this a change from baseline?: Change from baseline, expected to last >3 days Walks in Home: Independent Does the patient have difficulty walking or climbing stairs?: Yes Weakness of Legs: None Weakness of Arms/Hands: None  Permission Sought/Granted Permission sought to share information with : Facility Sport and exercise psychologist, Family Supports Permission granted to share information with : Yes, Verbal Permission Granted  Share Information with NAME: Arlee Muslim  Permission granted to share info w AGENCY: SNFs  Permission granted to share info w Relationship: son  Permission granted to share info w Contact Information: [phone]  Emotional Assessment Appearance:: Appears stated age Attitude/Demeanor/Rapport: Apprehensive, Engaged Affect (typically observed): Accepting, Appropriate, Apprehensive Orientation: : Oriented to Self, Oriented to Place, Oriented to  Time, Oriented to Situation Alcohol / Substance Use: Alcohol Use,  Tobacco Use Psych Involvement: Outpatient  Provider  Admission diagnosis:  Weakness of left lower extremity [R29.898] Acute bilateral low back pain with left-sided sciatica [M54.42] Patient Active Problem List   Diagnosis Date Noted  . Cauda equina syndrome ([place]) 01/28/2019  . Chest pain 09/25/2017  . Hypertensive urgency 09/24/2017  . Headache 09/24/2017  . Alcohol use 09/24/2017  . AKI (acute kidney injury) ([place]) 09/24/2017  . Bilateral carotid artery stenosis   . Vulvovaginal candidiasis 10/22/2015  . Mild malnutrition ([place]) 07/07/2013  . Chronic bronchitis ([place]) 07/07/2013  . Tobacco abuse 07/07/2013  . Hypertension   . Short of breath on exertion   . Hemorrhoid   . LBBB (left bundle branch block)   . Anxiety   . Depression   . RA (rheumatoid arthritis) ([place])   . Venous insufficiency of leg   . Chronic hyponatremia   . AIN grade I 04/05/2013  . Diarrhea 09/22/2012  . Hyponatremia 09/22/2012  . Hypokalemia 09/22/2012  . ARF (acute renal failure) ([place]) 09/22/2012  . Atypical chest pain 06/22/2012  . Anemia 06/22/2012  . Vulvar intraepithelial neoplasia III (VIN III) 01/29/2012   PCP:  Maurice Small, MD Pharmacy:   [place], [place] - [address] [place] [place] [address] Phone: [phone] Fax: [phone]     Social Determinants of Health (SDOH) Interventions    Readmission Risk Interventions No flowsheet data found.

## 2019-02-01 NOTE — Progress Notes (Signed)
Inpatient Rehabilitation-Admissions Coordinator   Discussed insurance denial with pt. Pt open to SNF placement. AC has updated CM/SW on recommendation.   Please call if questions.   AC will sign off.   Jhonnie Garner, OTR/L  Rehab Admissions Coordinator  [phone] 02/01/2019 10:28 AM

## 2019-02-01 NOTE — Progress Notes (Signed)
Patient ID: Regina Ortiz, female   DOB: 24-Sep-1945, 73 y.o.   MRN: [id number] Signs are stable Motor function is improving steadily Bladder still continues to be a problem Hopefully urology consultation will see her soon

## 2019-02-01 NOTE — Progress Notes (Signed)
Physical Therapy Treatment Patient Details Name: Regina Ortiz MRN: [id number] DOB: 06-01-1946 Today's Date: 02/01/2019    History of Present Illness Patient is a 73 yo female s/p POSTERIOR LUMBAR FUSION LUMBAR FOUR - LUMBAR FIVE WITH FUSION. Pmh of GERD, RA, COPD, anxiety, depression, and hyponatrimia    PT Comments    Pt with improved mentation and alertness. Pt con't to have incision back pain and doesn' tolerate brace well due to pressure on the incision.  Pt functioning at minA level. Per chart insurance denied CIR approval. Pt with need ST-SNF to achieve safe mod I level of function for safe transition home.   Follow Up Recommendations  SNF/ 24/7 supervision    Equipment Recommendations  Rolling walker with 5" wheels;3in1 (PT)    Recommendations for Other Services Rehab consult     Precautions / Restrictions Precautions Precautions: Back Precaution Booklet Issued: Yes (comment) Precaution Comments: provided handout Required Braces or Orthoses: Spinal Brace Spinal Brace: Lumbar corset Restrictions Weight Bearing Restrictions: No    Mobility  Bed Mobility Overal bed mobility: Needs Assistance Bed Mobility: Rolling;Sidelying to Sit Rolling: Min guard Sidelying to sit: Min guard       General bed mobility comments: pt with improved command follow, v/c's for log roll, used bed rail but no physical assist needed  Transfers Overall transfer level: Needs assistance Equipment used: Rolling walker (2 wheeled) Transfers: Sit to/from Stand Sit to Stand: Min guard         General transfer comment: min guard to steady during transition of hands from bed to RW, increased time  Ambulation/Gait Ambulation/Gait assistance: Min guard Gait Distance (Feet): 150 Feet Assistive device: Rolling walker (2 wheeled) Gait Pattern/deviations: Step-through pattern;Decreased stride length Gait velocity: dec Gait velocity interpretation: <1.8 ft/sec, indicate of risk for recurrent  falls General Gait Details: pt with improved walker management today, pt with report "I'm SOB a little"   Stairs             Wheelchair Mobility    Modified Rankin (Stroke Patients Only)       Balance Overall balance assessment: Needs assistance Sitting-balance support: Feet supported Sitting balance-Leahy Scale: Good Sitting balance - Comments: pt able to bring foot to knee and pull up socks   Standing balance support: During functional activity Standing balance-Leahy Scale: Poor Standing balance comment: needs RW for safe amb                            Cognition Arousal/Alertness: Awake/alert Behavior During Therapy: WFL for tasks assessed/performed Overall Cognitive Status: Impaired/Different from baseline Area of Impairment: Problem solving;Memory                     Memory: Decreased short-term memory       Problem Solving: Slow processing;Difficulty sequencing;Requires verbal cues;Requires tactile cues General Comments: pt more alert today however cont to be slow to respond      Exercises      General Comments General comments (skin integrity, edema, etc.): pt assisted into bathroom, minA to pull down underwear      Pertinent Vitals/Pain Pain Assessment: 0-10 Pain Score: 6  Pain Location: at incision site Pain Descriptors / Indicators: Sore Pain Intervention(s): Monitored during session    Home Living                      Prior Function  PT Goals (current goals can now be found in the care plan section) Acute Rehab PT Goals Patient Stated Goal: stop the back pain Progress towards PT goals: Progressing toward goals    Frequency    Min 5X/week      PT Plan Current plan remains appropriate    Co-evaluation              AM-PAC PT "6 Clicks" Mobility   Outcome Measure  Help needed turning from your back to your side while in a flat bed without using bedrails?: A Little Help needed moving  from lying on your back to sitting on the side of a flat bed without using bedrails?: A Little Help needed moving to and from a bed to a chair (including a wheelchair)?: A Little Help needed standing up from a chair using your arms (e.g., wheelchair or bedside chair)?: A Little Help needed to walk in hospital room?: A Little Help needed climbing 3-5 steps with a railing? : A Lot 6 Click Score: 17    End of Session Equipment Utilized During Treatment: Gait belt;Back brace Activity Tolerance: Patient tolerated treatment well;Patient limited by pain Patient left: with call bell/phone within reach(in bathroom, pt notified to pull cord, not to get up on own) Nurse Communication: Mobility status PT Visit Diagnosis: Unsteadiness on feet (R26.81);Difficulty in walking, not elsewhere classified (R26.2)     Time: 5747-3403 PT Time Calculation (min) (ACUTE ONLY): 18 min  Charges:  $Gait Training: 8-22 mins                     Regina Ortiz, PT, DPT Acute Rehabilitation Services Pager #: [phone] Office #: [phone]    Regina Ortiz 02/01/2019, 12:22 PM

## 2019-02-01 NOTE — Plan of Care (Signed)

## 2019-02-02 ENCOUNTER — Telehealth: Payer: Self-pay | Admitting: *Deleted

## 2019-02-02 MED ORDER — NAPROXEN SODIUM 275 MG PO TABS
275.0000 mg | ORAL_TABLET | Freq: Two times a day (BID) | ORAL | Status: DC
  Administered 2019-02-02 – 2019-02-03 (×2): 275 mg via ORAL
  Filled 2019-02-02 (×4): qty 1

## 2019-02-02 NOTE — Progress Notes (Addendum)
Occupational Therapy Treatment Patient Details Name: Regina Ortiz MRN: [id number] DOB: 1946/01/28 Today's Date: 02/02/2019    History of present illness Patient is a 73 yo female s/p POSTERIOR LUMBAR FUSION LUMBAR FOUR - LUMBAR FIVE WITH FUSION. Pmh of GERD, RA, COPD, anxiety, depression, and hyponatrimia   OT comments  Pt with good participation with OT- pt agreeable with SNF  Follow Up Recommendations  SNF    Equipment Recommendations  3 in 1 bedside commode    Recommendations for Other Services      Precautions / Restrictions Precautions Precautions: Back Required Braces or Orthoses: Spinal Brace Spinal Brace: Lumbar corset;Applied in sitting position Restrictions Weight Bearing Restrictions: No       Mobility Bed Mobility Overal bed mobility: Needs Assistance Bed Mobility: Rolling;Sidelying to Sit;Sit to Sidelying Rolling: Min guard Sidelying to sit: Min guard     Sit to sidelying: Min assist General bed mobility comments: light Min A to return LE to bed, otherwise min guard with verbal cueing for sequencing  Transfers Overall transfer level: Needs assistance Equipment used: Rolling walker (2 wheeled) Transfers: Sit to/from Stand Sit to Stand: Min guard         General transfer comment: ming guard for initial standing balance with cueing for hand placement    Balance Overall balance assessment: Needs assistance Sitting-balance support: No upper extremity supported;Feet supported Sitting balance-Leahy Scale: Good     Standing balance support: Bilateral upper extremity supported;During functional activity Standing balance-Leahy Scale: Poor Standing balance comment: needs RW for safe amb                           ADL either performed or assessed with clinical judgement   ADL Overall ADL's : Needs assistance/impaired     Grooming: Standing;Wash/dry hands;Supervision/safety                   Toilet Transfer: Min  guard;Ambulation;RW;Comfort height toilet;Cueing for sequencing;Cueing for safety   Toileting- Clothing Manipulation and Hygiene: Min guard;Sit to/from stand;Cueing for sequencing;Cueing for safety       Functional mobility during ADLs: Rolling walker;Min guard       Vision Patient Visual Report: No change from baseline            Cognition Arousal/Alertness: Awake/alert Behavior During Therapy: WFL for tasks assessed/performed Overall Cognitive Status: Within Functional Limits for tasks assessed                                                     Pertinent Vitals/ Pain       Pain Assessment: Faces Pain Score: 3  Faces Pain Scale: Hurts even more Pain Location: low back Pain Descriptors / Indicators: Burning;Discomfort;Grimacing;Guarding Pain Intervention(s): Limited activity within patient's tolerance;Repositioned     Prior Functioning/Environment              Frequency  Min 2X/week        Progress Toward Goals  OT Goals(current goals can now be found in the care plan section)  Progress towards OT goals: Progressing toward goals  Acute Rehab OT Goals Patient Stated Goal: stop the back pain  Plan Discharge plan remains appropriate       AM-PAC OT "6 Clicks" Daily Activity     Outcome Measure   Help from another person  eating meals?: None Help from another person taking care of personal grooming?: A Little Help from another person toileting, which includes using toliet, bedpan, or urinal?: A Little Help from another person bathing (including washing, rinsing, drying)?: A Little Help from another person to put on and taking off regular upper body clothing?: A Little Help from another person to put on and taking off regular lower body clothing?: A Little 6 Click Score: 19    End of Session Equipment Utilized During Treatment: Rolling walker  OT Visit Diagnosis: Other abnormalities of gait and mobility (R26.89);Unsteadiness on feet  (R26.81);Pain   Activity Tolerance Patient tolerated treatment well   Patient Left with call bell/phone within reach;in bed   Nurse Communication Mobility status        Time: 1749-4496 OT Time Calculation (min): 18 min  Charges: OT General Charges $OT Visit: 1 Visit OT Treatments $Self Care/Home Management : 8-22 mins  Kari Baars, [place] [phone] Office- [place], Edwena Felty D 02/02/2019, 3:16 PM

## 2019-02-02 NOTE — Care Management Important Message (Signed)
Important Message  Patient Details  Name: Regina Ortiz MRN: [id number] Date of Birth: 1946-03-14   Medicare Important Message Given:  Yes    Cha Gomillion 02/02/2019, 1:40 PM

## 2019-02-02 NOTE — Progress Notes (Signed)
Patient ID: Regina Ortiz, female   DOB: Aug 12, 1946, 73 y.o.   MRN: [id number] Vital signs are stable though patient is noting increasing back pain today Incision on back is clean and dry Patient is voiding a little bit better though still notes considerable hesitancy Still awaiting urology consult Placement is at issue We will add some naproxen once daily as an anti-inflammatory

## 2019-02-02 NOTE — TOC Progression Note (Signed)
Transition of Care [facility]) - Progression Note    Patient Details  Name: Regina Ortiz MRN: [id number] Date of Birth: [birth year]  Transition of Care [facility]) CM/SW [place], [place] Phone Number: 02/02/2019, 8:40 AM  Clinical Narrative:    Offers emailed to [email or web address]. Will also bring list to pt. PASRR pending, 30 day note to be signed will be on the pt's shadow chart.    Expected Discharge Plan: Skilled Nursing Facility Barriers to Discharge: [place] (PASRR), Continued Medical Work up, Orthoptist and Services Expected Discharge Plan: [place] In-house Referral: Clinical Social Work Discharge Planning Services: NA Post Acute Care Choice: [place] Living arrangements for the past 2 months: Apartment         Social Determinants of Health (SDOH) Interventions    Readmission Risk Interventions No flowsheet data found.

## 2019-02-02 NOTE — Plan of Care (Signed)

## 2019-02-02 NOTE — TOC Progression Note (Addendum)
Transition of Care [facility]) - Progression Note    Patient Details  Name: Regina Ortiz MRN: [id number] Date of Birth: May 31, 1946  Transition of Care [facility]) CM/SW [place], [place] Phone Number: 02/02/2019, 11:56 AM  Clinical Narrative:    1:46pm- Pt son returned call, has selected [facility]. Admissions liaison Claiborne Billings is aware.   1:10pm- pt son Shanon Brow had left message on CSW's secure voicemail, returned call, no answer. HIPAA compliant voice message left. Pt insurance Josem Kaufmann has been initiated with [employer].   11:46am- Pt given CMS list of offers as well, she states she does not feel well today. We discussed need to look at options and discuss with son Shanon Brow who also has offers. Pt aware when MD determines medical stability we will need to have arrangments for DC at that point.    Expected Discharge Plan: Skilled Nursing Facility Barriers to Discharge: [place] (PASRR), Continued Medical Work up, Orthoptist and Services Expected Discharge Plan: [place] In-house Referral: Clinical Social Work Discharge Planning Services: NA Post Acute Care Choice: [place] Living arrangements for the past 2 months: Apartment      Social Determinants of Health (SDOH) Interventions    Readmission Risk Interventions No flowsheet data found.

## 2019-02-02 NOTE — Telephone Encounter (Signed)
    COVID-19 Pre-Screening Questions:  . In the past 7 to 10 days have you had a cough,  shortness of breath, headache, congestion, fever (100 or greater) body aches, chills, sore throat, or sudden loss of taste or sense of smell? . Have you been around anyone with known Covid 19. . Have you been around anyone who is awaiting Covid 19 test results in the past 7 to 10 days? . Have you been around anyone who has been exposed to Covid 19, or has mentioned symptoms of Covid 19 within the past 7 to 10 days?  If you have any concerns/questions about symptoms patients report during screening (either on the phone or at threshold). Contact the provider seeing the patient or DOD for further guidance.  If neither are available contact a member of the leadership team.       Contacted patient via phone call. Got messaging service. Awaiting return call. KN

## 2019-02-02 NOTE — Progress Notes (Signed)
Physical Therapy Treatment Patient Details Name: Regina Ortiz MRN: [id number] DOB: 1945-09-30 Today's Date: 02/02/2019    History of Present Illness Patient is a 73 yo female s/p POSTERIOR LUMBAR FUSION LUMBAR FOUR - LUMBAR FIVE WITH FUSION. Pmh of GERD, RA, COPD, anxiety, depression, and hyponatrimia    PT Comments    Patient seen for mobility progression. Received in bed and agreeable to PT session. Patient requiring Min guard to come to EOB with cueing for sequencing - Min A to return LE to supine. Patient able to don/doff spinal brace in sitting with verbal and tactile cueing. Ambulating in hallway with RW - short step lengths with cueing for RW management and obstacle navigation. Patient reporting back pain and general fatigue limiting. Will continue to follow.     Follow Up Recommendations  CIR;SNF     Equipment Recommendations  Rolling walker with 5" wheels;3in1 (PT)    Recommendations for Other Services       Precautions / Restrictions Precautions Precautions: Back Required Braces or Orthoses: Spinal Brace Spinal Brace: Lumbar corset;Applied in sitting position Restrictions Weight Bearing Restrictions: No    Mobility  Bed Mobility Overal bed mobility: Needs Assistance Bed Mobility: Rolling;Sidelying to Sit;Sit to Sidelying Rolling: Min guard Sidelying to sit: Min guard     Sit to sidelying: Min assist General bed mobility comments: light Min A to return LE to bed, otherwise min guard with verbal cueing for sequencing  Transfers Overall transfer level: Needs assistance Equipment used: Rolling walker (2 wheeled) Transfers: Sit to/from Stand Sit to Stand: Min guard         General transfer comment: ming guard for initial standing balance with cueing for hand placement  Ambulation/Gait Ambulation/Gait assistance: Min guard Gait Distance (Feet): 150 Feet(x2) Assistive device: Rolling walker (2 wheeled) Gait Pattern/deviations: Step-through pattern;Decreased  stride length Gait velocity: decreased   General Gait Details: patient with short staggered steps; cueing for RW management and obstacle navigation    Stairs             Wheelchair Mobility    Modified Rankin (Stroke Patients Only)       Balance Overall balance assessment: Needs assistance Sitting-balance support: No upper extremity supported;Feet supported Sitting balance-Leahy Scale: Good     Standing balance support: Bilateral upper extremity supported;During functional activity Standing balance-Leahy Scale: Poor Standing balance comment: needs RW for safe amb                            Cognition Arousal/Alertness: Awake/alert Behavior During Therapy: WFL for tasks assessed/performed Overall Cognitive Status: Within Functional Limits for tasks assessed                                        Exercises      General Comments        Pertinent Vitals/Pain Pain Assessment: Faces Faces Pain Scale: Hurts even more Pain Location: low back Pain Descriptors / Indicators: Burning;Discomfort;Grimacing;Guarding Pain Intervention(s): Limited activity within patient's tolerance;Monitored during session;Repositioned    Home Living                      Prior Function            PT Goals (current goals can now be found in the care plan section) Acute Rehab PT Goals Patient Stated Goal: stop the back  pain PT Goal Formulation: With patient Time For Goal Achievement: 02/12/19 Potential to Achieve Goals: Good Progress towards PT goals: Progressing toward goals    Frequency    Min 5X/week      PT Plan Current plan remains appropriate    Co-evaluation              AM-PAC PT "6 Clicks" Mobility   Outcome Measure  Help needed turning from your back to your side while in a flat bed without using bedrails?: A Little Help needed moving from lying on your back to sitting on the side of a flat bed without using bedrails?:  A Little Help needed moving to and from a bed to a chair (including a wheelchair)?: A Little Help needed standing up from a chair using your arms (e.g., wheelchair or bedside chair)?: A Little Help needed to walk in hospital room?: A Little Help needed climbing 3-5 steps with a railing? : A Lot 6 Click Score: 17    End of Session Equipment Utilized During Treatment: Gait belt;Back brace Activity Tolerance: Patient tolerated treatment well;Patient limited by pain Patient left: in bed;with call bell/phone within reach Nurse Communication: Mobility status PT Visit Diagnosis: Unsteadiness on feet (R26.81);Difficulty in walking, not elsewhere classified (R26.2)     Time: 0177-9390 PT Time Calculation (min) (ACUTE ONLY): 28 min  Charges:  $Gait Training: 8-22 mins $Therapeutic Activity: 8-22 mins                     Lanney Gins, PT, DPT Supplemental Physical Therapist 02/02/19 2:19 PM Pager: [phone] Office: [phone]

## 2019-02-02 NOTE — Social Work (Signed)
Pt has received approval for admission to [facility] within 5 business days from today. Auth #[id number].   Pt PASRR still pending, pt 30 day note on the chart behind RN Secretary station to be signed.   CSW continuing to follow for support with disposition when medically appropriate.  Westley Hummer, MSW, [place] Work [phone]

## 2019-02-03 ENCOUNTER — Other Ambulatory Visit: Payer: PPO

## 2019-02-03 DIAGNOSIS — Z111 Encounter for screening for respiratory tuberculosis: Secondary | ICD-10-CM | POA: Diagnosis not present

## 2019-02-03 DIAGNOSIS — R339 Retention of urine, unspecified: Secondary | ICD-10-CM | POA: Diagnosis not present

## 2019-02-03 DIAGNOSIS — J449 Chronic obstructive pulmonary disease, unspecified: Secondary | ICD-10-CM | POA: Diagnosis not present

## 2019-02-03 DIAGNOSIS — M069 Rheumatoid arthritis, unspecified: Secondary | ICD-10-CM | POA: Diagnosis not present

## 2019-02-03 DIAGNOSIS — K219 Gastro-esophageal reflux disease without esophagitis: Secondary | ICD-10-CM | POA: Diagnosis not present

## 2019-02-03 DIAGNOSIS — M6281 Muscle weakness (generalized): Secondary | ICD-10-CM | POA: Diagnosis not present

## 2019-02-03 DIAGNOSIS — I10 Essential (primary) hypertension: Secondary | ICD-10-CM | POA: Diagnosis not present

## 2019-02-03 DIAGNOSIS — G834 Cauda equina syndrome: Secondary | ICD-10-CM | POA: Diagnosis not present

## 2019-02-03 DIAGNOSIS — M5489 Other dorsalgia: Secondary | ICD-10-CM | POA: Diagnosis not present

## 2019-02-03 DIAGNOSIS — M62838 Other muscle spasm: Secondary | ICD-10-CM | POA: Diagnosis not present

## 2019-02-03 DIAGNOSIS — E569 Vitamin deficiency, unspecified: Secondary | ICD-10-CM | POA: Diagnosis not present

## 2019-02-03 DIAGNOSIS — F419 Anxiety disorder, unspecified: Secondary | ICD-10-CM | POA: Diagnosis not present

## 2019-02-03 DIAGNOSIS — E58 Dietary calcium deficiency: Secondary | ICD-10-CM | POA: Diagnosis not present

## 2019-02-03 DIAGNOSIS — R52 Pain, unspecified: Secondary | ICD-10-CM | POA: Diagnosis not present

## 2019-02-03 DIAGNOSIS — K5900 Constipation, unspecified: Secondary | ICD-10-CM | POA: Diagnosis not present

## 2019-02-03 DIAGNOSIS — R279 Unspecified lack of coordination: Secondary | ICD-10-CM | POA: Diagnosis not present

## 2019-02-03 DIAGNOSIS — F329 Major depressive disorder, single episode, unspecified: Secondary | ICD-10-CM | POA: Diagnosis not present

## 2019-02-03 DIAGNOSIS — Z743 Need for continuous supervision: Secondary | ICD-10-CM | POA: Diagnosis not present

## 2019-02-03 DIAGNOSIS — Z4789 Encounter for other orthopedic aftercare: Secondary | ICD-10-CM | POA: Diagnosis not present

## 2019-02-03 DIAGNOSIS — E785 Hyperlipidemia, unspecified: Secondary | ICD-10-CM | POA: Diagnosis not present

## 2019-02-03 DIAGNOSIS — R2689 Other abnormalities of gait and mobility: Secondary | ICD-10-CM | POA: Diagnosis not present

## 2019-02-03 DIAGNOSIS — R0902 Hypoxemia: Secondary | ICD-10-CM | POA: Diagnosis not present

## 2019-02-03 MED ORDER — HYDROCODONE-ACETAMINOPHEN 5-325 MG PO TABS: 1.0000 | ORAL_TABLET | ORAL | 0 refills | Status: DC | PRN

## 2019-02-03 MED ORDER — NAPROXEN SODIUM 275 MG PO TABS: 275.0000 mg | ORAL_TABLET | Freq: Two times a day (BID) | ORAL | 2 refills | Status: DC

## 2019-02-03 NOTE — TOC Transition Note (Signed)
Transition of Care [facility]) - CM/SW Discharge Note   Patient Details  Name: Regina Ortiz MRN: [id number] Date of Birth: 06-10-1946  Transition of Care [facility]) CM/SW Contact:  Alexander Mt, [place] Phone Number: 02/03/2019, 8:44 AM   Clinical Narrative:    Pt stable for discharge, aware summary, order and prescription signed. Pt will need 30 day note signed, MD messaged. Aware MD in the OR, let RN know that we need to hold DC until PASRR information submitted.    Final next level of care: Skilled Nursing Facility Barriers to Discharge: [place] [facility])   Patient Goals and CMS Choice Patient states their goals for this hospitalization and ongoing recovery are:: to feel better CMS Medicare.gov Compare Post Acute Care list provided to:: Patient Choice offered to / list presented to : Patient, Adult Children  Discharge Placement PASRR number recieved: 02/03/19            Patient chooses bed at: [facility] Patient to be transferred to facility by: [place] Name of family member notified: pt son Shanon Brow Patient and family notified of of transfer: 02/03/19  Discharge Plan and Services In-house Referral: Clinical Social Work Discharge Planning Services: NA Post Acute Care Choice: [place]                               Social Determinants of Health (SDOH) Interventions     Readmission Risk Interventions No flowsheet data found.

## 2019-02-03 NOTE — Progress Notes (Signed)
RN called MD about missing prescriptions for discharge. RN went over missing prescriptions for controlled substances- Xanax, ultram, and oxycodone. MD verbally stated that he only wants pt to discharge with written Hydrocodone-acetaminophen (Norco/Vicodin) 5-325 MG prescription. This is written and signed by MD. MD currently in surgery, he is unable to edit discharge summary at this time. Pt stable for discharge.  Catalina Lunger, RN

## 2019-02-03 NOTE — Progress Notes (Signed)
Pt transported via stretcher to rehab.  Pt in stable condition.

## 2019-02-03 NOTE — Social Work (Addendum)
PASRR documents uploaded. Pt son called, pt aware of dc. Pt missing scripts for Xanax, Ultram, and Oxycodone- even if these are home meds they will need hard scripts given that they are controlled substances. Charge RN Varney Biles aware, will call MD.  Clinical Social Worker facilitated patient discharge including contacting patient family and facility to confirm patient discharge plans.  Clinical information faxed to facility and family agreeable with plan.  CSW arranged ambulance transport via [facility] to [facility] at 2:45pm RN to call [phone]  with report prior to discharge.  Clinical Social Worker will sign off for now as social work intervention is no longer needed. Please consult [place] again if new need arises.  Westley Hummer, MSW, [place] Social Worker [phone]

## 2019-02-03 NOTE — Consult Note (Signed)
   [facility] CM Inpatient Consult   02/03/2019  Regina Ortiz 1945-12-27 [id number]    Follow-up note:  Follow-up update on patient's disposition to CIR. Review of patient's chart revealed denial by HTA and was recommended for SNF (skilled nursing facility) to allow her longer time for recovery.  Patient was approved for admission to [facility] SNF (patient/son's choice) per transition of care SW note.  No community follow up assessed for needs at this time.   For questions or needs please contact:   Edwena Felty A. Martin Smeal, BSN, RN-BC [facility] Liaison Cell: [phone]

## 2019-02-03 NOTE — Progress Notes (Signed)
Physical Therapy Treatment Patient Details Name: Regina Ortiz MRN: [id number] DOB: 04/17/1946 Today's Date: 02/03/2019    History of Present Illness Patient is a 73 yo female s/p POSTERIOR LUMBAR FUSION LUMBAR FOUR - LUMBAR FIVE WITH FUSION. Pmh of GERD, RA, COPD, anxiety, depression, and hyponatrimia    PT Comments    Patient making good progress towards goals. Patient requiring cueing to maintain back precautions as well as brace application. Patient continues to require RW for UE support during ambulation for safe mobility. Will continue to follow.    Follow Up Recommendations  SNF     Equipment Recommendations  Rolling walker with 5" wheels;3in1 (PT)    Recommendations for Other Services       Precautions / Restrictions Precautions Precautions: Back Precaution Comments: reviewed back precautions Required Braces or Orthoses: Spinal Brace Spinal Brace: Lumbar corset;Applied in sitting position Restrictions Weight Bearing Restrictions: No    Mobility  Bed Mobility Overal bed mobility: Needs Assistance Bed Mobility: Rolling;Sidelying to Sit Rolling: Min guard Sidelying to sit: Min guard       General bed mobility comments: Min guard to sit EOB for trunk control and stability  Transfers Overall transfer level: Needs assistance Equipment used: Rolling walker (2 wheeled) Transfers: Sit to/from Stand Sit to Stand: Min guard         General transfer comment: min guard from bed and toilet - cueing for hand placement and saefty throughout  Ambulation/Gait Ambulation/Gait assistance: Min guard Gait Distance (Feet): 200 Feet Assistive device: Rolling walker (2 wheeled) Gait Pattern/deviations: Step-through pattern;Decreased stride length Gait velocity: decreased   General Gait Details: improved step length today; continues to rely on RW for UE support   Stairs             Wheelchair Mobility    Modified Rankin (Stroke Patients Only)       Balance  Overall balance assessment: Needs assistance Sitting-balance support: No upper extremity supported;Feet supported Sitting balance-Leahy Scale: Good     Standing balance support: Bilateral upper extremity supported;During functional activity Standing balance-Leahy Scale: Poor Standing balance comment: needs RW for safe amb                            Cognition Arousal/Alertness: Awake/alert Behavior During Therapy: WFL for tasks assessed/performed Overall Cognitive Status: Within Functional Limits for tasks assessed                                        Exercises      General Comments        Pertinent Vitals/Pain Pain Assessment: Faces Faces Pain Scale: Hurts little more Pain Location: low back Pain Descriptors / Indicators: Aching;Discomfort;Grimacing;Guarding Pain Intervention(s): Limited activity within patient's tolerance;Monitored during session;Repositioned    Home Living                      Prior Function            PT Goals (current goals can now be found in the care plan section) Acute Rehab PT Goals Patient Stated Goal: stop the back pain PT Goal Formulation: With patient Time For Goal Achievement: 02/12/19 Potential to Achieve Goals: Good Progress towards PT goals: Progressing toward goals    Frequency    Min 5X/week      PT Plan Current plan remains appropriate  Co-evaluation              AM-PAC PT "6 Clicks" Mobility   Outcome Measure  Help needed turning from your back to your side while in a flat bed without using bedrails?: A Little Help needed moving from lying on your back to sitting on the side of a flat bed without using bedrails?: A Little Help needed moving to and from a bed to a chair (including a wheelchair)?: A Little Help needed standing up from a chair using your arms (e.g., wheelchair or bedside chair)?: A Little Help needed to walk in hospital room?: A Little Help needed climbing  3-5 steps with a railing? : A Lot 6 Click Score: 17    End of Session Equipment Utilized During Treatment: Gait belt;Back brace Activity Tolerance: Patient tolerated treatment well Patient left: in chair;with call bell/phone within reach;with chair alarm set Nurse Communication: Mobility status PT Visit Diagnosis: Unsteadiness on feet (R26.81);Difficulty in walking, not elsewhere classified (R26.2)     Time: 1006-1030 PT Time Calculation (min) (ACUTE ONLY): 24 min  Charges:  $Gait Training: 8-22 mins $Therapeutic Activity: 8-22 mins                      Lanney Gins, PT, DPT Supplemental Physical Therapist 02/03/19 12:09 PM Pager: [phone] Office: [phone]

## 2019-02-03 NOTE — Discharge Summary (Signed)
Physician Discharge Summary  Patient ID: Regina Ortiz MRN: [id number] DOB/AGE: 74-Mar-1947 74 y.o.  Admit date: 01/28/2019 Discharge date: 02/03/2019  Admission Diagnoses: Cauda equina syndrome, herniated nucleus pulposus L4-L5.  Severe lumbar stenosis.  Urinary retention  Discharge Diagnoses: Cauda equina syndrome, herniated nucleus pulposus L4-L5.  Severe lumbar stenosis.  Urinary retention Active Problems:   Cauda equina syndrome [facility])   Discharged Condition: fair  Hospital Course: Patient was admitted through the emergency department where she presented with severe pain and weakness in both her lower extremities.  She is also noted to have severe urinary retention with a bladder that was distended to over 800 cc.  She was taken to the operating room emergently on the day of admission where she underwent surgical decompression and stabilization at L4-L5 for a large chronically herniated disc that had enlarged significantly.  She had improved substantially but still has some residual weakness and urinary retention seems to be easing at this time.  Consults: rehabilitation medicine  Significant Diagnostic Studies: None  Treatments: surgery: Decompression and fusion L4-L5  Discharge Exam: Blood pressure (!) 174/68, pulse 98, temperature 98.3 F (36.8 C), temperature source Oral, resp. rate 17, height 5\' 1"  (1.549 m), weight 45 kg, SpO2 97 %. Incision is clean and dry motor function reveals weakness in tibialis anterior 4-5 bilaterally.  Gait is intact with the use of a walker.  Disposition: Discharge disposition: 03-Skilled Nursing Facility       Discharge Instructions    Call MD for:  redness, tenderness, or signs of infection (pain, swelling, redness, odor or green/yellow discharge around incision site)   Complete by:  As directed    Call MD for:  severe uncontrolled pain   Complete by:  As directed    Call MD for:  temperature >100.4   Complete by:  As directed    Diet -  low sodium heart healthy   Complete by:  As directed    Incentive spirometry RT   Complete by:  As directed    Increase activity slowly   Complete by:  As directed      Allergies as of 02/03/2019      Reactions   Latex Swelling, Other (See Comments)    fever blisters   Augmentin [amoxicillin-pot Clavulanate] Nausea And Vomiting   Doxycycline Other (See Comments)   Blurred vision, nervous, unsteady   Lactose Intolerance (gi) Other (See Comments)   Unknown, per pt   Lialda [mesalamine] Diarrhea   Prinivil [lisinopril] Diarrhea   Triamcinolone Acetonide Other (See Comments)   REDNESS AND PAIN   Amlodipine Rash   Lotensin [benazepril Hcl] Rash      Medication List    TAKE these medications   alendronate 70 MG tablet Commonly known as:  FOSAMAX Take 70 mg by mouth once a week.   ALPRAZolam 0.5 MG tablet Commonly known as:  XANAX Take 0.5 mg by mouth 2 (two) times daily as needed.   amoxicillin-clavulanate 875-125 MG tablet Commonly known as:  AUGMENTIN Take 1 tablet by mouth every 12 (twelve) hours.   atorvastatin 40 MG tablet Commonly known as:  LIPITOR Take 1 tablet by mouth once daily   Biotin 5000 MCG Caps Take 5,000 mcg by mouth daily.   busPIRone 30 MG tablet Commonly known as:  BUSPAR Take 30 mg by mouth 2 (two) times daily.   CALTRATE 600+D PO Take 1 tablet by mouth daily.   cyclobenzaprine 10 MG tablet Commonly known as:  FLEXERIL Take 10 mg by  mouth 3 (three) times daily as needed for muscle spasms.   diltiazem 360 MG 24 hr capsule Commonly known as:  TIAZAC Take 360 mg by mouth daily.   Florastor 250 MG capsule Generic drug:  saccharomyces boulardii Take 250 mg by mouth daily.   hydrALAZINE 50 MG tablet Commonly known as:  APRESOLINE Take 50 mg by mouth 3 (three) times daily.   HYDROcodone-acetaminophen 5-325 MG tablet Commonly known as:  NORCO/VICODIN Take 1-2 tablets by mouth every 4 (four) hours as needed for moderate pain or severe  pain. What changed:    how much to take  when to take this  reasons to take this  additional instructions   losartan 100 MG tablet Commonly known as:  COZAAR Take 100 mg by mouth daily.   magnesium (amino acid chelate) 133 MG tablet Take 1 tablet by mouth daily.   metoprolol tartrate 100 MG tablet Commonly known as:  Lopressor Take 1 tablet (100 mg total) by mouth once for 1 dose. Take 1 tablet 2 hours before your CT scan   naproxen sodium 275 MG tablet Commonly known as:  ANAPROX Take 1 tablet (275 mg total) by mouth 2 (two) times daily with a meal. What changed:    medication strength  how much to take  when to take this  reasons to take this   oxyCODONE 5 MG immediate release tablet Commonly known as:  Roxicodone Take 1 tablet (5 mg total) by mouth every 4 (four) hours as needed for severe pain.   PLAQUENIL PO Take 200 mg by mouth daily.   polyethylene glycol 17 g packet Commonly known as:  MIRALAX / GLYCOLAX Take 17 g by mouth daily as needed for mild constipation.   POTASSIMIN PO Take 1 tablet by mouth every evening.   predniSONE 5 MG tablet Commonly known as:  DELTASONE Take 5 mg by mouth daily with breakfast. --  LONG-TERM USE FOR RA   predniSONE 20 MG tablet Commonly known as:  DELTASONE Take 2 tablets (40 mg total) by mouth daily.   prenatal multivitamin Tabs tablet Take 1 tablet by mouth daily at 12 noon.   spironolactone 25 MG tablet Commonly known as:  ALDACTONE Take 0.5 tablets (12.5 mg total) by mouth daily.   traMADol 50 MG tablet Commonly known as:  ULTRAM Take 1 tablet (50 mg total) by mouth every 6 (six) hours as needed.   traZODone 50 MG tablet Commonly known as:  DESYREL Take 100 mg by mouth at bedtime.   Turmeric 500 MG Caps Take 500 mg by mouth daily.   Vitamin D3 25 MCG (1000 UT) Caps Take 1,000 Units by mouth daily.      Contact information for after-discharge care    Destination    [facility]  .   Service:  Skilled Nursing Contact information: [address] [place] [phone]              Signed: Earleen Newport 02/03/2019, 7:45 AM

## 2019-02-03 NOTE — Social Work (Signed)
Progress note from RN sent to [facility] at [phone]. [facility] rescheduled for next available, pt son aware of delay in discharge.   Westley Hummer, MSW, [place] Work [phone]

## 2019-02-14 ENCOUNTER — Inpatient Hospital Stay (HOSPITAL_COMMUNITY)
Admission: EM | Admit: 2019-02-14 | Discharge: 2019-02-22 | DRG: 696 | Disposition: A | Discharge status: Home Health | Payer: PPO | Attending: Student | Admitting: Student

## 2019-02-14 ENCOUNTER — Other Ambulatory Visit: Payer: Self-pay

## 2019-02-14 ENCOUNTER — Encounter (HOSPITAL_COMMUNITY): Payer: Self-pay | Admitting: *Deleted

## 2019-02-14 DIAGNOSIS — Z881 Allergy status to other antibiotic agents status: Secondary | ICD-10-CM

## 2019-02-14 DIAGNOSIS — R330 Drug induced retention of urine: Principal | ICD-10-CM | POA: Diagnosis present

## 2019-02-14 DIAGNOSIS — F329 Major depressive disorder, single episode, unspecified: Secondary | ICD-10-CM | POA: Diagnosis present

## 2019-02-14 DIAGNOSIS — E739 Lactose intolerance, unspecified: Secondary | ICD-10-CM | POA: Diagnosis not present

## 2019-02-14 DIAGNOSIS — I10 Essential (primary) hypertension: Secondary | ICD-10-CM | POA: Diagnosis present

## 2019-02-14 DIAGNOSIS — Z87412 Personal history of vulvar dysplasia: Secondary | ICD-10-CM

## 2019-02-14 DIAGNOSIS — R200 Anesthesia of skin: Secondary | ICD-10-CM | POA: Diagnosis present

## 2019-02-14 DIAGNOSIS — R339 Retention of urine, unspecified: Secondary | ICD-10-CM

## 2019-02-14 DIAGNOSIS — K5909 Other constipation: Secondary | ICD-10-CM | POA: Diagnosis not present

## 2019-02-14 DIAGNOSIS — Z888 Allergy status to other drugs, medicaments and biological substances status: Secondary | ICD-10-CM

## 2019-02-14 DIAGNOSIS — Z681 Body mass index (BMI) 19 or less, adult: Secondary | ICD-10-CM

## 2019-02-14 DIAGNOSIS — Z880 Allergy status to penicillin: Secondary | ICD-10-CM

## 2019-02-14 DIAGNOSIS — Z91040 Latex allergy status: Secondary | ICD-10-CM

## 2019-02-14 DIAGNOSIS — G834 Cauda equina syndrome: Secondary | ICD-10-CM | POA: Diagnosis present

## 2019-02-14 DIAGNOSIS — E440 Moderate protein-calorie malnutrition: Secondary | ICD-10-CM | POA: Diagnosis present

## 2019-02-14 DIAGNOSIS — R109 Unspecified abdominal pain: Secondary | ICD-10-CM | POA: Diagnosis not present

## 2019-02-14 DIAGNOSIS — F419 Anxiety disorder, unspecified: Secondary | ICD-10-CM | POA: Diagnosis not present

## 2019-02-14 DIAGNOSIS — M47816 Spondylosis without myelopathy or radiculopathy, lumbar region: Secondary | ICD-10-CM | POA: Diagnosis present

## 2019-02-14 DIAGNOSIS — Y92009 Unspecified place in unspecified non-institutional (private) residence as the place of occurrence of the external cause: Secondary | ICD-10-CM

## 2019-02-14 DIAGNOSIS — M4807 Spinal stenosis, lumbosacral region: Secondary | ICD-10-CM | POA: Diagnosis not present

## 2019-02-14 DIAGNOSIS — I110 Hypertensive heart disease with heart failure: Secondary | ICD-10-CM | POA: Diagnosis present

## 2019-02-14 DIAGNOSIS — E860 Dehydration: Secondary | ICD-10-CM | POA: Diagnosis present

## 2019-02-14 DIAGNOSIS — M810 Age-related osteoporosis without current pathological fracture: Secondary | ICD-10-CM | POA: Diagnosis not present

## 2019-02-14 DIAGNOSIS — Z1159 Encounter for screening for other viral diseases: Secondary | ICD-10-CM | POA: Diagnosis not present

## 2019-02-14 DIAGNOSIS — J449 Chronic obstructive pulmonary disease, unspecified: Secondary | ICD-10-CM | POA: Diagnosis not present

## 2019-02-14 DIAGNOSIS — D638 Anemia in other chronic diseases classified elsewhere: Secondary | ICD-10-CM | POA: Diagnosis present

## 2019-02-14 DIAGNOSIS — K219 Gastro-esophageal reflux disease without esophagitis: Secondary | ICD-10-CM | POA: Diagnosis present

## 2019-02-14 DIAGNOSIS — E871 Hypo-osmolality and hyponatremia: Secondary | ICD-10-CM | POA: Diagnosis present

## 2019-02-14 DIAGNOSIS — M5117 Intervertebral disc disorders with radiculopathy, lumbosacral region: Secondary | ICD-10-CM | POA: Diagnosis not present

## 2019-02-14 DIAGNOSIS — I5032 Chronic diastolic (congestive) heart failure: Secondary | ICD-10-CM | POA: Diagnosis not present

## 2019-02-14 DIAGNOSIS — F17210 Nicotine dependence, cigarettes, uncomplicated: Secondary | ICD-10-CM | POA: Diagnosis present

## 2019-02-14 DIAGNOSIS — T500X5A Adverse effect of mineralocorticoids and their antagonists, initial encounter: Secondary | ICD-10-CM | POA: Diagnosis present

## 2019-02-14 DIAGNOSIS — M48061 Spinal stenosis, lumbar region without neurogenic claudication: Secondary | ICD-10-CM | POA: Diagnosis not present

## 2019-02-14 DIAGNOSIS — M5417 Radiculopathy, lumbosacral region: Secondary | ICD-10-CM | POA: Diagnosis present

## 2019-02-14 DIAGNOSIS — Z90710 Acquired absence of both cervix and uterus: Secondary | ICD-10-CM

## 2019-02-14 DIAGNOSIS — Z807 Family history of other malignant neoplasms of lymphoid, hematopoietic and related tissues: Secondary | ICD-10-CM

## 2019-02-14 DIAGNOSIS — T465X5A Adverse effect of other antihypertensive drugs, initial encounter: Secondary | ICD-10-CM | POA: Diagnosis present

## 2019-02-14 DIAGNOSIS — M069 Rheumatoid arthritis, unspecified: Secondary | ICD-10-CM | POA: Diagnosis present

## 2019-02-14 DIAGNOSIS — T402X5A Adverse effect of other opioids, initial encounter: Secondary | ICD-10-CM | POA: Diagnosis present

## 2019-02-14 DIAGNOSIS — K5900 Constipation, unspecified: Secondary | ICD-10-CM | POA: Diagnosis present

## 2019-02-14 DIAGNOSIS — Z79899 Other long term (current) drug therapy: Secondary | ICD-10-CM

## 2019-02-14 DIAGNOSIS — R3914 Feeling of incomplete bladder emptying: Secondary | ICD-10-CM | POA: Diagnosis not present

## 2019-02-14 DIAGNOSIS — T481X5A Adverse effect of skeletal muscle relaxants [neuromuscular blocking agents], initial encounter: Secondary | ICD-10-CM | POA: Diagnosis present

## 2019-02-14 DIAGNOSIS — Z03818 Encounter for observation for suspected exposure to other biological agents ruled out: Secondary | ICD-10-CM | POA: Diagnosis not present

## 2019-02-14 DIAGNOSIS — R338 Other retention of urine: Secondary | ICD-10-CM | POA: Diagnosis not present

## 2019-02-14 DIAGNOSIS — R110 Nausea: Secondary | ICD-10-CM | POA: Diagnosis not present

## 2019-02-14 DIAGNOSIS — Z8249 Family history of ischemic heart disease and other diseases of the circulatory system: Secondary | ICD-10-CM

## 2019-02-14 LAB — CBC WITH DIFFERENTIAL/PLATELET
Abs Immature Granulocytes: 0.06 K/uL (ref 0.00–0.07)
Basophils Absolute: 0 K/uL (ref 0.0–0.1)
Basophils Relative: 0 %
Eosinophils Absolute: 0 K/uL (ref 0.0–0.5)
Eosinophils Relative: 0 %
HCT: 24.2 % — ABNORMAL LOW (ref 36.0–46.0)
Hemoglobin: 8.6 g/dL — ABNORMAL LOW (ref 12.0–15.0)
Immature Granulocytes: 1 %
Lymphocytes Relative: 7 %
Lymphs Abs: 0.7 K/uL (ref 0.7–4.0)
MCH: 36 pg — ABNORMAL HIGH (ref 26.0–34.0)
MCHC: 35.5 g/dL (ref 30.0–36.0)
MCV: 101.3 fL — ABNORMAL HIGH (ref 80.0–100.0)
Monocytes Absolute: 0.5 K/uL (ref 0.1–1.0)
Monocytes Relative: 5 %
Neutro Abs: 8.5 K/uL — ABNORMAL HIGH (ref 1.7–7.7)
Neutrophils Relative %: 87 %
Platelets: 221 K/uL (ref 150–400)
RBC: 2.39 MIL/uL — ABNORMAL LOW (ref 3.87–5.11)
RDW: 14.4 % (ref 11.5–15.5)
WBC: 9.8 K/uL (ref 4.0–10.5)
nRBC: 0 % (ref 0.0–0.2)

## 2019-02-14 LAB — URINALYSIS, ROUTINE W REFLEX MICROSCOPIC
Bacteria, UA: NONE SEEN
Bilirubin Urine: NEGATIVE
Glucose, UA: NEGATIVE mg/dL
Hgb urine dipstick: NEGATIVE
Ketones, ur: NEGATIVE mg/dL
Leukocytes,Ua: NEGATIVE
Nitrite: NEGATIVE
Protein, ur: 30 mg/dL — AB
Specific Gravity, Urine: 1.008 (ref 1.005–1.030)
pH: 7 (ref 5.0–8.0)

## 2019-02-14 LAB — BASIC METABOLIC PANEL WITH GFR
Anion gap: 9 (ref 5–15)
BUN: 21 mg/dL (ref 8–23)
CO2: 21 mmol/L — ABNORMAL LOW (ref 22–32)
Calcium: 8.9 mg/dL (ref 8.9–10.3)
Chloride: 88 mmol/L — ABNORMAL LOW (ref 98–111)
Creatinine, Ser: 1.35 mg/dL — ABNORMAL HIGH (ref 0.44–1.00)
GFR calc Af Amer: 45 mL/min — ABNORMAL LOW
GFR calc non Af Amer: 39 mL/min — ABNORMAL LOW
Glucose, Bld: 113 mg/dL — ABNORMAL HIGH (ref 70–99)
Potassium: 4.2 mmol/L (ref 3.5–5.1)
Sodium: 118 mmol/L — CL (ref 135–145)

## 2019-02-14 MED ORDER — SODIUM CHLORIDE 0.9 % IV BOLUS
1000.0000 mL | Freq: Once | INTRAVENOUS | Status: AC
  Administered 2019-02-14: 1000 mL via INTRAVENOUS

## 2019-02-14 NOTE — H&P (Signed)
History and Physical    Regina Ortiz GXQ:[id number] DOB: 1945-11-13 DOA: 02/14/2019  PCP: Maurice Small, MD Patient coming from: Home  Chief Complaint: Urinary retention  HPI: Regina Ortiz is a 73 y.o. female with medical history significant of herniated disc L4/5 with cauda equina syndrome status post surgical decompression on Jan 28, 2019 by Dr. Ellene Route, chronic hyponatremia, COPD, GERD, hypertension, LBBB, macrocytosis, seronegative rheumatoid arthritis, chronic venous insufficiency, and conditions listed below presenting to the hospital for evaluation of urinary retention.  Patient reports having trouble urinating since before her surgery but states since she had her back surgery this problem has been worse.  For the past few days she has not been been able to empty her bladder completely.  Every time she goes to the bathroom it is just a few drops of urine.  This morning around 6:30 AM she passed a few drops of urine but has not urinated since then.  States she is supposed to see a urologist this week.  Also reports having severe constipation for the past few days.  Her last regular bowel movement was several days ago.  She is also having some abdominal fullness/discomfort.  Denies any nausea or vomiting.  For the past few days she has been able to pass just a tiny amount of stool.  States for the past few days her genital region is feeling slightly numb.  States her lumbar back pain has been stable since her surgery.  States she continues to have numbness and tingling in her buttock and legs.  She continues to have weakness in both of her legs, especially on the left side.  Patient is not able to tell me if her symptoms are stable since the surgery or worse.  States her sodium is always low.  No headaches or seizures.  Reports having decreased p.o. intake.  ED Course: Vital signs stable on arrival.  No leukocytosis.  Hemoglobin 8.6, baseline in the 9-11 range.  Sodium 118, previously ranging in the  upper 120s.  BUN 21.  Creatinine 1.3, baseline 1.0-1.1.  UA not suggestive of infection.  Urine culture pending.  Foley inserted in the ED with 1 L urine output.    Review of Systems:  All systems reviewed and apart from history of presenting illness, are negative.  Past Medical History:  Diagnosis Date   AIN grade I    Anxiety    Bruises easily    Chronic diarrhea    Chronic hyponatremia    Colitis    COPD (chronic obstructive pulmonary disease) (HCC)    Depression    GERD (gastroesophageal reflux disease)    History of chronic bronchitis    History of vulvar dysplasia    serveral recurrency's    Hypertension    LBBB (left bundle branch block) W/ PROLONGED PR   CARDIOLOGSIT-  DR Johnsie Cancel-   LOV IN EPIC   Macrocytosis    Nocturia    Osteoporosis    Seronegative rheumatoid arthritis ([place])    Short of breath on exertion    Smokers' cough ([place])    Venous insufficiency of leg    , Edema   VIN III (vulvar intraepithelial neoplasia III)    RECURRENT -    Past Surgical History:  Procedure Laterality Date   APPENDECTOMY  1992   CARDIOVASCULAR STRESS TEST  06-23-2012  DR Jenkins [place]   NORMAL LEXISCAN MYOVIEW/ EF 83%/  NO ISCHEMIA   CO2 LASER APPLICATION  14/48/1856   Procedure: CO2 LASER  APPLICATION;  Surgeon: Janie Morning, MD PHD;  Location: [facility];  Service: Gynecology;  Laterality: N/A;  Laser Vaporization   CO2 LASER APPLICATION N/A 4/0/8144   Procedure: CO2 LASER APPLICATION;  Surgeon: [name], MD;  Location: [facility];  Service: Gynecology;  Laterality: N/A;   CO2 LASER APPLICATION N/A 04/18/5630   Procedure: CO2 LASER APPLICATION of the vulva;  Surgeon: Janie Morning, MD;  Location: [facility];  Service: Gynecology;  Laterality: N/A;   EXCISION VULVAR LESIONS  07/2012   HEMORRHOID SURGERY N/A 04/15/2013   Procedure: Excision of external and internal  hemorrhoid with AIN, Exam under  anesthesia;  Surgeon: Odis Hollingshead, MD;  Location: [facility];  Service: General;  Laterality: N/A;   REPAIR FISTULA IN ANO/  I & D PERIRECTAL ABSCESS  10-26-2002  DR MATTHEW MARTIN   TRANSTHORACIC ECHOCARDIOGRAM  06-23-2012   NORMAL LVSF/ EF 55-60%   VAGINAL HYSTERECTOMY  1997   partial   VULVAR LESION REMOVAL N/A 11/03/2014   Procedure: CO2 LASER OF VULVAR ;  Surgeon: [name], MD;  Location: [place];  Service: Gynecology;  Laterality: N/A;   VULVECTOMY N/A 03/26/2015   Procedure: WIDE LOCA  EXCISION OF VULVAR;  Surgeon: [name], MD;  Location: [place];  Service: Gynecology;  Laterality: N/A;   Wide Local Excision of labia majora  04-04-2010   Right-sided lesion, CO2 ablation of right labia minora     reports that she has been smoking cigarettes. She has a 12.50 pack-year smoking history. She has never used smokeless tobacco. She reports current alcohol use of about 2.0 standard drinks of alcohol per week. She reports that she does not use drugs.  Allergies  Allergen Reactions   Latex Swelling and Other (See Comments)     fever blisters   Augmentin [Amoxicillin-Pot Clavulanate] Nausea And Vomiting   Doxycycline Other (See Comments)    Blurred vision, nervous, unsteady   Lactose Intolerance (Gi) Other (See Comments)    Unknown, per pt   Lialda [Mesalamine] Diarrhea   Prinivil [Lisinopril] Diarrhea   Triamcinolone Acetonide Other (See Comments)    REDNESS AND PAIN   Amlodipine Rash   Lotensin [Benazepril Hcl] Rash    Family History  Problem Relation Age of Onset   Diabetes Mother    Hypertension Father    Lymphoma Son    Cancer Son        lymphoblastic lymphoma    Prior to Admission medications   Medication Sig Start Date End Date Taking? Authorizing Provider  alendronate (FOSAMAX) 70 MG tablet Take 70 mg by mouth once a week. 11/29/18   [provider]  ALPRAZolam Duanne Moron) 0.5 MG tablet Take 0.5 mg by  mouth 2 (two) times daily as needed.  11/08/17   [provider]  amoxicillin-clavulanate (AUGMENTIN) 875-125 MG tablet Take 1 tablet by mouth every 12 (twelve) hours. Patient not taking: Reported on 01/25/2019 09/12/18   Maudie Flakes, MD  atorvastatin (LIPITOR) 40 MG tablet Take 1 tablet by mouth once daily 12/22/18   Jerline Pain, MD  Biotin 5000 MCG CAPS Take 5,000 mcg by mouth daily.    [provider]  busPIRone (BUSPAR) 30 MG tablet Take 30 mg by mouth 2 (two) times daily.  11/20/14   [provider]  Calcium Carbonate-Vitamin D (CALTRATE 600+D PO) Take 1 tablet by mouth daily.     [provider]  Cholecalciferol (VITAMIN D3) 1000 UNITS  CAPS Take 1,000 Units by mouth daily.     [provider]  cyclobenzaprine (FLEXERIL) 10 MG tablet Take 10 mg by mouth 3 (three) times daily as needed for muscle spasms. 01/27/19   [provider]  diltiazem (TIAZAC) 360 MG 24 hr capsule Take 360 mg by mouth daily.     [provider]  hydrALAZINE (APRESOLINE) 50 MG tablet Take 50 mg by mouth 3 (three) times daily. 07/16/18   [provider]  HYDROcodone-acetaminophen (NORCO/VICODIN) 5-325 MG tablet Take 1-2 tablets by mouth every 4 (four) hours as needed for moderate pain or severe pain. 02/03/19   Kristeen Miss, MD  Hydroxychloroquine Sulfate (PLAQUENIL PO) Take 200 mg by mouth daily.     [provider]  losartan (COZAAR) 100 MG tablet Take 100 mg by mouth daily.  10/23/17   [provider]  metoprolol tartrate (LOPRESSOR) 100 MG tablet Take 1 tablet (100 mg total) by mouth once for 1 dose. Take 1 tablet 2 hours before your CT scan 01/12/19 01/12/19  Jerline Pain, MD  naproxen sodium (ANAPROX) 275 MG tablet Take 1 tablet (275 mg total) by mouth 2 (two) times daily with a meal. 02/03/19   Kristeen Miss, MD  oxyCODONE (ROXICODONE) 5 MG immediate release tablet Take 1 tablet (5 mg total) by mouth every 4 (four) hours as needed  for severe pain. Patient not taking: Reported on 01/25/2019 09/12/18   Maudie Flakes, MD  polyethylene glycol (MIRALAX / GLYCOLAX) 17 g packet Take 17 g by mouth daily as needed for mild constipation.    [provider]  Potassium (POTASSIMIN PO) Take 1 tablet by mouth every evening.     [provider]  predniSONE (DELTASONE) 20 MG tablet Take 2 tablets (40 mg total) by mouth daily. 01/25/19   Virgel Manifold, MD  predniSONE (DELTASONE) 5 MG tablet Take 5 mg by mouth daily with breakfast. --  LONG-TERM USE FOR RA    [provider]  Prenatal Vit-Fe Fumarate-FA (PRENATAL MULTIVITAMIN) TABS tablet Take 1 tablet by mouth daily at 12 noon.    [provider]  saccharomyces boulardii (FLORASTOR) 250 MG capsule Take 250 mg by mouth daily.    [provider]  Specialty Vitamins Products (MAGNESIUM, AMINO ACID CHELATE,) 133 MG tablet Take 1 tablet by mouth daily.    [provider]  spironolactone (ALDACTONE) 25 MG tablet Take 0.5 tablets (12.5 mg total) by mouth daily. 01/26/19   Jerline Pain, MD  traMADol (ULTRAM) 50 MG tablet Take 1 tablet (50 mg total) by mouth every 6 (six) hours as needed. Patient not taking: Reported on 01/28/2019 01/25/19   Virgel Manifold, MD  traZODone (DESYREL) 50 MG tablet Take 100 mg by mouth at bedtime.  11/16/17   [provider]  Turmeric 500 MG CAPS Take 500 mg by mouth daily.    [provider]    Physical Exam: Vitals:   02/15/19 0130 02/15/19 0309 02/15/19 0403 02/15/19 0604  BP: (!) 146/64 (!) 174/72 (!) 156/64 (!) 156/64  Pulse: 72 79    Resp:  17    Temp:  98.8 F (37.1 C)    TempSrc:  Oral    SpO2: 97% 100%    Weight:      Height:        Physical Exam  Constitutional: She is oriented to person, place, and time. She appears well-developed and well-nourished. No distress.  HENT:  Head: Normocephalic.  Dry mucous membranes  Eyes: Right eye exhibits no discharge. Left eye exhibits no  discharge.  Neck: Neck supple.  Cardiovascular: Normal rate, regular rhythm and intact distal pulses.  Pulmonary/Chest: Effort normal and breath sounds normal. No respiratory distress. She has no wheezes. She has no rales.  Abdominal: Soft. Bowel sounds are normal. She exhibits distension. There is abdominal tenderness. There is guarding. There is no rebound.  Slightly distended  Musculoskeletal:        General: No edema.  Neurological: She is alert and oriented to person, place, and time.  Strength 5 out of 5 upon plantarflexion and dorsiflexion of bilateral lower extremities.  Strength slightly reduced in the left lower extremity upon raising the leg from the bed.  Sensation to light touch grossly intact in bilateral lower extremities.  Skin: Skin is warm and dry. She is not diaphoretic.  Lumbar spine surgical site: Hematoma noted around the surgical site.  Sutures intact.     Labs on Admission: I have personally reviewed following labs and imaging studies  CBC: Recent Labs  Lab 02/14/19 2059 02/15/19 0609  WBC 9.8 6.6  NEUTROABS 8.5*  --   HGB 8.6* 7.6*  HCT 24.2* 21.2*  MCV 101.3* 100.0  PLT 221 382   Basic Metabolic Panel: Recent Labs  Lab 02/14/19 2059 02/15/19 0609  NA 118* 125*  K 4.2 4.0  CL 88* 99  CO2 21* 21*  GLUCOSE 113* 95  BUN 21 14  CREATININE 1.35* 1.05*  CALCIUM 8.9 8.0*   GFR: Estimated Creatinine Clearance: 35.9 mL/min (A) (by C-G formula based on SCr of 1.05 mg/dL (H)). Liver Function Tests: No results for input(s): AST, ALT, ALKPHOS, BILITOT, PROT, ALBUMIN in the last 168 hours. No results for input(s): LIPASE, AMYLASE in the last 168 hours. No results for input(s): AMMONIA in the last 168 hours. Coagulation Profile: No results for input(s): INR, PROTIME in the last 168 hours. Cardiac Enzymes: No results for input(s): CKTOTAL, CKMB, CKMBINDEX, TROPONINI in the last 168 hours. BNP (last 3 results) No results for input(s): PROBNP in the last  8760 hours. HbA1C: No results for input(s): HGBA1C in the last 72 hours. CBG: No results for input(s): GLUCAP in the last 168 hours. Lipid Profile: No results for input(s): CHOL, HDL, LDLCALC, TRIG, CHOLHDL, LDLDIRECT in the last 72 hours. Thyroid Function Tests: No results for input(s): TSH, T4TOTAL, FREET4, T3FREE, THYROIDAB in the last 72 hours. Anemia Panel: Recent Labs    02/15/19 0609  RETICCTPCT 4.2*   Urine analysis:    Component Value Date/Time   COLORURINE YELLOW 02/14/2019 2141   APPEARANCEUR CLEAR 02/14/2019 2141   LABSPEC 1.008 02/14/2019 2141   PHURINE 7.0 02/14/2019 2141   GLUCOSEU NEGATIVE 02/14/2019 2141   HGBUR NEGATIVE 02/14/2019 2141   BILIRUBINUR NEGATIVE 02/14/2019 2141   [place] NEGATIVE 02/14/2019 2141   PROTEINUR 30 (A) 02/14/2019 2141   UROBILINOGEN 0.2 09/22/2012 1100   NITRITE NEGATIVE 02/14/2019 2141   LEUKOCYTESUR NEGATIVE 02/14/2019 2141    Radiological Exams on Admission: Ct Abdomen Pelvis Wo Contrast  Result Date: 02/15/2019 CLINICAL DATA:  Constipation, urinary retention. Recent back surgery. EXAM: CT ABDOMEN AND PELVIS WITHOUT CONTRAST TECHNIQUE: Multidetector CT imaging of the abdomen and pelvis was performed following the standard protocol without IV contrast. COMPARISON:  None. FINDINGS: Lower chest: Lung bases are clear. No effusions. Heart is normal size. Hepatobiliary: No focal hepatic abnormality. Gallbladder unremarkable. Pancreas: No focal abnormality or ductal dilatation. Spleen: No focal abnormality.  Normal size. Adrenals/Urinary Tract: Perinephric stranding bilaterally.  No hydronephrosis. Adrenal glands unremarkable. Foley catheter within the bladder which is decompressed. Stomach/Bowel: Large stool burden in the colon. Stomach, large and small bowel grossly unremarkable. Vascular/Lymphatic: Diffuse aortic atherosclerosis. No evidence of aneurysm or adenopathy. Reproductive: Prior hysterectomy.  No adnexal masses. Other: No free  fluid or free air. Musculoskeletal: Postoperative changes in the lumbar spine. No acute bony abnormality. IMPRESSION: Mild perinephric stranding bilaterally. No hydronephrosis. Foley catheter within the bladder which is decompressed. Large stool burden in the colon suggesting constipation. No evidence of bowel obstruction. Diffuse aortic atherosclerosis. Electronically Signed   By: Rolm Baptise M.D.   On: 02/15/2019 02:48   Mr Lumbar Spine Wo Contrast  Result Date: 02/15/2019 CLINICAL DATA:  73 y/o F; history of L4-5 herniated disc postsurgical decompression on 05/29 presenting with difficulty urinating. EXAM: MRI LUMBAR SPINE WITHOUT CONTRAST TECHNIQUE: Multiplanar, multisequence MR imaging of the lumbar spine was performed. No intravenous contrast was administered. COMPARISON:  01/28/2019 lumbar spine MRI. FINDINGS: Segmentation:  Standard. Alignment:  Physiologic. Vertebrae: L4-5 posterior instrumented fusion and bilateral laminectomy as well as left subarticular discectomy. There is a small fluid collection within the bilateral laminectomy beds extending into the left epidural space to the disc without significant surrounding bone marrow edema. No findings of acute fracture or discitis. Conus medullaris and cauda equina: Conus extends to the L1 level. Conus and cauda equina appear normal. Paraspinal and other soft tissues: There is edema within the subcutaneous fat and paraspinal muscles as well as well as multiple small multiloculated fluid collections along the posterior aspect of spinous processes extending from L3-L5. Left kidney cyst. Disc levels: T12-L1: No significant disc displacement, foraminal stenosis, or canal stenosis. L1-2: No significant disc displacement, foraminal stenosis, or canal stenosis. L2-3: Mild disc bulge and facet hypertrophy. No significant foraminal or spinal canal stenosis. L3-4: Progressed moderate disc bulge eccentric to the left with facet and ligamentum flavum hypertrophy.  Moderate spinal canal stenosis. Partial effacement of left-greater-than-right lateral recesses. Mild-to-moderate left neural foraminal stenosis. L4-5: Status post laminectomy, discectomy, and posterior fusion. Residual/recurrent central disc extrusion with superior migration to L4 infra pedicular level results in moderate to severe spinal canal stenosis. Additionally, residual disc bulge, endplate marginal osteophytes, and facet hypertrophy moderately narrow the neural foramen bilaterally. L5-S1: Stable disc bulge, endplate marginal osteophytes eccentric to the right neural foramen, as well as facet hypertrophy. Severe right and mild left neural foraminal stenosis. No significant spinal canal stenosis. Partial effacement of lateral recesses. IMPRESSION: 1. L4-5 laminectomy, left subarticular discectomy, and posterior fusion. There are small fluid collections within the laminectomy beds, left L4-5 epidural space discectomy, and along the posterior aspect of spinous processes from L3-L5. No significant associated bone marrow edema, findings are favored to represent postoperative seroma over infection. 2. Residual/recurrent L4-5 central disc extrusion with superior migration resulting in moderate to severe spinal canal stenosis. 3. Progressed disc bulge at L3-4 level with moderate spinal canal stenosis. 4. Multilevel mild and moderate neural foraminal stenosis. Stable severe right L5-S1 foraminal stenosis. Electronically Signed   By: Kristine Garbe M.D.   On: 02/15/2019 02:41    Assessment/Plan Principal Problem:   Urinary retention Active Problems:   Hyponatremia   Hypertension   Lumbosacral radiculopathy   Constipation   Urinary retention, perineal hypoesthesia, lumbosacral radiculopathy Per history provided by the patient, this seems to be a problem since before her surgery on May 29 but now getting progressively worse.  Reports having perineal hypoesthesia for the past few days.  Good rectal  tone  on exam done in the ED.  Patient also reports having slight left lower extremity weakness and symptoms of lumbosacral radiculopathy.  Although unclear from the history whether this problem is acute or chronic.  Denies any worsening back pain.  On exam, strength slightly reduced in the left lower extremity, no gross sensory deficit appreciated. Lumbar MRI showing findings consistent with postop seroma and no significant associated bone marrow edema.  Residual/recurrent L4-5 central disc extrusion with superior migration resulting in moderate to severe spinal canal stenosis.  Progressed disc bulge at L3-4 level with moderate spinal canal stenosis.  Multilevel mild and moderate neural foraminal stenosis.  Stable severe right L5-S1 foraminal stenosis. -Neurosurgery urgently consulted, recommendations pending -Bladder decompressed after Foley catheter was placed.  Continue Foley. -PT evaluation  Acute on chronic hyponatremia Sodium 118, previously ranging in the upper 120s.  No history of vomiting or diarrhea.  Not on a thiazide diuretic.  Appears dehydrated on exam.  Does endorse decreased p.o. intake.  No altered mental status, headaches, vomiting, or seizures. -IV fluid hydration -BMP every 4 hours -Check serum osmolarity -Check urine sodium, urine osmolarity  Constipation CT showing large stool burden suggesting constipation.  No evidence of bowel obstruction. -MiraLAX and Senokot-S  Chronic anemia Hemoglobin 8.6, baseline in the 9-11 range. -Anemia panel  COPD -Stable.  No shortness of breath or wheezing.  DuoNebs PRN.  Hypertension Systolic currently in the 150s. -Hydralazine PRN  Unable to safely order home medications at this time as pharmacy medication reconciliation is pending.   DVT prophylaxis: SCDs Code Status: Patient wishes to be full code. Family Communication: No family available. Disposition Plan: Anticipate discharge after clinical improvement. Consults called:  Called twice and discussed case with Dr. Zada Finders from neurosurgery. Admission status: It is my clinical opinion that referral for OBSERVATION is reasonable and necessary in this patient based on the above information provided. The aforementioned taken together are felt to place the patient at high risk for further clinical deterioration. However it is anticipated that the patient may be medically stable for discharge from the hospital within 24 to 48 hours.  The medical decision making on this patient was of high complexity and the patient is at high risk for clinical deterioration, therefore this is a level 3 visit.  Shela Leff MD [facility] Pager [phone]  If 7PM-7AM, please contact night-coverage [email or web address] Password [place]  02/15/2019, 7:14 AM

## 2019-02-14 NOTE — ED Notes (Signed)
Delayed insertion of foley catheter, pt states she has a latex allergy, staff ordered latex free foley from materials.

## 2019-02-14 NOTE — ED Triage Notes (Signed)
Pt had back surgery on the 29th, then went to rehab until this past Friday. Pt says while she was in rehab she had some difficulty urinating. States she has not urinated since 0630 this morning. Urology appointment scheduled for Wednesday.

## 2019-02-14 NOTE — ED Provider Notes (Signed)
[facility] EMERGENCY DEPARTMENT Provider Note   CSN: [id number] Arrival date & time: 02/14/19  1930    History   Chief Complaint Chief Complaint  Patient presents with  . Urinary Retention    HPI Regina Ortiz is a 73 y.o. female with history of herniated disc L4/5 with cauda equina syndrome s/p surgical decompression on 5/29 by Dr. Ellene Route presents to the ER for difficulty urinating that has been worsening since the surgery but acutely this morning.  Reports chronic difficulty urinating for months before the surgery.  Typically she has little to no urge to urinate and when she urinates only some drops come out and has urgency again only after 15 minutes.  States since this morning she has had no urge to urinate and she was only able to void a few drops of urine at 6:30 AM today and none since.  She has then developed nausea, lower abdominal distention, fullness, pressure.  She feels like the pressure in her abdomen is causing her back pain to be more severe as well.  Has had issues with constipation in the past and more recently in the last week.  She had some MiraLAX and had a small bowel movement today, passing gas.  Chronic left buttock and posterior leg numbness and weakness since before the surgery, unchanged.  She has a urology appointment for Wednesday.  She denies any new falls or back injury.  She denies any groin numbness.  No fevers, chills, vomiting.  No interventions.  No modifying factors.    HPI  Past Medical History:  Diagnosis Date  . AIN grade I   . Anxiety   . Bruises easily   . Chronic diarrhea   . Chronic hyponatremia   . Colitis   . COPD (chronic obstructive pulmonary disease) ([place])   . Depression   . GERD (gastroesophageal reflux disease)   . History of chronic bronchitis   . History of vulvar dysplasia    serveral recurrency's   . Hypertension   . LBBB (left bundle branch block) W/ PROLONGED PR   CARDIOLOGSIT-  DR Johnsie Cancel-   LOV IN EPIC   . Macrocytosis   . Nocturia   . Osteoporosis   . Seronegative rheumatoid arthritis ([place])   . Short of breath on exertion   . Smokers' cough ([place])   . Venous insufficiency of leg    , Edema  . VIN III (vulvar intraepithelial neoplasia III)    RECURRENT -    Patient Active Problem List   Diagnosis Date Noted  . Acute urinary retention 02/14/2019  . Cauda equina syndrome ([place]) 01/28/2019  . Chest pain 09/25/2017  . Hypertensive urgency 09/24/2017  . Headache 09/24/2017  . Alcohol use 09/24/2017  . AKI (acute kidney injury) ([place]) 09/24/2017  . Bilateral carotid artery stenosis   . Vulvovaginal candidiasis 10/22/2015  . Mild malnutrition ([place]) 07/07/2013  . Chronic bronchitis ([place]) 07/07/2013  . Tobacco abuse 07/07/2013  . Hypertension   . Short of breath on exertion   . Hemorrhoid   . LBBB (left bundle branch block)   . Anxiety   . Depression   . RA (rheumatoid arthritis) ([place])   . Venous insufficiency of leg   . Chronic hyponatremia   . AIN grade I 04/05/2013  . Diarrhea 09/22/2012  . Hyponatremia 09/22/2012  . Hypokalemia 09/22/2012  . ARF (acute renal failure) ([place]) 09/22/2012  . Atypical chest pain 06/22/2012  . Anemia 06/22/2012  . Vulvar intraepithelial neoplasia III (  VIN III) 01/29/2012    Past Surgical History:  Procedure Laterality Date  . APPENDECTOMY  1992  . CARDIOVASCULAR STRESS TEST  06-23-2012  DR Jenkins [place]   NORMAL LEXISCAN MYOVIEW/ EF 83%/  NO ISCHEMIA  . CO2 LASER APPLICATION  83/66/2947   Procedure: CO2 LASER APPLICATION;  Surgeon: Janie Morning, MD PHD;  Location[facility];  Service: Gynecology;  Laterality: N/A;  Laser Vaporization  . CO2 LASER APPLICATION N/A 02/03/4649   Procedure: CO2 LASER APPLICATION;  Surgeon: [name], MD;  Location: [facility];  Service: Gynecology;  Laterality: N/A;  . CO2 LASER APPLICATION N/A 3/54/6568   Procedure: CO2 LASER APPLICATION of the vulva;  Surgeon: Janie Morning,  MD;  Location: [facility];  Service: Gynecology;  Laterality: N/A;  . EXCISION VULVAR LESIONS  07/2012  . HEMORRHOID SURGERY N/A 04/15/2013   Procedure: Excision of external and internal  hemorrhoid with AIN, Exam under anesthesia;  Surgeon: Odis Hollingshead, MD;  Location: [facility];  Service: General;  Laterality: N/A;  . REPAIR FISTULA IN ANO/  I & D PERIRECTAL ABSCESS  10-26-2002  DR Johnathan Hausen  . TRANSTHORACIC ECHOCARDIOGRAM  06-23-2012   NORMAL LVSF/ EF 55-60%  . VAGINAL HYSTERECTOMY  1997   partial  . VULVAR LESION REMOVAL N/A 11/03/2014   Procedure: CO2 LASER OF VULVAR ;  Surgeon: [name], MD;  Location: [facility];  Service: Gynecology;  Laterality: N/A;  . VULVECTOMY N/A 03/26/2015   Procedure: WIDE LOCA  EXCISION OF VULVAR;  Surgeon: [name], MD;  Location: [facility];  Service: Gynecology;  Laterality: N/A;  . Wide Local Excision of labia majora  04-04-2010   Right-sided lesion, CO2 ablation of right labia minora     OB History   No obstetric history on file.      Home Medications    Prior to Admission medications   Medication Sig Start Date End Date Taking? Authorizing Provider  alendronate (FOSAMAX) 70 MG tablet Take 70 mg by mouth once a week. 11/29/18   [provider]  ALPRAZolam Duanne Moron) 0.5 MG tablet Take 0.5 mg by mouth 2 (two) times daily as needed.  11/08/17   [provider]  amoxicillin-clavulanate (AUGMENTIN) 875-125 MG tablet Take 1 tablet by mouth every 12 (twelve) hours. Patient not taking: Reported on 01/25/2019 09/12/18   Maudie Flakes, MD  atorvastatin (LIPITOR) 40 MG tablet Take 1 tablet by mouth once daily 12/22/18   Jerline Pain, MD  Biotin 5000 MCG CAPS Take 5,000 mcg by mouth daily.    [provider]  busPIRone (BUSPAR) 30 MG tablet Take 30 mg by mouth 2 (two) times daily.  11/20/14   [provider]  Calcium Carbonate-Vitamin D (CALTRATE 600+D PO) Take 1 tablet  by mouth daily.     [provider]  Cholecalciferol (VITAMIN D3) 1000 UNITS CAPS Take 1,000 Units by mouth daily.     [provider]  cyclobenzaprine (FLEXERIL) 10 MG tablet Take 10 mg by mouth 3 (three) times daily as needed for muscle spasms. 01/27/19   [provider]  diltiazem (TIAZAC) 360 MG 24 hr capsule Take 360 mg by mouth daily.     [provider]  hydrALAZINE (APRESOLINE) 50 MG tablet Take 50 mg by mouth 3 (three) times daily. 07/16/18   [provider]  HYDROcodone-acetaminophen (NORCO/VICODIN) 5-325 MG tablet Take 1-2 tablets by mouth every 4 (four) hours as needed for moderate  pain or severe pain. 02/03/19   Kristeen Miss, MD  Hydroxychloroquine Sulfate (PLAQUENIL PO) Take 200 mg by mouth daily.     [provider]  losartan (COZAAR) 100 MG tablet Take 100 mg by mouth daily.  10/23/17   [provider]  metoprolol tartrate (LOPRESSOR) 100 MG tablet Take 1 tablet (100 mg total) by mouth once for 1 dose. Take 1 tablet 2 hours before your CT scan 01/12/19 01/12/19  Jerline Pain, MD  naproxen sodium (ANAPROX) 275 MG tablet Take 1 tablet (275 mg total) by mouth 2 (two) times daily with a meal. 02/03/19   Kristeen Miss, MD  oxyCODONE (ROXICODONE) 5 MG immediate release tablet Take 1 tablet (5 mg total) by mouth every 4 (four) hours as needed for severe pain. Patient not taking: Reported on 01/25/2019 09/12/18   Maudie Flakes, MD  polyethylene glycol (MIRALAX / GLYCOLAX) 17 g packet Take 17 g by mouth daily as needed for mild constipation.    [provider]  Potassium (POTASSIMIN PO) Take 1 tablet by mouth every evening.     [provider]  predniSONE (DELTASONE) 20 MG tablet Take 2 tablets (40 mg total) by mouth daily. 01/25/19   Virgel Manifold, MD  predniSONE (DELTASONE) 5 MG tablet Take 5 mg by mouth daily with breakfast. --  LONG-TERM USE FOR RA    [provider]  Prenatal Vit-Fe Fumarate-FA (PRENATAL  MULTIVITAMIN) TABS tablet Take 1 tablet by mouth daily at 12 noon.    [provider]  saccharomyces boulardii (FLORASTOR) 250 MG capsule Take 250 mg by mouth daily.    [provider]  Specialty Vitamins Products (MAGNESIUM, AMINO ACID CHELATE,) 133 MG tablet Take 1 tablet by mouth daily.    [provider]  spironolactone (ALDACTONE) 25 MG tablet Take 0.5 tablets (12.5 mg total) by mouth daily. 01/26/19   Jerline Pain, MD  traMADol (ULTRAM) 50 MG tablet Take 1 tablet (50 mg total) by mouth every 6 (six) hours as needed. Patient not taking: Reported on 01/28/2019 01/25/19   Virgel Manifold, MD  traZODone (DESYREL) 50 MG tablet Take 100 mg by mouth at bedtime.  11/16/17   [provider]  Turmeric 500 MG CAPS Take 500 mg by mouth daily.    [provider]    Family History Family History  Problem Relation Age of Onset  . Diabetes Mother   . Hypertension Father   . Lymphoma Son   . Cancer Son        lymphoblastic lymphoma    Social History Social History   Tobacco Use  . Smoking status: Current Every Day Smoker    Packs/day: 0.25    Years: 50.00    Pack years: 12.50    Types: Cigarettes  . Smokeless tobacco: Never Used  Substance Use Topics  . Alcohol use: Yes    Alcohol/week: 2.0 standard drinks    Types: 2 Cans of beer per week  . Drug use: No     Allergies   Latex, Augmentin [amoxicillin-pot clavulanate], Doxycycline, Lactose intolerance (gi), Lialda [mesalamine], Prinivil [lisinopril], Triamcinolone acetonide, Amlodipine, and Lotensin [benazepril hcl]   Review of Systems Review of Systems  Gastrointestinal: Positive for abdominal distention, abdominal pain and nausea.  Genitourinary: Positive for difficulty urinating.  Neurological: Positive for numbness (left buttock and leg, chronic unchanged).  All other systems reviewed and are negative.    Physical Exam Updated Vital Signs BP (!) 151/61   Pulse 82  Temp 97.8 F  (36.6 C) (Oral)   Resp 14   Ht 5\' 4"  (1.626 m)   Wt 47.6 kg   LMP  (LMP Unknown)   SpO2 98%   BMI 18.02 kg/m   Physical Exam Vitals signs and nursing note reviewed.  Constitutional:      Appearance: She is well-developed.     Comments: Non toxic in NAD  HENT:     Head: Normocephalic and atraumatic.     Nose: Nose normal.  Eyes:     Conjunctiva/sclera: Conjunctivae normal.  Neck:     Musculoskeletal: Normal range of motion.  Cardiovascular:     Rate and Rhythm: Normal rate and regular rhythm.  Pulmonary:     Effort: Pulmonary effort is normal.     Breath sounds: Normal breath sounds.  Abdominal:     General: Bowel sounds are normal. There is distension.     Palpations: Abdomen is soft.     Tenderness: There is abdominal tenderness.     Comments: Moderate lower abd tenderness and distention. Active BS in lower quadrants. No. CVA tenderness. Negative Murphy's and McBurney's.  Genitourinary:    Comments:  EMT at bedside during exam. Good rectal tone encountered with good squeeze. No stool in rectal vault. Stool is light brown.  Musculoskeletal: Normal range of motion.  Skin:    General: Skin is warm and dry.     Capillary Refill: Capillary refill takes less than 2 seconds.  Neurological:     Mental Status: She is alert.     Sensory: Sensory deficit present.     Motor: Weakness present.     Comments: Diminished sensation to left buttock and posterior thigh. Diminished strength with left leg raise. Otherwise, strength and sensation intact in lower extremities.   Psychiatric:        Behavior: Behavior normal.      ED Treatments / Results  Labs (all labs ordered are listed, but only abnormal results are displayed) Labs Reviewed  CBC WITH DIFFERENTIAL/PLATELET - Abnormal; Notable for the following components:      Result Value   RBC 2.39 (*)    Hemoglobin 8.6 (*)    HCT 24.2 (*)    MCV 101.3 (*)    MCH 36.0 (*)    Neutro Abs 8.5 (*)    All other components within  normal limits  BASIC METABOLIC PANEL - Abnormal; Notable for the following components:   Sodium 118 (*)    Chloride 88 (*)    CO2 21 (*)    Glucose, Bld 113 (*)    Creatinine, Ser 1.35 (*)    GFR calc non Af Amer 39 (*)    GFR calc Af Amer 45 (*)    All other components within normal limits  URINALYSIS, ROUTINE W REFLEX MICROSCOPIC - Abnormal; Notable for the following components:   Protein, ur 30 (*)    All other components within normal limits  URINE CULTURE  SARS CORONAVIRUS 2 (HOSPITAL ORDER, [facility])    EKG None  Radiology No results found.  Procedures Procedures (including critical care time)  Medications Ordered in ED Medications  sodium chloride 0.9 % bolus 1,000 mL (0 mLs Intravenous Stopped 02/15/19 0024)     Initial Impression / Assessment and Plan / ED Course  I have reviewed the triage vital signs and the nursing notes.  Pertinent labs & imaging results that were available during my care of the patient were reviewed by me and  considered in my medical decision making (see chart for details).  Clinical Course as of Feb 14 25  Mon Feb 14, 2019  2151 GFR, Est Non African American(!): 39 [CG]  2151 Hemoglobin(!): 8.6 [CG]  2244 Sodium(!!): 118 [CG]  2244 Creatinine(!): 1.35 [CG]  2244 Rectal exam showed light brown stool, 9.6 at discharge after surgery   Hemoglobin(!): 8.6 [CG]    Clinical Course User Index [CG] Kinnie Feil, PA-C      73 year old with history of herniated disc L4/5 with cauda equina syndrome status post emergent surgical decompression on 5/29 by Dr. Ellene Route presents to the ER for evaluation of urinary retention.  Last urine void 0630 today.  Has been constipated for the last week, small BM this afternoon.  No fevers.  Chronic left lower extremity numbness and weakness since the surgery, unchanged.  Exam reveals lower abdominal distention, rigidity, tenderness.  EMT noted small stool in diaper, patient  was not aware she had a BM.  No fecal impaction on exam. Good rectal tone.  Work-up reveals creatinine 1.35.  Sodium 118.  Mild hyperglycemia.  Hemoglobin 8.6, 9.6 at discharge after surgery.  Foley inserted with almost 1 L urine output in the ER.  Patient feels significantly better.    2310: Will discuss with hospitalist for admission of hyponatremia.  1 L IV fluids NS given.  She will benefit from urology consult to determine need for Foley.  See no indication for emergent MRI, she has no worsening back pain, new neuro deficits. Discussed with EDP.  Final Clinical Impressions(s) / ED Diagnoses   Final diagnoses:  Urinary retention    ED Discharge Orders    None       Arlean Hopping 02/15/19 0026    Virgel Manifold, MD 02/15/19 1501

## 2019-02-14 NOTE — ED Notes (Signed)
[name] please contact with any updates and when patient is ready to go home. [phone]

## 2019-02-14 NOTE — ED Notes (Signed)
PA UPDATED WITH Na 118

## 2019-02-15 ENCOUNTER — Observation Stay (HOSPITAL_COMMUNITY): Payer: PPO

## 2019-02-15 ENCOUNTER — Encounter (HOSPITAL_COMMUNITY): Payer: Self-pay

## 2019-02-15 DIAGNOSIS — M810 Age-related osteoporosis without current pathological fracture: Secondary | ICD-10-CM | POA: Diagnosis present

## 2019-02-15 DIAGNOSIS — Y92009 Unspecified place in unspecified non-institutional (private) residence as the place of occurrence of the external cause: Secondary | ICD-10-CM | POA: Diagnosis not present

## 2019-02-15 DIAGNOSIS — I10 Essential (primary) hypertension: Secondary | ICD-10-CM | POA: Diagnosis not present

## 2019-02-15 DIAGNOSIS — M47816 Spondylosis without myelopathy or radiculopathy, lumbar region: Secondary | ICD-10-CM | POA: Diagnosis present

## 2019-02-15 DIAGNOSIS — K5900 Constipation, unspecified: Secondary | ICD-10-CM | POA: Diagnosis present

## 2019-02-15 DIAGNOSIS — E871 Hypo-osmolality and hyponatremia: Secondary | ICD-10-CM

## 2019-02-15 DIAGNOSIS — E440 Moderate protein-calorie malnutrition: Secondary | ICD-10-CM | POA: Diagnosis present

## 2019-02-15 DIAGNOSIS — K219 Gastro-esophageal reflux disease without esophagitis: Secondary | ICD-10-CM | POA: Diagnosis present

## 2019-02-15 DIAGNOSIS — K5909 Other constipation: Secondary | ICD-10-CM | POA: Diagnosis present

## 2019-02-15 DIAGNOSIS — R338 Other retention of urine: Secondary | ICD-10-CM | POA: Insufficient documentation

## 2019-02-15 DIAGNOSIS — E860 Dehydration: Secondary | ICD-10-CM | POA: Diagnosis present

## 2019-02-15 DIAGNOSIS — M48061 Spinal stenosis, lumbar region without neurogenic claudication: Secondary | ICD-10-CM | POA: Diagnosis present

## 2019-02-15 DIAGNOSIS — Z1159 Encounter for screening for other viral diseases: Secondary | ICD-10-CM | POA: Diagnosis not present

## 2019-02-15 DIAGNOSIS — F17210 Nicotine dependence, cigarettes, uncomplicated: Secondary | ICD-10-CM | POA: Diagnosis present

## 2019-02-15 DIAGNOSIS — E739 Lactose intolerance, unspecified: Secondary | ICD-10-CM | POA: Diagnosis present

## 2019-02-15 DIAGNOSIS — M5417 Radiculopathy, lumbosacral region: Secondary | ICD-10-CM | POA: Diagnosis not present

## 2019-02-15 DIAGNOSIS — R339 Retention of urine, unspecified: Secondary | ICD-10-CM | POA: Diagnosis present

## 2019-02-15 DIAGNOSIS — M5117 Intervertebral disc disorders with radiculopathy, lumbosacral region: Secondary | ICD-10-CM | POA: Diagnosis present

## 2019-02-15 DIAGNOSIS — G834 Cauda equina syndrome: Secondary | ICD-10-CM | POA: Diagnosis present

## 2019-02-15 DIAGNOSIS — J449 Chronic obstructive pulmonary disease, unspecified: Secondary | ICD-10-CM | POA: Diagnosis present

## 2019-02-15 DIAGNOSIS — I5032 Chronic diastolic (congestive) heart failure: Secondary | ICD-10-CM | POA: Diagnosis present

## 2019-02-15 DIAGNOSIS — D638 Anemia in other chronic diseases classified elsewhere: Secondary | ICD-10-CM | POA: Diagnosis present

## 2019-02-15 DIAGNOSIS — M069 Rheumatoid arthritis, unspecified: Secondary | ICD-10-CM | POA: Diagnosis present

## 2019-02-15 DIAGNOSIS — M4807 Spinal stenosis, lumbosacral region: Secondary | ICD-10-CM | POA: Diagnosis present

## 2019-02-15 DIAGNOSIS — Z681 Body mass index (BMI) 19 or less, adult: Secondary | ICD-10-CM | POA: Diagnosis not present

## 2019-02-15 DIAGNOSIS — R330 Drug induced retention of urine: Secondary | ICD-10-CM | POA: Diagnosis present

## 2019-02-15 DIAGNOSIS — F329 Major depressive disorder, single episode, unspecified: Secondary | ICD-10-CM | POA: Diagnosis present

## 2019-02-15 DIAGNOSIS — I110 Hypertensive heart disease with heart failure: Secondary | ICD-10-CM | POA: Diagnosis present

## 2019-02-15 DIAGNOSIS — F419 Anxiety disorder, unspecified: Secondary | ICD-10-CM | POA: Diagnosis present

## 2019-02-15 DIAGNOSIS — R200 Anesthesia of skin: Secondary | ICD-10-CM | POA: Diagnosis present

## 2019-02-15 LAB — FERRITIN: Ferritin: 172 ng/mL (ref 11–307)

## 2019-02-15 LAB — CBC
HCT: 21.2 % — ABNORMAL LOW (ref 36.0–46.0)
HCT: 22.2 % — ABNORMAL LOW (ref 36.0–46.0)
Hemoglobin: 7.6 g/dL — ABNORMAL LOW (ref 12.0–15.0)
Hemoglobin: 7.9 g/dL — ABNORMAL LOW (ref 12.0–15.0)
MCH: 35.8 pg — ABNORMAL HIGH (ref 26.0–34.0)
MCH: 35.4 pg — ABNORMAL HIGH (ref 26.0–34.0)
MCHC: 35.8 g/dL (ref 30.0–36.0)
MCHC: 35.6 g/dL (ref 30.0–36.0)
MCV: 100 fL (ref 80.0–100.0)
MCV: 99.6 fL (ref 80.0–100.0)
Platelets: 191 10*3/uL (ref 150–400)
Platelets: 190 10*3/uL (ref 150–400)
RBC: 2.12 MIL/uL — ABNORMAL LOW (ref 3.87–5.11)
RBC: 2.23 MIL/uL — ABNORMAL LOW (ref 3.87–5.11)
RDW: 14.1 % (ref 11.5–15.5)
RDW: 14.1 % (ref 11.5–15.5)
WBC: 6.6 10*3/uL (ref 4.0–10.5)
WBC: 5.8 10*3/uL (ref 4.0–10.5)
nRBC: 0 % (ref 0.0–0.2)
nRBC: 0 % (ref 0.0–0.2)

## 2019-02-15 LAB — BASIC METABOLIC PANEL
Anion gap: 5 (ref 5–15)
Anion gap: 9 (ref 5–15)
BUN: 14 mg/dL (ref 8–23)
BUN: 12 mg/dL (ref 8–23)
CO2: 21 mmol/L — ABNORMAL LOW (ref 22–32)
CO2: 19 mmol/L — ABNORMAL LOW (ref 22–32)
Calcium: 8 mg/dL — ABNORMAL LOW (ref 8.9–10.3)
Calcium: 7.9 mg/dL — ABNORMAL LOW (ref 8.9–10.3)
Chloride: 99 mmol/L (ref 98–111)
Chloride: 97 mmol/L — ABNORMAL LOW (ref 98–111)
Creatinine, Ser: 1.05 mg/dL — ABNORMAL HIGH (ref 0.44–1.00)
Creatinine, Ser: 1.07 mg/dL — ABNORMAL HIGH (ref 0.44–1.00)
GFR calc Af Amer: >60 mL/min (ref >60)
GFR calc Af Amer: 60 mL/min — ABNORMAL LOW (ref >60)
GFR calc non Af Amer: 53 mL/min — ABNORMAL LOW (ref >60)
GFR calc non Af Amer: 51 mL/min — ABNORMAL LOW (ref >60)
Glucose, Bld: 95 mg/dL (ref 70–99)
Glucose, Bld: 90 mg/dL (ref 70–99)
Potassium: 4 mmol/L (ref 3.5–5.1)
Potassium: 3.8 mmol/L (ref 3.5–5.1)
Sodium: 125 mmol/L — ABNORMAL LOW (ref 135–145)
Sodium: 125 mmol/L — ABNORMAL LOW (ref 135–145)

## 2019-02-15 LAB — IRON AND TIBC
Iron: 44 ug/dL (ref 28–170)
Saturation Ratios: 16 % (ref 10.4–31.8)
TIBC: 272 ug/dL (ref 250–450)
UIBC: 228 ug/dL

## 2019-02-15 LAB — VITAMIN B12: Vitamin B-12: 1298 pg/mL — ABNORMAL HIGH (ref 180–914)

## 2019-02-15 LAB — FOLATE: Folate: 34.6 ng/mL (ref >5.9)

## 2019-02-15 LAB — RETICULOCYTES
Immature Retic Fract: 3.1 % (ref 2.3–15.9)
RBC.: 2.12 MIL/uL — ABNORMAL LOW (ref 3.87–5.11)
Retic Count, Absolute: 88.4 10*3/uL (ref 19.0–186.0)
Retic Ct Pct: 4.2 % — ABNORMAL HIGH (ref 0.4–3.1)

## 2019-02-15 LAB — SODIUM, URINE, RANDOM: Sodium, Ur: 45 mmol/L

## 2019-02-15 LAB — SARS CORONAVIRUS 2: SARS Coronavirus 2: NOT DETECTED

## 2019-02-15 LAB — OSMOLALITY, URINE: Osmolality, Ur: 146 mOsm/kg — ABNORMAL LOW (ref 300–900)

## 2019-02-15 LAB — OSMOLALITY: Osmolality: 258 mOsm/kg — ABNORMAL LOW (ref 275–295)

## 2019-02-15 MED ORDER — PREDNISONE 5 MG PO TABS: 10.0000 mg | ORAL_TABLET | Freq: Every day | ORAL | Status: DC

## 2019-02-15 MED ORDER — HYDRALAZINE HCL 20 MG/ML IJ SOLN: 5.0000 mg | INTRAMUSCULAR | Status: DC | PRN

## 2019-02-15 MED ORDER — PREDNISONE 20 MG PO TABS
20.0000 mg | ORAL_TABLET | Freq: Two times a day (BID) | ORAL | Status: DC
  Administered 2019-02-16 – 2019-02-21 (×11): 20 mg via ORAL
  Filled 2019-02-15 (×11): qty 1

## 2019-02-15 MED ORDER — HYDRALAZINE HCL 20 MG/ML IJ SOLN
5.0000 mg | INTRAMUSCULAR | Status: DC | PRN
  Administered 2019-02-15: 5 mg via INTRAVENOUS
  Filled 2019-02-15: qty 1

## 2019-02-15 MED ORDER — SPIRONOLACTONE 12.5 MG HALF TABLET
12.5000 mg | ORAL_TABLET | Freq: Every day | ORAL | Status: DC
  Administered 2019-02-16 – 2019-02-20 (×5): 12.5 mg via ORAL
  Filled 2019-02-15 (×8): qty 1

## 2019-02-15 MED ORDER — LOSARTAN POTASSIUM 50 MG PO TABS
100.0000 mg | ORAL_TABLET | Freq: Every day | ORAL | Status: DC
  Administered 2019-02-15 – 2019-02-20 (×6): 100 mg via ORAL
  Filled 2019-02-15 (×7): qty 2

## 2019-02-15 MED ORDER — POLYETHYLENE GLYCOL 3350 17 G PO PACK
17.0000 g | PACK | Freq: Every day | ORAL | Status: DC
  Administered 2019-02-15 – 2019-02-16 (×2): 17 g via ORAL
  Filled 2019-02-15 (×2): qty 1

## 2019-02-15 MED ORDER — ALPRAZOLAM 0.5 MG PO TABS
0.5000 mg | ORAL_TABLET | Freq: Two times a day (BID) | ORAL | Status: DC | PRN
  Administered 2019-02-16 – 2019-02-22 (×9): 0.5 mg via ORAL
  Filled 2019-02-15 (×9): qty 1

## 2019-02-15 MED ORDER — ACETAMINOPHEN 325 MG PO TABS
650.0000 mg | ORAL_TABLET | Freq: Four times a day (QID) | ORAL | Status: DC | PRN
  Administered 2019-02-15 – 2019-02-20 (×9): 650 mg via ORAL
  Filled 2019-02-15 (×9): qty 2

## 2019-02-15 MED ORDER — IPRATROPIUM-ALBUTEROL 0.5-2.5 (3) MG/3ML IN SOLN: 3.0000 mL | Freq: Four times a day (QID) | RESPIRATORY_TRACT | Status: DC | PRN

## 2019-02-15 MED ORDER — FLEET ENEMA 7-19 GM/118ML RE ENEM
1.0000 | ENEMA | Freq: Once | RECTAL | Status: AC
  Administered 2019-02-15: 1 via RECTAL
  Filled 2019-02-15: qty 1

## 2019-02-15 MED ORDER — ALENDRONATE SODIUM 10 MG PO TABS: 70.0000 mg | ORAL_TABLET | Freq: Every day | ORAL | Status: DC

## 2019-02-15 MED ORDER — SENNA 8.6 MG PO TABS: 1.0000 | ORAL_TABLET | Freq: Every day | ORAL | Status: DC

## 2019-02-15 MED ORDER — ACETAMINOPHEN 650 MG RE SUPP: 650.0000 mg | Freq: Four times a day (QID) | RECTAL | Status: DC | PRN

## 2019-02-15 MED ORDER — BUSPIRONE HCL 5 MG PO TABS
30.0000 mg | ORAL_TABLET | Freq: Two times a day (BID) | ORAL | Status: DC
  Administered 2019-02-15 – 2019-02-22 (×15): 30 mg via ORAL
  Filled 2019-02-15 (×15): qty 6

## 2019-02-15 MED ORDER — TRAMADOL HCL 50 MG PO TABS
50.0000 mg | ORAL_TABLET | Freq: Four times a day (QID) | ORAL | Status: DC | PRN
  Administered 2019-02-15 – 2019-02-22 (×25): 50 mg via ORAL
  Filled 2019-02-15 (×25): qty 1

## 2019-02-15 MED ORDER — SENNOSIDES-DOCUSATE SODIUM 8.6-50 MG PO TABS
1.0000 | ORAL_TABLET | Freq: Two times a day (BID) | ORAL | Status: DC
  Administered 2019-02-15 – 2019-02-22 (×13): 1 via ORAL
  Filled 2019-02-15 (×15): qty 1

## 2019-02-15 MED ORDER — BOOST / RESOURCE BREEZE PO LIQD CUSTOM
1.0000 | Freq: Three times a day (TID) | ORAL | Status: DC
  Administered 2019-02-16 – 2019-02-22 (×8): 1 via ORAL

## 2019-02-15 MED ORDER — HYDRALAZINE HCL 50 MG PO TABS
50.0000 mg | ORAL_TABLET | Freq: Three times a day (TID) | ORAL | Status: DC
  Administered 2019-02-15 – 2019-02-21 (×17): 50 mg via ORAL
  Filled 2019-02-15 (×18): qty 1

## 2019-02-15 MED ORDER — SODIUM CHLORIDE 0.9 % IV SOLN
INTRAVENOUS | Status: AC
  Administered 2019-02-15 (×2): via INTRAVENOUS

## 2019-02-15 MED ORDER — DILTIAZEM HCL ER COATED BEADS 360 MG PO CP24
360.0000 mg | ORAL_CAPSULE | Freq: Every day | ORAL | Status: DC
  Administered 2019-02-15 – 2019-02-22 (×8): 360 mg via ORAL
  Filled 2019-02-15 (×8): qty 1

## 2019-02-15 MED ORDER — SODIUM CHLORIDE 0.9 % IV SOLN
INTRAVENOUS | Status: AC
  Administered 2019-02-15: 03:00:00 via INTRAVENOUS

## 2019-02-15 MED ORDER — HYDRALAZINE HCL 20 MG/ML IJ SOLN
5.0000 mg | Freq: Once | INTRAMUSCULAR | Status: DC
  Filled 2019-02-15: qty 1

## 2019-02-15 MED ORDER — PREDNISONE 10 MG PO TABS
10.0000 mg | ORAL_TABLET | Freq: Every day | ORAL | Status: DC
  Administered 2019-02-15: 10 mg via ORAL
  Filled 2019-02-15: qty 1
  Filled 2019-02-15: qty 2
  Filled 2019-02-15: qty 1

## 2019-02-15 MED ORDER — ENOXAPARIN SODIUM 30 MG/0.3ML ~~LOC~~ SOLN: 30.0000 mg | SUBCUTANEOUS | Status: DC

## 2019-02-15 NOTE — TOC Initial Note (Signed)
Transition of Care [facility]) - Initial/Assessment Note    Patient Details  Name: Regina Ortiz MRN: [id number] Date of Birth: 09/07/1945  [facility]) CM/SW Contact:    Alexander Mt, [place] Phone Number: 02/15/2019, 4:03 PM  Clinical Narrative:                 CSW spoke with pt via telephone, familiar with pt from prior admission. Pt was at [facility] for 8 days, she did not have a good experience while there and feels she wanted to return home instead. Pt son Shanon Brow had arranged her to have a home health aide Anne Ng who is a CNA and can provide needed assistance. Pt requested Shanon Brow be called for additional questions.   Shanon Brow was contacted at [phone]. He confirms that pt plan is to return home with assistance from [facility], he is also interested in [facility] therapies if possible. He would like PT to call him and discuss precautions and recommendations when session complete.   CSW will follow for recommendations and arrange [facility] services as needed.   Expected Discharge Plan: [place] Barriers to Discharge: Continued Medical Work up   Patient Goals and CMS Choice Patient states their goals for this hospitalization and ongoing recovery are:: to feel better and go home   Choice offered to / list presented to : Patient, Adult Children  Expected Discharge Plan and Services Expected Discharge Plan: [place] In-house Referral: Clinical Social Work Discharge Planning Services: CM Consult Post Acute Care Choice: NA Living arrangements for the past 2 months: [place], [place] Expected Discharge Date: 02/17/19               HH Arranged: OT, PT, RN  Prior Living Arrangements/Services Living arrangements for the past 2 months: [place], [place] Lives with:: Self Patient language and need for interpreter reviewed:: Yes(no needs) Do you feel safe going back to the place where you live?: Yes       Need for Family Participation in Patient Care: Yes (Comment)(assistance with ADLs; supervision) Care giver support system in place?: Yes (comment)(adult son; home aide) Current home services: DME, Homehealth aide    Activities of Daily Living Home Assistive Devices/Equipment: None ADL Screening (condition at time of admission) Patient's cognitive ability adequate to safely complete daily activities?: Yes Is the patient deaf or have difficulty hearing?: No Does the patient have difficulty seeing, even when wearing glasses/contacts?: No Does the patient have difficulty concentrating, remembering, or making decisions?: No Patient able to express need for assistance with ADLs?: Yes Does the patient have difficulty dressing or bathing?: Yes Independently performs ADLs?: Yes (appropriate for developmental age) Communication: Independent Dressing (OT): Independent Walks in Home: Independent Does the patient have difficulty walking or climbing stairs?: Yes Weakness of Legs: None Weakness of Arms/Hands: None  Permission Sought/Granted Permission sought to share information with : Facility Sport and exercise psychologist, Family Supports Permission granted to share information with : Yes, Verbal Permission Granted  Share Information with NAME: Regina Ortiz  Permission granted to share info w AGENCY: [place] granted to share info w Relationship: son  Permission granted to share info w Contact Information: [phone]  Emotional Assessment Appearance:: Appears stated age Attitude/Demeanor/Rapport: Engaged, Gracious Affect (typically observed): Other (comment)(spoke with pt on phone) Orientation: : Oriented to Self, Oriented to Place, Oriented to  Time, Oriented to Situation Alcohol / Substance Use: Tobacco Use Psych Involvement: No (comment)  Admission diagnosis:  Urinary retention [R33.9] Patient Active Problem List   Diagnosis Date Noted  . Urinary retention 02/15/2019   . Lumbosacral radiculopathy 02/15/2019  . Constipation 02/15/2019  . Acute urinary retention 02/15/2019  . Cauda equina syndrome ([place]) 01/28/2019  . Chest pain 09/25/2017  . Hypertensive urgency 09/24/2017  . Headache 09/24/2017  . Alcohol use 09/24/2017  . AKI (acute kidney injury) ([place]) 09/24/2017  . Bilateral carotid artery stenosis   . Vulvovaginal candidiasis 10/22/2015  . Mild malnutrition ([place]) 07/07/2013  . Chronic bronchitis ([place]) 07/07/2013  . Tobacco abuse 07/07/2013  . Hypertension   . Short of breath on exertion   . Hemorrhoid   . LBBB (left bundle branch block)   . Anxiety   . Depression   . RA (rheumatoid arthritis) ([place])   . Venous insufficiency of leg   . Chronic hyponatremia   . AIN grade I 04/05/2013  . Diarrhea 09/22/2012  . Hyponatremia 09/22/2012  . Hypokalemia 09/22/2012  . ARF (acute renal failure) ([place]) 09/22/2012  . Atypical chest pain 06/22/2012  . Anemia 06/22/2012  . Vulvar intraepithelial neoplasia III (VIN III) 01/29/2012   PCP:  Maurice Small, MD Pharmacy:   [place], [place] - [address] [place] [place] [address] Phone: [phone] Fax: [phone]     Social Determinants of Health (SDOH) Interventions    Readmission Risk Interventions No flowsheet data found.

## 2019-02-15 NOTE — Progress Notes (Signed)
Pt c/o not feeling that she could go home today, she was able to state the plan the NP discussed with her this morning. Encouraged pt to wait for her labs to be drawn and reassessed by NP/MD.    Pt wanting to have MD call her son to discuss plan.

## 2019-02-15 NOTE — Progress Notes (Signed)
Initial Nutrition Assessment  RD working remotely.  DOCUMENTATION CODES:   Underweight  INTERVENTION:  Provide Boost Breeze po TID, each supplement provides 250 kcal and 9 grams of protein.  Encourage adequate PO intake.   NUTRITION DIAGNOSIS:   Increased nutrient needs related to chronic illness(COPD) as evidenced by estimated needs.  GOAL:   Patient will meet greater than or equal to 90% of their needs  MONITOR:   PO intake, Supplement acceptance, Labs, Weight trends, Skin, I & O's  REASON FOR ASSESSMENT:   Malnutrition Screening Tool    ASSESSMENT:   73 y.o. female with medical history significant of herniated disc L4/5 with cauda equina syndrome s/p surgical decompression 01/28/19, chronic hyponatremia, COPD, GERD, hypertension, LBBB, macrocytosis, seronegative rheumatoid arthritis, chronic venous insufficiency presents with urinary retention.   RD contacted pt via inpatient room phone. Pt reports having a decreased appetite due to pain however still tries to consume her food at meals. Pt reports usual intake of at least 2 meals a day. Pt with no weight loss per weight records. RD to order nutritional supplements to aid in caloric and protein needs. Pt reports Ensure causes abdominal discomfort. RD to order [facility].   Unable to complete Nutrition-Focused physical exam at this time.   Labs and medications reviewed.   Diet Order:   Diet Order            Diet Heart Room service appropriate? No; Fluid consistency: Thin  Diet effective now              EDUCATION NEEDS:   Not appropriate for education at this time  Skin:  Skin Assessment: Skin Integrity Issues: Skin Integrity Issues:: Stage I Stage I: coccyx  Last BM:  6/15  Height:   Ht Readings from Last 1 Encounters:  02/14/19 5\' 4"  (1.626 m)    Weight:   Wt Readings from Last 1 Encounters:  02/14/19 47.6 kg    Ideal Body Weight:  54.5 kg  BMI:  Body mass index is 18.02  kg/m.  Estimated Nutritional Needs:   Kcal:  1500-1700  Protein:  70-80 grams  Fluid:  >/= 1.5 L/day    Corrin Parker, MS, RD, LDN Pager # [phone] After hours/ weekend pager # [phone]

## 2019-02-15 NOTE — Progress Notes (Addendum)
Paged triad about patient's elevated b/p of 173/67. Last dose taken 5pm yesterday. Awaiting prn b/p meds and orders. Has normal saline infusing at 125 at this time.   New orders noted. One time dose of hydralazine ordered.   0403-B/p rechecked prior to administration of hydralazine IV, 156/64. Hydralazine held at this time.

## 2019-02-15 NOTE — Consult Note (Signed)
Reason for Consult: Clinical deterioration with urinary retention Referring Physician: [name] is an 73 y.o. female.  HPI: Patient is a 73 year old individual who had surgery on 29 May to decompress a large centrally herniated disc at L4-L5 this required surgical stabilization with pedicle screw fixation.  She is had premorbid difficulty with urination and significant constipation in the past.  At the time of her discharge it appeared that some of her voiding issues had improved however in the last several days she feels that things have worsened and deteriorated and she was brought to the hospital with urinary retention.  Work-up demonstrated a recent MRI which demonstrates that she has some evidence of persistent stenosis at L4-5 and also perhaps some progression at L3-4 where she has had moderate degree of central canal stenosis.  She is seen now for further evaluation of this problem and review of her MRI has been performed.  On her exam I note that the strength appears to be about what it had been at the time of discharge with some weakness left greater than right graded at 4 out of 5 in the iliopsoas quadricep tibialis anterior and gastrocs.  Past Medical History:  Diagnosis Date  . AIN grade I   . Anxiety   . Bruises easily   . Chronic diarrhea   . Chronic hyponatremia   . Colitis   . COPD (chronic obstructive pulmonary disease) ([place])   . Depression   . GERD (gastroesophageal reflux disease)   . History of chronic bronchitis   . History of vulvar dysplasia    serveral recurrency's   . Hypertension   . LBBB (left bundle branch block) W/ PROLONGED PR   CARDIOLOGSIT-  DR Johnsie Cancel-   LOV IN EPIC  . Macrocytosis   . Nocturia   . Osteoporosis   . Seronegative rheumatoid arthritis ([place])   . Short of breath on exertion   . Smokers' cough ([place])   . Venous insufficiency of leg    , Edema  . VIN III (vulvar intraepithelial neoplasia III)    RECURRENT -    Past  Surgical History:  Procedure Laterality Date  . APPENDECTOMY  1992  . CARDIOVASCULAR STRESS TEST  06-23-2012  DR Jenkins Rouge   NORMAL LEXISCAN MYOVIEW/ EF 83%/  NO ISCHEMIA  . CO2 LASER APPLICATION  41/63/8453   Procedure: CO2 LASER APPLICATION;  Surgeon: Janie Morning, MD PHD;  Location: [facility];  Service: Gynecology;  Laterality: N/A;  Laser Vaporization  . CO2 LASER APPLICATION N/A 02/02/6802   Procedure: CO2 LASER APPLICATION;  Surgeon: [name], MD;  Location: [facility];  Service: Gynecology;  Laterality: N/A;  . CO2 LASER APPLICATION N/A 10/13/2480   Procedure: CO2 LASER APPLICATION of the vulva;  Surgeon: Janie Morning, MD;  Location: [facility];  Service: Gynecology;  Laterality: N/A;  . EXCISION VULVAR LESIONS  07/2012  . HEMORRHOID SURGERY N/A 04/15/2013   Procedure: Excision of external and internal  hemorrhoid with AIN, Exam under anesthesia;  Surgeon: Odis Hollingshead, MD;  Location: [facility];  Service: General;  Laterality: N/A;  . REPAIR FISTULA IN ANO/  I & D PERIRECTAL ABSCESS  10-26-2002  DR Johnathan Hausen  . TRANSTHORACIC ECHOCARDIOGRAM  06-23-2012   NORMAL LVSF/ EF 55-60%  . VAGINAL HYSTERECTOMY  1997   partial  . VULVAR LESION REMOVAL N/A 11/03/2014   Procedure: CO2 LASER OF VULVAR ;  Surgeon: [name], MD;  Location:  [facility];  Service: Gynecology;  Laterality: N/A;  . VULVECTOMY N/A 03/26/2015   Procedure: WIDE LOCA  EXCISION OF VULVAR;  Surgeon: [name], MD;  Location: [facility];  Service: Gynecology;  Laterality: N/A;  . Wide Local Excision of labia majora  04-04-2010   Right-sided lesion, CO2 ablation of right labia minora    Family History  Problem Relation Age of Onset  . Diabetes Mother   . Hypertension Father   . Lymphoma Son   . Cancer Son        lymphoblastic lymphoma    Social History:  reports that she has been smoking cigarettes. She has a 12.50 pack-year  smoking history. She has never used smokeless tobacco. She reports current alcohol use of about 2.0 standard drinks of alcohol per week. She reports that she does not use drugs.  Allergies:  Allergies  Allergen Reactions  . Latex Swelling and Other (See Comments)     fever blisters  . Augmentin [Amoxicillin-Pot Clavulanate] Nausea And Vomiting  . Doxycycline Other (See Comments)    Blurred vision, nervous, unsteady  . Lactose Intolerance (Gi) Other (See Comments)    Unknown, per pt  . Lialda [Mesalamine] Diarrhea  . Prinivil [Lisinopril] Diarrhea  . Triamcinolone Acetonide Other (See Comments)    REDNESS AND PAIN  . Amlodipine Rash  . Lotensin [Benazepril Hcl] Rash    Medications: I have reviewed the patient's current medications.  Results for orders placed or performed during the hospital encounter of 02/14/19 (from the past 48 hour(s))  CBC with Differential     Status: Abnormal   Collection Time: 02/14/19  8:59 PM  Result Value Ref Range   WBC 9.8 4.0 - 10.5 K/uL   RBC 2.39 (L) 3.87 - 5.11 MIL/uL   Hemoglobin 8.6 (L) 12.0 - 15.0 g/dL   HCT 24.2 (L) 36.0 - 46.0 %   MCV 101.3 (H) 80.0 - 100.0 fL   MCH 36.0 (H) 26.0 - 34.0 pg   MCHC 35.5 30.0 - 36.0 g/dL   RDW 14.4 11.5 - 15.5 %   Platelets 221 150 - 400 K/uL   nRBC 0.0 0.0 - 0.2 %   Neutrophils Relative % 87 %   Neutro Abs 8.5 (H) 1.7 - 7.7 K/uL   Lymphocytes Relative 7 %   Lymphs Abs 0.7 0.7 - 4.0 K/uL   Monocytes Relative 5 %   Monocytes Absolute 0.5 0.1 - 1.0 K/uL   Eosinophils Relative 0 %   Eosinophils Absolute 0.0 0.0 - 0.5 K/uL   Basophils Relative 0 %   Basophils Absolute 0.0 0.0 - 0.1 K/uL   Immature Granulocytes 1 %   Abs Immature Granulocytes 0.06 0.00 - 0.07 K/uL    Comment: Performed at [facility], [address], [place], [place] [address]  Basic metabolic panel     Status: Abnormal   Collection Time: 02/14/19  8:59 PM  Result Value Ref Range   Sodium 118 (LL) 135 - 145 mmol/L    Comment:  CRITICAL RESULT CALLED TO, READ BACK BY AND VERIFIED WITH: BLACKWELL,S RN 02/14/2019 2157 JORDANS    Potassium 4.2 3.5 - 5.1 mmol/L   Chloride 88 (L) 98 - 111 mmol/L   CO2 21 (L) 22 - 32 mmol/L   Glucose, Bld 113 (H) 70 - 99 mg/dL   BUN 21 8 - 23 mg/dL   Creatinine, Ser 1.35 (H) 0.44 - 1.00 mg/dL   Calcium 8.9 8.9 - 10.3 mg/dL  GFR calc non Af Amer 39 (L) >60 mL/min   GFR calc Af Amer 45 (L) >60 mL/min   Anion gap 9 5 - 15    Comment: Performed at [facility] [address], [place], [place] [address]  Urinalysis, Routine w reflex microscopic     Status: Abnormal   Collection Time: 02/14/19  9:41 PM  Result Value Ref Range   Color, Urine YELLOW YELLOW   APPearance CLEAR CLEAR   Specific Gravity, Urine 1.008 1.005 - 1.030   pH 7.0 5.0 - 8.0   Glucose, UA NEGATIVE NEGATIVE mg/dL   Hgb urine dipstick NEGATIVE NEGATIVE   Bilirubin Urine NEGATIVE NEGATIVE   Ketones, ur NEGATIVE NEGATIVE mg/dL   Protein, ur 30 (A) NEGATIVE mg/dL   Nitrite NEGATIVE NEGATIVE   Leukocytes,Ua NEGATIVE NEGATIVE   RBC / HPF 0-5 0 - 5 RBC/hpf   WBC, UA 0-5 0 - 5 WBC/hpf   Bacteria, UA NONE SEEN NONE SEEN    Comment: Performed at [facility] [address] [place], [place] [address]  SARS Coronavirus 2     Status: None   Collection Time: 02/15/19 12:24 AM  Result Value Ref Range   SARS Coronavirus 2 NOT DETECTED NOT DETECTED    Comment: (NOTE) SARS-CoV-2 target nucleic acids are NOT DETECTED. The SARS-CoV-2 RNA is generally detectable in upper and lower respiratory specimens during the acute phase of infection.  Negative  results do not preclude SARS-CoV-2 infection, do not rule out co-infections with other pathogens, and should not be used as the sole basis for treatment or other patient management decisions.  Negative results must be combined with clinical observations, patient history, and epidemiological information. The expected result is Not Detected. Fact Sheet for  Patients: http://www.biofiredefense.com/wp-content/uploads/2020/03/BIOFIRE-COVID -19-patients.pdf Fact Sheet for Healthcare Providers: http://www.biofiredefense.com/wp-content/uploads/2020/03/BIOFIRE-COVID -19-hcp.pdf This test is not yet approved or cleared by the [place] and  has been authorized for detection and/or diagnosis of SARS-CoV-2 by FDA under an Emergency Use Authorization (EUA).  This EUA will remain in effec t (meaning this test can be used) for the duration of  the COVID-19 declaration under Section 564(b)(1) of the Act, 21 U.S.C. section 360bbb-3(b)(1), unless the authorization is terminated or revoked sooner. Performed at [facility], [place] [address], [place], [place] [address]   Osmolality, urine     Status: Abnormal   Collection Time: 02/15/19  1:14 AM  Result Value Ref Range   Osmolality, Ur 146 (L) 300 - 900 mOsm/kg    Comment: Performed at [facility] [address], [place], [place] [address]  Sodium, urine, random     Status: None   Collection Time: 02/15/19  1:14 AM  Result Value Ref Range   Sodium, Ur 45 mmol/L    Comment: Performed at [facility] [address] [place], [place] [address]  Vitamin B12     Status: Abnormal   Collection Time: 02/15/19  6:09 AM  Result Value Ref Range   Vitamin B-12 1,298 (H) 180 - 914 pg/mL    Comment: (NOTE) This assay is not validated for testing neonatal or myeloproliferative syndrome specimens for Vitamin B12 levels. Performed at [facility], [place] [address], [place], [place] [address]   Folate     Status: None   Collection Time: 02/15/19  6:09 AM  Result Value Ref Range   Folate 34.6 >5.9 ng/mL    Comment: Performed at [facility]  [address], [place], [place] [address]  Iron and TIBC     Status: None   Collection Time: 02/15/19  6:09 AM  Result Value Ref Range   Iron 44 28 - 170 ug/dL   TIBC 272 250 - 450 ug/dL   Saturation Ratios 16 10.4 - 31.8 %   UIBC  228 ug/dL    Comment: Performed at [facility], [place] [address], [place], [place] [address]  Ferritin     Status: None   Collection Time: 02/15/19  6:09 AM  Result Value Ref Range   Ferritin 172 11 - 307 ng/mL    Comment: Performed at [facility], [place] [address], [place], [place] [address]  Reticulocytes     Status: Abnormal   Collection Time: 02/15/19  6:09 AM  Result Value Ref Range   Retic Ct Pct 4.2 (H) 0.4 - 3.1 %   RBC. 2.12 (L) 3.87 - 5.11 MIL/uL   Retic Count, Absolute 88.4 19.0 - 186.0 K/uL   Immature Retic Fract 3.1 2.3 - 15.9 %    Comment: Performed at [facility] [address], [place], [place] [address]  CBC     Status: Abnormal   Collection Time: 02/15/19  6:09 AM  Result Value Ref Range   WBC 6.6 4.0 - 10.5 K/uL   RBC 2.12 (L) 3.87 - 5.11 MIL/uL   Hemoglobin 7.6 (L) 12.0 - 15.0 g/dL   HCT 21.2 (L) 36.0 - 46.0 %   MCV 100.0 80.0 - 100.0 fL   MCH 35.8 (H) 26.0 - 34.0 pg   MCHC 35.8 30.0 - 36.0 g/dL   RDW 14.1 11.5 - 15.5 %   Platelets 191 150 - 400 K/uL   nRBC 0.0 0.0 - 0.2 %    Comment: Performed at [facility], [place] [address], [place], [place] [address]  Basic metabolic panel     Status: Abnormal   Collection Time: 02/15/19  6:09 AM  Result Value Ref Range   Sodium 125 (L) 135 - 145 mmol/L    Comment: DELTA CHECK NOTED   Potassium 4.0 3.5 - 5.1 mmol/L   Chloride 99 98 - 111 mmol/L   CO2 21 (L) 22 - 32 mmol/L   Glucose, Bld 95 70 - 99 mg/dL   BUN 14 8 - 23 mg/dL   Creatinine, Ser 1.05 (H) 0.44 - 1.00 mg/dL   Calcium 8.0 (L) 8.9 - 10.3 mg/dL   GFR calc non Af Amer 53 (L) >60 mL/min   GFR calc Af Amer >60 >60 mL/min   Anion gap 5 5 - 15    Comment: Performed at [facility], [place] [address] [place], [place] [address]  Osmolality     Status: Abnormal   Collection Time: 02/15/19  6:20 AM  Result Value Ref Range   Osmolality 258 (L) 275 - 295 mOsm/kg    Comment: Performed at [facility], [place] [address],  [place], [place] [address]  Basic metabolic panel     Status: Abnormal   Collection Time: 02/15/19 11:43 AM  Result Value Ref Range   Sodium 125 (L) 135 - 145 mmol/L   Potassium 3.8 3.5 - 5.1 mmol/L   Chloride 97 (L) 98 - 111 mmol/L   CO2 19 (L) 22 - 32 mmol/L   Glucose, Bld 90 70 - 99 mg/dL   BUN 12 8 - 23 mg/dL   Creatinine, Ser 1.07 (H) 0.44 - 1.00 mg/dL  Calcium 7.9 (L) 8.9 - 10.3 mg/dL   GFR calc non Af Amer 51 (L) >60 mL/min   GFR calc Af Amer 60 (L) >60 mL/min   Anion gap 9 5 - 15    Comment: Performed at [facility] [address], [place], [place] [address]  CBC     Status: Abnormal   Collection Time: 02/15/19 11:43 AM  Result Value Ref Range   WBC 5.8 4.0 - 10.5 K/uL   RBC 2.23 (L) 3.87 - 5.11 MIL/uL   Hemoglobin 7.9 (L) 12.0 - 15.0 g/dL   HCT 22.2 (L) 36.0 - 46.0 %   MCV 99.6 80.0 - 100.0 fL   MCH 35.4 (H) 26.0 - 34.0 pg   MCHC 35.6 30.0 - 36.0 g/dL   RDW 14.1 11.5 - 15.5 %   Platelets 190 150 - 400 K/uL   nRBC 0.0 0.0 - 0.2 %    Comment: Performed at [facility], [place] [address], [place], [place] [address]    Ct Abdomen Pelvis Wo Contrast  Result Date: 02/15/2019 CLINICAL DATA:  Constipation, urinary retention. Recent back surgery. EXAM: CT ABDOMEN AND PELVIS WITHOUT CONTRAST TECHNIQUE: Multidetector CT imaging of the abdomen and pelvis was performed following the standard protocol without IV contrast. COMPARISON:  None. FINDINGS: Lower chest: Lung bases are clear. No effusions. Heart is normal size. Hepatobiliary: No focal hepatic abnormality. Gallbladder unremarkable. Pancreas: No focal abnormality or ductal dilatation. Spleen: No focal abnormality.  Normal size. Adrenals/Urinary Tract: Perinephric stranding bilaterally. No hydronephrosis. Adrenal glands unremarkable. Foley catheter within the bladder which is decompressed. Stomach/Bowel: Large stool burden in the colon. Stomach, large and small bowel grossly unremarkable. Vascular/Lymphatic: Diffuse aortic  atherosclerosis. No evidence of aneurysm or adenopathy. Reproductive: Prior hysterectomy.  No adnexal masses. Other: No free fluid or free air. Musculoskeletal: Postoperative changes in the lumbar spine. No acute bony abnormality. IMPRESSION: Mild perinephric stranding bilaterally. No hydronephrosis. Foley catheter within the bladder which is decompressed. Large stool burden in the colon suggesting constipation. No evidence of bowel obstruction. Diffuse aortic atherosclerosis. Electronically Signed   By: Rolm Baptise M.D.   On: 02/15/2019 02:48   Mr Lumbar Spine Wo Contrast  Result Date: 02/15/2019 CLINICAL DATA:  73 y/o F; history of L4-5 herniated disc postsurgical decompression on 05/29 presenting with difficulty urinating. EXAM: MRI LUMBAR SPINE WITHOUT CONTRAST TECHNIQUE: Multiplanar, multisequence MR imaging of the lumbar spine was performed. No intravenous contrast was administered. COMPARISON:  01/28/2019 lumbar spine MRI. FINDINGS: Segmentation:  Standard. Alignment:  Physiologic. Vertebrae: L4-5 posterior instrumented fusion and bilateral laminectomy as well as left subarticular discectomy. There is a small fluid collection within the bilateral laminectomy beds extending into the left epidural space to the disc without significant surrounding bone marrow edema. No findings of acute fracture or discitis. Conus medullaris and cauda equina: Conus extends to the L1 level. Conus and cauda equina appear normal. Paraspinal and other soft tissues: There is edema within the subcutaneous fat and paraspinal muscles as well as well as multiple small multiloculated fluid collections along the posterior aspect of spinous processes extending from L3-L5. Left kidney cyst. Disc levels: T12-L1: No significant disc displacement, foraminal stenosis, or canal stenosis. L1-2: No significant disc displacement, foraminal stenosis, or canal stenosis. L2-3: Mild disc bulge and facet hypertrophy. No significant foraminal or  spinal canal stenosis. L3-4: Progressed moderate disc bulge eccentric to the left with facet and ligamentum flavum hypertrophy. Moderate spinal canal stenosis. Partial effacement of left-greater-than-right lateral recesses. Mild-to-moderate  left neural foraminal stenosis. L4-5: Status post laminectomy, discectomy, and posterior fusion. Residual/recurrent central disc extrusion with superior migration to L4 infra pedicular level results in moderate to severe spinal canal stenosis. Additionally, residual disc bulge, endplate marginal osteophytes, and facet hypertrophy moderately narrow the neural foramen bilaterally. L5-S1: Stable disc bulge, endplate marginal osteophytes eccentric to the right neural foramen, as well as facet hypertrophy. Severe right and mild left neural foraminal stenosis. No significant spinal canal stenosis. Partial effacement of lateral recesses. IMPRESSION: 1. L4-5 laminectomy, left subarticular discectomy, and posterior fusion. There are small fluid collections within the laminectomy beds, left L4-5 epidural space discectomy, and along the posterior aspect of spinous processes from L3-L5. No significant associated bone marrow edema, findings are favored to represent postoperative seroma over infection. 2. Residual/recurrent L4-5 central disc extrusion with superior migration resulting in moderate to severe spinal canal stenosis. 3. Progressed disc bulge at L3-4 level with moderate spinal canal stenosis. 4. Multilevel mild and moderate neural foraminal stenosis. Stable severe right L5-S1 foraminal stenosis. Electronically Signed   By: Kristine Garbe M.D.   On: 02/15/2019 02:41    Review of Systems  Constitutional: Negative.   HENT: Negative.   Eyes: Negative.   Respiratory: Negative.   Gastrointestinal: Negative.   Genitourinary:       Urinary retention  Musculoskeletal: Positive for back pain.  Skin: Negative.   Neurological: Positive for sensory change and weakness.   Endo/Heme/Allergies: Negative.    Blood pressure (!) 153/75, pulse 93, temperature 99.2 F (37.3 C), temperature source Oral, resp. rate 14, height 5\' 4"  (1.626 m), weight 47.6 kg, SpO2 100 %. Physical Exam  Constitutional: She is oriented to person, place, and time. She appears well-developed and well-nourished.  HENT:  Head: Normocephalic and atraumatic.  Eyes: Pupils are equal, round, and reactive to light. Conjunctivae and EOM are normal.  Neck: Normal range of motion. Neck supple.  GI: Soft. Bowel sounds are normal.  Musculoskeletal: Normal range of motion.     Comments: All healed midline incision in the lumbar spine with some ecchymosis superior to the incision.  Tender to palpation about the lower lumbar spine at the lumbosacral junction.  Straight leg raising is negative to 60 degrees bilaterally Patrick's maneuver is negative bilaterally.  Neurological: She is alert and oriented to person, place, and time.  Some deep tendon reflexes in the patella and the Achilles motor strength is 4 out of 5 in the iliopsoas the quadricep tibialis anterior and gastroc.  Sensation appears intact to pin and vibratory sensation distal lower extremities.  Upper extremity reflexes and strength is normal.  Cranial nerve examination is normal.    Assessment/Plan: Lumbar spondylosis and stenosis at L3-4 and L4-5 status post decompression for cauda equina syndrome.  Plan: We will start some low-dose Decadron 2 mg twice daily to see if this does not provide some clinical improvement [place] to postoperative edema in the bed of the surgical site.  Will follow along with you.  Not certain that surgical decompression will further improve her situation at the current time.   Regina Ortiz 02/15/2019, 5:21 PM

## 2019-02-15 NOTE — Progress Notes (Signed)
Patient here via stretcher from ED. Facial mask noted. Patient alert and oriented x 4. Assisted to bed x 1. Ecchymotic skin noted to arms and legs. Also bruising noted to lower back where previous surgery incision is noted. Blanchable redness noted to buttocks. Sacral foam place as CHG bath was given.  Clear yellow urine noted to foley bag.  Answers questions appropriately. Oriented to staff and room. Call light within reach. Glasses placed on bedside table. Reminded patient that she is NPO per MD order. Verbalizes understanding.

## 2019-02-15 NOTE — ED Notes (Signed)
Called pt son, per pt request, gave update. Son will call pt neighbor and let her know she is being admitted

## 2019-02-15 NOTE — Evaluation (Signed)
Physical Therapy Evaluation Patient Details Name: Regina Ortiz MRN: [id number] DOB: [birth year] Today's Date: 02/15/2019   History of Present Illness  73 y.o. female admitted on 02/14/19 for urinary retention, perineal hypoesthesia, lumbosacral radiculopathy, hyponatremia, constipation.  Pt with significant PMH of SOB with exertion, LBBB, HTN, COPD, anxiety, L4/5 decompression and fusion on 01/28/19 with d/c to [facility] SN[facility] x 8 days, then home.    Clinical Impression  Pt was able to mobilize EOB and walk to the bathroom with very minimal assist and RW.  She did not have her lumbar corset with her at the hospital and from the sound of it has not been very compliant with it at home. Dr. Ellene Route came in at the end of our session and re-confirmed that his preference is for her to wear it.  She knew 2/3 back precautions.  She would benefit from further acute therapy and from HHPT follow up at discharge.   PT to follow acutely for deficits listed below.      Follow Up Recommendations Home health PT;Supervision - Intermittent    Equipment Recommendations  None recommended by PT    Recommendations for Other Services   NA    Precautions / Restrictions Precautions Precautions: Back Precaution Booklet Issued: No Precaution Comments: verbally reviewed back precautions.  She got 2/3 (missed lifting) Required Braces or Orthoses: Spinal Brace Spinal Brace: Lumbar corset;Applied in sitting position(pt does not have brace in hospital)      Mobility  Bed Mobility Overal bed mobility: Needs Assistance Bed Mobility: Rolling;Sidelying to Sit;Sit to Sidelying Rolling: Supervision Sidelying to sit: Supervision     Sit to sidelying: Supervision General bed mobility comments: supervision for safety, cues for log roll  Transfers Overall transfer level: Needs assistance Equipment used: Rolling walker (2 wheeled) Transfers: Sit to/from Stand Sit to Stand: Min guard         General transfer  comment: min guard assist for safety  Ambulation/Gait Ambulation/Gait assistance: Min guard Gait Distance (Feet): 15 Feet Assistive device: Rolling walker (2 wheeled) Gait Pattern/deviations: Step-through pattern;Decreased stance time - left;Antalgic Gait velocity: decreased   General Gait Details: Pt with pain in WB on L leg during gait.  Reported it helped to have the RW to lean on while ambulating.       Balance Overall balance assessment: Needs assistance Sitting-balance support: Feet supported Sitting balance-Leahy Scale: Good     Standing balance support: Bilateral upper extremity supported;No upper extremity supported;Single extremity supported Standing balance-Leahy Scale: Fair                               Pertinent Vitals/Pain Pain Assessment: 0-10 Faces Pain Scale: Hurts even more Pain Location: left leg with gait Pain Descriptors / Indicators: Grimacing;Guarding Pain Intervention(s): Limited activity within patient's tolerance;Monitored during session;Repositioned    Home Living Family/patient expects to be discharged to:: Private residence Living Arrangements: Alone Available Help at Discharge: Personal care attendant(has a hired Engineer, production) Type of Home: House Home Access: Stairs to enter Entrance Stairs-Rails: None Technical brewer of Steps: 2 Home Layout: Two level;1/2 bath on main level Home Equipment: Walker - 2 wheels      Prior Function Level of Independence: Independent with assistive device(s)         Comments: sponge bathes, used RW when she got home from SNF, but has not since.     Hand Dominance   Dominant Hand: Right    Extremity/Trunk Assessment  Upper Extremity Assessment Upper Extremity Assessment: Overall WFL for tasks assessed    Lower Extremity Assessment Lower Extremity Assessment: LLE deficits/detail LLE Deficits / Details: left leg with burning pain from hip down to foot.   LLE Sensation: decreased light  touch    Cervical / Trunk Assessment Cervical / Trunk Assessment: Other exceptions Cervical / Trunk Exceptions: post op lumbar fusion  Communication   Communication: No difficulties  Cognition Arousal/Alertness: Awake/alert Behavior During Therapy: WFL for tasks assessed/performed Overall Cognitive Status: Within Functional Limits for tasks assessed                                 General Comments: Not tested, but normal conversation             Assessment/Plan    PT Assessment Patient needs continued PT services  PT Problem List Decreased strength;Decreased activity tolerance;Decreased balance;Decreased mobility;Decreased knowledge of use of DME;Decreased knowledge of precautions;Pain;Impaired sensation       PT Treatment Interventions DME instruction;Gait training;Stair training;Functional mobility training;Therapeutic exercise;Therapeutic activities;Balance training;Neuromuscular re-education;Patient/family education;Modalities    PT Goals (Current goals can be found in the Care Plan section)  Acute Rehab PT Goals Patient Stated Goal: get back to walking, decrease daily pain PT Goal Formulation: With patient Time For Goal Achievement: 03/01/19 Potential to Achieve Goals: Good    Frequency Min 3X/week           AM-PAC PT "6 Clicks" Mobility  Outcome Measure Help needed turning from your back to your side while in a flat bed without using bedrails?: None Help needed moving from lying on your back to sitting on the side of a flat bed without using bedrails?: None Help needed moving to and from a bed to a chair (including a wheelchair)?: None Help needed standing up from a chair using your arms (e.g., wheelchair or bedside chair)?: A Little Help needed to walk in hospital room?: A Little Help needed climbing 3-5 steps with a railing? : A Little 6 Click Score: 21    End of Session   Activity Tolerance: Patient limited by pain Patient left: in  bed;with call bell/phone within reach(in bed so RN can give enema)   PT Visit Diagnosis: Difficulty in walking, not elsewhere classified (R26.2);Pain Pain - Right/Left: Left Pain - part of body: Leg    Time: 0623-7628 PT Time Calculation (min) (ACUTE ONLY): 41 min   Charges:          Wells Guiles B. Dona Klemann, PT, DPT  Acute Rehabilitation [phone] pager [phone]) [phone] office  @ [facility]: [phone]   PT Evaluation $PT Eval Moderate Complexity: 1 Mod PT Treatments $Gait Training: 8-22 mins $Therapeutic Activity: 8-22 mins        02/15/2019, 5:03 PM

## 2019-02-15 NOTE — Progress Notes (Signed)
[facility] PROGRESS NOTE  EDRIE EHRICH WUJ:[id number] DOB: Aug 31, 1946 DOA: 02/14/2019 PCP: Maurice Small, MD  Assessment/Plan: Urinary retention, perineal hypoesthesia, lumbosacral radiculopathy. Per history provided by the patient, this seems to be a problem since before her surgery on May 29 but now getting progressively worse.  Reported having perineal hypoesthesia for the previous few days. S/P emergent surgery 02/02/19. Lumbar MRI showing findings consistent with postop seroma and no significant associated bone marrow edema.  Residual/recurrent L4-5 central disc extrusion with superior migration resulting in moderate to severe spinal canal stenosis.  Progressed disc bulge at L3-4 level with moderate spinal canal stenosis.  Multilevel mild and moderate neural foraminal stenosis.  Stable severe right L5-S1 foraminal stenosis. -await neurosurgery in put -continue foley. -PT evaluation -re-consult CIR  Acute on chronic hyponatremia. Back to baseline this am. Hx chronic. Baseline appears to be 127. This am sodium 125. Serum osmolality 258, urine sodium 45, urine osmolality 146 - continue gentle IV fluid hydration -monitor  Constipation CT showing large stool burden suggesting constipation.  No evidence of bowel obstruction. Was given Miralax and senokot. Dismal results -ss enema -continue senokot -stop miralax  Chronic anemia Hg 7.9. down 12.3 before surgery. RBC 2.12, B12 1298, otherwise anemia panel within limits of normal. No s/sx active bleeding.  -monitor  COPD -Stable.  No shortness of breath or wheezing.  DuoNebs PRN.  Hypertension. Fair control. Home meds include losartan, diltazem and apresoline and aldactone.  -resume home meds -monitor  Code Status: full Family Communication: son on phone Disposition Plan: to be determine   Consultants:  Dr Ellene Route neurosurgery  Procedures:    Antibiotics:    HPI/Subjective: 73 yo s/p decompression and fusion L4-L5  on 01/28/19 with continuing cauda equina syndrome. Discharged 6/4 to facility and then to home. Had difficulty with urine retention that worsened. Admitted for urinary retention and hyponatremia. Repeat MRI reveals residual/recurrent L4-5 disc extrusion. Neurosurgery on board  Objective: Vitals:   02/15/19 1200 02/15/19 1244  BP: (!) 159/66 (!) 159/66  Pulse: 88   Resp: 14   Temp:  99.6 F (37.6 C)  SpO2: 99%     Intake/Output Summary (Last 24 hours) at 02/15/2019 1322 Last data filed at 02/15/2019 1100 Gross per 24 hour  Intake 1025.7 ml  Output 5225 ml  Net -4199.3 ml   Filed Weights   02/14/19 1940 02/14/19 1948  Weight: 47.6 kg 47.6 kg    Exam:   General:  Awake alert complains pain left leg  Cardiovascular: rrr no mgr no LE edema  Respiratory: normal effort BS clear bilaterally no wheeze  Abdomen: mildly distended mild diffuse tenderness to palpation. No guarding or rebounding BS sluggish  Musculoskeletal: joints without swelling/erythema   Neuro: LLE 4/5 strength RLE 5/5 strength  Data Reviewed: Basic Metabolic Panel: Recent Labs  Lab 02/14/19 2059 02/15/19 0609 02/15/19 1143  NA 118* 125* 125*  K 4.2 4.0 3.8  CL 88* 99 97*  CO2 21* 21* 19*  GLUCOSE 113* 95 90  BUN 21 14 12   CREATININE 1.35* 1.05* 1.07*  CALCIUM 8.9 8.0* 7.9*   Liver Function Tests: No results for input(s): AST, ALT, ALKPHOS, BILITOT, PROT, ALBUMIN in the last 168 hours. No results for input(s): LIPASE, AMYLASE in the last 168 hours. No results for input(s): AMMONIA in the last 168 hours. CBC: Recent Labs  Lab 02/14/19 2059 02/15/19 0609 02/15/19 1143  WBC 9.8 6.6 5.8  NEUTROABS 8.5*  --   --   HGB 8.6* 7.6*  7.9*  HCT 24.2* 21.2* 22.2*  MCV 101.3* 100.0 99.6  PLT 221 191 190   Cardiac Enzymes: No results for input(s): CKTOTAL, CKMB, CKMBINDEX, TROPONINI in the last 168 hours. BNP (last 3 results) No results for input(s): BNP in the last 8760 hours.  ProBNP (last 3  results) No results for input(s): PROBNP in the last 8760 hours.  CBG: No results for input(s): GLUCAP in the last 168 hours.  Recent Results (from the past 240 hour(s))  SARS Coronavirus 2     Status: None   Collection Time: 02/15/19 12:24 AM  Result Value Ref Range Status   SARS Coronavirus 2 NOT DETECTED NOT DETECTED Final    Comment: (NOTE) SARS-CoV-2 target nucleic acids are NOT DETECTED. The SARS-CoV-2 RNA is generally detectable in upper and lower respiratory specimens during the acute phase of infection.  Negative  results do not preclude SARS-CoV-2 infection, do not rule out co-infections with other pathogens, and should not be used as the sole basis for treatment or other patient management decisions.  Negative results must be combined with clinical observations, patient history, and epidemiological information. The expected result is Not Detected. Fact Sheet for Patients: http://www.biofiredefense.com/wp-content/uploads/2020/03/BIOFIRE-COVID -19-patients.pdf Fact Sheet for Healthcare Providers: http://www.biofiredefense.com/wp-content/uploads/2020/03/BIOFIRE-COVID -19-hcp.pdf This test is not yet approved or cleared by the [place] and  has been authorized for detection and/or diagnosis of SARS-CoV-2 by FDA under an Emergency Use Authorization (EUA).  This EUA will remain in effec t (meaning this test can be used) for the duration of  the COVID-19 declaration under Section 564(b)(1) of the Act, 21 U.S.C. section 360bbb-3(b)(1), unless the authorization is terminated or revoked sooner. Performed at [facility], [place] [address], [place], [place] [address]      Studies: Ct Abdomen Pelvis Wo Contrast  Result Date: 02/15/2019 CLINICAL DATA:  Constipation, urinary retention. Recent back surgery. EXAM: CT ABDOMEN AND PELVIS WITHOUT CONTRAST TECHNIQUE: Multidetector CT imaging of the abdomen and pelvis was performed following the standard protocol without  IV contrast. COMPARISON:  None. FINDINGS: Lower chest: Lung bases are clear. No effusions. Heart is normal size. Hepatobiliary: No focal hepatic abnormality. Gallbladder unremarkable. Pancreas: No focal abnormality or ductal dilatation. Spleen: No focal abnormality.  Normal size. Adrenals/Urinary Tract: Perinephric stranding bilaterally. No hydronephrosis. Adrenal glands unremarkable. Foley catheter within the bladder which is decompressed. Stomach/Bowel: Large stool burden in the colon. Stomach, large and small bowel grossly unremarkable. Vascular/Lymphatic: Diffuse aortic atherosclerosis. No evidence of aneurysm or adenopathy. Reproductive: Prior hysterectomy.  No adnexal masses. Other: No free fluid or free air. Musculoskeletal: Postoperative changes in the lumbar spine. No acute bony abnormality. IMPRESSION: Mild perinephric stranding bilaterally. No hydronephrosis. Foley catheter within the bladder which is decompressed. Large stool burden in the colon suggesting constipation. No evidence of bowel obstruction. Diffuse aortic atherosclerosis. Electronically Signed   By: Rolm Baptise M.D.   On: 02/15/2019 02:48   Mr Lumbar Spine Wo Contrast  Result Date: 02/15/2019 CLINICAL DATA:  73 y/o F; history of L4-5 herniated disc postsurgical decompression on 05/29 presenting with difficulty urinating. EXAM: MRI LUMBAR SPINE WITHOUT CONTRAST TECHNIQUE: Multiplanar, multisequence MR imaging of the lumbar spine was performed. No intravenous contrast was administered. COMPARISON:  01/28/2019 lumbar spine MRI. FINDINGS: Segmentation:  Standard. Alignment:  Physiologic. Vertebrae: L4-5 posterior instrumented fusion and bilateral laminectomy as well as left subarticular discectomy. There is a small fluid collection within the bilateral laminectomy beds extending into the left epidural space to the disc without significant surrounding bone marrow  edema. No findings of acute fracture or discitis. Conus medullaris and cauda  equina: Conus extends to the L1 level. Conus and cauda equina appear normal. Paraspinal and other soft tissues: There is edema within the subcutaneous fat and paraspinal muscles as well as well as multiple small multiloculated fluid collections along the posterior aspect of spinous processes extending from L3-L5. Left kidney cyst. Disc levels: T12-L1: No significant disc displacement, foraminal stenosis, or canal stenosis. L1-2: No significant disc displacement, foraminal stenosis, or canal stenosis. L2-3: Mild disc bulge and facet hypertrophy. No significant foraminal or spinal canal stenosis. L3-4: Progressed moderate disc bulge eccentric to the left with facet and ligamentum flavum hypertrophy. Moderate spinal canal stenosis. Partial effacement of left-greater-than-right lateral recesses. Mild-to-moderate left neural foraminal stenosis. L4-5: Status post laminectomy, discectomy, and posterior fusion. Residual/recurrent central disc extrusion with superior migration to L4 infra pedicular level results in moderate to severe spinal canal stenosis. Additionally, residual disc bulge, endplate marginal osteophytes, and facet hypertrophy moderately narrow the neural foramen bilaterally. L5-S1: Stable disc bulge, endplate marginal osteophytes eccentric to the right neural foramen, as well as facet hypertrophy. Severe right and mild left neural foraminal stenosis. No significant spinal canal stenosis. Partial effacement of lateral recesses. IMPRESSION: 1. L4-5 laminectomy, left subarticular discectomy, and posterior fusion. There are small fluid collections within the laminectomy beds, left L4-5 epidural space discectomy, and along the posterior aspect of spinous processes from L3-L5. No significant associated bone marrow edema, findings are favored to represent postoperative seroma over infection. 2. Residual/recurrent L4-5 central disc extrusion with superior migration resulting in moderate to severe spinal canal  stenosis. 3. Progressed disc bulge at L3-4 level with moderate spinal canal stenosis. 4. Multilevel mild and moderate neural foraminal stenosis. Stable severe right L5-S1 foraminal stenosis. Electronically Signed   By: Kristine Garbe M.D.   On: 02/15/2019 02:41    Scheduled Meds: . busPIRone  30 mg Oral BID  . diltiazem  360 mg Oral Daily  . hydrALAZINE  50 mg Oral Q8H  . losartan  100 mg Oral Daily  . polyethylene glycol  17 g Oral Daily  . predniSONE  10 mg Oral Q breakfast  . senna-docusate  1 tablet Oral BID  . sodium phosphate  1 enema Rectal Once  . spironolactone  12.5 mg Oral Daily   Continuous Infusions: . sodium chloride      Principal Problem:   Urinary retention Active Problems:   Hypertension   Lumbosacral radiculopathy   Constipation   Hyponatremia    Time spent: 11 minutes    [place] NP  [facility]  If 7PM-7AM, please contact night-coverage at [email or web address], password [facility] 02/15/2019, 1:22 PM  LOS: 0 days

## 2019-02-16 LAB — CBC
HCT: 22.1 % — ABNORMAL LOW (ref 36.0–46.0)
Hemoglobin: 8 g/dL — ABNORMAL LOW (ref 12.0–15.0)
MCH: 36.2 pg — ABNORMAL HIGH (ref 26.0–34.0)
MCHC: 36.2 g/dL — ABNORMAL HIGH (ref 30.0–36.0)
MCV: 100 fL (ref 80.0–100.0)
Platelets: 188 10*3/uL (ref 150–400)
RBC: 2.21 MIL/uL — ABNORMAL LOW (ref 3.87–5.11)
RDW: 14.1 % (ref 11.5–15.5)
WBC: 5.8 10*3/uL (ref 4.0–10.5)
nRBC: 0 % (ref 0.0–0.2)

## 2019-02-16 LAB — URINE CULTURE: Culture: NO GROWTH

## 2019-02-16 LAB — BASIC METABOLIC PANEL
Anion gap: 7 (ref 5–15)
BUN: 16 mg/dL (ref 8–23)
CO2: 20 mmol/L — ABNORMAL LOW (ref 22–32)
Calcium: 7.9 mg/dL — ABNORMAL LOW (ref 8.9–10.3)
Chloride: 98 mmol/L (ref 98–111)
Creatinine, Ser: 1.19 mg/dL — ABNORMAL HIGH (ref 0.44–1.00)
GFR calc Af Amer: 52 mL/min — ABNORMAL LOW (ref >60)
GFR calc non Af Amer: 45 mL/min — ABNORMAL LOW (ref >60)
Glucose, Bld: 101 mg/dL — ABNORMAL HIGH (ref 70–99)
Potassium: 4.1 mmol/L (ref 3.5–5.1)
Sodium: 125 mmol/L — ABNORMAL LOW (ref 135–145)

## 2019-02-16 MED ORDER — SODIUM CHLORIDE 0.9 % IV SOLN
INTRAVENOUS | Status: AC
  Administered 2019-02-16: 11:00:00 via INTRAVENOUS

## 2019-02-16 NOTE — Progress Notes (Signed)
Physical Therapy Treatment Patient Details Name: Regina Ortiz MRN: [id number] DOB: Mar 12, 1946 Today's Date: 02/16/2019    History of Present Illness 73 y.o. female admitted on 02/14/19 for urinary retention, perineal hypoesthesia, lumbosacral radiculopathy, hyponatremia, constipation.  Pt with significant PMH of SOB with exertion, LBBB, HTN, COPD, anxiety, L4/5 decompression and fusion on 01/28/19 with d/c to [facility] SNF x 8 days, then home.      PT Comments    Pt with depressed spirits, increased pain, and anxiety re: not being able to void since the removal of the catheter. Pt c/o "I just don't feel good and I have never felt good since this surgery." Progressed ambulation to L HHA x 200' with min guard. Completed stair negotiation. Acute PT to cont to follow.    Follow Up Recommendations  Home health PT;Supervision - Intermittent     Equipment Recommendations  None recommended by PT    Recommendations for Other Services       Precautions / Restrictions Precautions Precautions: Back Precaution Booklet Issued: No Precaution Comments: pt able to recall 3/3 back precautions but con't to require verbal cues to adhere to them functionally Required Braces or Orthoses: Spinal Brace Spinal Brace: Lumbar corset;Applied in sitting position(however pt has left it at home) Restrictions Weight Bearing Restrictions: No    Mobility  Bed Mobility Overal bed mobility: Needs Assistance Bed Mobility: Rolling;Sidelying to Sit;Sit to Sidelying Rolling: Supervision Sidelying to sit: Supervision     Sit to sidelying: Supervision General bed mobility comments: verbal cues to minimize twisting, increasd time but no physical assist  Transfers Overall transfer level: Needs assistance Equipment used: None Transfers: Sit to/from Stand Sit to Stand: Min guard         General transfer comment: increased time, min guard to steady pt  Ambulation/Gait Ambulation/Gait assistance: Min  guard Gait Distance (Feet): 200 Feet Assistive device: 1 person hand held assist Gait Pattern/deviations: Step-through pattern;Decreased stride length;Trunk flexed Gait velocity: decreased   General Gait Details: pt holding onto PTs hand but minimal pressure more for balance. Pt with report "I just don't feel good" no episodes of LOB   Stairs Stairs: Yes Stairs assistance: Min guard Stair Management: One rail Left;Alternating pattern;Forwards Number of Stairs: 12 General stair comments: increased time but steady and safe, min guard via HHA for balance   Wheelchair Mobility    Modified Rankin (Stroke Patients Only)       Balance Overall balance assessment: Needs assistance Sitting-balance support: Feet supported Sitting balance-Leahy Scale: Good     Standing balance support: Single extremity supported Standing balance-Leahy Scale: Fair Standing balance comment: prefers unilateral support to none during ambulation                            Cognition Arousal/Alertness: Awake/alert Behavior During Therapy: Flat affect Overall Cognitive Status: Within Functional Limits for tasks assessed                                 General Comments: pt with depressed spirits reporting "I have just felt so bad since this surgery. I"m really worried about not being able to pee"      Exercises      General Comments General comments (skin integrity, edema, etc.): pt with bruising at incision site      Pertinent Vitals/Pain Pain Assessment: 0-10 Pain Score: 8  Pain Location: back and down both  legs Pain Descriptors / Indicators: Radiating Pain Intervention(s): Monitored during session    Home Living                      Prior Function            PT Goals (current goals can now be found in the care plan section) Acute Rehab PT Goals Patient Stated Goal: feel better Progress towards PT goals: Progressing toward goals    Frequency     Min 3X/week      PT Plan Current plan remains appropriate    Co-evaluation              AM-PAC PT "6 Clicks" Mobility   Outcome Measure  Help needed turning from your back to your side while in a flat bed without using bedrails?: None Help needed moving from lying on your back to sitting on the side of a flat bed without using bedrails?: None Help needed moving to and from a bed to a chair (including a wheelchair)?: None Help needed standing up from a chair using your arms (e.g., wheelchair or bedside chair)?: A Little Help needed to walk in hospital room?: A Little Help needed climbing 3-5 steps with a railing? : A Little 6 Click Score: 21    End of Session Equipment Utilized During Treatment: Gait belt Activity Tolerance: Patient tolerated treatment well Patient left: in bed;with call bell/phone within reach Nurse Communication: Mobility status PT Visit Diagnosis: Difficulty in walking, not elsewhere classified (R26.2)     Time: 1100-1114 PT Time Calculation (min) (ACUTE ONLY): 14 min  Charges:  $Gait Training: 8-22 mins                     [name], PT, DPT Acute Rehabilitation Services Pager #: [phone] Office #: [phone]    [name] 02/16/2019, 11:20 AM

## 2019-02-16 NOTE — TOC Progression Note (Signed)
Transition of Care [facility]) - Progression Note    Patient Details  Name: [name] MRN: [id number] Date of Birth: 16-Jan-1946  Transition of [facility] CM/SW [place], [place] Phone Number: 02/16/2019, 2:59 PM  Clinical Narrative:    Pt son called back, their first choice for [facility] is [facility], referral for PT services made and CSW will let family know that [name] accepted.    Expected Discharge Plan: [place] Barriers to Discharge: Continued Medical Work up  Expected Discharge Plan and Services Expected Discharge Plan: [place] In-house Referral: Clinical Social Work Discharge Planning Services: CM Consult Post Acute Care Choice: NA Living arrangements for the past 2 months: [place], [place] Expected Discharge Date: 02/17/19                         HH Arranged: PT [place]: [place] Date [place]: 02/16/19 Time [place]: [id number] Representative spoke with at [place]: [place] ([place]) Interventions    Readmission Risk Interventions Readmission Risk Prevention Plan 02/16/2019  Transportation Screening Complete  PCP or Specialist Appt within 3-5 Days Complete  HRI or [place] Complete  Social Work Consult for [place] Planning/Counseling Complete  Palliative Care Screening Not Applicable  Medication Review Press photographer) Complete  Some recent data might be hidden

## 2019-02-16 NOTE — TOC Progression Note (Signed)
Transition of Care [facility]) - Progression Note    Patient Details  Name: Regina Ortiz MRN: [id number] Date of Birth: [birth year]  Transition of Care [facility]) CM/SW [place], [place] Phone Number: 02/16/2019, 12:47 PM  Clinical Narrative:    CSW spoke with pt son Shanon Brow and securely sent [facility] agency list to [email or web address].  PCP appointment made at pt primary care physicians office for July 1st at 11:30. All intructions added to follow up providers list.    Expected Discharge Plan: [place] Barriers to Discharge: Continued Medical Work up  Expected Discharge Plan and Services Expected Discharge Plan: [place] In-house Referral: Clinical Social Work Discharge Planning Services: CM Consult Post Acute Care Choice: NA Living arrangements for the past 2 months: [place], [place] Expected Discharge Date: 02/17/19               HH Arranged: OT, PT, RN   Social Determinants of Health (SDOH) Interventions    Readmission Risk Interventions Readmission Risk Prevention Plan 02/16/2019  Transportation Screening Complete  PCP or Specialist Appt within 3-5 Days Complete  HRI or [place] Complete  Social Work Consult for [place] Planning/Counseling Complete  Palliative Care Screening Not Applicable  Medication Review [employer] photographer) Complete  Some recent data might be hidden

## 2019-02-16 NOTE — Progress Notes (Signed)
Patient ID: Regina Ortiz, female   DOB: 1946-05-04, 73 y.o.   MRN: [id number] Patient has been on small dose of prednisone 10 mg daily.  Instead of using Decadron I have written for 20 mg twice a day to see if this does not improve her functional status in regards to her lower extremities.  Her bowels have moved today.  She is feeling somewhat more upbeat today.  Bladder issues have been longstanding and I believe a urology consult would be of benefit.

## 2019-02-16 NOTE — Progress Notes (Signed)
[facility] PROGRESS NOTE  Regina Ortiz WLN:[id number] DOB: 09-03-1945 DOA: 02/14/2019 PCP: Maurice Small, MD  Assessment/Plan:  Urinary retention,perineal hypoesthesia, lumbosacral radiculopathy. S/P emergent surgery 01/29/19. Repeat MRI reveals residual/recurrent L4-5 central disc extrusion with superior migration resulting in moderate to severe spinal canal stenosis. Progressed disc bulge at L3-4 level with moderate spinal canal stenosis. Multilevel mild and moderate neural foraminal stenosis. Stable severe right L5-S1 foraminal stenosis. Evaluated by neurosurgery who recommended decadron for postoperative edema and unclear if surgical decompression would improve situation. PT note indicates minimal assist with BR.  -appreciate neurosurgery in put -will discontinue foley for bladder trials -prn bladder scan -monitor intake and output -continue gentle IV fluids as oral input unreliable  Acute on chronic hyponatremia. Remains at baseline this am. Hx chronic. Baseline appears to be 127. This am sodium 125. Serum osmolality 258, urine sodium 45, urine osmolality 146 - continue gentle IV fluid hydration -monitor  Constipation CT showing large stool burden suggesting constipation. No evidence of bowel obstruction. Was given Miralax and senokot. Dismal results. Provided with enema with continued good results.  -continue senokot -stop miralax -maintain hydration -monitor intake and output  Chronic anemia Hg 8.0. anemia panel within limits of normal. No s/sx active bleeding.  -monitor  COPD -Stable. No shortness of breath or wheezing. DuoNebs PRN.  Hypertension. Fair control. Home meds include losartan, diltazem and apresoline and aldactone.  -continue home meds -monitor   Code Status: full Family Communication:  Disposition Plan: home hopefully tomorrow   Consultants:  Dr Ellene Route  Procedures:    Antibiotics:    HPI/Subjective: 73 yo s/p decompression  and fusion L4-L5 on 01/28/19 with continuing cauda equina syndrome. Urine retention that worsened. Admitted for urinary retention and hyponatremia and  Constipation.  Repeat MRI reveals residual/recurrent L4-5 disc extrusion. Neurosurgery on board  Improved this am in terms of pain management and mobility. Good results from enema.   Objective: Vitals:   02/16/19 0400 02/16/19 0800  BP: (!) 176/77 (!) 172/57  Pulse: 88 95  Resp: 16 14  Temp:  99.1 F (37.3 C)  SpO2: 100% 100%    Intake/Output Summary (Last 24 hours) at 02/16/2019 7408 Last data filed at 02/16/2019 0900 Gross per 24 hour  Intake 1052.8 ml  Output 4051 ml  Net -2998.2 ml   Filed Weights   02/14/19 1940 02/14/19 1948  Weight: 47.6 kg 47.6 kg    Exam:   General:  Awake alert whiny no acute distress  Cardiovascular: rrr no mgr no LE edema  Respiratory: normal effort BS clear bilaterally no wheeze  Abdomen: non-distended non-tender +BS no guarding or rebounding  Musculoskeletal: incision lumbar spine clean. Mild edema. Joints without swelling/erythema   Data Reviewed: Basic Metabolic Panel: Recent Labs  Lab 02/14/19 2059 02/15/19 0609 02/15/19 1143 02/16/19 0301  NA 118* 125* 125* 125*  K 4.2 4.0 3.8 4.1  CL 88* 99 97* 98  CO2 21* 21* 19* 20*  GLUCOSE 113* 95 90 101*  BUN 21 14 12 16   CREATININE 1.35* 1.05* 1.07* 1.19*  CALCIUM 8.9 8.0* 7.9* 7.9*   Liver Function Tests: No results for input(s): AST, ALT, ALKPHOS, BILITOT, PROT, ALBUMIN in the last 168 hours. No results for input(s): LIPASE, AMYLASE in the last 168 hours. No results for input(s): AMMONIA in the last 168 hours. CBC: Recent Labs  Lab 02/14/19 2059 02/15/19 0609 02/15/19 1143 02/16/19 0301  WBC 9.8 6.6 5.8 5.8  NEUTROABS 8.5*  --   --   --  HGB 8.6* 7.6* 7.9* 8.0*  HCT 24.2* 21.2* 22.2* 22.1*  MCV 101.3* 100.0 99.6 100.0  PLT 221 191 190 188   Cardiac Enzymes: No results for input(s): CKTOTAL, CKMB, CKMBINDEX, TROPONINI  in the last 168 hours. BNP (last 3 results) No results for input(s): BNP in the last 8760 hours.  ProBNP (last 3 results) No results for input(s): PROBNP in the last 8760 hours.  CBG: No results for input(s): GLUCAP in the last 168 hours.  Recent Results (from the past 240 hour(s))  Urine culture     Status: None   Collection Time: 02/14/19  8:33 PM   Specimen: Urine, Random  Result Value Ref Range Status   Specimen Description URINE, RANDOM  Final   Special Requests NONE  Final   Culture   Final    NO GROWTH Performed at [facility], [address] [place], [place] [address]    Report Status 02/16/2019 FINAL  Final  SARS Coronavirus 2     Status: None   Collection Time: 02/15/19 12:24 AM  Result Value Ref Range Status   SARS Coronavirus 2 NOT DETECTED NOT DETECTED Final    Comment: (NOTE) SARS-CoV-2 target nucleic acids are NOT DETECTED. The SARS-CoV-2 RNA is generally detectable in upper and lower respiratory specimens during the acute phase of infection.  Negative  results do not preclude SARS-CoV-2 infection, do not rule out co-infections with other pathogens, and should not be used as the sole basis for treatment or other patient management decisions.  Negative results must be combined with clinical observations, patient history, and epidemiological information. The expected result is Not Detected. Fact Sheet for Patients: http://www.biofiredefense.com/wp-content/uploads/2020/03/BIOFIRE-COVID -19-patients.pdf Fact Sheet for Healthcare Providers: http://www.biofiredefense.com/wp-content/uploads/2020/03/BIOFIRE-COVID -19-hcp.pdf This test is not yet approved or cleared by the [place] and  has been authorized for detection and/or diagnosis of SARS-CoV-2 by FDA under an Emergency Use Authorization (EUA).  This EUA will remain in effec t (meaning this test can be used) for the duration of  the COVID-19 declaration under Section 564(b)(1) of the Act,  21 U.S.C. section 360bbb-3(b)(1), unless the authorization is terminated or revoked sooner. Performed at [facility], [place] [address] [place], [place] [address]      Studies: Ct Abdomen Pelvis Wo Contrast  Result Date: 02/15/2019 CLINICAL DATA:  Constipation, urinary retention. Recent back surgery. EXAM: CT ABDOMEN AND PELVIS WITHOUT CONTRAST TECHNIQUE: Multidetector CT imaging of the abdomen and pelvis was performed following the standard protocol without IV contrast. COMPARISON:  None. FINDINGS: Lower chest: Lung bases are clear. No effusions. Heart is normal size. Hepatobiliary: No focal hepatic abnormality. Gallbladder unremarkable. Pancreas: No focal abnormality or ductal dilatation. Spleen: No focal abnormality.  Normal size. Adrenals/Urinary Tract: Perinephric stranding bilaterally. No hydronephrosis. Adrenal glands unremarkable. Foley catheter within the bladder which is decompressed. Stomach/Bowel: Large stool burden in the colon. Stomach, large and small bowel grossly unremarkable. Vascular/Lymphatic: Diffuse aortic atherosclerosis. No evidence of aneurysm or adenopathy. Reproductive: Prior hysterectomy.  No adnexal masses. Other: No free fluid or free air. Musculoskeletal: Postoperative changes in the lumbar spine. No acute bony abnormality. IMPRESSION: Mild perinephric stranding bilaterally. No hydronephrosis. Foley catheter within the bladder which is decompressed. Large stool burden in the colon suggesting constipation. No evidence of bowel obstruction. Diffuse aortic atherosclerosis. Electronically Signed   By: Rolm Baptise M.D.   On: 02/15/2019 02:48   Mr Lumbar Spine Wo Contrast  Result Date: 02/15/2019 CLINICAL DATA:  73 y/o F; history of L4-5 herniated  disc postsurgical decompression on 05/29 presenting with difficulty urinating. EXAM: MRI LUMBAR SPINE WITHOUT CONTRAST TECHNIQUE: Multiplanar, multisequence MR imaging of the lumbar spine was performed. No intravenous contrast  was administered. COMPARISON:  01/28/2019 lumbar spine MRI. FINDINGS: Segmentation:  Standard. Alignment:  Physiologic. Vertebrae: L4-5 posterior instrumented fusion and bilateral laminectomy as well as left subarticular discectomy. There is a small fluid collection within the bilateral laminectomy beds extending into the left epidural space to the disc without significant surrounding bone marrow edema. No findings of acute fracture or discitis. Conus medullaris and cauda equina: Conus extends to the L1 level. Conus and cauda equina appear normal. Paraspinal and other soft tissues: There is edema within the subcutaneous fat and paraspinal muscles as well as well as multiple small multiloculated fluid collections along the posterior aspect of spinous processes extending from L3-L5. Left kidney cyst. Disc levels: T12-L1: No significant disc displacement, foraminal stenosis, or canal stenosis. L1-2: No significant disc displacement, foraminal stenosis, or canal stenosis. L2-3: Mild disc bulge and facet hypertrophy. No significant foraminal or spinal canal stenosis. L3-4: Progressed moderate disc bulge eccentric to the left with facet and ligamentum flavum hypertrophy. Moderate spinal canal stenosis. Partial effacement of left-greater-than-right lateral recesses. Mild-to-moderate left neural foraminal stenosis. L4-5: Status post laminectomy, discectomy, and posterior fusion. Residual/recurrent central disc extrusion with superior migration to L4 infra pedicular level results in moderate to severe spinal canal stenosis. Additionally, residual disc bulge, endplate marginal osteophytes, and facet hypertrophy moderately narrow the neural foramen bilaterally. L5-S1: Stable disc bulge, endplate marginal osteophytes eccentric to the right neural foramen, as well as facet hypertrophy. Severe right and mild left neural foraminal stenosis. No significant spinal canal stenosis. Partial effacement of lateral recesses. IMPRESSION:  1. L4-5 laminectomy, left subarticular discectomy, and posterior fusion. There are small fluid collections within the laminectomy beds, left L4-5 epidural space discectomy, and along the posterior aspect of spinous processes from L3-L5. No significant associated bone marrow edema, findings are favored to represent postoperative seroma over infection. 2. Residual/recurrent L4-5 central disc extrusion with superior migration resulting in moderate to severe spinal canal stenosis. 3. Progressed disc bulge at L3-4 level with moderate spinal canal stenosis. 4. Multilevel mild and moderate neural foraminal stenosis. Stable severe right L5-S1 foraminal stenosis. Electronically Signed   By: Kristine Garbe M.D.   On: 02/15/2019 02:41    Scheduled Meds: . busPIRone  30 mg Oral BID  . diltiazem  360 mg Oral Daily  . feeding supplement  1 Container Oral TID BM  . hydrALAZINE  50 mg Oral Q8H  . losartan  100 mg Oral Daily  . polyethylene glycol  17 g Oral Daily  . predniSONE  20 mg Oral BID WC  . senna-docusate  1 tablet Oral BID  . spironolactone  12.5 mg Oral Daily   Continuous Infusions: . sodium chloride      Principal Problem:   Urinary retention Active Problems:   Hypertension   Lumbosacral radiculopathy   Constipation   Hyponatremia    Time spent: 59 minutes    [place] NP  [facility]  If 7PM-7AM, please contact night-coverage at [email or web address], password [facility] 02/16/2019, 9:24 AM  LOS: 1 day

## 2019-02-16 NOTE — Progress Notes (Signed)
Patient foley removed around 1100 am, bladder scan revealed 104. Patient has not voided until this time and bladder scan showed 212. Will continue to monitor patient.

## 2019-02-17 LAB — BASIC METABOLIC PANEL
Anion gap: 10 (ref 5–15)
BUN: 18 mg/dL (ref 8–23)
CO2: 17 mmol/L — ABNORMAL LOW (ref 22–32)
Calcium: 8.5 mg/dL — ABNORMAL LOW (ref 8.9–10.3)
Chloride: 98 mmol/L (ref 98–111)
Creatinine, Ser: 1.26 mg/dL — ABNORMAL HIGH (ref 0.44–1.00)
GFR calc Af Amer: 49 mL/min — ABNORMAL LOW (ref >60)
GFR calc non Af Amer: 42 mL/min — ABNORMAL LOW (ref >60)
Glucose, Bld: 189 mg/dL — ABNORMAL HIGH (ref 70–99)
Potassium: 3.9 mmol/L (ref 3.5–5.1)
Sodium: 125 mmol/L — ABNORMAL LOW (ref 135–145)

## 2019-02-17 MED ORDER — BETHANECHOL CHLORIDE 10 MG PO TABS
5.0000 mg | ORAL_TABLET | Freq: Three times a day (TID) | ORAL | Status: AC
  Administered 2019-02-17 – 2019-02-20 (×9): 5 mg via ORAL
  Filled 2019-02-17 (×9): qty 1

## 2019-02-17 NOTE — Progress Notes (Signed)
Patient voided 429ml post foley removal on previous shift at [place].  She then voided 157ml at approximately 2000 and post void bladder scan revealed 459ml.  Patient promptly needed to void again and voided 256ml.  Post void bladder scan after that void was 122ml.  Patient voided 273ml at 0400 and post void bladder scan revealed 549ml.  I notified K. Kirby-Graham,NP of the above and new orders were placed in [facility] and carried out. At 0530 In and out cath performed using sterile technique which patient tolerated well and emptied 1091ml of clear yellow urine from the patients bladder.  Nursing staff to continue to monitor

## 2019-02-17 NOTE — Progress Notes (Signed)
PROGRESS NOTE  Regina Ortiz TMH:[id number] DOB: 17-Dec-1945 DOA: 02/14/2019 PCP: Maurice Small, MD  HPI/Recap of past 24 hours: Regina Ortiz is a 73 y.o. female with medical history significant of herniated disc L4/5 with cauda equina syndrome status post surgical decompression on Jan 28, 2019 by Dr. Ellene Route, chronic hyponatremia, COPD, GERD, hypertension, LBBB, macrocytosis, seronegative rheumatoid arthritis, chronic venous insufficiency, and conditions listed below presenting to the hospital for evaluation of urinary retention.  Patient reports having trouble urinating since before her surgery but states since she had her back surgery this problem has been worse.  For the past few days she has not been been able to empty her bladder completely.  Every time she goes to the bathroom it is just a few drops of urine.  This morning around 6:30 AM she passed a few drops of urine but has not urinated since then.  States she is supposed to see a urologist this week.  Also reports having severe constipation for the past few days.  Her last regular bowel movement was several days ago.  She is also having some abdominal fullness/discomfort.  Denies any nausea or vomiting.  For the past few days she has been able to pass just a tiny amount of stool.  States for the past few days her genital region is feeling slightly numb.  States her lumbar back pain has been stable since her surgery.  States she continues to have numbness and tingling in her buttock and legs.  She continues to have weakness in both of her legs, especially on the left side.  Patient is not able to tell me if her symptoms are stable since the surgery or worse.  States her sodium is always low.  No headaches or seizures.  Reports having decreased p.o. intake.  ED Course: Vital signs stable on arrival.  No leukocytosis.  Hemoglobin 8.6, baseline in the 9-11 range.  Sodium 118, previously ranging in the upper 120s.  BUN 21.  Creatinine 1.3, baseline  1.0-1.1.  UA not suggestive of infection.  Urine culture pending.  Foley inserted in the ED with 1 L urine output.    02/17/19: Patient seen and examined her bedside.  She is concerned about urinary retention.  She reports suprapubic discomfort.  Negative UA on 02/14/2019.  Started on Bethanechol.   Assessment/Plan: Principal Problem:   Urinary retention Active Problems:   Hyponatremia   Hypertension   Lumbosacral radiculopathy   Constipation  Urinary retention post back surgery Started on bethanechol Monitor urine output Status post emergent surgery on 01/29/2019 Repeat MRI reveals residual/recurrent L4-5 central disc extrusion with superior migration resulting in moderate to severe spinal canal stenosis. Progressed disc bulge at L3-4 level with moderate spinal canal stenosis. Multilevel mild and moderate neural foraminal stenosis. Stable severe right L5-S1 foraminal stenosis. Evaluated by neurosurgery who recommended decadron for postoperative edema and unclear if surgical decompression would improve situation. PT note indicates minimal assist with BR.  -appreciate neurosurgery in put -will discontinue foley for bladder trials -prn bladder scan -monitor intake and output -continue gentle IV fluids as oral input unreliable  Acute on chronic euvolemic hyponatremia Her baseline sodium appears to be 127 Obtain urine osmolality, serum osmolality, urine sodium. Currently on gentle IV fluid hydration normal saline at 50 cc/h Start free water fluid restriction Repeat BMP in the morning  Chronic constipation No evidence of bowel obstruction on CT Continue bowel regimen  Diastolic CHF noted Last 2D echo revealed EF 55 to 60%  in 2019 I's and O's and daily weight No acute issues  Chronic anemiaHg 8.0. anemia panel within limits of normal. No s/sx active bleeding.  -monitor  COPD -Stable. No shortness of breath or wheezing. DuoNebs PRN.  Hypertension.  Blood pressure is  normotensive.  Continue home meds include losartan, diltazem and apresoline and aldactone.   Physical debility/ambulatory dysfunction PT recommended home health PT Further questions    Code Status: full Family Communication: We will call if okay with the patient. Disposition Plan:  Possible discharge tomorrow when urinary retention and sodium level improved.  Consultants:  Dr elsner     Objective: Vitals:   02/16/19 2358 02/17/19 0349 02/17/19 0729 02/17/19 1221  BP: (!) 159/73 (!) 161/74 (!) 158/56 (!) 145/53  Pulse: 79 85 94 90  Resp:   18 16  Temp: 98.2 F (36.8 C) 98.6 F (37 C) 97.9 F (36.6 C) 98 F (36.7 C)  TempSrc: Oral Oral Oral Oral  SpO2: 99% 99% 100% 98%  Weight:      Height:        Intake/Output Summary (Last 24 hours) at 02/17/2019 1500 Last data filed at 02/17/2019 1348 Gross per 24 hour  Intake 1667.51 ml  Output 2100 ml  Net -432.49 ml   Filed Weights   02/14/19 1940 02/14/19 1948  Weight: 47.6 kg 47.6 kg    Exam:  . General: 73 y.o. year-old female well developed well nourished in no acute distress.  Alert and oriented x3. . Cardiovascular: Regular rate and rhythm with no rubs or gallops.  No thyromegaly or JVD noted.   Marland Kitchen Respiratory: Clear to auscultation with no wheezes or rales. Good inspiratory effort. . Abdomen: Soft nontender nondistended with normal bowel sounds x4 quadrants. . Musculoskeletal: No lower extremity edema. 2/4 pulses in all 4 extremities. Marland Kitchen Psychiatry: Mood is appropriate for condition and setting   Data Reviewed: CBC: Recent Labs  Lab 02/14/19 2059 02/15/19 0609 02/15/19 1143 02/16/19 0301  WBC 9.8 6.6 5.8 5.8  NEUTROABS 8.5*  --   --   --   HGB 8.6* 7.6* 7.9* 8.0*  HCT 24.2* 21.2* 22.2* 22.1*  MCV 101.3* 100.0 99.6 100.0  PLT 221 191 190 482   Basic Metabolic Panel: Recent Labs  Lab 02/14/19 2059 02/15/19 0609 02/15/19 1143 02/16/19 0301 02/17/19 0729  NA 118* 125* 125* 125* 125*  K 4.2 4.0  3.8 4.1 3.9  CL 88* 99 97* 98 98  CO2 21* 21* 19* 20* 17*  GLUCOSE 113* 95 90 101* 189*  BUN 21 14 12 16 18   CREATININE 1.35* 1.05* 1.07* 1.19* 1.26*  CALCIUM 8.9 8.0* 7.9* 7.9* 8.5*   GFR: Estimated Creatinine Clearance: 29.9 mL/min (A) (by C-G formula based on SCr of 1.26 mg/dL (H)). Liver Function Tests: No results for input(s): AST, ALT, ALKPHOS, BILITOT, PROT, ALBUMIN in the last 168 hours. No results for input(s): LIPASE, AMYLASE in the last 168 hours. No results for input(s): AMMONIA in the last 168 hours. Coagulation Profile: No results for input(s): INR, PROTIME in the last 168 hours. Cardiac Enzymes: No results for input(s): CKTOTAL, CKMB, CKMBINDEX, TROPONINI in the last 168 hours. BNP (last 3 results) No results for input(s): PROBNP in the last 8760 hours. HbA1C: No results for input(s): HGBA1C in the last 72 hours. CBG: No results for input(s): GLUCAP in the last 168 hours. Lipid Profile: No results for input(s): CHOL, HDL, LDLCALC, TRIG, CHOLHDL, LDLDIRECT in the last 72 hours. Thyroid Function Tests: No results for  input(s): TSH, T4TOTAL, FREET4, T3FREE, THYROIDAB in the last 72 hours. Anemia Panel: Recent Labs    02/15/19 0609  VITAMINB12 1,298*  FOLATE 34.6  FERRITIN 172  TIBC 272  IRON 44  RETICCTPCT 4.2*   Urine analysis:    Component Value Date/Time   COLORURINE YELLOW 02/14/2019 2141   APPEARANCEUR CLEAR 02/14/2019 2141   LABSPEC 1.008 02/14/2019 2141   PHURINE 7.0 02/14/2019 2141   GLUCOSEU NEGATIVE 02/14/2019 2141   HGBUR NEGATIVE 02/14/2019 2141   BILIRUBINUR NEGATIVE 02/14/2019 2141   KETONESUR NEGATIVE 02/14/2019 2141   PROTEINUR 30 (A) 02/14/2019 2141   UROBILINOGEN 0.2 09/22/2012 1100   NITRITE NEGATIVE 02/14/2019 2141   LEUKOCYTESUR NEGATIVE 02/14/2019 2141   Sepsis Labs: @LABRCNTIP (procalcitonin:4,lacticidven:4)  ) Recent Results (from the past 240 hour(s))  Urine culture     Status: None   Collection Time: 02/14/19  8:33 PM    Specimen: Urine, Random  Result Value Ref Range Status   Specimen Description URINE, RANDOM  Final   Special Requests NONE  Final   Culture   Final    NO GROWTH Performed at [facility], [place] [address] [place], [place] [address]    Report Status 02/16/2019 FINAL  Final  SARS Coronavirus 2     Status: None   Collection Time: 02/15/19 12:24 AM  Result Value Ref Range Status   SARS Coronavirus 2 NOT DETECTED NOT DETECTED Final    Comment: (NOTE) SARS-CoV-2 target nucleic acids are NOT DETECTED. The SARS-CoV-2 RNA is generally detectable in upper and lower respiratory specimens during the acute phase of infection.  Negative  results do not preclude SARS-CoV-2 infection, do not rule out co-infections with other pathogens, and should not be used as the sole basis for treatment or other patient management decisions.  Negative results must be combined with clinical observations, patient history, and epidemiological information. The expected result is Not Detected. Fact Sheet for Patients: http://www.biofiredefense.com/wp-content/uploads/2020/03/BIOFIRE-COVID -19-patients.pdf Fact Sheet for Healthcare Providers: http://www.biofiredefense.com/wp-content/uploads/2020/03/BIOFIRE-COVID -19-hcp.pdf This test is not yet approved or cleared by the [place] and  has been authorized for detection and/or diagnosis of SARS-CoV-2 by FDA under an Emergency Use Authorization (EUA).  This EUA will remain in effec t (meaning this test can be used) for the duration of  the COVID-19 declaration under Section 564(b)(1) of the Act, 21 U.S.C. section 360bbb-3(b)(1), unless the authorization is terminated or revoked sooner. Performed at [facility], [place] [address] [place], [place] [address]       Studies: No results found.  Scheduled Meds: . bethanechol  5 mg Oral TID  . busPIRone  30 mg Oral BID  . diltiazem  360 mg Oral Daily  . feeding supplement  1 Container Oral TID  BM  . hydrALAZINE  50 mg Oral Q8H  . losartan  100 mg Oral Daily  . predniSONE  20 mg Oral BID WC  . senna-docusate  1 tablet Oral BID  . spironolactone  12.5 mg Oral Daily    Continuous Infusions:   LOS: 2 days     Kayleen Memos, MD [facility] Pager [phone]  If 7PM-7AM, please contact night-coverage [email or web address] Password [email or web address] 02/17/2019, 3:00 PM

## 2019-02-18 LAB — BASIC METABOLIC PANEL
Anion gap: 8 (ref 5–15)
Anion gap: 8 (ref 5–15)
BUN: 23 mg/dL (ref 8–23)
BUN: 30 mg/dL — ABNORMAL HIGH (ref 8–23)
CO2: 19 mmol/L — ABNORMAL LOW (ref 22–32)
CO2: 21 mmol/L — ABNORMAL LOW (ref 22–32)
Calcium: 8.5 mg/dL — ABNORMAL LOW (ref 8.9–10.3)
Calcium: 9.2 mg/dL (ref 8.9–10.3)
Chloride: 96 mmol/L — ABNORMAL LOW (ref 98–111)
Chloride: 92 mmol/L — ABNORMAL LOW (ref 98–111)
Creatinine, Ser: 1 mg/dL (ref 0.44–1.00)
Creatinine, Ser: 1.32 mg/dL — ABNORMAL HIGH (ref 0.44–1.00)
GFR calc Af Amer: >60 mL/min (ref >60)
GFR calc Af Amer: 46 mL/min — ABNORMAL LOW (ref >60)
GFR calc non Af Amer: 56 mL/min — ABNORMAL LOW (ref >60)
GFR calc non Af Amer: 40 mL/min — ABNORMAL LOW (ref >60)
Glucose, Bld: 105 mg/dL — ABNORMAL HIGH (ref 70–99)
Glucose, Bld: 118 mg/dL — ABNORMAL HIGH (ref 70–99)
Potassium: 4 mmol/L (ref 3.5–5.1)
Potassium: 4.5 mmol/L (ref 3.5–5.1)
Sodium: 123 mmol/L — ABNORMAL LOW (ref 135–145)
Sodium: 121 mmol/L — ABNORMAL LOW (ref 135–145)

## 2019-02-18 LAB — OSMOLALITY: Osmolality: 260 mOsm/kg — ABNORMAL LOW (ref 275–295)

## 2019-02-18 LAB — URIC ACID: Uric Acid, Serum: 3.2 mg/dL (ref 2.5–7.1)

## 2019-02-18 MED ORDER — SODIUM CHLORIDE 1 G PO TABS
1.0000 g | ORAL_TABLET | Freq: Two times a day (BID) | ORAL | Status: DC
  Administered 2019-02-18 – 2019-02-22 (×8): 1 g via ORAL
  Filled 2019-02-18 (×8): qty 1

## 2019-02-18 NOTE — Progress Notes (Addendum)
PROGRESS NOTE  Regina Ortiz EXH:[id number] DOB: July 29, 1946 DOA: 02/14/2019 PCP: Maurice Small, MD  HPI/Recap of past 24 hours: Regina Ortiz is a 73 y.o. female with medical history significant of herniated disc L4/5 with cauda equina syndrome status post surgical decompression on Jan 28, 2019 by Dr. Ellene Route, chronic hyponatremia, COPD, GERD, hypertension, LBBB, macrocytosis, seronegative rheumatoid arthritis, chronic venous insufficiency, and conditions listed below presenting to the hospital for evaluation of urinary retention.   Sodium 118, previously ranging in the upper 120s.  BUN 21.  Creatinine 1.3, baseline 1.0-1.1.  UA not suggestive of infection.  Urine culture no growth.   02/18/19: Seen and examined at her bedside.  Reports urine screen is improved from yesterday after starting bethanechol but continues to have feeling of not completely emptying her bladder.  We will consult urology.   Assessment/Plan: Principal Problem:   Urinary retention Active Problems:   Hyponatremia   Hypertension   Lumbosacral radiculopathy   Constipation  Urinary retention post back surgery Continue bethanechol for now we will started on 02/17/2019 We will consult urology Urine output as of the last 24 hours recorded as 3100 cc Net I&O since admission -8.0 L Continue to monitor urine output Status post emergent back surgery on 01/29/2019 Repeat MRI reveals residual/recurrent L4-5 central disc extrusion with superior migration resulting in moderate to severe spinal canal stenosis. Progressed disc bulge at L3-4 level with moderate spinal canal stenosis. Multilevel mild and moderate neural foraminal stenosis. Stable severe right L5-S1 foraminal stenosis. Evaluated by neurosurgery who recommended decadron for postoperative edema and unclear if surgical decompression would improve situation.  Bladder scan as needed  Acute on chronic euvolemic hyponatremia Her baseline sodium appears to be 127 Sodium  dropped this morning 123 from 125 Urine lites are pending Serum osmolality 260 Continue fluid restriction Started on salt tablet 1g BID with meals. Repeat BMP tonight and in the AM.  Chronic constipation Continue bowel regimen No evidence of bowel obstruction on CT  Chronic diastolic CHF noted Last 2D echo revealed EF 55 to 60% in 2019 I's and O's and daily weight No acute issues  Chronic normocytic anemia Hg 8.0. anemia panel within limits of normal. No s/sx active bleeding.  Iron deficiency from iron studies done on 02/15/2019 Start ferrous sulfate 325 daily  COPD -Stable. No shortness of breath or wheezing. DuoNebs PRN.  Hypertension.  Blood pressure is normotensive.  Continue home meds include losartan, diltazem and apresoline and aldactone.   Physical debility/ambulatory dysfunction PT recommended home health PT    Code Status: full Family Communication: We will call if okay with the patient. Disposition Plan:  Possible discharge tomorrow when urinary retention and sodium level improved and urology signs off..  Consultants:  Dr elsner     Objective: Vitals:   02/17/19 1720 02/17/19 1949 02/17/19 2350 02/18/19 0347  BP: (!) 151/54 (!) 161/62 (!) 152/66 (!) 136/59  Pulse: 92 90 78 67  Resp:  20 18   Temp: 98.2 F (36.8 C) 99 F (37.2 C) 97.7 F (36.5 C) 97.6 F (36.4 C)  TempSrc: Oral Oral Oral Oral  SpO2: 99% 100% 99% 100%  Weight:      Height:        Intake/Output Summary (Last 24 hours) at 02/18/2019 1050 Last data filed at 02/18/2019 0610 Gross per 24 hour  Intake 1600 ml  Output 3154 ml  Net -1554 ml   Filed Weights   02/14/19 1940 02/14/19 1948  Weight: 47.6 kg 47.6  kg    Exam:  . General: 73 y.o. year-old female well-developed well-nourished in no acute distress.  Alert and oriented x3.   . Cardiovascular: Regular rate and rhythm with no rubs or gallops.  No JVD or thyromegaly noted. Marland Kitchen Respiratory: Clear to auscultation with  no wheezes or rales.  Good inspiratory effort. . Abdomen: Soft nontender nondistended with normal bowel sounds x4.  Musculoskeletal: No lower extremity edema.  2 out of 4 pulses in all 4 extremities. Marland Kitchen Psychiatry: Mood is appropriate for condition and setting.   Data Reviewed: CBC: Recent Labs  Lab 02/14/19 2059 02/15/19 0609 02/15/19 1143 02/16/19 0301  WBC 9.8 6.6 5.8 5.8  NEUTROABS 8.5*  --   --   --   HGB 8.6* 7.6* 7.9* 8.0*  HCT 24.2* 21.2* 22.2* 22.1*  MCV 101.3* 100.0 99.6 100.0  PLT 221 191 190 643   Basic Metabolic Panel: Recent Labs  Lab 02/15/19 0609 02/15/19 1143 02/16/19 0301 02/17/19 0729 02/18/19 0712  NA 125* 125* 125* 125* 123*  K 4.0 3.8 4.1 3.9 4.0  CL 99 97* 98 98 96*  CO2 21* 19* 20* 17* 19*  GLUCOSE 95 90 101* 189* 105*  BUN 14 12 16 18 23   CREATININE 1.05* 1.07* 1.19* 1.26* 1.00  CALCIUM 8.0* 7.9* 7.9* 8.5* 8.5*   GFR: Estimated Creatinine Clearance: 37.7 mL/min (by C-G formula based on SCr of 1 mg/dL). Liver Function Tests: No results for input(s): AST, ALT, ALKPHOS, BILITOT, PROT, ALBUMIN in the last 168 hours. No results for input(s): LIPASE, AMYLASE in the last 168 hours. No results for input(s): AMMONIA in the last 168 hours. Coagulation Profile: No results for input(s): INR, PROTIME in the last 168 hours. Cardiac Enzymes: No results for input(s): CKTOTAL, CKMB, CKMBINDEX, TROPONINI in the last 168 hours. BNP (last 3 results) No results for input(s): PROBNP in the last 8760 hours. HbA1C: No results for input(s): HGBA1C in the last 72 hours. CBG: No results for input(s): GLUCAP in the last 168 hours. Lipid Profile: No results for input(s): CHOL, HDL, LDLCALC, TRIG, CHOLHDL, LDLDIRECT in the last 72 hours. Thyroid Function Tests: No results for input(s): TSH, T4TOTAL, FREET4, T3FREE, THYROIDAB in the last 72 hours. Anemia Panel: No results for input(s): VITAMINB12, FOLATE, FERRITIN, TIBC, IRON, RETICCTPCT in the last 72 hours. Urine  analysis:    Component Value Date/Time   COLORURINE YELLOW 02/14/2019 2141   APPEARANCEUR CLEAR 02/14/2019 2141   LABSPEC 1.008 02/14/2019 2141   PHURINE 7.0 02/14/2019 2141   GLUCOSEU NEGATIVE 02/14/2019 2141   HGBUR NEGATIVE 02/14/2019 2141   BILIRUBINUR NEGATIVE 02/14/2019 2141   [place] NEGATIVE 02/14/2019 2141   PROTEINUR 30 (A) 02/14/2019 2141   UROBILINOGEN 0.2 09/22/2012 1100   NITRITE NEGATIVE 02/14/2019 2141   LEUKOCYTESUR NEGATIVE 02/14/2019 2141   Sepsis Labs: @LABRCNTIP (procalcitonin:4,lacticidven:4)  ) Recent Results (from the past 240 hour(s))  Urine culture     Status: None   Collection Time: 02/14/19  8:33 PM   Specimen: Urine, Random  Result Value Ref Range Status   Specimen Description URINE, RANDOM  Final   Special Requests NONE  Final   Culture   Final    NO GROWTH Performed at [facility], [place] [address], [place], [place] [address]    Report Status 02/16/2019 FINAL  Final  SARS Coronavirus 2     Status: None   Collection Time: 02/15/19 12:24 AM  Result Value Ref Range Status   SARS Coronavirus 2 NOT DETECTED NOT  DETECTED Final    Comment: (NOTE) SARS-CoV-2 target nucleic acids are NOT DETECTED. The SARS-CoV-2 RNA is generally detectable in upper and lower respiratory specimens during the acute phase of infection.  Negative  results do not preclude SARS-CoV-2 infection, do not rule out co-infections with other pathogens, and should not be used as the sole basis for treatment or other patient management decisions.  Negative results must be combined with clinical observations, patient history, and epidemiological information. The expected result is Not Detected. Fact Sheet for Patients: http://www.biofiredefense.com/wp-content/uploads/2020/03/BIOFIRE-COVID -19-patients.pdf Fact Sheet for Healthcare Providers: http://www.biofiredefense.com/wp-content/uploads/2020/03/BIOFIRE-COVID -19-hcp.pdf This test is not yet approved or cleared by the  [place] and  has been authorized for detection and/or diagnosis of SARS-CoV-2 by FDA under an Emergency Use Authorization (EUA).  This EUA will remain in effec t (meaning this test can be used) for the duration of  the COVID-19 declaration under Section 564(b)(1) of the Act, 21 U.S.C. section 360bbb-3(b)(1), unless the authorization is terminated or revoked sooner. Performed at [facility], [place] [address] [place], [place] [address]       Studies: No results found.  Scheduled Meds: . bethanechol  5 mg Oral TID  . busPIRone  30 mg Oral BID  . diltiazem  360 mg Oral Daily  . feeding supplement  1 Container Oral TID BM  . hydrALAZINE  50 mg Oral Q8H  . losartan  100 mg Oral Daily  . predniSONE  20 mg Oral BID WC  . senna-docusate  1 tablet Oral BID  . spironolactone  12.5 mg Oral Daily    Continuous Infusions:   LOS: 3 days     Kayleen Memos, MD [facility] Pager [phone]  If 7PM-7AM, please contact night-coverage [email or web address] Password [email or web address] 02/18/2019, 10:50 AM

## 2019-02-18 NOTE — Progress Notes (Addendum)
Patient ID: Regina Ortiz, female   DOB: 06-Oct-1945, 73 y.o.   MRN: [id number] Vital signs have been stable.  Patient has been started on Urecholine.  Voiding issues remain primary problem I do not believe that further surgical decompression or lumbar spine is likely to yield any change or improvement in this condition.  I would strongly advocate for urology consultation.  I will return on Monday for further follow-up.

## 2019-02-18 NOTE — Consult Note (Signed)
   [facility] CM Inpatient Consult   02/18/2019  Regina Ortiz November 16, 1945 [id number]    Patient reviewed for readmission within 30 days, with 2 hospitalizations and 2 ED visits in the past 6 months; and to check if potential [place] care management services needed bythis [employer] memberwitha 23% high risk for unplanned readmissions.  Patient'sprimary care provider is Dr. Maurice Small  With [facility] [place], listed as providing transition of care follow-up.  Note: Patient will be followed by an external care management group ([facility]) post hospitalization to follow-up needs and for continued case management services.   For additional questions or referrals, please contact:  Edwena Felty A. Damont Balles, BSN, RN-BC [facility] Liaison Cell: [phone]

## 2019-02-18 NOTE — Consult Note (Signed)
Urology Consult  Referring physician: Dr. Nevada Crane Reason for referral: Urinary retention  Chief Complaint: Weak urinary stream  History of Present Illness: Regina Ortiz is a 73yo with a history of L4/L5 disc herniation and cauda equina syndrome who underwent decompression on 5/29. she was having difficulty initiating her urinary stream when she developed severe back pain. After surgery she developed an inability to urinate and was straining to initiate her urinary stream. She has been able to urinate 100-200cc but leaves a residual of 300-700cc. She denies any dysuria or hematuria. She denies any urinary urgency. She was tried on bethanecol which failed to improve her urinary retention. She is also having issue with constipation. No other associated symptoms. No exacerbating/alleviaitng events   Past Medical History:  Diagnosis Date  . AIN grade I   . Anxiety   . Bruises easily   . Chronic diarrhea   . Chronic hyponatremia   . Colitis   . COPD (chronic obstructive pulmonary disease) ([place])   . Depression   . GERD (gastroesophageal reflux disease)   . History of chronic bronchitis   . History of vulvar dysplasia    serveral recurrency's   . Hypertension   . LBBB (left bundle branch block) W/ PROLONGED PR   CARDIOLOGSIT-  DR Johnsie Cancel-   LOV IN EPIC  . Macrocytosis   . Nocturia   . Osteoporosis   . Seronegative rheumatoid arthritis ([place])   . Short of breath on exertion   . Smokers' cough ([place])   . Venous insufficiency of leg    , Edema  . VIN III (vulvar intraepithelial neoplasia III)    RECURRENT -   Past Surgical History:  Procedure Laterality Date  . APPENDECTOMY  1992  . CARDIOVASCULAR STRESS TEST  06-23-2012  DR Jenkins [place]   NORMAL LEXISCAN MYOVIEW/ EF 83%/  NO ISCHEMIA  . CO2 LASER APPLICATION  67/89/3810   Procedure: CO2 LASER APPLICATION;  Surgeon: Janie Morning, MD PHD;  Location: [facility];  Service: Gynecology;  Laterality: N/A;  Laser Vaporization  . CO2  LASER APPLICATION N/A 09/07/5100   Procedure: CO2 LASER APPLICATION;  Surgeon: [name], MD;  Location[facility];  Service: Gynecology;  Laterality: N/A;  . CO2 LASER APPLICATION N/A 5/85/2778   Procedure: CO2 LASER APPLICATION of the vulva;  Surgeon: Janie Morning, MD;  Location[facility];  Service: Gynecology;  Laterality: N/A;  . EXCISION VULVAR LESIONS  07/2012  . HEMORRHOID SURGERY N/A 04/15/2013   Procedure: Excision of external and internal  hemorrhoid with AIN, Exam under anesthesia;  Surgeon: Odis Hollingshead, MD;  Location: [facility];  Service: General;  Laterality: N/A;  . REPAIR FISTULA IN ANO/  I & D PERIRECTAL ABSCESS  10-26-2002  DR Johnathan Hausen  . TRANSTHORACIC ECHOCARDIOGRAM  06-23-2012   NORMAL LVSF/ EF 55-60%  . VAGINAL HYSTERECTOMY  1997   partial  . VULVAR LESION REMOVAL N/A 11/03/2014   Procedure: CO2 LASER OF VULVAR ;  Surgeon: [name], MD;  Location: [facility];  Service: Gynecology;  Laterality: N/A;  . VULVECTOMY N/A 03/26/2015   Procedure: WIDE LOCA  EXCISION OF VULVAR;  Surgeon: [name], MD;  Location: [facility];  Service: Gynecology;  Laterality: N/A;  . Wide Local Excision of labia majora  04-04-2010   Right-sided lesion, CO2 ablation of right labia minora    Medications: I have reviewed the patient's current medications. Allergies:  Allergies  Allergen Reactions  .  Latex Swelling and Other (See Comments)     fever blisters  . Augmentin [Amoxicillin-Pot Clavulanate] Nausea And Vomiting  . Doxycycline Other (See Comments)    Blurred vision, nervous, unsteady  . Lactose Intolerance (Gi) Other (See Comments)    Unknown, per pt  . Lialda [Mesalamine] Diarrhea  . Prinivil [Lisinopril] Diarrhea  . Triamcinolone Acetonide Other (See Comments)    REDNESS AND PAIN  . Amlodipine Rash  . Lotensin [Benazepril Hcl] Rash    Family History  Problem Relation Age of Onset  . Diabetes Mother    . Hypertension Father   . Lymphoma Son   . Cancer Son        lymphoblastic lymphoma   Social History:  reports that she has been smoking cigarettes. She has a 12.50 pack-year smoking history. She has never used smokeless tobacco. She reports current alcohol use of about 2.0 standard drinks of alcohol per week. She reports that she does not use drugs.  Review of Systems  Gastrointestinal: Positive for abdominal pain.  Genitourinary: Positive for frequency.  All other systems reviewed and are negative.   Physical Exam:  Vital signs in last 24 hours: Temp:  [97.6 F (36.4 C)-98.5 F (36.9 C)] 98.5 F (36.9 C) (06/19 1955) Pulse Rate:  [67-78] 67 (06/19 0347) Resp:  [18] 18 (06/18 2350) BP: (136-155)/(59-66) 155/61 (06/19 1955) SpO2:  [99 %-100 %] 100 % (06/19 0347) Physical Exam  Constitutional: She is oriented to person, place, and time. She appears well-developed and well-nourished.  HENT:  Head: Normocephalic and atraumatic.  Eyes: Pupils are equal, round, and reactive to light. EOM are normal.  Neck: Normal range of motion. No thyromegaly present.  Cardiovascular: Normal rate and regular rhythm.  Respiratory: Effort normal. No respiratory distress.  GI: Soft. She exhibits no distension. There is no abdominal tenderness.  Musculoskeletal: Normal range of motion.        General: No edema.  Neurological: She is alert and oriented to person, place, and time.  Skin: Skin is warm and dry.  Psychiatric: She has a normal mood and affect. Her behavior is normal. Judgment and thought content normal.    Laboratory Data:  Results for orders placed or performed during the hospital encounter of 02/14/19 (from the past 72 hour(s))  CBC     Status: Abnormal   Collection Time: 02/16/19  3:01 AM  Result Value Ref Range   WBC 5.8 4.0 - 10.5 K/uL   RBC 2.21 (L) 3.87 - 5.11 MIL/uL   Hemoglobin 8.0 (L) 12.0 - 15.0 g/dL   HCT 22.1 (L) 36.0 - 46.0 %   MCV 100.0 80.0 - 100.0 fL   MCH 36.2  (H) 26.0 - 34.0 pg   MCHC 36.2 (H) 30.0 - 36.0 g/dL   RDW 14.1 11.5 - 15.5 %   Platelets 188 150 - 400 K/uL   nRBC 0.0 0.0 - 0.2 %    Comment: Performed at [facility], [place] [address] [place], [place] [address]  Basic metabolic panel     Status: Abnormal   Collection Time: 02/16/19  3:01 AM  Result Value Ref Range   Sodium 125 (L) 135 - 145 mmol/L   Potassium 4.1 3.5 - 5.1 mmol/L   Chloride 98 98 - 111 mmol/L   CO2 20 (L) 22 - 32 mmol/L   Glucose, Bld 101 (H) 70 - 99 mg/dL   BUN 16 8 - 23 mg/dL   Creatinine, Ser 1.19 (H) 0.44 - 1.00 mg/dL  Calcium 7.9 (L) 8.9 - 10.3 mg/dL   GFR calc non Af Amer 45 (L) >60 mL/min   GFR calc Af Amer 52 (L) >60 mL/min   Anion gap 7 5 - 15    Comment: Performed at [facility] [address] [place], [place] [address]  Basic metabolic panel     Status: Abnormal   Collection Time: 02/17/19  7:29 AM  Result Value Ref Range   Sodium 125 (L) 135 - 145 mmol/L   Potassium 3.9 3.5 - 5.1 mmol/L   Chloride 98 98 - 111 mmol/L   CO2 17 (L) 22 - 32 mmol/L   Glucose, Bld 189 (H) 70 - 99 mg/dL   BUN 18 8 - 23 mg/dL   Creatinine, Ser 1.26 (H) 0.44 - 1.00 mg/dL   Calcium 8.5 (L) 8.9 - 10.3 mg/dL   GFR calc non Af Amer 42 (L) >60 mL/min   GFR calc Af Amer 49 (L) >60 mL/min   Anion gap 10 5 - 15    Comment: Performed at [facility] [address], [place], [place] [address]  Uric acid     Status: None   Collection Time: 02/18/19  7:12 AM  Result Value Ref Range   Uric Acid, Serum 3.2 2.5 - 7.1 mg/dL    Comment: Performed at [facility] [address], [place], [place] [address]  Osmolality     Status: Abnormal   Collection Time: 02/18/19  7:12 AM  Result Value Ref Range   Osmolality 260 (L) 275 - 295 mOsm/kg    Comment: Performed at [facility], [place] [address], [place], [place] [address]  Basic metabolic panel     Status: Abnormal   Collection Time: 02/18/19  7:12 AM  Result Value Ref Range   Sodium 123 (L) 135 - 145  mmol/L   Potassium 4.0 3.5 - 5.1 mmol/L   Chloride 96 (L) 98 - 111 mmol/L   CO2 19 (L) 22 - 32 mmol/L   Glucose, Bld 105 (H) 70 - 99 mg/dL   BUN 23 8 - 23 mg/dL   Creatinine, Ser 1.00 0.44 - 1.00 mg/dL   Calcium 8.5 (L) 8.9 - 10.3 mg/dL   GFR calc non Af Amer 56 (L) >60 mL/min   GFR calc Af Amer >60 >60 mL/min   Anion gap 8 5 - 15    Comment: Performed at [facility] [address], [place], [place] [address]  Basic metabolic panel     Status: Abnormal   Collection Time: 02/18/19  8:50 PM  Result Value Ref Range   Sodium 121 (L) 135 - 145 mmol/L   Potassium 4.5 3.5 - 5.1 mmol/L   Chloride 92 (L) 98 - 111 mmol/L   CO2 21 (L) 22 - 32 mmol/L   Glucose, Bld 118 (H) 70 - 99 mg/dL   BUN 30 (H) 8 - 23 mg/dL   Creatinine, Ser 1.32 (H) 0.44 - 1.00 mg/dL   Calcium 9.2 8.9 - 10.3 mg/dL   GFR calc non Af Amer 40 (L) >60 mL/min   GFR calc Af Amer 46 (L) >60 mL/min   Anion gap 8 5 - 15    Comment: Performed at [facility] [address] [place], [place] [address]   Recent Results (from the past 240 hour(s))  Urine culture     Status: None   Collection Time: 02/14/19  8:33 PM   Specimen: Urine,  Random  Result Value Ref Range Status   Specimen Description URINE, RANDOM  Final   Special Requests NONE  Final   Culture   Final    NO GROWTH Performed at [facility], [address] [place], [place] [address]    Report Status 02/16/2019 FINAL  Final  SARS Coronavirus 2     Status: None   Collection Time: 02/15/19 12:24 AM  Result Value Ref Range Status   SARS Coronavirus 2 NOT DETECTED NOT DETECTED Final    Comment: (NOTE) SARS-CoV-2 target nucleic acids are NOT DETECTED. The SARS-CoV-2 RNA is generally detectable in upper and lower respiratory specimens during the acute phase of infection.  Negative  results do not preclude SARS-CoV-2 infection, do not rule out co-infections with other pathogens, and should not be used as the sole basis for treatment or other  patient management decisions.  Negative results must be combined with clinical observations, patient history, and epidemiological information. The expected result is Not Detected. Fact Sheet for Patients: http://www.biofiredefense.com/wp-content/uploads/2020/03/BIOFIRE-COVID -19-patients.pdf Fact Sheet for Healthcare Providers: http://www.biofiredefense.com/wp-content/uploads/2020/03/BIOFIRE-COVID -19-hcp.pdf This test is not yet approved or cleared by the [place] and  has been authorized for detection and/or diagnosis of SARS-CoV-2 by FDA under an Emergency Use Authorization (EUA).  This EUA will remain in effec t (meaning this test can be used) for the duration of  the COVID-19 declaration under Section 564(b)(1) of the Act, 21 U.S.C. section 360bbb-3(b)(1), unless the authorization is terminated or revoked sooner. Performed at [facility], [place] [address] [place], [place] [address]    Creatinine: Recent Labs    02/14/19 2059 02/15/19 0609 02/15/19 1143 02/16/19 0301 02/17/19 0729 02/18/19 0712 02/18/19 2050  CREATININE 1.35* 1.05* 1.07* 1.19* 1.26* 1.00 1.32*   Baseline Creatinine: 1  Impression/Assessment:  72yo with incomplete bladder emptying after L4/L5 decompression  Plan:  I discussed the various causes of urinary retention with the patient and her retention is likely related to cauda equina syndrome versus her recent spine surgery. We discussed the management including CIC versus indwelling foley and the patient wishes to proceed with indwelling foley. She should followup in 4 weeks for a voiding trial.    Nicolette Bang 02/18/2019, 10:20 PM

## 2019-02-18 NOTE — Progress Notes (Signed)
Physical Therapy Treatment Patient Details Name: Regina Ortiz MRN: [id number] DOB: Jan 11, 1946 Today's Date: 02/18/2019    History of Present Illness 73 y.o. female admitted on 02/14/19 for urinary retention, perineal hypoesthesia, lumbosacral radiculopathy, hyponatremia, constipation.  Pt with significant PMH of SOB with exertion, LBBB, HTN, COPD, anxiety, L4/5 decompression and fusion on 01/28/19 with d/c to [facility] x 8 days, then home.      PT Comments    Pt reporting worse back pain and L leg radicular pain today, and very concerned about her urinary retention.  She was able to walk a good distance into the hallway holding therapist's hand and at times (especially on the way back) reaching for hallway railing for support as well.  She did not want to walk with RW, but if she feels like this next session, I would encourage it vs cane if she feels better.  PT will continue to follow acutely for safe mobility progression   Follow Up Recommendations  Home health PT;Supervision - Intermittent     Equipment Recommendations  None recommended by PT    Recommendations for Other Services   NA     Precautions / Restrictions Precautions Precautions: Back Required Braces or Orthoses: Spinal Brace Spinal Brace: Lumbar corset;Applied in sitting position(left it at home)    Mobility  Bed Mobility Overal bed mobility: Needs Assistance Bed Mobility: Rolling;Sidelying to Sit Rolling: Supervision Sidelying to sit: Supervision     Sit to sidelying: Supervision General bed mobility comments: Cues for log roll technique especially when returning to bed.   Transfers Overall transfer level: Needs assistance Equipment used: 1 person hand held assist Transfers: Sit to/from Stand Sit to Stand: Min guard         General transfer comment: Min guard assist for safety  Ambulation/Gait Ambulation/Gait assistance: Min guard Gait Distance (Feet): 80 Feet Assistive device: 1 person hand  held assist Gait Pattern/deviations: Step-through pattern;Decreased weight shift to left Gait velocity: decreased Gait velocity interpretation: 1.31 - 2.62 ft/sec, indicative of limited community ambulator General Gait Details: Pt holding PT's hand and reaching at times for hallway railing.  She did not want to use RW with me today despite reporting increased pain today compared to last session.         Balance Overall balance assessment: Needs assistance Sitting-balance support: Feet supported;No upper extremity supported Sitting balance-Leahy Scale: Good     Standing balance support: Bilateral upper extremity supported;Single extremity supported Standing balance-Leahy Scale: Fair                              Cognition Arousal/Alertness: Awake/alert Behavior During Therapy: Flat affect Overall Cognitive Status: Within Functional Limits for tasks assessed                                 General Comments: Not specifically tested         General Comments General comments (skin integrity, edema, etc.): Pt returned to bed as RN needed to do bladder scan as pt continues to retain urine and need I and O cath.       Pertinent Vitals/Pain Pain Assessment: Faces Faces Pain Scale: Hurts even more Pain Location: back and down L leg Pain Descriptors / Indicators: Radiating Pain Intervention(s): Limited activity within patient's tolerance;Monitored during session;Repositioned           PT Goals (current goals can  now be found in the care plan section) Acute Rehab PT Goals Patient Stated Goal: feel better, get her bladder issues better, and less pain in back and L leg. Progress towards PT goals: Progressing toward goals    Frequency    Min 3X/week      PT Plan Current plan remains appropriate       AM-PAC PT "6 Clicks" Mobility   Outcome Measure  Help needed turning from your back to your side while in a flat bed without using bedrails?: A  Little Help needed moving from lying on your back to sitting on the side of a flat bed without using bedrails?: A Little Help needed moving to and from a bed to a chair (including a wheelchair)?: A Little Help needed standing up from a chair using your arms (e.g., wheelchair or bedside chair)?: A Little Help needed to walk in hospital room?: A Little Help needed climbing 3-5 steps with a railing? : A Little 6 Click Score: 18    End of Session   Activity Tolerance: Patient limited by pain;Patient limited by fatigue Patient left: in bed;with call bell/phone within reach Nurse Communication: Mobility status PT Visit Diagnosis: Difficulty in walking, not elsewhere classified (R26.2) Pain - Right/Left: Left Pain - part of body: Leg     Time: 2897-9150 PT Time Calculation (min) (ACUTE ONLY): 10 min  Charges:  $Gait Training: 8-22 mins          Vandana Haman B. Davyd Podgorski, PT, DPT  Acute Rehabilitation [phone] pager [phone] office  @ [facility]: [phone]            02/18/2019, 6:36 PM

## 2019-02-19 LAB — OSMOLALITY, URINE: Osmolality, Ur: 198 mOsm/kg — ABNORMAL LOW (ref 300–900)

## 2019-02-19 LAB — BASIC METABOLIC PANEL
Anion gap: 9 (ref 5–15)
Anion gap: 8 (ref 5–15)
BUN: 25 mg/dL — ABNORMAL HIGH (ref 8–23)
BUN: 27 mg/dL — ABNORMAL HIGH (ref 8–23)
CO2: 21 mmol/L — ABNORMAL LOW (ref 22–32)
CO2: 20 mmol/L — ABNORMAL LOW (ref 22–32)
Calcium: 9.1 mg/dL (ref 8.9–10.3)
Calcium: 8.7 mg/dL — ABNORMAL LOW (ref 8.9–10.3)
Chloride: 95 mmol/L — ABNORMAL LOW (ref 98–111)
Chloride: 95 mmol/L — ABNORMAL LOW (ref 98–111)
Creatinine, Ser: 1.05 mg/dL — ABNORMAL HIGH (ref 0.44–1.00)
Creatinine, Ser: 1.37 mg/dL — ABNORMAL HIGH (ref 0.44–1.00)
GFR calc Af Amer: >60 mL/min (ref >60)
GFR calc Af Amer: 44 mL/min — ABNORMAL LOW (ref >60)
GFR calc non Af Amer: 53 mL/min — ABNORMAL LOW (ref >60)
GFR calc non Af Amer: 38 mL/min — ABNORMAL LOW (ref >60)
Glucose, Bld: 106 mg/dL — ABNORMAL HIGH (ref 70–99)
Glucose, Bld: 152 mg/dL — ABNORMAL HIGH (ref 70–99)
Potassium: 4.4 mmol/L (ref 3.5–5.1)
Potassium: 4.5 mmol/L (ref 3.5–5.1)
Sodium: 125 mmol/L — ABNORMAL LOW (ref 135–145)
Sodium: 123 mmol/L — ABNORMAL LOW (ref 135–145)

## 2019-02-19 LAB — SODIUM, URINE, RANDOM: Sodium, Ur: 42 mmol/L

## 2019-02-19 LAB — TSH: TSH: 0.944 u[IU]/mL (ref 0.350–4.500)

## 2019-02-19 LAB — CORTISOL: Cortisol, Plasma: 4.4 ug/dL

## 2019-02-19 LAB — BRAIN NATRIURETIC PEPTIDE: B Natriuretic Peptide: 238 pg/mL — ABNORMAL HIGH (ref 0.0–100.0)

## 2019-02-19 MED ORDER — SODIUM CHLORIDE 0.9 % IV SOLN
INTRAVENOUS | Status: AC
  Administered 2019-02-19: 09:00:00 via INTRAVENOUS

## 2019-02-19 NOTE — Progress Notes (Signed)
PROGRESS NOTE    JACKELYN ILLINGWORTH  VZD:[id number] DOB: [birth year] DOA: 02/14/2019 PCP: Maurice Small, MD   Brief Narrative:  73 year old with history of herniated disc L4/L5 with cauda equina syndrome status post decompression 01/28/2019, chronic hyponatremia, GERD, essential hypertension, rheumatoid arthritis, chronic venous insufficiency came to the hospital for evaluation of urinary retention.  Concern for possible postop related, urology suggest continue indwelling Foley with outpatient follow-up in 4 weeks.  Noted to have hyponatremia.   Assessment & Plan:   Principal Problem:   Urinary retention Active Problems:   Hyponatremia   Hypertension   Lumbosacral radiculopathy   Constipation  Acute on chronic hyponatremia -Baseline sodium appears to be 127.  It is down trended this morning -On salt tabs 1 g twice daily with meals - Stop fluid restriction, gentle hydration.  Repeat BMP at 3 PM. -Check BNP, TSH and cortisol levels  Urinary retention with recent lumbar surgery - Case was discussed by previous provider with neurosurgery who does not think this is secondary to her surgery.  Seen by urology who recommends Foley catheter placement and outpatient follow-up in 4 weeks.  Continue to provide supportive care.  Pain control.  Generalized weakness with lower extremity numbness and tingling - Lower extremity numbness and tingling was even present prior to her recent lumbar spinal stenosis surgery. -PT/OT daily.  PT recommending home health with supervision at this time  Anemia of chronic disease -Stable hemoglobin at 8.0  Chronic diastolic congestive heart failure with preserved ejection fraction, a 55% -Euvolemic  COPD - Stable.  Essential hypertension -Cardizem, hydralazine, losartan  DVT prophylaxis:  Code Status: Full code Family Communication: None at bedside Disposition Plan: Maintain hospital stay until sodium levels have improved  Consultants:   Urology   Neurosurgery  Procedures:   None  Antimicrobials:   None   Subjective: Patient tells me she feels extremely weak this morning and has dry mouth.  She was able to ambulate with the assistance of physical therapy this morning.  Review of Systems Otherwise negative except as per HPI, including: General: Denies fever, chills, night sweats or unintended weight loss. Resp: Denies cough, wheezing, shortness of breath. Cardiac: Denies chest pain, palpitations, orthopnea, paroxysmal nocturnal dyspnea. GI: Denies abdominal pain, nausea, vomiting, diarrhea or constipation GU: Denies dysuria, frequency, hesitancy or incontinence MS: Denies muscle aches, joint pain or swelling Neuro: Denies headache, neurologic deficits (focal weakness, numbness, tingling), abnormal gait Psych: Denies anxiety, depression, SI/HI/AVH Skin: Denies new rashes or lesions ID: Denies sick contacts, exotic exposures, travel  Objective: Vitals:   02/18/19 1955 02/19/19 0011 02/19/19 0013 02/19/19 0345  BP: (!) 155/61 (!) 174/67 (!) 168/66 (!) 148/71  Pulse:  80  82  Resp:  16  17  Temp: 98.5 F (36.9 C) 97.6 F (36.4 C)  97.7 F (36.5 C)  TempSrc: Oral Oral  Oral  SpO2:    98%  Weight:      Height:        Intake/Output Summary (Last 24 hours) at 02/19/2019 0832 Last data filed at 02/19/2019 0347 Gross per 24 hour  Intake 1160 ml  Output 3225 ml  Net -2065 ml   Filed Weights   02/14/19 1940 02/14/19 1948  Weight: 47.6 kg 47.6 kg    Examination:  General exam: Appears calm and comfortable, dry mouth Respiratory system: Clear to auscultation. Respiratory effort normal. Cardiovascular system: S1 & S2 heard, RRR. No JVD, murmurs, rubs, gallops or clicks. No pedal edema. Gastrointestinal system: Abdomen is nondistended, soft  and nontender. No organomegaly or masses felt. Normal bowel sounds heard. Central nervous system: Alert and oriented. No focal neurological deficits. Extremities: Symmetric 4 x 5  power. Skin: No rashes, lesions or ulcers Psychiatry: Judgement and insight appear normal. Mood & affect appropriate.     Data Reviewed:   CBC: Recent Labs  Lab 02/14/19 2059 02/15/19 0609 02/15/19 1143 02/16/19 0301  WBC 9.8 6.6 5.8 5.8  NEUTROABS 8.5*  --   --   --   HGB 8.6* 7.6* 7.9* 8.0*  HCT 24.2* 21.2* 22.2* 22.1*  MCV 101.3* 100.0 99.6 100.0  PLT 221 191 190 425   Basic Metabolic Panel: Recent Labs  Lab 02/15/19 1143 02/16/19 0301 02/17/19 0729 02/18/19 0712 02/18/19 2050  NA 125* 125* 125* 123* 121*  K 3.8 4.1 3.9 4.0 4.5  CL 97* 98 98 96* 92*  CO2 19* 20* 17* 19* 21*  GLUCOSE 90 101* 189* 105* 118*  BUN 12 16 18 23  30*  CREATININE 1.07* 1.19* 1.26* 1.00 1.32*  CALCIUM 7.9* 7.9* 8.5* 8.5* 9.2   GFR: Estimated Creatinine Clearance: 28.5 mL/min (A) (by C-G formula based on SCr of 1.32 mg/dL (H)). Liver Function Tests: No results for input(s): AST, ALT, ALKPHOS, BILITOT, PROT, ALBUMIN in the last 168 hours. No results for input(s): LIPASE, AMYLASE in the last 168 hours. No results for input(s): AMMONIA in the last 168 hours. Coagulation Profile: No results for input(s): INR, PROTIME in the last 168 hours. Cardiac Enzymes: No results for input(s): CKTOTAL, CKMB, CKMBINDEX, TROPONINI in the last 168 hours. BNP (last 3 results) No results for input(s): PROBNP in the last 8760 hours. HbA1C: No results for input(s): HGBA1C in the last 72 hours. CBG: No results for input(s): GLUCAP in the last 168 hours. Lipid Profile: No results for input(s): CHOL, HDL, LDLCALC, TRIG, CHOLHDL, LDLDIRECT in the last 72 hours. Thyroid Function Tests: No results for input(s): TSH, T4TOTAL, FREET4, T3FREE, THYROIDAB in the last 72 hours. Anemia Panel: No results for input(s): VITAMINB12, FOLATE, FERRITIN, TIBC, IRON, RETICCTPCT in the last 72 hours. Sepsis Labs: No results for input(s): PROCALCITON, LATICACIDVEN in the last 168 hours.  Recent Results (from the past 240  hour(s))  Urine culture     Status: None   Collection Time: 02/14/19  8:33 PM   Specimen: Urine, Random  Result Value Ref Range Status   Specimen Description URINE, RANDOM  Final   Special Requests NONE  Final   Culture   Final    NO GROWTH Performed at [facility], [address], [place], [place] [address]    Report Status 02/16/2019 FINAL  Final  SARS Coronavirus 2     Status: None   Collection Time: 02/15/19 12:24 AM  Result Value Ref Range Status   SARS Coronavirus 2 NOT DETECTED NOT DETECTED Final    Comment: (NOTE) SARS-CoV-2 target nucleic acids are NOT DETECTED. The SARS-CoV-2 RNA is generally detectable in upper and lower respiratory specimens during the acute phase of infection.  Negative  results do not preclude SARS-CoV-2 infection, do not rule out co-infections with other pathogens, and should not be used as the sole basis for treatment or other patient management decisions.  Negative results must be combined with clinical observations, patient history, and epidemiological information. The expected result is Not Detected. Fact Sheet for Patients: http://www.biofiredefense.com/wp-content/uploads/2020/03/BIOFIRE-COVID -19-patients.pdf Fact Sheet for Healthcare Providers: http://www.biofiredefense.com/wp-content/uploads/2020/03/BIOFIRE-COVID -19-hcp.pdf This test is not yet approved or cleared by the [place] and  has been authorized  for detection and/or diagnosis of SARS-CoV-2 by FDA under an Emergency Use Authorization (EUA).  This EUA will remain in effec t (meaning this test can be used) for the duration of  the COVID-19 declaration under Section 564(b)(1) of the Act, 21 U.S.C. section 360bbb-3(b)(1), unless the authorization is terminated or revoked sooner. Performed at [facility], [place] [address], [place], [place] [address]          Radiology Studies: No results found.      Scheduled Meds: . bethanechol  5 mg Oral TID   . busPIRone  30 mg Oral BID  . diltiazem  360 mg Oral Daily  . feeding supplement  1 Container Oral TID BM  . hydrALAZINE  50 mg Oral Q8H  . losartan  100 mg Oral Daily  . predniSONE  20 mg Oral BID WC  . senna-docusate  1 tablet Oral BID  . sodium chloride  1 g Oral BID WC  . spironolactone  12.5 mg Oral Daily   Continuous Infusions:   LOS: 4 days   Time spent= 25 mins     Arsenio Loader, MD [facility]  If 7PM-7AM, please contact night-coverage [email or web address] 02/19/2019, 8:32 AM

## 2019-02-19 NOTE — Progress Notes (Signed)
Physical Therapy Treatment Patient Details Name: Regina Ortiz MRN: [id number] DOB: Nov 24, 1945 Today's Date: 02/19/2019    History of Present Illness 72 y.o. female admitted on 02/14/19 for urinary retention, perineal hypoesthesia, lumbosacral radiculopathy, hyponatremia, constipation.  Pt with significant PMH of SOB with exertion, LBBB, HTN, COPD, anxiety, L4/5 decompression and fusion on 01/28/19 with d/c to [facility] SN[facility] x 8 days, then home.      PT Comments    Pt reporting general malaise from low grade fever but agreeable to participate in therapy. Trialed cane, but pt preferring use of walker for external support. Ambulating 60 feet with min guard assist. D/c plan remains appropriate.     Follow Up Recommendations  Home health PT;Supervision - Intermittent     Equipment Recommendations  None recommended by PT    Recommendations for Other Services       Precautions / Restrictions Precautions Precautions: Back Required Braces or Orthoses: Spinal Brace Spinal Brace: Lumbar corset;Applied in sitting position(left it at home) Restrictions Weight Bearing Restrictions: No    Mobility  Bed Mobility Overal bed mobility: Needs Assistance Bed Mobility: Rolling;Sidelying to Sit;Sit to Sidelying Rolling: Supervision Sidelying to sit: Supervision     Sit to sidelying: Supervision General bed mobility comments: Exiting to left side of bed with log roll technique  Transfers Overall transfer level: Needs assistance Equipment used: Straight cane Transfers: Sit to/from Stand Sit to Stand: Min guard         General transfer comment: Min guard assist for safety  Ambulation/Gait Ambulation/Gait assistance: Min guard Gait Distance (Feet): 60 Feet Assistive device: Straight cane;Rolling walker (2 wheeled) Gait Pattern/deviations: Step-through pattern;Trunk flexed;Decreased stride length Gait velocity: decreased   General Gait Details: Cues for looking up. x 30 feet with cane,  x 30 ft with walker. Pt with slower gait speed with cane and reaching out for external support with other hand. Improved speed and stability with use of walker   Stairs             Wheelchair Mobility    Modified Rankin (Stroke Patients Only)       Balance Overall balance assessment: Needs assistance Sitting-balance support: Feet supported;No upper extremity supported Sitting balance-Leahy Scale: Good     Standing balance support: Bilateral upper extremity supported;Single extremity supported Standing balance-Leahy Scale: Fair                              Cognition Arousal/Alertness: Awake/alert Behavior During Therapy: WFL for tasks assessed/performed Overall Cognitive Status: Impaired/Different from baseline                               Problem Solving: Slow processing;Difficulty sequencing;Requires verbal cues;Requires tactile cues        Exercises      General Comments        Pertinent Vitals/Pain Pain Assessment: Faces Faces Pain Scale: Hurts little more Pain Location: IV site Pain Intervention(s): Repositioned    Home Living                      Prior Function            PT Goals (current goals can now be found in the care plan section) Acute Rehab PT Goals Patient Stated Goal: feel better, get her bladder issues better, and less pain in back and L leg. Potential to Achieve Goals: Good  Progress towards PT goals: Progressing toward goals    Frequency    Min 3X/week      PT Plan Current plan remains appropriate    Co-evaluation              AM-PAC PT "6 Clicks" Mobility   Outcome Measure  Help needed turning from your back to your side while in a flat bed without using bedrails?: A Little Help needed moving from lying on your back to sitting on the side of a flat bed without using bedrails?: A Little Help needed moving to and from a bed to a chair (including a wheelchair)?: A Little Help  needed standing up from a chair using your arms (e.g., wheelchair or bedside chair)?: A Little Help needed to walk in hospital room?: A Little Help needed climbing 3-5 steps with a railing? : A Little 6 Click Score: 18    End of Session   Activity Tolerance: Patient tolerated treatment well Patient left: in bed;with call bell/phone within reach Nurse Communication: Mobility status PT Visit Diagnosis: Difficulty in walking, not elsewhere classified (R26.2) Pain - Right/Left: Left Pain - part of body: Leg     Time: 6047-9987 PT Time Calculation (min) (ACUTE ONLY): 21 min  Charges:  $Gait Training: 8-22 mins                     Ellamae Sia, PT, DPT Acute Rehabilitation Services Pager [phone] Office [phone]    Willy Eddy 02/19/2019, 12:40 PM

## 2019-02-20 LAB — BASIC METABOLIC PANEL
Anion gap: 6 (ref 5–15)
BUN: 23 mg/dL (ref 8–23)
CO2: 21 mmol/L — ABNORMAL LOW (ref 22–32)
Calcium: 8.6 mg/dL — ABNORMAL LOW (ref 8.9–10.3)
Chloride: 99 mmol/L (ref 98–111)
Creatinine, Ser: 1 mg/dL (ref 0.44–1.00)
GFR calc Af Amer: >60 mL/min (ref >60)
GFR calc non Af Amer: 56 mL/min — ABNORMAL LOW (ref >60)
Glucose, Bld: 106 mg/dL — ABNORMAL HIGH (ref 70–99)
Potassium: 4.4 mmol/L (ref 3.5–5.1)
Sodium: 126 mmol/L — ABNORMAL LOW (ref 135–145)

## 2019-02-20 LAB — MAGNESIUM: Magnesium: 1.9 mg/dL (ref 1.7–2.4)

## 2019-02-20 MED ORDER — POLYETHYLENE GLYCOL 3350 17 G PO PACK
17.0000 g | PACK | Freq: Every day | ORAL | Status: DC | PRN
  Administered 2019-02-20: 16:00:00 17 g via ORAL
  Filled 2019-02-20: qty 1

## 2019-02-20 MED ORDER — ACETAMINOPHEN 325 MG PO TABS
650.0000 mg | ORAL_TABLET | Freq: Four times a day (QID) | ORAL | Status: DC | PRN
  Administered 2019-02-20 – 2019-02-22 (×4): 650 mg via ORAL
  Filled 2019-02-20 (×3): qty 2

## 2019-02-20 MED ORDER — SODIUM CHLORIDE 0.9 % IV SOLN
INTRAVENOUS | Status: DC
  Administered 2019-02-20: 21:00:00 via INTRAVENOUS

## 2019-02-20 NOTE — Progress Notes (Signed)
PROGRESS NOTE    Regina Ortiz  HWE:[id number] DOB: 01-May-1946 DOA: 02/14/2019 PCP: Maurice Small, MD   Brief Narrative:  73 year old with history of herniated disc L4/L5 with cauda equina syndrome status post decompression 01/28/2019, chronic hyponatremia, GERD, essential hypertension, rheumatoid arthritis, chronic venous insufficiency came to the hospital for evaluation of urinary retention.  Concern for possible postop related, urology suggest continue indwelling Foley with outpatient follow-up in 4 weeks.  Noted to have hyponatremia.   Assessment & Plan:   Principal Problem:   Urinary retention Active Problems:   Hyponatremia   Hypertension   Lumbosacral radiculopathy   Constipation  Acute on chronic hyponatremia -Baseline sodium appears to be 127.  Very slow to improve.  Today's 126. -Continue salt tablets - BNP is relatively stable.  Cortisol level slightly low but she is on outpatient prednisone.  TSH is within normal limits.  Urinary retention with recent lumbar surgery - Case was discussed by previous provider with neurosurgery who does not think this is secondary to her surgery.  Seen by urology who recommends Foley catheter placement and outpatient follow-up in 4 weeks.  Continue to provide supportive care.  Pain control.  Generalized weakness with lower extremity numbness and tingling - Lower extremity numbness and tingling was even present prior to her recent lumbar spinal stenosis surgery. -PT/OT daily.  PT recommending home health with supervision at this time  Anemia of chronic disease -Stable hemoglobin at 8.0  Chronic diastolic congestive heart failure with preserved ejection fraction, a 55% -Euvolemic  COPD - Stable.  Essential hypertension -Cardizem, hydralazine, losartan  Moderate to severe protein calorie malnutrition with BMI less than 20.  Encourage oral intake.  Patient has quite a bit of anxiety to be able to go home with Foley catheter.  She  barely has any assistance at home therefore she would highly benefit from maximizing home health therapy.  DVT prophylaxis:  Code Status: Full code Family Communication: None at bedside Disposition Plan: Continue another 24 hours of gentle hydration until sodium levels are slightly more improved along with her hydration status.  Consultants:   Urology  Neurosurgery  Procedures:   None  Antimicrobials:   None   Subjective: Feeling very weak still with maybe little bit of nausea.  Overall appears to be very anxious that she has to go home with Foley catheter but she understands the reason why she needs to go home with Foley.  Review of Systems Otherwise negative except as per HPI, including: General = no fevers, chills, dizziness, malaise, fatigue HEENT/EYES = negative for pain, redness, loss of vision, double vision, blurred vision, loss of hearing, sore throat, hoarseness, dysphagia Cardiovascular= negative for chest pain, palpitation, murmurs, lower extremity swelling Respiratory/lungs= negative for shortness of breath, cough, hemoptysis, wheezing, mucus production Gastrointestinal= negative for nausea, vomiting,, abdominal pain, melena, hematemesis Genitourinary= negative for Dysuria, Hematuria, Change in Urinary Frequency MSK = Negative for arthralgia, myalgias, Back Pain, Joint swelling  Neurology= Negative for headache, seizures, numbness, tingling  Psychiatry= Negative for anxiety, depression, suicidal and homocidal ideation Allergy/Immunology= Medication/Food allergy as listed  Skin= Negative for Rash, lesions, ulcers, itching   Objective: Vitals:   02/19/19 2000 02/19/19 2307 02/20/19 0840 02/20/19 0948  BP: (!) 161/74 (!) 162/77 (!) 111/59 (!) 126/54  Pulse: 77 83 87   Resp: 18 20 20    Temp: 98.6 F (37 C) 97.9 F (36.6 C) 98 F (36.7 C)   TempSrc: Oral Oral Oral   SpO2:  100% 100%   Weight:  Height:        Intake/Output Summary (Last 24 hours) at  02/20/2019 1237 Last data filed at 02/20/2019 0500 Gross per 24 hour  Intake 1927.81 ml  Output 2175 ml  Net -247.19 ml   Filed Weights   02/14/19 1940 02/14/19 1948  Weight: 47.6 kg 47.6 kg    Examination:  Constitutional: NAD, calm, comfortable, weak frail bilateral temporal wasting Eyes: PERRL, lids and conjunctivae normal ENMT: Mucous membranes are slightly dry t. Posterior pharynx clear of any exudate or lesions.Normal dentition.  Neck: normal, supple, no masses, no thyromegaly Respiratory: Mild bibasilar crackles Cardiovascular: Regular rate and rhythm, no murmurs / rubs / gallops. No extremity edema. 2+ pedal pulses. No carotid bruits.  Abdomen: no tenderness, no masses palpated. No hepatosplenomegaly. Bowel sounds positive.  Musculoskeletal: no clubbing / cyanosis. No joint deformity upper and lower extremities. Good ROM, no contractures. Normal muscle tone.  Skin: no rashes, lesions, ulcers. No induration Neurologic: CN 2-12 grossly intact. Sensation intact, DTR normal. Strength 5/5 in all 4.  Psychiatric: Normal judgment and insight. Alert and oriented x 3. Normal mood.  The anxious Foley catheter in place    Data Reviewed:   CBC: Recent Labs  Lab 02/14/19 2059 02/15/19 0609 02/15/19 1143 02/16/19 0301  WBC 9.8 6.6 5.8 5.8  NEUTROABS 8.5*  --   --   --   HGB 8.6* 7.6* 7.9* 8.0*  HCT 24.2* 21.2* 22.2* 22.1*  MCV 101.3* 100.0 99.6 100.0  PLT 221 191 190 939   Basic Metabolic Panel: Recent Labs  Lab 02/18/19 0712 02/18/19 2050 02/19/19 0738 02/19/19 1507 02/20/19 0638  NA 123* 121* 125* 123* 126*  K 4.0 4.5 4.4 4.5 4.4  CL 96* 92* 95* 95* 99  CO2 19* 21* 21* 20* 21*  GLUCOSE 105* 118* 106* 152* 106*  BUN 23 30* 25* 27* 23  CREATININE 1.00 1.32* 1.05* 1.37* 1.00  CALCIUM 8.5* 9.2 9.1 8.7* 8.6*  MG  --   --   --   --  1.9   GFR: Estimated Creatinine Clearance: 37.7 mL/min (by C-G formula based on SCr of 1 mg/dL). Liver Function Tests: No results  for input(s): AST, ALT, ALKPHOS, BILITOT, PROT, ALBUMIN in the last 168 hours. No results for input(s): LIPASE, AMYLASE in the last 168 hours. No results for input(s): AMMONIA in the last 168 hours. Coagulation Profile: No results for input(s): INR, PROTIME in the last 168 hours. Cardiac Enzymes: No results for input(s): CKTOTAL, CKMB, CKMBINDEX, TROPONINI in the last 168 hours. BNP (last 3 results) No results for input(s): PROBNP in the last 8760 hours. HbA1C: No results for input(s): HGBA1C in the last 72 hours. CBG: No results for input(s): GLUCAP in the last 168 hours. Lipid Profile: No results for input(s): CHOL, HDL, LDLCALC, TRIG, CHOLHDL, LDLDIRECT in the last 72 hours. Thyroid Function Tests: Recent Labs    02/19/19 0935  TSH 0.944   Anemia Panel: No results for input(s): VITAMINB12, FOLATE, FERRITIN, TIBC, IRON, RETICCTPCT in the last 72 hours. Sepsis Labs: No results for input(s): PROCALCITON, LATICACIDVEN in the last 168 hours.  Recent Results (from the past 240 hour(s))  Urine culture     Status: None   Collection Time: 02/14/19  8:33 PM   Specimen: Urine, Random  Result Value Ref Range Status   Specimen Description URINE, RANDOM  Final   Special Requests NONE  Final   Culture   Final    NO GROWTH Performed at [facility],  [address], [place], [place] [address]    Report Status 02/16/2019 FINAL  Final  SARS Coronavirus 2     Status: None   Collection Time: 02/15/19 12:24 AM  Result Value Ref Range Status   SARS Coronavirus 2 NOT DETECTED NOT DETECTED Final    Comment: (NOTE) SARS-CoV-2 target nucleic acids are NOT DETECTED. The SARS-CoV-2 RNA is generally detectable in upper and lower respiratory specimens during the acute phase of infection.  Negative  results do not preclude SARS-CoV-2 infection, do not rule out co-infections with other pathogens, and should not be used as the sole basis for treatment or other patient management decisions.   Negative results must be combined with clinical observations, patient history, and epidemiological information. The expected result is Not Detected. Fact Sheet for Patients: http://www.biofiredefense.com/wp-content/uploads/2020/03/BIOFIRE-COVID -19-patients.pdf Fact Sheet for Healthcare Providers: http://www.biofiredefense.com/wp-content/uploads/2020/03/BIOFIRE-COVID -19-hcp.pdf This test is not yet approved or cleared by the [place] and  has been authorized for detection and/or diagnosis of SARS-CoV-2 by FDA under an Emergency Use Authorization (EUA).  This EUA will remain in effec t (meaning this test can be used) for the duration of  the COVID-19 declaration under Section 564(b)(1) of the Act, 21 U.S.C. section 360bbb-3(b)(1), unless the authorization is terminated or revoked sooner. Performed at [facility], [place] [address], [place], [place] [address]          Radiology Studies: No results found.      Scheduled Meds: . busPIRone  30 mg Oral BID  . diltiazem  360 mg Oral Daily  . feeding supplement  1 Container Oral TID BM  . hydrALAZINE  50 mg Oral Q8H  . losartan  100 mg Oral Daily  . predniSONE  20 mg Oral BID WC  . senna-docusate  1 tablet Oral BID  . sodium chloride  1 g Oral BID WC  . spironolactone  12.5 mg Oral Daily   Continuous Infusions:   LOS: 5 days   Time spent= 25 mins    Sigmund Morera Arsenio Loader, MD [facility]  If 7PM-7AM, please contact night-coverage [email or web address] 02/20/2019, 12:37 PM

## 2019-02-21 DIAGNOSIS — I10 Essential (primary) hypertension: Secondary | ICD-10-CM

## 2019-02-21 DIAGNOSIS — F419 Anxiety disorder, unspecified: Secondary | ICD-10-CM

## 2019-02-21 DIAGNOSIS — F329 Major depressive disorder, single episode, unspecified: Secondary | ICD-10-CM

## 2019-02-21 LAB — BASIC METABOLIC PANEL
Anion gap: 5 (ref 5–15)
BUN: 24 mg/dL — ABNORMAL HIGH (ref 8–23)
CO2: 21 mmol/L — ABNORMAL LOW (ref 22–32)
Calcium: 8.3 mg/dL — ABNORMAL LOW (ref 8.9–10.3)
Chloride: 99 mmol/L (ref 98–111)
Creatinine, Ser: 1.15 mg/dL — ABNORMAL HIGH (ref 0.44–1.00)
GFR calc Af Amer: 55 mL/min — ABNORMAL LOW (ref >60)
GFR calc non Af Amer: 47 mL/min — ABNORMAL LOW (ref >60)
Glucose, Bld: 111 mg/dL — ABNORMAL HIGH (ref 70–99)
Potassium: 4.4 mmol/L (ref 3.5–5.1)
Sodium: 125 mmol/L — ABNORMAL LOW (ref 135–145)

## 2019-02-21 LAB — MAGNESIUM: Magnesium: 1.8 mg/dL (ref 1.7–2.4)

## 2019-02-21 MED ORDER — ATORVASTATIN CALCIUM 40 MG PO TABS
40.0000 mg | ORAL_TABLET | Freq: Every day | ORAL | Status: DC
  Administered 2019-02-21: 17:00:00 40 mg via ORAL
  Filled 2019-02-21: qty 1

## 2019-02-21 MED ORDER — PREDNISONE 5 MG PO TABS
5.0000 mg | ORAL_TABLET | Freq: Every day | ORAL | Status: DC
  Administered 2019-02-22: 5 mg via ORAL
  Filled 2019-02-21: qty 1

## 2019-02-21 MED ORDER — BETHANECHOL CHLORIDE 25 MG PO TABS
25.0000 mg | ORAL_TABLET | Freq: Three times a day (TID) | ORAL | Status: DC
  Administered 2019-02-21 – 2019-02-22 (×2): 25 mg via ORAL
  Filled 2019-02-21 (×2): qty 1

## 2019-02-21 MED ORDER — HYDROXYCHLOROQUINE SULFATE 200 MG PO TABS
200.0000 mg | ORAL_TABLET | Freq: Every day | ORAL | Status: DC
  Administered 2019-02-21 – 2019-02-22 (×2): 200 mg via ORAL
  Filled 2019-02-21 (×2): qty 1

## 2019-02-21 MED ORDER — HYDRALAZINE HCL 50 MG PO TABS: 100.0000 mg | ORAL_TABLET | Freq: Three times a day (TID) | ORAL | Status: DC

## 2019-02-21 MED ORDER — HYDRALAZINE HCL 50 MG PO TABS
75.0000 mg | ORAL_TABLET | Freq: Three times a day (TID) | ORAL | Status: DC
  Administered 2019-02-21 – 2019-02-22 (×3): 75 mg via ORAL
  Filled 2019-02-21 (×3): qty 1

## 2019-02-21 NOTE — Progress Notes (Signed)
Nutrition Follow-up   RD working remotely.  DOCUMENTATION CODES:   Underweight  INTERVENTION:  Continue Boost Breeze po TID, each supplement provides 250 kcal and 9 grams of protein.  Encourage adequate PO intake.   NUTRITION DIAGNOSIS:   Increased nutrient needs related to chronic illness(COPD) as evidenced by estimated needs; ongoing  GOAL:   Patient will meet greater than or equal to 90% of their needs; progressing  MONITOR:   PO intake, Supplement acceptance, Labs, Weight trends, Skin, I & O's  REASON FOR ASSESSMENT:   Malnutrition Screening Tool    ASSESSMENT:   73 y.o. female with medical history significant of herniated disc L4/5 with cauda equina syndrome s/p surgical decompression 01/28/19, chronic hyponatremia, COPD, GERD, hypertension, LBBB, macrocytosis, seronegative rheumatoid arthritis, chronic venous insufficiency presents with urinary retention. Pt with indwelling foley.   Meal completion 25-60%. Pt currently has Boost Breeze ordered with varied consumption. RD to continue with current orders to aid in caloric and protein needs. Pt encouraged to eat her food at meals and to drink her supplements. Labs and medications reviewed.   Diet Order:   Diet Order            Diet Heart Room service appropriate? No; Fluid consistency: Thin  Diet effective now              EDUCATION NEEDS:   Not appropriate for education at this time  Skin:  Skin Assessment: Skin Integrity Issues: Skin Integrity Issues:: Stage I Stage I: coccyx  Last BM:  6/21  Height:   Ht Readings from Last 1 Encounters:  02/14/19 5\' 4"  (1.626 m)    Weight:   Wt Readings from Last 1 Encounters:  02/14/19 47.6 kg    Ideal Body Weight:  54.5 kg  BMI:  Body mass index is 18.02 kg/m.  Estimated Nutritional Needs:   Kcal:  1500-1700  Protein:  70-80 grams  Fluid:  >/= 1.5 L/day    Corrin Parker, MS, RD, LDN Pager # [phone] After hours/ weekend pager # [phone]

## 2019-02-21 NOTE — Progress Notes (Signed)
Physical Therapy Treatment Patient Details Name: Regina Ortiz MRN: [id number] DOB: Jan 14, 1946 Today's Date: 02/21/2019    History of Present Illness 74 y.o. female admitted on 02/14/19 for urinary retention, perineal hypoesthesia, lumbosacral radiculopathy, hyponatremia, constipation.  Pt with significant PMH of SOB with exertion, LBBB, HTN, COPD, anxiety, L4/5 decompression and fusion on 01/28/19 with d/c to [facility] x 8 days, then home.      PT Comments    Patient progressing with ambulation distance, but fatigued at end.  Still needing assist for walker safety and due to generalized weakness.  Felt fatigued already due to thought going home early in the day and prepping for d/c, then found not going home.  Plans for home with assist and follow up HHPT remain appropriate.  PT to follow acutely.    Follow Up Recommendations  Home health PT;Supervision - Intermittent     Equipment Recommendations  None recommended by PT    Recommendations for Other Services       Precautions / Restrictions Precautions Precautions: Fall;Back Required Braces or Orthoses: Spinal Brace Spinal Brace: Lumbar corset;Applied in sitting position(brace at home)    Mobility  Bed Mobility Overal bed mobility: Needs Assistance     Sidelying to sit: Supervision     Sit to sidelying: Min assist General bed mobility comments: uses bed rail to pull up, HOB elevated, assist for 1 foot to supine  Transfers Overall transfer level: Needs assistance Equipment used: Rolling walker (2 wheeled) Transfers: Sit to/from Stand Sit to Stand: Min guard         General transfer comment: assist for balance  Ambulation/Gait Ambulation/Gait assistance: Min guard;Min assist Gait Distance (Feet): 150 Feet Assistive device: Rolling walker (2 wheeled) Gait Pattern/deviations: Step-through pattern;Decreased stride length     General Gait Details: assist to maneuver walker around door facing and obstacles in the  room on the way back in with pt report feeling fatigued and increased pain   Stairs             Wheelchair Mobility    Modified Rankin (Stroke Patients Only)       Balance Overall balance assessment: Needs assistance   Sitting balance-Leahy Scale: Good       Standing balance-Leahy Scale: Fair                              Cognition Arousal/Alertness: Awake/alert Behavior During Therapy: WFL for tasks assessed/performed Overall Cognitive Status: Within Functional Limits for tasks assessed                       Memory: Decreased short-term memory                Exercises      General Comments General comments (skin integrity, edema, etc.): ambulated into hallway then sat in desk chair for PT to obtain mask to walk in hallway.      Pertinent Vitals/Pain Pain Score: 5  Pain Location: head and back Pain Descriptors / Indicators: Aching;Headache Pain Intervention(s): Monitored during session;Repositioned;Limited activity within patient's tolerance    Home Living                      Prior Function            PT Goals (current goals can now be found in the care plan section) Progress towards PT goals: Progressing toward goals    Frequency  Min 3X/week      PT Plan Current plan remains appropriate    Co-evaluation              AM-PAC PT "6 Clicks" Mobility   Outcome Measure  Help needed turning from your back to your side while in a flat bed without using bedrails?: A Little Help needed moving from lying on your back to sitting on the side of a flat bed without using bedrails?: A Little Help needed moving to and from a bed to a chair (including a wheelchair)?: A Little Help needed standing up from a chair using your arms (e.g., wheelchair or bedside chair)?: A Little Help needed to walk in hospital room?: A Little Help needed climbing 3-5 steps with a railing? : A Little 6 Click Score: 18    End of  Session   Activity Tolerance: Patient limited by fatigue Patient left: in bed;with call bell/phone within reach;with bed alarm set   PT Visit Diagnosis: Other abnormalities of gait and mobility (R26.89)     Time: 1430-1456 PT Time Calculation (min) (ACUTE ONLY): 26 min  Charges:  $Gait Training: 8-22 mins $Therapeutic Activity: 8-22 mins                     Magda Kiel, [place] [phone] 02/21/2019    Reginia Naas 02/21/2019, 4:54 PM

## 2019-02-21 NOTE — TOC Progression Note (Signed)
Transition of Care [facility]) - Progression Note    Patient Details  Name: Regina Ortiz MRN: [id number] Date of Birth: 30-Sep-1945  Transition of Care [facility]) CM/SW [place], [place] Phone Number: 02/21/2019, 11:38 AM  Clinical Narrative:    Pt son contacted CSW with concerns related to BP and pt anxiety; concerns relayed to MD for them to return call to pt son.  Pt set up with [facility]; when stable for dc will go home with aide and support of son. Pt son aware of foley.   Expected Discharge Plan: [place] Barriers to Discharge: Continued Medical Work up  Expected Discharge Plan and Services Expected Discharge Plan: [place] In-house Referral: Clinical Social Work Discharge Planning Services: CM Consult Post Acute Care Choice: NA Living arrangements for the past 2 months: [place], [place] Expected Discharge Date: 02/20/19               HH Arranged: PT [place]: [place] Date [place]: 02/16/19 Time [place]: 1191 Representative spoke with at [place]: [place] ([place]) Interventions    Readmission Risk Interventions Readmission Risk Prevention Plan 02/16/2019  Transportation Screening Complete  PCP or Specialist Appt within 3-5 Days Complete  HRI or [place] Complete  Social Work Consult for [place] Planning/Counseling Complete  Palliative Care Screening Not Applicable  Medication Review Press photographer) Complete  Some recent data might be hidden

## 2019-02-21 NOTE — Progress Notes (Signed)
Patient ID: Regina Ortiz, female   DOB: 1945-10-20, 73 y.o.   MRN: [id number] Back incision appears to be healing well Foley catheter in place Motor function appears stable And is some can be tapered or discontinued to get better testing of adrenal function I have little else to add from a neurosurgical standpoint I will follow her up as an outpatient in approximately 1 month's time

## 2019-02-21 NOTE — Care Management Important Message (Signed)
Important Message  Patient Details  Name: Regina Ortiz MRN: [id number] Date of Birth: 05/07/1946   Medicare Important Message Given:  Yes     Memory Argue 02/21/2019, 3:22 PM

## 2019-02-21 NOTE — Progress Notes (Signed)
PROGRESS NOTE  TAMANI DURNEY UTM:[id number] DOB: Apr 25, 1946 DOA: 02/14/2019 PCP: Maurice Small, MD   LOS: 6 days   Patient is from: Home  Brief Narrative / Interim history: 73 year old with history of herniated disc L4/L5 with cauda equina syndrome status post decompression 01/28/2019, chronic hyponatremia, GERD, essential hypertension, rheumatoid arthritis, chronic venous insufficiency came to the hospital for evaluation of urinary retention.  Concern for possible postop related, urology suggest continue indwelling Foley with outpatient follow-up in 4 weeks.  Noted to have hyponatremia. Started on IV fluid and p.o. sodium chloride.    Assessment & Plan: Acute on chronic hyponatremia: Sodium 125 today.  Baseline 127-129.  Patient is on Aldactone and losartan which could contribute.  TSH within normal range.  Cortisol level low but she is on prednisone.  BNP marginal. -Discontinue losartan and Aldactone -Continue p.o. sodium chloride -Discontinue IV fluid. -Recheck sodium in the morning  Urinary retention with recent lumbar surgery -seems she was on oxycodone and Flexeril which could contribute. -Not related to his surgery per discussion between previous provider and neurosurgeon. -Urology recommended Foley and outpatient follow-up in 4 weeks per previous provider.  -Start bethanechol at 25 mg 3 times daily -We will try voiding trial prior to discharge  Anxiety/depression: Very anxious this morning. -Decrease prednisone to home dose. -Continue home BuSpar-on very high dose -Continue home Xanax  Essential hypertension: Normotensive -Continue home Cardizem -Increase hydralazine to 75 mg 3 times daily -Discontinue losartan and Aldactone.  Could be contributing to her hyponatremia.  Generalized weakness with lower extremity numbness and tingling: Chronic issue. -PT/OT-HH with supervision  Anemia of chronic disease: Hgb 8.0, which is baseline. -Check anemia panel  Chronic  diastolic congestive heart failure with preserved ejection fraction, a 55% -Euvolemic  COPD -Stable.  RA: stable -Continue home Plaquenil.  Moderate to severe protein calorie malnutrition with BMI less than 20.   -Encourage oral intake. -Continue supplements  Scheduled Meds: . busPIRone  30 mg Oral BID  . diltiazem  360 mg Oral Daily  . feeding supplement  1 Container Oral TID BM  . hydrALAZINE  75 mg Oral Q8H  . predniSONE  20 mg Oral BID WC  . senna-docusate  1 tablet Oral BID  . sodium chloride  1 g Oral BID WC   Continuous Infusions: PRN Meds:.acetaminophen, ALPRAZolam, hydrALAZINE, ipratropium-albuterol, polyethylene glycol, traMADol   DVT prophylaxis: SCD Code Status: Full code Family Communication: Updated patient's son over the phone. Disposition Plan: Remains inpatient.  Anticipate discharge in the next 24-hour if sodium improves.  Very anxious today.  Subjective: No major events overnight of this morning.  She is very anxious.  Sodium remains low at 125.  Denies chest pain, dyspnea, palpitation, dizziness, GI or GU symptoms.  Foley in place.   Objective: Vitals:   02/20/19 1920 02/20/19 2340 02/21/19 0359 02/21/19 0840  BP: (!) 161/67 (!) 152/70 (!) 161/73 (!) 159/58  Pulse: 86 82 80 93  Resp: 18 16 17 18   Temp: 97.7 F (36.5 C) 98.1 F (36.7 C) 98.7 F (37.1 C) 98.4 F (36.9 C)  TempSrc: Oral Oral Oral Oral  SpO2: 100% 98% 99% 100%  Weight:      Height:        Intake/Output Summary (Last 24 hours) at 02/21/2019 0846 Last data filed at 02/21/2019 0600 Gross per 24 hour  Intake 647.31 ml  Output 2250 ml  Net -1602.69 ml   Filed Weights   02/14/19 1940 02/14/19 1948  Weight: 47.6 kg 47.6 kg  Examination:  GENERAL: No acute distress.  Appears well.  HEENT: MMM.  Vision and hearing grossly intact.  NECK: Supple.  No JVD.  LUNGS:  No IWOB. Good air movement bilaterally. HEART:  RRR . Heart sounds normal.  ABD: Bowel sounds present. Soft.  Non tender.  MSK/EXT:  Moves all extremities. No apparent deformity. No edema bilaterally.  SKIN: no apparent skin lesion or wound NEURO: Awake, alert and oriented appropriately.  No gross deficit.  PSYCH: Calm. Normal affect.   Consultants:   Neurosurgery  Neurology  Procedures:   None  Microbiology: . COVID-19 negative. . Urine culture negative.  Antimicrobials: Anti-infectives (From admission, onward)   None       Data Reviewed: I have independently reviewed following labs and imaging studies  CBC: Recent Labs  Lab 02/14/19 2059 02/15/19 0609 02/15/19 1143 02/16/19 0301  WBC 9.8 6.6 5.8 5.8  NEUTROABS 8.5*  --   --   --   HGB 8.6* 7.6* 7.9* 8.0*  HCT 24.2* 21.2* 22.2* 22.1*  MCV 101.3* 100.0 99.6 100.0  PLT 221 191 190 546   Basic Metabolic Panel: Recent Labs  Lab 02/18/19 2050 02/19/19 0738 02/19/19 1507 02/20/19 0638 02/21/19 0559  NA 121* 125* 123* 126* 125*  K 4.5 4.4 4.5 4.4 4.4  CL 92* 95* 95* 99 99  CO2 21* 21* 20* 21* 21*  GLUCOSE 118* 106* 152* 106* 111*  BUN 30* 25* 27* 23 24*  CREATININE 1.32* 1.05* 1.37* 1.00 1.15*  CALCIUM 9.2 9.1 8.7* 8.6* 8.3*  MG  --   --   --  1.9 1.8   GFR: Estimated Creatinine Clearance: 32.7 mL/min (A) (by C-G formula based on SCr of 1.15 mg/dL (H)). Liver Function Tests: No results for input(s): AST, ALT, ALKPHOS, BILITOT, PROT, ALBUMIN in the last 168 hours. No results for input(s): LIPASE, AMYLASE in the last 168 hours. No results for input(s): AMMONIA in the last 168 hours. Coagulation Profile: No results for input(s): INR, PROTIME in the last 168 hours. Cardiac Enzymes: No results for input(s): CKTOTAL, CKMB, CKMBINDEX, TROPONINI in the last 168 hours. BNP (last 3 results) No results for input(s): PROBNP in the last 8760 hours. HbA1C: No results for input(s): HGBA1C in the last 72 hours. CBG: No results for input(s): GLUCAP in the last 168 hours. Lipid Profile: No results for input(s): CHOL,  HDL, LDLCALC, TRIG, CHOLHDL, LDLDIRECT in the last 72 hours. Thyroid Function Tests: Recent Labs    02/19/19 0935  TSH 0.944   Anemia Panel: No results for input(s): VITAMINB12, FOLATE, FERRITIN, TIBC, IRON, RETICCTPCT in the last 72 hours. Urine analysis:    Component Value Date/Time   COLORURINE YELLOW 02/14/2019 2141   APPEARANCEUR CLEAR 02/14/2019 2141   LABSPEC 1.008 02/14/2019 2141   PHURINE 7.0 02/14/2019 2141   GLUCOSEU NEGATIVE 02/14/2019 2141   HGBUR NEGATIVE 02/14/2019 2141   BILIRUBINUR NEGATIVE 02/14/2019 2141   KETONESUR NEGATIVE 02/14/2019 2141   PROTEINUR 30 (A) 02/14/2019 2141   UROBILINOGEN 0.2 09/22/2012 1100   NITRITE NEGATIVE 02/14/2019 2141   LEUKOCYTESUR NEGATIVE 02/14/2019 2141   Sepsis Labs: Invalid input(s): PROCALCITONIN, LACTICIDVEN  Recent Results (from the past 240 hour(s))  Urine culture     Status: None   Collection Time: 02/14/19  8:33 PM   Specimen: Urine, Random  Result Value Ref Range Status   Specimen Description URINE, RANDOM  Final   Special Requests NONE  Final   Culture   Final    NO GROWTH Performed  at [facility], [place] [address] [place], [place] [address]    Report Status 02/16/2019 FINAL  Final  SARS Coronavirus 2     Status: None   Collection Time: 02/15/19 12:24 AM  Result Value Ref Range Status   SARS Coronavirus 2 NOT DETECTED NOT DETECTED Final    Comment: (NOTE) SARS-CoV-2 target nucleic acids are NOT DETECTED. The SARS-CoV-2 RNA is generally detectable in upper and lower respiratory specimens during the acute phase of infection.  Negative  results do not preclude SARS-CoV-2 infection, do not rule out co-infections with other pathogens, and should not be used as the sole basis for treatment or other patient management decisions.  Negative results must be combined with clinical observations, patient history, and epidemiological information. The expected result is Not Detected. Fact Sheet for Patients:  http://www.biofiredefense.com/wp-content/uploads/2020/03/BIOFIRE-COVID -19-patients.pdf Fact Sheet for Healthcare Providers: http://www.biofiredefense.com/wp-content/uploads/2020/03/BIOFIRE-COVID -19-hcp.pdf This test is not yet approved or cleared by the [place] and  has been authorized for detection and/or diagnosis of SARS-CoV-2 by FDA under an Emergency Use Authorization (EUA).  This EUA will remain in effec t (meaning this test can be used) for the duration of  the COVID-19 declaration under Section 564(b)(1) of the Act, 21 U.S.C. section 360bbb-3(b)(1), unless the authorization is terminated or revoked sooner. Performed at [facility], [place] [address] [place], [place] [address]      Radiology Studies: No results found.   Taye T. Newport  If 7PM-7AM, please contact night-coverage [email or web address] Password [facility] 02/21/2019, 8:46 AM

## 2019-02-22 LAB — BASIC METABOLIC PANEL
Anion gap: 8 (ref 5–15)
BUN: 24 mg/dL — ABNORMAL HIGH (ref 8–23)
CO2: 21 mmol/L — ABNORMAL LOW (ref 22–32)
Calcium: 8.5 mg/dL — ABNORMAL LOW (ref 8.9–10.3)
Chloride: 97 mmol/L — ABNORMAL LOW (ref 98–111)
Creatinine, Ser: 1.06 mg/dL — ABNORMAL HIGH (ref 0.44–1.00)
GFR calc Af Amer: >60 mL/min (ref >60)
GFR calc non Af Amer: 52 mL/min — ABNORMAL LOW (ref >60)
Glucose, Bld: 86 mg/dL (ref 70–99)
Potassium: 3.8 mmol/L (ref 3.5–5.1)
Sodium: 126 mmol/L — ABNORMAL LOW (ref 135–145)

## 2019-02-22 LAB — MAGNESIUM: Magnesium: 1.8 mg/dL (ref 1.7–2.4)

## 2019-02-22 MED ORDER — ACETAMINOPHEN 325 MG PO TABS: 650.0000 mg | ORAL_TABLET | Freq: Four times a day (QID) | ORAL | 0 refills | Status: DC

## 2019-02-22 MED ORDER — CARVEDILOL 6.25 MG PO TABS: 6.2500 mg | ORAL_TABLET | Freq: Two times a day (BID) | ORAL | 3 refills | Status: DC

## 2019-02-22 MED ORDER — CLONAZEPAM 0.5 MG PO TABS: 0.5000 mg | ORAL_TABLET | Freq: Two times a day (BID) | ORAL | 0 refills | Status: DC | PRN

## 2019-02-22 MED ORDER — HYDRALAZINE HCL 25 MG PO TABS: 75.0000 mg | ORAL_TABLET | Freq: Three times a day (TID) | ORAL | 1 refills | Status: DC

## 2019-02-22 MED FILL — hydrALAZINE HCL 25 MG TABS: 25 | 30 days supply | Qty: 270 | Fill #0

## 2019-02-22 MED FILL — CARVEDILOL 6.25 MG TABLET: 6.25 | 90 days supply | Qty: 180 | Fill #0

## 2019-02-22 MED FILL — clonazePAM 0.5 MG TABS: 0.5 | 30 days supply | Qty: 60 | Fill #0

## 2019-02-22 NOTE — Progress Notes (Addendum)
Educated pt on foley care. Gave pt a practice foley to practice draining the foley. Let pt drain her own foley.

## 2019-02-22 NOTE — Progress Notes (Signed)
Patient stated she had all her belonging she brought with her in the room/her possession. Simmie Davies RN

## 2019-02-22 NOTE — Discharge Summary (Signed)
Physician Discharge Summary  Regina Ortiz DJS:[id number] DOB: 08/04/1946 DOA: 02/14/2019  PCP: Maurice Small, MD  Admit date: 02/14/2019 Discharge date: 02/22/2019  Admitted From: Home Disposition: Home  Recommendations for Outpatient Follow-up:  1. Follow up with PCP in 1-2 weeks 2. Follow-up with urology in 3 to 4 weeks 3.  4. Please obtain CBC/BMP/Mag at follow up 5. Please follow up on the following pending results: None  Home Health: PT/OT/RN/aide Equipment/Devices: 3 in 1 commode  Discharge Condition: Stable CODE STATUS: Full code   Hospital Course: 74 year old with history of herniated disc L4/L5 with cauda equina syndrome status post decompression 01/28/2019, chronic hyponatremia, GERD, essential hypertension, rheumatoid arthritis, chronic venous insufficiency came to the hospital for evaluation of urinary retention. Concern for possible postop related, urology suggest continue indwelling Foley with outpatient follow-up in 4 weeks. Noted to have hyponatremia. Started on IV fluid and p.o. sodium chloride.   Losartan and Aldactone discontinued.  Sodium improved to 126, which is close to baseline (127-129).   Patient was evaluated by PT/OT who recommended home health PT/OT.  See individual problem list below for more.  Discharge Diagnoses:  Acute on chronic hyponatremia: Sodium 126 today.  Baseline 127-129.  Patient is on Aldactone and losartan which could contribute.  TSH within normal range.  Cortisol level low but she is on prednisone.  BNP marginal. -Discontinued losartan and Aldactone -Recommend rechecking BMP at follow-up.  Urinary retention with recent lumbar surgery -seems she was on oxycodone and Flexeril which could contribute. -Neurosurgery did not feel this is related to her recent surgery. -Urology recommended Foley and outpatient follow-up in 4 weeks  -Outpatient urology follow-up in 3 to 4 weeks.  L4-L5 disc herniation status post surgical decompression  and stabilization with pedicle screw fixation on 01/28/2019 by neurosurgery  Generalized weakness with lower extremity numbness and tingling: -Neurosurgery increased prednisone to 20 mg twice daily for possible postoperative edema which was reduced to home dose, 5 mg daily prior to discharge. -Scheduled Tylenol and PRN tramadol for pain -Home health PT/OT -Neurosurgery follow-up in about 1 months.  Anxiety/depression: Improved after decreasing prednisone. -Continue home BuSpar-on very high dose -Changed home Xanax to Klonopin. -Continue home Zoloft.  Essential hypertension: Normotensive -Continue home Cardizem -Added Coreg 6.25 mg twice daily -Increased hydralazine to 75 mg 3 times daily -Discontinued losartan and Aldactone.  Could be contributing to her hyponatremia.  Anemia of chronic disease: Hgb 8.0, which is baseline. -Check anemia panel  Chronic diastolic congestive heart failure with preserved ejection fraction, a 55% -Euvolemic  COPD -Stable.  RA: stable -Continue home Plaquenil.   Discharge Instructions  Discharge Instructions    Call MD for:  difficulty breathing, headache or visual disturbances   Complete by: As directed    Call MD for:  persistant dizziness or light-headedness   Complete by: As directed    Call MD for:  severe uncontrolled pain   Complete by: As directed    Call MD for:  temperature >100.4   Complete by: As directed    Diet general   Complete by: As directed    Discharge instructions   Complete by: As directed    It has been a pleasure taking care of you! You were admitted with urinary retention.  You have had a Foley catheter placed.  Would recommend follow-up with urology in 3 to 4 weeks.  In regards to your low sodium, we have made some changes to your medication during this hospitalization. Please review your new medication  list on the directions before you take your medications. Please follow-up with your primary care doctor in 1  to 2 weeks. Follow-up with your neurosurgeon and urology as recommended.  Please review your new medication list and directions before you take your medications.  Take care, Dr. Cyndia Skeeters   Increase activity slowly   Complete by: As directed      Allergies as of 02/22/2019      Reactions   Latex Swelling, Other (See Comments)    fever blisters   Augmentin [amoxicillin-pot Clavulanate] Nausea And Vomiting   Doxycycline Other (See Comments)   Blurred vision, nervous, unsteady   Lactose Intolerance (gi) Other (See Comments)   Unknown, per pt   Lialda [mesalamine] Diarrhea   Prinivil [lisinopril] Diarrhea   Triamcinolone Acetonide Other (See Comments)   REDNESS AND PAIN   Amlodipine Rash   Lotensin [benazepril Hcl] Rash      Medication List    STOP taking these medications   ALPRAZolam 0.5 MG tablet Commonly known as: XANAX   Biotin 5000 MCG Caps   Florastor 250 MG capsule Generic drug: saccharomyces boulardii   HYDROcodone-acetaminophen 5-325 MG tablet Commonly known as: NORCO/VICODIN   magnesium (amino acid chelate) 133 MG tablet   Potassium 99 MG Tabs   Turmeric 500 MG Caps     TAKE these medications   acetaminophen 325 MG tablet Commonly known as: TYLENOL Take 2 tablets (650 mg total) by mouth every 6 (six) hours.   alendronate 70 MG tablet Commonly known as: FOSAMAX Take 70 mg by mouth every Monday. Notes to patient: Take this medication as directed every Monday.    atorvastatin 40 MG tablet Commonly known as: LIPITOR Take 1 tablet by mouth once daily   busPIRone 30 MG tablet Commonly known as: BUSPAR Take 30 mg by mouth 2 (two) times daily.   CALTRATE 600+D PO Take 1 tablet by mouth daily.   carvedilol 6.25 MG tablet Commonly known as: Coreg Take 1 tablet (6.25 mg total) by mouth 2 (two) times daily with a meal.   clonazePAM 0.5 MG tablet Commonly known as: KlonoPIN Take 1 tablet (0.5 mg total) by mouth 2 (two) times daily as needed for up to  30 days for anxiety.   diltiazem 360 MG 24 hr capsule Commonly known as: TIAZAC Take 360 mg by mouth daily. Notes to patient: Has had dose for 02/22/19   hydrALAZINE 25 MG tablet Commonly known as: APRESOLINE Take 3 tablets (75 mg total) by mouth 3 (three) times daily. What changed:   medication strength  how much to take   hydroxychloroquine 200 MG tablet Commonly known as: PLAQUENIL Take 200 mg by mouth daily. Notes to patient: Start on 02/23/19.    polyethylene glycol 17 g packet Commonly known as: MIRALAX / GLYCOLAX Take 17 g by mouth daily as needed for mild constipation.   predniSONE 5 MG tablet Commonly known as: DELTASONE Take 5 mg by mouth daily with breakfast. --  LONG-TERM USE FOR RA   traMADol 50 MG tablet Commonly known as: ULTRAM Take 1 tablet (50 mg total) by mouth every 6 (six) hours as needed.   Vitamin D3 25 MCG (1000 UT) Caps Take 1,000 Units by mouth daily.      Follow-up Information    Maurice Small, MD. Go on 03/02/2019.   Specialty: Family Medicine Why: You have an appointment with Dr. Justin Mend at 11:30am, call [phone] when you arrive in the parking lot, bring a mask and if you have  a visitor they also must wear a mask. You will recieve further instructions regarding waiting in the car until ready.  Contact information: [address] [place] [address] [phone]        Cleon Gustin, MD. Schedule an appointment as soon as possible for a visit today.   Specialty: Urology Contact information: [place] [address] [phone]        Kristeen Miss, MD. Schedule an appointment as soon as possible for a visit in 1 month(s).   Specialty: Neurosurgery Contact information: [address] [place] [address] [phone]           Consultations:  Neurosurgery  Urology  Procedures/Studies:  2D Echo: None obtained this admission  Foley catheter placed  Ct Abdomen  Pelvis Wo Contrast  Result Date: 02/15/2019 CLINICAL DATA:  Constipation, urinary retention. Recent back surgery. EXAM: CT ABDOMEN AND PELVIS WITHOUT CONTRAST TECHNIQUE: Multidetector CT imaging of the abdomen and pelvis was performed following the standard protocol without IV contrast. COMPARISON:  None. FINDINGS: Lower chest: Lung bases are clear. No effusions. Heart is normal size. Hepatobiliary: No focal hepatic abnormality. Gallbladder unremarkable. Pancreas: No focal abnormality or ductal dilatation. Spleen: No focal abnormality.  Normal size. Adrenals/Urinary Tract: Perinephric stranding bilaterally. No hydronephrosis. Adrenal glands unremarkable. Foley catheter within the bladder which is decompressed. Stomach/Bowel: Large stool burden in the colon. Stomach, large and small bowel grossly unremarkable. Vascular/Lymphatic: Diffuse aortic atherosclerosis. No evidence of aneurysm or adenopathy. Reproductive: Prior hysterectomy.  No adnexal masses. Other: No free fluid or free air. Musculoskeletal: Postoperative changes in the lumbar spine. No acute bony abnormality. IMPRESSION: Mild perinephric stranding bilaterally. No hydronephrosis. Foley catheter within the bladder which is decompressed. Large stool burden in the colon suggesting constipation. No evidence of bowel obstruction. Diffuse aortic atherosclerosis. Electronically Signed   By: Rolm Baptise M.D.   On: 02/15/2019 02:48   Dg Lumbar Spine 2-3 Views  Result Date: 01/28/2019 CLINICAL DATA:  Posterior lumbar fusion EXAM: LUMBAR SPINE - 2-3 VIEW; DG C-ARM 61-120 MIN COMPARISON:  MRI 01/28/2019 FINDINGS: Two low resolution intraoperative spot views of the lumbar spine. Total fluoroscopy time was 24 seconds. Posterior spinal rods and fixating screws with interbody device at L4-L5. IMPRESSION: Intraoperative fluoroscopic assistance provided during lumbar fusion surgery Electronically Signed   By: Donavan Foil M.D.   On: 01/28/2019 20:55   Mr Lumbar  Spine Wo Contrast  Result Date: 02/15/2019 CLINICAL DATA:  73 y/o F; history of L4-5 herniated disc postsurgical decompression on 05/29 presenting with difficulty urinating. EXAM: MRI LUMBAR SPINE WITHOUT CONTRAST TECHNIQUE: Multiplanar, multisequence MR imaging of the lumbar spine was performed. No intravenous contrast was administered. COMPARISON:  01/28/2019 lumbar spine MRI. FINDINGS: Segmentation:  Standard. Alignment:  Physiologic. Vertebrae: L4-5 posterior instrumented fusion and bilateral laminectomy as well as left subarticular discectomy. There is a small fluid collection within the bilateral laminectomy beds extending into the left epidural space to the disc without significant surrounding bone marrow edema. No findings of acute fracture or discitis. Conus medullaris and cauda equina: Conus extends to the L1 level. Conus and cauda equina appear normal. Paraspinal and other soft tissues: There is edema within the subcutaneous fat and paraspinal muscles as well as well as multiple small multiloculated fluid collections along the posterior aspect of spinous processes extending from L3-L5. Left kidney cyst. Disc levels: T12-L1: No significant disc displacement, foraminal stenosis, or canal stenosis. L1-2: No significant disc displacement, foraminal stenosis, or  canal stenosis. L2-3: Mild disc bulge and facet hypertrophy. No significant foraminal or spinal canal stenosis. L3-4: Progressed moderate disc bulge eccentric to the left with facet and ligamentum flavum hypertrophy. Moderate spinal canal stenosis. Partial effacement of left-greater-than-right lateral recesses. Mild-to-moderate left neural foraminal stenosis. L4-5: Status post laminectomy, discectomy, and posterior fusion. Residual/recurrent central disc extrusion with superior migration to L4 infra pedicular level results in moderate to severe spinal canal stenosis. Additionally, residual disc bulge, endplate marginal osteophytes, and facet  hypertrophy moderately narrow the neural foramen bilaterally. L5-S1: Stable disc bulge, endplate marginal osteophytes eccentric to the right neural foramen, as well as facet hypertrophy. Severe right and mild left neural foraminal stenosis. No significant spinal canal stenosis. Partial effacement of lateral recesses. IMPRESSION: 1. L4-5 laminectomy, left subarticular discectomy, and posterior fusion. There are small fluid collections within the laminectomy beds, left L4-5 epidural space discectomy, and along the posterior aspect of spinous processes from L3-L5. No significant associated bone marrow edema, findings are favored to represent postoperative seroma over infection. 2. Residual/recurrent L4-5 central disc extrusion with superior migration resulting in moderate to severe spinal canal stenosis. 3. Progressed disc bulge at L3-4 level with moderate spinal canal stenosis. 4. Multilevel mild and moderate neural foraminal stenosis. Stable severe right L5-S1 foraminal stenosis. Electronically Signed   By: Kristine Garbe M.D.   On: 02/15/2019 02:41   Mr Lumbar Spine Wo Contrast  Result Date: 01/28/2019 CLINICAL DATA:  Chronic low back pain.  Generalized weakness. EXAM: MRI LUMBAR SPINE WITHOUT CONTRAST TECHNIQUE: Multiplanar, multisequence MR imaging of the lumbar spine was performed. No intravenous contrast was administered. COMPARISON:  06/23/2018 FINDINGS: Segmentation: As on the prior MRI, the lowest fully formed intervertebral disc space is designated L5-S1. Alignment:  Lumbar dextroscoliosis with apex at L2-3.  No listhesis. Vertebrae: No fracture or suspicious osseous lesion. Degenerative endplate changes at O2-H4 including low level edema. Conus medullaris and cauda equina: Conus extends to the L1 level. Conus and cauda equina appear normal. Paraspinal and other soft tissues: Bilateral renal cysts. Disc levels: Disc desiccation throughout the lumbar spine with unchanged moderate disc space  narrowing at L3-4 and severe narrowing at L4-5 and L5-S1. L1-2 negative. L2-3: Minimal disc bulging and mild facet hypertrophy without stenosis. L3-4: Circumferential disc bulging greater to the left and mild right and moderate left facet hypertrophy result in moderate left lateral recess stenosis and mild-to-moderate left neural foraminal stenosis, unchanged. Potential left L4 nerve root impingement. Mild spinal stenosis with prominent dorsal epidural fat contributing. L4-5: Circumferential disc bulging, a left paracentral to left foraminal disc extrusion with superior migration, and moderate facet hypertrophy result in severe spinal stenosis, severe left greater than right lateral recess stenosis, and severe left neural foraminal stenosis with potential bilateral L5 and left L4 nerve root impingement. The disc extrusion has enlarged with new left foraminal extension and worsening of left foraminal stenosis. L5-S1: Disc bulging, disc space height loss, and moderate facet hypertrophy result in moderate bilateral lateral recess stenosis and severe right and mild left neural foraminal stenosis, unchanged. Potential right L5 and bilateral S1 nerve root impingement. IMPRESSION: 1. Interval enlargement of an L4-5 disc extrusion with increased severe left neural foraminal stenosis. Chronic severe spinal and bilateral lateral recess stenosis. 2. Unchanged moderate lateral recess and severe right neural foraminal stenosis at L5-S1. 3. Unchanged moderate left lateral recess stenosis at L3-4. Electronically Signed   By: Logan Bores M.D.   On: 01/28/2019 12:55   Dg Lumbar Spine 1 View  Result Date:  01/28/2019 CLINICAL DATA:  Lumbar surgery EXAM: LUMBAR SPINE - 1 VIEW COMPARISON:  None. FINDINGS: Single lateral intraoperative view demonstrates skin spreaders posterior to the L5 vertebral body. IMPRESSION: Single lateral intraoperative radiograph as above. Electronically Signed   By: Suzy Bouchard M.D.   On: 01/28/2019  20:43   Dg C-arm 1-60 Min  Result Date: 01/28/2019 CLINICAL DATA:  Posterior lumbar fusion EXAM: LUMBAR SPINE - 2-3 VIEW; DG C-ARM 61-120 MIN COMPARISON:  MRI 01/28/2019 FINDINGS: Two low resolution intraoperative spot views of the lumbar spine. Total fluoroscopy time was 24 seconds. Posterior spinal rods and fixating screws with interbody device at L4-L5. IMPRESSION: Intraoperative fluoroscopic assistance provided during lumbar fusion surgery Electronically Signed   By: Donavan Foil M.D.   On: 01/28/2019 20:55     Subjective: No major events overnight of this morning.  Anxiety improved.  Denies chest pain, dyspnea, nausea, vomiting or abdominal pain.   Discharge Exam: Vitals:   02/22/19 0412 02/22/19 0536  BP: (!) 158/66 (!) 151/65  Pulse: 91   Resp:    Temp: 98.3 F (36.8 C)   SpO2: 100%     GENERAL: No acute distress.  Appears well.  HEENT: MMM.  Vision and hearing grossly intact.  NECK: Supple.  No JVD.  LUNGS:  No IWOB. Good air movement bilaterally. HEART:  RRR. Heart sounds normal.  ABD: Bowel sounds present. Soft. Non tender.  MSK/EXT:  Moves all extremities. No apparent deformity. No edema bilaterally.  SKIN: no apparent skin lesion or wound NEURO: Awake, alert and oriented appropriately.  No gross deficit except for some weakness in both legs. PSYCH: Calm. Normal affect.    The results of significant diagnostics from this hospitalization (including imaging, microbiology, ancillary and laboratory) are listed below for reference.     Microbiology: Recent Results (from the past 240 hour(s))  Urine culture     Status: None   Collection Time: 02/14/19  8:33 PM   Specimen: Urine, Random  Result Value Ref Range Status   Specimen Description URINE, RANDOM  Final   Special Requests NONE  Final   Culture   Final    NO GROWTH Performed at [facility], [address], [name], [place] [address]    Report Status 02/16/2019 FINAL  Final  SARS Coronavirus 2      Status: None   Collection Time: 02/15/19 12:24 AM  Result Value Ref Range Status   SARS Coronavirus 2 NOT DETECTED NOT DETECTED Final    Comment: (NOTE) SARS-CoV-2 target nucleic acids are NOT DETECTED. The SARS-CoV-2 RNA is generally detectable in upper and lower respiratory specimens during the acute phase of infection.  Negative  results do not preclude SARS-CoV-2 infection, do not rule out co-infections with other pathogens, and should not be used as the sole basis for treatment or other patient management decisions.  Negative results must be combined with clinical observations, patient history, and epidemiological information. The expected result is Not Detected. Fact Sheet for Patients: http://www.biofiredefense.com/wp-content/uploads/2020/03/BIOFIRE-COVID -19-patients.pdf Fact Sheet for Healthcare Providers: http://www.biofiredefense.com/wp-content/uploads/2020/03/BIOFIRE-COVID -19-hcp.pdf This test is not yet approved or cleared by the [place] and  has been authorized for detection and/or diagnosis of SARS-CoV-2 by FDA under an Emergency Use Authorization (EUA).  This EUA will remain in effec t (meaning this test can be used) for the duration of  the COVID-19 declaration under Section 564(b)(1) of the Act, 21 U.S.C. section 360bbb-3(b)(1), unless the authorization is terminated or revoked sooner. Performed at [facility],  [address], [place], [place] [address]      Labs: BNP (last 3 results) Recent Labs    02/19/19 0738  BNP 408.1*   Basic Metabolic Panel: Recent Labs  Lab 02/19/19 0738 02/19/19 1507 02/20/19 0638 02/21/19 0559 02/22/19 0630  NA 125* 123* 126* 125* 126*  K 4.4 4.5 4.4 4.4 3.8  CL 95* 95* 99 99 97*  CO2 21* 20* 21* 21* 21*  GLUCOSE 106* 152* 106* 111* 86  BUN 25* 27* 23 24* 24*  CREATININE 1.05* 1.37* 1.00 1.15* 1.06*  CALCIUM 9.1 8.7* 8.6* 8.3* 8.5*  MG  --   --  1.9 1.8 1.8   Liver Function Tests: No results for  input(s): AST, ALT, ALKPHOS, BILITOT, PROT, ALBUMIN in the last 168 hours. No results for input(s): LIPASE, AMYLASE in the last 168 hours. No results for input(s): AMMONIA in the last 168 hours. CBC: Recent Labs  Lab 02/16/19 0301  WBC 5.8  HGB 8.0*  HCT 22.1*  MCV 100.0  PLT 188   Cardiac Enzymes: No results for input(s): CKTOTAL, CKMB, CKMBINDEX, TROPONINI in the last 168 hours. BNP: Invalid input(s): POCBNP CBG: No results for input(s): GLUCAP in the last 168 hours. D-Dimer No results for input(s): DDIMER in the last 72 hours. Hgb A1c No results for input(s): HGBA1C in the last 72 hours. Lipid Profile No results for input(s): CHOL, HDL, LDLCALC, TRIG, CHOLHDL, LDLDIRECT in the last 72 hours. Thyroid function studies No results for input(s): TSH, T4TOTAL, T3FREE, THYROIDAB in the last 72 hours.  Invalid input(s): FREET3 Anemia work up No results for input(s): VITAMINB12, FOLATE, FERRITIN, TIBC, IRON, RETICCTPCT in the last 72 hours. Urinalysis    Component Value Date/Time   COLORURINE YELLOW 02/14/2019 2141   APPEARANCEUR CLEAR 02/14/2019 2141   LABSPEC 1.008 02/14/2019 2141   PHURINE 7.0 02/14/2019 2141   GLUCOSEU NEGATIVE 02/14/2019 2141   HGBUR NEGATIVE 02/14/2019 2141   BILIRUBINUR NEGATIVE 02/14/2019 2141   KETONESUR NEGATIVE 02/14/2019 2141   PROTEINUR 30 (A) 02/14/2019 2141   UROBILINOGEN 0.2 09/22/2012 1100   NITRITE NEGATIVE 02/14/2019 2141   LEUKOCYTESUR NEGATIVE 02/14/2019 2141   Sepsis Labs Invalid input(s): PROCALCITONIN,  WBC,  LACTICIDVEN   Time coordinating discharge: 35 minutes  SIGNED:  Mercy Riding, MD  [facility] 02/22/2019, 8:50 PM  If 7PM-7AM, please contact night-coverage [email or web address] Password [email or web address]

## 2019-02-22 NOTE — Discharge Instructions (Signed)
Acute Urinary Retention, Female  Acute urinary retention means that you cannot pee (urinate) at all, or that you pee too little and your bladder is not emptied completely. If it is not treated, it can lead to kidney damage or other serious problems. Follow these instructions at home:  Take over-the-counter and prescription medicines only as told by your doctor. Ask your doctor what medicines you should stay away from. Do not take any medicine unless your doctor says it is okay to do so.  If you were sent home with a tube that drains pee from the bladder (catheter), take care of it as told by your doctor.  Drink enough fluid to keep your pee clear or pale yellow.  If you were given an antibiotic, take it as told by your doctor. Do not stop taking the antibiotic even if you start to feel better.  Do not use any products that contain nicotine or tobacco, such as cigarettes and e-cigarettes. If you need help quitting, ask your doctor.  Watch for changes in your symptoms. Tell your doctor about them.  If told, keep track of any changes in your blood pressure at home. Tell your doctor about them.  Keep all follow-up visits as told by your doctor. This is important. Contact a doctor if:  You have spasms or you leak pee when you have spasms. Get help right away if:  You have chills or a fever.  You have blood in your pee.  You have a tube that drains the bladder and: ? The tube stops draining pee. ? The tube falls out. Summary  Acute urinary retention means that you cannot pee at all, or that you pee too little and your bladder is not emptied completely. If it is not treated, it can result in kidney damage or other serious problems.  If you were sent home with a tube that drains pee from the bladder, take care of it as told by your doctor.  Pay attention to any changes in your symptoms. Tell your doctor about them. This information is not intended to replace advice given to you by your  health care provider. Make sure you discuss any questions you have with your health care provider. Document Released: 02/04/2008 Document Revised: 09/19/2016 Document Reviewed: 09/19/2016 Elsevier Interactive Patient Education  2019 [place].   Indwelling Urinary Catheter Insertion      For people with certain conditions, urine is not able to move normally through the part of the body that drains urine from the bladder (urethra). An indwelling urinary catheter is a thin, sterile tube (catheter) that is placed (inserted) into the bladder through the urethra to help drain urine out of the body. After the catheter is inserted, it is held in place by a small balloon on the catheter that is filled with sterile water. Urine drains from the catheter into a drainage bag outside of the body. An indwelling urinary catheter may be needed if you have urinary retention problems or bladder obstruction. It may also be needed during and after surgical procedures and for other medical conditions. Tell a health care provider about:  Any allergies you have.  Any surgeries you have had.  Any medical conditions you have.  Any blood disorders you have. What are the risks? Generally, this is a safe procedure. However, problems may occur, including:  Infection.  Bleeding.  Damage to other structures or organs. What happens before the procedure?  Your health care provider will inspect your urethra before  inserting the catheter. What happens during the procedure?  To lower your risk of infection: ? Your health care team will wash or sanitize their hands. ? Your skin will be washed with soap.  A lubricant will be placed on the catheter to ease the insertion of it into the urethra.  The catheter will be inserted into the urethra until you can see urine in the drainage bag. After the urine starts to flow, the catheter may be inserted another couple of inches (about 5 cm).  Sterile water will be used  to inflate the balloon to hold the catheter in place.  After the catheter balloon is inflated, it will be pulled back so it is against the narrow opening at the end of the bladder.  Your health care provider will check for urine flow into the drainage bag. The procedure may vary among health care providers and hospitals. What happens after the procedure?  Urine in the drainage bag will be emptied and measured by your health care provider while you are in the hospital.  Your health care provider will remove the catheter for you. This will be done when the catheter is no longer necessary, which is likely to be before you leave the hospital. Summary  An indwelling catheter is a sterile tube that is placed into the bladder through the urethra to help drain urine out of the body.  The catheter will be removed as soon as it is no longer necessary. This information is not intended to replace advice given to you by your health care provider. Make sure you discuss any questions you have with your health care provider. Document Released: 09/27/2016 Document Revised: 09/27/2016 Document Reviewed: 09/27/2016 Elsevier Interactive Patient Education  2019 [place].   Indwelling Urinary Catheter Insertion, Care After This sheet gives you information about how to care for yourself after your procedure. Your health care provider may also give you more specific instructions. If you have problems or questions, contact your health care provider. What can I expect after the procedure? After the procedure, it is common to have:  Slight discomfort around your urethra where the catheter enters your body. Follow these instructions at home:   Keep the drainage bag at or below the level of your bladder. Doing this ensures that urine can only drain out, not back into your body.  Secure the catheter tubing and drainage bag to your leg or thigh to keep it from moving.  Check the catheter tubing regularly to  make sure there are no kinks or blockages.  Take showers daily to keep the catheter clean. Do not take a bath.  Do not pull on your catheter or try to remove it.  Disconnect the tubing and drainage bag as little as possible.  Empty the drainage bag every 2-4 hours, or more often if needed. Do not let the bag get completely full.  Wash your hands with soap and water before and after touching the catheter, tubing, or drainage bag.  Do not let the drainage bag or catheter tubing touch the floor.  Drink enough fluids to keep your urine clear or pale yellow, or as told by your health care provider. Contact a health care provider if:  Urine stops flowing into the drainage bag.  You feel pain or pressure in the bladder area.  You have back pain.  Your catheter gets clogged.  Your catheter starts to leak.  Your urine looks cloudy.  Your drainage bag or tubing looks dirty.  You notice a bad smell when emptying your drainage bag. Get help right away if:  You have a fever or chills.  You have severe pain in your back or your lower abdomen.  You have warmth, redness, swelling, or pain in the urethra area.  You notice blood in your urine.  Your catheter gets pulled out. Summary  Do not pull on your catheter or try to remove it.  Keep the drainage bag at or below the level of your bladder, but do not let the drainage bag or catheter tubing touch the floor.  Wash your hands with soap and water before and after touching the catheter, tubing, or drainage bag.  Contact your health care provider if you have a fever, chills, or any other signs of infection. This information is not intended to replace advice given to you by your health care provider. Make sure you discuss any questions you have with your health care provider. Document Released: 09/27/2016 Document Revised: 09/27/2016 Document Reviewed: 09/27/2016 Elsevier Interactive Patient Education  2019 [employer].

## 2019-02-22 NOTE — Progress Notes (Signed)
OF note, patient was admitted and assessed with this spot and has stated numerous times she had had it a long while and does not do any special treatment for it. Unchanged from admission.  Simmie Davies RN

## 2019-02-22 NOTE — Progress Notes (Signed)
Pt will be discharged with Foley catheter in place, to be followed by [facility] and will return to urologist in ~3 weeks for reassessment. Foley care discussed, patient states she receive education and instruction and was able to verbally demonstrated understanding.  Simmie Davies RN

## 2019-02-22 NOTE — Progress Notes (Signed)
Pt declined any immunization at this time. Simmie Davies RN

## 2019-02-22 NOTE — Progress Notes (Signed)
Educated pt on foley care. Explained when to empty. Showed her how to unclamp and empty the foley. Explained to pt how to keep the foley clean and the importance of cleaning the foley. Explained that bag needed to be below the bladder and line free of loops and kinks.

## 2019-02-22 NOTE — Evaluation (Signed)
Occupational Therapy Evaluation Patient Details Name: Regina Ortiz MRN: [id number] DOB: June 29, 1946 Today's Date: 02/22/2019    History of Present Illness 73 y.o. female admitted on 02/14/19 for urinary retention, perineal hypoesthesia, lumbosacral radiculopathy, hyponatremia, constipation.  Pt with significant PMH of SOB with exertion, LBBB, HTN, COPD, anxiety, L4/5 decompression and fusion on 01/28/19 with d/c to [facility] SNF x 8 days, then home.     Clinical Impression   Patient presenting with decreased I in self care, balance, functional mobility/transfers, endurance, strength, knowledge of back precautions, and safety awareness. Patient reports being independent PTA. Patient currently functioning at close supervision - min A with cuing to maintain precautions. Patient will benefit from acute OT to increase overall independence in the areas of ADLs, functional mobility, and safety awareness in order to safely discharge home with assist.    Follow Up Recommendations  Home health OT;Supervision - Intermittent    Equipment Recommendations  3 in 1 bedside commode       Precautions / Restrictions Precautions Precautions: Fall;Back Precaution Comments: pt able to recall 3/3 back precautions but con't to require min verbal cues to adhere to them functionally Required Braces or Orthoses: Spinal Brace Spinal Brace: Lumbar corset;Applied in sitting position(brace is at home)      Mobility Bed Mobility Overal bed mobility: Needs Assistance Bed Mobility: Rolling;Sidelying to Sit;Sit to Sidelying Rolling: Supervision Sidelying to sit: Supervision     Sit to sidelying: Supervision General bed mobility comments: cuing to maintain precautions and hand placement  Transfers Overall transfer level: Needs assistance Equipment used: Rolling walker (2 wheeled) Transfers: Sit to/from Stand Sit to Stand: Supervision         General transfer comment: cuing for hand placement and safety     Balance Overall balance assessment: Needs assistance Sitting-balance support: Feet supported;No upper extremity supported Sitting balance-Leahy Scale: Good     Standing balance support: Bilateral upper extremity supported;Single extremity supported Standing balance-Leahy Scale: Fair Standing balance comment: reliance on RW for support           ADL either performed or assessed with clinical judgement   ADL Overall ADL's : Needs assistance/impaired     Grooming: Wash/dry hands;Wash/dry face;Oral care;Supervision/safety;Standing   Upper Body Bathing: Set up;Sitting   Lower Body Bathing: Minimal assistance;Sit to/from stand   Upper Body Dressing : Set up;Sitting   Lower Body Dressing: Minimal assistance;Sit to/from stand   Toilet Transfer: Chief Operating Officer;Ambulation   Toileting- Clothing Manipulation and Hygiene: Sit to/from stand;Cueing for sequencing;Cueing for safety;Supervision/safety       Functional mobility during ADLs: Rolling walker;Supervision/safety;Cueing for safety       Vision Baseline Vision/History: Wears glasses Wears Glasses: At all times Patient Visual Report: No change from baseline Vision Assessment?: No apparent visual deficits            Pertinent Vitals/Pain Pain Assessment: 0-10 Pain Score: 3  Pain Location: back Pain Descriptors / Indicators: Aching Pain Intervention(s): Monitored during session;Repositioned;Limited activity within patient's tolerance     Hand Dominance Right   Extremity/Trunk Assessment Upper Extremity Assessment Upper Extremity Assessment: Overall WFL for tasks assessed   Lower Extremity Assessment Lower Extremity Assessment: Defer to PT evaluation       Communication Communication Communication: No difficulties   Cognition Arousal/Alertness: Awake/alert Behavior During Therapy: WFL for tasks assessed/performed Overall Cognitive Status: Within Functional Limits for tasks  assessed Area of Impairment: Problem solving;Safety/judgement  Memory: Decreased short-term memory   Safety/Judgement: Decreased awareness of safety   Problem Solving: Slow processing;Requires verbal cues                Home Living Family/patient expects to be discharged to:: Private residence Living Arrangements: Alone Available Help at Discharge: Personal care attendant Type of Home: House Home Access: Stairs to enter [employer] of Steps: 2 Entrance Stairs-Rails: None Home Layout: Two level;1/2 bath on main level Alternate Level Stairs-Number of Steps: flight Alternate Level Stairs-Rails: Right Bathroom Shower/Tub: Teacher, early years/pre: Standard     Home Equipment: Environmental consultant - 2 wheels          Prior Functioning/Environment Level of Independence: Independent with assistive device(s)        Comments: sponge bathes, used RW when she got home from SNF, but has not since.        OT Problem List: Decreased strength;Decreased activity tolerance;Impaired balance (sitting and/or standing);Decreased knowledge of use of DME or AE;Pain      OT Treatment/Interventions: Self-care/ADL training;DME and/or AE instruction;Patient/family education;Balance training;Therapeutic activities;Energy conservation    OT Goals(Current goals can be found in the care plan section) Acute Rehab OT Goals Patient Stated Goal: to go home OT Goal Formulation: With patient Time For Goal Achievement: 03/08/19 Potential to Achieve Goals: Good  OT Frequency: Min 2X/week              AM-PAC OT "6 Clicks" Daily Activity     Outcome Measure Help from another person eating meals?: None Help from another person taking care of personal grooming?: A Little Help from another person toileting, which includes using toliet, bedpan, or urinal?: A Little Help from another person bathing (including washing, rinsing, drying)?: A Little Help from  another person to put on and taking off regular upper body clothing?: A Little Help from another person to put on and taking off regular lower body clothing?: A Little 6 Click Score: 19   End of Session Equipment Utilized During Treatment: Rolling walker Nurse Communication: Mobility status  Activity Tolerance: Patient tolerated treatment well Patient left: with call bell/phone within reach;in bed;with bed alarm set  OT Visit Diagnosis: Other abnormalities of gait and mobility (R26.89);Unsteadiness on feet (R26.81);Pain Pain - part of body: (back)                Time: 2683-4196 OT Time Calculation (min): 23 min Charges:  OT General Charges $OT Visit: 1 Visit OT Evaluation $OT Eval Low Complexity: 1 Low OT Treatments $Self Care/Home Management : 8-22 mins  Jaclene Bartelt P, MS,  OTR/L 02/22/2019, 9:29 AM

## 2019-02-22 NOTE — Progress Notes (Signed)
Pt to be discharged to home. Son notified and a neighbor is picking her up at 1230 pm today. Prescriptions are being provided by transitional care pharmacy at [facility]. Reviewed AVS with patient and copy placed in her purse. Noted the appointments that need to be followed up with and where the numbers are on the AVS. She states she has a way to appointments. Pt bathed and dressed, IV removed by NT. Foley intact and secure. Simmie Davies RN

## 2019-02-23 DIAGNOSIS — F329 Major depressive disorder, single episode, unspecified: Secondary | ICD-10-CM | POA: Diagnosis not present

## 2019-02-23 DIAGNOSIS — G834 Cauda equina syndrome: Secondary | ICD-10-CM | POA: Diagnosis not present

## 2019-02-23 DIAGNOSIS — M4807 Spinal stenosis, lumbosacral region: Secondary | ICD-10-CM | POA: Diagnosis not present

## 2019-02-23 DIAGNOSIS — J449 Chronic obstructive pulmonary disease, unspecified: Secondary | ICD-10-CM | POA: Diagnosis not present

## 2019-02-23 DIAGNOSIS — E871 Hypo-osmolality and hyponatremia: Secondary | ICD-10-CM | POA: Diagnosis not present

## 2019-02-23 DIAGNOSIS — F419 Anxiety disorder, unspecified: Secondary | ICD-10-CM | POA: Diagnosis not present

## 2019-02-23 DIAGNOSIS — Z466 Encounter for fitting and adjustment of urinary device: Secondary | ICD-10-CM | POA: Diagnosis not present

## 2019-02-23 DIAGNOSIS — M19042 Primary osteoarthritis, left hand: Secondary | ICD-10-CM | POA: Diagnosis not present

## 2019-02-23 DIAGNOSIS — M810 Age-related osteoporosis without current pathological fracture: Secondary | ICD-10-CM | POA: Diagnosis not present

## 2019-02-23 DIAGNOSIS — K219 Gastro-esophageal reflux disease without esophagitis: Secondary | ICD-10-CM | POA: Diagnosis not present

## 2019-02-23 DIAGNOSIS — M0600 Rheumatoid arthritis without rheumatoid factor, unspecified site: Secondary | ICD-10-CM | POA: Diagnosis not present

## 2019-02-23 DIAGNOSIS — M96840 Postprocedural hematoma of a musculoskeletal structure following a musculoskeletal system procedure: Secondary | ICD-10-CM | POA: Diagnosis not present

## 2019-02-23 DIAGNOSIS — M19041 Primary osteoarthritis, right hand: Secondary | ICD-10-CM | POA: Diagnosis not present

## 2019-02-23 DIAGNOSIS — D7589 Other specified diseases of blood and blood-forming organs: Secondary | ICD-10-CM | POA: Diagnosis not present

## 2019-02-23 DIAGNOSIS — I447 Left bundle-branch block, unspecified: Secondary | ICD-10-CM | POA: Diagnosis not present

## 2019-02-23 DIAGNOSIS — I700 Atherosclerosis of aorta: Secondary | ICD-10-CM | POA: Diagnosis not present

## 2019-02-23 DIAGNOSIS — L89151 Pressure ulcer of sacral region, stage 1: Secondary | ICD-10-CM | POA: Diagnosis not present

## 2019-02-23 DIAGNOSIS — M8938 Hypertrophy of bone, other site: Secondary | ICD-10-CM | POA: Diagnosis not present

## 2019-02-23 DIAGNOSIS — M19039 Primary osteoarthritis, unspecified wrist: Secondary | ICD-10-CM | POA: Diagnosis not present

## 2019-02-23 DIAGNOSIS — R339 Retention of urine, unspecified: Secondary | ICD-10-CM | POA: Diagnosis not present

## 2019-02-23 DIAGNOSIS — K5900 Constipation, unspecified: Secondary | ICD-10-CM | POA: Diagnosis not present

## 2019-02-23 DIAGNOSIS — D649 Anemia, unspecified: Secondary | ICD-10-CM | POA: Diagnosis not present

## 2019-02-23 DIAGNOSIS — M5117 Intervertebral disc disorders with radiculopathy, lumbosacral region: Secondary | ICD-10-CM | POA: Diagnosis not present

## 2019-02-23 DIAGNOSIS — I872 Venous insufficiency (chronic) (peripheral): Secondary | ICD-10-CM | POA: Diagnosis not present

## 2019-02-23 DIAGNOSIS — I10 Essential (primary) hypertension: Secondary | ICD-10-CM | POA: Diagnosis not present

## 2019-03-02 DIAGNOSIS — M5126 Other intervertebral disc displacement, lumbar region: Secondary | ICD-10-CM | POA: Diagnosis not present

## 2019-03-02 DIAGNOSIS — Z09 Encounter for follow-up examination after completed treatment for conditions other than malignant neoplasm: Secondary | ICD-10-CM | POA: Diagnosis not present

## 2019-03-02 DIAGNOSIS — Z79899 Other long term (current) drug therapy: Secondary | ICD-10-CM | POA: Diagnosis not present

## 2019-03-02 DIAGNOSIS — Z5181 Encounter for therapeutic drug level monitoring: Secondary | ICD-10-CM | POA: Diagnosis not present

## 2019-03-03 DIAGNOSIS — F419 Anxiety disorder, unspecified: Secondary | ICD-10-CM | POA: Diagnosis not present

## 2019-03-03 DIAGNOSIS — M0600 Rheumatoid arthritis without rheumatoid factor, unspecified site: Secondary | ICD-10-CM | POA: Diagnosis not present

## 2019-03-03 DIAGNOSIS — M810 Age-related osteoporosis without current pathological fracture: Secondary | ICD-10-CM | POA: Diagnosis not present

## 2019-03-03 DIAGNOSIS — M19039 Primary osteoarthritis, unspecified wrist: Secondary | ICD-10-CM | POA: Diagnosis not present

## 2019-03-03 DIAGNOSIS — L89151 Pressure ulcer of sacral region, stage 1: Secondary | ICD-10-CM | POA: Diagnosis not present

## 2019-03-03 DIAGNOSIS — D649 Anemia, unspecified: Secondary | ICD-10-CM | POA: Diagnosis not present

## 2019-03-03 DIAGNOSIS — I10 Essential (primary) hypertension: Secondary | ICD-10-CM | POA: Diagnosis not present

## 2019-03-03 DIAGNOSIS — M19042 Primary osteoarthritis, left hand: Secondary | ICD-10-CM | POA: Diagnosis not present

## 2019-03-03 DIAGNOSIS — M19041 Primary osteoarthritis, right hand: Secondary | ICD-10-CM | POA: Diagnosis not present

## 2019-03-03 DIAGNOSIS — J449 Chronic obstructive pulmonary disease, unspecified: Secondary | ICD-10-CM | POA: Diagnosis not present

## 2019-03-03 DIAGNOSIS — K219 Gastro-esophageal reflux disease without esophagitis: Secondary | ICD-10-CM | POA: Diagnosis not present

## 2019-03-03 DIAGNOSIS — I700 Atherosclerosis of aorta: Secondary | ICD-10-CM | POA: Diagnosis not present

## 2019-03-03 DIAGNOSIS — M4807 Spinal stenosis, lumbosacral region: Secondary | ICD-10-CM | POA: Diagnosis not present

## 2019-03-03 DIAGNOSIS — I872 Venous insufficiency (chronic) (peripheral): Secondary | ICD-10-CM | POA: Diagnosis not present

## 2019-03-03 DIAGNOSIS — M8938 Hypertrophy of bone, other site: Secondary | ICD-10-CM | POA: Diagnosis not present

## 2019-03-03 DIAGNOSIS — R339 Retention of urine, unspecified: Secondary | ICD-10-CM | POA: Diagnosis not present

## 2019-03-03 DIAGNOSIS — K5900 Constipation, unspecified: Secondary | ICD-10-CM | POA: Diagnosis not present

## 2019-03-03 DIAGNOSIS — D7589 Other specified diseases of blood and blood-forming organs: Secondary | ICD-10-CM | POA: Diagnosis not present

## 2019-03-03 DIAGNOSIS — Z466 Encounter for fitting and adjustment of urinary device: Secondary | ICD-10-CM | POA: Diagnosis not present

## 2019-03-03 DIAGNOSIS — E871 Hypo-osmolality and hyponatremia: Secondary | ICD-10-CM | POA: Diagnosis not present

## 2019-03-03 DIAGNOSIS — M96840 Postprocedural hematoma of a musculoskeletal structure following a musculoskeletal system procedure: Secondary | ICD-10-CM | POA: Diagnosis not present

## 2019-03-03 DIAGNOSIS — M5117 Intervertebral disc disorders with radiculopathy, lumbosacral region: Secondary | ICD-10-CM | POA: Diagnosis not present

## 2019-03-03 DIAGNOSIS — F329 Major depressive disorder, single episode, unspecified: Secondary | ICD-10-CM | POA: Diagnosis not present

## 2019-03-03 DIAGNOSIS — G834 Cauda equina syndrome: Secondary | ICD-10-CM | POA: Diagnosis not present

## 2019-03-03 DIAGNOSIS — I447 Left bundle-branch block, unspecified: Secondary | ICD-10-CM | POA: Diagnosis not present

## 2019-03-12 ENCOUNTER — Emergency Department (HOSPITAL_COMMUNITY): Payer: PPO

## 2019-03-12 ENCOUNTER — Observation Stay (HOSPITAL_COMMUNITY): Payer: PPO

## 2019-03-12 ENCOUNTER — Inpatient Hospital Stay (HOSPITAL_COMMUNITY)
Admission: EM | Admit: 2019-03-12 | Discharge: 2019-03-19 | DRG: 481 | Disposition: A | Discharge status: Skilled Nursing Facility | Payer: PPO | Attending: Internal Medicine | Admitting: Internal Medicine

## 2019-03-12 ENCOUNTER — Other Ambulatory Visit: Payer: Self-pay

## 2019-03-12 DIAGNOSIS — N390 Urinary tract infection, site not specified: Secondary | ICD-10-CM | POA: Diagnosis present

## 2019-03-12 DIAGNOSIS — S0993XA Unspecified injury of face, initial encounter: Secondary | ICD-10-CM | POA: Diagnosis not present

## 2019-03-12 DIAGNOSIS — M5417 Radiculopathy, lumbosacral region: Secondary | ICD-10-CM

## 2019-03-12 DIAGNOSIS — S72011A Unspecified intracapsular fracture of right femur, initial encounter for closed fracture: Principal | ICD-10-CM | POA: Diagnosis present

## 2019-03-12 DIAGNOSIS — G8918 Other acute postprocedural pain: Secondary | ICD-10-CM | POA: Diagnosis not present

## 2019-03-12 DIAGNOSIS — F419 Anxiety disorder, unspecified: Secondary | ICD-10-CM | POA: Diagnosis present

## 2019-03-12 DIAGNOSIS — S51012A Laceration without foreign body of left elbow, initial encounter: Secondary | ICD-10-CM | POA: Diagnosis not present

## 2019-03-12 DIAGNOSIS — Z807 Family history of other malignant neoplasms of lymphoid, hematopoietic and related tissues: Secondary | ICD-10-CM

## 2019-03-12 DIAGNOSIS — Z7983 Long term (current) use of bisphosphonates: Secondary | ICD-10-CM

## 2019-03-12 DIAGNOSIS — M4807 Spinal stenosis, lumbosacral region: Secondary | ICD-10-CM | POA: Diagnosis not present

## 2019-03-12 DIAGNOSIS — R9431 Abnormal electrocardiogram [ECG] [EKG]: Secondary | ICD-10-CM | POA: Diagnosis present

## 2019-03-12 DIAGNOSIS — R102 Pelvic and perineal pain: Secondary | ICD-10-CM | POA: Diagnosis not present

## 2019-03-12 DIAGNOSIS — Z8249 Family history of ischemic heart disease and other diseases of the circulatory system: Secondary | ICD-10-CM

## 2019-03-12 DIAGNOSIS — R51 Headache: Secondary | ICD-10-CM | POA: Diagnosis present

## 2019-03-12 DIAGNOSIS — L8990 Pressure ulcer of unspecified site, unspecified stage: Secondary | ICD-10-CM | POA: Insufficient documentation

## 2019-03-12 DIAGNOSIS — M0600 Rheumatoid arthritis without rheumatoid factor, unspecified site: Secondary | ICD-10-CM | POA: Diagnosis present

## 2019-03-12 DIAGNOSIS — M2550 Pain in unspecified joint: Secondary | ICD-10-CM | POA: Diagnosis not present

## 2019-03-12 DIAGNOSIS — I10 Essential (primary) hypertension: Secondary | ICD-10-CM | POA: Diagnosis not present

## 2019-03-12 DIAGNOSIS — R338 Other retention of urine: Secondary | ICD-10-CM

## 2019-03-12 DIAGNOSIS — E785 Hyperlipidemia, unspecified: Secondary | ICD-10-CM | POA: Diagnosis present

## 2019-03-12 DIAGNOSIS — W010XXA Fall on same level from slipping, tripping and stumbling without subsequent striking against object, initial encounter: Secondary | ICD-10-CM | POA: Diagnosis present

## 2019-03-12 DIAGNOSIS — J449 Chronic obstructive pulmonary disease, unspecified: Secondary | ICD-10-CM | POA: Diagnosis present

## 2019-03-12 DIAGNOSIS — S51011A Laceration without foreign body of right elbow, initial encounter: Secondary | ICD-10-CM | POA: Diagnosis not present

## 2019-03-12 DIAGNOSIS — F418 Other specified anxiety disorders: Secondary | ICD-10-CM | POA: Diagnosis not present

## 2019-03-12 DIAGNOSIS — Z981 Arthrodesis status: Secondary | ICD-10-CM

## 2019-03-12 DIAGNOSIS — W19XXXA Unspecified fall, initial encounter: Secondary | ICD-10-CM

## 2019-03-12 DIAGNOSIS — J441 Chronic obstructive pulmonary disease with (acute) exacerbation: Secondary | ICD-10-CM | POA: Diagnosis not present

## 2019-03-12 DIAGNOSIS — M6281 Muscle weakness (generalized): Secondary | ICD-10-CM | POA: Diagnosis not present

## 2019-03-12 DIAGNOSIS — R531 Weakness: Secondary | ICD-10-CM | POA: Diagnosis not present

## 2019-03-12 DIAGNOSIS — Z833 Family history of diabetes mellitus: Secondary | ICD-10-CM

## 2019-03-12 DIAGNOSIS — S72001D Fracture of unspecified part of neck of right femur, subsequent encounter for closed fracture with routine healing: Secondary | ICD-10-CM | POA: Diagnosis not present

## 2019-03-12 DIAGNOSIS — I5032 Chronic diastolic (congestive) heart failure: Secondary | ICD-10-CM | POA: Diagnosis present

## 2019-03-12 DIAGNOSIS — S80812A Abrasion, left lower leg, initial encounter: Secondary | ICD-10-CM | POA: Diagnosis present

## 2019-03-12 DIAGNOSIS — R339 Retention of urine, unspecified: Secondary | ICD-10-CM | POA: Diagnosis present

## 2019-03-12 DIAGNOSIS — S59901A Unspecified injury of right elbow, initial encounter: Secondary | ICD-10-CM | POA: Diagnosis not present

## 2019-03-12 DIAGNOSIS — M48061 Spinal stenosis, lumbar region without neurogenic claudication: Secondary | ICD-10-CM | POA: Diagnosis not present

## 2019-03-12 DIAGNOSIS — S72001A Fracture of unspecified part of neck of right femur, initial encounter for closed fracture: Secondary | ICD-10-CM

## 2019-03-12 DIAGNOSIS — S80811A Abrasion, right lower leg, initial encounter: Secondary | ICD-10-CM | POA: Diagnosis present

## 2019-03-12 DIAGNOSIS — E871 Hypo-osmolality and hyponatremia: Secondary | ICD-10-CM | POA: Diagnosis present

## 2019-03-12 DIAGNOSIS — M5416 Radiculopathy, lumbar region: Secondary | ICD-10-CM | POA: Diagnosis not present

## 2019-03-12 DIAGNOSIS — D62 Acute posthemorrhagic anemia: Secondary | ICD-10-CM | POA: Diagnosis not present

## 2019-03-12 DIAGNOSIS — T148XXA Other injury of unspecified body region, initial encounter: Secondary | ICD-10-CM

## 2019-03-12 DIAGNOSIS — Z419 Encounter for procedure for purposes other than remedying health state, unspecified: Secondary | ICD-10-CM

## 2019-03-12 DIAGNOSIS — Z7952 Long term (current) use of systemic steroids: Secondary | ICD-10-CM

## 2019-03-12 DIAGNOSIS — F17210 Nicotine dependence, cigarettes, uncomplicated: Secondary | ICD-10-CM | POA: Diagnosis present

## 2019-03-12 DIAGNOSIS — M810 Age-related osteoporosis without current pathological fracture: Secondary | ICD-10-CM | POA: Diagnosis present

## 2019-03-12 DIAGNOSIS — K5900 Constipation, unspecified: Secondary | ICD-10-CM

## 2019-03-12 DIAGNOSIS — M5126 Other intervertebral disc displacement, lumbar region: Secondary | ICD-10-CM | POA: Diagnosis present

## 2019-03-12 DIAGNOSIS — Z9181 History of falling: Secondary | ICD-10-CM | POA: Diagnosis not present

## 2019-03-12 DIAGNOSIS — D539 Nutritional anemia, unspecified: Secondary | ICD-10-CM | POA: Diagnosis present

## 2019-03-12 DIAGNOSIS — D500 Iron deficiency anemia secondary to blood loss (chronic): Secondary | ICD-10-CM | POA: Diagnosis not present

## 2019-03-12 DIAGNOSIS — E876 Hypokalemia: Secondary | ICD-10-CM | POA: Diagnosis present

## 2019-03-12 DIAGNOSIS — Y92009 Unspecified place in unspecified non-institutional (private) residence as the place of occurrence of the external cause: Secondary | ICD-10-CM

## 2019-03-12 DIAGNOSIS — S199XXA Unspecified injury of neck, initial encounter: Secondary | ICD-10-CM | POA: Diagnosis not present

## 2019-03-12 DIAGNOSIS — F329 Major depressive disorder, single episode, unspecified: Secondary | ICD-10-CM | POA: Diagnosis not present

## 2019-03-12 DIAGNOSIS — G834 Cauda equina syndrome: Secondary | ICD-10-CM | POA: Diagnosis present

## 2019-03-12 DIAGNOSIS — M069 Rheumatoid arthritis, unspecified: Secondary | ICD-10-CM

## 2019-03-12 DIAGNOSIS — M545 Low back pain: Secondary | ICD-10-CM | POA: Diagnosis not present

## 2019-03-12 DIAGNOSIS — S3992XA Unspecified injury of lower back, initial encounter: Secondary | ICD-10-CM | POA: Diagnosis not present

## 2019-03-12 DIAGNOSIS — I110 Hypertensive heart disease with heart failure: Secondary | ICD-10-CM | POA: Diagnosis present

## 2019-03-12 DIAGNOSIS — S5012XA Contusion of left forearm, initial encounter: Secondary | ICD-10-CM | POA: Diagnosis present

## 2019-03-12 DIAGNOSIS — S0011XA Contusion of right eyelid and periocular area, initial encounter: Secondary | ICD-10-CM | POA: Diagnosis present

## 2019-03-12 DIAGNOSIS — R52 Pain, unspecified: Secondary | ICD-10-CM

## 2019-03-12 DIAGNOSIS — D649 Anemia, unspecified: Secondary | ICD-10-CM

## 2019-03-12 DIAGNOSIS — Z7401 Bed confinement status: Secondary | ICD-10-CM | POA: Diagnosis not present

## 2019-03-12 DIAGNOSIS — R41841 Cognitive communication deficit: Secondary | ICD-10-CM | POA: Diagnosis not present

## 2019-03-12 DIAGNOSIS — R296 Repeated falls: Secondary | ICD-10-CM | POA: Diagnosis not present

## 2019-03-12 DIAGNOSIS — S5011XA Contusion of right forearm, initial encounter: Secondary | ICD-10-CM | POA: Diagnosis present

## 2019-03-12 DIAGNOSIS — K219 Gastro-esophageal reflux disease without esophagitis: Secondary | ICD-10-CM | POA: Diagnosis not present

## 2019-03-12 DIAGNOSIS — Z1159 Encounter for screening for other viral diseases: Secondary | ICD-10-CM

## 2019-03-12 DIAGNOSIS — S3993XA Unspecified injury of pelvis, initial encounter: Secondary | ICD-10-CM | POA: Diagnosis not present

## 2019-03-12 DIAGNOSIS — R54 Age-related physical debility: Secondary | ICD-10-CM | POA: Diagnosis present

## 2019-03-12 DIAGNOSIS — B9620 Unspecified Escherichia coli [E. coli] as the cause of diseases classified elsewhere: Secondary | ICD-10-CM | POA: Diagnosis present

## 2019-03-12 DIAGNOSIS — S59902A Unspecified injury of left elbow, initial encounter: Secondary | ICD-10-CM | POA: Diagnosis not present

## 2019-03-12 DIAGNOSIS — R5381 Other malaise: Secondary | ICD-10-CM | POA: Diagnosis not present

## 2019-03-12 DIAGNOSIS — S0990XA Unspecified injury of head, initial encounter: Secondary | ICD-10-CM | POA: Diagnosis not present

## 2019-03-12 DIAGNOSIS — S299XXA Unspecified injury of thorax, initial encounter: Secondary | ICD-10-CM | POA: Diagnosis not present

## 2019-03-12 LAB — BASIC METABOLIC PANEL
Anion gap: 11 (ref 5–15)
Anion gap: 13 (ref 5–15)
BUN: 15 mg/dL (ref 8–23)
BUN: 12 mg/dL (ref 8–23)
CO2: 22 mmol/L (ref 22–32)
CO2: 21 mmol/L — ABNORMAL LOW (ref 22–32)
Calcium: 8.4 mg/dL — ABNORMAL LOW (ref 8.9–10.3)
Calcium: 8.4 mg/dL — ABNORMAL LOW (ref 8.9–10.3)
Chloride: 85 mmol/L — ABNORMAL LOW (ref 98–111)
Chloride: 86 mmol/L — ABNORMAL LOW (ref 98–111)
Creatinine, Ser: 0.99 mg/dL (ref 0.44–1.00)
Creatinine, Ser: 0.85 mg/dL (ref 0.44–1.00)
GFR calc Af Amer: >60 mL/min (ref >60)
GFR calc Af Amer: >60 mL/min (ref >60)
GFR calc non Af Amer: 56 mL/min — ABNORMAL LOW (ref >60)
GFR calc non Af Amer: >60 mL/min (ref >60)
Glucose, Bld: 99 mg/dL (ref 70–99)
Glucose, Bld: 101 mg/dL — ABNORMAL HIGH (ref 70–99)
Potassium: 3.3 mmol/L — ABNORMAL LOW (ref 3.5–5.1)
Potassium: 3.3 mmol/L — ABNORMAL LOW (ref 3.5–5.1)
Sodium: 118 mmol/L — CL (ref 135–145)
Sodium: 120 mmol/L — ABNORMAL LOW (ref 135–145)

## 2019-03-12 LAB — URINALYSIS, ROUTINE W REFLEX MICROSCOPIC
Bilirubin Urine: NEGATIVE
Glucose, UA: NEGATIVE mg/dL
Hgb urine dipstick: NEGATIVE
Ketones, ur: NEGATIVE mg/dL
Nitrite: POSITIVE — AB
Protein, ur: 30 mg/dL — AB
Specific Gravity, Urine: 1.003 — ABNORMAL LOW (ref 1.005–1.030)
pH: 7 (ref 5.0–8.0)

## 2019-03-12 LAB — CBC WITH DIFFERENTIAL/PLATELET
Abs Immature Granulocytes: 0.12 10*3/uL — ABNORMAL HIGH (ref 0.00–0.07)
Basophils Absolute: 0 10*3/uL (ref 0.0–0.1)
Basophils Relative: 0 %
Eosinophils Absolute: 0 10*3/uL (ref 0.0–0.5)
Eosinophils Relative: 0 %
HCT: 29.8 % — ABNORMAL LOW (ref 36.0–46.0)
Hemoglobin: 10.8 g/dL — ABNORMAL LOW (ref 12.0–15.0)
Immature Granulocytes: 1 %
Lymphocytes Relative: 9 %
Lymphs Abs: 1.1 10*3/uL (ref 0.7–4.0)
MCH: 36.5 pg — ABNORMAL HIGH (ref 26.0–34.0)
MCHC: 36.2 g/dL — ABNORMAL HIGH (ref 30.0–36.0)
MCV: 100.7 fL — ABNORMAL HIGH (ref 80.0–100.0)
Monocytes Absolute: 0.8 10*3/uL (ref 0.1–1.0)
Monocytes Relative: 7 %
Neutro Abs: 9.9 10*3/uL — ABNORMAL HIGH (ref 1.7–7.7)
Neutrophils Relative %: 83 %
Platelets: 225 10*3/uL (ref 150–400)
RBC: 2.96 MIL/uL — ABNORMAL LOW (ref 3.87–5.11)
RDW: 15 % (ref 11.5–15.5)
WBC: 11.9 10*3/uL — ABNORMAL HIGH (ref 4.0–10.5)
nRBC: 0 % (ref 0.0–0.2)

## 2019-03-12 LAB — OSMOLALITY: Osmolality: 248 mOsm/kg — CL (ref 275–295)

## 2019-03-12 LAB — OSMOLALITY, URINE: Osmolality, Ur: 100 mOsm/kg — ABNORMAL LOW (ref 300–900)

## 2019-03-12 LAB — SODIUM, URINE, RANDOM: Sodium, Ur: <10 mmol/L

## 2019-03-12 LAB — SARS CORONAVIRUS 2 BY RT PCR (HOSPITAL ORDER, PERFORMED IN ~~LOC~~ HOSPITAL LAB): SARS Coronavirus 2: NEGATIVE

## 2019-03-12 MED ORDER — SENNOSIDES-DOCUSATE SODIUM 8.6-50 MG PO TABS
1.0000 | ORAL_TABLET | Freq: Two times a day (BID) | ORAL | Status: DC
  Administered 2019-03-12 – 2019-03-16 (×10): 1 via ORAL
  Filled 2019-03-12 (×13): qty 1

## 2019-03-12 MED ORDER — SODIUM CHLORIDE 0.9% FLUSH
3.0000 mL | Freq: Two times a day (BID) | INTRAVENOUS | Status: DC
  Administered 2019-03-12 – 2019-03-19 (×10): 3 mL via INTRAVENOUS

## 2019-03-12 MED ORDER — MORPHINE SULFATE (PF) 2 MG/ML IV SOLN
2.0000 mg | INTRAVENOUS | Status: DC | PRN
  Administered 2019-03-12 – 2019-03-13 (×3): 2 mg via INTRAVENOUS
  Filled 2019-03-12 (×3): qty 1

## 2019-03-12 MED ORDER — GABAPENTIN 300 MG PO CAPS
300.0000 mg | ORAL_CAPSULE | Freq: Two times a day (BID) | ORAL | Status: DC
  Administered 2019-03-12 – 2019-03-13 (×4): 300 mg via ORAL
  Filled 2019-03-12 (×4): qty 1

## 2019-03-12 MED ORDER — ACETAMINOPHEN 325 MG PO TABS
650.0000 mg | ORAL_TABLET | Freq: Once | ORAL | Status: AC
  Administered 2019-03-12: 650 mg via ORAL
  Filled 2019-03-12: qty 2

## 2019-03-12 MED ORDER — HYDROCODONE-ACETAMINOPHEN 5-325 MG PO TABS
1.0000 | ORAL_TABLET | Freq: Four times a day (QID) | ORAL | Status: DC | PRN
  Administered 2019-03-12 – 2019-03-14 (×6): 1 via ORAL
  Filled 2019-03-12 (×5): qty 1

## 2019-03-12 MED ORDER — ACETAMINOPHEN 325 MG PO TABS
650.0000 mg | ORAL_TABLET | Freq: Four times a day (QID) | ORAL | Status: DC | PRN
  Administered 2019-03-12 (×2): 650 mg via ORAL
  Filled 2019-03-12 (×2): qty 2

## 2019-03-12 MED ORDER — ATORVASTATIN CALCIUM 40 MG PO TABS
40.0000 mg | ORAL_TABLET | Freq: Every day | ORAL | Status: DC
  Administered 2019-03-12 – 2019-03-19 (×8): 40 mg via ORAL
  Filled 2019-03-12 (×8): qty 1

## 2019-03-12 MED ORDER — DILTIAZEM HCL ER COATED BEADS 120 MG PO CP24
360.0000 mg | ORAL_CAPSULE | Freq: Every day | ORAL | Status: DC
  Administered 2019-03-12 – 2019-03-19 (×8): 360 mg via ORAL
  Filled 2019-03-12 (×8): qty 3

## 2019-03-12 MED ORDER — PREDNISONE 5 MG PO TABS
5.0000 mg | ORAL_TABLET | Freq: Every day | ORAL | Status: DC
  Administered 2019-03-12 – 2019-03-19 (×8): 5 mg via ORAL
  Filled 2019-03-12 (×8): qty 1

## 2019-03-12 MED ORDER — POTASSIUM CHLORIDE CRYS ER 20 MEQ PO TBCR
30.0000 meq | EXTENDED_RELEASE_TABLET | ORAL | Status: AC
  Administered 2019-03-12: 30 meq via ORAL
  Filled 2019-03-12: qty 1

## 2019-03-12 MED ORDER — ONDANSETRON HCL 4 MG PO TABS: 4.0000 mg | ORAL_TABLET | Freq: Four times a day (QID) | ORAL | Status: DC | PRN

## 2019-03-12 MED ORDER — CLONAZEPAM 0.5 MG PO TABS
0.5000 mg | ORAL_TABLET | Freq: Two times a day (BID) | ORAL | Status: DC | PRN
  Administered 2019-03-12 – 2019-03-16 (×5): 0.5 mg via ORAL
  Filled 2019-03-12 (×5): qty 1

## 2019-03-12 MED ORDER — CARVEDILOL 6.25 MG PO TABS
6.2500 mg | ORAL_TABLET | Freq: Two times a day (BID) | ORAL | Status: DC
  Administered 2019-03-12 – 2019-03-19 (×15): 6.25 mg via ORAL
  Filled 2019-03-12 (×15): qty 1

## 2019-03-12 MED ORDER — ENOXAPARIN SODIUM 40 MG/0.4ML ~~LOC~~ SOLN
40.0000 mg | SUBCUTANEOUS | Status: DC
  Administered 2019-03-12 – 2019-03-17 (×5): 40 mg via SUBCUTANEOUS
  Filled 2019-03-12 (×6): qty 0.4

## 2019-03-12 MED ORDER — TRAMADOL HCL 50 MG PO TABS
50.0000 mg | ORAL_TABLET | Freq: Four times a day (QID) | ORAL | Status: DC | PRN
  Administered 2019-03-12: 50 mg via ORAL
  Filled 2019-03-12: qty 1

## 2019-03-12 MED ORDER — MAGNESIUM SULFATE IN D5W 1-5 GM/100ML-% IV SOLN
1.0000 g | Freq: Once | INTRAVENOUS | Status: AC
  Administered 2019-03-12: 1 g via INTRAVENOUS
  Filled 2019-03-12: qty 100

## 2019-03-12 MED ORDER — HYDROXYCHLOROQUINE SULFATE 200 MG PO TABS
200.0000 mg | ORAL_TABLET | Freq: Every day | ORAL | Status: DC
  Administered 2019-03-12 – 2019-03-19 (×8): 200 mg via ORAL
  Filled 2019-03-12 (×8): qty 1

## 2019-03-12 MED ORDER — ACETAMINOPHEN 650 MG RE SUPP: 650.0000 mg | Freq: Four times a day (QID) | RECTAL | Status: DC | PRN

## 2019-03-12 MED ORDER — ONDANSETRON HCL 4 MG/2ML IJ SOLN: 4.0000 mg | Freq: Four times a day (QID) | INTRAMUSCULAR | Status: DC | PRN

## 2019-03-12 MED ORDER — SODIUM CHLORIDE 0.9 % IV SOLN
Freq: Once | INTRAVENOUS | Status: AC
  Administered 2019-03-12: 06:00:00 via INTRAVENOUS

## 2019-03-12 MED ORDER — HYDROCODONE-ACETAMINOPHEN 5-325 MG PO TABS
1.0000 | ORAL_TABLET | ORAL | Status: AC
  Filled 2019-03-12: qty 1

## 2019-03-12 MED ORDER — HYDRALAZINE HCL 50 MG PO TABS
75.0000 mg | ORAL_TABLET | Freq: Three times a day (TID) | ORAL | Status: DC
  Administered 2019-03-12 – 2019-03-19 (×21): 75 mg via ORAL
  Filled 2019-03-12: qty 1
  Filled 2019-03-12: qty 3
  Filled 2019-03-12: qty 1
  Filled 2019-03-12: qty 3
  Filled 2019-03-12: qty 1
  Filled 2019-03-12: qty 3
  Filled 2019-03-12 (×5): qty 1
  Filled 2019-03-12: qty 3
  Filled 2019-03-12 (×3): qty 1
  Filled 2019-03-12 (×2): qty 3
  Filled 2019-03-12: qty 1
  Filled 2019-03-12 (×2): qty 3
  Filled 2019-03-12: qty 1

## 2019-03-12 MED ORDER — BUSPIRONE HCL 15 MG PO TABS
30.0000 mg | ORAL_TABLET | Freq: Two times a day (BID) | ORAL | Status: DC
  Administered 2019-03-12 – 2019-03-19 (×15): 30 mg via ORAL
  Filled 2019-03-12: qty 6
  Filled 2019-03-12 (×5): qty 2
  Filled 2019-03-12: qty 3
  Filled 2019-03-12 (×2): qty 2
  Filled 2019-03-12 (×2): qty 6
  Filled 2019-03-12: qty 2
  Filled 2019-03-12 (×2): qty 6
  Filled 2019-03-12: qty 2
  Filled 2019-03-12: qty 6
  Filled 2019-03-12: qty 3
  Filled 2019-03-12: qty 2
  Filled 2019-03-12: qty 6
  Filled 2019-03-12: qty 2
  Filled 2019-03-12 (×6): qty 6
  Filled 2019-03-12 (×2): qty 2
  Filled 2019-03-12: qty 6
  Filled 2019-03-12: qty 3
  Filled 2019-03-12: qty 2
  Filled 2019-03-12: qty 6

## 2019-03-12 MED ORDER — ALBUTEROL SULFATE (2.5 MG/3ML) 0.083% IN NEBU: 2.5000 mg | INHALATION_SOLUTION | Freq: Four times a day (QID) | RESPIRATORY_TRACT | Status: DC | PRN

## 2019-03-12 MED ORDER — GADOBUTROL 1 MMOL/ML IV SOLN
5.0000 mL | Freq: Once | INTRAVENOUS | Status: AC | PRN
  Administered 2019-03-12: 5 mL via INTRAVENOUS

## 2019-03-12 NOTE — Progress Notes (Signed)
CRITICAL VALUE ALERT  Critical Value:  Serum Osmolality 248  Date & Time Notied:  03/12/19 @ 9:25  Provider Notified: Fuller Plan MD  Orders Received/Actions taken: pending

## 2019-03-12 NOTE — H&P (Signed)
History and Physical    Regina Ortiz VFI:[id number] DOB: 12-07-1945 DOA: 03/12/2019  Referring MD/NP/PA: Shela Leff, MD PCP: Maurice Small, MD  Patient coming from: Home via EMS  Chief Complaint: Fall  I have personally briefly reviewed patient's old medical records in [place]   HPI: Regina Ortiz is a 73 y.o. female with medical history significant of herniated disks L4/L5 with cauda equina syndrome s/p surgical decompression on 01/28/2019 by Dr. Ellene Route, hypertension, chronic hyponatremia, rheumatoid arthritis, COPD, macrocytic anemia, and GERD; who presents after having a fall at home.  Patient reports that she only has caregivers during the day and at night she is a woman.  Last night she had developed and had gotten out of bed to turn down the thermostat.  She reports trying to use the walker but was "teeter tottering" down the hall when she lost her balance and fell.  She hit her head but denies any loss of consciousness.  Just last admitted into the hospital from 6/15 -6/23 for hyponatremia and acute urinary retention for which a Foley catheter was placed for the patient to follow-up with urology in 3 to 4 weeks.  During that hospitalization she was taken off of losartan and Aldactone which she reports no longer taking.  They had also increased prednisone 20 mg twice daily for a few days, but was reduced back to home dose of 5 mg prior to discharge.  Since being home patient reports having worsening burning pain with spasms in her buttocks and down her left leg. Taking tramadol provides little to no relief.  Associated symptoms include constipation for the last 3 to 4 days, nausea, anxiety, and decreased appetite.  Denies having any fever, vomiting, or constipation symptoms.   ED Course: Upon admission into the emergency department patient was noted to be afebrile with blood pressure elevated up to 162/76, and all other vital signs maintained.  Labs revealed WBC 11.9, hemoglobin  10.8, sodium 118, potassium 3.3, chloride 85, BUN 15, and creatinine 0.99.  CT scans of the head C-spine and face negative for any acute abnormalities.  CT scan of the lumbar spine revealed no hardware failure.  X-rays of the pelvis however did note the possibility of a nondisplaced pubic ramus fracture.  She was given Tylenol and started on normal saline at 75 mL/h.  TRH called to admit.  Review of Systems  Constitutional: Positive for malaise/fatigue. Negative for chills and fever.  HENT: Negative for congestion and ear pain.   Eyes: Negative for pain.  Respiratory: Positive for shortness of breath.   Cardiovascular: Negative for chest pain and leg swelling.  Gastrointestinal: Positive for diarrhea and nausea. Negative for constipation and vomiting.  Genitourinary: Negative for dysuria and hematuria.  Musculoskeletal: Positive for falls, joint pain and myalgias.  Skin: Negative for rash.  Neurological: Positive for tingling and weakness. Negative for loss of consciousness.  Endo/Heme/Allergies: Negative for polydipsia. Bruises/bleeds easily.  Psychiatric/Behavioral: Negative for depression. The patient is nervous/anxious.     Past Medical History:  Diagnosis Date  . AIN grade I   . Anxiety   . Bruises easily   . Chronic diarrhea   . Chronic hyponatremia   . Colitis   . COPD (chronic obstructive pulmonary disease) ([place])   . Depression   . GERD (gastroesophageal reflux disease)   . History of chronic bronchitis   . History of vulvar dysplasia    serveral recurrency's   . Hypertension   . LBBB (left bundle branch  block) W/ PROLONGED PR   CARDIOLOGSIT-  DR Johnsie Cancel-   LOV IN EPIC  . Macrocytosis   . Nocturia   . Osteoporosis   . Seronegative rheumatoid arthritis ([place])   . Short of breath on exertion   . Smokers' cough ([place])   . Venous insufficiency of leg    , Edema  . VIN III (vulvar intraepithelial neoplasia III)    RECURRENT -    Past Surgical History:  Procedure  Laterality Date  . APPENDECTOMY  1992  . CARDIOVASCULAR STRESS TEST  06-23-2012  DR Jenkins [place]   NORMAL LEXISCAN MYOVIEW/ EF 83%/  NO ISCHEMIA  . CO2 LASER APPLICATION  43/15/4008   Procedure: CO2 LASER APPLICATION;  Surgeon: Janie Morning, MD PHD;  Location: [facility];  Service: Gynecology;  Laterality: N/A;  Laser Vaporization  . CO2 LASER APPLICATION N/A 02/05/6194   Procedure: CO2 LASER APPLICATION;  Surgeon: [name], MD;  Location: [facility];  Service: Gynecology;  Laterality: N/A;  . CO2 LASER APPLICATION N/A 0/93/2671   Procedure: CO2 LASER APPLICATION of the vulva;  Surgeon: Janie Morning, MD;  Location: [facility];  Service: Gynecology;  Laterality: N/A;  . EXCISION VULVAR LESIONS  07/2012  . HEMORRHOID SURGERY N/A 04/15/2013   Procedure: Excision of external and internal  hemorrhoid with AIN, Exam under anesthesia;  Surgeon: Odis Hollingshead, MD;  Location: [facility];  Service: General;  Laterality: N/A;  . REPAIR FISTULA IN ANO/  I & D PERIRECTAL ABSCESS  10-26-2002  DR Johnathan Hausen  . TRANSTHORACIC ECHOCARDIOGRAM  06-23-2012   NORMAL LVSF/ EF 55-60%  . VAGINAL HYSTERECTOMY  1997   partial  . VULVAR LESION REMOVAL N/A 11/03/2014   Procedure: CO2 LASER OF VULVAR ;  Surgeon: [name], MD;  Location: [facility];  Service: Gynecology;  Laterality: N/A;  . VULVECTOMY N/A 03/26/2015   Procedure: WIDE LOCA  EXCISION OF VULVAR;  Surgeon: [name], MD;  Location: [place];  Service: Gynecology;  Laterality: N/A;  . Wide Local Excision of labia majora  04-04-2010   Right-sided lesion, CO2 ablation of right labia minora     reports that she has been smoking cigarettes. She has a 12.50 pack-year smoking history. She has never used smokeless tobacco. She reports current alcohol use of about 2.0 standard drinks of alcohol per week. She reports that she does not use drugs.  Allergies  Allergen Reactions   . Latex Swelling and Other (See Comments)     fever blisters  . Augmentin [Amoxicillin-Pot Clavulanate] Nausea And Vomiting  . Doxycycline Other (See Comments)    Blurred vision, nervous, unsteady  . Lactose Intolerance (Gi) Other (See Comments)    Unknown, per pt  . Lialda [Mesalamine] Diarrhea  . Prinivil [Lisinopril] Diarrhea  . Triamcinolone Acetonide Other (See Comments)    REDNESS AND PAIN  . Amlodipine Rash  . Lotensin [Benazepril Hcl] Rash    Family History  Problem Relation Age of Onset  . Diabetes Mother   . Hypertension Father   . Lymphoma Son   . Cancer Son        lymphoblastic lymphoma    Prior to Admission medications   Medication Sig Start Date End Date Taking? Authorizing Provider  acetaminophen (TYLENOL) 325 MG tablet Take 2 tablets (650 mg total) by mouth every 6 (six) hours. 02/22/19  Yes Mercy Riding, MD  alendronate (FOSAMAX) 70 MG tablet Take 70 mg by mouth every  Monday.  11/29/18  Yes [provider]  atorvastatin (LIPITOR) 40 MG tablet Take 1 tablet by mouth once daily 12/22/18  Yes Skains, Thana Farr, MD  busPIRone (BUSPAR) 30 MG tablet Take 30 mg by mouth 2 (two) times daily.  11/20/14  Yes [provider]  Calcium Carbonate-Vitamin D (CALTRATE 600+D PO) Take 1 tablet by mouth daily.    Yes [provider]  carvedilol (COREG) 6.25 MG tablet Take 1 tablet (6.25 mg total) by mouth 2 (two) times daily with a meal. 02/22/19 02/22/20 Yes Gonfa, Charlesetta Ivory, MD  Cholecalciferol (VITAMIN D3) 1000 UNITS CAPS Take 1,000 Units by mouth daily.    Yes [provider]  clonazePAM (KLONOPIN) 0.5 MG tablet Take 1 tablet (0.5 mg total) by mouth 2 (two) times daily as needed for up to 30 days for anxiety. 02/22/19 03/24/19 Yes Mercy Riding, MD  diltiazem (TIAZAC) 360 MG 24 hr capsule Take 360 mg by mouth daily.    Yes [provider]  hydrALAZINE (APRESOLINE) 25 MG tablet Take 3 tablets (75 mg total) by mouth 3 (three) times daily. 02/22/19   Yes Mercy Riding, MD  hydroxychloroquine (PLAQUENIL) 200 MG tablet Take 200 mg by mouth daily.   Yes [provider]  polyethylene glycol (MIRALAX / GLYCOLAX) 17 g packet Take 17 g by mouth daily as needed for mild constipation.   Yes [provider]  predniSONE (DELTASONE) 5 MG tablet Take 5 mg by mouth daily with breakfast. --  LONG-TERM USE FOR RA   Yes [provider]  traMADol (ULTRAM) 50 MG tablet Take 1 tablet (50 mg total) by mouth every 6 (six) hours as needed. Patient taking differently: Take 50 mg by mouth every 6 (six) hours as needed for moderate pain.  01/25/19  Yes Virgel Manifold, MD    Physical Exam:  Constitutional: Elderly female who appears to be in some discomfort Vitals:   03/12/19 0136 03/12/19 0605 03/12/19 0615 03/12/19 0645  BP: (!) 144/60 (!) 162/76 (!) 147/76 133/60  Pulse: 68 77 75 72  Resp: 18 15 16 14   Temp: 97.6 F (36.4 C)     TempSrc: Oral     SpO2: 95% 99% 97% 98%   Eyes: PERRL, lids and conjunctivae normal ENMT: Mucous membranes are dry. Posterior pharynx clear of any exudate or lesions.   Neck: normal, supple, no masses, no thyromegaly Respiratory: clear to auscultation bilaterally, no wheezing, no crackles. Normal respiratory effort. No accessory muscle use.  Cardiovascular: Regular rate and rhythm, no murmurs / rubs / gallops. No extremity edema. 2+ pedal pulses. No carotid bruits.  Abdomen: Generalized tenderness, no masses palpated. No hepatosplenomegaly. Bowel sounds positive.  Musculoskeletal: no clubbing / cyanosis. No joint deformity upper and lower extremities. Good ROM, no contractures. Normal muscle tone.  Skin: Significant bruising of the left forearm, right temple, and multiple other small areas of bruising of the upper and lower extremities bilaterally. Neurologic: CN 2-12 grossly intact.  Patient able to move all extremities. Psychiatric: Normal judgment and insight. Alert and oriented x 3.  Anxious mood.      Labs on Admission: I have personally reviewed following labs and imaging studies  CBC: Recent Labs  Lab 03/12/19 0403  WBC 11.9*  NEUTROABS 9.9*  HGB 10.8*  HCT 29.8*  MCV 100.7*  PLT 638   Basic Metabolic Panel: Recent Labs  Lab 03/12/19 0403  NA 118*  K 3.3*  CL 85*  CO2 22  GLUCOSE 99  BUN 15  CREATININE 0.99  CALCIUM 8.4*   GFR: CrCl cannot be calculated (Unknown ideal weight.). Liver Function Tests: No results for input(s): AST, ALT, ALKPHOS, BILITOT, PROT, ALBUMIN in the last 168 hours. No results for input(s): LIPASE, AMYLASE in the last 168 hours. No results for input(s): AMMONIA in the last 168 hours. Coagulation Profile: No results for input(s): INR, PROTIME in the last 168 hours. Cardiac Enzymes: No results for input(s): CKTOTAL, CKMB, CKMBINDEX, TROPONINI in the last 168 hours. BNP (last 3 results) No results for input(s): PROBNP in the last 8760 hours. HbA1C: No results for input(s): HGBA1C in the last 72 hours. CBG: No results for input(s): GLUCAP in the last 168 hours. Lipid Profile: No results for input(s): CHOL, HDL, LDLCALC, TRIG, CHOLHDL, LDLDIRECT in the last 72 hours. Thyroid Function Tests: No results for input(s): TSH, T4TOTAL, FREET4, T3FREE, THYROIDAB in the last 72 hours. Anemia Panel: No results for input(s): VITAMINB12, FOLATE, FERRITIN, TIBC, IRON, RETICCTPCT in the last 72 hours. Urine analysis:    Component Value Date/Time   COLORURINE YELLOW 02/14/2019 2141   APPEARANCEUR CLEAR 02/14/2019 2141   LABSPEC 1.008 02/14/2019 2141   PHURINE 7.0 02/14/2019 2141   GLUCOSEU NEGATIVE 02/14/2019 2141   HGBUR NEGATIVE 02/14/2019 2141   BILIRUBINUR NEGATIVE 02/14/2019 2141   Man NEGATIVE 02/14/2019 2141   PROTEINUR 30 (A) 02/14/2019 2141   UROBILINOGEN 0.2 09/22/2012 1100   NITRITE NEGATIVE 02/14/2019 2141   LEUKOCYTESUR NEGATIVE 02/14/2019 2141   Sepsis Labs: No results found for this or any previous visit (from the past  240 hour(s)).   Radiological Exams on Admission: Dg Elbow Complete Left  Result Date: 03/12/2019 CLINICAL DATA:  Initial evaluation for acute trauma, fall. EXAM: LEFT ELBOW - COMPLETE 3+ VIEW COMPARISON:  None. FINDINGS: There is no evidence of fracture, dislocation, or joint effusion. There is no evidence of arthropathy or other focal bone abnormality. Soft tissues are unremarkable. IMPRESSION: No acute osseous abnormality about the left elbow. Electronically Signed   By: Jeannine Boga M.D.   On: 03/12/2019 04:50   Dg Elbow Complete Right  Result Date: 03/12/2019 CLINICAL DATA:  Initial evaluation for acute trauma, fall. EXAM: RIGHT ELBOW - COMPLETE 3+ VIEW COMPARISON:  None. FINDINGS: No acute fracture dislocation. No joint effusion. Radial head intact. Mild degenerative spurring present about the elbow. No visible soft tissue injury. IMPRESSION: No acute osseous abnormality about the right elbow. Electronically Signed   By: Jeannine Boga M.D.   On: 03/12/2019 04:52   Ct Head Wo Contrast  Result Date: 03/12/2019 CLINICAL DATA:  Pt BIB [facility] from home alone. Pt was changing her thermostat when she lost her balance and fell. Pt has hx of back surgery on May 29 EXAM: CT HEAD WITHOUT CONTRAST CT MAXILLOFACIAL WITHOUT CONTRAST CT CERVICAL SPINE WITHOUT CONTRAST TECHNIQUE: Multidetector CT imaging of the head, cervical spine, and maxillofacial structures were performed using the standard protocol without intravenous contrast. Multiplanar CT image reconstructions of the cervical spine and maxillofacial structures were also generated. COMPARISON:  09/24/2017 FINDINGS: CT HEAD FINDINGS Brain: No evidence of acute infarction, hemorrhage, hydrocephalus, extra-axial collection or mass lesion/mass effect. Vascular: No hyperdense vessel or unexpected calcification. Skull: Normal. Negative for fracture or focal lesion. Other: None. CT MAXILLOFACIAL FINDINGS Osseous: No fracture or mandibular  dislocation. No destructive process. Orbits: Negative. No traumatic or inflammatory finding. Sinuses: Clear. Soft tissues: Negative. CT CERVICAL SPINE FINDINGS Alignment: Normal. Skull base and vertebrae: No acute fracture. No primary bone lesion or focal  pathologic process. Soft tissues and spinal canal: No prevertebral fluid or swelling. No visible canal hematoma. Disc levels: Mild loss of disc height at C4-C5 and C5-C6. Small endplate osteophytes at these levels. Minor disc bulging. No convincing disc herniation. There are facet degenerative changes most evident bilaterally at C5-C6. Upper chest: No acute findings. No masses or adenopathy. Mild centrilobular emphysema and scarring at the apices of the lungs. Other: None. IMPRESSION: HEAD CT 1. No intracranial abnormality. 2. No skull fracture. MAXILLOFACIAL CT 1. No fracture or acute finding. CERVICAL CT 1. No fracture or acute finding. Electronically Signed   By: Lajean Manes M.D.   On: 03/12/2019 04:39   Ct Cervical Spine Wo Contrast  Result Date: 03/12/2019 CLINICAL DATA:  Pt BIB [facility] from home alone. Pt was changing her thermostat when she lost her balance and fell. Pt has hx of back surgery on May 29 EXAM: CT HEAD WITHOUT CONTRAST CT MAXILLOFACIAL WITHOUT CONTRAST CT CERVICAL SPINE WITHOUT CONTRAST TECHNIQUE: Multidetector CT imaging of the head, cervical spine, and maxillofacial structures were performed using the standard protocol without intravenous contrast. Multiplanar CT image reconstructions of the cervical spine and maxillofacial structures were also generated. COMPARISON:  09/24/2017 FINDINGS: CT HEAD FINDINGS Brain: No evidence of acute infarction, hemorrhage, hydrocephalus, extra-axial collection or mass lesion/mass effect. Vascular: No hyperdense vessel or unexpected calcification. Skull: Normal. Negative for fracture or focal lesion. Other: None. CT MAXILLOFACIAL FINDINGS Osseous: No fracture or mandibular dislocation. No destructive  process. Orbits: Negative. No traumatic or inflammatory finding. Sinuses: Clear. Soft tissues: Negative. CT CERVICAL SPINE FINDINGS Alignment: Normal. Skull base and vertebrae: No acute fracture. No primary bone lesion or focal pathologic process. Soft tissues and spinal canal: No prevertebral fluid or swelling. No visible canal hematoma. Disc levels: Mild loss of disc height at C4-C5 and C5-C6. Small endplate osteophytes at these levels. Minor disc bulging. No convincing disc herniation. There are facet degenerative changes most evident bilaterally at C5-C6. Upper chest: No acute findings. No masses or adenopathy. Mild centrilobular emphysema and scarring at the apices of the lungs. Other: None. IMPRESSION: HEAD CT 1. No intracranial abnormality. 2. No skull fracture. MAXILLOFACIAL CT 1. No fracture or acute finding. CERVICAL CT 1. No fracture or acute finding. Electronically Signed   By: Lajean Manes M.D.   On: 03/12/2019 04:39   Ct Lumbar Spine Wo Contrast  Result Date: 03/12/2019 CLINICAL DATA:  Per ed notes: Pt BIB [facility] from home alone. Pt was changing her thermostat when she lost her balance and fell. Pt has hx of back surgery on May 29 EXAM: CT LUMBAR SPINE WITHOUT CONTRAST TECHNIQUE: Multidetector CT imaging of the lumbar spine was performed without intravenous contrast administration. Multiplanar CT image reconstructions were also generated. COMPARISON:  CT, 02/15/2019 FINDINGS: Segmentation: 5 lumbar type vertebrae. Alignment: Normal. Vertebrae: Choose 1 Paraspinal and other soft tissues: No masses or adenopathy. Chronic bilateral perinephric stranding similar to the prior CT. Dense aortic atherosclerotic calcifications. Disc levels: T12-L1 through L2-L3: No disc bulging or herniation. No stenosis. L3-L4: Mild spondylotic disc bulging. No disc herniation. No significant stenosis. L4-L5: Bilateral pedicle screws and interconnecting rods fuse L4-L5. The orthopedic hardware is well-seated and unchanged  from the prior abdomen and pelvis CT. There is a disc spacer and bone graft material within the L4-L5 disc interspace and along the posterior elements. This all appears stable from the prior study. L5-S1: Moderate loss of disc height. Mild facet degenerative change. Spondylotic disc bulging, but no disc herniation. Mild right neural  foraminal narrowing. IMPRESSION: 1. No fracture or acute finding. 2. Status post posterior lumbar spine fusion at L4-L5. No evidence of disruption of the orthopedic hardware. No change from the study dated 02/15/2019. Electronically Signed   By: Lajean Manes M.D.   On: 03/12/2019 04:44   Dg Pelvis Portable  Result Date: 03/12/2019 CLINICAL DATA:  Initial evaluation for acute trauma, fall. EXAM: PORTABLE PELVIS 1-2 VIEWS COMPARISON:  None. FINDINGS: Curvilinear lucency extends through the right pubis symphysis, favored to reflect summation of overlying soft tissue shadow, although an acute nondisplaced fracture not entirely excluded. No pubic diastasis. Remainder the bony pelvis intact. SI joints approximated. No abnormality about the hips. Prior fusion noted at L4-5. Degenerative changes noted within the lower lumbar spine. Mild osteoarthritic changes about the hips. No visible soft tissue injury. Scattered vascular calcifications noted. IMPRESSION: 1. Curvilinear lucency extending through the right pubis symphysis, favored to reflect summation of overlying soft tissue shadow, although an acute nondisplaced fracture not entirely excluded. Correlation with physical exam recommended. 2. No other acute osseous abnormality about the pelvis. Electronically Signed   By: Jeannine Boga M.D.   On: 03/12/2019 04:54   Dg Chest Portable 1 View  Result Date: 03/12/2019 CLINICAL DATA:  Initial evaluation for acute trauma, fall. EXAM: PORTABLE CHEST 1 VIEW COMPARISON:  Prior radiograph from 09/12/2018 FINDINGS: Transverse heart size at the upper limits of normal. Mediastinal silhouette  within normal limits. Aortic atherosclerosis. Lungs mildly hypoinflated. No focal infiltrates. No edema or effusion. No pneumothorax. No acute osseous abnormality. Diffuse osteopenia. IMPRESSION: 1. No active cardiopulmonary disease. 2. Aortic atherosclerosis. Electronically Signed   By: Jeannine Boga M.D.   On: 03/12/2019 04:48   Ct Maxillofacial Wo Contrast  Result Date: 03/12/2019 CLINICAL DATA:  Pt BIB [facility] from home alone. Pt was changing her thermostat when she lost her balance and fell. Pt has hx of back surgery on May 29 EXAM: CT HEAD WITHOUT CONTRAST CT MAXILLOFACIAL WITHOUT CONTRAST CT CERVICAL SPINE WITHOUT CONTRAST TECHNIQUE: Multidetector CT imaging of the head, cervical spine, and maxillofacial structures were performed using the standard protocol without intravenous contrast. Multiplanar CT image reconstructions of the cervical spine and maxillofacial structures were also generated. COMPARISON:  09/24/2017 FINDINGS: CT HEAD FINDINGS Brain: No evidence of acute infarction, hemorrhage, hydrocephalus, extra-axial collection or mass lesion/mass effect. Vascular: No hyperdense vessel or unexpected calcification. Skull: Normal. Negative for fracture or focal lesion. Other: None. CT MAXILLOFACIAL FINDINGS Osseous: No fracture or mandibular dislocation. No destructive process. Orbits: Negative. No traumatic or inflammatory finding. Sinuses: Clear. Soft tissues: Negative. CT CERVICAL SPINE FINDINGS Alignment: Normal. Skull base and vertebrae: No acute fracture. No primary bone lesion or focal pathologic process. Soft tissues and spinal canal: No prevertebral fluid or swelling. No visible canal hematoma. Disc levels: Mild loss of disc height at C4-C5 and C5-C6. Small endplate osteophytes at these levels. Minor disc bulging. No convincing disc herniation. There are facet degenerative changes most evident bilaterally at C5-C6. Upper chest: No acute findings. No masses or adenopathy. Mild centrilobular  emphysema and scarring at the apices of the lungs. Other: None. IMPRESSION: HEAD CT 1. No intracranial abnormality. 2. No skull fracture. MAXILLOFACIAL CT 1. No fracture or acute finding. CERVICAL CT 1. No fracture or acute finding. Electronically Signed   By: Lajean Manes M.D.   On: 03/12/2019 04:39    EKG: Independently reviewed.  Sinus rhythm at 72 bpm with prolonged QTC of 540  Assessment/Plan Hyponatremia: Acute on chronic: Patient has a long history  of hyponatremia.  Sodium again as low as 118.  During last hospitalization on 6/15 -6/23 symptoms were thought secondary to Aldactone and losartan which were discontinued.  TSH previously noted to be within normal limits. -Admit to a medical telemetry bed -Check urine sodium, urine osmolarity -Recheck BMPs every 4 hours -Continue normal saline at 75 mL/h -Reassess with adjustment to fluids as needed   Fall, possible pubic ramus fracture: Acute.  Patient without aids at night.  She got up and was having difficulty keeping her balance subsequently falling.  X-rays revealed the possibility of a nondisplaced pubic fracture. -PT/OT to eval and treat -Social work consult for possible need of placement  L4-L5 disc herniation status post surgical decompression and stabilization with pedicle screw fixation on 5/29 and by neurosurgery, lumbar sacral radiculopathy, urinary retention: During last hospitalization patient was to follow-up with urology in 3 to 4 weeks.  Patient reports continued pain down the buttock and into the left leg despite tramadol.  Neurosurgery consulted by the ED provider.  Question the possibility of some form of nerve involvement around previous surgical site.  CT scans revealed no significant changes at the postoperative site. -Continue Foley catheter  -Will need to ensure patient has urology follow-up -Hydrocodone as needed for pain -Follow-up MRI of the lumbar spine per neurosurgery -Trial of gabapentin -Appreciate  neurosurgery consultative services, we will follow for further recommendation  Leukocytosis: Acute.  WBC elevated at 11 5 on admission.  Chest x-ray clear. Patient with possible fracture noted on pelvic x-ray as a cause for elevation and white blood cell count.  Question possibility of urinary tract infection since patient has pain with indwelling catheter for cause of nausea. -Follow-up urinalysis -Recheck CBC in a.m.  Prolonged QT interval: Acute on chronic.  QTC noted to be 540 on admission. -Correct electrolyte abnormalities -Give 1 g of magnesium sulfate -Hold QT prolonging medication -Recheck EKG in a.m.  Constipation: Patient reports last bowel movement 3 to 4 days ago.  Patient on pain medications with tramadol at home with MiraLAX. -Senokot S twice daily -Monitor intake and output  Hypokalemia: Acute.  Potassium 3.3 on admission. -Give 30 mEq of potassium chloride x1 dose now -Continue to monitor place as needed  Macrocytic anemia: Improving.  Hemoglobin 10.8 g/dL on admission improved from 8 g/dL last checked on 6/17.  Patient with elevated MCV and MCH, but vitamin B12 was 1298 and folate 34 when checked just last month.  -Recheck CBC in a.m.  Essential hypertension: Stable -Continue metoprolol, diltiazem, and hydralazine as tolerated  Rheumatoid arthritis on immunosuppressive therapy: Patient with seronegative rheumatoid arthritis currently on on long term prednisone -Continue prednisone and Plaquenil  COPD, without acute exacerbation: Stable. -Albuterol breathing treatments as needed  Chronic diastolic congestive heart failure: Patient appears to be euvolemic at this time.  Last echo from 09/2017 revealed EF 55-60 % with grade 1 diastolic dysfunction. -Continue to monitor  Hyperlipidemia -Continue atorvastatin  Anxiety -Continue BuSpar and Klonopin as needed for anxiety  Osteoporosis -Continue Fosamax at discharge   DVT prophylaxis: Lovenox Code Status: Full  Family Communication: No family present at bedside Disposition Plan: To be determined Consults called: Neurosurgery Admission status: Observation  Norval Morton MD [facility] Pager [phone]   If 7PM-7AM, please contact night-coverage [email or web address] Password [facility]  03/12/2019, 7:11 AM

## 2019-03-12 NOTE — ED Notes (Signed)
Pt ambulated in room with unsteady gait, needing two person assist. EDP notified.

## 2019-03-12 NOTE — ED Notes (Signed)
Attempted report 

## 2019-03-12 NOTE — Evaluation (Signed)
Physical Therapy Evaluation Patient Details Name: Regina Ortiz MRN: [id number] DOB: 06/08/1946 Today's Date: 03/12/2019   History of Present Illness  73 y.o. female with medical history significant of herniated disks L4/L5 with cauda equina syndrome s/p surgical decompression on 01/28/2019 by Dr. Ellene Route, hypertension, chronic hyponatremia, rheumatoid arthritis, COPD, macrocytic anemia, and GERD; who presents after having a fall at home. Elevated BP in ED 162/76, as well as persistent LBP and LLE pain. admitted for observation 03/12/2019. Imaging did not reveal any acute fx.  Clinical Impression  PTA pt living on first floor of multilevel home with HHA from 9am-4pm 7 days a week. HHA provides iADLs and assists with ADLs. Pt current with HHPT from back surgery in May. Pt is severely limited in safe mobility by pain and decreased strength and endurance. Pt is min A for bed mobility, transfers and 3 feet of lateral stepping with RW. PT recommending SNF level rehab at discharge however pt refuses, in which case continuation of HHPT is warranted. PT will continue to follow acutely.      Follow Up Recommendations SNF;Supervision - Intermittent(pt refuses SNF and has good rapport with HHPT)    Equipment Recommendations  None recommended by PT    Recommendations for Other Services OT consult     Precautions / Restrictions Precautions Precautions: Fall;Back Restrictions Weight Bearing Restrictions: No      Mobility  Bed Mobility Overal bed mobility: Needs Assistance Bed Mobility: Rolling;Sidelying to Sit;Sit to Sidelying Rolling: Supervision Sidelying to sit: Min assist     Sit to sidelying: Min assist General bed mobility comments: min A for trunk to upright and management of LE back into bed  Transfers Overall transfer level: Needs assistance Equipment used: Rolling walker (2 wheeled)   Sit to Stand: Min guard         General transfer comment: min guard for  safety  Ambulation/Gait Ambulation/Gait assistance: Min assist Gait Distance (Feet): 3 Feet Assistive device: Rolling walker (2 wheeled) Gait Pattern/deviations: Step-to pattern;Decreased step length - right;Decreased step length - left Gait velocity: decreased Gait velocity interpretation: <1.31 ft/sec, indicative of household ambulator General Gait Details: minA for lateral stepping 3 feet towards HoB        Balance Overall balance assessment: Needs assistance Sitting-balance support: Feet supported;No upper extremity supported Sitting balance-Leahy Scale: Fair     Standing balance support: Bilateral upper extremity supported Standing balance-Leahy Scale: Poor Standing balance comment: requires B UE support                             Pertinent Vitals/Pain Pain Assessment: 0-10 Pain Score: 10-Worst pain ever Pain Location: back and neck, head and L LE  Pain Descriptors / Indicators: Burning;Aching Pain Intervention(s): Limited activity within patient's tolerance;Monitored during session;Repositioned;Patient requesting pain meds-RN notified;RN gave pain meds during session    Home Living Family/patient expects to be discharged to:: Private residence Living Arrangements: Alone Available Help at Discharge: Personal care attendant(9-4/ 7 days a week) Type of Home: House Home Access: Stairs to enter Entrance Stairs-Rails: None Entrance Stairs-Number of Steps: 2 Home Layout: Two level;Able to live on main level with bedroom/bathroom Home Equipment: Gilford Rile - 2 wheels      Prior Function Level of Independence: Needs assistance   Gait / Transfers Assistance Needed: ambulates with RW limited household levels   ADL's / Homemaking Assistance Needed: HHA assists with bathing and dressing and performs iADLs  Extremity/Trunk Assessment   Upper Extremity Assessment Upper Extremity Assessment: Generalized weakness    Lower Extremity Assessment Lower  Extremity Assessment: LLE deficits/detail;Generalized weakness LLE Deficits / Details: left leg with burning pain from hip down to foot.   LLE Sensation: decreased light touch    Cervical / Trunk Assessment Cervical / Trunk Assessment: Other exceptions Cervical / Trunk Exceptions: post op lumbar fusion  Communication   Communication: No difficulties  Cognition Arousal/Alertness: Awake/alert Behavior During Therapy: WFL for tasks assessed/performed Overall Cognitive Status: Impaired/Different from baseline                           Safety/Judgement: Decreased awareness of safety   Problem Solving: Slow processing;Requires verbal cues;Requires tactile cues        General Comments General comments (skin integrity, edema, etc.): pt with brusing on R eye, bilateral UE and back incision site        Assessment/Plan    PT Assessment Patient needs continued PT services  PT Problem List Decreased strength;Decreased activity tolerance;Decreased balance;Decreased mobility;Decreased knowledge of use of DME;Decreased knowledge of precautions;Pain;Impaired sensation       PT Treatment Interventions DME instruction;Gait training;Stair training;Functional mobility training;Therapeutic exercise;Therapeutic activities;Balance training;Neuromuscular re-education;Patient/family education;Modalities    PT Goals (Current goals can be found in the Care Plan section)  Acute Rehab PT Goals Patient Stated Goal: to go home PT Goal Formulation: With patient Time For Goal Achievement: 03/26/19 Potential to Achieve Goals: Good    Frequency Min 3X/week   Barriers to discharge Decreased caregiver support         AM-PAC PT "6 Clicks" Mobility  Outcome Measure Help needed turning from your back to your side while in a flat bed without using bedrails?: A Little Help needed moving from lying on your back to sitting on the side of a flat bed without using bedrails?: A Little Help needed  moving to and from a bed to a chair (including a wheelchair)?: A Little Help needed standing up from a chair using your arms (e.g., wheelchair or bedside chair)?: A Little Help needed to walk in hospital room?: A Lot Help needed climbing 3-5 steps with a railing? : Total 6 Click Score: 15    End of Session Equipment Utilized During Treatment: Gait belt Activity Tolerance: Patient limited by pain Patient left: in bed;with call bell/phone within reach;with nursing/sitter in room Nurse Communication: Mobility status PT Visit Diagnosis: Other abnormalities of gait and mobility (R26.89) Pain - Right/Left: Left Pain - part of body: Leg    Time: 2637-8588 PT Time Calculation (min) (ACUTE ONLY): 20 min   Charges:   PT Evaluation $PT Eval Moderate Complexity: 1 Mod          Shterna Laramee B. Migdalia Dk PT, DPT Acute Rehabilitation Services Pager [phone] Office [phone]   [place] 03/12/2019, 4:23 PM

## 2019-03-12 NOTE — Consult Note (Signed)
Chief Complaint   Chief Complaint  Patient presents with   Back Pain   Fall    HPI   Consult requested by: Dr Wyvonnia Dusky Reason for consult: low back pain  HPI: Regina Ortiz is a 74 y.o. female who presented to ED early this morning after a fall at home. She is s/p L4-5 decompression and fusion by Dr Ellene Route on 01/28/2019 due to lumbar spinal stenosis with urinary retention. She had a relatively uncomplicated hospital course. She was discharged to SNF on 02/03/2019 without foley catheter. She returned to the emergency room on 02/14/2019 for worsening urinary retention. She underwent a repeat MRI which revealed residual/recurrent L4-5 stenosis. Dr Ellene Route was consulted and did not recommend repeat surgery. Urology was consulted. Patient discharged with foley catheter and advised to follow outpt.   She returned back to ED after a fall. She endorses persistent bilateral lower back pain and LLE pain. Pain has been present since surgery. No worsening. Feels unsteady on feet due to pain. Does endorse generalized weakness, but nonfocal weakness. Has catheter in place.  She lives at home and has nurses check on her. She at baseline ambulates with a walker. Simply lost balance and fell. No presyncopal symptoms.  Patient Active Problem List   Diagnosis Date Noted   Urinary retention 02/15/2019   Lumbosacral radiculopathy 02/15/2019   Constipation 02/15/2019   Acute urinary retention 02/15/2019   Cauda equina syndrome ([place]) 01/28/2019   Chest pain 09/25/2017   Hypertensive urgency 09/24/2017   Headache 09/24/2017   Alcohol use 09/24/2017   AKI (acute kidney injury) ([place]) 09/24/2017   Bilateral carotid artery stenosis    Vulvovaginal candidiasis 10/22/2015   Mild malnutrition ([place]) 07/07/2013   Chronic bronchitis ([place]) 07/07/2013   Tobacco abuse 07/07/2013   Hypertension    Short of breath on exertion    Hemorrhoid    LBBB (left bundle branch block)    Anxiety     Depression    RA (rheumatoid arthritis) ([place])    Venous insufficiency of leg    Chronic hyponatremia    AIN grade I 04/05/2013   Diarrhea 09/22/2012   Hyponatremia 09/22/2012   Hypokalemia 09/22/2012   ARF (acute renal failure) ([place]) 09/22/2012   Atypical chest pain 06/22/2012   Anemia 06/22/2012   Vulvar intraepithelial neoplasia III (VIN III) 01/29/2012    PMH: Past Medical History:  Diagnosis Date   AIN grade I    Anxiety    Bruises easily    Chronic diarrhea    Chronic hyponatremia    Colitis    COPD (chronic obstructive pulmonary disease) (HCC)    Depression    GERD (gastroesophageal reflux disease)    History of chronic bronchitis    History of vulvar dysplasia    serveral recurrency's    Hypertension    LBBB (left bundle branch block) W/ PROLONGED PR   CARDIOLOGSIT-  DR Johnsie Cancel-   LOV IN EPIC   Macrocytosis    Nocturia    Osteoporosis    Seronegative rheumatoid arthritis ([place])    Short of breath on exertion    Smokers' cough ([place])    Venous insufficiency of leg    , Edema   VIN III (vulvar intraepithelial neoplasia III)    RECURRENT -    PSH: Past Surgical History:  Procedure Laterality Date   APPENDECTOMY  1992   CARDIOVASCULAR STRESS TEST  06-23-2012  DR Jenkins Rouge   NORMAL LEXISCAN MYOVIEW/ EF 83%/  NO ISCHEMIA  CO2 LASER APPLICATION  51/76/1607   Procedure: CO2 LASER APPLICATION;  Surgeon: Janie Morning, MD PHD;  [facility];  Service: Gynecology;  Laterality: N/A;  Laser Vaporization   CO2 LASER APPLICATION N/A 11/05/1060   Procedure: CO2 LASER APPLICATION;  Surgeon: [name], MD;  Location: [facility];  Service: Gynecology;  Laterality: N/A;   CO2 LASER APPLICATION N/A 6/94/8546   Procedure: CO2 LASER APPLICATION of the vulva;  Surgeon: Janie Morning, MD;  Location: [facility];  Service: Gynecology;  Laterality: N/A;   EXCISION VULVAR LESIONS  07/2012    HEMORRHOID SURGERY N/A 04/15/2013   Procedure: Excision of external and internal  hemorrhoid with AIN, Exam under anesthesia;  Surgeon: Odis Hollingshead, MD;  Location: [facility];  Service: General;  Laterality: N/A;   REPAIR FISTULA IN ANO/  I & D PERIRECTAL ABSCESS  10-26-2002  DR MATTHEW MARTIN   TRANSTHORACIC ECHOCARDIOGRAM  06-23-2012   NORMAL LVSF/ EF 55-60%   VAGINAL HYSTERECTOMY  1997   partial   VULVAR LESION REMOVAL N/A 11/03/2014   Procedure: CO2 LASER OF VULVAR ;  Surgeon: [name], MD;  Location: [facility];  Service: Gynecology;  Laterality: N/A;   VULVECTOMY N/A 03/26/2015   Procedure: WIDE LOCA  EXCISION OF VULVAR;  Surgeon: [name], MD;  Location: [facility];  Service: Gynecology;  Laterality: N/A;   Wide Local Excision of labia majora  04-04-2010   Right-sided lesion, CO2 ablation of right labia minora    Medications Prior to Admission  Medication Sig Dispense Refill Last Dose   acetaminophen (TYLENOL) 325 MG tablet Take 2 tablets (650 mg total) by mouth every 6 (six) hours. 240 tablet 0 03/11/2019 at Unknown time   alendronate (FOSAMAX) 70 MG tablet Take 70 mg by mouth every Monday.    03/07/2019 at Unknown time   atorvastatin (LIPITOR) 40 MG tablet Take 1 tablet by mouth once daily 90 tablet 0 03/11/2019 at Unknown time   busPIRone (BUSPAR) 30 MG tablet Take 30 mg by mouth 2 (two) times daily.    03/11/2019 at Unknown time   Calcium Carbonate-Vitamin D (CALTRATE 600+D PO) Take 1 tablet by mouth daily.    03/11/2019 at Unknown time   carvedilol (COREG) 6.25 MG tablet Take 1 tablet (6.25 mg total) by mouth 2 (two) times daily with a meal. 180 tablet 3 03/11/2019 at 1800   Cholecalciferol (VITAMIN D3) 1000 UNITS CAPS Take 1,000 Units by mouth daily.    03/11/2019 at Unknown time   clonazePAM (KLONOPIN) 0.5 MG tablet Take 1 tablet (0.5 mg total) by mouth 2 (two) times daily as needed for up to 30 days for anxiety. 60 tablet 0 Past Week  at Unknown time   diltiazem (TIAZAC) 360 MG 24 hr capsule Take 360 mg by mouth daily.    03/11/2019 at Unknown time   hydrALAZINE (APRESOLINE) 25 MG tablet Take 3 tablets (75 mg total) by mouth 3 (three) times daily. 270 tablet 1 03/11/2019 at Unknown time   hydroxychloroquine (PLAQUENIL) 200 MG tablet Take 200 mg by mouth daily.   03/11/2019 at Unknown time   polyethylene glycol (MIRALAX / GLYCOLAX) 17 g packet Take 17 g by mouth daily as needed for mild constipation.   Past Week at Unknown time   predniSONE (DELTASONE) 5 MG tablet Take 5 mg by mouth daily with breakfast. --  LONG-TERM USE FOR RA   03/11/2019 at Unknown time   traMADol Veatrice Bourbon)  50 MG tablet Take 1 tablet (50 mg total) by mouth every 6 (six) hours as needed. (Patient taking differently: Take 50 mg by mouth every 6 (six) hours as needed for moderate pain. ) 10 tablet 0 Past Week at Unknown time    SH: Social History   Tobacco Use   Smoking status: Current Every Day Smoker    Packs/day: 0.25    Years: 50.00    Pack years: 12.50    Types: Cigarettes   Smokeless tobacco: Never Used  Substance Use Topics   Alcohol use: Yes    Alcohol/week: 2.0 standard drinks    Types: 2 Cans of beer per week   Drug use: No    MEDS: Prior to Admission medications   Medication Sig Start Date End Date Taking? Authorizing Provider  acetaminophen (TYLENOL) 325 MG tablet Take 2 tablets (650 mg total) by mouth every 6 (six) hours. 02/22/19  Yes Mercy Riding, MD  alendronate (FOSAMAX) 70 MG tablet Take 70 mg by mouth every Monday.  11/29/18  Yes [provider]  atorvastatin (LIPITOR) 40 MG tablet Take 1 tablet by mouth once daily 12/22/18  Yes Skains, Thana Farr, MD  busPIRone (BUSPAR) 30 MG tablet Take 30 mg by mouth 2 (two) times daily.  11/20/14  Yes [provider]  Calcium Carbonate-Vitamin D (CALTRATE 600+D PO) Take 1 tablet by mouth daily.    Yes [provider]  carvedilol (COREG) 6.25 MG tablet Take 1 tablet  (6.25 mg total) by mouth 2 (two) times daily with a meal. 02/22/19 02/22/20 Yes Gonfa, Charlesetta Ivory, MD  Cholecalciferol (VITAMIN D3) 1000 UNITS CAPS Take 1,000 Units by mouth daily.    Yes [provider]  clonazePAM (KLONOPIN) 0.5 MG tablet Take 1 tablet (0.5 mg total) by mouth 2 (two) times daily as needed for up to 30 days for anxiety. 02/22/19 03/24/19 Yes Mercy Riding, MD  diltiazem (TIAZAC) 360 MG 24 hr capsule Take 360 mg by mouth daily.    Yes [provider]  hydrALAZINE (APRESOLINE) 25 MG tablet Take 3 tablets (75 mg total) by mouth 3 (three) times daily. 02/22/19  Yes Mercy Riding, MD  hydroxychloroquine (PLAQUENIL) 200 MG tablet Take 200 mg by mouth daily.   Yes [provider]  polyethylene glycol (MIRALAX / GLYCOLAX) 17 g packet Take 17 g by mouth daily as needed for mild constipation.   Yes [provider]  predniSONE (DELTASONE) 5 MG tablet Take 5 mg by mouth daily with breakfast. --  LONG-TERM USE FOR RA   Yes [provider]  traMADol (ULTRAM) 50 MG tablet Take 1 tablet (50 mg total) by mouth every 6 (six) hours as needed. Patient taking differently: Take 50 mg by mouth every 6 (six) hours as needed for moderate pain.  01/25/19  Yes Virgel Manifold, MD    ALLERGY: Allergies  Allergen Reactions   Latex Swelling and Other (See Comments)     fever blisters   Augmentin [Amoxicillin-Pot Clavulanate] Nausea And Vomiting   Doxycycline Other (See Comments)    Blurred vision, nervous, unsteady   Lactose Intolerance (Gi) Other (See Comments)    Unknown, per pt   Lialda [Mesalamine] Diarrhea   Prinivil [Lisinopril] Diarrhea   Triamcinolone Acetonide Other (See Comments)    REDNESS AND PAIN   Amlodipine Rash   Lotensin [Benazepril Hcl] Rash    Social History   Tobacco Use   Smoking status: Current Every Day Smoker    Packs/day: 0.25  Years: 50.00    Pack years: 12.50    Types: Cigarettes   Smokeless tobacco: Never Used    Substance Use Topics   Alcohol use: Yes    Alcohol/week: 2.0 standard drinks    Types: 2 Cans of beer per week     Family History  Problem Relation Age of Onset   Diabetes Mother    Hypertension Father    Lymphoma Son    Cancer Son        lymphoblastic lymphoma     ROS   ROS  Exam   Vitals:   03/12/19 0645 03/12/19 0830  BP: 133/60 (!) 174/78  Pulse: 72 80  Resp: 14 14  Temp:  97.8 F (36.6 C)  SpO2: 98% 100%   General appearance: elderly female, resting comfortably, multiple bruises, right periorbital hematoma Eyes: No scleral injection Cardiovascular: Regular rate and rhythm without murmurs, rubs, gallops. No edema or variciosities. Distal pulses normal. Pulmonary: Effort normal, non-labored breathing Musculoskeletal:     Muscle tone upper extremities: Normal    Muscle tone lower extremities: Normal    Motor exam: 5/5 BUE, 5/5 BLE with exception of pain mediated IP and quad weakness Neurological Mental Status:    - Patient is awake, alert, oriented to person, place, month, year, and situation    - Patient is able to give a clear and coherent history.    - No signs of aphasia or neglect Cranial Nerves    - II: Visual Fields are full. PERRL    - III/IV/VI: EOMI without ptosis or diploplia.     - V: Facial sensation is grossly normal    - VII: Facial movement is symmetric.     - VIII: hearing is intact to voice    - X: Uvula elevates symmetrically    - XI: Shoulder shrug is symmetric.    - XII: tongue is midline without atrophy or fasciculations.  Sensory: Sensation grossly intact to LT  Results - Imaging/Labs   Results for orders placed or performed during the hospital encounter of 03/12/19 (from the past 48 hour(s))  CBC with Differential/Platelet     Status: Abnormal   Collection Time: 03/12/19  4:03 AM  Result Value Ref Range   WBC 11.9 (H) 4.0 - 10.5 K/uL   RBC 2.96 (L) 3.87 - 5.11 MIL/uL   Hemoglobin 10.8 (L) 12.0 - 15.0 g/dL   HCT 29.8 (L)  36.0 - 46.0 %   MCV 100.7 (H) 80.0 - 100.0 fL   MCH 36.5 (H) 26.0 - 34.0 pg   MCHC 36.2 (H) 30.0 - 36.0 g/dL   RDW 15.0 11.5 - 15.5 %   Platelets 225 150 - 400 K/uL   nRBC 0.0 0.0 - 0.2 %   Neutrophils Relative % 83 %   Neutro Abs 9.9 (H) 1.7 - 7.7 K/uL   Lymphocytes Relative 9 %   Lymphs Abs 1.1 0.7 - 4.0 K/uL   Monocytes Relative 7 %   Monocytes Absolute 0.8 0.1 - 1.0 K/uL   Eosinophils Relative 0 %   Eosinophils Absolute 0.0 0.0 - 0.5 K/uL   Basophils Relative 0 %   Basophils Absolute 0.0 0.0 - 0.1 K/uL   Immature Granulocytes 1 %   Abs Immature Granulocytes 0.12 (H) 0.00 - 0.07 K/uL    Comment: Performed at [facility], [address] [place], [place] [address]  Basic metabolic panel     Status: Abnormal   Collection Time: 03/12/19  4:03 AM  Result Value Ref Range   Sodium 118 (LL) 135 - 145 mmol/L    Comment: CRITICAL RESULT CALLED TO, READ BACK BY AND VERIFIED WITH: BAILIFF,B RN 03/12/2019 0509 JORDANS    Potassium 3.3 (L) 3.5 - 5.1 mmol/L   Chloride 85 (L) 98 - 111 mmol/L   CO2 22 22 - 32 mmol/L   Glucose, Bld 99 70 - 99 mg/dL   BUN 15 8 - 23 mg/dL   Creatinine, Ser 0.99 0.44 - 1.00 mg/dL   Calcium 8.4 (L) 8.9 - 10.3 mg/dL   GFR calc non Af Amer 56 (L) >60 mL/min   GFR calc Af Amer >60 >60 mL/min   Anion gap 11 5 - 15    Comment: Performed at [facility] [address] [place], [place] [address]  Osmolality     Status: Abnormal   Collection Time: 03/12/19  4:12 AM  Result Value Ref Range   Osmolality 248 (LL) 275 - 295 mOsm/kg    Comment: CRITICAL RESULT CALLED TO, READ BACK BY AND VERIFIED WITH: RNMarlou Porch [id number] @0924  THANEY Performed at [facility] [address] [place], [place] [address]   SARS Coronavirus 2 (CEPHEID - Performed in [facility] hospital lab), Hosp Order     Status: None   Collection Time: 03/12/19  6:17 AM   Specimen: Nasopharyngeal Swab  Result Value Ref Range   SARS Coronavirus 2 NEGATIVE NEGATIVE     Comment: (NOTE) If result is NEGATIVE SARS-CoV-2 target nucleic acids are NOT DETECTED. The SARS-CoV-2 RNA is generally detectable in upper and lower  respiratory specimens during the acute phase of infection. The lowest  concentration of SARS-CoV-2 viral copies this assay can detect is 250  copies / mL. A negative result does not preclude SARS-CoV-2 infection  and should not be used as the sole basis for treatment or other  patient management decisions.  A negative result may occur with  improper specimen collection / handling, submission of specimen other  than nasopharyngeal swab, presence of viral mutation(s) within the  areas targeted by this assay, and inadequate number of viral copies  (<250 copies / mL). A negative result must be combined with clinical  observations, patient history, and epidemiological information. If result is POSITIVE SARS-CoV-2 target nucleic acids are DETECTED. The SARS-CoV-2 RNA is generally detectable in upper and lower  respiratory specimens dur ing the acute phase of infection.  Positive  results are indicative of active infection with SARS-CoV-2.  Clinical  correlation with patient history and other diagnostic information is  necessary to determine patient infection status.  Positive results do  not rule out bacterial infection or co-infection with other viruses. If result is PRESUMPTIVE POSTIVE SARS-CoV-2 nucleic acids MAY BE PRESENT.   A presumptive positive result was obtained on the submitted specimen  and confirmed on repeat testing.  While 2019 novel coronavirus  (SARS-CoV-2) nucleic acids may be present in the submitted sample  additional confirmatory testing may be necessary for epidemiological  and / or clinical management purposes  to differentiate between  SARS-CoV-2 and other Sarbecovirus currently known to infect humans.  If clinically indicated additional testing with an alternate test  methodology [phone]) is advised. The SARS-CoV-2  RNA is generally  detectable in upper and lower respiratory sp ecimens during the acute  phase of infection. The expected result is Negative. Fact Sheet for Patients:  StrictlyIdeas.no Fact Sheet for Healthcare Providers: BankingDealers.co.za This test is not yet approved or cleared by  the [employer] and has been authorized for detection and/or diagnosis of SARS-CoV-2 by FDA under an Emergency Use Authorization (EUA).  This EUA will remain in effect (meaning this test can be used) for the duration of the COVID-19 declaration under Section 564(b)(1) of the Act, 21 U.S.C. section 360bbb-3(b)(1), unless the authorization is terminated or revoked sooner. Performed at [facility], [place] [address] [place], [place] [address]     Dg Elbow Complete Left  Result Date: 03/12/2019 CLINICAL DATA:  Initial evaluation for acute trauma, fall. EXAM: LEFT ELBOW - COMPLETE 3+ VIEW COMPARISON:  None. FINDINGS: There is no evidence of fracture, dislocation, or joint effusion. There is no evidence of arthropathy or other focal bone abnormality. Soft tissues are unremarkable. IMPRESSION: No acute osseous abnormality about the left elbow. Electronically Signed   By: Jeannine Boga M.D.   On: 03/12/2019 04:50   Dg Elbow Complete Right  Result Date: 03/12/2019 CLINICAL DATA:  Initial evaluation for acute trauma, fall. EXAM: RIGHT ELBOW - COMPLETE 3+ VIEW COMPARISON:  None. FINDINGS: No acute fracture dislocation. No joint effusion. Radial head intact. Mild degenerative spurring present about the elbow. No visible soft tissue injury. IMPRESSION: No acute osseous abnormality about the right elbow. Electronically Signed   By: Jeannine Boga M.D.   On: 03/12/2019 04:52   Ct Head Wo Contrast  Result Date: 03/12/2019 CLINICAL DATA:  Pt BIB [facility] from home alone. Pt was changing her thermostat when she lost her balance and fell. Pt has hx of back  surgery on May 29 EXAM: CT HEAD WITHOUT CONTRAST CT MAXILLOFACIAL WITHOUT CONTRAST CT CERVICAL SPINE WITHOUT CONTRAST TECHNIQUE: Multidetector CT imaging of the head, cervical spine, and maxillofacial structures were performed using the standard protocol without intravenous contrast. Multiplanar CT image reconstructions of the cervical spine and maxillofacial structures were also generated. COMPARISON:  09/24/2017 FINDINGS: CT HEAD FINDINGS Brain: No evidence of acute infarction, hemorrhage, hydrocephalus, extra-axial collection or mass lesion/mass effect. Vascular: No hyperdense vessel or unexpected calcification. Skull: Normal. Negative for fracture or focal lesion. Other: None. CT MAXILLOFACIAL FINDINGS Osseous: No fracture or mandibular dislocation. No destructive process. Orbits: Negative. No traumatic or inflammatory finding. Sinuses: Clear. Soft tissues: Negative. CT CERVICAL SPINE FINDINGS Alignment: Normal. Skull base and vertebrae: No acute fracture. No primary bone lesion or focal pathologic process. Soft tissues and spinal canal: No prevertebral fluid or swelling. No visible canal hematoma. Disc levels: Mild loss of disc height at C4-C5 and C5-C6. Small endplate osteophytes at these levels. Minor disc bulging. No convincing disc herniation. There are facet degenerative changes most evident bilaterally at C5-C6. Upper chest: No acute findings. No masses or adenopathy. Mild centrilobular emphysema and scarring at the apices of the lungs. Other: None. IMPRESSION: HEAD CT 1. No intracranial abnormality. 2. No skull fracture. MAXILLOFACIAL CT 1. No fracture or acute finding. CERVICAL CT 1. No fracture or acute finding. Electronically Signed   By: Lajean Manes M.D.   On: 03/12/2019 04:39   Ct Cervical Spine Wo Contrast  Result Date: 03/12/2019 CLINICAL DATA:  Pt BIB [facility] from home alone. Pt was changing her thermostat when she lost her balance and fell. Pt has hx of back surgery on May 29 EXAM: CT HEAD  WITHOUT CONTRAST CT MAXILLOFACIAL WITHOUT CONTRAST CT CERVICAL SPINE WITHOUT CONTRAST TECHNIQUE: Multidetector CT imaging of the head, cervical spine, and maxillofacial structures were performed using the standard protocol without intravenous contrast. Multiplanar CT image reconstructions of the cervical spine and maxillofacial structures  were also generated. COMPARISON:  09/24/2017 FINDINGS: CT HEAD FINDINGS Brain: No evidence of acute infarction, hemorrhage, hydrocephalus, extra-axial collection or mass lesion/mass effect. Vascular: No hyperdense vessel or unexpected calcification. Skull: Normal. Negative for fracture or focal lesion. Other: None. CT MAXILLOFACIAL FINDINGS Osseous: No fracture or mandibular dislocation. No destructive process. Orbits: Negative. No traumatic or inflammatory finding. Sinuses: Clear. Soft tissues: Negative. CT CERVICAL SPINE FINDINGS Alignment: Normal. Skull base and vertebrae: No acute fracture. No primary bone lesion or focal pathologic process. Soft tissues and spinal canal: No prevertebral fluid or swelling. No visible canal hematoma. Disc levels: Mild loss of disc height at C4-C5 and C5-C6. Small endplate osteophytes at these levels. Minor disc bulging. No convincing disc herniation. There are facet degenerative changes most evident bilaterally at C5-C6. Upper chest: No acute findings. No masses or adenopathy. Mild centrilobular emphysema and scarring at the apices of the lungs. Other: None. IMPRESSION: HEAD CT 1. No intracranial abnormality. 2. No skull fracture. MAXILLOFACIAL CT 1. No fracture or acute finding. CERVICAL CT 1. No fracture or acute finding. Electronically Signed   By: Lajean Manes M.D.   On: 03/12/2019 04:39   Ct Lumbar Spine Wo Contrast  Result Date: 03/12/2019 CLINICAL DATA:  Per ed notes: Pt BIB [facility] from home alone. Pt was changing her thermostat when she lost her balance and fell. Pt has hx of back surgery on May 29 EXAM: CT LUMBAR SPINE WITHOUT  CONTRAST TECHNIQUE: Multidetector CT imaging of the lumbar spine was performed without intravenous contrast administration. Multiplanar CT image reconstructions were also generated. COMPARISON:  CT, 02/15/2019 FINDINGS: Segmentation: 5 lumbar type vertebrae. Alignment: Normal. Vertebrae: Choose 1 Paraspinal and other soft tissues: No masses or adenopathy. Chronic bilateral perinephric stranding similar to the prior CT. Dense aortic atherosclerotic calcifications. Disc levels: T12-L1 through L2-L3: No disc bulging or herniation. No stenosis. L3-L4: Mild spondylotic disc bulging. No disc herniation. No significant stenosis. L4-L5: Bilateral pedicle screws and interconnecting rods fuse L4-L5. The orthopedic hardware is well-seated and unchanged from the prior abdomen and pelvis CT. There is a disc spacer and bone graft material within the L4-L5 disc interspace and along the posterior elements. This all appears stable from the prior study. L5-S1: Moderate loss of disc height. Mild facet degenerative change. Spondylotic disc bulging, but no disc herniation. Mild right neural foraminal narrowing. IMPRESSION: 1. No fracture or acute finding. 2. Status post posterior lumbar spine fusion at L4-L5. No evidence of disruption of the orthopedic hardware. No change from the study dated 02/15/2019. Electronically Signed   By: Lajean Manes M.D.   On: 03/12/2019 04:44   Dg Pelvis Portable  Result Date: 03/12/2019 CLINICAL DATA:  Initial evaluation for acute trauma, fall. EXAM: PORTABLE PELVIS 1-2 VIEWS COMPARISON:  None. FINDINGS: Curvilinear lucency extends through the right pubis symphysis, favored to reflect summation of overlying soft tissue shadow, although an acute nondisplaced fracture not entirely excluded. No pubic diastasis. Remainder the bony pelvis intact. SI joints approximated. No abnormality about the hips. Prior fusion noted at L4-5. Degenerative changes noted within the lower lumbar spine. Mild osteoarthritic  changes about the hips. No visible soft tissue injury. Scattered vascular calcifications noted. IMPRESSION: 1. Curvilinear lucency extending through the right pubis symphysis, favored to reflect summation of overlying soft tissue shadow, although an acute nondisplaced fracture not entirely excluded. Correlation with physical exam recommended. 2. No other acute osseous abnormality about the pelvis. Electronically Signed   By: Jeannine Boga M.D.   On: 03/12/2019 04:54  Dg Chest Portable 1 View  Result Date: 03/12/2019 CLINICAL DATA:  Initial evaluation for acute trauma, fall. EXAM: PORTABLE CHEST 1 VIEW COMPARISON:  Prior radiograph from 09/12/2018 FINDINGS: Transverse heart size at the upper limits of normal. Mediastinal silhouette within normal limits. Aortic atherosclerosis. Lungs mildly hypoinflated. No focal infiltrates. No edema or effusion. No pneumothorax. No acute osseous abnormality. Diffuse osteopenia. IMPRESSION: 1. No active cardiopulmonary disease. 2. Aortic atherosclerosis. Electronically Signed   By: Jeannine Boga M.D.   On: 03/12/2019 04:48   Ct Maxillofacial Wo Contrast  Result Date: 03/12/2019 CLINICAL DATA:  Pt BIB [facility] from home alone. Pt was changing her thermostat when she lost her balance and fell. Pt has hx of back surgery on May 29 EXAM: CT HEAD WITHOUT CONTRAST CT MAXILLOFACIAL WITHOUT CONTRAST CT CERVICAL SPINE WITHOUT CONTRAST TECHNIQUE: Multidetector CT imaging of the head, cervical spine, and maxillofacial structures were performed using the standard protocol without intravenous contrast. Multiplanar CT image reconstructions of the cervical spine and maxillofacial structures were also generated. COMPARISON:  09/24/2017 FINDINGS: CT HEAD FINDINGS Brain: No evidence of acute infarction, hemorrhage, hydrocephalus, extra-axial collection or mass lesion/mass effect. Vascular: No hyperdense vessel or unexpected calcification. Skull: Normal. Negative for fracture or  focal lesion. Other: None. CT MAXILLOFACIAL FINDINGS Osseous: No fracture or mandibular dislocation. No destructive process. Orbits: Negative. No traumatic or inflammatory finding. Sinuses: Clear. Soft tissues: Negative. CT CERVICAL SPINE FINDINGS Alignment: Normal. Skull base and vertebrae: No acute fracture. No primary bone lesion or focal pathologic process. Soft tissues and spinal canal: No prevertebral fluid or swelling. No visible canal hematoma. Disc levels: Mild loss of disc height at C4-C5 and C5-C6. Small endplate osteophytes at these levels. Minor disc bulging. No convincing disc herniation. There are facet degenerative changes most evident bilaterally at C5-C6. Upper chest: No acute findings. No masses or adenopathy. Mild centrilobular emphysema and scarring at the apices of the lungs. Other: None. IMPRESSION: HEAD CT 1. No intracranial abnormality. 2. No skull fracture. MAXILLOFACIAL CT 1. No fracture or acute finding. CERVICAL CT 1. No fracture or acute finding. Electronically Signed   By: Lajean Manes M.D.   On: 03/12/2019 04:39    Impression/Plan   73 y.o. female with persistent LLE and lower back pain since L4-5 fusion. She is grossly neurologically intact although has mild pain mediated weakness.  Would recommend repeat MRI w/wo contrast to further evaluate her persistent back and LLE pain. Will place order. This is not urgent. No indication for emergent NS intervention. Dr Ellene Route can f/u on MRI when he returns Monday. Rec pain control.  Patient has foley catheter. Will order UA and urine culture to r/o infection.  Ferne Reus, PA-C [facility] [employer]

## 2019-03-12 NOTE — ED Notes (Signed)
ED TO INPATIENT HANDOFF REPORT  ED Nurse Name and Phone #: [phone]  S Name/Age/Gender Regina Ortiz 73 y.o. female Room/Bed: 018C/018C  Code Status   Code Status: Prior  Home/SNF/Other Home Patient oriented to: self, place, time and situation Is this baseline? Yes   Triage Complete: Triage complete  Chief Complaint back pain   Triage Note Pt BIB [facility] from home alone. Pt was changing her thermostat when she lost her balance and fell. Pt has hx of back surgery on May 29. Pt has foley in place. Pt has multiple skin tears to bilateral arms and legs from crawling on floor to phone. Pt alert and oriented at this time, unable to ambulate.    Allergies Allergies  Allergen Reactions  . Latex Swelling and Other (See Comments)     fever blisters  . Augmentin [Amoxicillin-Pot Clavulanate] Nausea And Vomiting  . Doxycycline Other (See Comments)    Blurred vision, nervous, unsteady  . Lactose Intolerance (Gi) Other (See Comments)    Unknown, per pt  . Lialda [Mesalamine] Diarrhea  . Prinivil [Lisinopril] Diarrhea  . Triamcinolone Acetonide Other (See Comments)    REDNESS AND PAIN  . Amlodipine Rash  . Lotensin [Benazepril Hcl] Rash    Level of Care/Admitting Diagnosis ED Disposition    ED Disposition Condition Comment   Admit  Hospital Area: [facility] [[id number]]  Level of Care: Telemetry Medical [104]  I expect the patient will be discharged within 24 hours: No (not a candidate for 5C-Observation unit)  Covid Evaluation: Asymptomatic Screening Protocol (No Symptoms)  Diagnosis: Hyponatremia [759163]  Admitting Physician: Rise Patience [phone]  Attending Physician: Rise Patience [[id number]]  PT Class (Do Not Modify): Observation [104]  PT Acc Code (Do Not Modify): Observation [10022]       B Medical/Surgery History Past Medical History:  Diagnosis Date  . AIN grade I   . Anxiety   . Bruises easily   . Chronic diarrhea   . Chronic  hyponatremia   . Colitis   . COPD (chronic obstructive pulmonary disease) ([place])   . Depression   . GERD (gastroesophageal reflux disease)   . History of chronic bronchitis   . History of vulvar dysplasia    serveral recurrency's   . Hypertension   . LBBB (left bundle branch block) W/ PROLONGED PR   CARDIOLOGSIT-  DR Johnsie Cancel-   LOV IN EPIC  . Macrocytosis   . Nocturia   . Osteoporosis   . Seronegative rheumatoid arthritis ([place])   . Short of breath on exertion   . Smokers' cough ([place])   . Venous insufficiency of leg    , Edema  . VIN III (vulvar intraepithelial neoplasia III)    RECURRENT -   Past Surgical History:  Procedure Laterality Date  . APPENDECTOMY  1992  . CARDIOVASCULAR STRESS TEST  06-23-2012  DR Jenkins [place]   NORMAL LEXISCAN MYOVIEW/ EF 83%/  NO ISCHEMIA  . CO2 LASER APPLICATION  59/93/5701   Procedure: CO2 LASER APPLICATION;  Surgeon: Janie Morning, MD PHD;  Location: [facility];  Service: Gynecology;  Laterality: N/A;  Laser Vaporization  . CO2 LASER APPLICATION N/A 03/07/9389   Procedure: CO2 LASER APPLICATION;  Surgeon: [name], MD;  Location: [facility];  Service: Gynecology;  Laterality: N/A;  . CO2 LASER APPLICATION N/A 3/00/9233   Procedure: CO2 LASER APPLICATION of the vulva;  Surgeon: Janie Morning, MD;  Location: [facility];  Service:  Gynecology;  Laterality: N/A;  . EXCISION VULVAR LESIONS  07/2012  . HEMORRHOID SURGERY N/A 04/15/2013   Procedure: Excision of external and internal  hemorrhoid with AIN, Exam under anesthesia;  Surgeon: Odis Hollingshead, MD;  Location: [facility];  Service: General;  Laterality: N/A;  . REPAIR FISTULA IN ANO/  I & D PERIRECTAL ABSCESS  10-26-2002  DR Johnathan Hausen  . TRANSTHORACIC ECHOCARDIOGRAM  06-23-2012   NORMAL LVSF/ EF 55-60%  . VAGINAL HYSTERECTOMY  1997   partial  . VULVAR LESION REMOVAL N/A 11/03/2014   Procedure: CO2 LASER OF VULVAR ;  Surgeon: [name], MD;   Location: [facility];  Service: Gynecology;  Laterality: N/A;  . VULVECTOMY N/A 03/26/2015   Procedure: WIDE LOCA  EXCISION OF VULVAR;  Surgeon: [name], MD;  Location: [facility];  Service: Gynecology;  Laterality: N/A;  . Wide Local Excision of labia majora  04-04-2010   Right-sided lesion, CO2 ablation of right labia minora     A IV Location/Drains/Wounds Patient Lines/Drains/Airways Status   Active Line/Drains/Airways    Name:   Placement date:   Placement time:   Site:   Days:   Peripheral IV 03/12/19 Right;Upper Arm   03/12/19    0606    Arm   less than 1   Urethral Catheter Loleta Dicker RN Straight-tip 16 Fr.   02/19/19    0100    Straight-tip   21   Incision (Closed) 01/28/19 Back Other (Comment)   01/28/19    2037     43   Pressure Injury 01/28/19 Coccyx Mid Stage I -  Intact skin with non-blanchable redness of a localized area usually over a bony prominence.   01/28/19    2223     43          Intake/Output Last 24 hours  Intake/Output Summary (Last 24 hours) at 03/12/2019 6294 Last data filed at 03/12/2019 0324 Gross per 24 hour  Intake -  Output 300 ml  Net -300 ml    Labs/Imaging Results for orders placed or performed during the hospital encounter of 03/12/19 (from the past 48 hour(s))  CBC with Differential/Platelet     Status: Abnormal   Collection Time: 03/12/19  4:03 AM  Result Value Ref Range   WBC 11.9 (H) 4.0 - 10.5 K/uL   RBC 2.96 (L) 3.87 - 5.11 MIL/uL   Hemoglobin 10.8 (L) 12.0 - 15.0 g/dL   HCT 29.8 (L) 36.0 - 46.0 %   MCV 100.7 (H) 80.0 - 100.0 fL   MCH 36.5 (H) 26.0 - 34.0 pg   MCHC 36.2 (H) 30.0 - 36.0 g/dL   RDW 15.0 11.5 - 15.5 %   Platelets 225 150 - 400 K/uL   nRBC 0.0 0.0 - 0.2 %   Neutrophils Relative % 83 %   Neutro Abs 9.9 (H) 1.7 - 7.7 K/uL   Lymphocytes Relative 9 %   Lymphs Abs 1.1 0.7 - 4.0 K/uL   Monocytes Relative 7 %   Monocytes Absolute 0.8 0.1 - 1.0 K/uL   Eosinophils Relative 0 %    Eosinophils Absolute 0.0 0.0 - 0.5 K/uL   Basophils Relative 0 %   Basophils Absolute 0.0 0.0 - 0.1 K/uL   Immature Granulocytes 1 %   Abs Immature Granulocytes 0.12 (H) 0.00 - 0.07 K/uL    Comment: Performed at [facility], [address] [place], [place] [address]  Basic metabolic panel  Status: Abnormal   Collection Time: 03/12/19  4:03 AM  Result Value Ref Range   Sodium 118 (LL) 135 - 145 mmol/L    Comment: CRITICAL RESULT CALLED TO, READ BACK BY AND VERIFIED WITH: Shaniyah Wix,B RN 03/12/2019 0509 JORDANS    Potassium 3.3 (L) 3.5 - 5.1 mmol/L   Chloride 85 (L) 98 - 111 mmol/L   CO2 22 22 - 32 mmol/L   Glucose, Bld 99 70 - 99 mg/dL   BUN 15 8 - 23 mg/dL   Creatinine, Ser 0.99 0.44 - 1.00 mg/dL   Calcium 8.4 (L) 8.9 - 10.3 mg/dL   GFR calc non Af Amer 56 (L) >60 mL/min   GFR calc Af Amer >60 >60 mL/min   Anion gap 11 5 - 15    Comment: Performed at [facility] [address], [place], [place] [address]   Dg Elbow Complete Left  Result Date: 03/12/2019 CLINICAL DATA:  Initial evaluation for acute trauma, fall. EXAM: LEFT ELBOW - COMPLETE 3+ VIEW COMPARISON:  None. FINDINGS: There is no evidence of fracture, dislocation, or joint effusion. There is no evidence of arthropathy or other focal bone abnormality. Soft tissues are unremarkable. IMPRESSION: No acute osseous abnormality about the left elbow. Electronically Signed   By: Jeannine Boga M.D.   On: 03/12/2019 04:50   Dg Elbow Complete Right  Result Date: 03/12/2019 CLINICAL DATA:  Initial evaluation for acute trauma, fall. EXAM: RIGHT ELBOW - COMPLETE 3+ VIEW COMPARISON:  None. FINDINGS: No acute fracture dislocation. No joint effusion. Radial head intact. Mild degenerative spurring present about the elbow. No visible soft tissue injury. IMPRESSION: No acute osseous abnormality about the right elbow. Electronically Signed   By: Jeannine Boga M.D.   On: 03/12/2019 04:52   Ct Head Wo Contrast  Result  Date: 03/12/2019 CLINICAL DATA:  Pt BIB [facility] from home alone. Pt was changing her thermostat when she lost her balance and fell. Pt has hx of back surgery on May 29 EXAM: CT HEAD WITHOUT CONTRAST CT MAXILLOFACIAL WITHOUT CONTRAST CT CERVICAL SPINE WITHOUT CONTRAST TECHNIQUE: Multidetector CT imaging of the head, cervical spine, and maxillofacial structures were performed using the standard protocol without intravenous contrast. Multiplanar CT image reconstructions of the cervical spine and maxillofacial structures were also generated. COMPARISON:  09/24/2017 FINDINGS: CT HEAD FINDINGS Brain: No evidence of acute infarction, hemorrhage, hydrocephalus, extra-axial collection or mass lesion/mass effect. Vascular: No hyperdense vessel or unexpected calcification. Skull: Normal. Negative for fracture or focal lesion. Other: None. CT MAXILLOFACIAL FINDINGS Osseous: No fracture or mandibular dislocation. No destructive process. Orbits: Negative. No traumatic or inflammatory finding. Sinuses: Clear. Soft tissues: Negative. CT CERVICAL SPINE FINDINGS Alignment: Normal. Skull base and vertebrae: No acute fracture. No primary bone lesion or focal pathologic process. Soft tissues and spinal canal: No prevertebral fluid or swelling. No visible canal hematoma. Disc levels: Mild loss of disc height at C4-C5 and C5-C6. Small endplate osteophytes at these levels. Minor disc bulging. No convincing disc herniation. There are facet degenerative changes most evident bilaterally at C5-C6. Upper chest: No acute findings. No masses or adenopathy. Mild centrilobular emphysema and scarring at the apices of the lungs. Other: None. IMPRESSION: HEAD CT 1. No intracranial abnormality. 2. No skull fracture. MAXILLOFACIAL CT 1. No fracture or acute finding. CERVICAL CT 1. No fracture or acute finding. Electronically Signed   By: Lajean Manes M.D.   On: 03/12/2019 04:39   Ct Cervical Spine Wo Contrast  Result Date: 03/12/2019 CLINICAL DATA:  Pt BIB [facility] from home alone. Pt was changing her thermostat when she lost her balance and fell. Pt has hx of back surgery on May 29 EXAM: CT HEAD WITHOUT CONTRAST CT MAXILLOFACIAL WITHOUT CONTRAST CT CERVICAL SPINE WITHOUT CONTRAST TECHNIQUE: Multidetector CT imaging of the head, cervical spine, and maxillofacial structures were performed using the standard protocol without intravenous contrast. Multiplanar CT image reconstructions of the cervical spine and maxillofacial structures were also generated. COMPARISON:  09/24/2017 FINDINGS: CT HEAD FINDINGS Brain: No evidence of acute infarction, hemorrhage, hydrocephalus, extra-axial collection or mass lesion/mass effect. Vascular: No hyperdense vessel or unexpected calcification. Skull: Normal. Negative for fracture or focal lesion. Other: None. CT MAXILLOFACIAL FINDINGS Osseous: No fracture or mandibular dislocation. No destructive process. Orbits: Negative. No traumatic or inflammatory finding. Sinuses: Clear. Soft tissues: Negative. CT CERVICAL SPINE FINDINGS Alignment: Normal. Skull base and vertebrae: No acute fracture. No primary bone lesion or focal pathologic process. Soft tissues and spinal canal: No prevertebral fluid or swelling. No visible canal hematoma. Disc levels: Mild loss of disc height at C4-C5 and C5-C6. Small endplate osteophytes at these levels. Minor disc bulging. No convincing disc herniation. There are facet degenerative changes most evident bilaterally at C5-C6. Upper chest: No acute findings. No masses or adenopathy. Mild centrilobular emphysema and scarring at the apices of the lungs. Other: None. IMPRESSION: HEAD CT 1. No intracranial abnormality. 2. No skull fracture. MAXILLOFACIAL CT 1. No fracture or acute finding. CERVICAL CT 1. No fracture or acute finding. Electronically Signed   By: Lajean Manes M.D.   On: 03/12/2019 04:39   Ct Lumbar Spine Wo Contrast  Result Date: 03/12/2019 CLINICAL DATA:  Per ed notes: Pt BIB [facility] from  home alone. Pt was changing her thermostat when she lost her balance and fell. Pt has hx of back surgery on May 29 EXAM: CT LUMBAR SPINE WITHOUT CONTRAST TECHNIQUE: Multidetector CT imaging of the lumbar spine was performed without intravenous contrast administration. Multiplanar CT image reconstructions were also generated. COMPARISON:  CT, 02/15/2019 FINDINGS: Segmentation: 5 lumbar type vertebrae. Alignment: Normal. Vertebrae: Choose 1 Paraspinal and other soft tissues: No masses or adenopathy. Chronic bilateral perinephric stranding similar to the prior CT. Dense aortic atherosclerotic calcifications. Disc levels: T12-L1 through L2-L3: No disc bulging or herniation. No stenosis. L3-L4: Mild spondylotic disc bulging. No disc herniation. No significant stenosis. L4-L5: Bilateral pedicle screws and interconnecting rods fuse L4-L5. The orthopedic hardware is well-seated and unchanged from the prior abdomen and pelvis CT. There is a disc spacer and bone graft material within the L4-L5 disc interspace and along the posterior elements. This all appears stable from the prior study. L5-S1: Moderate loss of disc height. Mild facet degenerative change. Spondylotic disc bulging, but no disc herniation. Mild right neural foraminal narrowing. IMPRESSION: 1. No fracture or acute finding. 2. Status post posterior lumbar spine fusion at L4-L5. No evidence of disruption of the orthopedic hardware. No change from the study dated 02/15/2019. Electronically Signed   By: Lajean Manes M.D.   On: 03/12/2019 04:44   Dg Pelvis Portable  Result Date: 03/12/2019 CLINICAL DATA:  Initial evaluation for acute trauma, fall. EXAM: PORTABLE PELVIS 1-2 VIEWS COMPARISON:  None. FINDINGS: Curvilinear lucency extends through the right pubis symphysis, favored to reflect summation of overlying soft tissue shadow, although an acute nondisplaced fracture not entirely excluded. No pubic diastasis. Remainder the bony pelvis intact. SI joints  approximated. No abnormality about the hips. Prior fusion noted at L4-5. Degenerative changes noted within the lower lumbar  spine. Mild osteoarthritic changes about the hips. No visible soft tissue injury. Scattered vascular calcifications noted. IMPRESSION: 1. Curvilinear lucency extending through the right pubis symphysis, favored to reflect summation of overlying soft tissue shadow, although an acute nondisplaced fracture not entirely excluded. Correlation with physical exam recommended. 2. No other acute osseous abnormality about the pelvis. Electronically Signed   By: Jeannine Boga M.D.   On: 03/12/2019 04:54   Dg Chest Portable 1 View  Result Date: 03/12/2019 CLINICAL DATA:  Initial evaluation for acute trauma, fall. EXAM: PORTABLE CHEST 1 VIEW COMPARISON:  Prior radiograph from 09/12/2018 FINDINGS: Transverse heart size at the upper limits of normal. Mediastinal silhouette within normal limits. Aortic atherosclerosis. Lungs mildly hypoinflated. No focal infiltrates. No edema or effusion. No pneumothorax. No acute osseous abnormality. Diffuse osteopenia. IMPRESSION: 1. No active cardiopulmonary disease. 2. Aortic atherosclerosis. Electronically Signed   By: Jeannine Boga M.D.   On: 03/12/2019 04:48   Ct Maxillofacial Wo Contrast  Result Date: 03/12/2019 CLINICAL DATA:  Pt BIB [facility] from home alone. Pt was changing her thermostat when she lost her balance and fell. Pt has hx of back surgery on May 29 EXAM: CT HEAD WITHOUT CONTRAST CT MAXILLOFACIAL WITHOUT CONTRAST CT CERVICAL SPINE WITHOUT CONTRAST TECHNIQUE: Multidetector CT imaging of the head, cervical spine, and maxillofacial structures were performed using the standard protocol without intravenous contrast. Multiplanar CT image reconstructions of the cervical spine and maxillofacial structures were also generated. COMPARISON:  09/24/2017 FINDINGS: CT HEAD FINDINGS Brain: No evidence of acute infarction, hemorrhage, hydrocephalus,  extra-axial collection or mass lesion/mass effect. Vascular: No hyperdense vessel or unexpected calcification. Skull: Normal. Negative for fracture or focal lesion. Other: None. CT MAXILLOFACIAL FINDINGS Osseous: No fracture or mandibular dislocation. No destructive process. Orbits: Negative. No traumatic or inflammatory finding. Sinuses: Clear. Soft tissues: Negative. CT CERVICAL SPINE FINDINGS Alignment: Normal. Skull base and vertebrae: No acute fracture. No primary bone lesion or focal pathologic process. Soft tissues and spinal canal: No prevertebral fluid or swelling. No visible canal hematoma. Disc levels: Mild loss of disc height at C4-C5 and C5-C6. Small endplate osteophytes at these levels. Minor disc bulging. No convincing disc herniation. There are facet degenerative changes most evident bilaterally at C5-C6. Upper chest: No acute findings. No masses or adenopathy. Mild centrilobular emphysema and scarring at the apices of the lungs. Other: None. IMPRESSION: HEAD CT 1. No intracranial abnormality. 2. No skull fracture. MAXILLOFACIAL CT 1. No fracture or acute finding. CERVICAL CT 1. No fracture or acute finding. Electronically Signed   By: Lajean Manes M.D.   On: 03/12/2019 04:39    Pending Labs Unresulted Labs (From admission, onward)    Start     Ordered   03/12/19 0615  SARS Coronavirus 2 (CEPHEID - Performed in [place] hospital lab), Hosp Order  (Asymptomatic Patients Labs)  Once,   STAT    Question:  Rule Out  Answer:  Yes   03/12/19 0614          Vitals/Pain Today's Vitals   03/12/19 0125 03/12/19 0136 03/12/19 0605 03/12/19 0612  BP:  (!) 144/60 (!) 162/76   Pulse:  68 77   Resp:  18 15   Temp:  97.6 F (36.4 C)    TempSrc:  Oral    SpO2: 97% 95% 99%   PainSc:    10-Worst pain ever    Isolation Precautions No active isolations  Medications Medications  0.9 %  sodium chloride infusion ( Intravenous New Bag/Given 03/12/19 0606)  acetaminophen (TYLENOL) tablet 650  mg (650 mg Oral Given 03/12/19 0547)    Mobility walks with person assist Low fall risk   Focused Assessments Fall / Chronic Foley   R Recommendations: See Admitting Provider Note  Report given to:   Additional Notes: N/A

## 2019-03-12 NOTE — ED Triage Notes (Signed)
Pt BIB [facility] from home alone. Pt was changing her thermostat when she lost her balance and fell. Pt has hx of back surgery on May 29. Pt has foley in place. Pt has multiple skin tears to bilateral arms and legs from crawling on floor to phone. Pt alert and oriented at this time, unable to ambulate.

## 2019-03-12 NOTE — ED Notes (Signed)
Nurse drawing labs. 

## 2019-03-12 NOTE — ED Notes (Signed)
Pt's caretaker is Verlin Grills. She cares for her during the day. Phone number: [phone]. Pt's neighbor Rayna Sexton number is [phone]

## 2019-03-12 NOTE — ED Provider Notes (Signed)
[facility] EMERGENCY DEPARTMENT Provider Note   CSN: [id number] Arrival date & time: 03/12/19  0128     History   Chief Complaint Chief Complaint  Patient presents with  . Back Pain  . Fall    HPI Regina Ortiz is a 73 y.o. female.     73 year old with history of herniated disc L4/L5 with cauda equina syndrome status post decompression 01/28/2019, chronic hyponatremia, GERD, essential hypertension, rheumatoid arthritis, chronic venous insufficiency, presenting from home after a fall.  She has no caregiver at night only during the day.  States she was feeling hot so she went to change the thermostat while using her walker.  She then lost her balance and the walker tipped over and she fell striking her elbows, head, face, back and knees.  She denies losing consciousness.  She denies any nausea or vomiting.  Is not take any blood thinners.  Since the fall she complains of worsening head pain, face pain, neck and back pain.  She also feels that she is having more pain and numbness and weakness in her left leg which is progressed since the surgery.  She denies losing consciousness.  She denies any preceding dizziness or lightheadedness.  She has a Foley catheter in place for urinary retention.  States she is had intermittent pain in her left leg since the surgery with numbness and tingling as well which is progressively worsening.  States it comes and goes but is particularly worse today after she had a fall.  The history is provided by the patient.  Back Pain Associated symptoms: headaches and weakness   Associated symptoms: no abdominal pain, no dysuria and no fever   Fall Associated symptoms include headaches. Pertinent negatives include no abdominal pain and no shortness of breath.    Past Medical History:  Diagnosis Date  . AIN grade I   . Anxiety   . Bruises easily   . Chronic diarrhea   . Chronic hyponatremia   . Colitis   . COPD (chronic obstructive  pulmonary disease) ([place])   . Depression   . GERD (gastroesophageal reflux disease)   . History of chronic bronchitis   . History of vulvar dysplasia    serveral recurrency's   . Hypertension   . LBBB (left bundle branch block) W/ PROLONGED PR   CARDIOLOGSIT-  DR Johnsie Cancel-   LOV IN EPIC  . Macrocytosis   . Nocturia   . Osteoporosis   . Seronegative rheumatoid arthritis ([place])   . Short of breath on exertion   . Smokers' cough ([place])   . Venous insufficiency of leg    , Edema  . VIN III (vulvar intraepithelial neoplasia III)    RECURRENT -    Patient Active Problem List   Diagnosis Date Noted  . Urinary retention 02/15/2019  . Lumbosacral radiculopathy 02/15/2019  . Constipation 02/15/2019  . Acute urinary retention 02/15/2019  . Cauda equina syndrome ([place]) 01/28/2019  . Chest pain 09/25/2017  . Hypertensive urgency 09/24/2017  . Headache 09/24/2017  . Alcohol use 09/24/2017  . AKI (acute kidney injury) ([place]) 09/24/2017  . Bilateral carotid artery stenosis   . Vulvovaginal candidiasis 10/22/2015  . Mild malnutrition ([place]) 07/07/2013  . Chronic bronchitis ([place]) 07/07/2013  . Tobacco abuse 07/07/2013  . Hypertension   . Short of breath on exertion   . Hemorrhoid   . LBBB (left bundle branch block)   . Anxiety   . Depression   . RA (rheumatoid arthritis) ([place])   .  Venous insufficiency of leg   . Chronic hyponatremia   . AIN grade I 04/05/2013  . Diarrhea 09/22/2012  . Hyponatremia 09/22/2012  . Hypokalemia 09/22/2012  . ARF (acute renal failure) ([place]) 09/22/2012  . Atypical chest pain 06/22/2012  . Anemia 06/22/2012  . Vulvar intraepithelial neoplasia III (VIN III) 01/29/2012    Past Surgical History:  Procedure Laterality Date  . APPENDECTOMY  1992  . CARDIOVASCULAR STRESS TEST  06-23-2012  DR Jenkins [place]   NORMAL LEXISCAN MYOVIEW/ EF 83%/  NO ISCHEMIA  . CO2 LASER APPLICATION  09/98/3382   Procedure: CO2 LASER APPLICATION;  Surgeon: Janie Morning, MD PHD;   Location: [facility];  Service: Gynecology;  Laterality: N/A;  Laser Vaporization  . CO2 LASER APPLICATION N/A 5/0/5397   Procedure: CO2 LASER APPLICATION;  Surgeon: [name], MD;  Location: [facility];  Service: Gynecology;  Laterality: N/A;  . CO2 LASER APPLICATION N/A 6/73/4193   Procedure: CO2 LASER APPLICATION of the vulva;  Surgeon: Janie Morning, MD;  Location: [facility];  Service: Gynecology;  Laterality: N/A;  . EXCISION VULVAR LESIONS  07/2012  . HEMORRHOID SURGERY N/A 04/15/2013   Procedure: Excision of external and internal  hemorrhoid with AIN, Exam under anesthesia;  Surgeon: Odis Hollingshead, MD;  Location: [facility];  Service: General;  Laterality: N/A;  . REPAIR FISTULA IN ANO/  I & D PERIRECTAL ABSCESS  10-26-2002  DR Johnathan Hausen  . TRANSTHORACIC ECHOCARDIOGRAM  06-23-2012   NORMAL LVSF/ EF 55-60%  . VAGINAL HYSTERECTOMY  1997   partial  . VULVAR LESION REMOVAL N/A 11/03/2014   Procedure: CO2 LASER OF VULVAR ;  Surgeon: [name], MD;  Location: [facility];  Service: Gynecology;  Laterality: N/A;  . VULVECTOMY N/A 03/26/2015   Procedure: WIDE LOCA  EXCISION OF VULVAR;  Surgeon: [name], MD;  Location: [facility];  Service: Gynecology;  Laterality: N/A;  . Wide Local Excision of labia majora  04-04-2010   Right-sided lesion, CO2 ablation of right labia minora     OB History   No obstetric history on file.      Home Medications    Prior to Admission medications   Medication Sig Start Date End Date Taking? Authorizing Provider  acetaminophen (TYLENOL) 325 MG tablet Take 2 tablets (650 mg total) by mouth every 6 (six) hours. 02/22/19   Mercy Riding, MD  alendronate (FOSAMAX) 70 MG tablet Take 70 mg by mouth every Monday.  11/29/18   [provider]  atorvastatin (LIPITOR) 40 MG tablet Take 1 tablet by mouth once daily 12/22/18   Jerline Pain, MD  busPIRone (BUSPAR) 30 MG  tablet Take 30 mg by mouth 2 (two) times daily.  11/20/14   [provider]  Calcium Carbonate-Vitamin D (CALTRATE 600+D PO) Take 1 tablet by mouth daily.     [provider]  carvedilol (COREG) 6.25 MG tablet Take 1 tablet (6.25 mg total) by mouth 2 (two) times daily with a meal. 02/22/19 02/22/20  Mercy Riding, MD  Cholecalciferol (VITAMIN D3) 1000 UNITS CAPS Take 1,000 Units by mouth daily.     [provider]  clonazePAM (KLONOPIN) 0.5 MG tablet Take 1 tablet (0.5 mg total) by mouth 2 (two) times daily as needed for up to 30 days for anxiety. 02/22/19 03/24/19  Mercy Riding, MD  diltiazem (TIAZAC) 360 MG 24 hr capsule Take 360 mg by mouth daily.  [provider]  hydrALAZINE (APRESOLINE) 25 MG tablet Take 3 tablets (75 mg total) by mouth 3 (three) times daily. 02/22/19   Mercy Riding, MD  hydroxychloroquine (PLAQUENIL) 200 MG tablet Take 200 mg by mouth daily.    [provider]  polyethylene glycol (MIRALAX / GLYCOLAX) 17 g packet Take 17 g by mouth daily as needed for mild constipation.    [provider]  predniSONE (DELTASONE) 5 MG tablet Take 5 mg by mouth daily with breakfast. --  LONG-TERM USE FOR RA    [provider]  traMADol (ULTRAM) 50 MG tablet Take 1 tablet (50 mg total) by mouth every 6 (six) hours as needed. 01/25/19   Virgel Manifold, MD    Family History Family History  Problem Relation Age of Onset  . Diabetes Mother   . Hypertension Father   . Lymphoma Son   . Cancer Son        lymphoblastic lymphoma    Social History Social History   Tobacco Use  . Smoking status: Current Every Day Smoker    Packs/day: 0.25    Years: 50.00    Pack years: 12.50    Types: Cigarettes  . Smokeless tobacco: Never Used  Substance Use Topics  . Alcohol use: Yes    Alcohol/week: 2.0 standard drinks    Types: 2 Cans of beer per week  . Drug use: No     Allergies   Latex, Augmentin [amoxicillin-pot clavulanate],  Doxycycline, Lactose intolerance (gi), Lialda [mesalamine], Prinivil [lisinopril], Triamcinolone acetonide, Amlodipine, and Lotensin [benazepril hcl]   Review of Systems Review of Systems  Constitutional: Negative for activity change, appetite change and fever.  HENT: Negative for congestion.   Eyes: Negative for visual disturbance.  Respiratory: Negative for cough, chest tightness and shortness of breath.   Gastrointestinal: Negative for abdominal pain, nausea and vomiting.  Genitourinary: Positive for difficulty urinating. Negative for dysuria, vaginal bleeding and vaginal discharge.  Musculoskeletal: Positive for back pain, myalgias and neck pain.  Skin: Positive for wound.  Neurological: Positive for weakness and headaches. Negative for dizziness and light-headedness.    all other systems are negative except as noted in the HPI and PMH.    Physical Exam Updated Vital Signs BP (!) 144/60   Pulse 68   Temp 97.6 F (36.4 C) (Oral)   Resp 18   LMP  (LMP Unknown)   SpO2 95%   Physical Exam Vitals signs and nursing note reviewed.  Constitutional:      General: She is not in acute distress.    Appearance: She is well-developed.  HENT:     Head: Normocephalic and atraumatic.     Comments: Periorbital ecchymosis and abrasion on the right Extraocular movements  intact    Mouth/Throat:     Pharynx: No oropharyngeal exudate.  Eyes:     Conjunctiva/sclera: Conjunctivae normal.     Pupils: Pupils are equal, round, and reactive to light.  Neck:     Musculoskeletal: Normal range of motion and neck supple.     Comments: Diffuse paraspinal C-spine tenderness without step-off or deformity Cardiovascular:     Rate and Rhythm: Normal rate and regular rhythm.     Heart sounds: Normal heart sounds. No murmur.  Pulmonary:     Effort: Pulmonary effort is normal. No respiratory distress.     Breath sounds: Normal breath sounds.  Abdominal:     Palpations: Abdomen is soft.      Tenderness: There is no abdominal tenderness.  There is no guarding or rebound.  Musculoskeletal: Normal range of motion.        General: Swelling, tenderness, deformity and signs of injury present.     Comments: Skin tears to bilateral elbows without bony tenderness.  Diffuse ecchymosis to bilateral forearms.  Multiple abrasions and skin tears to her lower extremities. Slight weakness of left leg compared to the right.  Full range of motion of bilateral hips, knees and ankles bilaterally.  No bony tenderness. Subjective paresthesias and decreased sensation to left leg.  Some weakness raising her left leg off the bed compared to the right.  Skin:    General: Skin is warm.     Capillary Refill: Capillary refill takes less than 2 seconds.  Neurological:     General: No focal deficit present.     Mental Status: She is alert and oriented to person, place, and time. Mental status is at baseline.     Cranial Nerves: No cranial nerve deficit.     Motor: No abnormal muscle tone.     Coordination: Coordination normal.     Comments: No ataxia on finger to nose bilaterally. No pronator drift. 5/5 strength throughout. CN 2-12 intact.Equal grip strength. Sensation intact.   Psychiatric:        Behavior: Behavior normal.      ED Treatments / Results  Labs (all labs ordered are listed, but only abnormal results are displayed) Labs Reviewed  CBC WITH DIFFERENTIAL/PLATELET - Abnormal; Notable for the following components:      Result Value   WBC 11.9 (*)    RBC 2.96 (*)    Hemoglobin 10.8 (*)    HCT 29.8 (*)    MCV 100.7 (*)    MCH 36.5 (*)    MCHC 36.2 (*)    Neutro Abs 9.9 (*)    Abs Immature Granulocytes 0.12 (*)    All other components within normal limits  BASIC METABOLIC PANEL - Abnormal; Notable for the following components:   Sodium 118 (*)    Potassium 3.3 (*)    Chloride 85 (*)    Calcium 8.4 (*)    GFR calc non Af Amer 56 (*)    All other components within normal limits   SARS CORONAVIRUS 2 (HOSPITAL ORDER, [place] [facility])    EKG EKG Interpretation  Date/Time:  Saturday March 12 2019 04:44:17 EDT Ventricular Rate:  78 PR Interval:    QRS Duration: 146 QT Interval:  474 QTC Calculation: 540 R Axis:   84 Text Interpretation:  Sinus rhythm Prolonged PR interval Left ventricular hypertrophy Prolonged QT interval No significant change was found Confirmed by Ezequiel Essex [phone]) on 03/12/2019 4:52:31 AM   Radiology Dg Elbow Complete Left  Result Date: 03/12/2019 CLINICAL DATA:  Initial evaluation for acute trauma, fall. EXAM: LEFT ELBOW - COMPLETE 3+ VIEW COMPARISON:  None. FINDINGS: There is no evidence of fracture, dislocation, or joint effusion. There is no evidence of arthropathy or other focal bone abnormality. Soft tissues are unremarkable. IMPRESSION: No acute osseous abnormality about the left elbow. Electronically Signed   By: Jeannine Boga M.D.   On: 03/12/2019 04:50   Dg Elbow Complete Right  Result Date: 03/12/2019 CLINICAL DATA:  Initial evaluation for acute trauma, fall. EXAM: RIGHT ELBOW - COMPLETE 3+ VIEW COMPARISON:  None. FINDINGS: No acute fracture dislocation. No joint effusion. Radial head intact. Mild degenerative spurring present about the elbow. No visible soft tissue injury. IMPRESSION: No acute osseous abnormality about the  right elbow. Electronically Signed   By: Jeannine Boga M.D.   On: 03/12/2019 04:52   Ct Head Wo Contrast  Result Date: 03/12/2019 CLINICAL DATA:  Pt BIB [facility] from home alone. Pt was changing her thermostat when she lost her balance and fell. Pt has hx of back surgery on May 29 EXAM: CT HEAD WITHOUT CONTRAST CT MAXILLOFACIAL WITHOUT CONTRAST CT CERVICAL SPINE WITHOUT CONTRAST TECHNIQUE: Multidetector CT imaging of the head, cervical spine, and maxillofacial structures were performed using the standard protocol without intravenous contrast. Multiplanar CT image reconstructions of  the cervical spine and maxillofacial structures were also generated. COMPARISON:  09/24/2017 FINDINGS: CT HEAD FINDINGS Brain: No evidence of acute infarction, hemorrhage, hydrocephalus, extra-axial collection or mass lesion/mass effect. Vascular: No hyperdense vessel or unexpected calcification. Skull: Normal. Negative for fracture or focal lesion. Other: None. CT MAXILLOFACIAL FINDINGS Osseous: No fracture or mandibular dislocation. No destructive process. Orbits: Negative. No traumatic or inflammatory finding. Sinuses: Clear. Soft tissues: Negative. CT CERVICAL SPINE FINDINGS Alignment: Normal. Skull base and vertebrae: No acute fracture. No primary bone lesion or focal pathologic process. Soft tissues and spinal canal: No prevertebral fluid or swelling. No visible canal hematoma. Disc levels: Mild loss of disc height at C4-C5 and C5-C6. Small endplate osteophytes at these levels. Minor disc bulging. No convincing disc herniation. There are facet degenerative changes most evident bilaterally at C5-C6. Upper chest: No acute findings. No masses or adenopathy. Mild centrilobular emphysema and scarring at the apices of the lungs. Other: None. IMPRESSION: HEAD CT 1. No intracranial abnormality. 2. No skull fracture. MAXILLOFACIAL CT 1. No fracture or acute finding. CERVICAL CT 1. No fracture or acute finding. Electronically Signed   By: Lajean Manes M.D.   On: 03/12/2019 04:39   Ct Cervical Spine Wo Contrast  Result Date: 03/12/2019 CLINICAL DATA:  Pt BIB [facility] from home alone. Pt was changing her thermostat when she lost her balance and fell. Pt has hx of back surgery on May 29 EXAM: CT HEAD WITHOUT CONTRAST CT MAXILLOFACIAL WITHOUT CONTRAST CT CERVICAL SPINE WITHOUT CONTRAST TECHNIQUE: Multidetector CT imaging of the head, cervical spine, and maxillofacial structures were performed using the standard protocol without intravenous contrast. Multiplanar CT image reconstructions of the cervical spine and  maxillofacial structures were also generated. COMPARISON:  09/24/2017 FINDINGS: CT HEAD FINDINGS Brain: No evidence of acute infarction, hemorrhage, hydrocephalus, extra-axial collection or mass lesion/mass effect. Vascular: No hyperdense vessel or unexpected calcification. Skull: Normal. Negative for fracture or focal lesion. Other: None. CT MAXILLOFACIAL FINDINGS Osseous: No fracture or mandibular dislocation. No destructive process. Orbits: Negative. No traumatic or inflammatory finding. Sinuses: Clear. Soft tissues: Negative. CT CERVICAL SPINE FINDINGS Alignment: Normal. Skull base and vertebrae: No acute fracture. No primary bone lesion or focal pathologic process. Soft tissues and spinal canal: No prevertebral fluid or swelling. No visible canal hematoma. Disc levels: Mild loss of disc height at C4-C5 and C5-C6. Small endplate osteophytes at these levels. Minor disc bulging. No convincing disc herniation. There are facet degenerative changes most evident bilaterally at C5-C6. Upper chest: No acute findings. No masses or adenopathy. Mild centrilobular emphysema and scarring at the apices of the lungs. Other: None. IMPRESSION: HEAD CT 1. No intracranial abnormality. 2. No skull fracture. MAXILLOFACIAL CT 1. No fracture or acute finding. CERVICAL CT 1. No fracture or acute finding. Electronically Signed   By: Lajean Manes M.D.   On: 03/12/2019 04:39   Ct Lumbar Spine Wo Contrast  Result Date: 03/12/2019 CLINICAL DATA:  Per  ed notes: Pt BIB [facility] from home alone. Pt was changing her thermostat when she lost her balance and fell. Pt has hx of back surgery on May 29 EXAM: CT LUMBAR SPINE WITHOUT CONTRAST TECHNIQUE: Multidetector CT imaging of the lumbar spine was performed without intravenous contrast administration. Multiplanar CT image reconstructions were also generated. COMPARISON:  CT, 02/15/2019 FINDINGS: Segmentation: 5 lumbar type vertebrae. Alignment: Normal. Vertebrae: Choose 1 Paraspinal and other  soft tissues: No masses or adenopathy. Chronic bilateral perinephric stranding similar to the prior CT. Dense aortic atherosclerotic calcifications. Disc levels: T12-L1 through L2-L3: No disc bulging or herniation. No stenosis. L3-L4: Mild spondylotic disc bulging. No disc herniation. No significant stenosis. L4-L5: Bilateral pedicle screws and interconnecting rods fuse L4-L5. The orthopedic hardware is well-seated and unchanged from the prior abdomen and pelvis CT. There is a disc spacer and bone graft material within the L4-L5 disc interspace and along the posterior elements. This all appears stable from the prior study. L5-S1: Moderate loss of disc height. Mild facet degenerative change. Spondylotic disc bulging, but no disc herniation. Mild right neural foraminal narrowing. IMPRESSION: 1. No fracture or acute finding. 2. Status post posterior lumbar spine fusion at L4-L5. No evidence of disruption of the orthopedic hardware. No change from the study dated 02/15/2019. Electronically Signed   By: Lajean Manes M.D.   On: 03/12/2019 04:44   Dg Pelvis Portable  Result Date: 03/12/2019 CLINICAL DATA:  Initial evaluation for acute trauma, fall. EXAM: PORTABLE PELVIS 1-2 VIEWS COMPARISON:  None. FINDINGS: Curvilinear lucency extends through the right pubis symphysis, favored to reflect summation of overlying soft tissue shadow, although an acute nondisplaced fracture not entirely excluded. No pubic diastasis. Remainder the bony pelvis intact. SI joints approximated. No abnormality about the hips. Prior fusion noted at L4-5. Degenerative changes noted within the lower lumbar spine. Mild osteoarthritic changes about the hips. No visible soft tissue injury. Scattered vascular calcifications noted. IMPRESSION: 1. Curvilinear lucency extending through the right pubis symphysis, favored to reflect summation of overlying soft tissue shadow, although an acute nondisplaced fracture not entirely excluded. Correlation with  physical exam recommended. 2. No other acute osseous abnormality about the pelvis. Electronically Signed   By: Jeannine Boga M.D.   On: 03/12/2019 04:54   Dg Chest Portable 1 View  Result Date: 03/12/2019 CLINICAL DATA:  Initial evaluation for acute trauma, fall. EXAM: PORTABLE CHEST 1 VIEW COMPARISON:  Prior radiograph from 09/12/2018 FINDINGS: Transverse heart size at the upper limits of normal. Mediastinal silhouette within normal limits. Aortic atherosclerosis. Lungs mildly hypoinflated. No focal infiltrates. No edema or effusion. No pneumothorax. No acute osseous abnormality. Diffuse osteopenia. IMPRESSION: 1. No active cardiopulmonary disease. 2. Aortic atherosclerosis. Electronically Signed   By: Jeannine Boga M.D.   On: 03/12/2019 04:48   Ct Maxillofacial Wo Contrast  Result Date: 03/12/2019 CLINICAL DATA:  Pt BIB [facility] from home alone. Pt was changing her thermostat when she lost her balance and fell. Pt has hx of back surgery on May 29 EXAM: CT HEAD WITHOUT CONTRAST CT MAXILLOFACIAL WITHOUT CONTRAST CT CERVICAL SPINE WITHOUT CONTRAST TECHNIQUE: Multidetector CT imaging of the head, cervical spine, and maxillofacial structures were performed using the standard protocol without intravenous contrast. Multiplanar CT image reconstructions of the cervical spine and maxillofacial structures were also generated. COMPARISON:  09/24/2017 FINDINGS: CT HEAD FINDINGS Brain: No evidence of acute infarction, hemorrhage, hydrocephalus, extra-axial collection or mass lesion/mass effect. Vascular: No hyperdense vessel or unexpected calcification. Skull: Normal. Negative for fracture or focal lesion.  Other: None. CT MAXILLOFACIAL FINDINGS Osseous: No fracture or mandibular dislocation. No destructive process. Orbits: Negative. No traumatic or inflammatory finding. Sinuses: Clear. Soft tissues: Negative. CT CERVICAL SPINE FINDINGS Alignment: Normal. Skull base and vertebrae: No acute fracture. No primary  bone lesion or focal pathologic process. Soft tissues and spinal canal: No prevertebral fluid or swelling. No visible canal hematoma. Disc levels: Mild loss of disc height at C4-C5 and C5-C6. Small endplate osteophytes at these levels. Minor disc bulging. No convincing disc herniation. There are facet degenerative changes most evident bilaterally at C5-C6. Upper chest: No acute findings. No masses or adenopathy. Mild centrilobular emphysema and scarring at the apices of the lungs. Other: None. IMPRESSION: HEAD CT 1. No intracranial abnormality. 2. No skull fracture. MAXILLOFACIAL CT 1. No fracture or acute finding. CERVICAL CT 1. No fracture or acute finding. Electronically Signed   By: Lajean Manes M.D.   On: 03/12/2019 04:39    Procedures Procedures (including critical care time)  Medications Ordered in ED Medications - No data to display   Initial Impression / Assessment and Plan / ED Course  I have reviewed the triage vital signs and the nursing notes.  Pertinent labs & imaging results that were available during my care of the patient were reviewed by me and considered in my medical decision making (see chart for details).       Patient with mechanical fall with walker and Foley catheter in place.  Recent back surgery on May 29 for cord compression.  She injured her head, face, bilateral knees and elbows.  Complains of worsening pain and weakness in her left leg since the fall.  CT head, C-spine and face are negative. CT lumbar spine as there is no hardware failure. Questionable pubic rami fracture on Xray.  Elbow Xrays negative.   Patient unclear whether she is having new weakness and numbness in her leg or just pain.  Her lab work is reassuring but shows worsening hyponatremia of 118.  Her baseline appears to be 120s. She was taken off thiazide diuretics during her last admission.  Patient will need admission given her recurrent hyponatremia as well as possible placement given  her current visits for falls.  Patient states she is having pain in her leg but she does not have any new weakness or numbness. Discussed with neurosurgery Vinnie PA-C.  He will see patient in consult and does not want MRI until he sees the patient.  Admission d/w Dr. Hal Hope.  Final Clinical Impressions(s) / ED Diagnoses   Final diagnoses:  None    ED Discharge Orders    None       Amedee Cerrone, Annie Main, MD 03/12/19 [phone]

## 2019-03-12 NOTE — ED Notes (Signed)
Patient transported to CT 

## 2019-03-12 NOTE — Progress Notes (Signed)
Regina Ortiz [id number] Admission Data: 03/12/2019 12:02 PM Attending Provider: Norval Morton, MD  DHW:[id number], Regina Cookey, MD Consults/ Treatment Team: Treatment Team:  Jovita Gamma, MD  Regina Ortiz is a 73 y.o. female patient admitted from ED awake, alert  & orientated  X 3,  Full Code, VSS - Blood pressure (!) 174/78, pulse 80, temperature 97.8 F (36.6 C), temperature source Oral, resp. rate 14, SpO2 100 %. no c/o shortness of breath, no c/o chest pain, no distress noted. Tele # 19 placed and pt is currently running:normal sinus rhythm.   IV site WDL:  upper arm right, condition patent and no redness with a transparent dsg that's clean dry and intact.  Allergies:   Allergies  Allergen Reactions  . Latex Swelling and Other (See Comments)     fever blisters  . Augmentin [Amoxicillin-Pot Clavulanate] Nausea And Vomiting  . Doxycycline Other (See Comments)    Blurred vision, nervous, unsteady  . Lactose Intolerance (Gi) Other (See Comments)    Unknown, per pt  . Lialda [Mesalamine] Diarrhea  . Prinivil [Lisinopril] Diarrhea  . Triamcinolone Acetonide Other (See Comments)    REDNESS AND PAIN  . Amlodipine Rash  . Lotensin [Benazepril Hcl] Rash     Past Medical History:  Diagnosis Date  . AIN grade I   . Anxiety   . Bruises easily   . Chronic diarrhea   . Chronic hyponatremia   . Colitis   . COPD (chronic obstructive pulmonary disease) ([place])   . Depression   . GERD (gastroesophageal reflux disease)   . History of chronic bronchitis   . History of vulvar dysplasia    serveral recurrency's   . Hypertension   . LBBB (left bundle branch block) W/ PROLONGED PR   CARDIOLOGSIT-  DR Johnsie Cancel-   LOV IN EPIC  . Macrocytosis   . Nocturia   . Osteoporosis   . Seronegative rheumatoid arthritis ([place])   . Short of breath on exertion   . Smokers' cough ([place])   . Venous insufficiency of leg    , Edema  . VIN III (vulvar intraepithelial neoplasia III)    RECURRENT -    History:   obtained from Chart and ED Nurse. Tobacco/alcohol: denied none  Pt orientation to unit, room and routine. Information packet given to patient/family and safety video watched.  Admission INP armband ID verified with patient/family, and in place. SR up x 2, fall risk assessment complete with Patient and family verbalizing understanding of risks associated with falls. Placed on low bed.  Pt verbalizes an understanding of how to use the call bell and to call for help before getting out of bed.  Skin covered with bruising and skin tears from fall. Boney prominences of bilateral hips are red with blanching however foams were applied to both for protection. Coccyx has stage 2 measuring approximately 0.8cm x 0.8 cm. Sacral foam dressing applied. Skin tear on lateral left knee cleaned and covered with telfa and wrapped with kerlex. Bruising to right forehead. Foley in upon admission. Pt states that foley was placed after her back surgery on 5/29 and she was told that it would stay in until the end of July. Patient bathed and foley care performed on admission. Patient lives at home alone.     Will cont to monitor and assist as needed.  Cathlean Marseilles Tamala Julian, MSN, RN, [place], [place] 03/12/2019 12:02 PM

## 2019-03-13 ENCOUNTER — Observation Stay (HOSPITAL_COMMUNITY): Payer: PPO

## 2019-03-13 DIAGNOSIS — R102 Pelvic and perineal pain: Secondary | ICD-10-CM | POA: Diagnosis not present

## 2019-03-13 LAB — CBC
HCT: 26.8 % — ABNORMAL LOW (ref 36.0–46.0)
Hemoglobin: 9.8 g/dL — ABNORMAL LOW (ref 12.0–15.0)
MCH: 37.1 pg — ABNORMAL HIGH (ref 26.0–34.0)
MCHC: 36.6 g/dL — ABNORMAL HIGH (ref 30.0–36.0)
MCV: 101.5 fL — ABNORMAL HIGH (ref 80.0–100.0)
Platelets: 207 10*3/uL (ref 150–400)
RBC: 2.64 MIL/uL — ABNORMAL LOW (ref 3.87–5.11)
RDW: 15.6 % — ABNORMAL HIGH (ref 11.5–15.5)
WBC: 8.1 10*3/uL (ref 4.0–10.5)
nRBC: 0 % (ref 0.0–0.2)

## 2019-03-13 LAB — BASIC METABOLIC PANEL
Anion gap: 10 (ref 5–15)
Anion gap: 11 (ref 5–15)
Anion gap: 8 (ref 5–15)
BUN: 14 mg/dL (ref 8–23)
BUN: 14 mg/dL (ref 8–23)
BUN: 17 mg/dL (ref 8–23)
CO2: 20 mmol/L — ABNORMAL LOW (ref 22–32)
CO2: 18 mmol/L — ABNORMAL LOW (ref 22–32)
CO2: 20 mmol/L — ABNORMAL LOW (ref 22–32)
Calcium: 8.1 mg/dL — ABNORMAL LOW (ref 8.9–10.3)
Calcium: 8.1 mg/dL — ABNORMAL LOW (ref 8.9–10.3)
Calcium: 8.1 mg/dL — ABNORMAL LOW (ref 8.9–10.3)
Chloride: 88 mmol/L — ABNORMAL LOW (ref 98–111)
Chloride: 89 mmol/L — ABNORMAL LOW (ref 98–111)
Chloride: 90 mmol/L — ABNORMAL LOW (ref 98–111)
Creatinine, Ser: 1.03 mg/dL — ABNORMAL HIGH (ref 0.44–1.00)
Creatinine, Ser: 1.13 mg/dL — ABNORMAL HIGH (ref 0.44–1.00)
Creatinine, Ser: 1.25 mg/dL — ABNORMAL HIGH (ref 0.44–1.00)
GFR calc Af Amer: >60 mL/min (ref >60)
GFR calc Af Amer: 55 mL/min — ABNORMAL LOW (ref >60)
GFR calc Af Amer: 49 mL/min — ABNORMAL LOW (ref >60)
GFR calc non Af Amer: 54 mL/min — ABNORMAL LOW (ref >60)
GFR calc non Af Amer: 48 mL/min — ABNORMAL LOW (ref >60)
GFR calc non Af Amer: 42 mL/min — ABNORMAL LOW (ref >60)
Glucose, Bld: 87 mg/dL (ref 70–99)
Glucose, Bld: 143 mg/dL — ABNORMAL HIGH (ref 70–99)
Glucose, Bld: 259 mg/dL — ABNORMAL HIGH (ref 70–99)
Potassium: 3.9 mmol/L (ref 3.5–5.1)
Potassium: 4.2 mmol/L (ref 3.5–5.1)
Potassium: 4.4 mmol/L (ref 3.5–5.1)
Sodium: 118 mmol/L — CL (ref 135–145)
Sodium: 118 mmol/L — CL (ref 135–145)
Sodium: 118 mmol/L — CL (ref 135–145)

## 2019-03-13 LAB — MAGNESIUM: Magnesium: 2.2 mg/dL (ref 1.7–2.4)

## 2019-03-13 MED ORDER — SODIUM CHLORIDE 0.9 % IV SOLN
Freq: Once | INTRAVENOUS | Status: AC
  Administered 2019-03-13: 06:00:00 via INTRAVENOUS

## 2019-03-13 MED ORDER — MORPHINE SULFATE (PF) 2 MG/ML IV SOLN
1.0000 mg | INTRAVENOUS | Status: DC | PRN
  Administered 2019-03-14 – 2019-03-15 (×3): 1 mg via INTRAVENOUS
  Filled 2019-03-13 (×5): qty 1

## 2019-03-13 MED ORDER — NAPROXEN 250 MG PO TABS
250.0000 mg | ORAL_TABLET | Freq: Two times a day (BID) | ORAL | Status: AC
  Administered 2019-03-13 – 2019-03-14 (×3): 250 mg via ORAL
  Filled 2019-03-13 (×3): qty 1

## 2019-03-13 MED ORDER — NAPROXEN 250 MG PO TABS: 250.0000 mg | ORAL_TABLET | Freq: Two times a day (BID) | ORAL | Status: DC

## 2019-03-13 NOTE — Progress Notes (Signed)
Contacted patient's son. Son wanted to know results of pelvic ultrasound and lumbar MRI. Results provided as noted in physician's report.   Son verbalized concerns that patient has been hospitalized five times for falls. Per son, patient has Med Alert device but does not recall where it was on last fall. Patient spends the late evening hours alone in home after nurse leaves per son's report.   Son does not want patient to go to [facility] for rehab services. Son stated he did not want patient to go to any rehab if possible due  to concerns with COVID-19. Son wants patient to have physical therapy services.   Son states he is the only family member available to support the patient and would like more in depth communication about the patient's treatment and discharge plans.

## 2019-03-13 NOTE — Care Management Obs Status (Signed)
[place] [place] NOTIFICATION   Patient Details  Name: Regina Ortiz MRN: [id number] Date of Birth: Oct 08, 1945   Medicare Observation Status Notification Given:  Yes    Carles Collet, RN 03/13/2019, 1:37 PM

## 2019-03-13 NOTE — Progress Notes (Signed)
PROGRESS NOTE    Regina Ortiz  IRJ:[id number] DOB: Apr 17, 1946 DOA: 03/12/2019 PCP: Maurice Small, MD  Brief Narrative: 73 year old female with history of lumbar spinal stenosis, cauda equina underwent L4-L5 decompression and fusion by Dr. Ellene Route on 5/29, subsequently discharged to rehab on 6/4 and and then home after a week was seen in the emergency room on 6/15 with urinary retention she was discharged home with a Foley catheter, with plans for urology follow-up -Readmitted 7/11 with worsening low back pain and left lower extremity pain following a fall which was mechanical.  -In the emergency room she was noted to have sodium of 118, creatinine of 0.9, CT scan of the head C-spine were negative for acute abnormalities x-ray of the pelvis could not rule out pubic ramus fracture  Assessment & Plan:     Hyponatremia -Acute on chronic -Has baseline hyponatremia with sodium in the 125 range,, now 118, last hospitalization Aldactone and losartan were discontinued, TSH was noted to be within normal limits, cortisol was slightly low however patient is on prednisone -Patient appears euvolemic, I suspect this is secondary to tea and toast diet, low solute diet only -Urine sodium and osmolarity are low -Discontinue IV fluids, recheck bmet now, 1200 cc fluid restriction  Fall/worsening low back pain -Patient complains more of low back pain been pelvic pain or discomfort -Pelvic x-ray yesterday could not rule out pubic ramus fracture will repeat pelvic x-rays today -PT OT evaluation  Low back pain/L4-L5 disc herniation status post surgical decompression and stabilization with pedicle screw fixation on 5/29 and by Dr. Ellene Route  -Foley catheter placed 6/15 following ED visit for retention , this was changed 3 days ago  -Neurosurgery following, Dr. Ellene Route to follow-up in a.m.  -MRI showed improvement in prior compression of thecal sac at L4-L5 with persistent lateral recess stenosis at L3-L4  -PT OT    -Trial of gabapentin  -add Low-dose NSAIDs today  -Morphine for severe pain  Leukocytosis:  -Likely reactive, resolved   Possible UTI versus colonization  -Did have mild leukocytosis on admission however afebrile, no concrete symptoms suggestive of infection at this time, started on IV ceftriaxone on admission yesterday will continue this today -Urine culture growing E. Coli, follow-up sensitivities  -Foley catheter placed 6/15 last changed on 7/9, due to follow-up with urology this month  Prolonged QT interval: Acute on chronic.  QTC noted to be 540 on admission. -Even mag yesterday, QTC is 502 today, recheck in a.m., hold QT prolonging meds -Potassium corrected  Constipation: - Patient reports last bowel movement 3 to 4 days ago.  Patient on pain medications with tramadol at home with MiraLAX. -Add MiraLAX, continue Senokot  Hypokalemia: -Replaced  Macrocytic anemia:  -Improving, monitor, it was 8 g/dL last checked on 6/17.  Patient with elevated MCV and MCH, but vitamin B12 was 1298 and folate 34 when checked just last month.   Essential hypertension: Stable -Continue metoprolol, diltiazem, and hydralazine as tolerated  Rheumatoid arthritis on immunosuppressive therapy: Patient with seronegative rheumatoid arthritis currently on on long term prednisone -Continue prednisone and Plaquenil  COPD, without acute exacerbation: Stable. -Albuterol breathing treatments as needed  Chronic diastolic congestive heart failure: Patient appears to be euvolemic at this time.  Last echo from 09/2017 revealed EF 55-60 % with grade 1 diastolic dysfunction. -Continue to monitor, not on diuretics  Hyperlipidemia -Continue atorvastatin  Anxiety -Continue BuSpar and Klonopin as needed for anxiety  Osteoporosis -Continue Fosamax at discharge  DVT prophylaxis: Lovenox Code Status: Full  Family Communication: No family present at bedside Disposition Plan: To be  determined    Consultants:   NSG   Procedures:   Antimicrobials:    Subjective: -Continues to have severe low back pain -Denies any dyspnea edema, no nausea or vomiting  Objective: Vitals:   03/12/19 2144 03/12/19 2144 03/13/19 0500 03/13/19 0604  BP: (!) 123/56 (!) 123/56 (!) 140/56 (!) 140/56  Pulse: 71 71 69 69  Resp: 11 11 12 12   Temp: 98.2 F (36.8 C) 98.2 F (36.8 C) 97.7 F (36.5 C) 97.7 F (36.5 C)  TempSrc: Axillary  Axillary   SpO2: 99% 99% 99% 99%    Intake/Output Summary (Last 24 hours) at 03/13/2019 1120 Last data filed at 03/13/2019 7654 Gross per 24 hour  Intake 773 ml  Output 900 ml  Net -127 ml   There were no vitals filed for this visit.  Examination:  General exam: Elderly frail chronically ill female, AAO x3 Respiratory system: Clear bilaterally, diminished at both bases Cardiovascular system: S1 & S2 heard, RRR. Gastrointestinal system: Abdomen is nondistended, soft and nontender.Normal bowel sounds heard. Central nervous system: Alert and oriented.  4+ strength in both lower extremities, sensations intact Extremities: No edema Skin: Extensive bruises both arms and legs  psychiatry: Judgement and insight appear normal. Mood & affect appropriate.     Data Reviewed:   CBC: Recent Labs  Lab 03/12/19 0403 03/13/19 0246  WBC 11.9* 8.1  NEUTROABS 9.9*  --   HGB 10.8* 9.8*  HCT 29.8* 26.8*  MCV 100.7* 101.5*  PLT 225 650   Basic Metabolic Panel: Recent Labs  Lab 03/12/19 0403 03/12/19 0955 03/13/19 0246  NA 118* 120* 118*  K 3.3* 3.3* 3.9  CL 85* 86* 88*  CO2 22 21* 20*  GLUCOSE 99 101* 87  BUN 15 12 14   CREATININE 0.99 0.85 1.03*  CALCIUM 8.4* 8.4* 8.1*  MG  --   --  2.2   GFR: CrCl cannot be calculated (Unknown ideal weight.). Liver Function Tests: No results for input(s): AST, ALT, ALKPHOS, BILITOT, PROT, ALBUMIN in the last 168 hours. No results for input(s): LIPASE, AMYLASE in the last 168 hours. No results for  input(s): AMMONIA in the last 168 hours. Coagulation Profile: No results for input(s): INR, PROTIME in the last 168 hours. Cardiac Enzymes: No results for input(s): CKTOTAL, CKMB, CKMBINDEX, TROPONINI in the last 168 hours. BNP (last 3 results) No results for input(s): PROBNP in the last 8760 hours. HbA1C: No results for input(s): HGBA1C in the last 72 hours. CBG: No results for input(s): GLUCAP in the last 168 hours. Lipid Profile: No results for input(s): CHOL, HDL, LDLCALC, TRIG, CHOLHDL, LDLDIRECT in the last 72 hours. Thyroid Function Tests: No results for input(s): TSH, T4TOTAL, FREET4, T3FREE, THYROIDAB in the last 72 hours. Anemia Panel: No results for input(s): VITAMINB12, FOLATE, FERRITIN, TIBC, IRON, RETICCTPCT in the last 72 hours. Urine analysis:    Component Value Date/Time   COLORURINE YELLOW 03/12/2019 1148   APPEARANCEUR CLEAR 03/12/2019 1148   LABSPEC 1.003 (L) 03/12/2019 1148   PHURINE 7.0 03/12/2019 1148   GLUCOSEU NEGATIVE 03/12/2019 1148   HGBUR NEGATIVE 03/12/2019 1148   BILIRUBINUR NEGATIVE 03/12/2019 1148   KETONESUR NEGATIVE 03/12/2019 1148   PROTEINUR 30 (A) 03/12/2019 1148   UROBILINOGEN 0.2 09/22/2012 1100   NITRITE POSITIVE (A) 03/12/2019 1148   LEUKOCYTESUR MODERATE (A) 03/12/2019 1148   Sepsis Labs: @LABRCNTIP (procalcitonin:4,lacticidven:4)  ) Recent Results (from the past 240 hour(s))  SARS Coronavirus  2 (CEPHEID - Performed in [facility]), Hosp Order     Status: None   Collection Time: 03/12/19  6:17 AM   Specimen: Nasopharyngeal Swab  Result Value Ref Range Status   SARS Coronavirus 2 NEGATIVE NEGATIVE Final    Comment: (NOTE) If result is NEGATIVE SARS-CoV-2 target nucleic acids are NOT DETECTED. The SARS-CoV-2 RNA is generally detectable in upper and lower  respiratory specimens during the acute phase of infection. The lowest  concentration of SARS-CoV-2 viral copies this assay can detect is 250  copies / mL. A  negative result does not preclude SARS-CoV-2 infection  and should not be used as the sole basis for treatment or other  patient management decisions.  A negative result may occur with  improper specimen collection / handling, submission of specimen other  than nasopharyngeal swab, presence of viral mutation(s) within the  areas targeted by this assay, and inadequate number of viral copies  (<250 copies / mL). A negative result must be combined with clinical  observations, patient history, and epidemiological information. If result is POSITIVE SARS-CoV-2 target nucleic acids are DETECTED. The SARS-CoV-2 RNA is generally detectable in upper and lower  respiratory specimens dur ing the acute phase of infection.  Positive  results are indicative of active infection with SARS-CoV-2.  Clinical  correlation with patient history and other diagnostic information is  necessary to determine patient infection status.  Positive results do  not rule out bacterial infection or co-infection with other viruses. If result is PRESUMPTIVE POSTIVE SARS-CoV-2 nucleic acids MAY BE PRESENT.   A presumptive positive result was obtained on the submitted specimen  and confirmed on repeat testing.  While 2019 novel coronavirus  (SARS-CoV-2) nucleic acids may be present in the submitted sample  additional confirmatory testing may be necessary for epidemiological  and / or clinical management purposes  to differentiate between  SARS-CoV-2 and other Sarbecovirus currently known to infect humans.  If clinically indicated additional testing with an alternate test  methodology [phone]) is advised. The SARS-CoV-2 RNA is generally  detectable in upper and lower respiratory sp ecimens during the acute  phase of infection. The expected result is Negative. Fact Sheet for Patients:  StrictlyIdeas.no Fact Sheet for Healthcare Providers: BankingDealers.co.za This test is not  yet approved or cleared by the [place] FDA and has been authorized for detection and/or diagnosis of SARS-CoV-2 by FDA under an Emergency Use Authorization (EUA).  This EUA will remain in effect (meaning this test can be used) for the duration of the COVID-19 declaration under Section 564(b)(1) of the Act, 21 U.S.C. section 360bbb-3(b)(1), unless the authorization is terminated or revoked sooner. Performed at [facility], [place] [address], [place], [place] [address]   Culture, Urine     Status: Abnormal (Preliminary result)   Collection Time: 03/12/19 11:42 AM   Specimen: Urine, Catheterized  Result Value Ref Range Status   Specimen Description URINE, CATHETERIZED  Final   Special Requests   Final    NONE Performed at [facility], [place] [address], [place], [place] [address]    Culture >=100,000 COLONIES/mL ESCHERICHIA COLI (A)  Final   Report Status PENDING  Incomplete         Radiology Studies: Dg Elbow Complete Left  Result Date: 03/12/2019 CLINICAL DATA:  Initial evaluation for acute trauma, fall. EXAM: LEFT ELBOW - COMPLETE 3+ VIEW COMPARISON:  None. FINDINGS: There is no evidence of fracture, dislocation, or joint effusion. There is no  evidence of arthropathy or other focal bone abnormality. Soft tissues are unremarkable. IMPRESSION: No acute osseous abnormality about the left elbow. Electronically Signed   By: Jeannine Boga M.D.   On: 03/12/2019 04:50   Dg Elbow Complete Right  Result Date: 03/12/2019 CLINICAL DATA:  Initial evaluation for acute trauma, fall. EXAM: RIGHT ELBOW - COMPLETE 3+ VIEW COMPARISON:  None. FINDINGS: No acute fracture dislocation. No joint effusion. Radial head intact. Mild degenerative spurring present about the elbow. No visible soft tissue injury. IMPRESSION: No acute osseous abnormality about the right elbow. Electronically Signed   By: Jeannine Boga M.D.   On: 03/12/2019 04:52   Ct Head Wo Contrast  Result Date:  03/12/2019 CLINICAL DATA:  Pt BIB [facility] from home alone. Pt was changing her thermostat when she lost her balance and fell. Pt has hx of back surgery on May 29 EXAM: CT HEAD WITHOUT CONTRAST CT MAXILLOFACIAL WITHOUT CONTRAST CT CERVICAL SPINE WITHOUT CONTRAST TECHNIQUE: Multidetector CT imaging of the head, cervical spine, and maxillofacial structures were performed using the standard protocol without intravenous contrast. Multiplanar CT image reconstructions of the cervical spine and maxillofacial structures were also generated. COMPARISON:  09/24/2017 FINDINGS: CT HEAD FINDINGS Brain: No evidence of acute infarction, hemorrhage, hydrocephalus, extra-axial collection or mass lesion/mass effect. Vascular: No hyperdense vessel or unexpected calcification. Skull: Normal. Negative for fracture or focal lesion. Other: None. CT MAXILLOFACIAL FINDINGS Osseous: No fracture or mandibular dislocation. No destructive process. Orbits: Negative. No traumatic or inflammatory finding. Sinuses: Clear. Soft tissues: Negative. CT CERVICAL SPINE FINDINGS Alignment: Normal. Skull base and vertebrae: No acute fracture. No primary bone lesion or focal pathologic process. Soft tissues and spinal canal: No prevertebral fluid or swelling. No visible canal hematoma. Disc levels: Mild loss of disc height at C4-C5 and C5-C6. Small endplate osteophytes at these levels. Minor disc bulging. No convincing disc herniation. There are facet degenerative changes most evident bilaterally at C5-C6. Upper chest: No acute findings. No masses or adenopathy. Mild centrilobular emphysema and scarring at the apices of the lungs. Other: None. IMPRESSION: HEAD CT 1. No intracranial abnormality. 2. No skull fracture. MAXILLOFACIAL CT 1. No fracture or acute finding. CERVICAL CT 1. No fracture or acute finding. Electronically Signed   By: Lajean Manes M.D.   On: 03/12/2019 04:39   Ct Cervical Spine Wo Contrast  Result Date: 03/12/2019 CLINICAL DATA:  Pt BIB  [facility] from home alone. Pt was changing her thermostat when she lost her balance and fell. Pt has hx of back surgery on May 29 EXAM: CT HEAD WITHOUT CONTRAST CT MAXILLOFACIAL WITHOUT CONTRAST CT CERVICAL SPINE WITHOUT CONTRAST TECHNIQUE: Multidetector CT imaging of the head, cervical spine, and maxillofacial structures were performed using the standard protocol without intravenous contrast. Multiplanar CT image reconstructions of the cervical spine and maxillofacial structures were also generated. COMPARISON:  09/24/2017 FINDINGS: CT HEAD FINDINGS Brain: No evidence of acute infarction, hemorrhage, hydrocephalus, extra-axial collection or mass lesion/mass effect. Vascular: No hyperdense vessel or unexpected calcification. Skull: Normal. Negative for fracture or focal lesion. Other: None. CT MAXILLOFACIAL FINDINGS Osseous: No fracture or mandibular dislocation. No destructive process. Orbits: Negative. No traumatic or inflammatory finding. Sinuses: Clear. Soft tissues: Negative. CT CERVICAL SPINE FINDINGS Alignment: Normal. Skull base and vertebrae: No acute fracture. No primary bone lesion or focal pathologic process. Soft tissues and spinal canal: No prevertebral fluid or swelling. No visible canal hematoma. Disc levels: Mild loss of disc height at C4-C5 and C5-C6. Small endplate osteophytes at these levels. Minor  disc bulging. No convincing disc herniation. There are facet degenerative changes most evident bilaterally at C5-C6. Upper chest: No acute findings. No masses or adenopathy. Mild centrilobular emphysema and scarring at the apices of the lungs. Other: None. IMPRESSION: HEAD CT 1. No intracranial abnormality. 2. No skull fracture. MAXILLOFACIAL CT 1. No fracture or acute finding. CERVICAL CT 1. No fracture or acute finding. Electronically Signed   By: Lajean Manes M.D.   On: 03/12/2019 04:39   Ct Lumbar Spine Wo Contrast  Result Date: 03/12/2019 CLINICAL DATA:  Per ed notes: Pt BIB [facility] from home  alone. Pt was changing her thermostat when she lost her balance and fell. Pt has hx of back surgery on May 29 EXAM: CT LUMBAR SPINE WITHOUT CONTRAST TECHNIQUE: Multidetector CT imaging of the lumbar spine was performed without intravenous contrast administration. Multiplanar CT image reconstructions were also generated. COMPARISON:  CT, 02/15/2019 FINDINGS: Segmentation: 5 lumbar type vertebrae. Alignment: Normal. Vertebrae: Choose 1 Paraspinal and other soft tissues: No masses or adenopathy. Chronic bilateral perinephric stranding similar to the prior CT. Dense aortic atherosclerotic calcifications. Disc levels: T12-L1 through L2-L3: No disc bulging or herniation. No stenosis. L3-L4: Mild spondylotic disc bulging. No disc herniation. No significant stenosis. L4-L5: Bilateral pedicle screws and interconnecting rods fuse L4-L5. The orthopedic hardware is well-seated and unchanged from the prior abdomen and pelvis CT. There is a disc spacer and bone graft material within the L4-L5 disc interspace and along the posterior elements. This all appears stable from the prior study. L5-S1: Moderate loss of disc height. Mild facet degenerative change. Spondylotic disc bulging, but no disc herniation. Mild right neural foraminal narrowing. IMPRESSION: 1. No fracture or acute finding. 2. Status post posterior lumbar spine fusion at L4-L5. No evidence of disruption of the orthopedic hardware. No change from the study dated 02/15/2019. Electronically Signed   By: Lajean Manes M.D.   On: 03/12/2019 04:44   Mr Lumbar Spine W Wo Contrast  Result Date: 03/12/2019 CLINICAL DATA:  Patient status post L4-5 fusion 01/28/2019. Low back and left leg pain since the procedure. EXAM: MRI LUMBAR SPINE WITHOUT AND WITH CONTRAST TECHNIQUE: Multiplanar and multiecho pulse sequences of the lumbar spine were obtained without and with intravenous contrast. CONTRAST:  5 cc Gadavist IV. COMPARISON:  MRI lumbar spine 02/15/2019. CT lumbar spine  this same day. FINDINGS: Segmentation:  Standard. Alignment:  Trace anterolisthesis L4 on L5.  Otherwise maintained. Vertebrae: No fracture, evidence of discitis, or bone lesion. Status post L4-5 fusion as described below. Conus medullaris and cauda equina: Conus extends to the L1 level. Conus and cauda equina appear normal. Paraspinal and other soft tissues: Left renal cyst noted. Disc levels: T10-11 and T11-12 are imaged in the sagittal plane only and negative. T12-L1: Negative. L1-2: Mild facet degenerative change.  Otherwise negative. L2-3: Minimal disc bulge and mild-to-moderate facet arthropathy. No stenosis. L3-4: There is a disc bulge eccentric to the left and mild to moderate facet arthropathy. Dorsal epidural fat is prominent. Moderate compression of the thecal sac and left worse than right subarticular recess narrowing appear unchanged. Neural foramina are open. L4-5: Status post laminectomy and discectomy and fusion. Based on comparison with prior CT scan, acute or protruding into the disc interspace compatible with interbody graft and likely disc. Compression of the thecal sac seen on the prior MRI has markedly improved likely due to resolving postoperative edema. The central canal is now open. Mild to moderate bilateral foraminal narrowing appears greater on the left and unchanged.  L5-S1: Bilateral facet degenerative change and a shallow disc bulge. The central canal is open. Marked right foraminal narrowing is unchanged. Left foramen is minimally narrowed. IMPRESSION: Status post L4-5 fusion. Compression of the thecal sac seen on the prior MRI has markedly improved since the prior examination likely due to resolving postoperative edema. Mild to moderate foraminal narrowing at this level appears greater on the left and not notably changed. No change in marked right foraminal narrowing at L5-S1. No change in moderate compression of the thecal sac at L3-4 due to dorsal epidural fat and disc bulge to the  left. Left worse than right subarticular recess narrowing at this level is also unchanged. Electronically Signed   By: Inge Rise M.D.   On: 03/12/2019 14:32   Dg Pelvis Portable  Result Date: 03/12/2019 CLINICAL DATA:  Initial evaluation for acute trauma, fall. EXAM: PORTABLE PELVIS 1-2 VIEWS COMPARISON:  None. FINDINGS: Curvilinear lucency extends through the right pubis symphysis, favored to reflect summation of overlying soft tissue shadow, although an acute nondisplaced fracture not entirely excluded. No pubic diastasis. Remainder the bony pelvis intact. SI joints approximated. No abnormality about the hips. Prior fusion noted at L4-5. Degenerative changes noted within the lower lumbar spine. Mild osteoarthritic changes about the hips. No visible soft tissue injury. Scattered vascular calcifications noted. IMPRESSION: 1. Curvilinear lucency extending through the right pubis symphysis, favored to reflect summation of overlying soft tissue shadow, although an acute nondisplaced fracture not entirely excluded. Correlation with physical exam recommended. 2. No other acute osseous abnormality about the pelvis. Electronically Signed   By: Jeannine Boga M.D.   On: 03/12/2019 04:54   Dg Chest Portable 1 View  Result Date: 03/12/2019 CLINICAL DATA:  Initial evaluation for acute trauma, fall. EXAM: PORTABLE CHEST 1 VIEW COMPARISON:  Prior radiograph from 09/12/2018 FINDINGS: Transverse heart size at the upper limits of normal. Mediastinal silhouette within normal limits. Aortic atherosclerosis. Lungs mildly hypoinflated. No focal infiltrates. No edema or effusion. No pneumothorax. No acute osseous abnormality. Diffuse osteopenia. IMPRESSION: 1. No active cardiopulmonary disease. 2. Aortic atherosclerosis. Electronically Signed   By: Jeannine Boga M.D.   On: 03/12/2019 04:48   Ct Maxillofacial Wo Contrast  Result Date: 03/12/2019 CLINICAL DATA:  Pt BIB [facility] from home alone. Pt was changing  her thermostat when she lost her balance and fell. Pt has hx of back surgery on May 29 EXAM: CT HEAD WITHOUT CONTRAST CT MAXILLOFACIAL WITHOUT CONTRAST CT CERVICAL SPINE WITHOUT CONTRAST TECHNIQUE: Multidetector CT imaging of the head, cervical spine, and maxillofacial structures were performed using the standard protocol without intravenous contrast. Multiplanar CT image reconstructions of the cervical spine and maxillofacial structures were also generated. COMPARISON:  09/24/2017 FINDINGS: CT HEAD FINDINGS Brain: No evidence of acute infarction, hemorrhage, hydrocephalus, extra-axial collection or mass lesion/mass effect. Vascular: No hyperdense vessel or unexpected calcification. Skull: Normal. Negative for fracture or focal lesion. Other: None. CT MAXILLOFACIAL FINDINGS Osseous: No fracture or mandibular dislocation. No destructive process. Orbits: Negative. No traumatic or inflammatory finding. Sinuses: Clear. Soft tissues: Negative. CT CERVICAL SPINE FINDINGS Alignment: Normal. Skull base and vertebrae: No acute fracture. No primary bone lesion or focal pathologic process. Soft tissues and spinal canal: No prevertebral fluid or swelling. No visible canal hematoma. Disc levels: Mild loss of disc height at C4-C5 and C5-C6. Small endplate osteophytes at these levels. Minor disc bulging. No convincing disc herniation. There are facet degenerative changes most evident bilaterally at C5-C6. Upper chest: No acute findings. No masses or adenopathy.  Mild centrilobular emphysema and scarring at the apices of the lungs. Other: None. IMPRESSION: HEAD CT 1. No intracranial abnormality. 2. No skull fracture. MAXILLOFACIAL CT 1. No fracture or acute finding. CERVICAL CT 1. No fracture or acute finding. Electronically Signed   By: Lajean Manes M.D.   On: 03/12/2019 04:39        Scheduled Meds:  atorvastatin  40 mg Oral Daily   busPIRone  30 mg Oral BID   carvedilol  6.25 mg Oral BID WC   diltiazem  360 mg Oral  Daily   enoxaparin (LOVENOX) injection  40 mg Subcutaneous Q24H   gabapentin  300 mg Oral BID   hydrALAZINE  75 mg Oral TID   hydroxychloroquine  200 mg Oral Daily   naproxen  250 mg Oral BID WC   predniSONE  5 mg Oral Q breakfast   senna-docusate  1 tablet Oral BID   sodium chloride flush  3 mL Intravenous Q12H   Continuous Infusions:   LOS: 0 days    Time spent:26min    Domenic Polite, MD [facility] Page via [email or web address], password [email or web address] After 7PM please contact night-coverage  03/13/2019, 11:20 AM

## 2019-03-13 NOTE — Progress Notes (Signed)
Occupational Therapy Evaluation Patient Details Name: Regina Ortiz MRN: [id number] DOB: 1945-10-24 Today's Date: 03/13/2019    History of Present Illness 73 y.o. female with medical history significant of herniated disks L4/L5 with cauda equina syndrome s/p surgical decompression on 01/28/2019 by Dr. Ellene Route, hypertension, chronic hyponatremia, rheumatoid arthritis, COPD, macrocytic anemia, and GERD; who presents after having a fall at home. Elevated BP in ED 162/76, as well as persistent LBP and LLE pain. admitted for observation 03/12/2019. Imaging did not reveal any acute fx.   Clinical Impression   Pt admitted s/p fall. PTA, she lives at home alone but has an aide who assists her 9 am - 4 pm 7 days a week. Aide has been assisting with iADLs and BADLs. Pt is currently very limited by pain in her back and was unable to tolerate sitting in recliner during today's session (returned to bed at end of session).  Min assist with RW for stand pivot transfers. Min-max assist for UB/LB ADLs. Pt would benefit from rehab at SNF before return home. However, pt is refusing SNF, stating she was at one recently for rehab and hated it.  If pt continues to decline SNF, she will need HHOT for discharge home. Will continue to follow acutely in order to maximize safety and independence with ADLs.    Follow Up Recommendations  SNF;Supervision/Assistance - 24 hour;Home health OT    Equipment Recommendations  None recommended by OT    Recommendations for Other Services       Precautions / Restrictions Precautions Precautions: Fall;Back      Mobility Bed Mobility Overal bed mobility: Needs Assistance Bed Mobility: Sidelying to Sit;Sit to Sidelying   Sidelying to sit: Min assist     Sit to sidelying: Min assist General bed mobility comments: Min assist to support trunk and LEs.  Transfers Overall transfer level: Needs assistance Equipment used: Rolling walker (2 wheeled) Transfers: Sit to/from  [facility] Sit to Stand: Min assist Stand pivot transfers: Min assist       General transfer comment: Assist to turn walker. Max vcs for hand placement.    Balance                                           ADL either performed or assessed with clinical judgement   ADL Overall ADL's : Needs assistance/impaired Eating/Feeding: Sitting;Supervision/ safety   Grooming: Supervision/safety;Sitting   Upper Body Bathing: Minimal assistance;Sitting   Lower Body Bathing: Sit to/from stand;Moderate assistance   Upper Body Dressing : Minimal assistance;Sitting   Lower Body Dressing: Maximal assistance;Sit to/from stand   Toilet Transfer: Minimal assistance;Stand-pivot;RW             General ADL Comments: Pt transferred from bed to recliner.  She sat in recliner ~7 minutes but was unable to get comfortable despite therapist attempts at various positioning techniques. C/o pain in back, especially lower back. Pt returned to bed and therapist assisted her with positioning on her left side with use of pillows and blankets.     Vision         Perception     Praxis      Pertinent Vitals/Pain Pain Assessment: 0-10 Pain Score: 10-Worst pain ever Pain Location: back  Pain Descriptors / Indicators: Discomfort;Grimacing;Guarding Pain Intervention(s): Limited activity within patient's tolerance;Repositioned     Hand Dominance     Extremity/Trunk Assessment Upper  Extremity Assessment Upper Extremity Assessment: Generalized weakness   Lower Extremity Assessment Lower Extremity Assessment: Defer to PT evaluation       Communication Communication Communication: No difficulties   Cognition Arousal/Alertness: Awake/alert Behavior During Therapy: WFL for tasks assessed/performed Overall Cognitive Status: Impaired/Different from baseline Area of Impairment: Problem solving                             Problem Solving: Slow  processing     General Comments  Bruising around right eye and bilateral UEs.    Exercises     Shoulder Instructions      Home Living Family/patient expects to be discharged to:: Private residence Living Arrangements: Alone Available Help at Discharge: Personal care attendant(9-4/7 days a week) Type of Home: House Home Access: Stairs to enter [employer] of Steps: 2 Entrance Stairs-Rails: None Home Layout: Two level;Able to live on main level with bedroom/bathroom Alternate Level Stairs-Number of Steps: flight Alternate Level Stairs-Rails: Right Bathroom Shower/Tub: Tub/shower unit   Bathroom Toilet: Standard     Home Equipment: Environmental consultant - 2 wheels;Bedside commode          Prior Functioning/Environment Level of Independence: Needs assistance  Gait / Transfers Assistance Needed: ambulates with RW limited household levels  ADL's / Homemaking Assistance Needed: HHA assists with bathing and dressing and performs iADLs. Takes sponge baths.            OT Problem List: Decreased strength;Decreased activity tolerance;Impaired balance (sitting and/or standing);Decreased knowledge of use of DME or AE;Pain      OT Treatment/Interventions: Self-care/ADL training;DME and/or AE instruction;Patient/family education;Balance training;Therapeutic activities;Energy conservation    OT Goals(Current goals can be found in the care plan section) Acute Rehab OT Goals Patient Stated Goal: to go home OT Goal Formulation: With patient Time For Goal Achievement: 03/27/19 Potential to Achieve Goals: Good  OT Frequency: Min 2X/week   Barriers to D/C: Decreased caregiver support  does not have 24/7 assist       Co-evaluation              AM-PAC OT "6 Clicks" Daily Activity     Outcome Measure Help from another person eating meals?: None Help from another person taking care of personal grooming?: None(seated) Help from another person toileting, which includes using  toliet, bedpan, or urinal?: A Little Help from another person bathing (including washing, rinsing, drying)?: A Lot Help from another person to put on and taking off regular upper body clothing?: A Little Help from another person to put on and taking off regular lower body clothing?: A Lot 6 Click Score: 18   End of Session Equipment Utilized During Treatment: Gait belt;Rolling walker Nurse Communication: Mobility status  Activity Tolerance: Patient tolerated treatment well Patient left: in bed;with call bell/phone within reach  OT Visit Diagnosis: Unsteadiness on feet (R26.81);Muscle weakness (generalized) (M62.81);History of falling (Z91.81);Pain Pain - part of body: (back)                Time: 5784-6962 OT Time Calculation (min): 47 min Charges:  OT General Charges $OT Visit: 1 Visit OT Evaluation $OT Eval Moderate Complexity: 1 Mod OT Treatments $Self Care/Home Management : 8-22 mins $Therapeutic Activity: 8-22 mins   Darrol Jump  OTR/L [place] [phone] 03/13/2019, 1:02 PM

## 2019-03-13 NOTE — Progress Notes (Signed)
CRITICAL VALUE ALERT  Critical Value:  Serum sodium 118  Date & Time Notied:  03/13/19 1140  Provider Notified: Verdell Carmine  Orders Received/Actions taken: awaiting orders

## 2019-03-13 NOTE — TOC Transition Note (Addendum)
Transition of Care [facility]) - CM/SW Discharge Note   Patient Details  Name: Regina Ortiz MRN: [id number] Date of Birth: December 11, 1945  [facility] CM/SW Contact:  Carles Collet, RN Phone Number: 03/13/2019, 1:33 PM   Clinical Narrative:    Spoke to patient at bedside. She declines SNF placement. She states that she is from home, she has an aid that comes to her house 9-5 everyday and is alone at night. Discussed interventions that would help her from falling at night when she gets up to the bathroom. She stats that she has a walker. Discussed a 3/1 she states she has tried that and it does not work, she can't pee on it.  She states she is active w [name] for [facility] PT OT. Notified [name] that she will DC in next 1-2 days.  No other CM needs identified at this time.     Final next level of care: [place] Barriers to Discharge: No Barriers Identified   Patient Goals and CMS Choice Patient states their goals for this hospitalization and ongoing recovery are:: to go home CMS Medicare.gov Compare Post Acute Care list provided to:: Patient Choice offered to / list presented to : Patient  Discharge Placement                       Discharge Plan and Services                          HH Arranged: PT, OT [facility] Agency: [place] Date [place]: 03/13/19 Time [place]: 3716 Representative spoke with at [place]: [place] ([place]) Interventions     Readmission Risk Interventions Readmission Risk Prevention Plan 02/22/2019 02/16/2019  Transportation Screening Complete Complete  PCP or Specialist Appt within 3-5 Days - Complete  HRI or [place] - Complete  Social Work Consult for [place] Planning/Counseling - Complete  Palliative Care Screening - Not Applicable  Medication Review Press photographer) Complete Complete  PCP or Specialist appointment within 3-5 days of discharge  Complete -  [place] or Home Care Consult Complete -  SW Recovery Care/Counseling Consult Complete -  Palliative Care Screening Not Applicable -  [place] Not Applicable -  Some recent data might be hidden

## 2019-03-13 NOTE — Progress Notes (Addendum)
  NEUROSURGERY PROGRESS NOTE   No issues overnight.  Persistent pain in LB and LLE as expected No new symptoms  EXAM:  BP (!) 140/56 (BP Location: Left Arm)   Pulse 69   Temp 97.7 F (36.5 C)   Resp 12   LMP  (LMP Unknown)   SpO2 99%   Awake, alert, oriented  Speech fluent, appropriate  CN grossly intact  MAEW with good strength  PLAN No significant change in pain level as expected MRI reviewed and shows improvement in prior compression of thecal sac at L4-5. Still with persistent lateral recess stenosis at L3-4.  No role for emergent surgery at present. Continue to work on pain control. Dr Ellene Route to f/u tomorrow Continue current medical management per [facility].

## 2019-03-13 NOTE — Plan of Care (Signed)
  Problem: Education: Goal: Knowledge of General Education information will improve Description: Including pain rating scale, medication(s)/side effects and non-pharmacologic comfort measures Outcome: Progressing   Problem: Health Behavior/Discharge Planning: Goal: Ability to manage health-related needs will improve Outcome: Progressing   Problem: Clinical Measurements: Goal: Ability to maintain clinical measurements within normal limits will improve Outcome: Progressing Goal: Will remain free from infection Outcome: Progressing   Problem: Activity: Goal: Risk for activity intolerance will decrease Outcome: Progressing   Problem: Nutrition: Goal: Adequate nutrition will be maintained Outcome: Progressing   Problem: Coping: Goal: Level of anxiety will decrease Outcome: Progressing   Problem: Pain Managment: Goal: General experience of comfort will improve Outcome: Progressing   Problem: Safety: Goal: Ability to remain free from injury will improve Outcome: Progressing   Problem: Skin Integrity: Goal: Risk for impaired skin integrity will decrease Outcome: Progressing   Problem: Clinical Measurements: Goal: Diagnostic test results will improve Outcome: Not Progressing   Problem: Elimination: Goal: Will not experience complications related to bowel motility Outcome: Not Met (add Reason) Goal: Will not experience complications related to urinary retention Outcome: Not Met (add Reason)   Problem: Clinical Measurements: Goal: Respiratory complications will improve Outcome: Not Applicable Goal: Cardiovascular complication will be avoided Outcome: Not Applicable

## 2019-03-14 ENCOUNTER — Inpatient Hospital Stay (HOSPITAL_COMMUNITY): Payer: PPO

## 2019-03-14 DIAGNOSIS — J449 Chronic obstructive pulmonary disease, unspecified: Secondary | ICD-10-CM | POA: Diagnosis present

## 2019-03-14 DIAGNOSIS — M0600 Rheumatoid arthritis without rheumatoid factor, unspecified site: Secondary | ICD-10-CM | POA: Diagnosis present

## 2019-03-14 DIAGNOSIS — M5126 Other intervertebral disc displacement, lumbar region: Secondary | ICD-10-CM | POA: Diagnosis present

## 2019-03-14 DIAGNOSIS — W010XXA Fall on same level from slipping, tripping and stumbling without subsequent striking against object, initial encounter: Secondary | ICD-10-CM | POA: Diagnosis present

## 2019-03-14 DIAGNOSIS — E876 Hypokalemia: Secondary | ICD-10-CM | POA: Diagnosis present

## 2019-03-14 DIAGNOSIS — F419 Anxiety disorder, unspecified: Secondary | ICD-10-CM | POA: Diagnosis present

## 2019-03-14 DIAGNOSIS — Y92009 Unspecified place in unspecified non-institutional (private) residence as the place of occurrence of the external cause: Secondary | ICD-10-CM | POA: Diagnosis not present

## 2019-03-14 DIAGNOSIS — S5012XA Contusion of left forearm, initial encounter: Secondary | ICD-10-CM | POA: Diagnosis present

## 2019-03-14 DIAGNOSIS — M5417 Radiculopathy, lumbosacral region: Secondary | ICD-10-CM | POA: Diagnosis present

## 2019-03-14 DIAGNOSIS — S72001D Fracture of unspecified part of neck of right femur, subsequent encounter for closed fracture with routine healing: Secondary | ICD-10-CM | POA: Diagnosis not present

## 2019-03-14 DIAGNOSIS — R339 Retention of urine, unspecified: Secondary | ICD-10-CM | POA: Diagnosis present

## 2019-03-14 DIAGNOSIS — S5011XA Contusion of right forearm, initial encounter: Secondary | ICD-10-CM | POA: Diagnosis present

## 2019-03-14 DIAGNOSIS — S80811A Abrasion, right lower leg, initial encounter: Secondary | ICD-10-CM | POA: Diagnosis present

## 2019-03-14 DIAGNOSIS — R51 Headache: Secondary | ICD-10-CM | POA: Diagnosis present

## 2019-03-14 DIAGNOSIS — S72001A Fracture of unspecified part of neck of right femur, initial encounter for closed fracture: Secondary | ICD-10-CM | POA: Diagnosis present

## 2019-03-14 DIAGNOSIS — N390 Urinary tract infection, site not specified: Secondary | ICD-10-CM | POA: Diagnosis present

## 2019-03-14 DIAGNOSIS — R338 Other retention of urine: Secondary | ICD-10-CM | POA: Diagnosis not present

## 2019-03-14 DIAGNOSIS — S72011A Unspecified intracapsular fracture of right femur, initial encounter for closed fracture: Secondary | ICD-10-CM | POA: Diagnosis present

## 2019-03-14 DIAGNOSIS — G834 Cauda equina syndrome: Secondary | ICD-10-CM | POA: Diagnosis present

## 2019-03-14 DIAGNOSIS — Z1159 Encounter for screening for other viral diseases: Secondary | ICD-10-CM | POA: Diagnosis not present

## 2019-03-14 DIAGNOSIS — Z981 Arthrodesis status: Secondary | ICD-10-CM | POA: Diagnosis not present

## 2019-03-14 DIAGNOSIS — E785 Hyperlipidemia, unspecified: Secondary | ICD-10-CM | POA: Diagnosis present

## 2019-03-14 DIAGNOSIS — I5032 Chronic diastolic (congestive) heart failure: Secondary | ICD-10-CM | POA: Diagnosis present

## 2019-03-14 DIAGNOSIS — W19XXXA Unspecified fall, initial encounter: Secondary | ICD-10-CM | POA: Diagnosis not present

## 2019-03-14 DIAGNOSIS — R9431 Abnormal electrocardiogram [ECG] [EKG]: Secondary | ICD-10-CM | POA: Diagnosis present

## 2019-03-14 DIAGNOSIS — D62 Acute posthemorrhagic anemia: Secondary | ICD-10-CM | POA: Diagnosis not present

## 2019-03-14 DIAGNOSIS — I110 Hypertensive heart disease with heart failure: Secondary | ICD-10-CM | POA: Diagnosis present

## 2019-03-14 DIAGNOSIS — M810 Age-related osteoporosis without current pathological fracture: Secondary | ICD-10-CM | POA: Diagnosis present

## 2019-03-14 DIAGNOSIS — F17210 Nicotine dependence, cigarettes, uncomplicated: Secondary | ICD-10-CM | POA: Diagnosis present

## 2019-03-14 DIAGNOSIS — E871 Hypo-osmolality and hyponatremia: Secondary | ICD-10-CM | POA: Diagnosis present

## 2019-03-14 LAB — BASIC METABOLIC PANEL
Anion gap: 10 (ref 5–15)
Anion gap: 6 (ref 5–15)
BUN: 23 mg/dL (ref 8–23)
BUN: 22 mg/dL (ref 8–23)
CO2: 20 mmol/L — ABNORMAL LOW (ref 22–32)
CO2: 24 mmol/L (ref 22–32)
Calcium: 8.3 mg/dL — ABNORMAL LOW (ref 8.9–10.3)
Calcium: 8.7 mg/dL — ABNORMAL LOW (ref 8.9–10.3)
Chloride: 92 mmol/L — ABNORMAL LOW (ref 98–111)
Chloride: 94 mmol/L — ABNORMAL LOW (ref 98–111)
Creatinine, Ser: 1.31 mg/dL — ABNORMAL HIGH (ref 0.44–1.00)
Creatinine, Ser: 1.33 mg/dL — ABNORMAL HIGH (ref 0.44–1.00)
GFR calc Af Amer: 46 mL/min — ABNORMAL LOW (ref >60)
GFR calc Af Amer: 46 mL/min — ABNORMAL LOW (ref >60)
GFR calc non Af Amer: 40 mL/min — ABNORMAL LOW (ref >60)
GFR calc non Af Amer: 39 mL/min — ABNORMAL LOW (ref >60)
Glucose, Bld: 113 mg/dL — ABNORMAL HIGH (ref 70–99)
Glucose, Bld: 126 mg/dL — ABNORMAL HIGH (ref 70–99)
Potassium: 4.8 mmol/L (ref 3.5–5.1)
Potassium: 5.5 mmol/L — ABNORMAL HIGH (ref 3.5–5.1)
Sodium: 122 mmol/L — ABNORMAL LOW (ref 135–145)
Sodium: 124 mmol/L — ABNORMAL LOW (ref 135–145)

## 2019-03-14 LAB — CBC
HCT: 25.9 % — ABNORMAL LOW (ref 36.0–46.0)
Hemoglobin: 9.1 g/dL — ABNORMAL LOW (ref 12.0–15.0)
MCH: 36.4 pg — ABNORMAL HIGH (ref 26.0–34.0)
MCHC: 35.1 g/dL (ref 30.0–36.0)
MCV: 103.6 fL — ABNORMAL HIGH (ref 80.0–100.0)
Platelets: 207 10*3/uL (ref 150–400)
RBC: 2.5 MIL/uL — ABNORMAL LOW (ref 3.87–5.11)
RDW: 15.6 % — ABNORMAL HIGH (ref 11.5–15.5)
WBC: 7.5 10*3/uL (ref 4.0–10.5)
nRBC: 0 % (ref 0.0–0.2)

## 2019-03-14 LAB — URINE CULTURE: Culture: >=100000 — AB

## 2019-03-14 MED ORDER — GABAPENTIN 300 MG PO CAPS
300.0000 mg | ORAL_CAPSULE | Freq: Every day | ORAL | Status: DC
  Administered 2019-03-14: 300 mg via ORAL
  Filled 2019-03-14: qty 1

## 2019-03-14 MED ORDER — ENSURE PRE-SURGERY PO LIQD
296.0000 mL | Freq: Once | ORAL | Status: AC
  Administered 2019-03-15: 05:00:00 296 mL via ORAL
  Filled 2019-03-14: qty 296

## 2019-03-14 MED ORDER — LIDOCAINE 5 % EX PTCH
1.0000 | MEDICATED_PATCH | CUTANEOUS | Status: DC
  Administered 2019-03-14 – 2019-03-19 (×5): 1 via TRANSDERMAL
  Filled 2019-03-14 (×6): qty 1

## 2019-03-14 MED ORDER — TRANEXAMIC ACID-NACL 1000-0.7 MG/100ML-% IV SOLN: 1000.0000 mg | INTRAVENOUS | Status: DC

## 2019-03-14 MED ORDER — POVIDONE-IODINE 10 % EX SWAB: 2.0000 "application " | Freq: Once | CUTANEOUS | Status: DC

## 2019-03-14 MED ORDER — ACETAMINOPHEN 500 MG PO TABS
1000.0000 mg | ORAL_TABLET | Freq: Once | ORAL | Status: AC
  Administered 2019-03-15: 1000 mg via ORAL
  Filled 2019-03-14: qty 2

## 2019-03-14 MED ORDER — GABAPENTIN 300 MG PO CAPS
300.0000 mg | ORAL_CAPSULE | Freq: Once | ORAL | Status: AC
  Administered 2019-03-15: 300 mg via ORAL
  Filled 2019-03-14: qty 1

## 2019-03-14 MED ORDER — CHLORHEXIDINE GLUCONATE 4 % EX LIQD
60.0000 mL | Freq: Once | CUTANEOUS | Status: AC
  Administered 2019-03-15: 4 via TOPICAL
  Filled 2019-03-14: qty 60

## 2019-03-14 MED ORDER — POLYETHYLENE GLYCOL 3350 17 G PO PACK
17.0000 g | PACK | Freq: Every day | ORAL | Status: DC
  Administered 2019-03-14 – 2019-03-16 (×3): 17 g via ORAL
  Filled 2019-03-14 (×4): qty 1

## 2019-03-14 MED ORDER — BISACODYL 10 MG RE SUPP
10.0000 mg | Freq: Once | RECTAL | Status: AC
  Administered 2019-03-14: 10 mg via RECTAL
  Filled 2019-03-14: qty 1

## 2019-03-14 MED ORDER — BISACODYL 5 MG PO TBEC: 5.0000 mg | DELAYED_RELEASE_TABLET | Freq: Every day | ORAL | Status: DC | PRN

## 2019-03-14 MED ORDER — BOOST / RESOURCE BREEZE PO LIQD CUSTOM
1.0000 | Freq: Three times a day (TID) | ORAL | Status: DC
  Administered 2019-03-14 – 2019-03-19 (×11): 1 via ORAL

## 2019-03-14 MED ORDER — SIMETHICONE 80 MG PO CHEW
80.0000 mg | CHEWABLE_TABLET | Freq: Once | ORAL | Status: AC
  Administered 2019-03-14: 01:00:00 80 mg via ORAL
  Filled 2019-03-14: qty 1

## 2019-03-14 MED ORDER — CEPHALEXIN 250 MG PO CAPS
250.0000 mg | ORAL_CAPSULE | Freq: Four times a day (QID) | ORAL | Status: DC
  Administered 2019-03-14 – 2019-03-17 (×13): 250 mg via ORAL
  Filled 2019-03-14 (×16): qty 1

## 2019-03-14 MED ORDER — CEFAZOLIN SODIUM-DEXTROSE 2-4 GM/100ML-% IV SOLN
2.0000 g | INTRAVENOUS | Status: AC
  Administered 2019-03-15: 2 g via INTRAVENOUS
  Filled 2019-03-14: qty 100

## 2019-03-14 NOTE — Progress Notes (Signed)
Patient ID: Regina Ortiz, female   DOB: Feb 02, 1946, 73 y.o.   MRN: [id number] Vital signs are stable Patient readmitted with hyponatremia and significant pain CT and MRI of the lumbar spine from yesterday reviewed No significant pathology other than old spondylosis in the lumbar spine Her canal does appear better decompressed on the latest images than they have previously Continue supportive care

## 2019-03-14 NOTE — Progress Notes (Signed)
Pt back from MRI, was very upset at length of time in MRI machine. I provided listening presence and educated about procedure. MD called to notify pt of MRI results - R femur fracture - and that ortho will be consulted. I gave pt pain medication including lidocaine patch to sacrum.  Pt had BM earlier but is still very concerned about moving her bowels, states she is "afraid" of how much it will hurt, thinks she did not have much of a BM. I told her it was a medium amount, gave suppository and Miralax. Pt notes that usually she takes Miralax BID for three days and then has a BM.

## 2019-03-14 NOTE — Progress Notes (Signed)
Initial Nutrition Assessment  RD working remotely.  DOCUMENTATION CODES:   Not applicable  INTERVENTION:   - Please obtain admission weight  - Boost Breeze po TID, each supplement provides 250 kcal and 9 grams of protein  - Encourage adequate PO intake  NUTRITION DIAGNOSIS:   Increased nutrient needs related to chronic illness (COPD) as evidenced by estimated needs.  GOAL:   Patient will meet greater than or equal to 90% of their needs  MONITOR:   PO intake, Supplement acceptance, Labs, Weight trends, Skin, I & O's  REASON FOR ASSESSMENT:   Malnutrition Screening Tool    ASSESSMENT:   73 year old female who presented to the ED on 7/11 after a fall. PMH of herniated disc L4/L5 with cauda equina syndrome s/p decompression 01/28/19, COPD, chronic hyponatremia, GERD, essential HTN, rheumatoid arthritis, chronic venous insufficiency. Pt admitted with hyponatremia.  Unable to reach pt via phone call to room.  Reviewed RD notes from previous admission in June 2020. Per RD note from 6/16, Ensure Enlive causes pt to have abdominal discomfort so RD ordered Boost Breeze which pt tolerated well. This RD will order Boost Breeze to aid pt in meeting kcal and protein needs. Per RD note from 6/16, pt reported having a decreased appetite due to pain but also reported she ate at least 2 meals daily.  Unable to obtain diet history from pt at this time.  Per RN note from this AM, pt reports constipation and states that she is chronically constipated. Pt is currently on a fluid restriction for hyponatremia that is likely to make constipation worse.  No weight recorded this admission. Using weight from 02/14/19, weight appears stable over the last year.  Meal Completion: 20-75% x 3 meals  Medications reviewed and include: Dulcolax once, Miralax, Prednisone, Senna  Labs reviewed: sodium 122, creatinine 1.31, hemoglobin 9.1  NUTRITION - FOCUSED PHYSICAL EXAM:  Unable to complete at this  time. RD working remotely.  Diet Order:   Diet Order            Diet Heart Room service appropriate? No; Fluid consistency: Thin; Fluid restriction: 1500 mL Fluid  Diet effective now              EDUCATION NEEDS:   No education needs have been identified at this time  Skin:  Skin Assessment: Skin Integrity Issues: Stage II: mid coccyx  Last BM:  03/09/19  Height:   Ht Readings from Last 1 Encounters:  02/14/19 5\' 4"  (1.626 m)    Weight:   Wt Readings from Last 1 Encounters:  02/14/19 47.6 kg    Ideal Body Weight:  54.5 kg (calculated using height from 6/15)  BMI:  There is no height or weight on file to calculate BMI.  Estimated Nutritional Needs:   Kcal:  1500-1700  Protein:  75-90 grams  Fluid:  >/= 1.5 L    Gaynell Face, MS, RD, LDN Inpatient Clinical Dietitian Pager: [phone] Weekend/After Hours: [phone]

## 2019-03-14 NOTE — Consult Note (Signed)
I have reviewed images and will plan on OR on 7/14 for R hip pinning.  Formal consult to follow   Renette Butters

## 2019-03-14 NOTE — Progress Notes (Signed)
Pt reports pain in lower sacrum and tail bone, 7/10 "it's killing me". Given prn medication, see MAR.  Pt also reports constipation: she states she is chronically constipated, that she will take a Miralax in the morning, then one in the evening, then three days later will have a bowel movement, but it will be hard and difficult to pass. She is concerned with her current pain about passing stool. Discussed stool softeners and that she is on twice daily sennakot. Of note, pt on fluid restriction for hyponatremia.  Per night shift report, a neighbor of the patient who is a nurse called in and stated that the patient's extensive bruising is not from her fall or crawling to a phone but from prednisone use. The neighbor states no one is addressing the prednisone use.

## 2019-03-14 NOTE — Progress Notes (Signed)
PT Cancellation Note  Patient Details Name: Regina Ortiz MRN: [id number] DOB: 09-05-1945   Cancelled Treatment:    Reason Eval/Treat Not Completed: Patient at procedure or test/unavailable. WIll re-attempt when available.    Regina Ortiz 03/14/2019, 3:14 PM

## 2019-03-14 NOTE — Progress Notes (Signed)
PROGRESS NOTE    Regina Ortiz  DGL:[id number] DOB: Jan 07, 1946 DOA: 03/12/2019 PCP: Maurice Small, MD  Brief Narrative: 73 year old female with history of lumbar spinal stenosis, cauda equina underwent L4-L5 decompression and fusion by Dr. Ellene Route on 5/29, subsequently discharged to rehab on 6/4 and and then home after a week was seen in the emergency room on 6/15 with urinary retention she was discharged home with a Foley catheter, with plans for urology follow-up, lives by herself, requires assistance in a lot of ADLs, -Readmitted 7/11 with worsening low back pain and left lower extremity pain following a fall which was mechanical.  -In the emergency room she was noted to have sodium of 118, creatinine of 0.9, CT scan of the head C-spine were negative for acute abnormalities x-ray of the pelvis could not rule out pubic ramus fracture  Assessment & Plan:     Hyponatremia -Acute on chronic -Has baseline hyponatremia with sodium in the 125 range, admission sodium was 118, last hospitalization losartan and Aldactone were discontinued -TSH was within normal range, cortisol was low end of normal however patient is on prednisone -Urine sodium is less than 10, urine osmolarity is low at around 100, clinically appears euvolemic, suspect this is secondary to tea and toast diet -IV fluids discontinued, patient was put on fluid restriction, she admits to drinking large quantities of water throughout the day -Sodium up to 122 this morning, IV fluids discontinued yesterday, monitor sodium today  Fall, worsening low back pain and tailbone pain -Repeat pelvic x-rays rule out pubic rami fracture, today reports that her pain is mostly in her tailbone which was not imaged on her last MRI LS-spine will obtain MRI of her sacrum to rule out sacral ala fractures -Given low-dose NSAIDs yesterday however given worsening creatinine will hold this -Continue gabapentin, IV morphine for severe pain -Patient is having  significant limitation of activity secondary to this pain -Physical therapy again today to try to attempt to take a few steps  Low back pain/L4-L5 disc herniation status post surgical decompression and stabilization with pedicle screw fixation on 5/29 and by Dr. Ellene Route  -Foley catheter placed 6/15 following ED visit for retention , this was changed 4 days ago  -Neurosurgery following, seen by Dr. Ellene Route today, supportive care recommended -MRI showed improvement in prior compression of thecal sac at L4-L5 with persistent lateral recess stenosis at L3-L4   Leukocytosis:  -Likely reactive, resolved   Possible UTI versus colonization  -Did have mild leukocytosis on admission however afebrile, no concrete symptoms suggestive of infection at this time, started on IV ceftriaxone on admission -Transition to oral Keflex to complete 3-day course -Foley catheter placed 6/15 last changed on 7/9, due to follow-up with urology this month  Prolonged QT interval: Acute on chronic.  QTC noted to be 540 on admission. -Given mag yesterday, QTC is 502 yesterday a.m., hold QT prolonging meds -Potassium corrected  Constipation: - Patient reports last bowel movement 3 to 4 days ago.  Patient on pain medications with tramadol at home with MiraLAX. -Continue MiraLAX and Senokot, add Dulcolax suppository  Hypokalemia: -Replaced  Macrocytic anemia:  -Improving, monitor, it was 8 g/dL last checked on 6/17.  Patient with elevated MCV and MCH, but vitamin B12 was 1298 and folate 34 when checked just last month.   Essential hypertension: Stable -Continue metoprolol, diltiazem, and hydralazine as tolerated  Rheumatoid arthritis on immunosuppressive therapy: Patient with seronegative rheumatoid arthritis currently on on long term prednisone -Continue prednisone and Plaquenil  COPD, without acute exacerbation: Stable. -Albuterol breathing treatments as needed  Chronic diastolic congestive heart failure:  Patient appears to be euvolemic at this time.  Last echo from 09/2017 revealed EF 55-60 % with grade 1 diastolic dysfunction. -Continue to monitor, not on diuretics  Hyperlipidemia -Continue atorvastatin  Anxiety -Continue BuSpar and Klonopin as needed for anxiety  Osteoporosis -Continue Fosamax at discharge  DVT prophylaxis: Lovenox Code Status: Full Family Communication: No family present at bedside Disposition Plan:  Home with home health services tomorrow if stable    Consultants:   NSG   Procedures:   Antimicrobials:    Subjective: -Continues to have severe low back pain and tailbone pain now, denies any pelvic discomfort, feels really depressed and anxious -Complains of constipation, does not feel ready to go home today  Objective: Vitals:   03/13/19 0604 03/13/19 1407 03/13/19 2104 03/14/19 0546  BP: (!) 140/56 (!) 145/61 (!) 131/59 126/61  Pulse: 69 67 64 67  Resp: 12 18 18 10   Temp: 97.7 F (36.5 C)  98 F (36.7 C) 97.9 F (36.6 C)  TempSrc:      SpO2: 99% 98% 99% 98%    Intake/Output Summary (Last 24 hours) at 03/14/2019 1207 Last data filed at 03/13/2019 1737 Gross per 24 hour  Intake 270 ml  Output -  Net 270 ml   There were no vitals filed for this visit.  Examination:  Gen: Elderly chronically ill female alert awake oriented x3 HEENT: PERRLA, Neck supple, no JVD Lungs: Diminished breath sounds at both bases CVS: RRR,No Gallops,Rubs or new Murmurs Abd: soft, Non tender, non distended, BS present Extremities: No edema Central nervous system: Alert and oriented.  4+ strength in both lower extremities, sensations intact Skin: Extensive bruises both arms and legs  psychiatry: Judgement and insight appear normal. Mood & affect appropriate.     Data Reviewed:   CBC: Recent Labs  Lab 03/12/19 0403 03/13/19 0246 03/14/19 0319  WBC 11.9* 8.1 7.5  NEUTROABS 9.9*  --   --   HGB 10.8* 9.8* 9.1*  HCT 29.8* 26.8* 25.9*  MCV 100.7*  101.5* 103.6*  PLT 225 207 324   Basic Metabolic Panel: Recent Labs  Lab 03/12/19 0955 03/13/19 0246 03/13/19 1050 03/13/19 1644 03/14/19 0319  NA 120* 118* 118* 118* 122*  K 3.3* 3.9 4.2 4.4 4.8  CL 86* 88* 89* 90* 92*  CO2 21* 20* 18* 20* 20*  GLUCOSE 101* 87 143* 259* 113*  BUN 12 14 14 17 23   CREATININE 0.85 1.03* 1.13* 1.25* 1.31*  CALCIUM 8.4* 8.1* 8.1* 8.1* 8.3*  MG  --  2.2  --   --   --    GFR: CrCl cannot be calculated (Unknown ideal weight.). Liver Function Tests: No results for input(s): AST, ALT, ALKPHOS, BILITOT, PROT, ALBUMIN in the last 168 hours. No results for input(s): LIPASE, AMYLASE in the last 168 hours. No results for input(s): AMMONIA in the last 168 hours. Coagulation Profile: No results for input(s): INR, PROTIME in the last 168 hours. Cardiac Enzymes: No results for input(s): CKTOTAL, CKMB, CKMBINDEX, TROPONINI in the last 168 hours. BNP (last 3 results) No results for input(s): PROBNP in the last 8760 hours. HbA1C: No results for input(s): HGBA1C in the last 72 hours. CBG: No results for input(s): GLUCAP in the last 168 hours. Lipid Profile: No results for input(s): CHOL, HDL, LDLCALC, TRIG, CHOLHDL, LDLDIRECT in the last 72 hours. Thyroid Function Tests: No results for input(s): TSH, T4TOTAL, FREET4,  T3FREE, THYROIDAB in the last 72 hours. Anemia Panel: No results for input(s): VITAMINB12, FOLATE, FERRITIN, TIBC, IRON, RETICCTPCT in the last 72 hours. Urine analysis:    Component Value Date/Time   COLORURINE YELLOW 03/12/2019 1148   APPEARANCEUR CLEAR 03/12/2019 1148   LABSPEC 1.003 (L) 03/12/2019 1148   PHURINE 7.0 03/12/2019 1148   GLUCOSEU NEGATIVE 03/12/2019 1148   HGBUR NEGATIVE 03/12/2019 1148   BILIRUBINUR NEGATIVE 03/12/2019 1148   KETONESUR NEGATIVE 03/12/2019 1148   PROTEINUR 30 (A) 03/12/2019 1148   UROBILINOGEN 0.2 09/22/2012 1100   NITRITE POSITIVE (A) 03/12/2019 1148   LEUKOCYTESUR MODERATE (A) 03/12/2019 1148    Sepsis Labs: @LABRCNTIP (procalcitonin:4,lacticidven:4)  ) Recent Results (from the past 240 hour(s))  SARS Coronavirus 2 (CEPHEID - Performed in [place] hospital lab), Hosp Order     Status: None   Collection Time: 03/12/19  6:17 AM   Specimen: Nasopharyngeal Swab  Result Value Ref Range Status   SARS Coronavirus 2 NEGATIVE NEGATIVE Final    Comment: (NOTE) If result is NEGATIVE SARS-CoV-2 target nucleic acids are NOT DETECTED. The SARS-CoV-2 RNA is generally detectable in upper and lower  respiratory specimens during the acute phase of infection. The lowest  concentration of SARS-CoV-2 viral copies this assay can detect is 250  copies / mL. A negative result does not preclude SARS-CoV-2 infection  and should not be used as the sole basis for treatment or other  patient management decisions.  A negative result may occur with  improper specimen collection / handling, submission of specimen other  than nasopharyngeal swab, presence of viral mutation(s) within the  areas targeted by this assay, and inadequate number of viral copies  (<250 copies / mL). A negative result must be combined with clinical  observations, patient history, and epidemiological information. If result is POSITIVE SARS-CoV-2 target nucleic acids are DETECTED. The SARS-CoV-2 RNA is generally detectable in upper and lower  respiratory specimens dur ing the acute phase of infection.  Positive  results are indicative of active infection with SARS-CoV-2.  Clinical  correlation with patient history and other diagnostic information is  necessary to determine patient infection status.  Positive results do  not rule out bacterial infection or co-infection with other viruses. If result is PRESUMPTIVE POSTIVE SARS-CoV-2 nucleic acids MAY BE PRESENT.   A presumptive positive result was obtained on the submitted specimen  and confirmed on repeat testing.  While 2019 novel coronavirus  (SARS-CoV-2) nucleic acids may be  present in the submitted sample  additional confirmatory testing may be necessary for epidemiological  and / or clinical management purposes  to differentiate between  SARS-CoV-2 and other Sarbecovirus currently known to infect humans.  If clinically indicated additional testing with an alternate test  methodology [phone]) is advised. The SARS-CoV-2 RNA is generally  detectable in upper and lower respiratory sp ecimens during the acute  phase of infection. The expected result is Negative. Fact Sheet for Patients:  StrictlyIdeas.no Fact Sheet for Healthcare Providers: BankingDealers.co.za This test is not yet approved or cleared by the [place] FDA and has been authorized for detection and/or diagnosis of SARS-CoV-2 by FDA under an Emergency Use Authorization (EUA).  This EUA will remain in effect (meaning this test can be used) for the duration of the COVID-19 declaration under Section 564(b)(1) of the Act, 21 U.S.C. section 360bbb-3(b)(1), unless the authorization is terminated or revoked sooner. Performed at [facility], [place] [address] [place], [place] [address]   Culture, Urine  Status: Abnormal   Collection Time: 03/12/19 11:42 AM   Specimen: Urine, Catheterized  Result Value Ref Range Status   Specimen Description URINE, CATHETERIZED  Final   Special Requests   Final    NONE Performed at [facility], [address], [place], [place] [address]    Culture >=100,000 COLONIES/mL ESCHERICHIA COLI (A)  Final   Report Status 03/14/2019 FINAL  Final   Organism ID, Bacteria ESCHERICHIA COLI (A)  Final      Susceptibility   Escherichia coli - MIC*    AMPICILLIN <=2 SENSITIVE Sensitive     CEFAZOLIN <=4 SENSITIVE Sensitive     CEFTRIAXONE <=1 SENSITIVE Sensitive     CIPROFLOXACIN <=0.25 SENSITIVE Sensitive     GENTAMICIN <=1 SENSITIVE Sensitive     IMIPENEM <=0.25 SENSITIVE Sensitive     NITROFURANTOIN <=16  SENSITIVE Sensitive     TRIMETH/SULFA <=20 SENSITIVE Sensitive     AMPICILLIN/SULBACTAM <=2 SENSITIVE Sensitive     PIP/TAZO <=4 SENSITIVE Sensitive     Extended ESBL NEGATIVE Sensitive     * >=100,000 COLONIES/mL ESCHERICHIA COLI         Radiology Studies: Dg Pelvis 1-2 Views  Result Date: 03/13/2019 CLINICAL DATA:  Pelvic pain. Status post fall last night. Initial encounter. EXAM: PELVIS - 1-2 VIEW COMPARISON:  Single-view of the pelvis 03/12/2019. CT abdomen and pelvis 02/15/2019. FINDINGS: Possible right parasymphyseal pubic bone fracture seen on yesterday's examination is not visible today. No acute bony or joint abnormality is identified. The patient is status post L4-5 fusion. IMPRESSION: No acute abnormality. Possible right parasymphyseal pubic bone fracture on yesterday's examination is likely due to overlapping shadows. Electronically Signed   By: Inge Rise M.D.   On: 03/13/2019 14:08   Mr Lumbar Spine W Wo Contrast  Result Date: 03/12/2019 CLINICAL DATA:  Patient status post L4-5 fusion 01/28/2019. Low back and left leg pain since the procedure. EXAM: MRI LUMBAR SPINE WITHOUT AND WITH CONTRAST TECHNIQUE: Multiplanar and multiecho pulse sequences of the lumbar spine were obtained without and with intravenous contrast. CONTRAST:  5 cc Gadavist IV. COMPARISON:  MRI lumbar spine 02/15/2019. CT lumbar spine this same day. FINDINGS: Segmentation:  Standard. Alignment:  Trace anterolisthesis L4 on L5.  Otherwise maintained. Vertebrae: No fracture, evidence of discitis, or bone lesion. Status post L4-5 fusion as described below. Conus medullaris and cauda equina: Conus extends to the L1 level. Conus and cauda equina appear normal. Paraspinal and other soft tissues: Left renal cyst noted. Disc levels: T10-11 and T11-12 are imaged in the sagittal plane only and negative. T12-L1: Negative. L1-2: Mild facet degenerative change.  Otherwise negative. L2-3: Minimal disc bulge and  mild-to-moderate facet arthropathy. No stenosis. L3-4: There is a disc bulge eccentric to the left and mild to moderate facet arthropathy. Dorsal epidural fat is prominent. Moderate compression of the thecal sac and left worse than right subarticular recess narrowing appear unchanged. Neural foramina are open. L4-5: Status post laminectomy and discectomy and fusion. Based on comparison with prior CT scan, acute or protruding into the disc interspace compatible with interbody graft and likely disc. Compression of the thecal sac seen on the prior MRI has markedly improved likely due to resolving postoperative edema. The central canal is now open. Mild to moderate bilateral foraminal narrowing appears greater on the left and unchanged. L5-S1: Bilateral facet degenerative change and a shallow disc bulge. The central canal is open. Marked right foraminal narrowing is unchanged. Left foramen is minimally narrowed. IMPRESSION: Status  post L4-5 fusion. Compression of the thecal sac seen on the prior MRI has markedly improved since the prior examination likely due to resolving postoperative edema. Mild to moderate foraminal narrowing at this level appears greater on the left and not notably changed. No change in marked right foraminal narrowing at L5-S1. No change in moderate compression of the thecal sac at L3-4 due to dorsal epidural fat and disc bulge to the left. Left worse than right subarticular recess narrowing at this level is also unchanged. Electronically Signed   By: Inge Rise M.D.   On: 03/12/2019 14:32        Scheduled Meds: . atorvastatin  40 mg Oral Daily  . bisacodyl  10 mg Rectal Once  . busPIRone  30 mg Oral BID  . carvedilol  6.25 mg Oral BID WC  . diltiazem  360 mg Oral Daily  . enoxaparin (LOVENOX) injection  40 mg Subcutaneous Q24H  . gabapentin  300 mg Oral BID  . hydrALAZINE  75 mg Oral TID  . hydroxychloroquine  200 mg Oral Daily  . polyethylene glycol  17 g Oral Daily  .  predniSONE  5 mg Oral Q breakfast  . senna-docusate  1 tablet Oral BID  . sodium chloride flush  3 mL Intravenous Q12H   Continuous Infusions:   LOS: 0 days    Time spent:97min    Domenic Polite, MD [facility] Page via [email or web address], password [email or web address] After 7PM please contact night-coverage  03/14/2019, 12:07 PM

## 2019-03-14 NOTE — Progress Notes (Signed)
OT Cancellation Note  Patient Details Name: Regina Ortiz MRN: [id number] DOB: 04-Apr-1946   Cancelled Treatment:    Reason Eval/Treat Not Completed: Patient at procedure or test/ unavailable(Pt is off the unit for MRI). OT will follow up when available.  Gypsy Decant, MS, OTR/L 03/14/2019, 2:25 PM

## 2019-03-14 NOTE — Progress Notes (Signed)
Pt's son called, upset that he had not been kept in the loop. Michela Pitcher his mother told him she was having surgery and he wants to be consulted in these conversations. I updated him on pt's situation and plan of care. Will pass on to MD Broadus John, MD Percell Miller, and next shift that he would like updates.

## 2019-03-15 ENCOUNTER — Inpatient Hospital Stay (HOSPITAL_COMMUNITY): Payer: PPO | Admitting: Anesthesiology

## 2019-03-15 ENCOUNTER — Encounter (HOSPITAL_COMMUNITY)
Admission: EM | Disposition: A | Discharge status: Skilled Nursing Facility | Payer: Self-pay | Source: Home / Self Care | Attending: Internal Medicine

## 2019-03-15 ENCOUNTER — Inpatient Hospital Stay (HOSPITAL_COMMUNITY): Payer: PPO

## 2019-03-15 ENCOUNTER — Encounter (HOSPITAL_COMMUNITY): Payer: Self-pay | Admitting: Anesthesiology

## 2019-03-15 DIAGNOSIS — L8990 Pressure ulcer of unspecified site, unspecified stage: Secondary | ICD-10-CM | POA: Insufficient documentation

## 2019-03-15 HISTORY — PX: HIP PINNING,CANNULATED: SHX1758

## 2019-03-15 LAB — BASIC METABOLIC PANEL
Anion gap: 8 (ref 5–15)
BUN: 23 mg/dL (ref 8–23)
CO2: 23 mmol/L (ref 22–32)
Calcium: 9 mg/dL (ref 8.9–10.3)
Chloride: 97 mmol/L — ABNORMAL LOW (ref 98–111)
Creatinine, Ser: 0.98 mg/dL (ref 0.44–1.00)
GFR calc Af Amer: >60 mL/min (ref >60)
GFR calc non Af Amer: 57 mL/min — ABNORMAL LOW (ref >60)
Glucose, Bld: 111 mg/dL — ABNORMAL HIGH (ref 70–99)
Potassium: 4.9 mmol/L (ref 3.5–5.1)
Sodium: 128 mmol/L — ABNORMAL LOW (ref 135–145)

## 2019-03-15 LAB — CBC
HCT: 24.9 % — ABNORMAL LOW (ref 36.0–46.0)
Hemoglobin: 8.8 g/dL — ABNORMAL LOW (ref 12.0–15.0)
MCH: 36.5 pg — ABNORMAL HIGH (ref 26.0–34.0)
MCHC: 35.3 g/dL (ref 30.0–36.0)
MCV: 103.3 fL — ABNORMAL HIGH (ref 80.0–100.0)
Platelets: 205 10*3/uL (ref 150–400)
RBC: 2.41 MIL/uL — ABNORMAL LOW (ref 3.87–5.11)
RDW: 15.9 % — ABNORMAL HIGH (ref 11.5–15.5)
WBC: 6.7 10*3/uL (ref 4.0–10.5)
nRBC: 0 % (ref 0.0–0.2)

## 2019-03-15 SURGERY — FIXATION, FEMUR, NECK, PERCUTANEOUS, USING SCREW
Anesthesia: General | Site: Hip | Laterality: Right

## 2019-03-15 MED ORDER — FENTANYL CITRATE (PF) 100 MCG/2ML IJ SOLN
INTRAMUSCULAR | Status: AC
  Filled 2019-03-15: qty 2

## 2019-03-15 MED ORDER — ALBUMIN HUMAN 5 % IV SOLN: INTRAVENOUS | Status: DC | PRN

## 2019-03-15 MED ORDER — ENOXAPARIN SODIUM 40 MG/0.4ML ~~LOC~~ SOLN: 40.0000 mg | SUBCUTANEOUS | 0 refills | Status: DC

## 2019-03-15 MED ORDER — OXYCODONE HCL 5 MG PO TABS
ORAL_TABLET | ORAL | Status: AC
  Filled 2019-03-15: qty 1

## 2019-03-15 MED ORDER — CEFAZOLIN SODIUM-DEXTROSE 2-4 GM/100ML-% IV SOLN
2.0000 g | Freq: Four times a day (QID) | INTRAVENOUS | Status: AC
  Administered 2019-03-15 – 2019-03-16 (×2): 2 g via INTRAVENOUS
  Filled 2019-03-15 (×2): qty 100

## 2019-03-15 MED ORDER — HYDROCODONE-ACETAMINOPHEN 5-325 MG PO TABS
1.0000 | ORAL_TABLET | Freq: Four times a day (QID) | ORAL | Status: DC | PRN
  Administered 2019-03-15 – 2019-03-16 (×2): 1 via ORAL
  Filled 2019-03-15 (×2): qty 1

## 2019-03-15 MED ORDER — POLYETHYLENE GLYCOL 3350 17 G PO PACK: 17.0000 g | PACK | Freq: Every day | ORAL | Status: DC | PRN

## 2019-03-15 MED ORDER — 0.9 % SODIUM CHLORIDE (POUR BTL) OPTIME
TOPICAL | Status: DC | PRN
  Administered 2019-03-15: 1000 mL

## 2019-03-15 MED ORDER — ACETAMINOPHEN 500 MG PO TABS
500.0000 mg | ORAL_TABLET | Freq: Four times a day (QID) | ORAL | Status: AC
  Administered 2019-03-15 – 2019-03-16 (×4): 500 mg via ORAL
  Filled 2019-03-15 (×4): qty 1

## 2019-03-15 MED ORDER — FENTANYL CITRATE (PF) 100 MCG/2ML IJ SOLN
INTRAMUSCULAR | Status: DC | PRN
  Administered 2019-03-15: 25 ug via INTRAVENOUS
  Administered 2019-03-15: 50 ug via INTRAVENOUS

## 2019-03-15 MED ORDER — DEXAMETHASONE SODIUM PHOSPHATE 10 MG/ML IJ SOLN
INTRAMUSCULAR | Status: AC
  Filled 2019-03-15: qty 1

## 2019-03-15 MED ORDER — FENTANYL CITRATE (PF) 100 MCG/2ML IJ SOLN
25.0000 ug | INTRAMUSCULAR | Status: DC | PRN
  Administered 2019-03-15 (×2): 25 ug via INTRAVENOUS

## 2019-03-15 MED ORDER — HYDROMORPHONE HCL 1 MG/ML IJ SOLN
0.2500 mg | INTRAMUSCULAR | Status: DC | PRN
  Administered 2019-03-15 – 2019-03-16 (×4): 0.5 mg via INTRAVENOUS
  Filled 2019-03-15 (×5): qty 0.5

## 2019-03-15 MED ORDER — DOCUSATE SODIUM 100 MG PO CAPS
100.0000 mg | ORAL_CAPSULE | Freq: Two times a day (BID) | ORAL | Status: DC
  Administered 2019-03-15 – 2019-03-16 (×4): 100 mg via ORAL
  Filled 2019-03-15 (×8): qty 1

## 2019-03-15 MED ORDER — METOCLOPRAMIDE HCL 5 MG/ML IJ SOLN: 5.0000 mg | Freq: Three times a day (TID) | INTRAMUSCULAR | Status: DC | PRN

## 2019-03-15 MED ORDER — ALBUMIN HUMAN 5 % IV SOLN
INTRAVENOUS | Status: DC | PRN
  Administered 2019-03-15: 13:00:00 via INTRAVENOUS

## 2019-03-15 MED ORDER — ONDANSETRON HCL 4 MG/2ML IJ SOLN: 4.0000 mg | Freq: Four times a day (QID) | INTRAMUSCULAR | Status: DC | PRN

## 2019-03-15 MED ORDER — HYDROCODONE-ACETAMINOPHEN 5-325 MG PO TABS: 1.0000 | ORAL_TABLET | Freq: Four times a day (QID) | ORAL | 0 refills | Status: AC | PRN

## 2019-03-15 MED ORDER — SUCCINYLCHOLINE CHLORIDE 200 MG/10ML IV SOSY
PREFILLED_SYRINGE | INTRAVENOUS | Status: AC
  Filled 2019-03-15: qty 10

## 2019-03-15 MED ORDER — ONDANSETRON HCL 4 MG/2ML IJ SOLN
INTRAMUSCULAR | Status: DC | PRN
  Administered 2019-03-15: 4 mg via INTRAVENOUS

## 2019-03-15 MED ORDER — PROPOFOL 10 MG/ML IV BOLUS
INTRAVENOUS | Status: DC | PRN
  Administered 2019-03-15: 75 mg via INTRAVENOUS

## 2019-03-15 MED ORDER — SUCCINYLCHOLINE CHLORIDE 20 MG/ML IJ SOLN
INTRAMUSCULAR | Status: DC | PRN
  Administered 2019-03-15: 100 mg via INTRAVENOUS

## 2019-03-15 MED ORDER — BUPIVACAINE HCL (PF) 0.25 % IJ SOLN
INTRAMUSCULAR | Status: DC | PRN
  Administered 2019-03-15: 10 mL

## 2019-03-15 MED ORDER — ACETAMINOPHEN 325 MG PO TABS
325.0000 mg | ORAL_TABLET | Freq: Four times a day (QID) | ORAL | Status: DC | PRN
  Administered 2019-03-17 – 2019-03-19 (×2): 650 mg via ORAL
  Filled 2019-03-15 (×2): qty 2

## 2019-03-15 MED ORDER — MIDAZOLAM HCL 2 MG/2ML IJ SOLN
INTRAMUSCULAR | Status: AC
  Filled 2019-03-15: qty 2

## 2019-03-15 MED ORDER — EPHEDRINE 5 MG/ML INJ
INTRAVENOUS | Status: AC
  Filled 2019-03-15: qty 10

## 2019-03-15 MED ORDER — PHENYLEPHRINE 40 MCG/ML (10ML) SYRINGE FOR IV PUSH (FOR BLOOD PRESSURE SUPPORT)
PREFILLED_SYRINGE | INTRAVENOUS | Status: AC
  Filled 2019-03-15: qty 10

## 2019-03-15 MED ORDER — ONDANSETRON HCL 4 MG PO TABS: 4.0000 mg | ORAL_TABLET | Freq: Four times a day (QID) | ORAL | Status: DC | PRN

## 2019-03-15 MED ORDER — METOCLOPRAMIDE HCL 10 MG PO TABS: 5.0000 mg | ORAL_TABLET | Freq: Three times a day (TID) | ORAL | Status: DC | PRN

## 2019-03-15 MED ORDER — LACTATED RINGERS IV SOLN
INTRAVENOUS | Status: DC
  Administered 2019-03-15 – 2019-03-17 (×2): via INTRAVENOUS

## 2019-03-15 MED ORDER — FENTANYL CITRATE (PF) 250 MCG/5ML IJ SOLN
INTRAMUSCULAR | Status: AC
  Filled 2019-03-15: qty 5

## 2019-03-15 MED ORDER — OXYCODONE HCL 5 MG/5ML PO SOLN: 5.0000 mg | Freq: Once | ORAL | Status: AC | PRN

## 2019-03-15 MED ORDER — OXYCODONE HCL 5 MG PO TABS
5.0000 mg | ORAL_TABLET | Freq: Once | ORAL | Status: AC | PRN
  Administered 2019-03-15: 5 mg via ORAL

## 2019-03-15 MED ORDER — ONDANSETRON HCL 4 MG/2ML IJ SOLN
INTRAMUSCULAR | Status: AC
  Filled 2019-03-15: qty 2

## 2019-03-15 MED ORDER — LIDOCAINE 2% (20 MG/ML) 5 ML SYRINGE
INTRAMUSCULAR | Status: AC
  Filled 2019-03-15: qty 5

## 2019-03-15 MED ORDER — LIDOCAINE 2% (20 MG/ML) 5 ML SYRINGE
INTRAMUSCULAR | Status: DC | PRN
  Administered 2019-03-15: 40 mg via INTRAVENOUS

## 2019-03-15 MED ORDER — BUPIVACAINE HCL (PF) 0.25 % IJ SOLN
INTRAMUSCULAR | Status: AC
  Filled 2019-03-15: qty 30

## 2019-03-15 SURGICAL SUPPLY — 42 items
BIT DRILL 4.9 CANNULATED (BIT) ×1
BIT DRILL CANN QC 4.9 LRG (BIT) IMPLANT
BNDG COHESIVE 4X5 TAN STRL (GAUZE/BANDAGES/DRESSINGS) ×3 IMPLANT
BNDG GAUZE ELAST 4 BULKY (GAUZE/BANDAGES/DRESSINGS) ×3 IMPLANT
CLOSURE WOUND 1/2 X4 (GAUZE/BANDAGES/DRESSINGS) ×1
COVER PERINEAL POST (MISCELLANEOUS) ×3 IMPLANT
COVER SURGICAL LIGHT HANDLE (MISCELLANEOUS) ×3 IMPLANT
COVER WAND RF STERILE (DRAPES) ×3 IMPLANT
DRAPE STERI IOBAN 125X83 (DRAPES) ×3 IMPLANT
DRILL BIT CANNULATED 4.9 (BIT) ×3
DRSG MEPILEX BORDER 4X4 (GAUZE/BANDAGES/DRESSINGS) ×3 IMPLANT
DRSG MEPILEX BORDER 4X8 (GAUZE/BANDAGES/DRESSINGS) ×2 IMPLANT
DRSG TEGADERM 2-3/8X2-3/4 SM (GAUZE/BANDAGES/DRESSINGS) ×2 IMPLANT
DURAPREP 26ML APPLICATOR (WOUND CARE) ×3 IMPLANT
ELECT REM PT RETURN 9FT ADLT (ELECTROSURGICAL) ×3
ELECTRODE REM PT RTRN 9FT ADLT (ELECTROSURGICAL) ×1 IMPLANT
GLOVE BIOGEL PI IND STRL 8 (GLOVE) ×2 IMPLANT
GLOVE BIOGEL PI INDICATOR 8 (GLOVE) ×4
GLOVE SURG SS PI 7.5 STRL IVOR (GLOVE) ×8 IMPLANT
GOWN STRL REUS W/ TWL LRG LVL3 (GOWN DISPOSABLE) ×3 IMPLANT
GOWN STRL REUS W/TWL LRG LVL3 (GOWN DISPOSABLE) ×9
GUIDEWIRE THRD ASNIS 3.2X300 (WIRE) ×6 IMPLANT
KIT BASIN OR (CUSTOM PROCEDURE TRAY) ×3 IMPLANT
KIT TURNOVER KIT B (KITS) ×3 IMPLANT
MANIFOLD NEPTUNE II (INSTRUMENTS) ×3 IMPLANT
NS IRRIG 1000ML POUR BTL (IV SOLUTION) ×3 IMPLANT
PACK GENERAL/GYN (CUSTOM PROCEDURE TRAY) ×3 IMPLANT
PAD ARMBOARD 7.5X6 YLW CONV (MISCELLANEOUS) ×6 IMPLANT
SCREW ASNIS 90MM (Screw) ×2 IMPLANT
SCREW CANN 6.5X80 STRL (Screw) ×2 IMPLANT
SCREW CANN ASNIS STRL 6.5X75 (Screw) ×4 IMPLANT
STRIP CLOSURE SKIN 1/2X4 (GAUZE/BANDAGES/DRESSINGS) ×1 IMPLANT
SUT MNCRL AB 4-0 PS2 18 (SUTURE) ×2 IMPLANT
SUT MON AB 2-0 CT1 36 (SUTURE) ×1 IMPLANT
SUT VIC AB 0 CT1 27 (SUTURE) ×2
SUT VIC AB 0 CT1 27XBRD ANBCTR (SUTURE) IMPLANT
SUT VIC AB 2-0 CT1 27 (SUTURE) ×2
SUT VIC AB 2-0 CT1 TAPERPNT 27 (SUTURE) IMPLANT
SYR CONTROL 10ML LL (SYRINGE) ×2 IMPLANT
TOWEL GREEN STERILE (TOWEL DISPOSABLE) ×3 IMPLANT
TOWEL GREEN STERILE FF (TOWEL DISPOSABLE) ×3 IMPLANT
WATER STERILE IRR 1000ML POUR (IV SOLUTION) ×3 IMPLANT

## 2019-03-15 NOTE — Interval H&P Note (Signed)
I participated in the care of this patient and agree with the above history, physical and evaluation. I performed a review of the history and a physical exam as detailed   Timothy Daniel Murphy MD  

## 2019-03-15 NOTE — Transfer of Care (Signed)
Immediate Anesthesia Transfer of Care Note  Patient: Regina Ortiz  Procedure(s) Performed: PINNING OF FEMORAL NECK FRACTURE (Right Hip)  Patient Location: PACU  Anesthesia Type:General  Level of Consciousness: awake, alert , oriented and sedated  Airway & Oxygen Therapy: Patient Spontanous Breathing and Patient connected to nasal cannula oxygen  Post-op Assessment: Report given to RN, Post -op Vital signs reviewed and stable and Patient moving all extremities  Post vital signs: Reviewed and stable  Last Vitals:  Vitals Value Taken Time  BP 177/71 03/15/19 1402  Temp    Pulse 66 03/15/19 1415  Resp 7 03/15/19 1415  SpO2 100 % 03/15/19 1415  Vitals shown include unvalidated device data.  Last Pain:  Vitals:   03/15/19 0529  TempSrc: Oral  PainSc:       Patients Stated Pain Goal: 3 (82/[id number])  Complications: No apparent anesthesia complications

## 2019-03-15 NOTE — Progress Notes (Signed)
Patient temp checked when brought back from procedure is 94.6. called md and notified. Applied bear hugger for low temp. continuing to monitor.

## 2019-03-15 NOTE — Consult Note (Signed)
Reason for Consult:Right hip fx Referring Physician: P Regina Ortiz is an 73 y.o. female.  HPI: Regina Ortiz was going to turn the thermostat down and got tangled up in her RW. It fell and she tried to stabilize herself against the wall but ended up falling anyway on her right side. She had immediate right hip pain and could not get up. She crawled to a phone and called EMS. Workup in the ED showed a right femoral neck fx and orthopedic surgery was consulted. She recently had lumbar surgery and has significant difficulties w/r/t this.  Past Medical History:  Diagnosis Date  . AIN grade I   . Anxiety   . Bruises easily   . Chronic diarrhea   . Chronic hyponatremia   . Colitis   . COPD (chronic obstructive pulmonary disease) ([place])   . Depression   . GERD (gastroesophageal reflux disease)   . History of chronic bronchitis   . History of vulvar dysplasia    serveral recurrency's   . Hypertension   . LBBB (left bundle branch block) W/ PROLONGED PR   CARDIOLOGSIT-  DR Johnsie Cancel-   LOV IN EPIC  . Macrocytosis   . Nocturia   . Osteoporosis   . Seronegative rheumatoid arthritis ([place])   . Short of breath on exertion   . Smokers' cough ([place])   . Venous insufficiency of leg    , Edema  . VIN III (vulvar intraepithelial neoplasia III)    RECURRENT -    Past Surgical History:  Procedure Laterality Date  . APPENDECTOMY  1992  . CARDIOVASCULAR STRESS TEST  06-23-2012  DR Jenkins [place]   NORMAL LEXISCAN MYOVIEW/ EF 83%/  NO ISCHEMIA  . CO2 LASER APPLICATION  99/83/3825   Procedure: CO2 LASER APPLICATION;  Surgeon: Janie Morning, MD PHD;  Location: [facility];  Service: Gynecology;  Laterality: N/A;  Laser Vaporization  . CO2 LASER APPLICATION N/A 0/12/3974   Procedure: CO2 LASER APPLICATION;  Surgeon: [name], MD;  Location: [facility];  Service: Gynecology;  Laterality: N/A;  . CO2 LASER APPLICATION N/A 7/34/1937   Procedure: CO2 LASER APPLICATION of the  vulva;  Surgeon: Janie Morning, MD;  Location: [facility];  Service: Gynecology;  Laterality: N/A;  . EXCISION VULVAR LESIONS  07/2012  . HEMORRHOID SURGERY N/A 04/15/2013   Procedure: Excision of external and internal  hemorrhoid with AIN, Exam under anesthesia;  Surgeon: Odis Hollingshead, MD;  Location: [facility];  Service: General;  Laterality: N/A;  . REPAIR FISTULA IN ANO/  I & D PERIRECTAL ABSCESS  10-26-2002  DR Johnathan Hausen  . TRANSTHORACIC ECHOCARDIOGRAM  06-23-2012   NORMAL LVSF/ EF 55-60%  . VAGINAL HYSTERECTOMY  1997   partial  . VULVAR LESION REMOVAL N/A 11/03/2014   Procedure: CO2 LASER OF VULVAR ;  Surgeon: [name], MD;  Location: [facility];  Service: Gynecology;  Laterality: N/A;  . VULVECTOMY N/A 03/26/2015   Procedure: WIDE LOCA  EXCISION OF VULVAR;  Surgeon: [name], MD;  Location: [place];  Service: Gynecology;  Laterality: N/A;  . Wide Local Excision of labia majora  04-04-2010   Right-sided lesion, CO2 ablation of right labia minora    Family History  Problem Relation Age of Onset  . Diabetes Mother   . Hypertension Father   . Lymphoma Son   . Cancer Son        lymphoblastic lymphoma    Social  History:  reports that she has been smoking cigarettes. She has a 12.50 pack-year smoking history. She has never used smokeless tobacco. She reports current alcohol use of about 2.0 standard drinks of alcohol per week. She reports that she does not use drugs.  Allergies:  Allergies  Allergen Reactions  . Latex Swelling and Other (See Comments)     fever blisters  . Augmentin [Amoxicillin-Pot Clavulanate] Nausea And Vomiting  . Doxycycline Other (See Comments)    Blurred vision, nervous, unsteady  . Lactose Intolerance (Gi) Other (See Comments)    Unknown, per pt  . Lialda [Mesalamine] Diarrhea  . Prinivil [Lisinopril] Diarrhea  . Triamcinolone Acetonide Other (See Comments)    REDNESS AND PAIN  . Amlodipine  Rash  . Lotensin [Benazepril Hcl] Rash    Medications: I have reviewed the patient's current medications.  Results for orders placed or performed during the hospital encounter of 03/12/19 (from the past 48 hour(s))  Basic metabolic panel     Status: Abnormal   Collection Time: 03/13/19 10:50 AM  Result Value Ref Range   Sodium 118 (LL) 135 - 145 mmol/L    Comment: CRITICAL RESULT CALLED TO, READ BACK BY AND VERIFIED WITH: V SMITH,RN AT 1123 03/13/2019 BY L BENFIELD    Potassium 4.2 3.5 - 5.1 mmol/L   Chloride 89 (L) 98 - 111 mmol/L   CO2 18 (L) 22 - 32 mmol/L   Glucose, Bld 143 (H) 70 - 99 mg/dL   BUN 14 8 - 23 mg/dL   Creatinine, Ser 1.13 (H) 0.44 - 1.00 mg/dL   Calcium 8.1 (L) 8.9 - 10.3 mg/dL   GFR calc non Af Amer 48 (L) >60 mL/min   GFR calc Af Amer 55 (L) >60 mL/min   Anion gap 11 5 - 15    Comment: Performed at [facility], [address], [place],  [address]  Basic metabolic panel     Status: Abnormal   Collection Time: 03/13/19  4:44 PM  Result Value Ref Range   Sodium 118 (LL) 135 - 145 mmol/L    Comment: CRITICAL RESULT CALLED TO, READ BACK BY AND VERIFIED WITH: NERT, V RN @ 1741 ON 03/13/2019 BY TEMOCHE, H    Potassium 4.4 3.5 - 5.1 mmol/L   Chloride 90 (L) 98 - 111 mmol/L   CO2 20 (L) 22 - 32 mmol/L   Glucose, Bld 259 (H) 70 - 99 mg/dL   BUN 17 8 - 23 mg/dL   Creatinine, Ser 1.25 (H) 0.44 - 1.00 mg/dL   Calcium 8.1 (L) 8.9 - 10.3 mg/dL   GFR calc non Af Amer 42 (L) >60 mL/min   GFR calc Af Amer 49 (L) >60 mL/min   Anion gap 8 5 - 15    Comment: Performed at [facility] [address], [place], [place] [address]  CBC     Status: Abnormal   Collection Time: 03/14/19  3:19 AM  Result Value Ref Range   WBC 7.5 4.0 - 10.5 K/uL   RBC 2.50 (L) 3.87 - 5.11 MIL/uL   Hemoglobin 9.1 (L) 12.0 - 15.0 g/dL   HCT 25.9 (L) 36.0 - 46.0 %   MCV 103.6 (H) 80.0 - 100.0 fL   MCH 36.4 (H) 26.0 - 34.0 pg   MCHC 35.1 30.0 - 36.0 g/dL   RDW 15.6 (H) 11.5  - 15.5 %   Platelets 207 150 - 400 K/uL   nRBC 0.0 0.0 - 0.2 %  Comment: Performed at [facility], [place] [address], [place], [place] [address]  Basic metabolic panel     Status: Abnormal   Collection Time: 03/14/19  3:19 AM  Result Value Ref Range   Sodium 122 (L) 135 - 145 mmol/L   Potassium 4.8 3.5 - 5.1 mmol/L   Chloride 92 (L) 98 - 111 mmol/L   CO2 20 (L) 22 - 32 mmol/L   Glucose, Bld 113 (H) 70 - 99 mg/dL   BUN 23 8 - 23 mg/dL   Creatinine, Ser 1.31 (H) 0.44 - 1.00 mg/dL   Calcium 8.3 (L) 8.9 - 10.3 mg/dL   GFR calc non Af Amer 40 (L) >60 mL/min   GFR calc Af Amer 46 (L) >60 mL/min   Anion gap 10 5 - 15    Comment: Performed at [facility] [address], [place], [place] [address]  Basic metabolic panel     Status: Abnormal   Collection Time: 03/14/19  3:29 PM  Result Value Ref Range   Sodium 124 (L) 135 - 145 mmol/L   Potassium 5.5 (H) 3.5 - 5.1 mmol/L   Chloride 94 (L) 98 - 111 mmol/L   CO2 24 22 - 32 mmol/L   Glucose, Bld 126 (H) 70 - 99 mg/dL   BUN 22 8 - 23 mg/dL   Creatinine, Ser 1.33 (H) 0.44 - 1.00 mg/dL   Calcium 8.7 (L) 8.9 - 10.3 mg/dL   GFR calc non Af Amer 39 (L) >60 mL/min   GFR calc Af Amer 46 (L) >60 mL/min   Anion gap 6 5 - 15    Comment: Performed at [facility] [address], [place], [place] [address]  CBC     Status: Abnormal   Collection Time: 03/15/19  3:40 AM  Result Value Ref Range   WBC 6.7 4.0 - 10.5 K/uL   RBC 2.41 (L) 3.87 - 5.11 MIL/uL   Hemoglobin 8.8 (L) 12.0 - 15.0 g/dL   HCT 24.9 (L) 36.0 - 46.0 %   MCV 103.3 (H) 80.0 - 100.0 fL   MCH 36.5 (H) 26.0 - 34.0 pg   MCHC 35.3 30.0 - 36.0 g/dL   RDW 15.9 (H) 11.5 - 15.5 %   Platelets 205 150 - 400 K/uL   nRBC 0.0 0.0 - 0.2 %    Comment: Performed at [facility], [place] [address] [place], [place] [address]  Basic metabolic panel     Status: Abnormal   Collection Time: 03/15/19  3:40 AM  Result Value Ref Range   Sodium 128 (L) 135 - 145 mmol/L    Potassium 4.9 3.5 - 5.1 mmol/L   Chloride 97 (L) 98 - 111 mmol/L   CO2 23 22 - 32 mmol/L   Glucose, Bld 111 (H) 70 - 99 mg/dL   BUN 23 8 - 23 mg/dL   Creatinine, Ser 0.98 0.44 - 1.00 mg/dL   Calcium 9.0 8.9 - 10.3 mg/dL   GFR calc non Af Amer 57 (L) >60 mL/min   GFR calc Af Amer >60 >60 mL/min   Anion gap 8 5 - 15    Comment: Performed at [facility] [address] [place], [place] [address]    Dg Pelvis 1-2 Views  Result Date: 03/13/2019 CLINICAL DATA:  Pelvic pain. Status post fall last night. Initial encounter. EXAM: PELVIS - 1-2 VIEW COMPARISON:  Single-view of the pelvis 03/12/2019. CT abdomen and pelvis 02/15/2019. FINDINGS: Possible  right parasymphyseal pubic bone fracture seen on yesterday's examination is not visible today. No acute bony or joint abnormality is identified. The patient is status post L4-5 fusion. IMPRESSION: No acute abnormality. Possible right parasymphyseal pubic bone fracture on yesterday's examination is likely due to overlapping shadows. Electronically Signed   By: Inge Rise M.D.   On: 03/13/2019 14:08   Mr Sacrum Si Joints Wo Contrast  Result Date: 03/14/2019 CLINICAL DATA:  Recent fall. Tailbone pain. Evaluate for sacral insufficiency fracture. EXAM: MRI SACRUM WITHOUT CONTRAST TECHNIQUE: Multiplanar, multisequence MR imaging of the sacrum was performed. No intravenous contrast was administered. COMPARISON:  Pelvic x-rays from yesterday. CT abdomen pelvis dated February 15, 2019. FINDINGS: Bones/Joint/Cartilage Acute minimally impacted subcapital right femoral neck fracture. No sacral ala fracture. No dislocation. No joint effusion. Prior L4-L5 fusion. Muscles and Tendons Grossly intact.  No muscle edema or atrophy. Soft tissue No fluid collection or hematoma. No soft tissue mass. Foley catheter in the bladder. IMPRESSION: 1. Acute minimally impacted subcapital right femoral neck fracture. Consider dedicated right hip x-rays for further evaluation. 2. No  additional acute osseous abnormality.  No sacral ala fracture. These results will be called to the ordering clinician or representative by the Radiologist Assistant, and communication documented in the PACS or zVision Dashboard. Electronically Signed   By: Titus Dubin M.D.   On: 03/14/2019 15:40   Dg Hip Unilat With Pelvis 2-3 Views Right  Result Date: 03/14/2019 CLINICAL DATA:  Fall several days ago with known right hip fracture EXAM: DG HIP (WITH OR WITHOUT PELVIS) 3V RIGHT COMPARISON:  03/12/2019 FINDINGS: Impacted right femoral neck fracture is seen similar to that noted on prior MRI. Postsurgical changes in the lumbar spine are seen. No other fracture is noted. IMPRESSION: Mildly impacted right femoral neck fracture. No other focal abnormality is seen. Electronically Signed   By: Inez Catalina M.D.   On: 03/14/2019 18:13    Review of Systems  Constitutional: Negative for weight loss.  HENT: Negative for ear discharge, ear pain, hearing loss and tinnitus.   Eyes: Negative for blurred vision, double vision, photophobia and pain.  Respiratory: Negative for cough, sputum production and shortness of breath.   Cardiovascular: Negative for chest pain.  Gastrointestinal: Negative for abdominal pain, nausea and vomiting.  Genitourinary: Negative for dysuria, flank pain, frequency and urgency.  Musculoskeletal: Positive for back pain and joint pain (Right hip). Negative for falls, myalgias and neck pain.  Neurological: Negative for dizziness, tingling, sensory change, focal weakness, loss of consciousness and headaches.  Endo/Heme/Allergies: Does not bruise/bleed easily.  Psychiatric/Behavioral: Negative for depression, memory loss and substance abuse. The patient is not nervous/anxious.    Blood pressure (!) 158/63, pulse 83, temperature 98.6 F (37 C), temperature source Oral, resp. rate 19, SpO2 99 %. Physical Exam  Constitutional: She appears well-developed and well-nourished. No distress.   HENT:  Head: Normocephalic and atraumatic.  Eyes: Conjunctivae are normal. Right eye exhibits no discharge. Left eye exhibits no discharge. No scleral icterus.  Neck: Normal range of motion.  Cardiovascular: Normal rate and regular rhythm.  Respiratory: Effort normal. No respiratory distress.  Musculoskeletal:     Comments: RLE No traumatic wounds, ecchymosis, or rash  Hip mild TTP  No knee or ankle effusion  Knee stable to varus/ valgus and anterior/posterior stress  Sens DPN, SPN, TN intact  Motor EHL, ext, flex, evers 5/5  DP 2+, PT 2+, No significant edema  Neurological: She is alert.  Skin: Skin is warm and  dry. She is not diaphoretic.  Psychiatric: She has a normal mood and affect. Her behavior is normal.    Assessment/Plan: Right hip fx -- For cannulated hip pinning this afternoon by Dr. Percell Miller. Please keep NPO until then. Multiple medical problems including herniated disks L4/L5 with cauda equina syndrome s/Regina surgical decompression on 01/28/2019 by Dr. Ellene Route, hypertension, chronic hyponatremia, rheumatoid arthritis, COPD, macrocytic anemia, and GERD -- per primary service    Lisette Abu, PA-C Orthopedic Surgery [phone] 03/15/2019, 9:12 AM

## 2019-03-15 NOTE — Progress Notes (Signed)
Patient ID: Regina Ortiz, female   DOB: 1946/02/25, 73 y.o.   MRN: [id number] Vital signs stable Intertrochanteric hip fracture noted Patient to have surgical intervention this afternoon Offered encouragement

## 2019-03-15 NOTE — H&P (View-Only) (Signed)
Reason for Consult:Right hip fx Referring Physician: P Saleema Ortiz is an 73 y.o. female.  HPI: Regina Ortiz was going to turn the thermostat down and got tangled up in her RW. It fell and she tried to stabilize herself against the wall but ended up falling anyway on her right side. She had immediate right hip pain and could not get up. She crawled to a phone and called EMS. Workup in the ED showed a right femoral neck fx and orthopedic surgery was consulted. She recently had lumbar surgery and has significant difficulties w/r/t this.  Past Medical History:  Diagnosis Date  . AIN grade I   . Anxiety   . Bruises easily   . Chronic diarrhea   . Chronic hyponatremia   . Colitis   . COPD (chronic obstructive pulmonary disease) ([place])   . Depression   . GERD (gastroesophageal reflux disease)   . History of chronic bronchitis   . History of vulvar dysplasia    serveral recurrency's   . Hypertension   . LBBB (left bundle branch block) W/ PROLONGED PR   CARDIOLOGSIT-  DR Johnsie Cancel-   LOV IN EPIC  . Macrocytosis   . Nocturia   . Osteoporosis   . Seronegative rheumatoid arthritis ([place])   . Short of breath on exertion   . Smokers' cough ([place])   . Venous insufficiency of leg    , Edema  . VIN III (vulvar intraepithelial neoplasia III)    RECURRENT -    Past Surgical History:  Procedure Laterality Date  . APPENDECTOMY  1992  . CARDIOVASCULAR STRESS TEST  06-23-2012  DR Jenkins [place]   NORMAL LEXISCAN MYOVIEW/ EF 83%/  NO ISCHEMIA  . CO2 LASER APPLICATION  16/06/9603   Procedure: CO2 LASER APPLICATION;  Surgeon: Janie Morning, MD PHD;  Location[facility];  Service: Gynecology;  Laterality: N/A;  Laser Vaporization  . CO2 LASER APPLICATION N/A 01/03/980   Procedure: CO2 LASER APPLICATION;  Surgeon: [name], MD;  Location: [facility];  Service: Gynecology;  Laterality: N/A;  . CO2 LASER APPLICATION N/A 1/91/4782   Procedure: CO2 LASER APPLICATION of the  vulva;  Surgeon: Janie Morning, MD;  Location: [facility];  Service: Gynecology;  Laterality: N/A;  . EXCISION VULVAR LESIONS  07/2012  . HEMORRHOID SURGERY N/A 04/15/2013   Procedure: Excision of external and internal  hemorrhoid with AIN, Exam under anesthesia;  Surgeon: Odis Hollingshead, MD;  Location: [facility];  Service: General;  Laterality: N/A;  . REPAIR FISTULA IN ANO/  I & D PERIRECTAL ABSCESS  10-26-2002  DR Johnathan Hausen  . TRANSTHORACIC ECHOCARDIOGRAM  06-23-2012   NORMAL LVSF/ EF 55-60%  . VAGINAL HYSTERECTOMY  1997   partial  . VULVAR LESION REMOVAL N/A 11/03/2014   Procedure: CO2 LASER OF VULVAR ;  Surgeon: [name], MD;  Location: [facility];  Service: Gynecology;  Laterality: N/A;  . VULVECTOMY N/A 03/26/2015   Procedure: WIDE LOCA  EXCISION OF VULVAR;  Surgeon: [name], MD;  Location: [place];  Service: Gynecology;  Laterality: N/A;  . Wide Local Excision of labia majora  04-04-2010   Right-sided lesion, CO2 ablation of right labia minora    Family History  Problem Relation Age of Onset  . Diabetes Mother   . Hypertension Father   . Lymphoma Son   . Cancer Son        lymphoblastic lymphoma    Social  History:  reports that she has been smoking cigarettes. She has a 12.50 pack-year smoking history. She has never used smokeless tobacco. She reports current alcohol use of about 2.0 standard drinks of alcohol per week. She reports that she does not use drugs.  Allergies:  Allergies  Allergen Reactions  . Latex Swelling and Other (See Comments)     fever blisters  . Augmentin [Amoxicillin-Pot Clavulanate] Nausea And Vomiting  . Doxycycline Other (See Comments)    Blurred vision, nervous, unsteady  . Lactose Intolerance (Gi) Other (See Comments)    Unknown, per pt  . Lialda [Mesalamine] Diarrhea  . Prinivil [Lisinopril] Diarrhea  . Triamcinolone Acetonide Other (See Comments)    REDNESS AND PAIN  . Amlodipine  Rash  . Lotensin [Benazepril Hcl] Rash    Medications: I have reviewed the patient's current medications.  Results for orders placed or performed during the hospital encounter of 03/12/19 (from the past 48 hour(s))  Basic metabolic panel     Status: Abnormal   Collection Time: 03/13/19 10:50 AM  Result Value Ref Range   Sodium 118 (LL) 135 - 145 mmol/L    Comment: CRITICAL RESULT CALLED TO, READ BACK BY AND VERIFIED WITH: V SMITH,RN AT 1123 03/13/2019 BY L BENFIELD    Potassium 4.2 3.5 - 5.1 mmol/L   Chloride 89 (L) 98 - 111 mmol/L   CO2 18 (L) 22 - 32 mmol/L   Glucose, Bld 143 (H) 70 - 99 mg/dL   BUN 14 8 - 23 mg/dL   Creatinine, Ser 1.13 (H) 0.44 - 1.00 mg/dL   Calcium 8.1 (L) 8.9 - 10.3 mg/dL   GFR calc non Af Amer 48 (L) >60 mL/min   GFR calc Af Amer 55 (L) >60 mL/min   Anion gap 11 5 - 15    Comment: Performed at [facility], [address], [place], [place] [address]  Basic metabolic panel     Status: Abnormal   Collection Time: 03/13/19  4:44 PM  Result Value Ref Range   Sodium 118 (LL) 135 - 145 mmol/L    Comment: CRITICAL RESULT CALLED TO, READ BACK BY AND VERIFIED WITH: NERT, V RN @ 1741 ON 03/13/2019 BY TEMOCHE, H    Potassium 4.4 3.5 - 5.1 mmol/L   Chloride 90 (L) 98 - 111 mmol/L   CO2 20 (L) 22 - 32 mmol/L   Glucose, Bld 259 (H) 70 - 99 mg/dL   BUN 17 8 - 23 mg/dL   Creatinine, Ser 1.25 (H) 0.44 - 1.00 mg/dL   Calcium 8.1 (L) 8.9 - 10.3 mg/dL   GFR calc non Af Amer 42 (L) >60 mL/min   GFR calc Af Amer 49 (L) >60 mL/min   Anion gap 8 5 - 15    Comment: Performed at [facility] [address], [place], [place] [address]  CBC     Status: Abnormal   Collection Time: 03/14/19  3:19 AM  Result Value Ref Range   WBC 7.5 4.0 - 10.5 K/uL   RBC 2.50 (L) 3.87 - 5.11 MIL/uL   Hemoglobin 9.1 (L) 12.0 - 15.0 g/dL   HCT 25.9 (L) 36.0 - 46.0 %   MCV 103.6 (H) 80.0 - 100.0 fL   MCH 36.4 (H) 26.0 - 34.0 pg   MCHC 35.1 30.0 - 36.0 g/dL   RDW 15.6 (H) 11.5  - 15.5 %   Platelets 207 150 - 400 K/uL   nRBC 0.0 0.0 - 0.2 %  Comment: Performed at [facility], [place] [address] [place], [place] [address]  Basic metabolic panel     Status: Abnormal   Collection Time: 03/14/19  3:19 AM  Result Value Ref Range   Sodium 122 (L) 135 - 145 mmol/L   Potassium 4.8 3.5 - 5.1 mmol/L   Chloride 92 (L) 98 - 111 mmol/L   CO2 20 (L) 22 - 32 mmol/L   Glucose, Bld 113 (H) 70 - 99 mg/dL   BUN 23 8 - 23 mg/dL   Creatinine, Ser 1.31 (H) 0.44 - 1.00 mg/dL   Calcium 8.3 (L) 8.9 - 10.3 mg/dL   GFR calc non Af Amer 40 (L) >60 mL/min   GFR calc Af Amer 46 (L) >60 mL/min   Anion gap 10 5 - 15    Comment: Performed at [facility] [address] [place], [place] [address]  Basic metabolic panel     Status: Abnormal   Collection Time: 03/14/19  3:29 PM  Result Value Ref Range   Sodium 124 (L) 135 - 145 mmol/L   Potassium 5.5 (H) 3.5 - 5.1 mmol/L   Chloride 94 (L) 98 - 111 mmol/L   CO2 24 22 - 32 mmol/L   Glucose, Bld 126 (H) 70 - 99 mg/dL   BUN 22 8 - 23 mg/dL   Creatinine, Ser 1.33 (H) 0.44 - 1.00 mg/dL   Calcium 8.7 (L) 8.9 - 10.3 mg/dL   GFR calc non Af Amer 39 (L) >60 mL/min   GFR calc Af Amer 46 (L) >60 mL/min   Anion gap 6 5 - 15    Comment: Performed at [facility] [address], [place], [place] [address]  CBC     Status: Abnormal   Collection Time: 03/15/19  3:40 AM  Result Value Ref Range   WBC 6.7 4.0 - 10.5 K/uL   RBC 2.41 (L) 3.87 - 5.11 MIL/uL   Hemoglobin 8.8 (L) 12.0 - 15.0 g/dL   HCT 24.9 (L) 36.0 - 46.0 %   MCV 103.3 (H) 80.0 - 100.0 fL   MCH 36.5 (H) 26.0 - 34.0 pg   MCHC 35.3 30.0 - 36.0 g/dL   RDW 15.9 (H) 11.5 - 15.5 %   Platelets 205 150 - 400 K/uL   nRBC 0.0 0.0 - 0.2 %    Comment: Performed at [facility], [place] [address] [place], [place] [address]  Basic metabolic panel     Status: Abnormal   Collection Time: 03/15/19  3:40 AM  Result Value Ref Range   Sodium 128 (L) 135 - 145 mmol/L    Potassium 4.9 3.5 - 5.1 mmol/L   Chloride 97 (L) 98 - 111 mmol/L   CO2 23 22 - 32 mmol/L   Glucose, Bld 111 (H) 70 - 99 mg/dL   BUN 23 8 - 23 mg/dL   Creatinine, Ser 0.98 0.44 - 1.00 mg/dL   Calcium 9.0 8.9 - 10.3 mg/dL   GFR calc non Af Amer 57 (L) >60 mL/min   GFR calc Af Amer >60 >60 mL/min   Anion gap 8 5 - 15    Comment: Performed at [facility] [address], [place], [place] [address]    Dg Pelvis 1-2 Views  Result Date: 03/13/2019 CLINICAL DATA:  Pelvic pain. Status post fall last night. Initial encounter. EXAM: PELVIS - 1-2 VIEW COMPARISON:  Single-view of the pelvis 03/12/2019. CT abdomen and pelvis 02/15/2019. FINDINGS: Possible  right parasymphyseal pubic bone fracture seen on yesterday's examination is not visible today. No acute bony or joint abnormality is identified. The patient is status post L4-5 fusion. IMPRESSION: No acute abnormality. Possible right parasymphyseal pubic bone fracture on yesterday's examination is likely due to overlapping shadows. Electronically Signed   By: Inge Rise M.D.   On: 03/13/2019 14:08   Mr Sacrum Si Joints Wo Contrast  Result Date: 03/14/2019 CLINICAL DATA:  Recent fall. Tailbone pain. Evaluate for sacral insufficiency fracture. EXAM: MRI SACRUM WITHOUT CONTRAST TECHNIQUE: Multiplanar, multisequence MR imaging of the sacrum was performed. No intravenous contrast was administered. COMPARISON:  Pelvic x-rays from yesterday. CT abdomen pelvis dated February 15, 2019. FINDINGS: Bones/Joint/Cartilage Acute minimally impacted subcapital right femoral neck fracture. No sacral ala fracture. No dislocation. No joint effusion. Prior L4-L5 fusion. Muscles and Tendons Grossly intact.  No muscle edema or atrophy. Soft tissue No fluid collection or hematoma. No soft tissue mass. Foley catheter in the bladder. IMPRESSION: 1. Acute minimally impacted subcapital right femoral neck fracture. Consider dedicated right hip x-rays for further evaluation. 2. No  additional acute osseous abnormality.  No sacral ala fracture. These results will be called to the ordering clinician or representative by the Radiologist Assistant, and communication documented in the PACS or zVision Dashboard. Electronically Signed   By: Titus Dubin M.D.   On: 03/14/2019 15:40   Dg Hip Unilat With Pelvis 2-3 Views Right  Result Date: 03/14/2019 CLINICAL DATA:  Fall several days ago with known right hip fracture EXAM: DG HIP (WITH OR WITHOUT PELVIS) 3V RIGHT COMPARISON:  03/12/2019 FINDINGS: Impacted right femoral neck fracture is seen similar to that noted on prior MRI. Postsurgical changes in the lumbar spine are seen. No other fracture is noted. IMPRESSION: Mildly impacted right femoral neck fracture. No other focal abnormality is seen. Electronically Signed   By: Inez Catalina M.D.   On: 03/14/2019 18:13    Review of Systems  Constitutional: Negative for weight loss.  HENT: Negative for ear discharge, ear pain, hearing loss and tinnitus.   Eyes: Negative for blurred vision, double vision, photophobia and pain.  Respiratory: Negative for cough, sputum production and shortness of breath.   Cardiovascular: Negative for chest pain.  Gastrointestinal: Negative for abdominal pain, nausea and vomiting.  Genitourinary: Negative for dysuria, flank pain, frequency and urgency.  Musculoskeletal: Positive for back pain and joint pain (Right hip). Negative for falls, myalgias and neck pain.  Neurological: Negative for dizziness, tingling, sensory change, focal weakness, loss of consciousness and headaches.  Endo/Heme/Allergies: Does not bruise/bleed easily.  Psychiatric/Behavioral: Negative for depression, memory loss and substance abuse. The patient is not nervous/anxious.    Blood pressure (!) 158/63, pulse 83, temperature 98.6 F (37 C), temperature source Oral, resp. rate 19, SpO2 99 %. Physical Exam  Constitutional: She appears well-developed and well-nourished. No distress.   HENT:  Head: Normocephalic and atraumatic.  Eyes: Conjunctivae are normal. Right eye exhibits no discharge. Left eye exhibits no discharge. No scleral icterus.  Neck: Normal range of motion.  Cardiovascular: Normal rate and regular rhythm.  Respiratory: Effort normal. No respiratory distress.  Musculoskeletal:     Comments: RLE No traumatic wounds, ecchymosis, or rash  Hip mild TTP  No knee or ankle effusion  Knee stable to varus/ valgus and anterior/posterior stress  Sens DPN, SPN, TN intact  Motor EHL, ext, flex, evers 5/5  DP 2+, PT 2+, No significant edema  Neurological: She is alert.  Skin: Skin is warm and  dry. She is not diaphoretic.  Psychiatric: She has a normal mood and affect. Her behavior is normal.    Assessment/Plan: Right hip fx -- For cannulated hip pinning this afternoon by Dr. Percell Miller. Please keep NPO until then. Multiple medical problems including herniated disks L4/L5 with cauda equina syndrome s/Regina surgical decompression on 01/28/2019 by Dr. Ellene Route, hypertension, chronic hyponatremia, rheumatoid arthritis, COPD, macrocytic anemia, and GERD -- per primary service    Lisette Abu, PA-C Orthopedic Surgery [phone] 03/15/2019, 9:12 AM

## 2019-03-15 NOTE — Progress Notes (Signed)
PROGRESS NOTE    Regina Ortiz  VFI:[id number] DOB: 05/21/1946 DOA: 03/12/2019 PCP: Maurice Small, MD  Brief Narrative: 73 year old female with history of lumbar spinal stenosis, cauda equina underwent L4-L5 decompression and fusion by Dr. Ellene Route on 5/29, subsequently discharged to rehab on 6/4 and and then home after a week was seen in the emergency room on 6/15 with urinary retention she was discharged home with a Foley catheter, with plans for urology follow-up, lives by herself, requires assistance in a lot of ADLs, -Readmitted 7/11 with worsening low back pain and left lower extremity pain following a fall which was mechanical.  -In the emergency room she was noted to have sodium of 118, creatinine of 0.9, initial imaging did not note any injuries. -Patient continued to complain of tailbone pain and pelvic discomfort, subsequently had an MRI of her sacrum 7/13 which surprisingly showed right subcapital femur neck fracture, orthopedics consulted  Assessment & Plan:     Hyponatremia -Acute on chronic -Has baseline hyponatremia with sodium in the 125 range, admission sodium was 118, last hospitalization losartan and Aldactone were discontinued -TSH was within normal range, cortisol was low end of normal however patient is on prednisone -Urine sodium is less than 10, urine osmolarity is low at around 100, clinically appears euvolemic, suspect this is secondary to tea and toast diet -IV fluids discontinued, patient was put on fluid restriction, she admits to drinking large quantities of water throughout the day -Sodium improved to 128 which is her baseline, mentation has remained stable throughout  Right subcapital femur neck fracture -Noted on imaging yesterday evening, surprisingly most of her pain is in her lower back and tailbone -Orthopedics consulted, plan for right hip pain today -Updated son Shanon Brow yesterday evening -Will likely need short-term rehab thereafter, and DVT prophylaxis   Low back pain/L4-L5 disc herniation status post surgical decompression and stabilization with pedicle screw fixation on 5/29 and by Dr. Ellene Route  -Foley catheter placed 6/15 following ED visit for retention , this was changed 4 days ago  -Neurosurgery following, seen by Dr. Ellene Route today, supportive care recommended -MRI showed improvement in prior compression of thecal sac at L4-L5 with persistent lateral recess stenosis at L3-L4   Leukocytosis:  -Likely reactive, resolved   Possible UTI versus colonization  -Did have mild leukocytosis on admission however afebrile, no concrete symptoms suggestive of infection at this time, started on IV ceftriaxone on admission -Transition to oral Keflex to complete 3-day course -Foley catheter placed 6/15 last changed on 7/9, due to follow-up with urology this month  Prolonged QT interval: Acute on chronic.  QTC noted to be 540 on admission. -Given mag yesterday, QTC is improving, hold QT prolonging meds -Potassium corrected  Constipation: - Patient reports last bowel movement 3 to 4 days ago.  Patient on pain medications with tramadol at home with MiraLAX. -Continue MiraLAX and Senokot, add Dulcolax suppository  Hypokalemia: -Replaced  Macrocytic anemia:  -Improving, monitor, it was 8 g/dL last checked on 6/17.  Patient with elevated MCV and MCH, but vitamin B12 was 1298 and folate 34 when checked just last month.   Essential hypertension: Stable -Continue metoprolol, diltiazem, and hydralazine as tolerated  Rheumatoid arthritis on immunosuppressive therapy: Patient with seronegative rheumatoid arthritis currently on on long term prednisone -Continue prednisone and Plaquenil  COPD, without acute exacerbation: Stable. -Albuterol breathing treatments as needed  Chronic diastolic congestive heart failure: Patient appears to be euvolemic at this time.  Last echo from 09/2017 revealed EF 55-60 % with  grade 1 diastolic dysfunction. -Continue to  monitor, not on diuretics  Hyperlipidemia -Continue atorvastatin  Anxiety -Continue BuSpar and Klonopin as needed for anxiety  Osteoporosis -Continue Fosamax at discharge  DVT prophylaxis: Lovenox Code Status: Full Family Communication: No family present at bedside, called and updated son Shanon Brow 7/13 Disposition Plan:  SNF for rehab    Consultants:   NSG   Procedures:   Antimicrobials:    Subjective: -No BM yet, continues to have low back/tailbone pain, mild right hip discomfort  Objective: Vitals:   03/14/19 0546 03/14/19 1328 03/14/19 2331 03/15/19 0529  BP: 126/61 (!) 171/71 (!) 136/53 (!) 158/63  Pulse: 67 86 70 83  Resp: 10 19    Temp: 97.9 F (36.6 C) 97.8 F (36.6 C) 98.1 F (36.7 C) 98.6 F (37 C)  TempSrc:  Oral Oral Oral  SpO2: 98% 100% 95% 99%    Intake/Output Summary (Last 24 hours) at 03/15/2019 1250 Last data filed at 03/15/2019 0900 Gross per 24 hour  Intake 3 ml  Output 2100 ml  Net -2097 ml   There were no vitals filed for this visit.  Examination:  Gen: Elderly frail chronically ill female, awake, Alert, Oriented X 3,  HEENT: Erythema in the right periorbital area  lungs: Diminished breath sounds at both bases CVS: 1 S2/regular rate rhythm Abd: soft, Non tender, non distended, BS present Extremities: No edema Skin: Extensive bruises both arms and legs  psychiatry:. Mood & affect appropriate.     Data Reviewed:   CBC: Recent Labs  Lab 03/12/19 0403 03/13/19 0246 03/14/19 0319 03/15/19 0340  WBC 11.9* 8.1 7.5 6.7  NEUTROABS 9.9*  --   --   --   HGB 10.8* 9.8* 9.1* 8.8*  HCT 29.8* 26.8* 25.9* 24.9*  MCV 100.7* 101.5* 103.6* 103.3*  PLT 225 207 207 382   Basic Metabolic Panel: Recent Labs  Lab 03/13/19 0246 03/13/19 1050 03/13/19 1644 03/14/19 0319 03/14/19 1529 03/15/19 0340  NA 118* 118* 118* 122* 124* 128*  K 3.9 4.2 4.4 4.8 5.5* 4.9  CL 88* 89* 90* 92* 94* 97*  CO2 20* 18* 20* 20* 24 23  GLUCOSE 87  143* 259* 113* 126* 111*  BUN 14 14 17 23 22 23   CREATININE 1.03* 1.13* 1.25* 1.31* 1.33* 0.98  CALCIUM 8.1* 8.1* 8.1* 8.3* 8.7* 9.0  MG 2.2  --   --   --   --   --    GFR: CrCl cannot be calculated (Unknown ideal weight.). Liver Function Tests: No results for input(s): AST, ALT, ALKPHOS, BILITOT, PROT, ALBUMIN in the last 168 hours. No results for input(s): LIPASE, AMYLASE in the last 168 hours. No results for input(s): AMMONIA in the last 168 hours. Coagulation Profile: No results for input(s): INR, PROTIME in the last 168 hours. Cardiac Enzymes: No results for input(s): CKTOTAL, CKMB, CKMBINDEX, TROPONINI in the last 168 hours. BNP (last 3 results) No results for input(s): PROBNP in the last 8760 hours. HbA1C: No results for input(s): HGBA1C in the last 72 hours. CBG: No results for input(s): GLUCAP in the last 168 hours. Lipid Profile: No results for input(s): CHOL, HDL, LDLCALC, TRIG, CHOLHDL, LDLDIRECT in the last 72 hours. Thyroid Function Tests: No results for input(s): TSH, T4TOTAL, FREET4, T3FREE, THYROIDAB in the last 72 hours. Anemia Panel: No results for input(s): VITAMINB12, FOLATE, FERRITIN, TIBC, IRON, RETICCTPCT in the last 72 hours. Urine analysis:    Component Value Date/Time   COLORURINE YELLOW 03/12/2019 1148  APPEARANCEUR CLEAR 03/12/2019 1148   LABSPEC 1.003 (L) 03/12/2019 1148   PHURINE 7.0 03/12/2019 1148   GLUCOSEU NEGATIVE 03/12/2019 1148   HGBUR NEGATIVE 03/12/2019 1148   BILIRUBINUR NEGATIVE 03/12/2019 1148   KETONESUR NEGATIVE 03/12/2019 1148   PROTEINUR 30 (A) 03/12/2019 1148   UROBILINOGEN 0.2 09/22/2012 1100   NITRITE POSITIVE (A) 03/12/2019 1148   LEUKOCYTESUR MODERATE (A) 03/12/2019 1148   Sepsis Labs: @LABRCNTIP (procalcitonin:4,lacticidven:4)  ) Recent Results (from the past 240 hour(s))  SARS Coronavirus 2 (CEPHEID - Performed in [place] hospital lab), Hosp Order     Status: None   Collection Time: 03/12/19  6:17 AM    Specimen: Nasopharyngeal Swab  Result Value Ref Range Status   SARS Coronavirus 2 NEGATIVE NEGATIVE Final    Comment: (NOTE) If result is NEGATIVE SARS-CoV-2 target nucleic acids are NOT DETECTED. The SARS-CoV-2 RNA is generally detectable in upper and lower  respiratory specimens during the acute phase of infection. The lowest  concentration of SARS-CoV-2 viral copies this assay can detect is 250  copies / mL. A negative result does not preclude SARS-CoV-2 infection  and should not be used as the sole basis for treatment or other  patient management decisions.  A negative result may occur with  improper specimen collection / handling, submission of specimen other  than nasopharyngeal swab, presence of viral mutation(s) within the  areas targeted by this assay, and inadequate number of viral copies  (<250 copies / mL). A negative result must be combined with clinical  observations, patient history, and epidemiological information. If result is POSITIVE SARS-CoV-2 target nucleic acids are DETECTED. The SARS-CoV-2 RNA is generally detectable in upper and lower  respiratory specimens dur ing the acute phase of infection.  Positive  results are indicative of active infection with SARS-CoV-2.  Clinical  correlation with patient history and other diagnostic information is  necessary to determine patient infection status.  Positive results do  not rule out bacterial infection or co-infection with other viruses. If result is PRESUMPTIVE POSTIVE SARS-CoV-2 nucleic acids MAY BE PRESENT.   A presumptive positive result was obtained on the submitted specimen  and confirmed on repeat testing.  While 2019 novel coronavirus  (SARS-CoV-2) nucleic acids may be present in the submitted sample  additional confirmatory testing may be necessary for epidemiological  and / or clinical management purposes  to differentiate between  SARS-CoV-2 and other Sarbecovirus currently known to infect humans.  If  clinically indicated additional testing with an alternate test  methodology [phone]) is advised. The SARS-CoV-2 RNA is generally  detectable in upper and lower respiratory sp ecimens during the acute  phase of infection. The expected result is Negative. Fact Sheet for Patients:  StrictlyIdeas.no Fact Sheet for Healthcare Providers: BankingDealers.co.za This test is not yet approved or cleared by the [place] FDA and has been authorized for detection and/or diagnosis of SARS-CoV-2 by FDA under an Emergency Use Authorization (EUA).  This EUA will remain in effect (meaning this test can be used) for the duration of the COVID-19 declaration under Section 564(b)(1) of the Act, 21 U.S.C. section 360bbb-3(b)(1), unless the authorization is terminated or revoked sooner. Performed at [facility], [place] [address] [place], [place] [address]   Culture, Urine     Status: Abnormal   Collection Time: 03/12/19 11:42 AM   Specimen: Urine, Catheterized  Result Value Ref Range Status   Specimen Description URINE, CATHETERIZED  Final   Special Requests   Final  NONE Performed at [facility], [place] [address] [place], [place] [address]    Culture >=100,000 COLONIES/mL ESCHERICHIA COLI (A)  Final   Report Status 03/14/2019 FINAL  Final   Organism ID, Bacteria ESCHERICHIA COLI (A)  Final      Susceptibility   Escherichia coli - MIC*    AMPICILLIN <=2 SENSITIVE Sensitive     CEFAZOLIN <=4 SENSITIVE Sensitive     CEFTRIAXONE <=1 SENSITIVE Sensitive     CIPROFLOXACIN <=0.25 SENSITIVE Sensitive     GENTAMICIN <=1 SENSITIVE Sensitive     IMIPENEM <=0.25 SENSITIVE Sensitive     NITROFURANTOIN <=16 SENSITIVE Sensitive     TRIMETH/SULFA <=20 SENSITIVE Sensitive     AMPICILLIN/SULBACTAM <=2 SENSITIVE Sensitive     PIP/TAZO <=4 SENSITIVE Sensitive     Extended ESBL NEGATIVE Sensitive     * >=100,000 COLONIES/mL ESCHERICHIA COLI          Radiology Studies: Dg Pelvis 1-2 Views  Result Date: 03/13/2019 CLINICAL DATA:  Pelvic pain. Status post fall last night. Initial encounter. EXAM: PELVIS - 1-2 VIEW COMPARISON:  Single-view of the pelvis 03/12/2019. CT abdomen and pelvis 02/15/2019. FINDINGS: Possible right parasymphyseal pubic bone fracture seen on yesterday's examination is not visible today. No acute bony or joint abnormality is identified. The patient is status post L4-5 fusion. IMPRESSION: No acute abnormality. Possible right parasymphyseal pubic bone fracture on yesterday's examination is likely due to overlapping shadows. Electronically Signed   By: Inge Rise M.D.   On: 03/13/2019 14:08   Mr Sacrum Si Joints Wo Contrast  Result Date: 03/14/2019 CLINICAL DATA:  Recent fall. Tailbone pain. Evaluate for sacral insufficiency fracture. EXAM: MRI SACRUM WITHOUT CONTRAST TECHNIQUE: Multiplanar, multisequence MR imaging of the sacrum was performed. No intravenous contrast was administered. COMPARISON:  Pelvic x-rays from yesterday. CT abdomen pelvis dated February 15, 2019. FINDINGS: Bones/Joint/Cartilage Acute minimally impacted subcapital right femoral neck fracture. No sacral ala fracture. No dislocation. No joint effusion. Prior L4-L5 fusion. Muscles and Tendons Grossly intact.  No muscle edema or atrophy. Soft tissue No fluid collection or hematoma. No soft tissue mass. Foley catheter in the bladder. IMPRESSION: 1. Acute minimally impacted subcapital right femoral neck fracture. Consider dedicated right hip x-rays for further evaluation. 2. No additional acute osseous abnormality.  No sacral ala fracture. These results will be called to the ordering clinician or representative by the Radiologist Assistant, and communication documented in the PACS or zVision Dashboard. Electronically Signed   By: Titus Dubin M.D.   On: 03/14/2019 15:40   Dg Hip Unilat With Pelvis 2-3 Views Right  Result Date: 03/14/2019 CLINICAL DATA:  Fall  several days ago with known right hip fracture EXAM: DG HIP (WITH OR WITHOUT PELVIS) 3V RIGHT COMPARISON:  03/12/2019 FINDINGS: Impacted right femoral neck fracture is seen similar to that noted on prior MRI. Postsurgical changes in the lumbar spine are seen. No other fracture is noted. IMPRESSION: Mildly impacted right femoral neck fracture. No other focal abnormality is seen. Electronically Signed   By: Inez Catalina M.D.   On: 03/14/2019 18:13        Scheduled Meds: . [MAR Hold] atorvastatin  40 mg Oral Daily  . [MAR Hold] busPIRone  30 mg Oral BID  . [MAR Hold] carvedilol  6.25 mg Oral BID WC  . [MAR Hold] cephALEXin  250 mg Oral Q6H  . [MAR Hold] diltiazem  360 mg Oral Daily  . [MAR Hold] enoxaparin (LOVENOX) injection  40 mg Subcutaneous Q24H  . [  MAR Hold] feeding supplement  1 Container Oral TID BM  . [MAR Hold] gabapentin  300 mg Oral QHS  . [MAR Hold] hydrALAZINE  75 mg Oral TID  . [MAR Hold] hydroxychloroquine  200 mg Oral Daily  . [MAR Hold] lidocaine  1 patch Transdermal Q24H  . [MAR Hold] polyethylene glycol  17 g Oral Daily  . povidone-iodine  2 application Topical Once  . [MAR Hold] predniSONE  5 mg Oral Q breakfast  . [MAR Hold] senna-docusate  1 tablet Oral BID  . [MAR Hold] sodium chloride flush  3 mL Intravenous Q12H   Continuous Infusions: .  ceFAZolin (ANCEF) IV    . lactated ringers 10 mL/hr at 03/15/19 1124  . tranexamic acid       LOS: 1 day    Time spent:18min    Domenic Polite, MD [facility]  03/15/2019, 12:50 PM

## 2019-03-15 NOTE — Anesthesia Preprocedure Evaluation (Addendum)
Anesthesia Evaluation  Patient identified by MRN, date of birth, ID band Patient awake    Reviewed: Allergy & Precautions, H&P , NPO status , Patient's Chart, lab work & pertinent test results  Airway Mallampati: I  TM Distance: >3 FB Neck ROM: full    Dental  (+) Upper Dentures, Dental Advisory Given   Pulmonary COPD, Current Smoker,    breath sounds clear to auscultation       Cardiovascular hypertension,  Rhythm:regular Rate:Normal     Neuro/Psych  Headaches, PSYCHIATRIC DISORDERS Anxiety Depression  Neuromuscular disease    GI/Hepatic GERD  ,  Endo/Other    Renal/GU      Musculoskeletal  (+) Arthritis ,   Abdominal Normal abdominal exam  (+)   Peds  Hematology  (+) Blood dyscrasia, anemia ,   Anesthesia Other Findings   Reproductive/Obstetrics                            Anesthesia Physical Anesthesia Plan  ASA: III  Anesthesia Plan: General   Post-op Pain Management:    Induction: Intravenous  PONV Risk Score and Plan: 2 and Ondansetron, Dexamethasone, Midazolam and Treatment may vary due to age or medical condition  Airway Management Planned: Oral ETT  Additional Equipment:   Intra-op Plan:   Post-operative Plan: Extubation in OR  Informed Consent: I have reviewed the patients History and Physical, chart, labs and discussed the procedure including the risks, benefits and alternatives for the proposed anesthesia with the patient or authorized representative who has indicated his/her understanding and acceptance.     Dental advisory given  Plan Discussed with: Anesthesiologist, Surgeon and CRNA  Anesthesia Plan Comments:        Anesthesia Quick Evaluation

## 2019-03-15 NOTE — Op Note (Signed)
03/12/2019 - 03/15/2019  12:25 PM  PATIENT:  Regina Ortiz    PRE-OPERATIVE DIAGNOSIS:  neck fracture  POST-OPERATIVE DIAGNOSIS:  Same  PROCEDURE:  PINNING OF FEMORAL NECK FRACTURE  SURGEON:  Renette Butters, MD  ASSISTANT: Roxan Hockey, PA-C, he was present and scrubbed throughout the case, critical for completion in a timely fashion, and for retraction, instrumentation, and closure.   ANESTHESIA:   General  PREOPERATIVE INDICATIONS:  Regina Ortiz is a  73 y.o. female who fell and was found to have a diagnosis of neck fracture who elected for surgical management.    The risks benefits and alternatives were discussed with the patient preoperatively including but not limited to the risks of infection, bleeding, nerve injury, cardiopulmonary complications, blood clots, malunion, nonunion, avascular necrosis, the need for revision surgery, the potential for conversion to hemiarthroplasty, among others, and the patient was willing to proceed.  OPERATIVE IMPLANTS: 6.5 mm cannulated screws x3  OPERATIVE FINDINGS: Clinical osteoporosis with weak bone, proximal femur  OPERATIVE PROCEDURE: The patient was brought to the operating room and placed in supine position. IV antibiotics were given. General anesthesia administered. Foley was also given. The patient was placed on the fracture table. The operative extremity was positioned, without any significant reduction maneuver and was prepped and draped in usual sterile fashion.  Time out was performed.  Small incisions were made distal to the greater trochanter, and 3 guidewires were introduced Into an inverted triangle configuration. The lengths were measured. The reduction was slightly valgus, and near-anatomic. I opened the cortex with a cannulated drill, and then placed the screws into position. Satisfactory fixation was achieved. I sequentially tightened the screws by hand.  I performed a live fluoroscopic exam and no screw penetrance was  noted. All threads crossed the fracture site.   The wounds were irrigated copiously, and repaired with Vicryl with Steri-Strips and sterile gauze. There no complications and the patient tolerated the procedure well.  The patient will be weightbearing as tolerated, VTE prophylaxis will be: mobilization and chemical px.

## 2019-03-15 NOTE — Anesthesia Procedure Notes (Signed)
Procedure Name: Intubation Date/Time: 03/15/2019 12:39 PM Performed by: Scheryl Darter, CRNA Pre-anesthesia Checklist: Patient identified, Emergency Drugs available, Suction available and Patient being monitored Patient Re-evaluated:Patient Re-evaluated prior to induction Oxygen Delivery Method: Circle System Utilized Preoxygenation: Pre-oxygenation with 100% oxygen Induction Type: IV induction Ventilation: Mask ventilation without difficulty Laryngoscope Size: Miller and 2 Grade View: Grade I Tube type: Oral Tube size: 7.0 mm Number of attempts: 1 Airway Equipment and Method: Stylet and Oral airway Placement Confirmation: ETT inserted through vocal cords under direct vision,  positive ETCO2 and breath sounds checked- equal and bilateral Secured at: 21 cm Tube secured with: Tape Dental Injury: Teeth and Oropharynx as per pre-operative assessment

## 2019-03-15 NOTE — Progress Notes (Signed)
PT Cancellation Note  Patient Details Name: AMIAH FROHLICH MRN: [id number] DOB: May 03, 1946   Cancelled Treatment:    Reason Eval/Treat Not Completed: Medical issues which prohibited therapy, patient found to have femur fracture and is to have pinning performed today. Will need new PT order once surgery is complete and patient is appropriate for PT.     Zamyah Wiesman 03/15/2019, 11:05 AM

## 2019-03-16 ENCOUNTER — Encounter (HOSPITAL_COMMUNITY): Payer: Self-pay | Admitting: Orthopedic Surgery

## 2019-03-16 DIAGNOSIS — S72001D Fracture of unspecified part of neck of right femur, subsequent encounter for closed fracture with routine healing: Secondary | ICD-10-CM

## 2019-03-16 LAB — BASIC METABOLIC PANEL
Anion gap: 10 (ref 5–15)
BUN: 23 mg/dL (ref 8–23)
CO2: 22 mmol/L (ref 22–32)
Calcium: 9 mg/dL (ref 8.9–10.3)
Chloride: 92 mmol/L — ABNORMAL LOW (ref 98–111)
Creatinine, Ser: 1.04 mg/dL — ABNORMAL HIGH (ref 0.44–1.00)
GFR calc Af Amer: >60 mL/min (ref >60)
GFR calc non Af Amer: 53 mL/min — ABNORMAL LOW (ref >60)
Glucose, Bld: 158 mg/dL — ABNORMAL HIGH (ref 70–99)
Potassium: 4.7 mmol/L (ref 3.5–5.1)
Sodium: 124 mmol/L — ABNORMAL LOW (ref 135–145)

## 2019-03-16 LAB — CBC
HCT: 24 % — ABNORMAL LOW (ref 36.0–46.0)
Hemoglobin: 8.3 g/dL — ABNORMAL LOW (ref 12.0–15.0)
MCH: 36.4 pg — ABNORMAL HIGH (ref 26.0–34.0)
MCHC: 34.6 g/dL (ref 30.0–36.0)
MCV: 105.3 fL — ABNORMAL HIGH (ref 80.0–100.0)
Platelets: 233 10*3/uL (ref 150–400)
RBC: 2.28 MIL/uL — ABNORMAL LOW (ref 3.87–5.11)
RDW: 15.9 % — ABNORMAL HIGH (ref 11.5–15.5)
WBC: 8.6 10*3/uL (ref 4.0–10.5)
nRBC: 0 % (ref 0.0–0.2)

## 2019-03-16 MED ORDER — METHOCARBAMOL 500 MG PO TABS
500.0000 mg | ORAL_TABLET | Freq: Three times a day (TID) | ORAL | Status: DC
  Administered 2019-03-16 – 2019-03-17 (×5): 500 mg via ORAL
  Filled 2019-03-16 (×5): qty 1

## 2019-03-16 MED ORDER — FLEET ENEMA 7-19 GM/118ML RE ENEM
1.0000 | ENEMA | Freq: Once | RECTAL | Status: AC
  Administered 2019-03-16: 16:00:00 1 via RECTAL
  Filled 2019-03-16: qty 1

## 2019-03-16 MED ORDER — BISACODYL 10 MG RE SUPP
10.0000 mg | Freq: Once | RECTAL | Status: AC
  Administered 2019-03-16: 10 mg via RECTAL
  Filled 2019-03-16: qty 1

## 2019-03-16 MED ORDER — HYDROCODONE-ACETAMINOPHEN 5-325 MG PO TABS
1.0000 | ORAL_TABLET | Freq: Four times a day (QID) | ORAL | Status: DC | PRN
  Administered 2019-03-17 – 2019-03-18 (×2): 1 via ORAL
  Administered 2019-03-18 – 2019-03-19 (×4): 2 via ORAL
  Filled 2019-03-16 (×3): qty 2
  Filled 2019-03-16 (×2): qty 1
  Filled 2019-03-16: qty 2

## 2019-03-16 MED ORDER — CLONAZEPAM 0.5 MG PO TABS
0.5000 mg | ORAL_TABLET | Freq: Three times a day (TID) | ORAL | Status: DC | PRN
  Administered 2019-03-17 – 2019-03-19 (×6): 0.5 mg via ORAL
  Filled 2019-03-16 (×6): qty 1

## 2019-03-16 NOTE — Progress Notes (Signed)
Patient anxious and complaining of pain. Patient pain medication and antianxiety medication given and was reposition as requested. Pt reoriented and reassured that medication would be given when is due.  Will continue to monitor pt behavior.

## 2019-03-16 NOTE — NC FL2 (Signed)
[place] MEDICAID FL2 LEVEL OF CARE SCREENING TOOL     IDENTIFICATION  Patient Name: Regina Ortiz Birthdate: [birth year] Sex: female Admission Date (Current Location): 03/12/2019  [facility] [place] and [place] Number:  Herbalist and Address:  The [place], [place] [address], [place], [place] [address]      Provider Number: [id number]  Attending Physician Name and Address:  Kathie Dike, MD  Relative Name and Phone Number:  Arlee Muslim [facility]) [phone]    Current Level of Care: Hospital Recommended Level of Care: [place] Prior Approval Number:    Date Approved/Denied:   PASRR Number: [id number]  Discharge Plan: SNF    Current Diagnoses: Patient Active Problem List   Diagnosis Date Noted  . Pressure injury of skin 03/15/2019  . Fracture of femoral neck, right, closed ([place]) 03/14/2019  . Fall at home, initial encounter 03/12/2019  . Urinary retention 02/15/2019  . Lumbosacral radiculopathy 02/15/2019  . Constipation 02/15/2019  . Acute urinary retention 02/15/2019  . Cauda equina syndrome ([place]) 01/28/2019  . Chest pain 09/25/2017  . Hypertensive urgency 09/24/2017  . Headache 09/24/2017  . Alcohol use 09/24/2017  . AKI (acute kidney injury) ([place]) 09/24/2017  . Bilateral carotid artery stenosis   . Vulvovaginal candidiasis 10/22/2015  . Mild malnutrition ([place]) 07/07/2013  . Chronic bronchitis ([place]) 07/07/2013  . Tobacco abuse 07/07/2013  . Hypertension   . Short of breath on exertion   . Hemorrhoid   . LBBB (left bundle branch block)   . Anxiety   . Depression   . RA (rheumatoid arthritis) ([place])   . Venous insufficiency of leg   . Chronic hyponatremia   . AIN grade I 04/05/2013  . Diarrhea 09/22/2012  . Hyponatremia 09/22/2012  . Hypokalemia 09/22/2012  . ARF (acute renal failure) ([place]) 09/22/2012  . Atypical chest pain 06/22/2012  . Anemia 06/22/2012  . Vulvar intraepithelial neoplasia III (VIN III) 01/29/2012     Orientation RESPIRATION BLADDER Height & Weight     Self, Time, Situation, Place  Normal Indwelling catheter Weight:   Height:     BEHAVIORAL SYMPTOMS/MOOD NEUROLOGICAL BOWEL NUTRITION STATUS      Continent Diet  AMBULATORY STATUS COMMUNICATION OF NEEDS Skin   Extensive Assist Verbally Bruising(sacral wound, stage 2)                       Personal Care Assistance Level of Assistance  Bathing, Feeding, Dressing Bathing Assistance: Limited assistance Feeding assistance: Independent Dressing Assistance: Limited assistance     Functional Limitations Info             SPECIAL CARE FACTORS FREQUENCY  PT (By licensed PT), OT (By licensed OT)     PT Frequency: 3x/ week per PT's eval. 7/15. Please evaluate and treat. OT Frequency: Please evaluate and treat.            Contractures Contractures Info: Not present    Additional Factors Info  Code Status, Allergies Code Status Info: Full Code Allergies Info: Latex, Augmentin Amoxicillin-pot Clavulanate, Doxycycline, Lactose Intolerance , Lialda Mesalamine, Prinivil /Lisinopril, Triamcin olone Acetonide, Amlodipine, Lotensin/Benazepril Hcl           Current Medications (03/16/2019):  This is the current hospital active medication list Current Facility-Administered Medications  Medication Dose Route Frequency Provider Last Rate Last Dose  . acetaminophen (TYLENOL) tablet 325-650 mg  325-650 mg Oral Q6H PRN Prudencio Burly III, PA-C      .  albuterol (PROVENTIL) (2.5 MG/3ML) 0.083% nebulizer solution 2.5 mg  2.5 mg Nebulization Q6H PRN Prudencio Burly III, PA-C      . atorvastatin (LIPITOR) tablet 40 mg  40 mg Oral Daily Prudencio Burly III, PA-C   40 mg at 03/16/19 0944  . busPIRone (BUSPAR) tablet 30 mg  30 mg Oral BID Prudencio Burly III, PA-C   30 mg at 03/16/19 6659  . carvedilol (COREG) tablet 6.25 mg  6.25 mg Oral BID WC Prudencio Burly III, PA-C   6.25 mg at 03/16/19 0815   . cephALEXin (KEFLEX) capsule 250 mg  250 mg Oral Q6H Martensen, Charna Elizabeth III, PA-C   250 mg at 03/16/19 1116  . clonazePAM (KLONOPIN) tablet 0.5 mg  0.5 mg Oral BID PRN Prudencio Burly III, PA-C   0.5 mg at 03/15/19 1725  . diltiazem (CARDIZEM CD) 24 hr capsule 360 mg  360 mg Oral Daily Prudencio Burly III, PA-C   360 mg at 03/16/19 0945  . docusate sodium (COLACE) capsule 100 mg  100 mg Oral BID Prudencio Burly III, PA-C   100 mg at 03/16/19 0948  . enoxaparin (LOVENOX) injection 40 mg  40 mg Subcutaneous Q24H Prudencio Burly III, PA-C   40 mg at 03/16/19 1302  . feeding supplement (BOOST / RESOURCE BREEZE) liquid 1 Container  1 Container Oral TID BM Prudencio Burly III, PA-C   1 Container at 03/16/19 1455  . hydrALAZINE (APRESOLINE) tablet 75 mg  75 mg Oral TID Prudencio Burly III, PA-C   75 mg at 03/16/19 1506  . HYDROcodone-acetaminophen (NORCO/VICODIN) 5-325 MG per tablet 1-2 tablet  1-2 tablet Oral Q6H PRN Kathie Dike, MD      . HYDROmorphone (DILAUDID) injection 0.25-0.5 mg  0.25-0.5 mg Intravenous Q4H PRN Prudencio Burly III, PA-C   0.5 mg at 03/16/19 0954  . hydroxychloroquine (PLAQUENIL) tablet 200 mg  200 mg Oral Daily Prudencio Burly III, PA-C   200 mg at 03/16/19 9357  . lactated ringers infusion   Intravenous Continuous Prudencio Burly III, PA-C 10 mL/hr at 03/15/19 1124    . lidocaine (LIDODERM) 5 % 1 patch  1 patch Transdermal Q24H Prudencio Burly III, PA-C   1 patch at 03/16/19 1301  . methocarbamol (ROBAXIN) tablet 500 mg  500 mg Oral TID Kathie Dike, MD   500 mg at 03/16/19 1506  . polyethylene glycol (MIRALAX / GLYCOLAX) packet 17 g  17 g Oral Daily Prudencio Burly III, PA-C   17 g at 03/16/19 0177  . polyethylene glycol (MIRALAX / GLYCOLAX) packet 17 g  17 g Oral Daily PRN Prudencio Burly III, PA-C      . predniSONE (DELTASONE) tablet 5 mg  5 mg Oral Q breakfast  Prudencio Burly III, PA-C   5 mg at 03/16/19 0815  . senna-docusate (Senokot-S) tablet 1 tablet  1 tablet Oral BID Prudencio Burly III, PA-C   1 tablet at 03/16/19 9390  . sodium chloride flush (NS) 0.9 % injection 3 mL  3 mL Intravenous Q12H Prudencio Burly III, PA-C   3 mL at 03/16/19 3009     Discharge Medications: Please see discharge summary for a list of discharge medications.  Relevant Imaging Results:  Relevant Lab Results:   Additional Information SS#[id number]  [place], [place]

## 2019-03-16 NOTE — Progress Notes (Signed)
Patient ID: Regina Ortiz, female   DOB: 1945/10/08, 73 y.o.   MRN: [id number] Alert awake and anxious Reassured her that she is on a healing path Motor function is good  Post op pain may be an issue. Continue supportive care

## 2019-03-16 NOTE — Progress Notes (Signed)
    Subjective: Patient reports pain as moderate.  Anxious about medication regimen.  Not yet mobilized.  Objective:   VITALS:   Vitals:   03/15/19 1722 03/15/19 2139 03/16/19 0611 03/16/19 0816  BP:  118/69 (!) 162/76 (!) 181/77  Pulse:  74 81   Resp:      Temp: 97.8 F (36.6 C) 98.1 F (36.7 C) 98.5 F (36.9 C)   TempSrc:      SpO2:  96% 98% 100%   CBC Latest Ref Rng & Units 03/16/2019 03/15/2019 03/14/2019  WBC 4.0 - 10.5 K/uL 8.6 6.7 7.5  Hemoglobin 12.0 - 15.0 g/dL 8.3(L) 8.8(L) 9.1(L)  Hematocrit 36.0 - 46.0 % 24.0(L) 24.9(L) 25.9(L)  Platelets 150 - 400 K/uL 233 205 207   BMP Latest Ref Rng & Units 03/16/2019 03/15/2019 03/14/2019  Glucose 70 - 99 mg/dL 158(H) 111(H) 126(H)  BUN 8 - 23 mg/dL 23 23 22   Creatinine 0.44 - 1.00 mg/dL 1.04(H) 0.98 1.33(H)  Sodium 135 - 145 mmol/L 124(L) 128(L) 124(L)  Potassium 3.5 - 5.1 mmol/L 4.7 4.9 5.5(H)  Chloride 98 - 111 mmol/L 92(L) 97(L) 94(L)  CO2 22 - 32 mmol/L 22 23 24   Calcium 8.9 - 10.3 mg/dL 9.0 9.0 8.7(L)   Intake/Output      07/14 0701 - 07/15 0700 07/15 0701 - 07/16 0700   I.V. 156    IV Piggyback 450    Total Intake 606    Urine 1775    Stool     Blood 100    Total Output 1875    Net -1269            Physical Exam: General: NAD.  Supine in bed.  Conversant.  No increased work of breathing.  MSK RLE: Neurovascularly intact Sensation intact distally Feet warm Dorsiflexion/Plantar flexion intact Incision: dressing C/D/I   Assessment: 1 Day Post-Op  S/P Procedure(s) (LRB): PINNING OF FEMORAL NECK FRACTURE (Right) by Dr. Ernesta Amble. Murphy on 03/15/2019  Principal Problem:   Hyponatremia Active Problems:   Anemia   Hypokalemia   Hypertension   RA (rheumatoid arthritis) (H[facility])   Chronic hyponatremia   Lumbosacral radiculopathy   Constipation   Acute urinary retention   Fall at home, initial encounter   Fracture of femoral neck, right, closed ([place])   Pressure injury of skin   Closed right  femoral neck fracture, status post cannulated hip pinning Stable from an orthopedic perspective postop day 1 Not yet mobilized Pain improved some since surgery.  Plan: Up with therapy Incentive Spirometry Apply ice PRN  Weightbearing: WBAT RLE Insicional and dressing care: Dressings left intact until follow-up Showering: Keep dressing dry VTE prophylaxis: Lovenox 40mg  qd 30 days, SCDs, ambulation Pain control: Minimize narcotics.  Tylenol.  Norco for severe pain. Follow - up plan: 2 weeks Contact information:  Edmonia Lynch MD, Roxan Hockey PA-C  Dispo: TBD.  Therapy evaluations pending.  Discharge when mobilized and ready medically.   Prudencio Burly III, PA-C 03/16/2019, 8:38 AM

## 2019-03-16 NOTE — Progress Notes (Signed)
This nurse in to talk to patient. Pt is stating that she is very anxious and requesting a number of things. This nurse able to calm patient and let her know that at this time nothing is due for her and I reassured her that when it is due, I will bring it in patient is more calm at this time.

## 2019-03-16 NOTE — Evaluation (Signed)
Physical Therapy Evaluation Patient Details Name: Regina Ortiz MRN: [id number] DOB: 1946/02/16 Today's Date: 03/16/2019   History of Present Illness  73 year old female with history of lumbar spinal stenosis, cauda equina underwent L4-L5 decompression and fusion by Dr. Ellene Route on 5/29, subsequently discharged to rehab on 6/4 and and then home after a week was seen in the emergency room on 6/15 with urinary retention she was discharged home with a Foley catheter, with plans for urology follow-up, lives by herself, requires assistance in a lot of ADLs,-Readmitted 7/11 with worsening low back pain and left lower extremity pain following a fall which was mechanical. -In the emergency room she was noted to have sodium of 118, creatinine of 0.9, initial imaging did not note any injuries.-Patient continued to complain of tailbone pain and pelvic discomfort, subsequently had an MRI of her sacrum 7/13 which surprisingly showed right subcapital femur neck fracture, orthopedics consulted. now s/p femoral neck pinning on 7/14  Clinical Impression  Pt presents with deficits in strength and mobility and will benefit from skilled PT services to address deficits and improve functional mobility. Pt very anxious throughout eval and significant time spent providing encouragement and attempting relaxation techniques with pt.  RN aware of anxiety.  Pt attempts to sit up in recliner but is unable to stay up in chair due to Lt hip pain.  Pt will benefit from 24 hour supervision at home or at SNF if this cannot be provided by caregiver.    Follow Up Recommendations Supervision/Assistance - 24 hour;Home health PT;SNF    Equipment Recommendations  None recommended by PT    Recommendations for Other Services       Precautions / Restrictions Precautions Precautions: Fall Restrictions Weight Bearing Restrictions: No      Mobility  Bed Mobility     Rolling: Supervision Sidelying to sit: Supervision     Sit to  sidelying: Min assist General bed mobility comments: min A for LEs  Transfers   Equipment used: Rolling walker (2 wheeled) Transfers: Sit to/from [facility]   Stand pivot transfers: Supervision       General transfer comment: cuing for deep breathing  Ambulation/Gait Ambulation/Gait assistance: Min assist Gait Distance (Feet): 3 Feet Assistive device: Rolling walker (2 wheeled) Gait Pattern/deviations: Step-to pattern;Decreased step length - right;Decreased step length - left        Stairs            Wheelchair Mobility    Modified Rankin (Stroke Patients Only)       Balance                                             Pertinent Vitals/Pain Faces Pain Scale: Hurts whole lot Pain Descriptors / Indicators: Discomfort;Grimacing;Guarding Pain Intervention(s): Premedicated before session;Monitored during session;Repositioned;Relaxation;Limited activity within patient's tolerance    Home Living Family/patient expects to be discharged to:: Private residence Living Arrangements: Alone Available Help at Discharge: Personal care attendant;Available PRN/intermittently Type of Home: House Home Access: Stairs to enter Entrance Stairs-Rails: Chemical engineer of Steps: 4   Home Equipment: Environmental consultant - 2 wheels;Bedside commode Additional Comments: pt states caregiver is there 9-4    Prior Function     Gait / Transfers Assistance Needed: ambulates with RW limited household levels            Hand Dominance  Extremity/Trunk Assessment   Upper Extremity Assessment Upper Extremity Assessment: Generalized weakness    Lower Extremity Assessment Lower Extremity Assessment: Generalized weakness LLE Sensation: decreased light touch    Cervical / Trunk Assessment Cervical / Trunk Exceptions: post op lumbar fusion  Communication      Cognition Arousal/Alertness: Awake/alert Behavior During Therapy:  Anxious                                   General Comments: pt with very anxious behaviours.  asks to not be left alone.  keeps stating "This is all too much"      General Comments      Exercises     Assessment/Plan    PT Assessment Patient needs continued PT services  PT Problem List Decreased strength;Decreased activity tolerance;Decreased balance;Decreased mobility;Decreased knowledge of use of DME;Decreased knowledge of precautions;Pain;Impaired sensation       PT Treatment Interventions DME instruction;Gait training;Stair training;Functional mobility training;Therapeutic exercise;Therapeutic activities;Balance training;Neuromuscular re-education;Patient/family education;Modalities    PT Goals (Current goals can be found in the Care Plan section)  Acute Rehab PT Goals Patient Stated Goal: to go home PT Goal Formulation: With patient Time For Goal Achievement: 03/26/19 Potential to Achieve Goals: Good    Frequency Min 4X/week   Barriers to discharge        Co-evaluation               AM-PAC PT "6 Clicks" Mobility  Outcome Measure Help needed turning from your back to your side while in a flat bed without using bedrails?: A Little Help needed moving from lying on your back to sitting on the side of a flat bed without using bedrails?: A Little Help needed moving to and from a bed to a chair (including a wheelchair)?: A Little Help needed standing up from a chair using your arms (e.g., wheelchair or bedside chair)?: A Little Help needed to walk in hospital room?: A Little Help needed climbing 3-5 steps with a railing? : Total 6 Click Score: 16    End of Session Equipment Utilized During Treatment: Gait belt Activity Tolerance: Patient limited by pain Patient left: in bed;with call bell/phone within reach;with nursing/sitter in room   PT Visit Diagnosis: Other abnormalities of gait and mobility (R26.89);Muscle weakness (generalized)  (M62.81);Difficulty in walking, not elsewhere classified (R26.2) Pain - Right/Left: Left Pain - part of body: Leg    Time: 6468-0321 PT Time Calculation (min) (ACUTE ONLY): 38 min   Charges:   PT Evaluation $PT Eval Moderate Complexity: 1 Mod PT Treatments $Self Care/Home Management: 23-37       Isabelle Course, PT, DPT  Mccartney Chuba 03/16/2019, 10:46 AM

## 2019-03-16 NOTE — NC FL2 (Deleted)
[place] MEDICAID FL2 LEVEL OF CARE SCREENING TOOL     IDENTIFICATION  Patient Name: Regina Ortiz Birthdate: April 01, 1946 Sex: female Admission Date (Current Location): 03/12/2019  [facility] and [place] Number:  Herbalist and Address:  The . [facility], [place] [address], [place], [place] [address]      Provider Number: [id number]  Attending Physician Name and Address:  Kathie Dike, MD  Relative Name and Phone Number:  Arlee Muslim [facility]) [phone]    Current Level of Care: SNF Recommended Level of Care: [place] Prior Approval Number:    Date Approved/Denied:   PASRR Number: [id number]  Discharge Plan: SNF    Current Diagnoses: Patient Active Problem List   Diagnosis Date Noted  . Pressure injury of skin 03/15/2019  . Fracture of femoral neck, right, closed ([place]) 03/14/2019  . Fall at home, initial encounter 03/12/2019  . Urinary retention 02/15/2019  . Lumbosacral radiculopathy 02/15/2019  . Constipation 02/15/2019  . Acute urinary retention 02/15/2019  . Cauda equina syndrome ([place]) 01/28/2019  . Chest pain 09/25/2017  . Hypertensive urgency 09/24/2017  . Headache 09/24/2017  . Alcohol use 09/24/2017  . AKI (acute kidney injury) ([place]) 09/24/2017  . Bilateral carotid artery stenosis   . Vulvovaginal candidiasis 10/22/2015  . Mild malnutrition ([place]) 07/07/2013  . Chronic bronchitis ([place]) 07/07/2013  . Tobacco abuse 07/07/2013  . Hypertension   . Short of breath on exertion   . Hemorrhoid   . LBBB (left bundle branch block)   . Anxiety   . Depression   . RA (rheumatoid arthritis) ([place])   . Venous insufficiency of leg   . Chronic hyponatremia   . AIN grade I 04/05/2013  . Diarrhea 09/22/2012  . Hyponatremia 09/22/2012  . Hypokalemia 09/22/2012  . ARF (acute renal failure) ([place]) 09/22/2012  . Atypical chest pain 06/22/2012  . Anemia 06/22/2012  . Vulvar intraepithelial neoplasia III (VIN III) 01/29/2012     Orientation RESPIRATION BLADDER Height & Weight     Self, Time, Situation, Place  Normal Indwelling catheter Weight:   Height:     BEHAVIORAL SYMPTOMS/MOOD NEUROLOGICAL BOWEL NUTRITION STATUS      Continent Diet  AMBULATORY STATUS COMMUNICATION OF NEEDS Skin   Extensive Assist Verbally Bruising(sacral wound, stage 2)                       Personal Care Assistance Level of Assistance  Bathing, Feeding, Dressing Bathing Assistance: Limited assistance Feeding assistance: Independent Dressing Assistance: Limited assistance     Functional Limitations Info             SPECIAL CARE FACTORS FREQUENCY  PT (By licensed PT), OT (By licensed OT)     PT Frequency: 3x/ week per PT's eval. 7/15. Please evaluate and treat. OT Frequency: Please evaluate and treat.            Contractures Contractures Info: Not present    Additional Factors Info  Code Status, Allergies Code Status Info: Full Code Allergies Info: Latex, Augmentin Amoxicillin-pot Clavulanate, Doxycycline, Lactose Intolerance , Lialda Mesalamine, Prinivil /Lisinopril, Triamcin olone Acetonide, Amlodipine, Lotensin/Benazepril Hcl           Current Medications (03/16/2019):  This is the current hospital active medication list Current Facility-Administered Medications  Medication Dose Route Frequency Provider Last Rate Last Dose  . acetaminophen (TYLENOL) tablet 325-650 mg  325-650 mg Oral Q6H PRN Prudencio Burly III, PA-C      .  albuterol (PROVENTIL) (2.5 MG/3ML) 0.083% nebulizer solution 2.5 mg  2.5 mg Nebulization Q6H PRN Prudencio Burly III, PA-C      . atorvastatin (LIPITOR) tablet 40 mg  40 mg Oral Daily Prudencio Burly III, PA-C   40 mg at 03/16/19 0944  . busPIRone (BUSPAR) tablet 30 mg  30 mg Oral BID Prudencio Burly III, PA-C   30 mg at 03/16/19 4627  . carvedilol (COREG) tablet 6.25 mg  6.25 mg Oral BID WC Prudencio Burly III, PA-C   6.25 mg at 03/16/19 0815   . cephALEXin (KEFLEX) capsule 250 mg  250 mg Oral Q6H Martensen, Charna Elizabeth III, PA-C   250 mg at 03/16/19 1116  . clonazePAM (KLONOPIN) tablet 0.5 mg  0.5 mg Oral BID PRN Prudencio Burly III, PA-C   0.5 mg at 03/15/19 1725  . diltiazem (CARDIZEM CD) 24 hr capsule 360 mg  360 mg Oral Daily Prudencio Burly III, PA-C   360 mg at 03/16/19 0945  . docusate sodium (COLACE) capsule 100 mg  100 mg Oral BID Prudencio Burly III, PA-C   100 mg at 03/16/19 0948  . enoxaparin (LOVENOX) injection 40 mg  40 mg Subcutaneous Q24H Prudencio Burly III, PA-C   40 mg at 03/16/19 1302  . feeding supplement (BOOST / RESOURCE BREEZE) liquid 1 Container  1 Container Oral TID BM Prudencio Burly III, PA-C   1 Container at 03/16/19 1003  . hydrALAZINE (APRESOLINE) tablet 75 mg  75 mg Oral TID Prudencio Burly III, PA-C   75 mg at 03/16/19 0953  . HYDROcodone-acetaminophen (NORCO/VICODIN) 5-325 MG per tablet 1-2 tablet  1-2 tablet Oral Q6H PRN Kathie Dike, MD      . HYDROmorphone (DILAUDID) injection 0.25-0.5 mg  0.25-0.5 mg Intravenous Q4H PRN Prudencio Burly III, PA-C   0.5 mg at 03/16/19 0954  . hydroxychloroquine (PLAQUENIL) tablet 200 mg  200 mg Oral Daily Prudencio Burly III, PA-C   200 mg at 03/16/19 0350  . lactated ringers infusion   Intravenous Continuous Prudencio Burly III, PA-C 10 mL/hr at 03/15/19 1124    . lidocaine (LIDODERM) 5 % 1 patch  1 patch Transdermal Q24H Prudencio Burly III, PA-C   1 patch at 03/16/19 1301  . methocarbamol (ROBAXIN) tablet 500 mg  500 mg Oral TID Kathie Dike, MD   500 mg at 03/16/19 1045  . polyethylene glycol (MIRALAX / GLYCOLAX) packet 17 g  17 g Oral Daily Prudencio Burly III, PA-C   17 g at 03/16/19 0938  . polyethylene glycol (MIRALAX / GLYCOLAX) packet 17 g  17 g Oral Daily PRN Prudencio Burly III, PA-C      . predniSONE (DELTASONE) tablet 5 mg  5 mg Oral Q breakfast  Prudencio Burly III, PA-C   5 mg at 03/16/19 0815  . senna-docusate (Senokot-S) tablet 1 tablet  1 tablet Oral BID Prudencio Burly III, PA-C   1 tablet at 03/16/19 1829  . sodium chloride flush (NS) 0.9 % injection 3 mL  3 mL Intravenous Q12H Prudencio Burly III, PA-C   3 mL at 03/16/19 9371     Discharge Medications: Please see discharge summary for a list of discharge medications.  Relevant Imaging Results:  Relevant Lab Results:   Additional Information SS#[id number]  [place], [place]

## 2019-03-16 NOTE — Anesthesia Postprocedure Evaluation (Signed)
Anesthesia Post Note  Patient: Regina Ortiz  Procedure(s) Performed: PINNING OF FEMORAL NECK FRACTURE (Right Hip)     Patient location during evaluation: PACU Anesthesia Type: General Level of consciousness: awake and alert Pain management: pain level controlled Vital Signs Assessment: post-procedure vital signs reviewed and stable Respiratory status: spontaneous breathing, nonlabored ventilation, respiratory function stable and patient connected to nasal cannula oxygen Cardiovascular status: blood pressure returned to baseline and stable Postop Assessment: no apparent nausea or vomiting Anesthetic complications: no    Last Vitals:  Vitals:   03/16/19 0611 03/16/19 0816  BP: (!) 162/76 (!) 181/77  Pulse: 81   Resp:    Temp: 36.9 C   SpO2: 98% 100%    Last Pain:  Vitals:   03/16/19 0816  TempSrc:   PainSc: 7                  Efren Kross S

## 2019-03-16 NOTE — Progress Notes (Signed)
OT Cancellation Note  Patient Details Name: Regina Ortiz MRN: [id number] DOB: 01/20/1946   Cancelled Treatment:    Reason Eval/Treat Not Completed: Patient declined, no reason specified;Other (comment)("I'm just so anxious and I hurt." Pt appears restless and anxious, constantly arranging and rearranging items around her). Plan to reattempt tomorrow.  Tyrone Schimke, OT Acute Rehabilitation Services Pager: [phone] Office: [phone]  03/16/2019, 12:52 PM

## 2019-03-16 NOTE — Progress Notes (Signed)
RNCM received consult for possible SNF placement at time of discharge. RNCM spoke with patient regarding PT recommendation of SNF placement at time of discharge. Patient reported that she is currently unable to care for self at  home given her current physical needs and fall risk. Patient expressed understanding of PT recommendation and is agreeable to SNF placement at time of discharge. Patient reports she doesn't want [facility]. RNCM discussed insurance authorization process and provided Medicare SNF ratings list. Patient expressed being hopeful for rehab and to feel better soon. No further questions reported at this time. RNCM to continue to follow and assist with discharge planning needs. Whitman Hero RN,BSN,CM [phone]

## 2019-03-16 NOTE — Progress Notes (Addendum)
PROGRESS NOTE    Regina Ortiz  PTW:[id number] DOB: 12-23-1945 DOA: 03/12/2019 PCP: Maurice Small, MD  Brief Narrative: 73 year old female with history of lumbar spinal stenosis, cauda equina underwent L4-L5 decompression and fusion by Dr. Ellene Route on 5/29, subsequently discharged to rehab on 6/4 and and then home after a week was seen in the emergency room on 6/15 with urinary retention she was discharged home with a Foley catheter, with plans for urology follow-up, lives by herself, requires assistance in a lot of ADLs, -Readmitted 7/11 with worsening low back pain and left lower extremity pain following a fall which was mechanical.  -In the emergency room she was noted to have sodium of 118, creatinine of 0.9, initial imaging did not note any injuries. -Patient continued to complain of tailbone pain and pelvic discomfort, subsequently had an MRI of her sacrum 7/13 which surprisingly showed right subcapital femur neck fracture, orthopedics consulted  Assessment & Plan:     Hyponatremia -Acute on chronic -Has baseline hyponatremia with sodium in the 125 range, admission sodium was 118, last hospitalization losartan and Aldactone were discontinued -TSH was within normal range, cortisol was low end of normal however patient is on prednisone -Urine sodium is less than 10, urine osmolarity is low at around 100, clinically appears euvolemic, suspect this is secondary to tea and toast diet -IV fluids discontinued, patient was put on fluid restriction, she admits to drinking large quantities of water throughout the day -Sodium improved to 128 which is her baseline, mentation has remained stable throughout  Right subcapital femur neck fracture -Noted on imaging, surprisingly most of her pain is in her lower back and tailbone -Orthopedics consulted and patient underwent operative management on 7/14 -Will likely need short-term rehab thereafter, and DVT prophylaxis  Low back pain/L4-L5 disc herniation  status post surgical decompression and stabilization with pedicle screw fixation on 5/29 and by Dr. Ellene Route  -Foley catheter placed 6/15 following ED visit for retention , this was changed 4 days ago  -Neurosurgery following, seen by Dr. Ellene Route today, supportive care recommended -MRI showed improvement in prior compression of thecal sac at L4-L5 with persistent lateral recess stenosis at L3-L4   Leukocytosis:  -Likely reactive, resolved   Possible UTI versus colonization  -Did have mild leukocytosis on admission however afebrile, no concrete symptoms suggestive of infection at this time, started on IV ceftriaxone on admission -Transition to oral Keflex to complete 3-day course -Foley catheter placed 6/15 last changed on 7/9, due to follow-up with urology this month  Prolonged QT interval: Acute on chronic.  QTC noted to be 540 on admission. -Given mag yesterday, QTC is improving, hold QT prolonging meds -Potassium corrected  Constipation: - Patient on pain medications with tramadol at home with MiraLAX. -Continue MiraLAX and Senokot, add Dulcolax suppository -Since she is still not had a bowel movement provide fleets enema  Hypokalemia: -Replaced  Macrocytic anemia:  -Improving, monitor, it was 8 g/dL last checked on 6/17.  Patient with elevated MCV and MCH, but vitamin B12 was 1298 and folate 34 when checked just last month.   Essential hypertension: Stable -Continue metoprolol, diltiazem, and hydralazine as tolerated  Rheumatoid arthritis on immunosuppressive therapy: Patient with seronegative rheumatoid arthritis currently on on long term prednisone -Continue prednisone and Plaquenil  COPD, without acute exacerbation: Stable. -Albuterol breathing treatments as needed  Chronic diastolic congestive heart failure: Patient appears to be euvolemic at this time.  Last echo from 09/2017 revealed EF 55-60 % with grade 1 diastolic dysfunction. -Continue  to monitor, not on  diuretics  Hyperlipidemia -Continue atorvastatin  Anxiety -Continue BuSpar and Klonopin as needed for anxiety  Osteoporosis -Continue Fosamax at discharge  DVT prophylaxis: Lovenox Code Status: Full Family Communication: No family present at bedside, called and left voicemail for patient's son 7/15 Disposition Plan:  SNF for rehab    Consultants:   NSG  Orthopedics   Procedures:   Pinning of right femoral neck fracture  Antimicrobials:    Subjective: -Patient is very anxious, complains of pain in her back, lower legs bilaterally.  She has not had a bowel movement in 10 days.  Objective: Vitals:   03/16/19 0945 03/16/19 1404 03/16/19 1506 03/16/19 1724  BP: (!) 148/73 138/72 (!) 169/71 (!) 165/64  Pulse:  88  86  Resp:  19    Temp:  98.1 F (36.7 C)    TempSrc:  Oral    SpO2:        Intake/Output Summary (Last 24 hours) at 03/16/2019 2008 Last data filed at 03/16/2019 1837 Gross per 24 hour  Intake 844 ml  Output 1751 ml  Net -907 ml   There were no vitals filed for this visit.  Examination:  Gen: Elderly frail chronically ill female, awake, Alert, Oriented X 3,  HEENT: Erythema in the right periorbital area  lungs: Diminished breath sounds at both bases CVS: 1 S2/regular rate rhythm Abd: soft, Non tender, non distended, BS present Extremities: No edema Skin: Extensive bruises both arms and legs  psychiatry:. Mood & affect appropriate.     Data Reviewed:   CBC: Recent Labs  Lab 03/12/19 0403 03/13/19 0246 03/14/19 0319 03/15/19 0340 03/16/19 0322  WBC 11.9* 8.1 7.5 6.7 8.6  NEUTROABS 9.9*  --   --   --   --   HGB 10.8* 9.8* 9.1* 8.8* 8.3*  HCT 29.8* 26.8* 25.9* 24.9* 24.0*  MCV 100.7* 101.5* 103.6* 103.3* 105.3*  PLT 225 207 207 205 973   Basic Metabolic Panel: Recent Labs  Lab 03/13/19 0246  03/13/19 1644 03/14/19 0319 03/14/19 1529 03/15/19 0340 03/16/19 0322  NA 118*   < > 118* 122* 124* 128* 124*  K 3.9   < > 4.4 4.8  5.5* 4.9 4.7  CL 88*   < > 90* 92* 94* 97* 92*  CO2 20*   < > 20* 20* 24 23 22   GLUCOSE 87   < > 259* 113* 126* 111* 158*  BUN 14   < > 17 23 22 23 23   CREATININE 1.03*   < > 1.25* 1.31* 1.33* 0.98 1.04*  CALCIUM 8.1*   < > 8.1* 8.3* 8.7* 9.0 9.0  MG 2.2  --   --   --   --   --   --    < > = values in this interval not displayed.   GFR: CrCl cannot be calculated (Unknown ideal weight.). Liver Function Tests: No results for input(s): AST, ALT, ALKPHOS, BILITOT, PROT, ALBUMIN in the last 168 hours. No results for input(s): LIPASE, AMYLASE in the last 168 hours. No results for input(s): AMMONIA in the last 168 hours. Coagulation Profile: No results for input(s): INR, PROTIME in the last 168 hours. Cardiac Enzymes: No results for input(s): CKTOTAL, CKMB, CKMBINDEX, TROPONINI in the last 168 hours. BNP (last 3 results) No results for input(s): PROBNP in the last 8760 hours. HbA1C: No results for input(s): HGBA1C in the last 72 hours. CBG: No results for input(s): GLUCAP in the last 168 hours. Lipid Profile:  No results for input(s): CHOL, HDL, LDLCALC, TRIG, CHOLHDL, LDLDIRECT in the last 72 hours. Thyroid Function Tests: No results for input(s): TSH, T4TOTAL, FREET4, T3FREE, THYROIDAB in the last 72 hours. Anemia Panel: No results for input(s): VITAMINB12, FOLATE, FERRITIN, TIBC, IRON, RETICCTPCT in the last 72 hours. Urine analysis:    Component Value Date/Time   COLORURINE YELLOW 03/12/2019 1148   APPEARANCEUR CLEAR 03/12/2019 1148   LABSPEC 1.003 (L) 03/12/2019 1148   PHURINE 7.0 03/12/2019 1148   GLUCOSEU NEGATIVE 03/12/2019 1148   HGBUR NEGATIVE 03/12/2019 1148   BILIRUBINUR NEGATIVE 03/12/2019 1148   KETONESUR NEGATIVE 03/12/2019 1148   PROTEINUR 30 (A) 03/12/2019 1148   UROBILINOGEN 0.2 09/22/2012 1100   NITRITE POSITIVE (A) 03/12/2019 1148   LEUKOCYTESUR MODERATE (A) 03/12/2019 1148   Sepsis Labs: @LABRCNTIP (procalcitonin:4,lacticidven:4)  ) Recent Results (from  the past 240 hour(s))  SARS Coronavirus 2 (CEPHEID - Performed in [facility] hospital lab), Hosp Order     Status: None   Collection Time: 03/12/19  6:17 AM   Specimen: Nasopharyngeal Swab  Result Value Ref Range Status   SARS Coronavirus 2 NEGATIVE NEGATIVE Final    Comment: (NOTE) If result is NEGATIVE SARS-CoV-2 target nucleic acids are NOT DETECTED. The SARS-CoV-2 RNA is generally detectable in upper and lower  respiratory specimens during the acute phase of infection. The lowest  concentration of SARS-CoV-2 viral copies this assay can detect is 250  copies / mL. A negative result does not preclude SARS-CoV-2 infection  and should not be used as the sole basis for treatment or other  patient management decisions.  A negative result may occur with  improper specimen collection / handling, submission of specimen other  than nasopharyngeal swab, presence of viral mutation(s) within the  areas targeted by this assay, and inadequate number of viral copies  (<250 copies / mL). A negative result must be combined with clinical  observations, patient history, and epidemiological information. If result is POSITIVE SARS-CoV-2 target nucleic acids are DETECTED. The SARS-CoV-2 RNA is generally detectable in upper and lower  respiratory specimens dur ing the acute phase of infection.  Positive  results are indicative of active infection with SARS-CoV-2.  Clinical  correlation with patient history and other diagnostic information is  necessary to determine patient infection status.  Positive results do  not rule out bacterial infection or co-infection with other viruses. If result is PRESUMPTIVE POSTIVE SARS-CoV-2 nucleic acids MAY BE PRESENT.   A presumptive positive result was obtained on the submitted specimen  and confirmed on repeat testing.  While 2019 novel coronavirus  (SARS-CoV-2) nucleic acids may be present in the submitted sample  additional confirmatory testing may be necessary  for epidemiological  and / or clinical management purposes  to differentiate between  SARS-CoV-2 and other Sarbecovirus currently known to infect humans.  If clinically indicated additional testing with an alternate test  methodology [id number]) is advised. The SARS-CoV-2 RNA is generally  detectable in upper and lower respiratory sp ecimens during the acute  phase of infection. The expected result is Negative. Fact Sheet for Patients:  StrictlyIdeas.no Fact Sheet for Healthcare Providers: BankingDealers.co.za This test is not yet approved or cleared by the [place] FDA and has been authorized for detection and/or diagnosis of SARS-CoV-2 by FDA under an Emergency Use Authorization (EUA).  This EUA will remain in effect (meaning this test can be used) for the duration of the COVID-19 declaration under Section 564(b)(1) of the Act, 21 U.S.C. section 360bbb-3(b)(1), unless the  authorization is terminated or revoked sooner. Performed at [facility], [place] [address], [place], [place] [address]   Culture, Urine     Status: Abnormal   Collection Time: 03/12/19 11:42 AM   Specimen: Urine, Catheterized  Result Value Ref Range Status   Specimen Description URINE, CATHETERIZED  Final   Special Requests   Final    NONE Performed at [facility], [place] [address] [place], [place] [address]    Culture >=100,000 COLONIES/mL ESCHERICHIA COLI (A)  Final   Report Status 03/14/2019 FINAL  Final   Organism ID, Bacteria ESCHERICHIA COLI (A)  Final      Susceptibility   Escherichia coli - MIC*    AMPICILLIN <=2 SENSITIVE Sensitive     CEFAZOLIN <=4 SENSITIVE Sensitive     CEFTRIAXONE <=1 SENSITIVE Sensitive     CIPROFLOXACIN <=0.25 SENSITIVE Sensitive     GENTAMICIN <=1 SENSITIVE Sensitive     IMIPENEM <=0.25 SENSITIVE Sensitive     NITROFURANTOIN <=16 SENSITIVE Sensitive     TRIMETH/SULFA <=20 SENSITIVE Sensitive      AMPICILLIN/SULBACTAM <=2 SENSITIVE Sensitive     PIP/TAZO <=4 SENSITIVE Sensitive     Extended ESBL NEGATIVE Sensitive     * >=100,000 COLONIES/mL ESCHERICHIA COLI         Radiology Studies: Dg C-arm 1-60 Min  Result Date: 03/15/2019 CLINICAL DATA:  RIGHT hip pinning. EXAM: DG C-ARM 61-120 MIN COMPARISON:  None. FINDINGS: Intraoperative fluoroscopic images demonstrate placement of 3 fixation screws traversing the RIGHT femoral neck. Fluoroscopy provided for 41 seconds. IMPRESSION: Fluoroscopy provided for 41 seconds. Electronically Signed   By: Franki Cabot M.D.   On: 03/15/2019 14:00   Dg Hip Operative Unilat W Or W/o Pelvis Right  Result Date: 03/15/2019 CLINICAL DATA:  Right hip pinning EXAM: OPERATIVE right HIP (WITH PELVIS IF PERFORMED) for VIEWS TECHNIQUE: Fluoroscopic spot image(s) were submitted for interpretation post-operatively. COMPARISON:  03/14/2019 FINDINGS: Images show placement of 3 Knowles type pins for fixation of a femoral neck fracture. No radiographically detectable complication. IMPRESSION: Knowles pin placement for fixation of a right femoral neck fracture. Electronically Signed   By: Nelson Chimes M.D.   On: 03/15/2019 15:34        Scheduled Meds: . atorvastatin  40 mg Oral Daily  . busPIRone  30 mg Oral BID  . carvedilol  6.25 mg Oral BID WC  . cephALEXin  250 mg Oral Q6H  . diltiazem  360 mg Oral Daily  . docusate sodium  100 mg Oral BID  . enoxaparin (LOVENOX) injection  40 mg Subcutaneous Q24H  . feeding supplement  1 Container Oral TID BM  . hydrALAZINE  75 mg Oral TID  . hydroxychloroquine  200 mg Oral Daily  . lidocaine  1 patch Transdermal Q24H  . methocarbamol  500 mg Oral TID  . polyethylene glycol  17 g Oral Daily  . predniSONE  5 mg Oral Q breakfast  . senna-docusate  1 tablet Oral BID  . sodium chloride flush  3 mL Intravenous Q12H   Continuous Infusions: . lactated ringers 10 mL/hr at 03/15/19 1124     LOS: 2 days    Time  spent:77min    Kathie Dike, MD [facility]  03/16/2019, 8:08 PM

## 2019-03-17 LAB — BASIC METABOLIC PANEL
Anion gap: 13 (ref 5–15)
BUN: 25 mg/dL — ABNORMAL HIGH (ref 8–23)
CO2: 21 mmol/L — ABNORMAL LOW (ref 22–32)
Calcium: 9 mg/dL (ref 8.9–10.3)
Chloride: 92 mmol/L — ABNORMAL LOW (ref 98–111)
Creatinine, Ser: 0.99 mg/dL (ref 0.44–1.00)
GFR calc Af Amer: >60 mL/min (ref >60)
GFR calc non Af Amer: 56 mL/min — ABNORMAL LOW (ref >60)
Glucose, Bld: 111 mg/dL — ABNORMAL HIGH (ref 70–99)
Potassium: 4.6 mmol/L (ref 3.5–5.1)
Sodium: 126 mmol/L — ABNORMAL LOW (ref 135–145)

## 2019-03-17 LAB — CBC
HCT: 21.5 % — ABNORMAL LOW (ref 36.0–46.0)
Hemoglobin: 7.6 g/dL — ABNORMAL LOW (ref 12.0–15.0)
MCH: 36.2 pg — ABNORMAL HIGH (ref 26.0–34.0)
MCHC: 35.3 g/dL (ref 30.0–36.0)
MCV: 102.4 fL — ABNORMAL HIGH (ref 80.0–100.0)
Platelets: 220 10*3/uL (ref 150–400)
RBC: 2.1 MIL/uL — ABNORMAL LOW (ref 3.87–5.11)
RDW: 15.4 % (ref 11.5–15.5)
WBC: 8 10*3/uL (ref 4.0–10.5)
nRBC: 0 % (ref 0.0–0.2)

## 2019-03-17 MED ORDER — METHOCARBAMOL 500 MG PO TABS
500.0000 mg | ORAL_TABLET | Freq: Three times a day (TID) | ORAL | Status: DC | PRN
  Administered 2019-03-19 (×2): 500 mg via ORAL
  Filled 2019-03-17 (×3): qty 1

## 2019-03-17 NOTE — Progress Notes (Addendum)
Left voicemail message to son Shanon Brow. Awaiting a callback to provide an update.  1510: Update provided.

## 2019-03-17 NOTE — Progress Notes (Signed)
    Subjective: Patient reports pain as moderate.  Anxious about being able to walk and getting in touch with her son.    Objective:   VITALS:   Vitals:   03/16/19 1724 03/16/19 2220 03/17/19 0559 03/17/19 0602  BP: (!) 165/64 (!) 169/70 (!) 155/72   Pulse: 86 86 91   Resp:  18 18   Temp:  98.3 F (36.8 C) 99.2 F (37.3 C)   TempSrc:   Oral   SpO2:  99% 97%   Weight:    54.9 kg   CBC Latest Ref Rng & Units 03/17/2019 03/16/2019 03/15/2019  WBC 4.0 - 10.5 K/uL 8.0 8.6 6.7  Hemoglobin 12.0 - 15.0 g/dL 7.6(L) 8.3(L) 8.8(L)  Hematocrit 36.0 - 46.0 % 21.5(L) 24.0(L) 24.9(L)  Platelets 150 - 400 K/uL 220 233 205   BMP Latest Ref Rng & Units 03/17/2019 03/16/2019 03/15/2019  Glucose 70 - 99 mg/dL 111(H) 158(H) 111(H)  BUN 8 - 23 mg/dL 25(H) 23 23  Creatinine 0.44 - 1.00 mg/dL 0.99 1.04(H) 0.98  Sodium 135 - 145 mmol/L 126(L) 124(L) 128(L)  Potassium 3.5 - 5.1 mmol/L 4.6 4.7 4.9  Chloride 98 - 111 mmol/L 92(L) 92(L) 97(L)  CO2 22 - 32 mmol/L 21(L) 22 23  Calcium 8.9 - 10.3 mg/dL 9.0 9.0 9.0   Intake/Output      07/15 0701 - 07/16 0700 07/16 0701 - 07/17 0700   P.O. 544    I.V. (mL/kg)     IV Piggyback     Total Intake(mL/kg) 544 (9.9)    Urine (mL/kg/hr) 3100 (2.4)    Stool 2    Blood     Total Output 3102    Net -2558         Stool Occurrence 1 x       Physical Exam: General: NAD.  Supine in bed asleep on arrival.  No increased work of breathing.  Confused on awaking, but re-oriented and responds appropriately to questions.  MSK RLE: Neurovascularly intact Sensation intact distally Feet warm Dorsiflexion/Plantar flexion intact Incision: dressing C/D/I   Assessment: 2 Days Post-Op  S/P Procedure(s) (LRB): PINNING OF FEMORAL NECK FRACTURE (Right) by Dr. Ernesta Amble. Percell Miller on 03/15/2019  Principal Problem:   Hyponatremia Active Problems:   Anemia   Hypokalemia   Hypertension   RA (rheumatoid arthritis) (HCC)   Chronic hyponatremia   Lumbosacral  radiculopathy   Constipation   Acute urinary retention   Fall at home, initial encounter   Fracture of femoral neck, right, closed ([place])   Pressure injury of skin   Closed right femoral neck fracture, status post cannulated hip pinning Stable from an orthopedic perspective postop day 2 Slow early mobilization - SNF recommended.     Plan: Continue to mobilize Incentive Spirometry Apply ice PRN  Weightbearing: WBAT RLE Insicional and dressing care: Dressings left intact until follow-up Showering: Keep dressing dry VTE prophylaxis: Lovenox 40mg  qd 30 days, SCDs, ambulation Pain control: Minimize narcotics.  Tylenol.  Norco for severe pain. Follow - up plan: 2 weeks Contact information:  Edmonia Lynch MD, Roxan Hockey PA-C  Dispo: Therapy evaluations ongoing - SNF recommended.  Discharge when ready medically.   Charna Elizabeth Martensen III, PA-C 03/17/2019, 7:05 AM

## 2019-03-17 NOTE — Progress Notes (Signed)
03/17/19 1327  PT Visit Information  Last PT Received On 03/17/19  Assistance Needed +1  History of Present Illness 73 year old female with history of lumbar spinal stenosis, cauda equina underwent L4-L5 decompression and fusion by Dr. Ellene Route on 5/29, subsequently discharged to rehab on 6/4 and and then home after a week was seen in the emergency room on 6/15 with urinary retention she was discharged home with a Foley catheter, with plans for urology follow-up, lives by herself, requires assistance in a lot of ADLs,-Readmitted 7/11 with worsening low back pain and left lower extremity pain following a fall which was mechanical. -In the emergency room she was noted to have sodium of 118, creatinine of 0.9, initial imaging did not note any injuries.-Patient continued to complain of tailbone pain and pelvic discomfort, subsequently had an MRI of her sacrum 7/13 which surprisingly showed right subcapital femur neck fracture, orthopedics consulted. now s/p femoral neck pinning on 7/14  Subjective Data  Patient Stated Goal to get comfortable  Precautions  Precautions Fall  Restrictions  Weight Bearing Restrictions Yes  RLE Weight Bearing WBAT  Pain Assessment  Pain Assessment Faces  Faces Pain Scale 6  Pain Location RLE   Pain Descriptors / Indicators Discomfort;Grimacing;Guarding  Pain Intervention(s) Limited activity within patient's tolerance;Monitored during session;Repositioned  Cognition  Arousal/Alertness Awake/alert  Behavior During Therapy Anxious  Overall Cognitive Status Impaired/Different from baseline  Area of Impairment Problem solving;Safety/judgement;Memory  Memory Decreased short-term memory  Safety/Judgement Decreased awareness of safety  Problem Solving Slow processing  General Comments Anxious throughout session. complained of RW not working and that PT would not be able to catch her if she fell.   Bed Mobility  Overal bed mobility Needs Assistance  Bed Mobility Supine  to Sit;Sit to Supine  Supine to sit Supervision  Sit to sidelying Min assist  General bed mobility comments Min A for RLE assist to return to supine.   Transfers  Overall transfer level Needs assistance  Equipment used Rolling walker (2 wheeled)  Transfers Sit to/from Stand  Sit to Stand Min assist  General transfer comment Min A for lift assist and steadying to stand. Cues for safe hand placement.   Ambulation/Gait  Ambulation/Gait assistance Min assist  Gait Distance (Feet) 5 Feet (X2)  Assistive device Rolling walker (2 wheeled)  Gait Pattern/deviations Step-to pattern;Decreased step length - right;Decreased step length - left  General Gait Details Antalgic gait. Pt very anxious and kept repeating that RW was not working correctly and that PT would not be able to catch pt if she fell. Educated that PT was there to guard pt if she needed. Further gait deferred secondary to anxiety.   Gait velocity Decreased   Balance  Overall balance assessment Needs assistance  Sitting-balance support Feet supported;No upper extremity supported  Sitting balance-Leahy Scale Fair  Standing balance support Bilateral upper extremity supported;During functional activity  Standing balance-Leahy Scale Poor  Standing balance comment Reliant on BUE support   Exercises  Exercises Total Joint  Total Joint Exercises  Ankle Circles/Pumps AROM;Both;20 reps;Supine  Quad Sets AROM;Both;10 reps;Supine  PT - End of Session  Equipment Utilized During Treatment Gait belt  Activity Tolerance Patient limited by pain  Patient left in bed;with call bell/phone within reach;with bed alarm set;Other (comment) (with OT in room )  Nurse Communication Mobility status   PT - Assessment/Plan  PT Plan Current plan remains appropriate  PT Visit Diagnosis Other abnormalities of gait and mobility (R26.89);Muscle weakness (generalized) (M62.81);Difficulty in walking, not  elsewhere classified (R26.2)  Pain - Right/Left Right   Pain - part of body Leg  PT Frequency (ACUTE ONLY) Min 3X/week  Recommendations for Other Services OT consult  Follow Up Recommendations Supervision/Assistance - 24 hour;SNF  PT equipment None recommended by PT  AM-PAC PT "6 Clicks" Mobility Outcome Measure (Version 2)  Help needed turning from your back to your side while in a flat bed without using bedrails? 3  Help needed moving from lying on your back to sitting on the side of a flat bed without using bedrails? 3  Help needed moving to and from a bed to a chair (including a wheelchair)? 3  Help needed standing up from a chair using your arms (e.g., wheelchair or bedside chair)? 3  Help needed to walk in hospital room? 3  Help needed climbing 3-5 steps with a railing?  1  6 Click Score 16  Consider Recommendation of Discharge To: Home with [facility]  PT Goal Progression  Progress towards PT goals Progressing toward goals  Acute Rehab PT Goals  PT Goal Formulation With patient  Time For Goal Achievement 03/26/19  Potential to Achieve Goals Good  PT Time Calculation  PT Start Time (ACUTE ONLY) 1041  PT Stop Time (ACUTE ONLY) 1105  PT Time Calculation (min) (ACUTE ONLY) 24 min  PT General Charges  $$ ACUTE PT VISIT 1 Visit  PT Treatments  $Gait Training 8-22 mins  $Therapeutic Activity 8-22 mins   Pt with slow progression towards goals. Continues to be very anxious with all mobility tasks. Complained that RW was not working correctly throughout and was very fearful of falling. Required min A with use of RW to perform short distance ambulation. Feel pt will require SNF level therapies at d/c. Will continue to follow acutely to maximize functional mobility independence and safety.   Leighton Ruff, PT, DPT  Acute Rehabilitation Services  Pager: [phone] Office: [phone]

## 2019-03-17 NOTE — Progress Notes (Signed)
Occupational Therapy Treatment Patient Details Name: Regina Ortiz MRN: [id number] DOB: 24-Jul-1946 Today's Date: 03/17/2019    History of present illness 73 year old female with history of lumbar spinal stenosis, cauda equina underwent L4-L5 decompression and fusion by Dr. Ellene Route on 5/29, subsequently discharged to rehab on 6/4 and and then home after a week was seen in the emergency room on 6/15 with urinary retention she was discharged home with a Foley catheter, with plans for urology follow-up, lives by herself, requires assistance in a lot of ADLs,-Readmitted 7/11 with worsening low back pain and left lower extremity pain following a fall which was mechanical. -In the emergency room she was noted to have sodium of 118, creatinine of 0.9, initial imaging did not note any injuries.-Patient continued to complain of tailbone pain and pelvic discomfort, subsequently had an MRI of her sacrum 7/13 which surprisingly showed right subcapital femur neck fracture, orthopedics consulted. now s/p femoral neck pinning on 7/14   OT comments  Pt presents s/p femoral neck pinning on 7/14. Pt progressing toward established goals. Pt currently requires minguard-minA for ADL and minguard-minA for functional mobility at RW level. Pt continues to appear anxious throughout session, educated pt on relaxation techniques. RN aware. Pt will continue to benefit from skilled OT services to maximize safety and independence with ADL/IADL and functional mobility. Will continue to follow acutely and progress as tolerated. Continue to recommend SNF level therapies prior to returning home.      Follow Up Recommendations  SNF    Equipment Recommendations  None recommended by OT    Recommendations for Other Services      Precautions / Restrictions Precautions Precautions: Fall Restrictions Weight Bearing Restrictions: Yes Other Position/Activity Restrictions: RLE WBAT       Mobility Bed Mobility Overal bed mobility:  Needs Assistance Bed Mobility: Sidelying to Sit;Sit to Sidelying Rolling: Supervision Sidelying to sit: Supervision     Sit to sidelying: Min assist General bed mobility comments: min A for LEs  Transfers Overall transfer level: Needs assistance Equipment used: Rolling walker (2 wheeled) Transfers: Sit to/from Stand Sit to Stand: Min assist Stand pivot transfers: Supervision       General transfer comment: cueing for safe hand placement    Balance Overall balance assessment: Needs assistance Sitting-balance support: Feet supported;No upper extremity supported Sitting balance-Leahy Scale: Fair     Standing balance support: Single extremity supported;During functional activity Standing balance-Leahy Scale: Poor Standing balance comment: requires UE support                           ADL either performed or assessed with clinical judgement   ADL Overall ADL's : Needs assistance/impaired Eating/Feeding: Sitting;Supervision/ safety   Grooming: Min guard;Standing;Oral care;Wash/dry face;Minimal assistance Grooming Details (indicate cue type and reason): pt completed at sink level, minguard progressing to minA;decreased activity tolerance Upper Body Bathing: Minimal assistance;Sitting   Lower Body Bathing: Sit to/from stand;Moderate assistance   Upper Body Dressing : Minimal assistance;Sitting   Lower Body Dressing: Minimal assistance;Sit to/from stand Lower Body Dressing Details (indicate cue type and reason): pt able to adjust socks while sitting EOB Toilet Transfer: Minimal assistance;RW;Ambulation Toilet Transfer Details (indicate cue type and reason): simulated to/from EOB with in room mobiltiy Toileting- Clothing Manipulation and Hygiene: Minimal assistance;Sit to/from stand       Functional mobility during ADLs: Rolling walker;Min guard;Minimal assistance General ADL Comments: minguard progressing to minA with functional mobiltiy due to decreased  activity tolerance  Vision Baseline Vision/History: Wears glasses Wears Glasses: At all times Patient Visual Report: No change from baseline Vision Assessment?: No apparent visual deficits   Perception     Praxis      Cognition Arousal/Alertness: Awake/alert Behavior During Therapy: Anxious Overall Cognitive Status: Impaired/Different from baseline Area of Impairment: Problem solving;Safety/judgement;Memory                     Memory: Decreased short-term memory   Safety/Judgement: Decreased awareness of safety   Problem Solving: Slow processing General Comments: pt appeared axious, frequently complaining about RW not working correctly, due to sticky wheels        Exercises     Shoulder Instructions       General Comments HR 104 with grooming at sink level;    Pertinent Vitals/ Pain       Pain Assessment: 0-10 Pain Score: 6  Faces Pain Scale: Hurts even more Pain Location: back Pain Descriptors / Indicators: Discomfort;Grimacing;Guarding Pain Intervention(s): Limited activity within patient's tolerance;Monitored during session  Home Living Family/patient expects to be discharged to:: Private residence Living Arrangements: Alone Available Help at Discharge: Personal care attendant;Available PRN/intermittently Type of Home: House Home Access: Stairs to enter [employer] of Steps: 4 Entrance Stairs-Rails: Left;Right Home Layout: Two level;Able to live on main level with bedroom/bathroom Alternate Level Stairs-Number of Steps: flight Alternate Level Stairs-Rails: Right Bathroom Shower/Tub: Tub/shower unit   Bathroom Toilet: Standard     Home Equipment: Environmental consultant - 2 wheels;Bedside commode   Additional Comments: pt states caregiver is there 9-4      Prior Functioning/Environment Level of Independence: Needs assistance  Gait / Transfers Assistance Needed: ambulates with RW limited household levels  ADL's / Homemaking Assistance Needed:  HHA assists with bathing and dressing and performs iADLs. Takes sponge baths.   Comments: sponge bathes, used RW when she got home from SNF, but has not since.   Frequency  Min 2X/week        Progress Toward Goals  OT Goals(current goals can now be found in the care plan section)  Progress towards OT goals: Progressing toward goals  Acute Rehab OT Goals Patient Stated Goal: to go home OT Goal Formulation: With patient Time For Goal Achievement: 03/27/19 Potential to Achieve Goals: Good ADL Goals Pt Will Perform Upper Body Bathing: with supervision;sitting Pt Will Perform Lower Body Bathing: with supervision;with adaptive equipment;sit to/from stand Pt Will Perform Upper Body Dressing: with supervision;sitting Pt Will Perform Lower Body Dressing: with min assist;with adaptive equipment;sit to/from stand Pt Will Transfer to Toilet: with supervision;ambulating;bedside commode Pt Will Perform Toileting - Clothing Manipulation and hygiene: with supervision;sit to/from stand  Plan Discharge plan remains appropriate    Co-evaluation                 AM-PAC OT "6 Clicks" Daily Activity     Outcome Measure   Help from another person eating meals?: None Help from another person taking care of personal grooming?: None(seated) Help from another person toileting, which includes using toliet, bedpan, or urinal?: A Little Help from another person bathing (including washing, rinsing, drying)?: A Lot Help from another person to put on and taking off regular upper body clothing?: A Little Help from another person to put on and taking off regular lower body clothing?: A Lot 6 Click Score: 18    End of Session Equipment Utilized During Treatment: Gait belt;Rolling walker  OT Visit Diagnosis: Unsteadiness on feet (R26.81);Muscle weakness (generalized) (M62.81);History of falling (Z91.81);Pain  Pain - Right/Left: Right Pain - part of body: Leg(back)   Activity Tolerance Patient  tolerated treatment well   Patient Left in bed;with call bell/phone within reach;with bed alarm set   Nurse Communication Mobility status        Time: 0981-1914 OT Time Calculation (min): 21 min  Charges: OT General Charges $OT Visit: 1 Visit OT Treatments $Self Care/Home Management : 8-22 mins  Dorinda Hill OTR/L Acute Rehabilitation Services Office: [place] 03/17/2019, 11:55 AM

## 2019-03-17 NOTE — Progress Notes (Signed)
NCM informed by [employer] rep. [facility] medical review denied for SNF placement. Insurance offering peer to peer with hospital MD, NCM made hospital MD aware. Hospital MD agreed to do peer to peer. NCM provided Hospital MD with # to call for peer to peer, [phone] (Dr. Coralie Carpen). Whitman Hero RN,BSN,CM [phone]
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
## 2019-03-17 NOTE — Progress Notes (Signed)
PROGRESS NOTE    Regina Ortiz  TGG:[id number] DOB: 03/07/1946 DOA: 03/12/2019 PCP: Maurice Small, MD  Brief Narrative: 73 year old female with history of lumbar spinal stenosis, cauda equina underwent L4-L5 decompression and fusion by Dr. Ellene Route on 5/29, subsequently discharged to rehab on 6/4 and and then home after a week was seen in the emergency room on 6/15 with urinary retention she was discharged home with a Foley catheter, with plans for urology follow-up, lives by herself, requires assistance in a lot of ADLs, -Readmitted 7/11 with worsening low back pain and left lower extremity pain following a fall which was mechanical.  -In the emergency room she was noted to have sodium of 118, creatinine of 0.9, initial imaging did not note any injuries. -Patient continued to complain of tailbone pain and pelvic discomfort, subsequently had an MRI of her sacrum 7/13 which surprisingly showed right subcapital femur neck fracture, orthopedics consulted  Assessment & Plan:     Hyponatremia -Acute on chronic -Has baseline hyponatremia with sodium in the 125 range, admission sodium was 118, last hospitalization losartan and Aldactone were discontinued -TSH was within normal range, cortisol was low end of normal however patient is on prednisone -Urine sodium is less than 10, urine osmolarity is low at around 100, clinically appears euvolemic, suspect this is secondary to tea and toast diet -IV fluids discontinued, patient was put on fluid restriction, she admits to drinking large quantities of water throughout the day -Sodium improved to 128 which is her baseline, mentation has remained stable throughout  Right subcapital femur neck fracture -Noted on imaging, surprisingly most of her pain is in her lower back and tailbone -Orthopedics consulted and patient underwent operative management on 7/14 -Will likely need short-term rehab thereafter, and DVT prophylaxis  Low back pain/L4-L5 disc herniation  status post surgical decompression and stabilization with pedicle screw fixation on 5/29 and by Dr. Ellene Route  -Foley catheter placed 6/15 following ED visit for retention , this was changed 4 days ago  -Neurosurgery following, seen by Dr. Ellene Route today, supportive care recommended -MRI showed improvement in prior compression of thecal sac at L4-L5 with persistent lateral recess stenosis at L3-L4   Leukocytosis:  -Likely reactive, resolved   Possible UTI versus colonization  -Did have mild leukocytosis on admission however afebrile, no concrete symptoms suggestive of infection at this time, started on IV ceftriaxone on admission -Transitioned to oral Keflex and completed 3-day course -Foley catheter placed 6/15 last changed on 7/9, due to follow-up with urology this month  Prolonged QT interval: Acute on chronic.  QTC noted to be 540 on admission. -Given mag yesterday, QTC is improving, hold QT prolonging meds -Potassium corrected  Constipation: - Resolved after receiving enema.  Currently has rectal tube due to persistent stools.  Will change MiraLAX to as needed.  Hypokalemia: -Replaced  Macrocytic anemia:  -Improving, monitor, it was 8 g/dL last checked on 6/17.  Patient with elevated MCV and MCH, but vitamin B12 was 1298 and folate 34 when checked just last month.   Essential hypertension: Stable -Continue metoprolol, diltiazem, and hydralazine as tolerated  Rheumatoid arthritis on immunosuppressive therapy: Patient with seronegative rheumatoid arthritis currently on on long term prednisone -Continue prednisone and Plaquenil  COPD, without acute exacerbation: Stable. -Albuterol breathing treatments as needed  Chronic diastolic congestive heart failure: Patient appears to be euvolemic at this time.  Last echo from 09/2017 revealed EF 55-60 % with grade 1 diastolic dysfunction. -Continue to monitor, not on diuretics  Hyperlipidemia -Continue atorvastatin  Anxiety  -Continue BuSpar and Klonopin as needed for anxiety  Osteoporosis -Continue Fosamax at discharge  DVT prophylaxis: Lovenox Code Status: Full Family Communication: No family present at bedside, called and left voicemail for patient's son 7/15 Disposition Plan:  SNF for rehab    Consultants:   NSG  Orthopedics   Procedures:   Pinning of right femoral neck fracture  Antimicrobials:    Subjective: Patient began having bowel movements yesterday after enema.  She had a rectal tube placed overnight.  Feels that anxiety is better today.  Worked with physical therapy today.  Objective: Vitals:   03/16/19 2220 03/17/19 0559 03/17/19 0602 03/17/19 1304  BP: (!) 169/70 (!) 155/72  (!) 160/61  Pulse: 86 91  82  Resp: 18 18  19   Temp: 98.3 F (36.8 C) 99.2 F (37.3 C)  98.4 F (36.9 C)  TempSrc:  Oral  Oral  SpO2: 99% 97%  99%  Weight:   54.9 kg     Intake/Output Summary (Last 24 hours) at 03/17/2019 1952 Last data filed at 03/17/2019 1800 Gross per 24 hour  Intake 480 ml  Output 1901 ml  Net -1421 ml   Filed Weights   03/17/19 0602  Weight: 54.9 kg    Examination:  General exam: Alert, awake, oriented x 3 Respiratory system: Clear to auscultation. Respiratory effort normal. Cardiovascular system:RRR. No murmurs, rubs, gallops. Gastrointestinal system: Abdomen is nondistended, soft and nontender. No organomegaly or masses felt. Normal bowel sounds heard. Central nervous system: Alert and oriented. No focal neurological deficits. Extremities: No C/C/E, +pedal pulses Skin: No rashes, lesions or ulcers Psychiatry: still mildly anxious, but better than yesterday.      Data Reviewed:   CBC: Recent Labs  Lab 03/12/19 0403 03/13/19 0246 03/14/19 0319 03/15/19 0340 03/16/19 0322 03/17/19 0332  WBC 11.9* 8.1 7.5 6.7 8.6 8.0  NEUTROABS 9.9*  --   --   --   --   --   HGB 10.8* 9.8* 9.1* 8.8* 8.3* 7.6*  HCT 29.8* 26.8* 25.9* 24.9* 24.0* 21.5*  MCV 100.7*  101.5* 103.6* 103.3* 105.3* 102.4*  PLT 225 207 207 205 233 563   Basic Metabolic Panel: Recent Labs  Lab 03/13/19 0246  03/14/19 0319 03/14/19 1529 03/15/19 0340 03/16/19 0322 03/17/19 0332  NA 118*   < > 122* 124* 128* 124* 126*  K 3.9   < > 4.8 5.5* 4.9 4.7 4.6  CL 88*   < > 92* 94* 97* 92* 92*  CO2 20*   < > 20* 24 23 22  21*  GLUCOSE 87   < > 113* 126* 111* 158* 111*  BUN 14   < > 23 22 23 23  25*  CREATININE 1.03*   < > 1.31* 1.33* 0.98 1.04* 0.99  CALCIUM 8.1*   < > 8.3* 8.7* 9.0 9.0 9.0  MG 2.2  --   --   --   --   --   --    < > = values in this interval not displayed.   GFR: Estimated Creatinine Clearance: 43.1 mL/min (by C-G formula based on SCr of 0.99 mg/dL). Liver Function Tests: No results for input(s): AST, ALT, ALKPHOS, BILITOT, PROT, ALBUMIN in the last 168 hours. No results for input(s): LIPASE, AMYLASE in the last 168 hours. No results for input(s): AMMONIA in the last 168 hours. Coagulation Profile: No results for input(s): INR, PROTIME in the last 168 hours. Cardiac Enzymes: No results for input(s): CKTOTAL, CKMB, CKMBINDEX, TROPONINI in the  last 168 hours. BNP (last 3 results) No results for input(s): PROBNP in the last 8760 hours. HbA1C: No results for input(s): HGBA1C in the last 72 hours. CBG: No results for input(s): GLUCAP in the last 168 hours. Lipid Profile: No results for input(s): CHOL, HDL, LDLCALC, TRIG, CHOLHDL, LDLDIRECT in the last 72 hours. Thyroid Function Tests: No results for input(s): TSH, T4TOTAL, FREET4, T3FREE, THYROIDAB in the last 72 hours. Anemia Panel: No results for input(s): VITAMINB12, FOLATE, FERRITIN, TIBC, IRON, RETICCTPCT in the last 72 hours. Urine analysis:    Component Value Date/Time   COLORURINE YELLOW 03/12/2019 1148   APPEARANCEUR CLEAR 03/12/2019 1148   LABSPEC 1.003 (L) 03/12/2019 1148   PHURINE 7.0 03/12/2019 1148   GLUCOSEU NEGATIVE 03/12/2019 1148   HGBUR NEGATIVE 03/12/2019 1148   BILIRUBINUR  NEGATIVE 03/12/2019 1148   KETONESUR NEGATIVE 03/12/2019 1148   PROTEINUR 30 (A) 03/12/2019 1148   UROBILINOGEN 0.2 09/22/2012 1100   NITRITE POSITIVE (A) 03/12/2019 1148   LEUKOCYTESUR MODERATE (A) 03/12/2019 1148   Sepsis Labs: @LABRCNTIP (procalcitonin:4,lacticidven:4)  ) Recent Results (from the past 240 hour(s))  SARS Coronavirus 2 (CEPHEID - Performed in [place] hospital lab), Hosp Order     Status: None   Collection Time: 03/12/19  6:17 AM   Specimen: Nasopharyngeal Swab  Result Value Ref Range Status   SARS Coronavirus 2 NEGATIVE NEGATIVE Final    Comment: (NOTE) If result is NEGATIVE SARS-CoV-2 target nucleic acids are NOT DETECTED. The SARS-CoV-2 RNA is generally detectable in upper and lower  respiratory specimens during the acute phase of infection. The lowest  concentration of SARS-CoV-2 viral copies this assay can detect is 250  copies / mL. A negative result does not preclude SARS-CoV-2 infection  and should not be used as the sole basis for treatment or other  patient management decisions.  A negative result may occur with  improper specimen collection / handling, submission of specimen other  than nasopharyngeal swab, presence of viral mutation(s) within the  areas targeted by this assay, and inadequate number of viral copies  (<250 copies / mL). A negative result must be combined with clinical  observations, patient history, and epidemiological information. If result is POSITIVE SARS-CoV-2 target nucleic acids are DETECTED. The SARS-CoV-2 RNA is generally detectable in upper and lower  respiratory specimens dur ing the acute phase of infection.  Positive  results are indicative of active infection with SARS-CoV-2.  Clinical  correlation with patient history and other diagnostic information is  necessary to determine patient infection status.  Positive results do  not rule out bacterial infection or co-infection with other viruses. If result is PRESUMPTIVE  POSTIVE SARS-CoV-2 nucleic acids MAY BE PRESENT.   A presumptive positive result was obtained on the submitted specimen  and confirmed on repeat testing.  While 2019 novel coronavirus  (SARS-CoV-2) nucleic acids may be present in the submitted sample  additional confirmatory testing may be necessary for epidemiological  and / or clinical management purposes  to differentiate between  SARS-CoV-2 and other Sarbecovirus currently known to infect humans.  If clinically indicated additional testing with an alternate test  methodology [phone]) is advised. The SARS-CoV-2 RNA is generally  detectable in upper and lower respiratory sp ecimens during the acute  phase of infection. The expected result is Negative. Fact Sheet for Patients:  StrictlyIdeas.no Fact Sheet for Healthcare Providers: BankingDealers.co.za This test is not yet approved or cleared by the [place] FDA and has been authorized for detection and/or diagnosis of  SARS-CoV-2 by FDA under an Emergency Use Authorization (EUA).  This EUA will remain in effect (meaning this test can be used) for the duration of the COVID-19 declaration under Section 564(b)(1) of the Act, 21 U.S.C. section 360bbb-3(b)(1), unless the authorization is terminated or revoked sooner. Performed at [facility], [place] [address] [place], [place] [address]   Culture, Urine     Status: Abnormal   Collection Time: 03/12/19 11:42 AM   Specimen: Urine, Catheterized  Result Value Ref Range Status   Specimen Description URINE, CATHETERIZED  Final   Special Requests   Final    NONE Performed at [facility], [place] [address], [place], [place] [address]    Culture >=100,000 COLONIES/mL ESCHERICHIA COLI (A)  Final   Report Status 03/14/2019 FINAL  Final   Organism ID, Bacteria ESCHERICHIA COLI (A)  Final      Susceptibility   Escherichia coli - MIC*    AMPICILLIN <=2 SENSITIVE Sensitive      CEFAZOLIN <=4 SENSITIVE Sensitive     CEFTRIAXONE <=1 SENSITIVE Sensitive     CIPROFLOXACIN <=0.25 SENSITIVE Sensitive     GENTAMICIN <=1 SENSITIVE Sensitive     IMIPENEM <=0.25 SENSITIVE Sensitive     NITROFURANTOIN <=16 SENSITIVE Sensitive     TRIMETH/SULFA <=20 SENSITIVE Sensitive     AMPICILLIN/SULBACTAM <=2 SENSITIVE Sensitive     PIP/TAZO <=4 SENSITIVE Sensitive     Extended ESBL NEGATIVE Sensitive     * >=100,000 COLONIES/mL ESCHERICHIA COLI         Radiology Studies: No results found.      Scheduled Meds: . atorvastatin  40 mg Oral Daily  . busPIRone  30 mg Oral BID  . carvedilol  6.25 mg Oral BID WC  . cephALEXin  250 mg Oral Q6H  . diltiazem  360 mg Oral Daily  . docusate sodium  100 mg Oral BID  . enoxaparin (LOVENOX) injection  40 mg Subcutaneous Q24H  . feeding supplement  1 Container Oral TID BM  . hydrALAZINE  75 mg Oral TID  . hydroxychloroquine  200 mg Oral Daily  . lidocaine  1 patch Transdermal Q24H  . predniSONE  5 mg Oral Q breakfast  . senna-docusate  1 tablet Oral BID  . sodium chloride flush  3 mL Intravenous Q12H   Continuous Infusions: . lactated ringers 10 mL/hr at 03/15/19 1124     LOS: 3 days    Time spent:90min    Kathie Dike, MD [facility]  03/17/2019, 7:52 PM

## 2019-03-18 ENCOUNTER — Encounter (HOSPITAL_COMMUNITY): Payer: Self-pay | Admitting: General Practice

## 2019-03-18 ENCOUNTER — Other Ambulatory Visit: Payer: Self-pay

## 2019-03-18 LAB — CBC
HCT: 19.3 % — ABNORMAL LOW (ref 36.0–46.0)
HCT: 28.1 % — ABNORMAL LOW (ref 36.0–46.0)
Hemoglobin: 7 g/dL — ABNORMAL LOW (ref 12.0–15.0)
Hemoglobin: 10 g/dL — ABNORMAL LOW (ref 12.0–15.0)
MCH: 36.6 pg — ABNORMAL HIGH (ref 26.0–34.0)
MCH: 33.9 pg (ref 26.0–34.0)
MCHC: 36.3 g/dL — ABNORMAL HIGH (ref 30.0–36.0)
MCHC: 35.6 g/dL (ref 30.0–36.0)
MCV: 101 fL — ABNORMAL HIGH (ref 80.0–100.0)
MCV: 95.3 fL (ref 80.0–100.0)
Platelets: 200 10*3/uL (ref 150–400)
Platelets: 209 10*3/uL (ref 150–400)
RBC: 1.91 MIL/uL — ABNORMAL LOW (ref 3.87–5.11)
RBC: 2.95 MIL/uL — ABNORMAL LOW (ref 3.87–5.11)
RDW: 15.4 % (ref 11.5–15.5)
RDW: 18.6 % — ABNORMAL HIGH (ref 11.5–15.5)
WBC: 6.4 10*3/uL (ref 4.0–10.5)
WBC: 7.5 10*3/uL (ref 4.0–10.5)
nRBC: 0 % (ref 0.0–0.2)
nRBC: 0 % (ref 0.0–0.2)

## 2019-03-18 LAB — BASIC METABOLIC PANEL
Anion gap: 9 (ref 5–15)
BUN: 20 mg/dL (ref 8–23)
CO2: 24 mmol/L (ref 22–32)
Calcium: 8.4 mg/dL — ABNORMAL LOW (ref 8.9–10.3)
Chloride: 90 mmol/L — ABNORMAL LOW (ref 98–111)
Creatinine, Ser: 0.91 mg/dL (ref 0.44–1.00)
GFR calc Af Amer: >60 mL/min (ref >60)
GFR calc non Af Amer: >60 mL/min (ref >60)
Glucose, Bld: 96 mg/dL (ref 70–99)
Potassium: 4 mmol/L (ref 3.5–5.1)
Sodium: 123 mmol/L — ABNORMAL LOW (ref 135–145)

## 2019-03-18 LAB — NOVEL CORONAVIRUS, NAA (HOSP ORDER, SEND-OUT TO REF LAB; TAT 18-24 HRS): SARS-CoV-2, NAA: NOT DETECTED

## 2019-03-18 LAB — PREPARE RBC (CROSSMATCH)

## 2019-03-18 MED ORDER — SODIUM CHLORIDE 1 G PO TABS
1.0000 g | ORAL_TABLET | Freq: Three times a day (TID) | ORAL | Status: DC
  Administered 2019-03-18 – 2019-03-19 (×4): 1 g via ORAL
  Filled 2019-03-18 (×4): qty 1

## 2019-03-18 MED ORDER — SODIUM CHLORIDE 0.9% IV SOLUTION
Freq: Once | INTRAVENOUS | Status: AC
  Administered 2019-03-18: 12:00:00 via INTRAVENOUS

## 2019-03-18 MED ORDER — MUPIROCIN 2 % EX OINT
1.0000 "application " | TOPICAL_OINTMENT | Freq: Two times a day (BID) | CUTANEOUS | Status: DC
  Filled 2019-03-18: qty 22

## 2019-03-18 NOTE — Progress Notes (Signed)
PROGRESS NOTE    Regina Ortiz  JJO:[id number] DOB: [birth year] DOA: 03/12/2019 PCP: Maurice Small, MD  Brief Narrative: 73 year old female with history of lumbar spinal stenosis, cauda equina underwent L4-L5 decompression and fusion by Dr. Ellene Route on 5/29, subsequently discharged to rehab on 6/4 and and then home after a week was seen in the emergency room on 6/15 with urinary retention she was discharged home with a Foley catheter, with plans for urology follow-up, lives by herself, requires assistance in a lot of ADLs, -Readmitted 7/11 with worsening low back pain and left lower extremity pain following a fall which was mechanical.  -In the emergency room she was noted to have sodium of 118, creatinine of 0.9, initial imaging did not note any injuries. -Patient continued to complain of tailbone pain and pelvic discomfort, subsequently had an MRI of her sacrum 7/13 which surprisingly showed right subcapital femur neck fracture, orthopedics consulted  Assessment & Plan:     Hyponatremia -Acute on chronic -Has baseline hyponatremia with sodium in the 125 range, admission sodium was 118, last hospitalization losartan and Aldactone were discontinued -TSH was within normal range, cortisol was low end of normal however patient is on prednisone -Urine sodium is less than 10, urine osmolarity is low at around 100, clinically appears euvolemic, suspect this is secondary to tea and toast diet -IV fluids discontinued, patient was put on fluid restriction, she admits to drinking large quantities of water throughout the day -Sodium level remains low.  Will start on sodium tablets.  Right subcapital femur neck fracture -Noted on imaging, surprisingly most of her pain is in her lower back and tailbone -Orthopedics consulted and patient underwent operative management on 7/14 -Will likely need short-term rehab thereafter, and DVT prophylaxis  Low back pain/L4-L5 disc herniation status post surgical  decompression and stabilization with pedicle screw fixation on 5/29 and by Dr. Ellene Route  -Foley catheter placed 6/15 following ED visit for retention , this was changed 4 days ago  -Neurosurgery following, seen by Dr. Ellene Route today, supportive care recommended -MRI showed improvement in prior compression of thecal sac at L4-L5 with persistent lateral recess stenosis at L3-L4   Leukocytosis:  -Likely reactive, resolved   Possible UTI versus colonization  -Did have mild leukocytosis on admission however afebrile, no concrete symptoms suggestive of infection at this time, started on IV ceftriaxone on admission -Transitioned to oral Keflex and completed 3-day course -Foley catheter placed 6/15 last changed on 7/9, due to follow-up with urology this month  Prolonged QT interval: Acute on chronic.  QTC noted to be 540 on admission. -Given mag yesterday, QTC is improving, hold QT prolonging meds -Potassium corrected  Constipation: - Resolved after receiving enema.  Currently has rectal tube due to persistent stools.  Will change MiraLAX to as needed.  Hypokalemia: -Replaced  Macrocytic anemia:  -Possibly related to blood loss from surgery.  Hemoglobin has trended down to 7.0 and patient is symptomatic.  Will transfuse 1 unit PRBC today.  Essential hypertension: Stable -Continue metoprolol, diltiazem, and hydralazine as tolerated  Rheumatoid arthritis on immunosuppressive therapy: Patient with seronegative rheumatoid arthritis currently on on long term prednisone -Continue prednisone and Plaquenil  COPD, without acute exacerbation: Stable. -Albuterol breathing treatments as needed  Chronic diastolic congestive heart failure: Patient appears to be euvolemic at this time.  Last echo from 09/2017 revealed EF 55-60 % with grade 1 diastolic dysfunction. -Continue to monitor, not on diuretics  Hyperlipidemia -Continue atorvastatin  Anxiety -Continue BuSpar and Klonopin as needed for  anxiety  Osteoporosis -Continue Fosamax at discharge  DVT prophylaxis: Lovenox Code Status: Full Family Communication: No family present at bedside, called and left voicemail for patient's son 7/17 Disposition Plan:  SNF for rehab    Consultants:   NSG  Orthopedics   Procedures:   Pinning of right femoral neck fracture  Antimicrobials:    Subjective: Patient feels very weak and tired today.  She continues to have back pain.  Objective: Vitals:   03/18/19 0558 03/18/19 1326 03/18/19 1347 03/18/19 1623  BP: (!) 145/64 (!) 120/40 (!) 126/53 (!) 176/77  Pulse: 78 80 77 77  Resp: 18  18   Temp: 98.6 F (37 C) 98.1 F (36.7 C) 98 F (36.7 C) 97.6 F (36.4 C)  TempSrc: Oral Oral Oral Axillary  SpO2: 96% 100%  100%  Weight:        Intake/Output Summary (Last 24 hours) at 03/18/2019 2003 Last data filed at 03/18/2019 1700 Gross per 24 hour  Intake 1314.51 ml  Output 2254 ml  Net -939.49 ml   Filed Weights   03/17/19 0602  Weight: 54.9 kg    Examination:  General exam: Alert, awake, oriented x 3 Respiratory system: Clear to auscultation. Respiratory effort normal. Cardiovascular system:RRR. No murmurs, rubs, gallops. Gastrointestinal system: Abdomen is nondistended, soft and nontender. No organomegaly or masses felt. Normal bowel sounds heard. Central nervous system: Alert and oriented. No focal neurological deficits. Extremities: No C/C/E, +pedal pulses Skin: No rashes, lesions or ulcers Psychiatry: Judgement and insight appear normal. Mood & affect appropriate.   Data Reviewed:   CBC: Recent Labs  Lab 03/12/19 0403  03/15/19 0340 03/16/19 0322 03/17/19 0332 03/18/19 0243 03/18/19 1806  WBC 11.9*   < > 6.7 8.6 8.0 6.4 7.5  NEUTROABS 9.9*  --   --   --   --   --   --   HGB 10.8*   < > 8.8* 8.3* 7.6* 7.0* 10.0*  HCT 29.8*   < > 24.9* 24.0* 21.5* 19.3* 28.1*  MCV 100.7*   < > 103.3* 105.3* 102.4* 101.0* 95.3  PLT 225   < > 205 233 220 200 209    < > = values in this interval not displayed.   Basic Metabolic Panel: Recent Labs  Lab 03/13/19 0246  03/14/19 1529 03/15/19 0340 03/16/19 0322 03/17/19 0332 03/18/19 0243  NA 118*   < > 124* 128* 124* 126* 123*  K 3.9   < > 5.5* 4.9 4.7 4.6 4.0  CL 88*   < > 94* 97* 92* 92* 90*  CO2 20*   < > 24 23 22  21* 24  GLUCOSE 87   < > 126* 111* 158* 111* 96  BUN 14   < > 22 23 23  25* 20  CREATININE 1.03*   < > 1.33* 0.98 1.04* 0.99 0.91  CALCIUM 8.1*   < > 8.7* 9.0 9.0 9.0 8.4*  MG 2.2  --   --   --   --   --   --    < > = values in this interval not displayed.   GFR: Estimated Creatinine Clearance: 46.8 mL/min (by C-G formula based on SCr of 0.91 mg/dL). Liver Function Tests: No results for input(s): AST, ALT, ALKPHOS, BILITOT, PROT, ALBUMIN in the last 168 hours. No results for input(s): LIPASE, AMYLASE in the last 168 hours. No results for input(s): AMMONIA in the last 168 hours. Coagulation Profile: No results for input(s): INR, PROTIME in the last 168  hours. Cardiac Enzymes: No results for input(s): CKTOTAL, CKMB, CKMBINDEX, TROPONINI in the last 168 hours. BNP (last 3 results) No results for input(s): PROBNP in the last 8760 hours. HbA1C: No results for input(s): HGBA1C in the last 72 hours. CBG: No results for input(s): GLUCAP in the last 168 hours. Lipid Profile: No results for input(s): CHOL, HDL, LDLCALC, TRIG, CHOLHDL, LDLDIRECT in the last 72 hours. Thyroid Function Tests: No results for input(s): TSH, T4TOTAL, FREET4, T3FREE, THYROIDAB in the last 72 hours. Anemia Panel: No results for input(s): VITAMINB12, FOLATE, FERRITIN, TIBC, IRON, RETICCTPCT in the last 72 hours. Urine analysis:    Component Value Date/Time   COLORURINE YELLOW 03/12/2019 1148   APPEARANCEUR CLEAR 03/12/2019 1148   LABSPEC 1.003 (L) 03/12/2019 1148   PHURINE 7.0 03/12/2019 1148   GLUCOSEU NEGATIVE 03/12/2019 1148   HGBUR NEGATIVE 03/12/2019 1148   BILIRUBINUR NEGATIVE 03/12/2019 1148    KETONESUR NEGATIVE 03/12/2019 1148   PROTEINUR 30 (A) 03/12/2019 1148   UROBILINOGEN 0.2 09/22/2012 1100   NITRITE POSITIVE (A) 03/12/2019 1148   LEUKOCYTESUR MODERATE (A) 03/12/2019 1148   Sepsis Labs: @LABRCNTIP (procalcitonin:4,lacticidven:4)  ) Recent Results (from the past 240 hour(s))  SARS Coronavirus 2 (CEPHEID - Performed in [facility] hospital lab), Hosp Order     Status: None   Collection Time: 03/12/19  6:17 AM   Specimen: Nasopharyngeal Swab  Result Value Ref Range Status   SARS Coronavirus 2 NEGATIVE NEGATIVE Final    Comment: (NOTE) If result is NEGATIVE SARS-CoV-2 target nucleic acids are NOT DETECTED. The SARS-CoV-2 RNA is generally detectable in upper and lower  respiratory specimens during the acute phase of infection. The lowest  concentration of SARS-CoV-2 viral copies this assay can detect is 250  copies / mL. A negative result does not preclude SARS-CoV-2 infection  and should not be used as the sole basis for treatment or other  patient management decisions.  A negative result may occur with  improper specimen collection / handling, submission of specimen other  than nasopharyngeal swab, presence of viral mutation(s) within the  areas targeted by this assay, and inadequate number of viral copies  (<250 copies / mL). A negative result must be combined with clinical  observations, patient history, and epidemiological information. If result is POSITIVE SARS-CoV-2 target nucleic acids are DETECTED. The SARS-CoV-2 RNA is generally detectable in upper and lower  respiratory specimens dur ing the acute phase of infection.  Positive  results are indicative of active infection with SARS-CoV-2.  Clinical  correlation with patient history and other diagnostic information is  necessary to determine patient infection status.  Positive results do  not rule out bacterial infection or co-infection with other viruses. If result is PRESUMPTIVE POSTIVE SARS-CoV-2 nucleic  acids MAY BE PRESENT.   A presumptive positive result was obtained on the submitted specimen  and confirmed on repeat testing.  While 2019 novel coronavirus  (SARS-CoV-2) nucleic acids may be present in the submitted sample  additional confirmatory testing may be necessary for epidemiological  and / or clinical management purposes  to differentiate between  SARS-CoV-2 and other Sarbecovirus currently known to infect humans.  If clinically indicated additional testing with an alternate test  methodology [phone]) is advised. The SARS-CoV-2 RNA is generally  detectable in upper and lower respiratory sp ecimens during the acute  phase of infection. The expected result is Negative. Fact Sheet for Patients:  StrictlyIdeas.no Fact Sheet for Healthcare Providers: BankingDealers.co.za This test is not yet approved or cleared by  the [employer] and has been authorized for detection and/or diagnosis of SARS-CoV-2 by FDA under an Emergency Use Authorization (EUA).  This EUA will remain in effect (meaning this test can be used) for the duration of the COVID-19 declaration under Section 564(b)(1) of the Act, 21 U.S.C. section 360bbb-3(b)(1), unless the authorization is terminated or revoked sooner. Performed at [facility], [place] [address] [place], [place] [address]   Culture, Urine     Status: Abnormal   Collection Time: 03/12/19 11:42 AM   Specimen: Urine, Catheterized  Result Value Ref Range Status   Specimen Description URINE, CATHETERIZED  Final   Special Requests   Final    NONE Performed at [facility], [place] [address], [place], [place] [address]    Culture >=100,000 COLONIES/mL ESCHERICHIA COLI (A)  Final   Report Status 03/14/2019 FINAL  Final   Organism ID, Bacteria ESCHERICHIA COLI (A)  Final      Susceptibility   Escherichia coli - MIC*    AMPICILLIN <=2 SENSITIVE Sensitive     CEFAZOLIN <=4 SENSITIVE Sensitive      CEFTRIAXONE <=1 SENSITIVE Sensitive     CIPROFLOXACIN <=0.25 SENSITIVE Sensitive     GENTAMICIN <=1 SENSITIVE Sensitive     IMIPENEM <=0.25 SENSITIVE Sensitive     NITROFURANTOIN <=16 SENSITIVE Sensitive     TRIMETH/SULFA <=20 SENSITIVE Sensitive     AMPICILLIN/SULBACTAM <=2 SENSITIVE Sensitive     PIP/TAZO <=4 SENSITIVE Sensitive     Extended ESBL NEGATIVE Sensitive     * >=100,000 COLONIES/mL ESCHERICHIA COLI  Novel Coronavirus, NAA (hospital order; send-out to ref lab)     Status: None   Collection Time: 03/17/19  2:40 PM   Specimen: Nasopharyngeal Swab; Respiratory  Result Value Ref Range Status   SARS-CoV-2, NAA NOT DETECTED NOT DETECTED Final    Comment: (NOTE) This test was developed and its performance characteristics determined by [employer]. This test has not been FDA cleared or approved. This test has been authorized by FDA under an Emergency Use Authorization (EUA). This test is only authorized for the duration of time the declaration that circumstances exist justifying the authorization of the emergency use of in vitro diagnostic tests for detection of SARS-CoV-2 virus and/or diagnosis of COVID-19 infection under section 564(b)(1) of the Act, 21 U.S.C. 270WCB-7(S)(2), unless the authorization is terminated or revoked sooner. When diagnostic testing is negative, the possibility of a false negative result should be considered in the context of a patient's recent exposures and the presence of clinical signs and symptoms consistent with COVID-19. An individual without symptoms of COVID-19 and who is not shedding SARS-CoV-2 virus would expect to have a negative (not detected) result in this assay. Performed  At: [facility] [address] [place], [place] [id number] Rush Farmer MD WV:[id number]    [place]  Final    Comment: Performed at [facility], [place] [address], [place],  [address]          Radiology Studies: No results found.      Scheduled Meds: . atorvastatin  40 mg Oral Daily  . busPIRone  30 mg Oral BID  . carvedilol  6.25 mg Oral BID WC  . diltiazem  360 mg Oral Daily  . docusate sodium  100 mg Oral BID  . feeding supplement  1 Container Oral TID BM  . hydrALAZINE  75 mg Oral TID  . hydroxychloroquine  200 mg Oral Daily  . lidocaine  1 patch Transdermal Q24H  . mupirocin ointment  1 application Nasal BID  . predniSONE  5 mg Oral Q breakfast  . senna-docusate  1 tablet Oral BID  . sodium chloride flush  3 mL Intravenous Q12H  . sodium chloride  1 g Oral TID WC   Continuous Infusions: . lactated ringers 10 mL/hr at 03/17/19 2006     LOS: 4 days    Time spent:28min    Kathie Dike, MD [facility]  03/18/2019, 8:03 PM

## 2019-03-18 NOTE — Progress Notes (Signed)
Physical Therapy Treatment Patient Details Name: Regina Ortiz MRN: [id number] DOB: [birth year] Today's Date: 03/18/2019    History of Present Illness 73 year old female with history of lumbar spinal stenosis, cauda equina underwent L4-L5 decompression and fusion by Dr. Ellene Route on 5/29, subsequently discharged to rehab on 6/4 and and then home after a week was seen in the emergency room on 6/15 with urinary retention she was discharged home with a Foley catheter, with plans for urology follow-up, lives by herself, requires assistance in a lot of ADLs,-Readmitted 7/11 with worsening low back pain and left lower extremity pain following a fall which was mechanical. -In the emergency room she was noted to have sodium of 118, creatinine of 0.9, initial imaging did not note any injuries.-Patient continued to complain of tailbone pain and pelvic discomfort, subsequently had an MRI of her sacrum 7/13 which surprisingly showed right subcapital femur neck fracture, orthopedics consulted. now s/p femoral neck pinning on 7/14    PT Comments    Pt progressing towards goals. Able to increase ambulation distance, however, remains limited secondary to pain. Pt slightly less anxious, but continues to present with anxiety which limits participation. Reviewed supine HEP. Current recommendations appropriate. Will continue to follow acutely to maximize functional mobility independence and safety.    Follow Up Recommendations  Supervision/Assistance - 24 hour;SNF     Equipment Recommendations  None recommended by PT    Recommendations for Other Services OT consult     Precautions / Restrictions Precautions Precautions: Fall Restrictions Weight Bearing Restrictions: Yes RLE Weight Bearing: Weight bearing as tolerated    Mobility  Bed Mobility Overal bed mobility: Needs Assistance Bed Mobility: Supine to Sit;Sit to Supine     Supine to sit: Supervision Sit to supine: Supervision   General bed mobility  comments: Supervision for safety.   Transfers Overall transfer level: Needs assistance Equipment used: Rolling walker (2 wheeled) Transfers: Sit to/from Stand Sit to Stand: Min guard         General transfer comment: Min guard for safety. cues for safe hand placement.   Ambulation/Gait Ambulation/Gait assistance: Min guard Gait Distance (Feet): 20 Feet Assistive device: Rolling walker (2 wheeled) Gait Pattern/deviations: Step-to pattern;Decreased step length - right;Decreased step length - left;Decreased weight shift to right Gait velocity: Decreased    General Gait Details: Slow, antalgic gait. Distance limited secondary to pain. Pt with improved mechanics when using RW this session   Stairs             Wheelchair Mobility    Modified Rankin (Stroke Patients Only)       Balance Overall balance assessment: Needs assistance Sitting-balance support: Feet supported;No upper extremity supported Sitting balance-Leahy Scale: Fair     Standing balance support: Bilateral upper extremity supported;During functional activity Standing balance-Leahy Scale: Poor Standing balance comment: Reliant on BUE support                             Cognition Arousal/Alertness: Awake/alert Behavior During Therapy: Anxious Overall Cognitive Status: Impaired/Different from baseline Area of Impairment: Problem solving;Safety/judgement;Memory                     Memory: Decreased short-term memory   Safety/Judgement: Decreased awareness of safety   Problem Solving: Slow processing General Comments: Pt with less anxiety this session, however, was very anxious at the thought of getting blood.       Exercises Total Joint Exercises Ankle Circles/Pumps: AROM;Both;20  reps;Supine Quad Sets: AROM;Both;10 reps;Supine Heel Slides: AROM;Both;10 reps;Supine    General Comments        Pertinent Vitals/Pain Pain Assessment: Faces Faces Pain Scale: Hurts even  more Pain Location: RLE  Pain Descriptors / Indicators: Discomfort;Grimacing;Guarding Pain Intervention(s): Limited activity within patient's tolerance;Monitored during session;Repositioned    Home Living                      Prior Function            PT Goals (current goals can now be found in the care plan section) Acute Rehab PT Goals Patient Stated Goal: to decrease pain  PT Goal Formulation: With patient Time For Goal Achievement: 03/26/19 Potential to Achieve Goals: Good Progress towards PT goals: Progressing toward goals    Frequency    Min 3X/week      PT Plan Current plan remains appropriate    Co-evaluation              AM-PAC PT "6 Clicks" Mobility   Outcome Measure  Help needed turning from your back to your side while in a flat bed without using bedrails?: A Little Help needed moving from lying on your back to sitting on the side of a flat bed without using bedrails?: A Little Help needed moving to and from a bed to a chair (including a wheelchair)?: A Little Help needed standing up from a chair using your arms (e.g., wheelchair or bedside chair)?: A Little Help needed to walk in hospital room?: A Little Help needed climbing 3-5 steps with a railing? : Total 6 Click Score: 16    End of Session Equipment Utilized During Treatment: Gait belt Activity Tolerance: Patient limited by pain Patient left: in bed;with call bell/phone within reach Nurse Communication: Mobility status PT Visit Diagnosis: Other abnormalities of gait and mobility (R26.89);Muscle weakness (generalized) (M62.81);Difficulty in walking, not elsewhere classified (R26.2) Pain - Right/Left: Right Pain - part of body: Leg     Time: 1137-1200 PT Time Calculation (min) (ACUTE ONLY): 23 min  Charges:  $Gait Training: 8-22 mins $Therapeutic Exercise: 8-22 mins                     Leighton Ruff, PT, DPT  Acute Rehabilitation Services  Pager: [phone] Office:  [phone]    Rudean Hitt 03/18/2019, 1:32 PM

## 2019-03-18 NOTE — Progress Notes (Signed)
Son, Shanon Brow, called asking about patient. Patient agreed for son to receive information. Questions answered. Son would like to be updated tomorrow by attending and SW.

## 2019-03-18 NOTE — Progress Notes (Signed)
Peer to peer overturned for insurance approval for SNF placement. Pt will d/c to SNF., [facility] when medically ready. COVID pending. Whitman Hero RN,BSN,CN

## 2019-03-18 NOTE — Plan of Care (Signed)
  Problem: Health Behavior/Discharge Planning: Goal: Ability to manage health-related needs will improve Outcome: Progressing   Problem: Clinical Measurements: Goal: Ability to maintain clinical measurements within normal limits will improve 03/18/2019 0231 by Aris Lot, RN Outcome: Progressing 03/18/2019 0231 by Aris Lot, RN Outcome: Progressing Goal: Will remain free from infection Outcome: Progressing Goal: Diagnostic test results will improve Outcome: Progressing

## 2019-03-18 NOTE — Progress Notes (Signed)
    Subjective: Feeling a little weak but pain and mobilizing is better.  More clear headed today.  Seems less anxious.    Objective:   VITALS:   Vitals:   03/17/19 0602 03/17/19 1304 03/17/19 2207 03/18/19 0558  BP:  (!) 160/61 (!) 150/64 (!) 145/64  Pulse:  82 81 78  Resp:  19 18 18   Temp:  98.4 F (36.9 C) 98.1 F (36.7 C) 98.6 F (37 C)  TempSrc:  Oral Oral Oral  SpO2:  99% 98% 96%  Weight: 54.9 kg      CBC Latest Ref Rng & Units 03/18/2019 03/17/2019 03/16/2019  WBC 4.0 - 10.5 K/uL 6.4 8.0 8.6  Hemoglobin 12.0 - 15.0 g/dL 7.0(L) 7.6(L) 8.3(L)  Hematocrit 36.0 - 46.0 % 19.3(L) 21.5(L) 24.0(L)  Platelets 150 - 400 K/uL 200 220 233   BMP Latest Ref Rng & Units 03/18/2019 03/17/2019 03/16/2019  Glucose 70 - 99 mg/dL 96 111(H) 158(H)  BUN 8 - 23 mg/dL 20 25(H) 23  Creatinine 0.44 - 1.00 mg/dL 0.91 0.99 1.04(H)  Sodium 135 - 145 mmol/L 123(L) 126(L) 124(L)  Potassium 3.5 - 5.1 mmol/L 4.0 4.6 4.7  Chloride 98 - 111 mmol/L 90(L) 92(L) 92(L)  CO2 22 - 32 mmol/L 24 21(L) 22  Calcium 8.9 - 10.3 mg/dL 8.4(L) 9.0 9.0   Intake/Output      07/16 0701 - 07/17 0700 07/17 0701 - 07/18 0700   P.O. 480    Total Intake(mL/kg) 480 (8.7)    Urine (mL/kg/hr) 1700 (1.3)    Stool 3    Total Output 1703    Net -1223         Urine Occurrence 2 x    Stool Occurrence 2 x       Physical Exam: General: NAD.  Upright in bed.  Calm, conversant. MSK RLE: Neurovascularly intact Sensation intact distally Feet warm Dorsiflexion/Plantar flexion intact Incision: dressing C/D/I   Assessment: 3 Days Post-Op  S/P Procedure(s) (LRB): PINNING OF FEMORAL NECK FRACTURE (Right) by Dr. Ernesta Amble. Percell Miller on 03/15/2019  Principal Problem:   Hyponatremia Active Problems:   Anemia   Hypokalemia   Hypertension   RA (rheumatoid arthritis) (HCC)   Chronic hyponatremia   Lumbosacral radiculopathy   Constipation   Acute urinary retention   Fall at home, initial encounter   Fracture of femoral  neck, right, closed ([place])   Pressure injury of skin   Closed right femoral neck fracture, status post cannulated hip pinning Improving pain mobilization   Plan: Continue to mobilize Incentive Spirometry Apply ice PRN  Weightbearing: WBAT RLE Insicional and dressing care: Dressings left intact until follow-up Showering: Keep dressing dry VTE prophylaxis: Lovenox 40mg  qd 30 days, SCDs, ambulation Pain control: Minimize narcotics.  Tylenol.  Norco for severe pain. Contact information:  Edmonia Lynch MD, Roxan Hockey PA-C  Dispo: Therapy evaluations ongoing - SNF recommended.  Discharge when ready medically. Follow up in the office with Dr. Alain Marion in 2 weeks.  Please call with questions.   Charna Elizabeth Martensen III, PA-C 03/18/2019, 8:40 AM

## 2019-03-19 DIAGNOSIS — R319 Hematuria, unspecified: Secondary | ICD-10-CM | POA: Diagnosis not present

## 2019-03-19 DIAGNOSIS — K5901 Slow transit constipation: Secondary | ICD-10-CM | POA: Diagnosis not present

## 2019-03-19 DIAGNOSIS — M5416 Radiculopathy, lumbar region: Secondary | ICD-10-CM | POA: Diagnosis not present

## 2019-03-19 DIAGNOSIS — M810 Age-related osteoporosis without current pathological fracture: Secondary | ICD-10-CM | POA: Diagnosis not present

## 2019-03-19 DIAGNOSIS — F419 Anxiety disorder, unspecified: Secondary | ICD-10-CM | POA: Diagnosis not present

## 2019-03-19 DIAGNOSIS — N390 Urinary tract infection, site not specified: Secondary | ICD-10-CM | POA: Diagnosis not present

## 2019-03-19 DIAGNOSIS — Z7401 Bed confinement status: Secondary | ICD-10-CM | POA: Diagnosis not present

## 2019-03-19 DIAGNOSIS — F329 Major depressive disorder, single episode, unspecified: Secondary | ICD-10-CM | POA: Diagnosis not present

## 2019-03-19 DIAGNOSIS — J441 Chronic obstructive pulmonary disease with (acute) exacerbation: Secondary | ICD-10-CM | POA: Diagnosis not present

## 2019-03-19 DIAGNOSIS — Z9181 History of falling: Secondary | ICD-10-CM | POA: Diagnosis not present

## 2019-03-19 DIAGNOSIS — G834 Cauda equina syndrome: Secondary | ICD-10-CM | POA: Diagnosis not present

## 2019-03-19 DIAGNOSIS — S72001D Fracture of unspecified part of neck of right femur, subsequent encounter for closed fracture with routine healing: Secondary | ICD-10-CM | POA: Diagnosis not present

## 2019-03-19 DIAGNOSIS — K5900 Constipation, unspecified: Secondary | ICD-10-CM | POA: Diagnosis not present

## 2019-03-19 DIAGNOSIS — M6281 Muscle weakness (generalized): Secondary | ICD-10-CM | POA: Diagnosis not present

## 2019-03-19 DIAGNOSIS — Z79899 Other long term (current) drug therapy: Secondary | ICD-10-CM | POA: Diagnosis not present

## 2019-03-19 DIAGNOSIS — D62 Acute posthemorrhagic anemia: Secondary | ICD-10-CM | POA: Diagnosis not present

## 2019-03-19 DIAGNOSIS — M069 Rheumatoid arthritis, unspecified: Secondary | ICD-10-CM | POA: Diagnosis not present

## 2019-03-19 DIAGNOSIS — R339 Retention of urine, unspecified: Secondary | ICD-10-CM | POA: Diagnosis not present

## 2019-03-19 DIAGNOSIS — R52 Pain, unspecified: Secondary | ICD-10-CM | POA: Diagnosis not present

## 2019-03-19 DIAGNOSIS — I10 Essential (primary) hypertension: Secondary | ICD-10-CM | POA: Diagnosis not present

## 2019-03-19 DIAGNOSIS — R41841 Cognitive communication deficit: Secondary | ICD-10-CM | POA: Diagnosis not present

## 2019-03-19 DIAGNOSIS — D649 Anemia, unspecified: Secondary | ICD-10-CM | POA: Diagnosis not present

## 2019-03-19 DIAGNOSIS — R5381 Other malaise: Secondary | ICD-10-CM | POA: Diagnosis not present

## 2019-03-19 DIAGNOSIS — M5417 Radiculopathy, lumbosacral region: Secondary | ICD-10-CM | POA: Diagnosis not present

## 2019-03-19 DIAGNOSIS — R600 Localized edema: Secondary | ICD-10-CM | POA: Diagnosis not present

## 2019-03-19 DIAGNOSIS — D500 Iron deficiency anemia secondary to blood loss (chronic): Secondary | ICD-10-CM | POA: Diagnosis not present

## 2019-03-19 DIAGNOSIS — Z720 Tobacco use: Secondary | ICD-10-CM | POA: Diagnosis not present

## 2019-03-19 DIAGNOSIS — E876 Hypokalemia: Secondary | ICD-10-CM | POA: Diagnosis not present

## 2019-03-19 DIAGNOSIS — Y92009 Unspecified place in unspecified non-institutional (private) residence as the place of occurrence of the external cause: Secondary | ICD-10-CM | POA: Diagnosis not present

## 2019-03-19 DIAGNOSIS — M545 Low back pain: Secondary | ICD-10-CM | POA: Diagnosis not present

## 2019-03-19 DIAGNOSIS — I5032 Chronic diastolic (congestive) heart failure: Secondary | ICD-10-CM | POA: Diagnosis not present

## 2019-03-19 DIAGNOSIS — E785 Hyperlipidemia, unspecified: Secondary | ICD-10-CM | POA: Diagnosis not present

## 2019-03-19 DIAGNOSIS — R9431 Abnormal electrocardiogram [ECG] [EKG]: Secondary | ICD-10-CM | POA: Diagnosis not present

## 2019-03-19 DIAGNOSIS — E2740 Unspecified adrenocortical insufficiency: Secondary | ICD-10-CM | POA: Diagnosis not present

## 2019-03-19 DIAGNOSIS — N3000 Acute cystitis without hematuria: Secondary | ICD-10-CM | POA: Diagnosis not present

## 2019-03-19 DIAGNOSIS — E871 Hypo-osmolality and hyponatremia: Secondary | ICD-10-CM | POA: Diagnosis not present

## 2019-03-19 DIAGNOSIS — R350 Frequency of micturition: Secondary | ICD-10-CM | POA: Diagnosis not present

## 2019-03-19 DIAGNOSIS — W19XXXA Unspecified fall, initial encounter: Secondary | ICD-10-CM | POA: Diagnosis not present

## 2019-03-19 DIAGNOSIS — I110 Hypertensive heart disease with heart failure: Secondary | ICD-10-CM | POA: Diagnosis not present

## 2019-03-19 DIAGNOSIS — M2550 Pain in unspecified joint: Secondary | ICD-10-CM | POA: Diagnosis not present

## 2019-03-19 DIAGNOSIS — K219 Gastro-esophageal reflux disease without esophagitis: Secondary | ICD-10-CM | POA: Diagnosis not present

## 2019-03-19 DIAGNOSIS — R338 Other retention of urine: Secondary | ICD-10-CM | POA: Diagnosis not present

## 2019-03-19 DIAGNOSIS — S72001S Fracture of unspecified part of neck of right femur, sequela: Secondary | ICD-10-CM | POA: Diagnosis not present

## 2019-03-19 LAB — TYPE AND SCREEN
ABO/RH(D): A POS
Antibody Screen: NEGATIVE
Unit division: 0

## 2019-03-19 LAB — BASIC METABOLIC PANEL
Anion gap: 8 (ref 5–15)
BUN: 17 mg/dL (ref 8–23)
CO2: 25 mmol/L (ref 22–32)
Calcium: 8.7 mg/dL — ABNORMAL LOW (ref 8.9–10.3)
Chloride: 94 mmol/L — ABNORMAL LOW (ref 98–111)
Creatinine, Ser: 0.86 mg/dL (ref 0.44–1.00)
GFR calc Af Amer: >60 mL/min (ref >60)
GFR calc non Af Amer: >60 mL/min (ref >60)
Glucose, Bld: 108 mg/dL — ABNORMAL HIGH (ref 70–99)
Potassium: 3.9 mmol/L (ref 3.5–5.1)
Sodium: 127 mmol/L — ABNORMAL LOW (ref 135–145)

## 2019-03-19 LAB — CBC
HCT: 27.3 % — ABNORMAL LOW (ref 36.0–46.0)
Hemoglobin: 9.7 g/dL — ABNORMAL LOW (ref 12.0–15.0)
MCH: 33.8 pg (ref 26.0–34.0)
MCHC: 35.5 g/dL (ref 30.0–36.0)
MCV: 95.1 fL (ref 80.0–100.0)
Platelets: 206 10*3/uL (ref 150–400)
RBC: 2.87 MIL/uL — ABNORMAL LOW (ref 3.87–5.11)
RDW: 19.3 % — ABNORMAL HIGH (ref 11.5–15.5)
WBC: 7.7 10*3/uL (ref 4.0–10.5)
nRBC: 0 % (ref 0.0–0.2)

## 2019-03-19 LAB — BPAM RBC
Blood Product Expiration Date: 202008132359
ISSUE DATE / TIME: 202007171319
Unit Type and Rh: 6200

## 2019-03-19 MED ORDER — DOCUSATE SODIUM 100 MG PO CAPS: 100.0000 mg | ORAL_CAPSULE | Freq: Two times a day (BID) | ORAL | 0 refills | Status: DC

## 2019-03-19 MED ORDER — SENNOSIDES-DOCUSATE SODIUM 8.6-50 MG PO TABS: 1.0000 | ORAL_TABLET | Freq: Two times a day (BID) | ORAL | Status: DC

## 2019-03-19 MED ORDER — CLONAZEPAM 0.5 MG PO TABS: 0.5000 mg | ORAL_TABLET | Freq: Three times a day (TID) | ORAL | 0 refills | Status: DC | PRN

## 2019-03-19 MED ORDER — METHOCARBAMOL 500 MG PO TABS: 500.0000 mg | ORAL_TABLET | Freq: Three times a day (TID) | ORAL | 0 refills | Status: DC | PRN

## 2019-03-19 MED ORDER — SODIUM CHLORIDE 1 G PO TABS: 1.0000 g | ORAL_TABLET | Freq: Two times a day (BID) | ORAL | 0 refills | Status: DC

## 2019-03-19 NOTE — Progress Notes (Signed)
Patient was discharged to nursing home [facility]) by MD order; discharged instructions review and sent to facility via [facility] with care notes and prescriptions; IV DIC; skin - dressings were changed before D/C (sacrum, bilateral arms, RLQ); facility was called and report was given to the nurse who is going to receive the patient; family alert about patient discharge to SNF; patient will be transported to facility via [place].

## 2019-03-19 NOTE — Progress Notes (Signed)
Patient resting in bed with eyes closed at this time. No distress noted, no complaints. Will continue to monitor.

## 2019-03-19 NOTE — Progress Notes (Signed)
Patient c/o back pain rated 8/10 and medial chest pain rated 5/10 as a dull ache. [place], NP notified of chest pain. Robaxin given PO per PRN orders. Patient states she gets chest pain chronically d/t anxiety. VS per flow sheet. Patient anxious but resting in bed with no apparent distress noted. No new orders. Will continue to monitor.

## 2019-03-19 NOTE — TOC Transition Note (Signed)
Transition of Care [facility]) - CM/SW Discharge Note   Patient Details  Name: Regina Ortiz MRN: [id number] Date of Birth: 1946-08-25  Transition of Care [facility]) CM/SW Contact:  Gabrielle Dare Phone Number: 03/19/2019, 1:49 PM   Clinical Narrative: Patient will Discharge To: [facility] Anticipated DC Date:03/19/2019 Family Notified:yes, son Arlee Muslim Transport [id number]   Per MD patient ready for DC to [facility] . RN, patient, patient's family, and facility notified of DC. Assessment, Fl2/Pasrr, and Discharge Summary sent to facility. RN given number for report ([phone], Room 306). DC packet on chart. Ambulance transport requested for patient.   CSW signing off.  Reed Breech [facility] [phone]       Final next level of care: Skilled Nursing Facility Barriers to Discharge: No Barriers Identified   Patient Goals and CMS Choice Patient states their goals for this hospitalization and ongoing recovery are:: to go home CMS Medicare.gov Compare Post Acute Care list provided to:: Patient Choice offered to / list presented to : Patient  Discharge Placement              Patient chooses bed at: [facility] Patient to be transferred to facility by: [place] Name of family member notified: Arlee Muslim Patient and family notified of of transfer: 03/19/19  Discharge Plan and Services                          HH Arranged: PT, OT [facility] Agency: [place] Date [place]: 03/13/19 Time [place]: 9629 Representative spoke with at [place]: [place] ([place]) Interventions     Readmission Risk Interventions Readmission Risk Prevention Plan 02/22/2019 02/16/2019  Transportation Screening Complete Complete  PCP or Specialist Appt within 3-5 Days - Complete  HRI or [place] - Complete  Social Work Consult for [place] Planning/Counseling - Complete  Palliative Care Screening - Not Applicable   Medication Review Press photographer) Complete Complete  PCP or Specialist appointment within 3-5 days of discharge Complete -  [place] or Home Care Consult Complete -  SW Recovery Care/Counseling Consult Complete -  Palliative Care Screening Not Applicable -  [place] Not Applicable -  Some recent data might be hidden

## 2019-03-19 NOTE — Discharge Summary (Signed)
Physician Discharge Summary  Regina Ortiz:[id number] DOB: 06-29-1946 DOA: 03/12/2019  PCP: Maurice Small, MD  Admit date: 03/12/2019 Discharge date: 03/19/2019  Admitted From: home Disposition:  SNF  Recommendations for Outpatient Follow-up:  1. Follow up with PCP in 1-2 weeks 2. Please obtain BMP/CBC in one week 3. Follow up with Dr. Alain Marion in 2 weeks 4. WBAT RLE 5. Lovenox for DVT prophylaxis for 30 days  Discharge Condition:stable CODE STATUS: full code Diet recommendation: Regular diet  Brief/Interim Summary: 73 year old female with a history of lumbar spinal stenosis who underwent L4-L5 decompression and fusion by Dr. Ellene Route on 5/29 and was subsequently discharged to skilled nursing facility on 6/4.  Patient went home after approximately 12 days at the rehab.  She came back to the emergency room on 6/15 with urinary retention and was discharged home with a Foley catheter.  Plans were for urology follow-up.  Patient was readmitted to the hospital on 7/11 with worsening low back pain after having a fall.  Lab work indicated sodium of 118.  Further imaging indicated right subcapital femur neck fracture.  She was admitted for further operative management.  Discharge Diagnoses:  Principal Problem:   Hyponatremia Active Problems:   Anemia   Hypokalemia   Hypertension   RA (rheumatoid arthritis) (HCC)   Chronic hyponatremia   Lumbosacral radiculopathy   Constipation   Acute urinary retention   Fall at home, initial encounter   Fracture of femoral neck, right, closed ([place])   Pressure injury of skin  1. Hyponatremia.  This is been a chronic issue.  Baseline sodium appears to range around 125.  On admission, she was noted to have a sodium of 118.  She was previously taking losartan and Aldactone which have been discontinued in the past.  TSH was noted to be in normal range.  Cortisol was low however, patient has chronically been on prednisone.  Urine sodium less than 10.  Urine  osmolarity was also low at 100.  Clinically, the patient appears to be euvolemic.  It was suspected that her hyponatremia is related to tea and toast diet.  Patient was initially started on fluid restrictions, but did not have any significant improvement of sodium.  She was started on salt tablets which has resulted in improvement of sodium.  We will continue the same 2. Right subcapital femur neck fracture.  Underwent operative management on 7/14.  Plan is for short-term rehab.  She will follow-up with orthopedics in 2 weeks. 3. Acute blood loss anemia.  Related to recent surgery.  The patient required transfusion of 1 unit of PRBC.  Follow-up hemoglobin has improved.  Continue to monitor with repeat CBC in 1 week. 4. Low back pain/L4-L5 disc herniation.  Patient was followed up by Dr. Ellene Route in the hospital.  MRI showed improvement in prior compression of thecal sac at L4-L5.  Follow-up with neurosurgery as previously planned. 5. Possible UTI versus colonization.  Patient had a mild leukocytosis on admission.  She was treated with ceftriaxone and transitioned to oral Keflex.  She has Foley catheter in place and will follow-up with urology later this month. 6. Rheumatoid arthritis.  Continue chronic immunosuppressive therapy with steroids and hydroxychloroquine. 7. Anxiety.  This is playing a large part in her symptomatology.  She is on BuSpar and Klonopin.  Would likely benefit from outpatient psychiatry follow-up. 8. Osteoporosis.  Continue Fosamax on discharge. 9. Chronic diastolic heart failure.  Appears compensated at this time.  She is not chronically on  diuretics. 10. COPD.  No wheezing or shortness of breath at this time. 11. Hypertension.  Blood pressure currently stable on metoprolol, diltiazem and hydralazine.  Discharge Instructions  Discharge Instructions    Diet - low sodium heart healthy   Complete by: As directed    Increase activity slowly   Complete by: As directed       Allergies as of 03/19/2019      Reactions   Latex Swelling, Other (See Comments)    fever blisters   Augmentin [amoxicillin-pot Clavulanate] Nausea And Vomiting   Doxycycline Other (See Comments)   Blurred vision, nervous, unsteady   Lactose Intolerance (gi) Other (See Comments)   Unknown, per pt   Lialda [mesalamine] Diarrhea   Prinivil [lisinopril] Diarrhea   Triamcinolone Acetonide Other (See Comments)   REDNESS AND PAIN   Amlodipine Rash   Lotensin [benazepril Hcl] Rash      Medication List    STOP taking these medications   traMADol 50 MG tablet Commonly known as: ULTRAM     TAKE these medications   acetaminophen 325 MG tablet Commonly known as: TYLENOL Take 2 tablets (650 mg total) by mouth every 6 (six) hours.   alendronate 70 MG tablet Commonly known as: FOSAMAX Take 70 mg by mouth every Monday.   atorvastatin 40 MG tablet Commonly known as: LIPITOR Take 1 tablet by mouth once daily   busPIRone 30 MG tablet Commonly known as: BUSPAR Take 30 mg by mouth 2 (two) times daily.   CALTRATE 600+D PO Take 1 tablet by mouth daily.   carvedilol 6.25 MG tablet Commonly known as: Coreg Take 1 tablet (6.25 mg total) by mouth 2 (two) times daily with a meal.   clonazePAM 0.5 MG tablet Commonly known as: KlonoPIN Take 1 tablet (0.5 mg total) by mouth 3 (three) times daily as needed for anxiety. What changed: when to take this   diltiazem 360 MG 24 hr capsule Commonly known as: TIAZAC Take 360 mg by mouth daily.   docusate sodium 100 MG capsule Commonly known as: COLACE Take 1 capsule (100 mg total) by mouth 2 (two) times daily.   enoxaparin 40 MG/0.4ML injection Commonly known as: LOVENOX Inject 0.4 mLs (40 mg total) into the skin daily for 30 doses. For DVT prophylaxis   hydrALAZINE 25 MG tablet Commonly known as: APRESOLINE Take 3 tablets (75 mg total) by mouth 3 (three) times daily.   HYDROcodone-acetaminophen 5-325 MG tablet Commonly known as:  Norco Take 1 tablet by mouth every 6 (six) hours as needed for up to 5 days (breakthrough pain).   hydroxychloroquine 200 MG tablet Commonly known as: PLAQUENIL Take 200 mg by mouth daily.   methocarbamol 500 MG tablet Commonly known as: ROBAXIN Take 1 tablet (500 mg total) by mouth every 8 (eight) hours as needed for muscle spasms.   polyethylene glycol 17 g packet Commonly known as: MIRALAX / GLYCOLAX Take 17 g by mouth daily as needed for mild constipation.   predniSONE 5 MG tablet Commonly known as: DELTASONE Take 5 mg by mouth daily with breakfast. --  LONG-TERM USE FOR RA   senna-docusate 8.6-50 MG tablet Commonly known as: Senokot-S Take 1 tablet by mouth 2 (two) times daily.   sodium chloride 1 g tablet Take 1 tablet (1 g total) by mouth 2 (two) times daily with a meal.   Vitamin D3 25 MCG (1000 UT) Caps Take 1,000 Units by mouth daily.       Contact information for follow-up  providers    Care, [facility] Follow up.   Specialty: [place] Why: for home health services  Contact information: [place] [address] [phone]        Renette Butters, MD In 2 weeks.   Specialty: Orthopedic Surgery Contact information: [address] [place] [address] [phone]            Contact information for after-discharge care    Destination    [facility] .   Service: Skilled Nursing Contact information: [address] [place] [address] [phone]                 Allergies  Allergen Reactions  . Latex Swelling and Other (See Comments)     fever blisters  . Augmentin [Amoxicillin-Pot Clavulanate] Nausea And Vomiting  . Doxycycline Other (See Comments)    Blurred vision, nervous, unsteady  . Lactose Intolerance (Gi) Other (See Comments)    Unknown, per pt  . Lialda [Mesalamine] Diarrhea  . Prinivil [Lisinopril] Diarrhea  .  Triamcinolone Acetonide Other (See Comments)    REDNESS AND PAIN  . Amlodipine Rash  . Lotensin [Benazepril Hcl] Rash    Consultations:  Orthopedics   Procedures/Studies: Dg Pelvis 1-2 Views  Result Date: 03/13/2019 CLINICAL DATA:  Pelvic pain. Status post fall last night. Initial encounter. EXAM: PELVIS - 1-2 VIEW COMPARISON:  Single-view of the pelvis 03/12/2019. CT abdomen and pelvis 02/15/2019. FINDINGS: Possible right parasymphyseal pubic bone fracture seen on yesterday's examination is not visible today. No acute bony or joint abnormality is identified. The patient is status post L4-5 fusion. IMPRESSION: No acute abnormality. Possible right parasymphyseal pubic bone fracture on yesterday's examination is likely due to overlapping shadows. Electronically Signed   By: Inge Rise M.D.   On: 03/13/2019 14:08   Dg Elbow Complete Left  Result Date: 03/12/2019 CLINICAL DATA:  Initial evaluation for acute trauma, fall. EXAM: LEFT ELBOW - COMPLETE 3+ VIEW COMPARISON:  None. FINDINGS: There is no evidence of fracture, dislocation, or joint effusion. There is no evidence of arthropathy or other focal bone abnormality. Soft tissues are unremarkable. IMPRESSION: No acute osseous abnormality about the left elbow. Electronically Signed   By: Jeannine Boga M.D.   On: 03/12/2019 04:50   Dg Elbow Complete Right  Result Date: 03/12/2019 CLINICAL DATA:  Initial evaluation for acute trauma, fall. EXAM: RIGHT ELBOW - COMPLETE 3+ VIEW COMPARISON:  None. FINDINGS: No acute fracture dislocation. No joint effusion. Radial head intact. Mild degenerative spurring present about the elbow. No visible soft tissue injury. IMPRESSION: No acute osseous abnormality about the right elbow. Electronically Signed   By: Jeannine Boga M.D.   On: 03/12/2019 04:52   Ct Head Wo Contrast  Result Date: 03/12/2019 CLINICAL DATA:  Pt BIB [facility] from home alone. Pt was changing her thermostat when she lost her  balance and fell. Pt has hx of back surgery on May 29 EXAM: CT HEAD WITHOUT CONTRAST CT MAXILLOFACIAL WITHOUT CONTRAST CT CERVICAL SPINE WITHOUT CONTRAST TECHNIQUE: Multidetector CT imaging of the head, cervical spine, and maxillofacial structures were performed using the standard protocol without intravenous contrast. Multiplanar CT image reconstructions of the cervical spine and maxillofacial structures were also generated. COMPARISON:  09/24/2017 FINDINGS: CT HEAD FINDINGS Brain: No evidence of acute infarction, hemorrhage, hydrocephalus, extra-axial collection or mass lesion/mass effect. Vascular: No hyperdense vessel or unexpected calcification. Skull: Normal. Negative for fracture or focal lesion.  Other: None. CT MAXILLOFACIAL FINDINGS Osseous: No fracture or mandibular dislocation. No destructive process. Orbits: Negative. No traumatic or inflammatory finding. Sinuses: Clear. Soft tissues: Negative. CT CERVICAL SPINE FINDINGS Alignment: Normal. Skull base and vertebrae: No acute fracture. No primary bone lesion or focal pathologic process. Soft tissues and spinal canal: No prevertebral fluid or swelling. No visible canal hematoma. Disc levels: Mild loss of disc height at C4-C5 and C5-C6. Small endplate osteophytes at these levels. Minor disc bulging. No convincing disc herniation. There are facet degenerative changes most evident bilaterally at C5-C6. Upper chest: No acute findings. No masses or adenopathy. Mild centrilobular emphysema and scarring at the apices of the lungs. Other: None. IMPRESSION: HEAD CT 1. No intracranial abnormality. 2. No skull fracture. MAXILLOFACIAL CT 1. No fracture or acute finding. CERVICAL CT 1. No fracture or acute finding. Electronically Signed   By: Lajean Manes M.D.   On: 03/12/2019 04:39   Ct Cervical Spine Wo Contrast  Result Date: 03/12/2019 CLINICAL DATA:  Pt BIB [facility] from home alone. Pt was changing her thermostat when she lost her balance and fell. Pt has hx of  back surgery on May 29 EXAM: CT HEAD WITHOUT CONTRAST CT MAXILLOFACIAL WITHOUT CONTRAST CT CERVICAL SPINE WITHOUT CONTRAST TECHNIQUE: Multidetector CT imaging of the head, cervical spine, and maxillofacial structures were performed using the standard protocol without intravenous contrast. Multiplanar CT image reconstructions of the cervical spine and maxillofacial structures were also generated. COMPARISON:  09/24/2017 FINDINGS: CT HEAD FINDINGS Brain: No evidence of acute infarction, hemorrhage, hydrocephalus, extra-axial collection or mass lesion/mass effect. Vascular: No hyperdense vessel or unexpected calcification. Skull: Normal. Negative for fracture or focal lesion. Other: None. CT MAXILLOFACIAL FINDINGS Osseous: No fracture or mandibular dislocation. No destructive process. Orbits: Negative. No traumatic or inflammatory finding. Sinuses: Clear. Soft tissues: Negative. CT CERVICAL SPINE FINDINGS Alignment: Normal. Skull base and vertebrae: No acute fracture. No primary bone lesion or focal pathologic process. Soft tissues and spinal canal: No prevertebral fluid or swelling. No visible canal hematoma. Disc levels: Mild loss of disc height at C4-C5 and C5-C6. Small endplate osteophytes at these levels. Minor disc bulging. No convincing disc herniation. There are facet degenerative changes most evident bilaterally at C5-C6. Upper chest: No acute findings. No masses or adenopathy. Mild centrilobular emphysema and scarring at the apices of the lungs. Other: None. IMPRESSION: HEAD CT 1. No intracranial abnormality. 2. No skull fracture. MAXILLOFACIAL CT 1. No fracture or acute finding. CERVICAL CT 1. No fracture or acute finding. Electronically Signed   By: Lajean Manes M.D.   On: 03/12/2019 04:39   Ct Lumbar Spine Wo Contrast  Result Date: 03/12/2019 CLINICAL DATA:  Per ed notes: Pt BIB [facility] from home alone. Pt was changing her thermostat when she lost her balance and fell. Pt has hx of back surgery on May  29 EXAM: CT LUMBAR SPINE WITHOUT CONTRAST TECHNIQUE: Multidetector CT imaging of the lumbar spine was performed without intravenous contrast administration. Multiplanar CT image reconstructions were also generated. COMPARISON:  CT, 02/15/2019 FINDINGS: Segmentation: 5 lumbar type vertebrae. Alignment: Normal. Vertebrae: Choose 1 Paraspinal and other soft tissues: No masses or adenopathy. Chronic bilateral perinephric stranding similar to the prior CT. Dense aortic atherosclerotic calcifications. Disc levels: T12-L1 through L2-L3: No disc bulging or herniation. No stenosis. L3-L4: Mild spondylotic disc bulging. No disc herniation. No significant stenosis. L4-L5: Bilateral pedicle screws and interconnecting rods fuse L4-L5. The orthopedic hardware is well-seated and unchanged from the prior abdomen and pelvis CT. There is  a disc spacer and bone graft material within the L4-L5 disc interspace and along the posterior elements. This all appears stable from the prior study. L5-S1: Moderate loss of disc height. Mild facet degenerative change. Spondylotic disc bulging, but no disc herniation. Mild right neural foraminal narrowing. IMPRESSION: 1. No fracture or acute finding. 2. Status post posterior lumbar spine fusion at L4-L5. No evidence of disruption of the orthopedic hardware. No change from the study dated 02/15/2019. Electronically Signed   By: Lajean Manes M.D.   On: 03/12/2019 04:44   Mr Lumbar Spine W Wo Contrast  Result Date: 03/12/2019 CLINICAL DATA:  Patient status post L4-5 fusion 01/28/2019. Low back and left leg pain since the procedure. EXAM: MRI LUMBAR SPINE WITHOUT AND WITH CONTRAST TECHNIQUE: Multiplanar and multiecho pulse sequences of the lumbar spine were obtained without and with intravenous contrast. CONTRAST:  5 cc Gadavist IV. COMPARISON:  MRI lumbar spine 02/15/2019. CT lumbar spine this same day. FINDINGS: Segmentation:  Standard. Alignment:  Trace anterolisthesis L4 on L5.  Otherwise  maintained. Vertebrae: No fracture, evidence of discitis, or bone lesion. Status post L4-5 fusion as described below. Conus medullaris and cauda equina: Conus extends to the L1 level. Conus and cauda equina appear normal. Paraspinal and other soft tissues: Left renal cyst noted. Disc levels: T10-11 and T11-12 are imaged in the sagittal plane only and negative. T12-L1: Negative. L1-2: Mild facet degenerative change.  Otherwise negative. L2-3: Minimal disc bulge and mild-to-moderate facet arthropathy. No stenosis. L3-4: There is a disc bulge eccentric to the left and mild to moderate facet arthropathy. Dorsal epidural fat is prominent. Moderate compression of the thecal sac and left worse than right subarticular recess narrowing appear unchanged. Neural foramina are open. L4-5: Status post laminectomy and discectomy and fusion. Based on comparison with prior CT scan, acute or protruding into the disc interspace compatible with interbody graft and likely disc. Compression of the thecal sac seen on the prior MRI has markedly improved likely due to resolving postoperative edema. The central canal is now open. Mild to moderate bilateral foraminal narrowing appears greater on the left and unchanged. L5-S1: Bilateral facet degenerative change and a shallow disc bulge. The central canal is open. Marked right foraminal narrowing is unchanged. Left foramen is minimally narrowed. IMPRESSION: Status post L4-5 fusion. Compression of the thecal sac seen on the prior MRI has markedly improved since the prior examination likely due to resolving postoperative edema. Mild to moderate foraminal narrowing at this level appears greater on the left and not notably changed. No change in marked right foraminal narrowing at L5-S1. No change in moderate compression of the thecal sac at L3-4 due to dorsal epidural fat and disc bulge to the left. Left worse than right subarticular recess narrowing at this level is also unchanged. Electronically  Signed   By: Inge Rise M.D.   On: 03/12/2019 14:32   Dg Pelvis Portable  Result Date: 03/12/2019 CLINICAL DATA:  Initial evaluation for acute trauma, fall. EXAM: PORTABLE PELVIS 1-2 VIEWS COMPARISON:  None. FINDINGS: Curvilinear lucency extends through the right pubis symphysis, favored to reflect summation of overlying soft tissue shadow, although an acute nondisplaced fracture not entirely excluded. No pubic diastasis. Remainder the bony pelvis intact. SI joints approximated. No abnormality about the hips. Prior fusion noted at L4-5. Degenerative changes noted within the lower lumbar spine. Mild osteoarthritic changes about the hips. No visible soft tissue injury. Scattered vascular calcifications noted. IMPRESSION: 1. Curvilinear lucency extending through the right pubis symphysis,  favored to reflect summation of overlying soft tissue shadow, although an acute nondisplaced fracture not entirely excluded. Correlation with physical exam recommended. 2. No other acute osseous abnormality about the pelvis. Electronically Signed   By: Jeannine Boga M.D.   On: 03/12/2019 04:54   Mr Sacrum Si Joints Wo Contrast  Result Date: 03/14/2019 CLINICAL DATA:  Recent fall. Tailbone pain. Evaluate for sacral insufficiency fracture. EXAM: MRI SACRUM WITHOUT CONTRAST TECHNIQUE: Multiplanar, multisequence MR imaging of the sacrum was performed. No intravenous contrast was administered. COMPARISON:  Pelvic x-rays from yesterday. CT abdomen pelvis dated February 15, 2019. FINDINGS: Bones/Joint/Cartilage Acute minimally impacted subcapital right femoral neck fracture. No sacral ala fracture. No dislocation. No joint effusion. Prior L4-L5 fusion. Muscles and Tendons Grossly intact.  No muscle edema or atrophy. Soft tissue No fluid collection or hematoma. No soft tissue mass. Foley catheter in the bladder. IMPRESSION: 1. Acute minimally impacted subcapital right femoral neck fracture. Consider dedicated right hip x-rays  for further evaluation. 2. No additional acute osseous abnormality.  No sacral ala fracture. These results will be called to the ordering clinician or representative by the Radiologist Assistant, and communication documented in the PACS or zVision Dashboard. Electronically Signed   By: Titus Dubin M.D.   On: 03/14/2019 15:40   Dg Chest Portable 1 View  Result Date: 03/12/2019 CLINICAL DATA:  Initial evaluation for acute trauma, fall. EXAM: PORTABLE CHEST 1 VIEW COMPARISON:  Prior radiograph from 09/12/2018 FINDINGS: Transverse heart size at the upper limits of normal. Mediastinal silhouette within normal limits. Aortic atherosclerosis. Lungs mildly hypoinflated. No focal infiltrates. No edema or effusion. No pneumothorax. No acute osseous abnormality. Diffuse osteopenia. IMPRESSION: 1. No active cardiopulmonary disease. 2. Aortic atherosclerosis. Electronically Signed   By: Jeannine Boga M.D.   On: 03/12/2019 04:48   Dg C-arm 1-60 Min  Result Date: 03/15/2019 CLINICAL DATA:  RIGHT hip pinning. EXAM: DG C-ARM 61-120 MIN COMPARISON:  None. FINDINGS: Intraoperative fluoroscopic images demonstrate placement of 3 fixation screws traversing the RIGHT femoral neck. Fluoroscopy provided for 41 seconds. IMPRESSION: Fluoroscopy provided for 41 seconds. Electronically Signed   By: Franki Cabot M.D.   On: 03/15/2019 14:00   Dg Hip Operative Unilat W Or W/o Pelvis Right  Result Date: 03/15/2019 CLINICAL DATA:  Right hip pinning EXAM: OPERATIVE right HIP (WITH PELVIS IF PERFORMED) for VIEWS TECHNIQUE: Fluoroscopic spot image(s) were submitted for interpretation post-operatively. COMPARISON:  03/14/2019 FINDINGS: Images show placement of 3 Knowles type pins for fixation of a femoral neck fracture. No radiographically detectable complication. IMPRESSION: Knowles pin placement for fixation of a right femoral neck fracture. Electronically Signed   By: Nelson Chimes M.D.   On: 03/15/2019 15:34   Dg Hip Unilat  With Pelvis 2-3 Views Right  Result Date: 03/14/2019 CLINICAL DATA:  Fall several days ago with known right hip fracture EXAM: DG HIP (WITH OR WITHOUT PELVIS) 3V RIGHT COMPARISON:  03/12/2019 FINDINGS: Impacted right femoral neck fracture is seen similar to that noted on prior MRI. Postsurgical changes in the lumbar spine are seen. No other fracture is noted. IMPRESSION: Mildly impacted right femoral neck fracture. No other focal abnormality is seen. Electronically Signed   By: Inez Catalina M.D.   On: 03/14/2019 18:13   Ct Maxillofacial Wo Contrast  Result Date: 03/12/2019 CLINICAL DATA:  Pt BIB [facility] from home alone. Pt was changing her thermostat when she lost her balance and fell. Pt has hx of back surgery on May 29 EXAM: CT HEAD WITHOUT CONTRAST  CT MAXILLOFACIAL WITHOUT CONTRAST CT CERVICAL SPINE WITHOUT CONTRAST TECHNIQUE: Multidetector CT imaging of the head, cervical spine, and maxillofacial structures were performed using the standard protocol without intravenous contrast. Multiplanar CT image reconstructions of the cervical spine and maxillofacial structures were also generated. COMPARISON:  09/24/2017 FINDINGS: CT HEAD FINDINGS Brain: No evidence of acute infarction, hemorrhage, hydrocephalus, extra-axial collection or mass lesion/mass effect. Vascular: No hyperdense vessel or unexpected calcification. Skull: Normal. Negative for fracture or focal lesion. Other: None. CT MAXILLOFACIAL FINDINGS Osseous: No fracture or mandibular dislocation. No destructive process. Orbits: Negative. No traumatic or inflammatory finding. Sinuses: Clear. Soft tissues: Negative. CT CERVICAL SPINE FINDINGS Alignment: Normal. Skull base and vertebrae: No acute fracture. No primary bone lesion or focal pathologic process. Soft tissues and spinal canal: No prevertebral fluid or swelling. No visible canal hematoma. Disc levels: Mild loss of disc height at C4-C5 and C5-C6. Small endplate osteophytes at these levels. Minor disc  bulging. No convincing disc herniation. There are facet degenerative changes most evident bilaterally at C5-C6. Upper chest: No acute findings. No masses or adenopathy. Mild centrilobular emphysema and scarring at the apices of the lungs. Other: None. IMPRESSION: HEAD CT 1. No intracranial abnormality. 2. No skull fracture. MAXILLOFACIAL CT 1. No fracture or acute finding. CERVICAL CT 1. No fracture or acute finding. Electronically Signed   By: Lajean Manes M.D.   On: 03/12/2019 04:39       Subjective: She does feel anxious this morning.  Still having some back pain.  Discharge Exam: Vitals:   03/18/19 2142 03/18/19 2143 03/19/19 0058 03/19/19 0507  BP:  (!) 179/82 (!) 166/70 (!) 173/76  Pulse:  79 73 77  Resp:  12 10 18   Temp: 98.2 F (36.8 C)   98.3 F (36.8 C)  TempSrc: Oral   Oral  SpO2:  100% 98% 98%  Weight:      Height:  5\' 4"  (1.626 m)      General: Pt is alert, awake, not in acute distress Cardiovascular: RRR, S1/S2 +, no rubs, no gallops Respiratory: CTA bilaterally, no wheezing, no rhonchi Abdominal: Soft, NT, ND, bowel sounds + Extremities: no edema, no cyanosis    The results of significant diagnostics from this hospitalization (including imaging, microbiology, ancillary and laboratory) are listed below for reference.     Microbiology: Recent Results (from the past 240 hour(s))  SARS Coronavirus 2 (CEPHEID - Performed in [place] hospital lab), Hosp Order     Status: None   Collection Time: 03/12/19  6:17 AM   Specimen: Nasopharyngeal Swab  Result Value Ref Range Status   SARS Coronavirus 2 NEGATIVE NEGATIVE Final    Comment: (NOTE) If result is NEGATIVE SARS-CoV-2 target nucleic acids are NOT DETECTED. The SARS-CoV-2 RNA is generally detectable in upper and lower  respiratory specimens during the acute phase of infection. The lowest  concentration of SARS-CoV-2 viral copies this assay can detect is 250  copies / mL. A negative result does not preclude  SARS-CoV-2 infection  and should not be used as the sole basis for treatment or other  patient management decisions.  A negative result may occur with  improper specimen collection / handling, submission of specimen other  than nasopharyngeal swab, presence of viral mutation(s) within the  areas targeted by this assay, and inadequate number of viral copies  (<250 copies / mL). A negative result must be combined with clinical  observations, patient history, and epidemiological information. If result is POSITIVE SARS-CoV-2 target nucleic acids are  DETECTED. The SARS-CoV-2 RNA is generally detectable in upper and lower  respiratory specimens dur ing the acute phase of infection.  Positive  results are indicative of active infection with SARS-CoV-2.  Clinical  correlation with patient history and other diagnostic information is  necessary to determine patient infection status.  Positive results do  not rule out bacterial infection or co-infection with other viruses. If result is PRESUMPTIVE POSTIVE SARS-CoV-2 nucleic acids MAY BE PRESENT.   A presumptive positive result was obtained on the submitted specimen  and confirmed on repeat testing.  While 2019 novel coronavirus  (SARS-CoV-2) nucleic acids may be present in the submitted sample  additional confirmatory testing may be necessary for epidemiological  and / or clinical management purposes  to differentiate between  SARS-CoV-2 and other Sarbecovirus currently known to infect humans.  If clinically indicated additional testing with an alternate test  methodology [phone]) is advised. The SARS-CoV-2 RNA is generally  detectable in upper and lower respiratory sp ecimens during the acute  phase of infection. The expected result is Negative. Fact Sheet for Patients:  StrictlyIdeas.no Fact Sheet for Healthcare Providers: BankingDealers.co.za This test is not yet approved or cleared by the  [place] FDA and has been authorized for detection and/or diagnosis of SARS-CoV-2 by FDA under an Emergency Use Authorization (EUA).  This EUA will remain in effect (meaning this test can be used) for the duration of the COVID-19 declaration under Section 564(b)(1) of the Act, 21 U.S.C. section 360bbb-3(b)(1), unless the authorization is terminated or revoked sooner. Performed at [facility], [place] [address], [place], [place] [address]   Culture, Urine     Status: Abnormal   Collection Time: 03/12/19 11:42 AM   Specimen: Urine, Catheterized  Result Value Ref Range Status   Specimen Description URINE, CATHETERIZED  Final   Special Requests   Final    NONE Performed at [facility], [place] [address], [place], [place] [address]    Culture >=100,000 COLONIES/mL ESCHERICHIA COLI (A)  Final   Report Status 03/14/2019 FINAL  Final   Organism ID, Bacteria ESCHERICHIA COLI (A)  Final      Susceptibility   Escherichia coli - MIC*    AMPICILLIN <=2 SENSITIVE Sensitive     CEFAZOLIN <=4 SENSITIVE Sensitive     CEFTRIAXONE <=1 SENSITIVE Sensitive     CIPROFLOXACIN <=0.25 SENSITIVE Sensitive     GENTAMICIN <=1 SENSITIVE Sensitive     IMIPENEM <=0.25 SENSITIVE Sensitive     NITROFURANTOIN <=16 SENSITIVE Sensitive     TRIMETH/SULFA <=20 SENSITIVE Sensitive     AMPICILLIN/SULBACTAM <=2 SENSITIVE Sensitive     PIP/TAZO <=4 SENSITIVE Sensitive     Extended ESBL NEGATIVE Sensitive     * >=100,000 COLONIES/mL ESCHERICHIA COLI  Novel Coronavirus, NAA (hospital order; send-out to ref lab)     Status: None   Collection Time: 03/17/19  2:40 PM   Specimen: Nasopharyngeal Swab; Respiratory  Result Value Ref Range Status   SARS-CoV-2, NAA NOT DETECTED NOT DETECTED Final    Comment: (NOTE) This test was developed and its performance characteristics determined by [employer]. This test has not been FDA cleared or approved. This test has been authorized by FDA under an Emergency  Use Authorization (EUA). This test is only authorized for the duration of time the declaration that circumstances exist justifying the authorization of the emergency use of in vitro diagnostic tests for detection of SARS-CoV-2 virus and/or diagnosis of COVID-19 infection under section 564(b)(1) of  the Act, 21 U.S.C. 161WRU-0(A)(5), unless the authorization is terminated or revoked sooner. When diagnostic testing is negative, the possibility of a false negative result should be considered in the context of a patient's recent exposures and the presence of clinical signs and symptoms consistent with COVID-19. An individual without symptoms of COVID-19 and who is not shedding SARS-CoV-2 virus would expect to have a negative (not detected) result in this assay. Performed  At: [facility] [address] [place], [place] [id number] Rush Farmer MD NW:[id number]    [place]  Final    Comment: Performed at [facility], [place] [address] [place], [place] [address]     Labs: BNP (last 3 results) Recent Labs    02/19/19 0738  BNP 962.9*   Basic Metabolic Panel: Recent Labs  Lab 03/13/19 0246  03/15/19 0340 03/16/19 0322 03/17/19 0332 03/18/19 0243 03/19/19 0255  NA 118*   < > 128* 124* 126* 123* 127*  K 3.9   < > 4.9 4.7 4.6 4.0 3.9  CL 88*   < > 97* 92* 92* 90* 94*  CO2 20*   < > 23 22 21* 24 25  GLUCOSE 87   < > 111* 158* 111* 96 108*  BUN 14   < > 23 23 25* 20 17  CREATININE 1.03*   < > 0.98 1.04* 0.99 0.91 0.86  CALCIUM 8.1*   < > 9.0 9.0 9.0 8.4* 8.7*  MG 2.2  --   --   --   --   --   --    < > = values in this interval not displayed.   Liver Function Tests: No results for input(s): AST, ALT, ALKPHOS, BILITOT, PROT, ALBUMIN in the last 168 hours. No results for input(s): LIPASE, AMYLASE in the last 168 hours. No results for input(s): AMMONIA in the last 168 hours. CBC: Recent Labs  Lab 03/16/19 0322 03/17/19 0332 03/18/19 0243  03/18/19 1806 03/19/19 0255  WBC 8.6 8.0 6.4 7.5 7.7  HGB 8.3* 7.6* 7.0* 10.0* 9.7*  HCT 24.0* 21.5* 19.3* 28.1* 27.3*  MCV 105.3* 102.4* 101.0* 95.3 95.1  PLT 233 220 200 209 206   Cardiac Enzymes: No results for input(s): CKTOTAL, CKMB, CKMBINDEX, TROPONINI in the last 168 hours. BNP: Invalid input(s): POCBNP CBG: No results for input(s): GLUCAP in the last 168 hours. D-Dimer No results for input(s): DDIMER in the last 72 hours. Hgb A1c No results for input(s): HGBA1C in the last 72 hours. Lipid Profile No results for input(s): CHOL, HDL, LDLCALC, TRIG, CHOLHDL, LDLDIRECT in the last 72 hours. Thyroid function studies No results for input(s): TSH, T4TOTAL, T3FREE, THYROIDAB in the last 72 hours.  Invalid input(s): FREET3 Anemia work up No results for input(s): VITAMINB12, FOLATE, FERRITIN, TIBC, IRON, RETICCTPCT in the last 72 hours. Urinalysis    Component Value Date/Time   COLORURINE YELLOW 03/12/2019 1148   APPEARANCEUR CLEAR 03/12/2019 1148   LABSPEC 1.003 (L) 03/12/2019 1148   PHURINE 7.0 03/12/2019 1148   GLUCOSEU NEGATIVE 03/12/2019 1148   HGBUR NEGATIVE 03/12/2019 1148   BILIRUBINUR NEGATIVE 03/12/2019 1148   KETONESUR NEGATIVE 03/12/2019 1148   PROTEINUR 30 (A) 03/12/2019 1148   UROBILINOGEN 0.2 09/22/2012 1100   NITRITE POSITIVE (A) 03/12/2019 1148   LEUKOCYTESUR MODERATE (A) 03/12/2019 1148   Sepsis Labs Invalid input(s): PROCALCITONIN,  WBC,  LACTICIDVEN Microbiology Recent Results (from the past 240 hour(s))  SARS Coronavirus 2 (CEPHEID - Performed in [place] hospital lab), [facility]  Status: None   Collection Time: 03/12/19  6:17 AM   Specimen: Nasopharyngeal Swab  Result Value Ref Range Status   SARS Coronavirus 2 NEGATIVE NEGATIVE Final    Comment: (NOTE) If result is NEGATIVE SARS-CoV-2 target nucleic acids are NOT DETECTED. The SARS-CoV-2 RNA is generally detectable in upper and lower  respiratory specimens during the acute phase of  infection. The lowest  concentration of SARS-CoV-2 viral copies this assay can detect is 250  copies / mL. A negative result does not preclude SARS-CoV-2 infection  and should not be used as the sole basis for treatment or other  patient management decisions.  A negative result may occur with  improper specimen collection / handling, submission of specimen other  than nasopharyngeal swab, presence of viral mutation(s) within the  areas targeted by this assay, and inadequate number of viral copies  (<250 copies / mL). A negative result must be combined with clinical  observations, patient history, and epidemiological information. If result is POSITIVE SARS-CoV-2 target nucleic acids are DETECTED. The SARS-CoV-2 RNA is generally detectable in upper and lower  respiratory specimens dur ing the acute phase of infection.  Positive  results are indicative of active infection with SARS-CoV-2.  Clinical  correlation with patient history and other diagnostic information is  necessary to determine patient infection status.  Positive results do  not rule out bacterial infection or co-infection with other viruses. If result is PRESUMPTIVE POSTIVE SARS-CoV-2 nucleic acids MAY BE PRESENT.   A presumptive positive result was obtained on the submitted specimen  and confirmed on repeat testing.  While 2019 novel coronavirus  (SARS-CoV-2) nucleic acids may be present in the submitted sample  additional confirmatory testing may be necessary for epidemiological  and / or clinical management purposes  to differentiate between  SARS-CoV-2 and other Sarbecovirus currently known to infect humans.  If clinically indicated additional testing with an alternate test  methodology [phone]) is advised. The SARS-CoV-2 RNA is generally  detectable in upper and lower respiratory sp ecimens during the acute  phase of infection. The expected result is Negative. Fact Sheet for Patients:   StrictlyIdeas.no Fact Sheet for Healthcare Providers: BankingDealers.co.za This test is not yet approved or cleared by the [place] FDA and has been authorized for detection and/or diagnosis of SARS-CoV-2 by FDA under an Emergency Use Authorization (EUA).  This EUA will remain in effect (meaning this test can be used) for the duration of the COVID-19 declaration under Section 564(b)(1) of the Act, 21 U.S.C. section 360bbb-3(b)(1), unless the authorization is terminated or revoked sooner. Performed at [facility], [place] [address] [place], [place] [address]   Culture, Urine     Status: Abnormal   Collection Time: 03/12/19 11:42 AM   Specimen: Urine, Catheterized  Result Value Ref Range Status   Specimen Description URINE, CATHETERIZED  Final   Special Requests   Final    NONE Performed at [facility], [place] [address], [place], [place] [address]    Culture >=100,000 COLONIES/mL ESCHERICHIA COLI (A)  Final   Report Status 03/14/2019 FINAL  Final   Organism ID, Bacteria ESCHERICHIA COLI (A)  Final      Susceptibility   Escherichia coli - MIC*    AMPICILLIN <=2 SENSITIVE Sensitive     CEFAZOLIN <=4 SENSITIVE Sensitive     CEFTRIAXONE <=1 SENSITIVE Sensitive     CIPROFLOXACIN <=0.25 SENSITIVE Sensitive     GENTAMICIN <=1 SENSITIVE Sensitive     IMIPENEM <=0.25  SENSITIVE Sensitive     NITROFURANTOIN <=16 SENSITIVE Sensitive     TRIMETH/SULFA <=20 SENSITIVE Sensitive     AMPICILLIN/SULBACTAM <=2 SENSITIVE Sensitive     PIP/TAZO <=4 SENSITIVE Sensitive     Extended ESBL NEGATIVE Sensitive     * >=100,000 COLONIES/mL ESCHERICHIA COLI  Novel Coronavirus, NAA (hospital order; send-out to ref lab)     Status: None   Collection Time: 03/17/19  2:40 PM   Specimen: Nasopharyngeal Swab; Respiratory  Result Value Ref Range Status   SARS-CoV-2, NAA NOT DETECTED NOT DETECTED Final    Comment: (NOTE) This test was developed and  its performance characteristics determined by [employer]. This test has not been FDA cleared or approved. This test has been authorized by FDA under an Emergency Use Authorization (EUA). This test is only authorized for the duration of time the declaration that circumstances exist justifying the authorization of the emergency use of in vitro diagnostic tests for detection of SARS-CoV-2 virus and/or diagnosis of COVID-19 infection under section 564(b)(1) of the Act, 21 U.S.C. 292KMQ-2(M)(6), unless the authorization is terminated or revoked sooner. When diagnostic testing is negative, the possibility of a false negative result should be considered in the context of a patient's recent exposures and the presence of clinical signs and symptoms consistent with COVID-19. An individual without symptoms of COVID-19 and who is not shedding SARS-CoV-2 virus would expect to have a negative (not detected) result in this assay. Performed  At: [facility] [address] [place], [place] [id number] Rush Farmer MD BX:[id number]    [place]  Final    Comment: Performed at [facility], [place] [address], [place], [place] [address]     Time coordinating discharge: 27mins  SIGNED:   Kathie Dike, MD  [facility] 03/19/2019, 11:13 AM   If 7PM-7AM, please contact night-coverage [email or web address]

## 2019-03-21 ENCOUNTER — Non-Acute Institutional Stay (SKILLED_NURSING_FACILITY): Payer: PPO | Admitting: Adult Health

## 2019-03-21 ENCOUNTER — Encounter: Payer: Self-pay | Admitting: Adult Health

## 2019-03-21 ENCOUNTER — Other Ambulatory Visit: Payer: Self-pay | Admitting: Adult Health

## 2019-03-21 DIAGNOSIS — R338 Other retention of urine: Secondary | ICD-10-CM | POA: Diagnosis not present

## 2019-03-21 DIAGNOSIS — M5417 Radiculopathy, lumbosacral region: Secondary | ICD-10-CM | POA: Diagnosis not present

## 2019-03-21 DIAGNOSIS — I10 Essential (primary) hypertension: Secondary | ICD-10-CM

## 2019-03-21 DIAGNOSIS — M069 Rheumatoid arthritis, unspecified: Secondary | ICD-10-CM | POA: Diagnosis not present

## 2019-03-21 DIAGNOSIS — F419 Anxiety disorder, unspecified: Secondary | ICD-10-CM

## 2019-03-21 DIAGNOSIS — D62 Acute posthemorrhagic anemia: Secondary | ICD-10-CM | POA: Diagnosis not present

## 2019-03-21 DIAGNOSIS — S72001S Fracture of unspecified part of neck of right femur, sequela: Secondary | ICD-10-CM | POA: Diagnosis not present

## 2019-03-21 DIAGNOSIS — E871 Hypo-osmolality and hyponatremia: Secondary | ICD-10-CM

## 2019-03-21 MED ORDER — HYDROCODONE-ACETAMINOPHEN 5-325 MG PO TABS: 1.0000 | ORAL_TABLET | Freq: Four times a day (QID) | ORAL | 0 refills | Status: AC | PRN

## 2019-03-21 NOTE — Progress Notes (Signed)
Location:  [facility] Room Number: 306/B Place of Service:  SNF (31) Provider:  Durenda Age, DNP, FNP-BC  Patient Care Team: Maurice Small, MD as PCP - General (Family Medicine) Jerline Pain, MD as PCP - Cardiology (Cardiology)  Extended Emergency Contact Information Primary Emergency Contact: Arlee Muslim Address: [address], [place] [address] Johnnette Litter of [place] Phone: [phone] Mobile Phone: [phone] Relation: Son Secondary Emergency Contact: Delmar Landau, [place] [address] [place] Phone: [phone] Relation: Son  Code Status:  Full Code  Goals of care: Advanced Directive information Advanced Directives 03/21/2019  Does Patient Have a Medical Advance Directive? Yes  Type of Advance Directive (No Data)  Does patient want to make changes to medical advance directive? No - Patient declined  Copy of [place] in Chart? -  Would patient like information on creating a medical advance directive? -  Pre-existing out of facility DNR order (yellow form or pink MOST form) -     Chief Complaint  Patient presents with   Hospitalization Follow-up    Hospitalization Follow Up Visit    HPI:  Pt is a 73 y.o. female seen today for an acute visit/follow-up hospitalization. She has been admitted to [facility] on 03/19/19 from a recent hospitalization, 7/11-7/18/20. She was admitted to the hospital for worsening low back pain after falling from home. Imaging showed right subcapital femur neck fracture for which she had surgical repair on 03/15/19. Her hgb dropped to 8.8 and was transfused 1 unit packed RBC. Re-check of hgb on 7/18 showed improvement of hgb 9.7. Of note, she has a history of spinal stenosis for which she underwent L4-L5 decompression and fusion on 5/29. She had a short-term rehabilitation in a skilled nursing facility and was  discharged on 6/04. She went to ER on 6/15 due to urinary retention and was discharged home with a foley catheter and plans for a urology follow up. She has PMH of chronic hyponatremia, COPD, depression, GERD and hypertension.      Past Medical History:  Diagnosis Date   AIN grade I    Anxiety    Bruises easily    Chronic diarrhea    Chronic hyponatremia    Colitis    COPD (chronic obstructive pulmonary disease) (HCC)    Depression    GERD (gastroesophageal reflux disease)    History of chronic bronchitis    History of vulvar dysplasia    serveral recurrency's    Hypertension    LBBB (left bundle branch block) W/ PROLONGED PR   CARDIOLOGSIT-  DR Johnsie Cancel-   LOV IN EPIC   Macrocytosis    Nocturia    Osteoporosis    Seronegative rheumatoid arthritis (HCC)    Short of breath on exertion    Smokers' cough (HCC)    Venous insufficiency of leg    , Edema   VIN III (vulvar intraepithelial neoplasia III)    RECURRENT -   Past Surgical History:  Procedure Laterality Date   APPENDECTOMY  1992   CARDIOVASCULAR STRESS TEST  06-23-2012  DR Jenkins Rouge   NORMAL LEXISCAN MYOVIEW/ EF 83%/  NO ISCHEMIA   CO2 LASER APPLICATION  76/22/6333   Procedure: CO2 LASER APPLICATION;  Surgeon: Janie Morning, MD PHD;  Location: [facility];  Service: Gynecology;  Laterality: N/A;  Laser Vaporization  CO2 LASER APPLICATION N/A 04/01/1913   Procedure: CO2 LASER APPLICATION;  Surgeon: [name], MD;  Location: [facility];  Service: Gynecology;  Laterality: N/A;   CO2 LASER APPLICATION N/A 7/82/9562   Procedure: CO2 LASER APPLICATION of the vulva;  Surgeon: Janie Morning, MD;  Location: [facility];  Service: Gynecology;  Laterality: N/A;   EXCISION VULVAR LESIONS  07/2012   HEMORRHOID SURGERY N/A 04/15/2013   Procedure: Excision of external and internal  hemorrhoid with AIN, Exam under anesthesia;  Surgeon: Odis Hollingshead, MD;   Location: [facility];  Service: General;  Laterality: N/A;   HIP PINNING,CANNULATED Right 03/15/2019   Procedure: Maebelle Munroe OF FEMORAL NECK FRACTURE;  Surgeon: Renette Butters, MD;  Location: [facility];  Service: Orthopedics;  Laterality: Right;   REPAIR FISTULA IN ANO/  I & D PERIRECTAL ABSCESS  10-26-2002  DR Johnathan Hausen   TRANSTHORACIC ECHOCARDIOGRAM  06-23-2012   NORMAL LVSF/ EF 55-60%   VAGINAL HYSTERECTOMY  1997   partial   VULVAR LESION REMOVAL N/A 11/03/2014   Procedure: CO2 LASER OF VULVAR ;  Surgeon: [name], MD;  Location: [facility];  Service: Gynecology;  Laterality: N/A;   VULVECTOMY N/A 03/26/2015   Procedure: WIDE LOCA  EXCISION OF VULVAR;  Surgeon: [name], MD;  Location: [facility];  Service: Gynecology;  Laterality: N/A;   Wide Local Excision of labia majora  04-04-2010   Right-sided lesion, CO2 ablation of right labia minora    Allergies  Allergen Reactions   Latex Swelling and Other (See Comments)     fever blisters   Augmentin [Amoxicillin-Pot Clavulanate] Nausea And Vomiting   Doxycycline Other (See Comments)    Blurred vision, nervous, unsteady   Lactose Intolerance (Gi) Other (See Comments)    Unknown, per pt   Lialda [Mesalamine] Diarrhea   Prinivil [Lisinopril] Diarrhea   Triamcinolone Acetonide Other (See Comments)    REDNESS AND PAIN   Amlodipine Rash   Lotensin [Benazepril Hcl] Rash    Outpatient Encounter Medications as of 03/21/2019  Medication Sig   acetaminophen (TYLENOL) 325 MG tablet Take 2 tablets (650 mg total) by mouth every 6 (six) hours.   alendronate (FOSAMAX) 70 MG tablet Take 70 mg by mouth every Monday.    atorvastatin (LIPITOR) 40 MG tablet Take 1 tablet by mouth once daily   bisacodyl (DULCOLAX) 10 MG suppository If not relieved by MOM, give 10 mg Bisacodyl suppositiory rectally X 1 dose in 24 hours as needed (Do not use constipation standing orders for residents with renal  failure/CFR less than 30. Contact MD for orders) (Physician Order)   busPIRone (BUSPAR) 30 MG tablet Take 30 mg by mouth 2 (two) times daily.    Calcium Carbonate-Vitamin D (CALTRATE 600+D PO) Take 1 tablet by mouth daily.    carvedilol (COREG) 6.25 MG tablet Take 1 tablet (6.25 mg total) by mouth 2 (two) times daily with a meal.   Cholecalciferol (VITAMIN D3) 1000 UNITS CAPS Take 1,000 Units by mouth daily.    clonazePAM (KLONOPIN) 0.5 MG tablet Take 1 tablet (0.5 mg total) by mouth 3 (three) times daily as needed for anxiety.   diltiazem (TIAZAC) 360 MG 24 hr capsule Take 360 mg by mouth daily.    docusate sodium (COLACE) 100 MG capsule Take 1 capsule (100 mg total) by mouth 2 (two) times daily.   enoxaparin (LOVENOX) 40 MG/0.4ML injection Inject 0.4 mLs (40 mg total) into the skin daily for  30 doses. For DVT prophylaxis   hydrALAZINE (APRESOLINE) 25 MG tablet Take 3 tablets (75 mg total) by mouth 3 (three) times daily.   HYDROcodone-acetaminophen (NORCO/VICODIN) 5-325 MG tablet Take 1 tablet by mouth every 6 (six) hours as needed for moderate pain. for pain x 5 days   hydroxychloroquine (PLAQUENIL) 200 MG tablet Take 200 mg by mouth daily.   magnesium hydroxide (MILK OF MAGNESIA) 400 MG/5ML suspension If no BM in 3 days, give 30 cc Milk of Magnesium p.o. x 1 dose in 24 hours as needed (Do not use standing constipation orders for residents with renal failure CFR less than 30. Contact MD for orders) (Physician Order)   methocarbamol (ROBAXIN) 500 MG tablet Take 1 tablet (500 mg total) by mouth every 8 (eight) hours as needed for muscle spasms.   polyethylene glycol (MIRALAX / GLYCOLAX) 17 g packet Take 17 g by mouth daily as needed for mild constipation.   predniSONE (DELTASONE) 5 MG tablet Take 5 mg by mouth daily with breakfast. --  LONG-TERM USE FOR RA   senna-docusate (SENOKOT-S) 8.6-50 MG tablet Take 1 tablet by mouth 2 (two) times daily.   sodium chloride 1 g tablet Take 1  tablet (1 g total) by mouth 2 (two) times daily with a meal.   Sodium Phosphates (RA SALINE ENEMA RE) If not relieved by Biscodyl suppository, give disposable Saline Enema rectally X 1 dose/24 hrs as needed (Do not use constipation standing orders for residents with renal failure/CFR less than 30. Contact MD for orders)(Physician Or   No facility-administered encounter medications on file as of 03/21/2019.     Review of Systems  GENERAL: No change in appetite, no fatigue, no weight changes, no fever, chills or weakness MOUTH and THROAT: Denies oral discomfort, gingival pain or bleeding RESPIRATORY: no cough, SOB, DOE, wheezing, hemoptysis CARDIAC: No chest pain, edema or palpitations GI: No abdominal pain, diarrhea, constipation, heart burn, nausea or vomiting NEUROLOGICAL: Denies dizziness, syncope, numbness, or headache PSYCHIATRIC: Denies feelings of depression or anxiety. No report of hallucinations, insomnia, paranoia, or agitation   Immunization History  Administered Date(s) Administered   Tdap 09/11/2018   Pertinent  Health Maintenance Due  Topic Date Due   COLONOSCOPY  04/21/2019 (Originally 02/23/1995)   PNA vac Low Risk Adult (1 of 2 - PCV13) 04/21/2019 (Originally 02/22/2010)   INFLUENZA VACCINE  04/02/2019   MAMMOGRAM  08/12/2020   DEXA SCAN  Completed   No flowsheet data found.   Vitals:   03/21/19 1027  BP: (!) 123/43  Pulse: 76  Resp: 16  Temp: 98.8 F (37.1 C)  TempSrc: Oral  SpO2: 99%  Weight: 104 lb 14.4 oz (47.6 kg)  Height: 5\' 4"  (1.626 m)   Body mass index is 18.01 kg/m.  Physical Exam  GENERAL APPEARANCE:  In no acute distress.  SKIN:  Right femur surgical site with dressing, no erythema nor discharge noted, bilateral elbows with dressing MOUTH and THROAT: Lips are without lesions. Oral mucosa is moist and without lesions.  RESPIRATORY: Breathing is even & unlabored, BS CTAB CARDIAC: RRR, no murmur,no extra heart sounds, no edema GI:  Abdomen soft, normal BS, no masses, no tenderness GU:  Has foley catheter draining to urine bagwi EXTREMITIES:  Able to move X 4 extremities NEUROLOGICAL: There is no tremor. Speech is clear. Alert and oriented X 3. PSYCHIATRIC:  Affect and behavior are appropriate  Labs reviewed: Recent Labs    02/21/19 0559 02/22/19 0630  03/13/19 0246  03/17/19 0076  03/18/19 0243 03/19/19 0255  NA 125* 126*   < > 118*   < > 126* 123* 127*  K 4.4 3.8   < > 3.9   < > 4.6 4.0 3.9  CL 99 97*   < > 88*   < > 92* 90* 94*  CO2 21* 21*   < > 20*   < > 21* 24 25  GLUCOSE 111* 86   < > 87   < > 111* 96 108*  BUN 24* 24*   < > 14   < > 25* 20 17  CREATININE 1.15* 1.06*   < > 1.03*   < > 0.99 0.91 0.86  CALCIUM 8.3* 8.5*   < > 8.1*   < > 9.0 8.4* 8.7*  MG 1.8 1.8  --  2.2  --   --   --   --    < > = values in this interval not displayed.   No results for input(s): AST, ALT, ALKPHOS, BILITOT, PROT, ALBUMIN in the last 8760 hours. Recent Labs    01/28/19 1001  02/14/19 2059  03/12/19 0403  03/18/19 0243 03/18/19 1806 03/19/19 0255  WBC 9.6   < > 9.8   < > 11.9*   < > 6.4 7.5 7.7  NEUTROABS 7.5  --  8.5*  --  9.9*  --   --   --   --   HGB 12.3   < > 8.6*   < > 10.8*   < > 7.0* 10.0* 9.7*  HCT 34.6*   < > 24.2*   < > 29.8*   < > 19.3* 28.1* 27.3*  MCV 100.3*   < > 101.3*   < > 100.7*   < > 101.0* 95.3 95.1  PLT 190   < > 221   < > 225   < > 200 209 206   < > = values in this interval not displayed.   Lab Results  Component Value Date   TSH 0.944 02/19/2019   Lab Results  Component Value Date   HGBA1C 5.4 09/24/2017   Lab Results  Component Value Date   CHOL 143 09/24/2017   HDL 96 09/24/2017   LDLCALC 42 09/24/2017   TRIG 24 09/24/2017   CHOLHDL 1.5 09/24/2017    Significant Diagnostic Results in last 30 days:  Dg Pelvis 1-2 Views  Result Date: 03/13/2019 CLINICAL DATA:  Pelvic pain. Status post fall last night. Initial encounter. EXAM: PELVIS - 1-2 VIEW COMPARISON:  Single-view of  the pelvis 03/12/2019. CT abdomen and pelvis 02/15/2019. FINDINGS: Possible right parasymphyseal pubic bone fracture seen on yesterday's examination is not visible today. No acute bony or joint abnormality is identified. The patient is status post L4-5 fusion. IMPRESSION: No acute abnormality. Possible right parasymphyseal pubic bone fracture on yesterday's examination is likely due to overlapping shadows. Electronically Signed   By: Inge Rise M.D.   On: 03/13/2019 14:08   Dg Elbow Complete Left  Result Date: 03/12/2019 CLINICAL DATA:  Initial evaluation for acute trauma, fall. EXAM: LEFT ELBOW - COMPLETE 3+ VIEW COMPARISON:  None. FINDINGS: There is no evidence of fracture, dislocation, or joint effusion. There is no evidence of arthropathy or other focal bone abnormality. Soft tissues are unremarkable. IMPRESSION: No acute osseous abnormality about the left elbow. Electronically Signed   By: Jeannine Boga M.D.   On: 03/12/2019 04:50   Dg Elbow Complete Right  Result Date: 03/12/2019 CLINICAL DATA:  Initial evaluation for acute  trauma, fall. EXAM: RIGHT ELBOW - COMPLETE 3+ VIEW COMPARISON:  None. FINDINGS: No acute fracture dislocation. No joint effusion. Radial head intact. Mild degenerative spurring present about the elbow. No visible soft tissue injury. IMPRESSION: No acute osseous abnormality about the right elbow. Electronically Signed   By: Jeannine Boga M.D.   On: 03/12/2019 04:52   Ct Head Wo Contrast  Result Date: 03/12/2019 CLINICAL DATA:  Pt BIB [facility] from home alone. Pt was changing her thermostat when she lost her balance and fell. Pt has hx of back surgery on May 29 EXAM: CT HEAD WITHOUT CONTRAST CT MAXILLOFACIAL WITHOUT CONTRAST CT CERVICAL SPINE WITHOUT CONTRAST TECHNIQUE: Multidetector CT imaging of the head, cervical spine, and maxillofacial structures were performed using the standard protocol without intravenous contrast. Multiplanar CT image reconstructions of  the cervical spine and maxillofacial structures were also generated. COMPARISON:  09/24/2017 FINDINGS: CT HEAD FINDINGS Brain: No evidence of acute infarction, hemorrhage, hydrocephalus, extra-axial collection or mass lesion/mass effect. Vascular: No hyperdense vessel or unexpected calcification. Skull: Normal. Negative for fracture or focal lesion. Other: None. CT MAXILLOFACIAL FINDINGS Osseous: No fracture or mandibular dislocation. No destructive process. Orbits: Negative. No traumatic or inflammatory finding. Sinuses: Clear. Soft tissues: Negative. CT CERVICAL SPINE FINDINGS Alignment: Normal. Skull base and vertebrae: No acute fracture. No primary bone lesion or focal pathologic process. Soft tissues and spinal canal: No prevertebral fluid or swelling. No visible canal hematoma. Disc levels: Mild loss of disc height at C4-C5 and C5-C6. Small endplate osteophytes at these levels. Minor disc bulging. No convincing disc herniation. There are facet degenerative changes most evident bilaterally at C5-C6. Upper chest: No acute findings. No masses or adenopathy. Mild centrilobular emphysema and scarring at the apices of the lungs. Other: None. IMPRESSION: HEAD CT 1. No intracranial abnormality. 2. No skull fracture. MAXILLOFACIAL CT 1. No fracture or acute finding. CERVICAL CT 1. No fracture or acute finding. Electronically Signed   By: Lajean Manes M.D.   On: 03/12/2019 04:39   Ct Cervical Spine Wo Contrast  Result Date: 03/12/2019 CLINICAL DATA:  Pt BIB [facility] from home alone. Pt was changing her thermostat when she lost her balance and fell. Pt has hx of back surgery on May 29 EXAM: CT HEAD WITHOUT CONTRAST CT MAXILLOFACIAL WITHOUT CONTRAST CT CERVICAL SPINE WITHOUT CONTRAST TECHNIQUE: Multidetector CT imaging of the head, cervical spine, and maxillofacial structures were performed using the standard protocol without intravenous contrast. Multiplanar CT image reconstructions of the cervical spine and  maxillofacial structures were also generated. COMPARISON:  09/24/2017 FINDINGS: CT HEAD FINDINGS Brain: No evidence of acute infarction, hemorrhage, hydrocephalus, extra-axial collection or mass lesion/mass effect. Vascular: No hyperdense vessel or unexpected calcification. Skull: Normal. Negative for fracture or focal lesion. Other: None. CT MAXILLOFACIAL FINDINGS Osseous: No fracture or mandibular dislocation. No destructive process. Orbits: Negative. No traumatic or inflammatory finding. Sinuses: Clear. Soft tissues: Negative. CT CERVICAL SPINE FINDINGS Alignment: Normal. Skull base and vertebrae: No acute fracture. No primary bone lesion or focal pathologic process. Soft tissues and spinal canal: No prevertebral fluid or swelling. No visible canal hematoma. Disc levels: Mild loss of disc height at C4-C5 and C5-C6. Small endplate osteophytes at these levels. Minor disc bulging. No convincing disc herniation. There are facet degenerative changes most evident bilaterally at C5-C6. Upper chest: No acute findings. No masses or adenopathy. Mild centrilobular emphysema and scarring at the apices of the lungs. Other: None. IMPRESSION: HEAD CT 1. No intracranial abnormality. 2. No skull fracture. MAXILLOFACIAL CT 1.  No fracture or acute finding. CERVICAL CT 1. No fracture or acute finding. Electronically Signed   By: Lajean Manes M.D.   On: 03/12/2019 04:39   Ct Lumbar Spine Wo Contrast  Result Date: 03/12/2019 CLINICAL DATA:  Per ed notes: Pt BIB [facility] from home alone. Pt was changing her thermostat when she lost her balance and fell. Pt has hx of back surgery on May 29 EXAM: CT LUMBAR SPINE WITHOUT CONTRAST TECHNIQUE: Multidetector CT imaging of the lumbar spine was performed without intravenous contrast administration. Multiplanar CT image reconstructions were also generated. COMPARISON:  CT, 02/15/2019 FINDINGS: Segmentation: 5 lumbar type vertebrae. Alignment: Normal. Vertebrae: Choose 1 Paraspinal and other  soft tissues: No masses or adenopathy. Chronic bilateral perinephric stranding similar to the prior CT. Dense aortic atherosclerotic calcifications. Disc levels: T12-L1 through L2-L3: No disc bulging or herniation. No stenosis. L3-L4: Mild spondylotic disc bulging. No disc herniation. No significant stenosis. L4-L5: Bilateral pedicle screws and interconnecting rods fuse L4-L5. The orthopedic hardware is well-seated and unchanged from the prior abdomen and pelvis CT. There is a disc spacer and bone graft material within the L4-L5 disc interspace and along the posterior elements. This all appears stable from the prior study. L5-S1: Moderate loss of disc height. Mild facet degenerative change. Spondylotic disc bulging, but no disc herniation. Mild right neural foraminal narrowing. IMPRESSION: 1. No fracture or acute finding. 2. Status post posterior lumbar spine fusion at L4-L5. No evidence of disruption of the orthopedic hardware. No change from the study dated 02/15/2019. Electronically Signed   By: Lajean Manes M.D.   On: 03/12/2019 04:44   Mr Lumbar Spine W Wo Contrast  Result Date: 03/12/2019 CLINICAL DATA:  Patient status post L4-5 fusion 01/28/2019. Low back and left leg pain since the procedure. EXAM: MRI LUMBAR SPINE WITHOUT AND WITH CONTRAST TECHNIQUE: Multiplanar and multiecho pulse sequences of the lumbar spine were obtained without and with intravenous contrast. CONTRAST:  5 cc Gadavist IV. COMPARISON:  MRI lumbar spine 02/15/2019. CT lumbar spine this same day. FINDINGS: Segmentation:  Standard. Alignment:  Trace anterolisthesis L4 on L5.  Otherwise maintained. Vertebrae: No fracture, evidence of discitis, or bone lesion. Status post L4-5 fusion as described below. Conus medullaris and cauda equina: Conus extends to the L1 level. Conus and cauda equina appear normal. Paraspinal and other soft tissues: Left renal cyst noted. Disc levels: T10-11 and T11-12 are imaged in the sagittal plane only and  negative. T12-L1: Negative. L1-2: Mild facet degenerative change.  Otherwise negative. L2-3: Minimal disc bulge and mild-to-moderate facet arthropathy. No stenosis. L3-4: There is a disc bulge eccentric to the left and mild to moderate facet arthropathy. Dorsal epidural fat is prominent. Moderate compression of the thecal sac and left worse than right subarticular recess narrowing appear unchanged. Neural foramina are open. L4-5: Status post laminectomy and discectomy and fusion. Based on comparison with prior CT scan, acute or protruding into the disc interspace compatible with interbody graft and likely disc. Compression of the thecal sac seen on the prior MRI has markedly improved likely due to resolving postoperative edema. The central canal is now open. Mild to moderate bilateral foraminal narrowing appears greater on the left and unchanged. L5-S1: Bilateral facet degenerative change and a shallow disc bulge. The central canal is open. Marked right foraminal narrowing is unchanged. Left foramen is minimally narrowed. IMPRESSION: Status post L4-5 fusion. Compression of the thecal sac seen on the prior MRI has markedly improved since the prior examination likely due to resolving  postoperative edema. Mild to moderate foraminal narrowing at this level appears greater on the left and not notably changed. No change in marked right foraminal narrowing at L5-S1. No change in moderate compression of the thecal sac at L3-4 due to dorsal epidural fat and disc bulge to the left. Left worse than right subarticular recess narrowing at this level is also unchanged. Electronically Signed   By: Inge Rise M.D.   On: 03/12/2019 14:32   Dg Pelvis Portable  Result Date: 03/12/2019 CLINICAL DATA:  Initial evaluation for acute trauma, fall. EXAM: PORTABLE PELVIS 1-2 VIEWS COMPARISON:  None. FINDINGS: Curvilinear lucency extends through the right pubis symphysis, favored to reflect summation of overlying soft tissue shadow,  although an acute nondisplaced fracture not entirely excluded. No pubic diastasis. Remainder the bony pelvis intact. SI joints approximated. No abnormality about the hips. Prior fusion noted at L4-5. Degenerative changes noted within the lower lumbar spine. Mild osteoarthritic changes about the hips. No visible soft tissue injury. Scattered vascular calcifications noted. IMPRESSION: 1. Curvilinear lucency extending through the right pubis symphysis, favored to reflect summation of overlying soft tissue shadow, although an acute nondisplaced fracture not entirely excluded. Correlation with physical exam recommended. 2. No other acute osseous abnormality about the pelvis. Electronically Signed   By: Jeannine Boga M.D.   On: 03/12/2019 04:54   Mr Sacrum Si Joints Wo Contrast  Result Date: 03/14/2019 CLINICAL DATA:  Recent fall. Tailbone pain. Evaluate for sacral insufficiency fracture. EXAM: MRI SACRUM WITHOUT CONTRAST TECHNIQUE: Multiplanar, multisequence MR imaging of the sacrum was performed. No intravenous contrast was administered. COMPARISON:  Pelvic x-rays from yesterday. CT abdomen pelvis dated February 15, 2019. FINDINGS: Bones/Joint/Cartilage Acute minimally impacted subcapital right femoral neck fracture. No sacral ala fracture. No dislocation. No joint effusion. Prior L4-L5 fusion. Muscles and Tendons Grossly intact.  No muscle edema or atrophy. Soft tissue No fluid collection or hematoma. No soft tissue mass. Foley catheter in the bladder. IMPRESSION: 1. Acute minimally impacted subcapital right femoral neck fracture. Consider dedicated right hip x-rays for further evaluation. 2. No additional acute osseous abnormality.  No sacral ala fracture. These results will be called to the ordering clinician or representative by the Radiologist Assistant, and communication documented in the PACS or zVision Dashboard. Electronically Signed   By: Titus Dubin M.D.   On: 03/14/2019 15:40   Dg Chest Portable  1 View  Result Date: 03/12/2019 CLINICAL DATA:  Initial evaluation for acute trauma, fall. EXAM: PORTABLE CHEST 1 VIEW COMPARISON:  Prior radiograph from 09/12/2018 FINDINGS: Transverse heart size at the upper limits of normal. Mediastinal silhouette within normal limits. Aortic atherosclerosis. Lungs mildly hypoinflated. No focal infiltrates. No edema or effusion. No pneumothorax. No acute osseous abnormality. Diffuse osteopenia. IMPRESSION: 1. No active cardiopulmonary disease. 2. Aortic atherosclerosis. Electronically Signed   By: Jeannine Boga M.D.   On: 03/12/2019 04:48   Dg C-arm 1-60 Min  Result Date: 03/15/2019 CLINICAL DATA:  RIGHT hip pinning. EXAM: DG C-ARM 61-120 MIN COMPARISON:  None. FINDINGS: Intraoperative fluoroscopic images demonstrate placement of 3 fixation screws traversing the RIGHT femoral neck. Fluoroscopy provided for 41 seconds. IMPRESSION: Fluoroscopy provided for 41 seconds. Electronically Signed   By: Franki Cabot M.D.   On: 03/15/2019 14:00   Dg Hip Operative Unilat W Or W/o Pelvis Right  Result Date: 03/15/2019 CLINICAL DATA:  Right hip pinning EXAM: OPERATIVE right HIP (WITH PELVIS IF PERFORMED) for VIEWS TECHNIQUE: Fluoroscopic spot image(s) were submitted for interpretation post-operatively. COMPARISON:  03/14/2019 FINDINGS:  Images show placement of 3 Knowles type pins for fixation of a femoral neck fracture. No radiographically detectable complication. IMPRESSION: Knowles pin placement for fixation of a right femoral neck fracture. Electronically Signed   By: Nelson Chimes M.D.   On: 03/15/2019 15:34   Dg Hip Unilat With Pelvis 2-3 Views Right  Result Date: 03/14/2019 CLINICAL DATA:  Fall several days ago with known right hip fracture EXAM: DG HIP (WITH OR WITHOUT PELVIS) 3V RIGHT COMPARISON:  03/12/2019 FINDINGS: Impacted right femoral neck fracture is seen similar to that noted on prior MRI. Postsurgical changes in the lumbar spine are seen. No other fracture  is noted. IMPRESSION: Mildly impacted right femoral neck fracture. No other focal abnormality is seen. Electronically Signed   By: Inez Catalina M.D.   On: 03/14/2019 18:13   Ct Maxillofacial Wo Contrast  Result Date: 03/12/2019 CLINICAL DATA:  Pt BIB [facility] from home alone. Pt was changing her thermostat when she lost her balance and fell. Pt has hx of back surgery on May 29 EXAM: CT HEAD WITHOUT CONTRAST CT MAXILLOFACIAL WITHOUT CONTRAST CT CERVICAL SPINE WITHOUT CONTRAST TECHNIQUE: Multidetector CT imaging of the head, cervical spine, and maxillofacial structures were performed using the standard protocol without intravenous contrast. Multiplanar CT image reconstructions of the cervical spine and maxillofacial structures were also generated. COMPARISON:  09/24/2017 FINDINGS: CT HEAD FINDINGS Brain: No evidence of acute infarction, hemorrhage, hydrocephalus, extra-axial collection or mass lesion/mass effect. Vascular: No hyperdense vessel or unexpected calcification. Skull: Normal. Negative for fracture or focal lesion. Other: None. CT MAXILLOFACIAL FINDINGS Osseous: No fracture or mandibular dislocation. No destructive process. Orbits: Negative. No traumatic or inflammatory finding. Sinuses: Clear. Soft tissues: Negative. CT CERVICAL SPINE FINDINGS Alignment: Normal. Skull base and vertebrae: No acute fracture. No primary bone lesion or focal pathologic process. Soft tissues and spinal canal: No prevertebral fluid or swelling. No visible canal hematoma. Disc levels: Mild loss of disc height at C4-C5 and C5-C6. Small endplate osteophytes at these levels. Minor disc bulging. No convincing disc herniation. There are facet degenerative changes most evident bilaterally at C5-C6. Upper chest: No acute findings. No masses or adenopathy. Mild centrilobular emphysema and scarring at the apices of the lungs. Other: None. IMPRESSION: HEAD CT 1. No intracranial abnormality. 2. No skull fracture. MAXILLOFACIAL CT 1. No  fracture or acute finding. CERVICAL CT 1. No fracture or acute finding. Electronically Signed   By: Lajean Manes M.D.   On: 03/12/2019 04:39    Assessment/Plan  1. Closed fracture of neck of right femur, sequela - S/P pinning of femoral neck fracture on 03/15/2019, continue Lovenox 40 mg SQ daily x30 days for DVT prophylaxis, Norco 5-325 mg 1 tab every 6 hours PRN for 5 days, follow-up with orthopedics, Dr. Edmonia Lynch, in 2 weeks, RLE WBAT  2. Chronic hyponatremia -Continue sodium chloride 1 g twice a day  3. Acute blood loss anemia Lab Results  Component Value Date   HGB 9.7 (L) 03/19/2019  - S/P transfusion of 1 unit packed RBC, will re-check CBC in 1 week  4. Lumbosacral radiculopathy -S/P L4-L5 decompression and fusion on 5/29, continue PT and OT for therapeutic strengthening exercises  5. Acute urinary retention -Continue Foley catheter and follow-up with urology, Dr Nicolette Bang  6. Rheumatoid arthritis, involving unspecified site, unspecified rheumatoid factor presence (HCC) -Continue plan of 1 mg 1 tab daily and prednisone 5 mg 1 tab daily  7. Essential hypertension -Stable, continue Coreg 6.25 mg 1 tab twice a day,  hydralazine 25 mg 3 tabs = 75 mg 3 times a day and diltiazem 24-hour 260 mg 1 tab daily  8. Anxiety - Mood is stable, continue buspirone 30 mg 1 tab twice a day and - clonazePAM (KLONOPIN) 0.5 MG tablet; Take 1 tablet (0.5 mg total) by mouth 3 (three) times daily as needed for up to 14 days for anxiety.  Dispense: 30 tablet; Refill: 0   Family/ staff Communication:  Discussed plan of care with resident.  Labs/tests ordered:  CBC and BMP in 1 week  Goals of care:   Short-term care   Durenda Age, DNP, FNP-BC [facility] [phone] (Monday-Friday 8:00 a.m. - 5:00 p.m.) [phone] (after hours)

## 2019-03-22 ENCOUNTER — Non-Acute Institutional Stay (SKILLED_NURSING_FACILITY): Payer: PPO | Admitting: Internal Medicine

## 2019-03-22 ENCOUNTER — Encounter: Payer: Self-pay | Admitting: Internal Medicine

## 2019-03-22 DIAGNOSIS — D62 Acute posthemorrhagic anemia: Secondary | ICD-10-CM

## 2019-03-22 DIAGNOSIS — G834 Cauda equina syndrome: Secondary | ICD-10-CM

## 2019-03-22 DIAGNOSIS — E871 Hypo-osmolality and hyponatremia: Secondary | ICD-10-CM

## 2019-03-22 DIAGNOSIS — R9431 Abnormal electrocardiogram [ECG] [EKG]: Secondary | ICD-10-CM

## 2019-03-22 DIAGNOSIS — R338 Other retention of urine: Secondary | ICD-10-CM | POA: Diagnosis not present

## 2019-03-22 DIAGNOSIS — S72001S Fracture of unspecified part of neck of right femur, sequela: Secondary | ICD-10-CM

## 2019-03-22 DIAGNOSIS — F329 Major depressive disorder, single episode, unspecified: Secondary | ICD-10-CM

## 2019-03-22 MED ORDER — CLONAZEPAM 0.5 MG PO TABS: 0.5000 mg | ORAL_TABLET | Freq: Three times a day (TID) | ORAL | 0 refills | Status: DC | PRN

## 2019-03-22 NOTE — Patient Instructions (Addendum)
See assessment and plan under each diagnosis in the problem list and acutely for this visit Total time 52  minutes; greater than 50% of the visit spent counseling patient and coordinating care for problems addressed at this encounter  

## 2019-03-22 NOTE — Assessment & Plan Note (Signed)
03/22/2019 patient is profoundly depressed; SSRI & TCAs could be problematic. Psych NP consultation will be ordered.

## 2019-03-22 NOTE — Progress Notes (Signed)
NURSING HOME LOCATION:  [facility] ROOM NUMBER:  [id number]  CODE STATUS:  Full Code  PCP:  Maurice Small, MD  [address] [id number]  This is a comprehensive admission note to [facility] performed on this date less than 30 days from date of admission.  Included are preadmission medical/surgical history; reconciled medication list; family history; social history and comprehensive review of systems. Corrections and additions to the records were documented. Comprehensive physical exam was also performed. Additionally a clinical summary was entered for each active diagnosis pertinent to this admission in the Problem List to enhance continuity of care.  HPI: Her most recent hospitalization prior to admission here to the SNF was 7/11-7/18/2020.  That hospitalization is related to issues dating back to May of this year. The patient underwent L4-L5 decompression and fusion by Dr. Ellene Route on 01/28/2019.  She was discharged to another SNF on 6/4 and stayed there for approximately 12 days for rehab with PT/OT.   She returned to the ED 6/15 with urinary retention and was discharged with a Foley catheter. Urologic follow-up was to be scheduled, but to date has not been finalized. She was admitted emergently on 7/11 with worsening low back pain after falling.  Lab work documented a sodium of 118 and imaging revealed a right subcapital femur neck fracture. The hyponatremia has been a chronic issue and baseline sodium has been approximately 125.  Cortisol was low in the context of chronic steroid administration for rheumatoid arthritis.  She is also on hydroxychloroquine for RA.   Urine sodium was less than 10.  Osmolality was low at 100.  Clinically it was suspect that her hyponatremia was related to a "tea and toast" diet.  Sodium did not improve with fluid restriction prompting the initiation of salt tablets. Right hip pinning was completed 7/14 by Dr. Edmonia Lynch  for the right subcapital femur neck fracture.  Surgery resulted in acute blood loss anemia for which she received 1 unit of packed red cells.  Lovenox prophylaxis was to be continued until Thursday, 04/14/2019. Dr. Ellene Route consulted in reference to her low back pain; MRI revealed improvement in the prior compression of the thecal sac at L4-L5. Mild leukocytosis on admission with possible UTI versus colonization was treated with ceftriaxone with transition to oral Keflex.Urology follow-up is to be completed in late July.  Past medical and surgical history: Includes COPD, essential hypertension, chronic diastolic heart failure, anxiety, osteoporosis, history of vulvar intraepithelial neoplasm, and GERD. Procedures include CO2 laser ablation of the vulvar lesions as well as excision of such.  In addition to vulvectomy she has had a vaginal hysterectomy.  Social history: Social drinker; 5 decade smoker, smoking 4-6 cigarettes/day.  She states she quit smoking within the last 2 months.  Family history: Reviewed.  She witnessed her husband commit suicide 17 months ago.   Review of systems: She has been seeing the Hospice counselor since her husband's death.  This has been of significant value.  She is very depressed about being in quarantine and anxious to go home and be with her pet dog.  She is also very depressed about not being able to have the Foley catheter removed. She states that her emphysema symptoms are stable.  She describes occasional numbness in the left foot.  She also has intermittent loose stools. She categorically denies any neurologic or cardiologic prodrome prior to her fall.  She states that her walker simply tilted, causing her to fall.  She denies frequent falls.  Constitutional: No fever  Eyes: No redness, discharge, pain, vision change ENT/mouth: No nasal congestion, purulent discharge, earache, change in hearing, sore throat  Cardiovascular: No chest pain, palpitations, paroxysmal  nocturnal dyspnea, claudication, edema  Respiratory: No active cough, sputum production, hemoptysis, significant snoring, apnea  Gastrointestinal: No heartburn, dysphagia, abdominal pain, nausea /vomiting, rectal bleeding, melena Genitourinary: No dysuria, hematuria, pyuria, incontinence, nocturia Dermatologic: No rash, pruritus, change in appearance of skin Neurologic: No dizziness, headache, syncope, seizures Endocrine: No change in hair/skin/nails, excessive thirst, excessive hunger, excessive urination  Hematologic/lymphatic: No significant bruising, lymphadenopathy, abnormal bleeding Allergy/immunology: No itchy/watery eyes, significant sneezing, urticaria, angioedema  Physical exam:  Pertinent or positive findings: She appears cachectic and chronically ill.  Ptosis is present on the left greater than the right.  There is a faint resolving area of ecchymosis over the right temple.  There is a darker area of ecchymosis in the right periorbital area.  She has an upper plate.  Breath sounds are markedly distant and decreased.  Heart sounds are also distant.  A grade 5/9-4 systolic murmur is noted at left sternal border.  Abdomen is protuberant.  Foley catheter is present.  Pedal pulses are surprisingly strong.  Her limbs reveal atrophy, especially the legs.  Despite this strength opposition was surprisingly good in the lower extremities.  She is weaker in the upper extremities.  She has extensive scarring and bruising with the bruising greater on the left upper extremity than the right. She has scattered bruising over the legs as well.  Clubbing of the nailbeds is noted.  General appearance: no acute distress, increased work of breathing is present.   Lymphatic: No lymphadenopathy about the head, neck, axilla. Eyes: No conjunctival inflammation or lid edema is present. There is no scleral icterus. Ears:  External ear exam shows no significant lesions or deformities.   Nose:  External nasal  examination shows no deformity or inflammation. Nasal mucosa are pink and moist without lesions, exudates Oral exam: Lips and gums are healthy appearing.There is no oropharyngeal erythema or exudate. Neck:  No thyromegaly, masses, tenderness noted.    Heart:  Normal rate and regular rhythm. S1 and S2 normal without gallop,  click, rub.  Lungs:  without wheezes, rhonchi, rales, rubs. Abdomen: Bowel sounds are normal.  Abdomen is soft and nontender with no organomegaly, hernias, masses. GU: Deferred  Extremities:  No cyanosis, edema. Neurologic exam:  Balance, Rhomberg, finger to nose testing could not be completed due to clinical state Skin: Warm & dry. No significant  rash.  See clinical summary under each active problem in the Problem List with associated updated therapeutic plan

## 2019-03-22 NOTE — Assessment & Plan Note (Signed)
PT/OT at SNF °

## 2019-03-22 NOTE — Assessment & Plan Note (Signed)
Copy of this report will be sent to Hospice

## 2019-03-23 LAB — BASIC METABOLIC PANEL
BUN: 13 (ref 4–21)
Creatinine: 0.8 (ref 0.5–1.1)
Glucose: 92
Potassium: 3.7 (ref 3.4–5.3)
Sodium: 129 — AB (ref 137–147)

## 2019-03-23 LAB — CBC AND DIFFERENTIAL
HCT: 25 — AB (ref 36–46)
Hemoglobin: 9.2 — AB (ref 12.0–16.0)
Platelets: 196 (ref 150–399)
WBC: 5.1

## 2019-03-23 NOTE — Assessment & Plan Note (Signed)
Monitor CBC 

## 2019-03-23 NOTE — Assessment & Plan Note (Signed)
Copy 03/22/2019 note to [facility]

## 2019-03-23 NOTE — Assessment & Plan Note (Signed)
Consider addition of mineral corticoid  Exacerbation of multiple co-morbidities may have induced acute adrenal insufficiency

## 2019-03-24 ENCOUNTER — Non-Acute Institutional Stay (SKILLED_NURSING_FACILITY): Payer: PPO | Admitting: Adult Health

## 2019-03-24 ENCOUNTER — Encounter: Payer: Self-pay | Admitting: Adult Health

## 2019-03-24 DIAGNOSIS — F419 Anxiety disorder, unspecified: Secondary | ICD-10-CM

## 2019-03-24 DIAGNOSIS — R339 Retention of urine, unspecified: Secondary | ICD-10-CM | POA: Diagnosis not present

## 2019-03-24 DIAGNOSIS — Z720 Tobacco use: Secondary | ICD-10-CM | POA: Diagnosis not present

## 2019-03-24 NOTE — Progress Notes (Addendum)
Location:  [facility] Room Number: 306/B Place of Service:  SNF (31) Provider:  Durenda Age, DNP, FNP-BC  Patient Care Team: Maurice Small, MD as PCP - General (Family Medicine) Jerline Pain, MD as PCP - Cardiology (Cardiology)  Extended Emergency Contact Information Primary Emergency Contact: Arlee Muslim Address: [address], [place] [address] [place] of [place] Phone: [phone] Mobile Phone: [phone] Relation: Son Secondary Emergency Contact: Delmar Landau,  [address] [place] of [place] Phone: [phone] Relation: Son  Code Status:  Full Code  Goals of care: Advanced Directive information Advanced Directives 03/24/2019  Does Patient Have a Medical Advance Directive? Yes  Type of Advance Directive (No Data)  Does patient want to make changes to medical advance directive? No - Patient declined  Copy of [place] in Chart? -  Would patient like information on creating a medical advance directive? -  Pre-existing out of facility DNR order (yellow form or pink MOST form) -     Chief Complaint  Patient presents with   Acute Visit    Anxiety    HPI:  Pt is a 73 y.o. female seen today for an acute visit regarding her anxiety. She has Klonopin PRN but charge nurse reported that she has been asking for it regularly. She was supposed to have gone to a urology appointment yesterday but she called and cancelled it. She was admitted to [facility] with a foley catheter due to urinary retention. It was noted to be on the floor yesterday and she claimed that she has pulled it out. Last night, she got out of her room yelling. She was seen in her room today. She said,"This quarantine is not good for me. I can't stay in this room. I got frustrated last night when I was asking for help so I got out and yelled for help." She reported smoking 4-6  cigarettes/da and has reported quitting within the last 2 months.She said that she is able to urinate frequently. She denies dysuria nor hematuria. She talked about her husband who committed suicide by self inflicted gunshot to his head. She was  Crying while talking about her husband. She said that whenever she hears water flowing, it reminds her of the blood coming out of her husband's head.  She has been admitted to [facility] on 03/19/19 S/P sustaining right subcapital femur neck fracture for which she had a surgical repair on 03/15/19. She has PMH of chronic hyponatremia, COPD, depression, GERD and hypertension. She has been admitted for a short-term rehabilitation.   Past Medical History:  Diagnosis Date   AIN grade I    Anxiety    Bruises easily    Chronic diarrhea    Chronic hyponatremia    Colitis    COPD (chronic obstructive pulmonary disease) (HCC)    Depression    GERD (gastroesophageal reflux disease)    History of chronic bronchitis    History of vulvar dysplasia    serveral recurrency's    Hypertension    LBBB (left bundle branch block) W/ PROLONGED PR   CARDIOLOGSIT-  DR Johnsie Cancel-   LOV IN EPIC   Macrocytosis    Nocturia    Osteoporosis    Seronegative rheumatoid arthritis (HCC)    Short of breath on exertion    Smokers' cough ([facility])  Venous insufficiency of leg    , Edema   VIN III (vulvar intraepithelial neoplasia III)    RECURRENT -   Past Surgical History:  Procedure Laterality Date   APPENDECTOMY  1992   CARDIOVASCULAR STRESS TEST  06-23-2012  DR Jenkins Rouge   NORMAL LEXISCAN MYOVIEW/ EF 83%/  NO ISCHEMIA   CO2 LASER APPLICATION  32/67/1245   Procedure: CO2 LASER APPLICATION;  Surgeon: Janie Morning, MD PHD;  Location: [facility];  Service: Gynecology;  Laterality: N/A;  Laser Vaporization   CO2 LASER APPLICATION N/A 8/0/9983   Procedure: CO2 LASER APPLICATION;  Surgeon: [name], MD;   Location[facility];  Service: Gynecology;  Laterality: N/A;   CO2 LASER APPLICATION N/A 3/82/5053   Procedure: CO2 LASER APPLICATION of the vulva;  Surgeon: Janie Morning, MD;  Location: [facility];  Service: Gynecology;  Laterality: N/A;   EXCISION VULVAR LESIONS  07/2012   HEMORRHOID SURGERY N/A 04/15/2013   Procedure: Excision of external and internal  hemorrhoid with AIN, Exam under anesthesia;  Surgeon: Odis Hollingshead, MD;  Location: [facility];  Service: General;  Laterality: N/A;   HIP PINNING,CANNULATED Right 03/15/2019   Procedure: Maebelle Munroe OF FEMORAL NECK FRACTURE;  Surgeon: Renette Butters, MD;  Location: [facility];  Service: Orthopedics;  Laterality: Right;   REPAIR FISTULA IN ANO/  I & D PERIRECTAL ABSCESS  10-26-2002  DR Johnathan Hausen   TRANSTHORACIC ECHOCARDIOGRAM  06-23-2012   NORMAL LVSF/ EF 55-60%   VAGINAL HYSTERECTOMY  1997   partial   VULVAR LESION REMOVAL N/A 11/03/2014   Procedure: CO2 LASER OF VULVAR ;  Surgeon: [name], MD;  Location: [facility];  Service: Gynecology;  Laterality: N/A;   VULVECTOMY N/A 03/26/2015   Procedure: WIDE LOCA  EXCISION OF VULVAR;  Surgeon: [name], MD;  Location: [facility];  Service: Gynecology;  Laterality: N/A;   Wide Local Excision of labia majora  04-04-2010   Right-sided lesion, CO2 ablation of right labia minora    Allergies  Allergen Reactions   Latex Swelling and Other (See Comments)     fever blisters   Augmentin [Amoxicillin-Pot Clavulanate] Nausea And Vomiting   Doxycycline Other (See Comments)    Blurred vision, nervous, unsteady   Lactose Intolerance (Gi) Other (See Comments)    Unknown, per pt   Lialda [Mesalamine] Diarrhea   Prinivil [Lisinopril] Diarrhea   Triamcinolone Acetonide Other (See Comments)    REDNESS AND PAIN   Amlodipine Rash   Lotensin [Benazepril Hcl] Rash    Outpatient Encounter Medications as of 03/24/2019    Medication Sig   acetaminophen (TYLENOL) 325 MG tablet Take 2 tablets (650 mg total) by mouth every 6 (six) hours.   alendronate (FOSAMAX) 70 MG tablet Take 70 mg by mouth every Monday.    atorvastatin (LIPITOR) 40 MG tablet Take 1 tablet by mouth once daily   bisacodyl (DULCOLAX) 10 MG suppository If not relieved by MOM, give 10 mg Bisacodyl suppositiory rectally X 1 dose in 24 hours as needed (Do not use constipation standing orders for residents with renal failure/CFR less than 30. Contact MD for orders) (Physician Order)   busPIRone (BUSPAR) 30 MG tablet Take 30 mg by mouth 2 (two) times daily.    Calcium Carbonate-Vitamin D (CALTRATE 600+D PO) Take 1 tablet by mouth daily.    carvedilol (COREG) 6.25 MG tablet Take 1 tablet (6.25 mg total) by mouth 2 (two) times daily with  a meal.   Cholecalciferol (VITAMIN D3) 1000 UNITS CAPS Take 1,000 Units by mouth daily.    clonazePAM (KLONOPIN) 0.5 MG tablet Take 1 tablet (0.5 mg total) by mouth 3 (three) times daily as needed for up to 14 days for anxiety.   diltiazem (TIAZAC) 360 MG 24 hr capsule Take 360 mg by mouth daily.    docusate sodium (COLACE) 100 MG capsule Take 1 capsule (100 mg total) by mouth 2 (two) times daily.   enoxaparin (LOVENOX) 40 MG/0.4ML injection Inject 0.4 mLs (40 mg total) into the skin daily for 30 doses. For DVT prophylaxis   hydrALAZINE (APRESOLINE) 25 MG tablet Take 3 tablets (75 mg total) by mouth 3 (three) times daily.   HYDROcodone-acetaminophen (NORCO/VICODIN) 5-325 MG tablet Take 1 tablet by mouth every 6 (six) hours as needed for up to 5 days for moderate pain. for pain x 5 days   hydroxychloroquine (PLAQUENIL) 200 MG tablet Take 200 mg by mouth daily.   magnesium hydroxide (MILK OF MAGNESIA) 400 MG/5ML suspension If no BM in 3 days, give 30 cc Milk of Magnesium p.o. x 1 dose in 24 hours as needed (Do not use standing constipation orders for residents with renal failure CFR less than 30. Contact MD for  orders) (Physician Order)   methocarbamol (ROBAXIN) 500 MG tablet Take 1 tablet (500 mg total) by mouth every 8 (eight) hours as needed for muscle spasms.   Nutritional Supplement LIQD Take 120 mLs by mouth 2 (two) times a day. MedPass   polyethylene glycol (MIRALAX / GLYCOLAX) 17 g packet Take 17 g by mouth daily as needed for mild constipation.   predniSONE (DELTASONE) 5 MG tablet Take 5 mg by mouth daily with breakfast. --  LONG-TERM USE FOR RA   senna-docusate (SENOKOT-S) 8.6-50 MG tablet Take 1 tablet by mouth 2 (two) times daily.   sodium chloride 1 g tablet Take 1 tablet (1 g total) by mouth 2 (two) times daily with a meal.   Sodium Phosphates (RA SALINE ENEMA RE) If not relieved by Biscodyl suppository, give disposable Saline Enema rectally X 1 dose/24 hrs as needed (Do not use constipation standing orders for residents with renal failure/CFR less than 30. Contact MD for orders)(Physician Or   No facility-administered encounter medications on file as of 03/24/2019.     Review of Systems  GENERAL: No change in appetite, no fatigue, no weight changes, no fever, chills or weakness MOUTH and THROAT: Denies oral discomfort, gingival pain or bleeding, pain from teeth or hoarseness   RESPIRATORY: no cough, SOB, DOE, wheezing, hemoptysis CARDIAC: No chest pain, edema or palpitations GI: No abdominal pain, diarrhea, constipation, heart burn, nausea or vomiting GU: frequent urination NEUROLOGICAL: Denies dizziness, syncope, numbness, or headache PSYCHIATRIC: Denies feelings of depression or anxiety. No report of hallucinations, insomnia, paranoia, or agitation    Immunization History  Administered Date(s) Administered   Influenza, High Dose Seasonal PF 07/13/2017   Tdap 09/11/2018   Pertinent  Health Maintenance Due  Topic Date Due   COLONOSCOPY  04/21/2019 (Originally 02/23/1995)   PNA vac Low Risk Adult (1 of 2 - PCV13) 04/21/2019 (Originally 02/22/2010)   INFLUENZA VACCINE   04/02/2019   MAMMOGRAM  08/12/2020   DEXA SCAN  Completed   No flowsheet data found.   Vitals:   03/24/19 1059  BP: (!) 161/70  Pulse: 88  Resp: 16  Temp: 97.9 F (36.6 C)  TempSrc: Oral  SpO2: 96%  Weight: 104 lb 14.4 oz (47.6 kg)  Height: 5\' 4"  (1.626 m)   Body mass index is 18.01 kg/m.  Physical Exam  GENERAL APPEARANCE:  In no acute distress.  SKIN:  Right thigh surgical site with steri strips and dressing, has black dry blood, no redness MOUTH and THROAT: Lips are without lesions. Oral mucosa is moist and without lesions. Tongue is normal in shape, size, and color and without lesions RESPIRATORY: Breathing is even & unlabored, BS CTAB CARDIAC: RRR, no murmur,no extra heart sounds, no edema GI: Abdomen soft, normal BS, no masses, no tenderness EXTREMITIES:  Able to move X 4 extremities NEUROLOGICAL: There is no tremor. Speech is clear. Alert and oriented X 3. PSYCHIATRIC:  Affect and behavior are appropriate  Labs reviewed: Recent Labs    02/21/19 0559 02/22/19 0630  03/13/19 0246  03/17/19 0332 03/18/19 0243 03/19/19 0255  NA 125* 126*   < > 118*   < > 126* 123* 127*  K 4.4 3.8   < > 3.9   < > 4.6 4.0 3.9  CL 99 97*   < > 88*   < > 92* 90* 94*  CO2 21* 21*   < > 20*   < > 21* 24 25  GLUCOSE 111* 86   < > 87   < > 111* 96 108*  BUN 24* 24*   < > 14   < > 25* 20 17  CREATININE 1.15* 1.06*   < > 1.03*   < > 0.99 0.91 0.86  CALCIUM 8.3* 8.5*   < > 8.1*   < > 9.0 8.4* 8.7*  MG 1.8 1.8  --  2.2  --   --   --   --    < > = values in this interval not displayed.   No results for input(s): AST, ALT, ALKPHOS, BILITOT, PROT, ALBUMIN in the last 8760 hours. Recent Labs    01/28/19 1001  02/14/19 2059  03/12/19 0403  03/18/19 0243 03/18/19 1806 03/19/19 0255  WBC 9.6   < > 9.8   < > 11.9*   < > 6.4 7.5 7.7  NEUTROABS 7.5  --  8.5*  --  9.9*  --   --   --   --   HGB 12.3   < > 8.6*   < > 10.8*   < > 7.0* 10.0* 9.7*  HCT 34.6*   < > 24.2*   < > 29.8*   < >  19.3* 28.1* 27.3*  MCV 100.3*   < > 101.3*   < > 100.7*   < > 101.0* 95.3 95.1  PLT 190   < > 221   < > 225   < > 200 209 206   < > = values in this interval not displayed.   Lab Results  Component Value Date   TSH 0.944 02/19/2019   Lab Results  Component Value Date   HGBA1C 5.4 09/24/2017   Lab Results  Component Value Date   CHOL 143 09/24/2017   HDL 96 09/24/2017   LDLCALC 42 09/24/2017   TRIG 24 09/24/2017   CHOLHDL 1.5 09/24/2017    Significant Diagnostic Results in last 30 days:  Dg Pelvis 1-2 Views  Result Date: 03/13/2019 CLINICAL DATA:  Pelvic pain. Status post fall last night. Initial encounter. EXAM: PELVIS - 1-2 VIEW COMPARISON:  Single-view of the pelvis 03/12/2019. CT abdomen and pelvis 02/15/2019. FINDINGS: Possible right parasymphyseal pubic bone fracture seen on yesterday's examination is not visible today. No acute bony or  joint abnormality is identified. The patient is status post L4-5 fusion. IMPRESSION: No acute abnormality. Possible right parasymphyseal pubic bone fracture on yesterday's examination is likely due to overlapping shadows. Electronically Signed   By: Inge Rise M.D.   On: 03/13/2019 14:08   Dg Elbow Complete Left  Result Date: 03/12/2019 CLINICAL DATA:  Initial evaluation for acute trauma, fall. EXAM: LEFT ELBOW - COMPLETE 3+ VIEW COMPARISON:  None. FINDINGS: There is no evidence of fracture, dislocation, or joint effusion. There is no evidence of arthropathy or other focal bone abnormality. Soft tissues are unremarkable. IMPRESSION: No acute osseous abnormality about the left elbow. Electronically Signed   By: Jeannine Boga M.D.   On: 03/12/2019 04:50   Dg Elbow Complete Right  Result Date: 03/12/2019 CLINICAL DATA:  Initial evaluation for acute trauma, fall. EXAM: RIGHT ELBOW - COMPLETE 3+ VIEW COMPARISON:  None. FINDINGS: No acute fracture dislocation. No joint effusion. Radial head intact. Mild degenerative spurring present about  the elbow. No visible soft tissue injury. IMPRESSION: No acute osseous abnormality about the right elbow. Electronically Signed   By: Jeannine Boga M.D.   On: 03/12/2019 04:52   Ct Head Wo Contrast  Result Date: 03/12/2019 CLINICAL DATA:  Pt BIB [facility] from home alone. Pt was changing her thermostat when she lost her balance and fell. Pt has hx of back surgery on May 29 EXAM: CT HEAD WITHOUT CONTRAST CT MAXILLOFACIAL WITHOUT CONTRAST CT CERVICAL SPINE WITHOUT CONTRAST TECHNIQUE: Multidetector CT imaging of the head, cervical spine, and maxillofacial structures were performed using the standard protocol without intravenous contrast. Multiplanar CT image reconstructions of the cervical spine and maxillofacial structures were also generated. COMPARISON:  09/24/2017 FINDINGS: CT HEAD FINDINGS Brain: No evidence of acute infarction, hemorrhage, hydrocephalus, extra-axial collection or mass lesion/mass effect. Vascular: No hyperdense vessel or unexpected calcification. Skull: Normal. Negative for fracture or focal lesion. Other: None. CT MAXILLOFACIAL FINDINGS Osseous: No fracture or mandibular dislocation. No destructive process. Orbits: Negative. No traumatic or inflammatory finding. Sinuses: Clear. Soft tissues: Negative. CT CERVICAL SPINE FINDINGS Alignment: Normal. Skull base and vertebrae: No acute fracture. No primary bone lesion or focal pathologic process. Soft tissues and spinal canal: No prevertebral fluid or swelling. No visible canal hematoma. Disc levels: Mild loss of disc height at C4-C5 and C5-C6. Small endplate osteophytes at these levels. Minor disc bulging. No convincing disc herniation. There are facet degenerative changes most evident bilaterally at C5-C6. Upper chest: No acute findings. No masses or adenopathy. Mild centrilobular emphysema and scarring at the apices of the lungs. Other: None. IMPRESSION: HEAD CT 1. No intracranial abnormality. 2. No skull fracture. MAXILLOFACIAL CT 1. No  fracture or acute finding. CERVICAL CT 1. No fracture or acute finding. Electronically Signed   By: Lajean Manes M.D.   On: 03/12/2019 04:39   Ct Cervical Spine Wo Contrast  Result Date: 03/12/2019 CLINICAL DATA:  Pt BIB [facility] from home alone. Pt was changing her thermostat when she lost her balance and fell. Pt has hx of back surgery on May 29 EXAM: CT HEAD WITHOUT CONTRAST CT MAXILLOFACIAL WITHOUT CONTRAST CT CERVICAL SPINE WITHOUT CONTRAST TECHNIQUE: Multidetector CT imaging of the head, cervical spine, and maxillofacial structures were performed using the standard protocol without intravenous contrast. Multiplanar CT image reconstructions of the cervical spine and maxillofacial structures were also generated. COMPARISON:  09/24/2017 FINDINGS: CT HEAD FINDINGS Brain: No evidence of acute infarction, hemorrhage, hydrocephalus, extra-axial collection or mass lesion/mass effect. Vascular: No hyperdense vessel or unexpected calcification.  Skull: Normal. Negative for fracture or focal lesion. Other: None. CT MAXILLOFACIAL FINDINGS Osseous: No fracture or mandibular dislocation. No destructive process. Orbits: Negative. No traumatic or inflammatory finding. Sinuses: Clear. Soft tissues: Negative. CT CERVICAL SPINE FINDINGS Alignment: Normal. Skull base and vertebrae: No acute fracture. No primary bone lesion or focal pathologic process. Soft tissues and spinal canal: No prevertebral fluid or swelling. No visible canal hematoma. Disc levels: Mild loss of disc height at C4-C5 and C5-C6. Small endplate osteophytes at these levels. Minor disc bulging. No convincing disc herniation. There are facet degenerative changes most evident bilaterally at C5-C6. Upper chest: No acute findings. No masses or adenopathy. Mild centrilobular emphysema and scarring at the apices of the lungs. Other: None. IMPRESSION: HEAD CT 1. No intracranial abnormality. 2. No skull fracture. MAXILLOFACIAL CT 1. No fracture or acute finding.  CERVICAL CT 1. No fracture or acute finding. Electronically Signed   By: Lajean Manes M.D.   On: 03/12/2019 04:39   Ct Lumbar Spine Wo Contrast  Result Date: 03/12/2019 CLINICAL DATA:  Per ed notes: Pt BIB [facility] from home alone. Pt was changing her thermostat when she lost her balance and fell. Pt has hx of back surgery on May 29 EXAM: CT LUMBAR SPINE WITHOUT CONTRAST TECHNIQUE: Multidetector CT imaging of the lumbar spine was performed without intravenous contrast administration. Multiplanar CT image reconstructions were also generated. COMPARISON:  CT, 02/15/2019 FINDINGS: Segmentation: 5 lumbar type vertebrae. Alignment: Normal. Vertebrae: Choose 1 Paraspinal and other soft tissues: No masses or adenopathy. Chronic bilateral perinephric stranding similar to the prior CT. Dense aortic atherosclerotic calcifications. Disc levels: T12-L1 through L2-L3: No disc bulging or herniation. No stenosis. L3-L4: Mild spondylotic disc bulging. No disc herniation. No significant stenosis. L4-L5: Bilateral pedicle screws and interconnecting rods fuse L4-L5. The orthopedic hardware is well-seated and unchanged from the prior abdomen and pelvis CT. There is a disc spacer and bone graft material within the L4-L5 disc interspace and along the posterior elements. This all appears stable from the prior study. L5-S1: Moderate loss of disc height. Mild facet degenerative change. Spondylotic disc bulging, but no disc herniation. Mild right neural foraminal narrowing. IMPRESSION: 1. No fracture or acute finding. 2. Status post posterior lumbar spine fusion at L4-L5. No evidence of disruption of the orthopedic hardware. No change from the study dated 02/15/2019. Electronically Signed   By: Lajean Manes M.D.   On: 03/12/2019 04:44   Mr Lumbar Spine W Wo Contrast  Result Date: 03/12/2019 CLINICAL DATA:  Patient status post L4-5 fusion 01/28/2019. Low back and left leg pain since the procedure. EXAM: MRI LUMBAR SPINE WITHOUT AND  WITH CONTRAST TECHNIQUE: Multiplanar and multiecho pulse sequences of the lumbar spine were obtained without and with intravenous contrast. CONTRAST:  5 cc Gadavist IV. COMPARISON:  MRI lumbar spine 02/15/2019. CT lumbar spine this same day. FINDINGS: Segmentation:  Standard. Alignment:  Trace anterolisthesis L4 on L5.  Otherwise maintained. Vertebrae: No fracture, evidence of discitis, or bone lesion. Status post L4-5 fusion as described below. Conus medullaris and cauda equina: Conus extends to the L1 level. Conus and cauda equina appear normal. Paraspinal and other soft tissues: Left renal cyst noted. Disc levels: T10-11 and T11-12 are imaged in the sagittal plane only and negative. T12-L1: Negative. L1-2: Mild facet degenerative change.  Otherwise negative. L2-3: Minimal disc bulge and mild-to-moderate facet arthropathy. No stenosis. L3-4: There is a disc bulge eccentric to the left and mild to moderate facet arthropathy. Dorsal epidural fat is prominent.  Moderate compression of the thecal sac and left worse than right subarticular recess narrowing appear unchanged. Neural foramina are open. L4-5: Status post laminectomy and discectomy and fusion. Based on comparison with prior CT scan, acute or protruding into the disc interspace compatible with interbody graft and likely disc. Compression of the thecal sac seen on the prior MRI has markedly improved likely due to resolving postoperative edema. The central canal is now open. Mild to moderate bilateral foraminal narrowing appears greater on the left and unchanged. L5-S1: Bilateral facet degenerative change and a shallow disc bulge. The central canal is open. Marked right foraminal narrowing is unchanged. Left foramen is minimally narrowed. IMPRESSION: Status post L4-5 fusion. Compression of the thecal sac seen on the prior MRI has markedly improved since the prior examination likely due to resolving postoperative edema. Mild to moderate foraminal narrowing at  this level appears greater on the left and not notably changed. No change in marked right foraminal narrowing at L5-S1. No change in moderate compression of the thecal sac at L3-4 due to dorsal epidural fat and disc bulge to the left. Left worse than right subarticular recess narrowing at this level is also unchanged. Electronically Signed   By: Inge Rise M.D.   On: 03/12/2019 14:32   Dg Pelvis Portable  Result Date: 03/12/2019 CLINICAL DATA:  Initial evaluation for acute trauma, fall. EXAM: PORTABLE PELVIS 1-2 VIEWS COMPARISON:  None. FINDINGS: Curvilinear lucency extends through the right pubis symphysis, favored to reflect summation of overlying soft tissue shadow, although an acute nondisplaced fracture not entirely excluded. No pubic diastasis. Remainder the bony pelvis intact. SI joints approximated. No abnormality about the hips. Prior fusion noted at L4-5. Degenerative changes noted within the lower lumbar spine. Mild osteoarthritic changes about the hips. No visible soft tissue injury. Scattered vascular calcifications noted. IMPRESSION: 1. Curvilinear lucency extending through the right pubis symphysis, favored to reflect summation of overlying soft tissue shadow, although an acute nondisplaced fracture not entirely excluded. Correlation with physical exam recommended. 2. No other acute osseous abnormality about the pelvis. Electronically Signed   By: Jeannine Boga M.D.   On: 03/12/2019 04:54   Mr Sacrum Si Joints Wo Contrast  Result Date: 03/14/2019 CLINICAL DATA:  Recent fall. Tailbone pain. Evaluate for sacral insufficiency fracture. EXAM: MRI SACRUM WITHOUT CONTRAST TECHNIQUE: Multiplanar, multisequence MR imaging of the sacrum was performed. No intravenous contrast was administered. COMPARISON:  Pelvic x-rays from yesterday. CT abdomen pelvis dated February 15, 2019. FINDINGS: Bones/Joint/Cartilage Acute minimally impacted subcapital right femoral neck fracture. No sacral ala  fracture. No dislocation. No joint effusion. Prior L4-L5 fusion. Muscles and Tendons Grossly intact.  No muscle edema or atrophy. Soft tissue No fluid collection or hematoma. No soft tissue mass. Foley catheter in the bladder. IMPRESSION: 1. Acute minimally impacted subcapital right femoral neck fracture. Consider dedicated right hip x-rays for further evaluation. 2. No additional acute osseous abnormality.  No sacral ala fracture. These results will be called to the ordering clinician or representative by the Radiologist Assistant, and communication documented in the PACS or zVision Dashboard. Electronically Signed   By: Titus Dubin M.D.   On: 03/14/2019 15:40   Dg Chest Portable 1 View  Result Date: 03/12/2019 CLINICAL DATA:  Initial evaluation for acute trauma, fall. EXAM: PORTABLE CHEST 1 VIEW COMPARISON:  Prior radiograph from 09/12/2018 FINDINGS: Transverse heart size at the upper limits of normal. Mediastinal silhouette within normal limits. Aortic atherosclerosis. Lungs mildly hypoinflated. No focal infiltrates. No edema or effusion.  No pneumothorax. No acute osseous abnormality. Diffuse osteopenia. IMPRESSION: 1. No active cardiopulmonary disease. 2. Aortic atherosclerosis. Electronically Signed   By: Jeannine Boga M.D.   On: 03/12/2019 04:48   Dg C-arm 1-60 Min  Result Date: 03/15/2019 CLINICAL DATA:  RIGHT hip pinning. EXAM: DG C-ARM 61-120 MIN COMPARISON:  None. FINDINGS: Intraoperative fluoroscopic images demonstrate placement of 3 fixation screws traversing the RIGHT femoral neck. Fluoroscopy provided for 41 seconds. IMPRESSION: Fluoroscopy provided for 41 seconds. Electronically Signed   By: Franki Cabot M.D.   On: 03/15/2019 14:00   Dg Hip Operative Unilat W Or W/o Pelvis Right  Result Date: 03/15/2019 CLINICAL DATA:  Right hip pinning EXAM: OPERATIVE right HIP (WITH PELVIS IF PERFORMED) for VIEWS TECHNIQUE: Fluoroscopic spot image(s) were submitted for interpretation  post-operatively. COMPARISON:  03/14/2019 FINDINGS: Images show placement of 3 Knowles type pins for fixation of a femoral neck fracture. No radiographically detectable complication. IMPRESSION: Knowles pin placement for fixation of a right femoral neck fracture. Electronically Signed   By: Nelson Chimes M.D.   On: 03/15/2019 15:34   Dg Hip Unilat With Pelvis 2-3 Views Right  Result Date: 03/14/2019 CLINICAL DATA:  Fall several days ago with known right hip fracture EXAM: DG HIP (WITH OR WITHOUT PELVIS) 3V RIGHT COMPARISON:  03/12/2019 FINDINGS: Impacted right femoral neck fracture is seen similar to that noted on prior MRI. Postsurgical changes in the lumbar spine are seen. No other fracture is noted. IMPRESSION: Mildly impacted right femoral neck fracture. No other focal abnormality is seen. Electronically Signed   By: Inez Catalina M.D.   On: 03/14/2019 18:13   Ct Maxillofacial Wo Contrast  Result Date: 03/12/2019 CLINICAL DATA:  Pt BIB [facility] from home alone. Pt was changing her thermostat when she lost her balance and fell. Pt has hx of back surgery on May 29 EXAM: CT HEAD WITHOUT CONTRAST CT MAXILLOFACIAL WITHOUT CONTRAST CT CERVICAL SPINE WITHOUT CONTRAST TECHNIQUE: Multidetector CT imaging of the head, cervical spine, and maxillofacial structures were performed using the standard protocol without intravenous contrast. Multiplanar CT image reconstructions of the cervical spine and maxillofacial structures were also generated. COMPARISON:  09/24/2017 FINDINGS: CT HEAD FINDINGS Brain: No evidence of acute infarction, hemorrhage, hydrocephalus, extra-axial collection or mass lesion/mass effect. Vascular: No hyperdense vessel or unexpected calcification. Skull: Normal. Negative for fracture or focal lesion. Other: None. CT MAXILLOFACIAL FINDINGS Osseous: No fracture or mandibular dislocation. No destructive process. Orbits: Negative. No traumatic or inflammatory finding. Sinuses: Clear. Soft tissues:  Negative. CT CERVICAL SPINE FINDINGS Alignment: Normal. Skull base and vertebrae: No acute fracture. No primary bone lesion or focal pathologic process. Soft tissues and spinal canal: No prevertebral fluid or swelling. No visible canal hematoma. Disc levels: Mild loss of disc height at C4-C5 and C5-C6. Small endplate osteophytes at these levels. Minor disc bulging. No convincing disc herniation. There are facet degenerative changes most evident bilaterally at C5-C6. Upper chest: No acute findings. No masses or adenopathy. Mild centrilobular emphysema and scarring at the apices of the lungs. Other: None. IMPRESSION: HEAD CT 1. No intracranial abnormality. 2. No skull fracture. MAXILLOFACIAL CT 1. No fracture or acute finding. CERVICAL CT 1. No fracture or acute finding. Electronically Signed   By: Lajean Manes M.D.   On: 03/12/2019 04:39    Assessment/Plan  1. Anxiety - will schedule schedule Clonazepam 0.5 mg BID and discontinue PRN, monitor behavior, psych consult  2. Tobacco abuse - will start Nicotine patch 7 mg/day daily, this might also help  with her anxiety  3. Urinary retention - has pulled out her foley catheter, reports having frequent urination and denies urinary retention, will follow-up with urology, will get urinalysis with culture and sensitivity   Family/ staff Communication:  Discussed plan of care with resident.  Labs/tests ordered:  Urinalysis with culture and sensitivity  Goals of care:   Short-term care   Durenda Age, DNP, FNP-BC [facility] [phone] (Monday-Friday 8:00 a.m. - 5:00 p.m.) [phone] (after hours)

## 2019-03-29 ENCOUNTER — Encounter: Payer: Self-pay | Admitting: Adult Health

## 2019-03-29 ENCOUNTER — Non-Acute Institutional Stay (SKILLED_NURSING_FACILITY): Payer: PPO | Admitting: Adult Health

## 2019-03-29 DIAGNOSIS — F419 Anxiety disorder, unspecified: Secondary | ICD-10-CM

## 2019-03-29 DIAGNOSIS — R52 Pain, unspecified: Secondary | ICD-10-CM | POA: Diagnosis not present

## 2019-03-29 DIAGNOSIS — D62 Acute posthemorrhagic anemia: Secondary | ICD-10-CM | POA: Diagnosis not present

## 2019-03-29 LAB — CBC AND DIFFERENTIAL
HCT: 28 — AB (ref 36–46)
Hemoglobin: 9.9 — AB (ref 12.0–16.0)
Neutrophils Absolute: 3
Platelets: 251 (ref 150–399)
WBC: 5.8

## 2019-03-29 LAB — BASIC METABOLIC PANEL
BUN: 15 (ref 4–21)
Creatinine: 0.9 (ref 0.5–1.1)
Glucose: 108
Potassium: 3.7 (ref 3.4–5.3)
Sodium: 128 — AB (ref 137–147)

## 2019-03-29 NOTE — Progress Notes (Signed)
Location:  [facility] Room Number: 306/B Place of Service:  SNF (31) Provider:  Durenda Age, DNP, FNP-BC  Patient Care Team: Maurice Small, MD as PCP - General (Family Medicine) Jerline Pain, MD as PCP - Cardiology (Cardiology)  Extended Emergency Contact Information Primary Emergency Contact: Arlee Muslim Address: [address], [place] [address] [place] Phone: [phone] Mobile Phone: [phone] Relation: Son Secondary Emergency Contact: Delmar Landau, [place] [address] [place] Phone: [phone] Relation: Son  Code Status:  Full Code  Goals of care: Advanced Directive information Advanced Directives 03/29/2019  Does Patient Have a Medical Advance Directive? Yes  Type of Advance Directive (No Data)  Does patient want to make changes to medical advance directive? No - Patient declined  Copy of [place] in Chart? -  Would patient like information on creating a medical advance directive? -  Pre-existing out of facility DNR order (yellow form or pink MOST form) -     Chief Complaint  Patient presents with   Acute Visit    Anxiety and Pain Management    HPI:  Pt is a 73 y.o. female seen today for an acute visit. She was seen in her room today. She was seen getting on her bedside commode. Noted that she is less anxious. She was given Clonazepam 0.5 mg BID recently due to anxiety - has pulled out her foley catheter, screamed at staff and got out of the quarantine area. Today, she verbalized feeling better and has agreed to get the Clonazepam PRN instead of routinely. She claims to have 2/10 pain on her right thigh. Her surgical incision has staples, dry and no erythema.  Latest hgb 9.2 (03/19/19 hgb 9.7), sodium 129 (previous Na 127), and K 3.7.  She has been admitted to [facility] on 03/19/2019 S/P fall sustaining right  subcapital femur neck fracture for which she had a surgical repair on 03/15/2019.  She has PMH of chronic hyponatremia, COPD, depression, GERD and hypertension.   Past Medical History:  Diagnosis Date   AIN grade I    Anxiety    Bruises easily    Chronic diarrhea    Chronic hyponatremia    Colitis    COPD (chronic obstructive pulmonary disease) (HCC)    Depression    GERD (gastroesophageal reflux disease)    History of chronic bronchitis    History of vulvar dysplasia    serveral recurrency's    Hypertension    LBBB (left bundle branch block) W/ PROLONGED PR   CARDIOLOGSIT-  DR Johnsie Cancel-   LOV IN EPIC   Macrocytosis    Nocturia    Osteoporosis    Seronegative rheumatoid arthritis (HCC)    Short of breath on exertion    Smokers' cough (HCC)    Venous insufficiency of leg    , Edema   VIN III (vulvar intraepithelial neoplasia III)    RECURRENT -   Past Surgical History:  Procedure Laterality Date   APPENDECTOMY  1992   CARDIOVASCULAR STRESS TEST  06-23-2012  DR Jenkins [place]   NORMAL LEXISCAN MYOVIEW/ EF 83%/  NO ISCHEMIA   CO2 LASER APPLICATION  67/20/9470   Procedure: CO2 LASER APPLICATION;  Surgeon: Janie Morning, MD PHD;  Location: [facility];  Service: Gynecology;  Laterality: N/A;  Laser Vaporization  CO2 LASER APPLICATION N/A 09/06/1094   Procedure: CO2 LASER APPLICATION;  Surgeon: [name], MD;  Location: [facility];  Service: Gynecology;  Laterality: N/A;   CO2 LASER APPLICATION N/A 0/45/4098   Procedure: CO2 LASER APPLICATION of the vulva;  Surgeon: Janie Morning, MD;  Location: [facility];  Service: Gynecology;  Laterality: N/A;   EXCISION VULVAR LESIONS  07/2012   HEMORRHOID SURGERY N/A 04/15/2013   Procedure: Excision of external and internal  hemorrhoid with AIN, Exam under anesthesia;  Surgeon: Odis Hollingshead, MD;  Location: [facility];  Service: General;  Laterality: N/A;   HIP  PINNING,CANNULATED Right 03/15/2019   Procedure: Maebelle Munroe OF FEMORAL NECK FRACTURE;  Surgeon: Renette Butters, MD;  Location: [facility];  Service: Orthopedics;  Laterality: Right;   REPAIR FISTULA IN ANO/  I & D PERIRECTAL ABSCESS  10-26-2002  DR Johnathan Hausen   TRANSTHORACIC ECHOCARDIOGRAM  06-23-2012   NORMAL LVSF/ EF 55-60%   VAGINAL HYSTERECTOMY  1997   partial   VULVAR LESION REMOVAL N/A 11/03/2014   Procedure: CO2 LASER OF VULVAR ;  Surgeon: [name], MD;  Location: [facility];  Service: Gynecology;  Laterality: N/A;   VULVECTOMY N/A 03/26/2015   Procedure: WIDE LOCA  EXCISION OF VULVAR;  Surgeon: [name], MD;  Location: [facility];  Service: Gynecology;  Laterality: N/A;   Wide Local Excision of labia majora  04-04-2010   Right-sided lesion, CO2 ablation of right labia minora    Allergies  Allergen Reactions   Latex Swelling and Other (See Comments)     fever blisters   Augmentin [Amoxicillin-Pot Clavulanate] Nausea And Vomiting   Doxycycline Other (See Comments)    Blurred vision, nervous, unsteady   Lactose Intolerance (Gi) Other (See Comments)    Unknown, per pt   Lialda [Mesalamine] Diarrhea   Prinivil [Lisinopril] Diarrhea   Triamcinolone Acetonide Other (See Comments)    REDNESS AND PAIN   Amlodipine Rash   Lotensin [Benazepril Hcl] Rash    Outpatient Encounter Medications as of 03/29/2019  Medication Sig   acetaminophen (TYLENOL) 325 MG tablet Take 2 tablets (650 mg total) by mouth every 6 (six) hours.   alendronate (FOSAMAX) 70 MG tablet Take 70 mg by mouth every Monday.    atorvastatin (LIPITOR) 40 MG tablet Take 1 tablet by mouth once daily   bisacodyl (DULCOLAX) 10 MG suppository If not relieved by MOM, give 10 mg Bisacodyl suppositiory rectally X 1 dose in 24 hours as needed (Do not use constipation standing orders for residents with renal failure/CFR less than 30. Contact MD for orders) (Physician Order)    busPIRone (BUSPAR) 30 MG tablet Take 30 mg by mouth 2 (two) times daily.    Calcium Carbonate-Vitamin D (CALTRATE 600+D PO) Take 1 tablet by mouth daily.    carvedilol (COREG) 6.25 MG tablet Take 1 tablet (6.25 mg total) by mouth 2 (two) times daily with a meal.   Cholecalciferol (VITAMIN D3) 1000 UNITS CAPS Take 1,000 Units by mouth daily.    clonazePAM (KLONOPIN) 0.5 MG tablet Take 0.5 mg by mouth 2 (two) times daily.   diltiazem (TIAZAC) 360 MG 24 hr capsule Take 360 mg by mouth daily.    docusate sodium (COLACE) 100 MG capsule Take 1 capsule (100 mg total) by mouth 2 (two) times daily.   enoxaparin (LOVENOX) 40 MG/0.4ML injection Inject 0.4 mLs (40 mg total) into the skin daily for 30 doses. For DVT prophylaxis  hydrALAZINE (APRESOLINE) 25 MG tablet Take 3 tablets (75 mg total) by mouth 3 (three) times daily.   HYDROcodone-acetaminophen (NORCO/VICODIN) 5-325 MG tablet Take 1 tablet by mouth every 8 (eight) hours as needed for moderate pain. For pain for 14 days   hydroxychloroquine (PLAQUENIL) 200 MG tablet Take 200 mg by mouth daily.   magnesium hydroxide (MILK OF MAGNESIA) 400 MG/5ML suspension If no BM in 3 days, give 30 cc Milk of Magnesium p.o. x 1 dose in 24 hours as needed (Do not use standing constipation orders for residents with renal failure CFR less than 30. Contact MD for orders) (Physician Order)   methocarbamol (ROBAXIN) 500 MG tablet Take 1 tablet (500 mg total) by mouth every 8 (eight) hours as needed for muscle spasms.   nicotine (NICODERM CQ - DOSED IN MG/24 HR) 7 mg/24hr patch APPLY 1 PATCH TOPICALLY ONCE DAILY FOR SMOKING CESSATION   NON FORMULARY Apply foam dressing to sacrum 2 x week for protection   Nutritional Supplement LIQD Take 120 mLs by mouth 2 (two) times a day. Med[employer]   polyethylene glycol (MIRALAX / GLYCOLAX) 17 g packet Take 17 g by mouth daily as needed. For constipation   predniSONE (DELTASONE) 5 MG tablet Take 5 mg by mouth daily with  breakfast. --  LONG-TERM USE FOR RA   senna-docusate (SENOKOT-S) 8.6-50 MG tablet Take 1 tablet by mouth 2 (two) times daily.   sodium chloride 1 g tablet Take 1 tablet (1 g total) by mouth 2 (two) times daily with a meal.   Sodium Phosphates (RA SALINE ENEMA RE) If not relieved by Biscodyl suppository, give disposable Saline Enema rectally X 1 dose/24 hrs as needed (Do not use constipation standing orders for residents with renal failure/CFR less than 30. Contact MD for orders)(Physician Or   [DISCONTINUED] clonazePAM (KLONOPIN) 0.5 MG tablet Take 1 tablet (0.5 mg total) by mouth 3 (three) times daily as needed for up to 14 days for anxiety. (Patient taking differently: Take 0.5 mg by mouth 2 (two) times daily. )   [DISCONTINUED] polyethylene glycol (MIRALAX / GLYCOLAX) 17 g packet Take 17 g by mouth daily as needed for mild constipation.   No facility-administered encounter medications on file as of 03/29/2019.     Review of Systems  GENERAL: No change in appetite, no fatigue, no weight changes, no fever, chills or weakness MOUTH and THROAT: Denies oral discomfort, gingival pain or bleeding, pain from teeth or hoarseness   RESPIRATORY: no cough, SOB, DOE, wheezing, hemoptysis CARDIAC: No chest pain, edema or palpitations GI: No abdominal pain, diarrhea, constipation, heart burn, nausea or vomiting GU: Denies dysuria, frequency, hematuria, incontinence, or discharge NEUROLOGICAL: Denies dizziness, syncope, numbness, or headache PSYCHIATRIC: Denies feelings of depression or anxiety. No report of hallucinations, insomnia, paranoia, or agitation   Immunization History  Administered Date(s) Administered   Influenza, High Dose Seasonal PF 07/13/2017   Tdap 09/11/2018   Pertinent  Health Maintenance Due  Topic Date Due   COLONOSCOPY  04/21/2019 (Originally 02/23/1995)   PNA vac Low Risk Adult (1 of 2 - PCV13) 04/21/2019 (Originally 02/22/2010)   INFLUENZA VACCINE  04/02/2019    MAMMOGRAM  08/12/2020   DEXA SCAN  Completed   No flowsheet data found.   Vitals:   03/29/19 1539  BP: (!) 145/68  Pulse: 80  Resp: 16  Temp: (!) 97.5 F (36.4 C)  TempSrc: Oral  SpO2: 96%  Weight: 104 lb 14.4 oz (47.6 kg)  Height: 5\' 4"  (1.626 m)  Body mass index is 18.01 kg/m.  Physical Exam  GENERAL APPEARANCE:  In no acute distress.  SKIN:  Right thigh surgical site is dry and no erythema MOUTH and THROAT: Lips are without lesions. Oral mucosa is moist and without lesions. Tongue is normal in shape, size, and color and without lesions RESPIRATORY: Breathing is even & unlabored, BS CTAB CARDIAC: RRR, no murmur,no extra heart sounds, no edema GI: Abdomen soft, normal BS, no masses, no tenderness EXTREMITIES:  Able to move X 4 extremities NEUROLOGICAL: There is no tremor. Speech is clear.  Alert and oriented X 3. PSYCHIATRIC:  Affect and behavior are appropriate   Labs reviewed: Recent Labs    02/21/19 0559 02/22/19 0630  03/13/19 0246  03/17/19 0332 03/18/19 0243 03/19/19 0255  NA 125* 126*   < > 118*   < > 126* 123* 127*  K 4.4 3.8   < > 3.9   < > 4.6 4.0 3.9  CL 99 97*   < > 88*   < > 92* 90* 94*  CO2 21* 21*   < > 20*   < > 21* 24 25  GLUCOSE 111* 86   < > 87   < > 111* 96 108*  BUN 24* 24*   < > 14   < > 25* 20 17  CREATININE 1.15* 1.06*   < > 1.03*   < > 0.99 0.91 0.86  CALCIUM 8.3* 8.5*   < > 8.1*   < > 9.0 8.4* 8.7*  MG 1.8 1.8  --  2.2  --   --   --   --    < > = values in this interval not displayed.   No results for input(s): AST, ALT, ALKPHOS, BILITOT, PROT, ALBUMIN in the last 8760 hours. Recent Labs    01/28/19 1001  02/14/19 2059  03/12/19 0403  03/18/19 0243 03/18/19 1806 03/19/19 0255  WBC 9.6   < > 9.8   < > 11.9*   < > 6.4 7.5 7.7  NEUTROABS 7.5  --  8.5*  --  9.9*  --   --   --   --   HGB 12.3   < > 8.6*   < > 10.8*   < > 7.0* 10.0* 9.7*  HCT 34.6*   < > 24.2*   < > 29.8*   < > 19.3* 28.1* 27.3*  MCV 100.3*   < > 101.3*   < >  100.7*   < > 101.0* 95.3 95.1  PLT 190   < > 221   < > 225   < > 200 209 206   < > = values in this interval not displayed.   Lab Results  Component Value Date   TSH 0.944 02/19/2019   Lab Results  Component Value Date   HGBA1C 5.4 09/24/2017   Lab Results  Component Value Date   CHOL 143 09/24/2017   HDL 96 09/24/2017   LDLCALC 42 09/24/2017   TRIG 24 09/24/2017   CHOLHDL 1.5 09/24/2017    Significant Diagnostic Results in last 30 days:  Dg Pelvis 1-2 Views  Result Date: 03/13/2019 CLINICAL DATA:  Pelvic pain. Status post fall last night. Initial encounter. EXAM: PELVIS - 1-2 VIEW COMPARISON:  Single-view of the pelvis 03/12/2019. CT abdomen and pelvis 02/15/2019. FINDINGS: Possible right parasymphyseal pubic bone fracture seen on yesterday's examination is not visible today. No acute bony or joint abnormality is identified. The patient is status post L4-5 fusion.  IMPRESSION: No acute abnormality. Possible right parasymphyseal pubic bone fracture on yesterday's examination is likely due to overlapping shadows. Electronically Signed   By: Inge Rise M.D.   On: 03/13/2019 14:08   Dg Elbow Complete Left  Result Date: 03/12/2019 CLINICAL DATA:  Initial evaluation for acute trauma, fall. EXAM: LEFT ELBOW - COMPLETE 3+ VIEW COMPARISON:  None. FINDINGS: There is no evidence of fracture, dislocation, or joint effusion. There is no evidence of arthropathy or other focal bone abnormality. Soft tissues are unremarkable. IMPRESSION: No acute osseous abnormality about the left elbow. Electronically Signed   By: Jeannine Boga M.D.   On: 03/12/2019 04:50   Dg Elbow Complete Right  Result Date: 03/12/2019 CLINICAL DATA:  Initial evaluation for acute trauma, fall. EXAM: RIGHT ELBOW - COMPLETE 3+ VIEW COMPARISON:  None. FINDINGS: No acute fracture dislocation. No joint effusion. Radial head intact. Mild degenerative spurring present about the elbow. No visible soft tissue injury.  IMPRESSION: No acute osseous abnormality about the right elbow. Electronically Signed   By: Jeannine Boga M.D.   On: 03/12/2019 04:52   Ct Head Wo Contrast  Result Date: 03/12/2019 CLINICAL DATA:  Pt BIB [facility] from home alone. Pt was changing her thermostat when she lost her balance and fell. Pt has hx of back surgery on May 29 EXAM: CT HEAD WITHOUT CONTRAST CT MAXILLOFACIAL WITHOUT CONTRAST CT CERVICAL SPINE WITHOUT CONTRAST TECHNIQUE: Multidetector CT imaging of the head, cervical spine, and maxillofacial structures were performed using the standard protocol without intravenous contrast. Multiplanar CT image reconstructions of the cervical spine and maxillofacial structures were also generated. COMPARISON:  09/24/2017 FINDINGS: CT HEAD FINDINGS Brain: No evidence of acute infarction, hemorrhage, hydrocephalus, extra-axial collection or mass lesion/mass effect. Vascular: No hyperdense vessel or unexpected calcification. Skull: Normal. Negative for fracture or focal lesion. Other: None. CT MAXILLOFACIAL FINDINGS Osseous: No fracture or mandibular dislocation. No destructive process. Orbits: Negative. No traumatic or inflammatory finding. Sinuses: Clear. Soft tissues: Negative. CT CERVICAL SPINE FINDINGS Alignment: Normal. Skull base and vertebrae: No acute fracture. No primary bone lesion or focal pathologic process. Soft tissues and spinal canal: No prevertebral fluid or swelling. No visible canal hematoma. Disc levels: Mild loss of disc height at C4-C5 and C5-C6. Small endplate osteophytes at these levels. Minor disc bulging. No convincing disc herniation. There are facet degenerative changes most evident bilaterally at C5-C6. Upper chest: No acute findings. No masses or adenopathy. Mild centrilobular emphysema and scarring at the apices of the lungs. Other: None. IMPRESSION: HEAD CT 1. No intracranial abnormality. 2. No skull fracture. MAXILLOFACIAL CT 1. No fracture or acute finding. CERVICAL CT 1. No  fracture or acute finding. Electronically Signed   By: Lajean Manes M.D.   On: 03/12/2019 04:39   Ct Cervical Spine Wo Contrast  Result Date: 03/12/2019 CLINICAL DATA:  Pt BIB [facility] from home alone. Pt was changing her thermostat when she lost her balance and fell. Pt has hx of back surgery on May 29 EXAM: CT HEAD WITHOUT CONTRAST CT MAXILLOFACIAL WITHOUT CONTRAST CT CERVICAL SPINE WITHOUT CONTRAST TECHNIQUE: Multidetector CT imaging of the head, cervical spine, and maxillofacial structures were performed using the standard protocol without intravenous contrast. Multiplanar CT image reconstructions of the cervical spine and maxillofacial structures were also generated. COMPARISON:  09/24/2017 FINDINGS: CT HEAD FINDINGS Brain: No evidence of acute infarction, hemorrhage, hydrocephalus, extra-axial collection or mass lesion/mass effect. Vascular: No hyperdense vessel or unexpected calcification. Skull: Normal. Negative for fracture or focal lesion. Other: None. CT  MAXILLOFACIAL FINDINGS Osseous: No fracture or mandibular dislocation. No destructive process. Orbits: Negative. No traumatic or inflammatory finding. Sinuses: Clear. Soft tissues: Negative. CT CERVICAL SPINE FINDINGS Alignment: Normal. Skull base and vertebrae: No acute fracture. No primary bone lesion or focal pathologic process. Soft tissues and spinal canal: No prevertebral fluid or swelling. No visible canal hematoma. Disc levels: Mild loss of disc height at C4-C5 and C5-C6. Small endplate osteophytes at these levels. Minor disc bulging. No convincing disc herniation. There are facet degenerative changes most evident bilaterally at C5-C6. Upper chest: No acute findings. No masses or adenopathy. Mild centrilobular emphysema and scarring at the apices of the lungs. Other: None. IMPRESSION: HEAD CT 1. No intracranial abnormality. 2. No skull fracture. MAXILLOFACIAL CT 1. No fracture or acute finding. CERVICAL CT 1. No fracture or acute finding.  Electronically Signed   By: Lajean Manes M.D.   On: 03/12/2019 04:39   Ct Lumbar Spine Wo Contrast  Result Date: 03/12/2019 CLINICAL DATA:  Per ed notes: Pt BIB [facility] from home alone. Pt was changing her thermostat when she lost her balance and fell. Pt has hx of back surgery on May 29 EXAM: CT LUMBAR SPINE WITHOUT CONTRAST TECHNIQUE: Multidetector CT imaging of the lumbar spine was performed without intravenous contrast administration. Multiplanar CT image reconstructions were also generated. COMPARISON:  CT, 02/15/2019 FINDINGS: Segmentation: 5 lumbar type vertebrae. Alignment: Normal. Vertebrae: Choose 1 Paraspinal and other soft tissues: No masses or adenopathy. Chronic bilateral perinephric stranding similar to the prior CT. Dense aortic atherosclerotic calcifications. Disc levels: T12-L1 through L2-L3: No disc bulging or herniation. No stenosis. L3-L4: Mild spondylotic disc bulging. No disc herniation. No significant stenosis. L4-L5: Bilateral pedicle screws and interconnecting rods fuse L4-L5. The orthopedic hardware is well-seated and unchanged from the prior abdomen and pelvis CT. There is a disc spacer and bone graft material within the L4-L5 disc interspace and along the posterior elements. This all appears stable from the prior study. L5-S1: Moderate loss of disc height. Mild facet degenerative change. Spondylotic disc bulging, but no disc herniation. Mild right neural foraminal narrowing. IMPRESSION: 1. No fracture or acute finding. 2. Status post posterior lumbar spine fusion at L4-L5. No evidence of disruption of the orthopedic hardware. No change from the study dated 02/15/2019. Electronically Signed   By: Lajean Manes M.D.   On: 03/12/2019 04:44   Mr Lumbar Spine W Wo Contrast  Result Date: 03/12/2019 CLINICAL DATA:  Patient status post L4-5 fusion 01/28/2019. Low back and left leg pain since the procedure. EXAM: MRI LUMBAR SPINE WITHOUT AND WITH CONTRAST TECHNIQUE: Multiplanar and  multiecho pulse sequences of the lumbar spine were obtained without and with intravenous contrast. CONTRAST:  5 cc Gadavist IV. COMPARISON:  MRI lumbar spine 02/15/2019. CT lumbar spine this same day. FINDINGS: Segmentation:  Standard. Alignment:  Trace anterolisthesis L4 on L5.  Otherwise maintained. Vertebrae: No fracture, evidence of discitis, or bone lesion. Status post L4-5 fusion as described below. Conus medullaris and cauda equina: Conus extends to the L1 level. Conus and cauda equina appear normal. Paraspinal and other soft tissues: Left renal cyst noted. Disc levels: T10-11 and T11-12 are imaged in the sagittal plane only and negative. T12-L1: Negative. L1-2: Mild facet degenerative change.  Otherwise negative. L2-3: Minimal disc bulge and mild-to-moderate facet arthropathy. No stenosis. L3-4: There is a disc bulge eccentric to the left and mild to moderate facet arthropathy. Dorsal epidural fat is prominent. Moderate compression of the thecal sac and left worse than right  subarticular recess narrowing appear unchanged. Neural foramina are open. L4-5: Status post laminectomy and discectomy and fusion. Based on comparison with prior CT scan, acute or protruding into the disc interspace compatible with interbody graft and likely disc. Compression of the thecal sac seen on the prior MRI has markedly improved likely due to resolving postoperative edema. The central canal is now open. Mild to moderate bilateral foraminal narrowing appears greater on the left and unchanged. L5-S1: Bilateral facet degenerative change and a shallow disc bulge. The central canal is open. Marked right foraminal narrowing is unchanged. Left foramen is minimally narrowed. IMPRESSION: Status post L4-5 fusion. Compression of the thecal sac seen on the prior MRI has markedly improved since the prior examination likely due to resolving postoperative edema. Mild to moderate foraminal narrowing at this level appears greater on the left and  not notably changed. No change in marked right foraminal narrowing at L5-S1. No change in moderate compression of the thecal sac at L3-4 due to dorsal epidural fat and disc bulge to the left. Left worse than right subarticular recess narrowing at this level is also unchanged. Electronically Signed   By: Inge Rise M.D.   On: 03/12/2019 14:32   Dg Pelvis Portable  Result Date: 03/12/2019 CLINICAL DATA:  Initial evaluation for acute trauma, fall. EXAM: PORTABLE PELVIS 1-2 VIEWS COMPARISON:  None. FINDINGS: Curvilinear lucency extends through the right pubis symphysis, favored to reflect summation of overlying soft tissue shadow, although an acute nondisplaced fracture not entirely excluded. No pubic diastasis. Remainder the bony pelvis intact. SI joints approximated. No abnormality about the hips. Prior fusion noted at L4-5. Degenerative changes noted within the lower lumbar spine. Mild osteoarthritic changes about the hips. No visible soft tissue injury. Scattered vascular calcifications noted. IMPRESSION: 1. Curvilinear lucency extending through the right pubis symphysis, favored to reflect summation of overlying soft tissue shadow, although an acute nondisplaced fracture not entirely excluded. Correlation with physical exam recommended. 2. No other acute osseous abnormality about the pelvis. Electronically Signed   By: Jeannine Boga M.D.   On: 03/12/2019 04:54   Mr Sacrum Si Joints Wo Contrast  Result Date: 03/14/2019 CLINICAL DATA:  Recent fall. Tailbone pain. Evaluate for sacral insufficiency fracture. EXAM: MRI SACRUM WITHOUT CONTRAST TECHNIQUE: Multiplanar, multisequence MR imaging of the sacrum was performed. No intravenous contrast was administered. COMPARISON:  Pelvic x-rays from yesterday. CT abdomen pelvis dated February 15, 2019. FINDINGS: Bones/Joint/Cartilage Acute minimally impacted subcapital right femoral neck fracture. No sacral ala fracture. No dislocation. No joint effusion. Prior  L4-L5 fusion. Muscles and Tendons Grossly intact.  No muscle edema or atrophy. Soft tissue No fluid collection or hematoma. No soft tissue mass. Foley catheter in the bladder. IMPRESSION: 1. Acute minimally impacted subcapital right femoral neck fracture. Consider dedicated right hip x-rays for further evaluation. 2. No additional acute osseous abnormality.  No sacral ala fracture. These results will be called to the ordering clinician or representative by the Radiologist Assistant, and communication documented in the PACS or zVision Dashboard. Electronically Signed   By: Titus Dubin M.D.   On: 03/14/2019 15:40   Dg Chest Portable 1 View  Result Date: 03/12/2019 CLINICAL DATA:  Initial evaluation for acute trauma, fall. EXAM: PORTABLE CHEST 1 VIEW COMPARISON:  Prior radiograph from 09/12/2018 FINDINGS: Transverse heart size at the upper limits of normal. Mediastinal silhouette within normal limits. Aortic atherosclerosis. Lungs mildly hypoinflated. No focal infiltrates. No edema or effusion. No pneumothorax. No acute osseous abnormality. Diffuse osteopenia. IMPRESSION: 1. No  active cardiopulmonary disease. 2. Aortic atherosclerosis. Electronically Signed   By: Jeannine Boga M.D.   On: 03/12/2019 04:48   Dg C-arm 1-60 Min  Result Date: 03/15/2019 CLINICAL DATA:  RIGHT hip pinning. EXAM: DG C-ARM 61-120 MIN COMPARISON:  None. FINDINGS: Intraoperative fluoroscopic images demonstrate placement of 3 fixation screws traversing the RIGHT femoral neck. Fluoroscopy provided for 41 seconds. IMPRESSION: Fluoroscopy provided for 41 seconds. Electronically Signed   By: Franki Cabot M.D.   On: 03/15/2019 14:00   Dg Hip Operative Unilat W Or W/o Pelvis Right  Result Date: 03/15/2019 CLINICAL DATA:  Right hip pinning EXAM: OPERATIVE right HIP (WITH PELVIS IF PERFORMED) for VIEWS TECHNIQUE: Fluoroscopic spot image(s) were submitted for interpretation post-operatively. COMPARISON:  03/14/2019 FINDINGS: Images  show placement of 3 Knowles type pins for fixation of a femoral neck fracture. No radiographically detectable complication. IMPRESSION: Knowles pin placement for fixation of a right femoral neck fracture. Electronically Signed   By: Nelson Chimes M.D.   On: 03/15/2019 15:34   Dg Hip Unilat With Pelvis 2-3 Views Right  Result Date: 03/14/2019 CLINICAL DATA:  Fall several days ago with known right hip fracture EXAM: DG HIP (WITH OR WITHOUT PELVIS) 3V RIGHT COMPARISON:  03/12/2019 FINDINGS: Impacted right femoral neck fracture is seen similar to that noted on prior MRI. Postsurgical changes in the lumbar spine are seen. No other fracture is noted. IMPRESSION: Mildly impacted right femoral neck fracture. No other focal abnormality is seen. Electronically Signed   By: Inez Catalina M.D.   On: 03/14/2019 18:13   Ct Maxillofacial Wo Contrast  Result Date: 03/12/2019 CLINICAL DATA:  Pt BIB [facility] from home alone. Pt was changing her thermostat when she lost her balance and fell. Pt has hx of back surgery on May 29 EXAM: CT HEAD WITHOUT CONTRAST CT MAXILLOFACIAL WITHOUT CONTRAST CT CERVICAL SPINE WITHOUT CONTRAST TECHNIQUE: Multidetector CT imaging of the head, cervical spine, and maxillofacial structures were performed using the standard protocol without intravenous contrast. Multiplanar CT image reconstructions of the cervical spine and maxillofacial structures were also generated. COMPARISON:  09/24/2017 FINDINGS: CT HEAD FINDINGS Brain: No evidence of acute infarction, hemorrhage, hydrocephalus, extra-axial collection or mass lesion/mass effect. Vascular: No hyperdense vessel or unexpected calcification. Skull: Normal. Negative for fracture or focal lesion. Other: None. CT MAXILLOFACIAL FINDINGS Osseous: No fracture or mandibular dislocation. No destructive process. Orbits: Negative. No traumatic or inflammatory finding. Sinuses: Clear. Soft tissues: Negative. CT CERVICAL SPINE FINDINGS Alignment: Normal. Skull base  and vertebrae: No acute fracture. No primary bone lesion or focal pathologic process. Soft tissues and spinal canal: No prevertebral fluid or swelling. No visible canal hematoma. Disc levels: Mild loss of disc height at C4-C5 and C5-C6. Small endplate osteophytes at these levels. Minor disc bulging. No convincing disc herniation. There are facet degenerative changes most evident bilaterally at C5-C6. Upper chest: No acute findings. No masses or adenopathy. Mild centrilobular emphysema and scarring at the apices of the lungs. Other: None. IMPRESSION: HEAD CT 1. No intracranial abnormality. 2. No skull fracture. MAXILLOFACIAL CT 1. No fracture or acute finding. CERVICAL CT 1. No fracture or acute finding. Electronically Signed   By: Lajean Manes M.D.   On: 03/12/2019 04:39    Assessment/Plan  1. Acute blood loss anemia Lab Results  Component Value Date   HGB 9.7 (L) 03/19/2019  -  Will start on FeSO4 325 mg BID  2. Anxiety - improved, will change Clonazepam 0.5 mg from BID to BID PRN  3.  Pain on right lower extremity - pain ahs improved, will discontinue Acetaminophen Q 6 hours and will continue Norco 5-325 mg Q 8 hours PRN X 14 days   Family/ staff Communication: Discussed plan of care with resident.  Labs/tests ordered: None  Goals of care:   Short-term care   Durenda Age, DNP, FNP-BC [facility] [phone] (Monday-Friday 8:00 a.m. - 5:00 p.m.) [phone] (after hours)

## 2019-03-30 DIAGNOSIS — S72001D Fracture of unspecified part of neck of right femur, subsequent encounter for closed fracture with routine healing: Secondary | ICD-10-CM | POA: Diagnosis not present

## 2019-03-31 ENCOUNTER — Encounter: Payer: Self-pay | Admitting: Adult Health

## 2019-03-31 ENCOUNTER — Other Ambulatory Visit: Payer: Self-pay | Admitting: Adult Health

## 2019-03-31 ENCOUNTER — Non-Acute Institutional Stay (SKILLED_NURSING_FACILITY): Payer: PPO | Admitting: Adult Health

## 2019-03-31 DIAGNOSIS — N3000 Acute cystitis without hematuria: Secondary | ICD-10-CM | POA: Diagnosis not present

## 2019-03-31 DIAGNOSIS — R52 Pain, unspecified: Secondary | ICD-10-CM | POA: Insufficient documentation

## 2019-03-31 MED ORDER — HYDROCODONE-ACETAMINOPHEN 5-325 MG PO TABS: 1.0000 | ORAL_TABLET | Freq: Three times a day (TID) | ORAL | 0 refills | Status: DC | PRN

## 2019-03-31 NOTE — Progress Notes (Signed)
Location:  [facility] Room Number: 212/A Place of Service:  SNF (31) Provider:  Durenda Age, DNP, FNP-BC  Patient Care Team: Maurice Small, MD as PCP - General (Family Medicine) Jerline Pain, MD as PCP - Cardiology (Cardiology)  Extended Emergency Contact Information Primary Emergency Contact: Arlee Muslim Address: [address], [place] [address] [place] of [place] Phone: [phone] Mobile Phone: [phone] Relation: Son Secondary Emergency Contact: Delmar Landau, [place] [address] [place] of [place] Phone: [phone] Relation: Son  Code Status:  Full Code  Goals of care: Advanced Directive information Advanced Directives 03/31/2019  Does Patient Have a Medical Advance Directive? Yes  Type of Advance Directive (No Data)  Does patient want to make changes to medical advance directive? No - Patient declined  Copy of Braden in Chart? -  Would patient like information on creating a medical advance directive? -  Pre-existing out of facility DNR order (yellow form or pink MOST form) -     Chief Complaint  Patient presents with   Acute Visit    Abnormal urine culture    HPI:  Pt is a 73 y.o. female seen today for an acute visit. She was seen in her room today. She complains of having dysuria and urinary retention. She said that she feels that she does not empty her bladder completely. No fever nor hematuria. She had indwelling foley catheter when she was admitted to [facility]. Her foley catheter was seen on the floor a week ago and she claimed to ha pulled it out herself. Urine culture showed >100,000 CFU/ml lactose fermenting gram negative rods/E. Coli, urine nitrite +2, urine wbc TNTC.   Past Medical History:  Diagnosis Date   AIN grade I    Anxiety    Bruises easily    Chronic diarrhea    Chronic hyponatremia    Colitis     COPD (chronic obstructive pulmonary disease) (HCC)    Depression    GERD (gastroesophageal reflux disease)    History of chronic bronchitis    History of vulvar dysplasia    serveral recurrency's    Hypertension    LBBB (left bundle branch block) W/ PROLONGED PR   CARDIOLOGSIT-  DR Johnsie Cancel-   LOV IN EPIC   Macrocytosis    Nocturia    Osteoporosis    Seronegative rheumatoid arthritis (HCC)    Short of breath on exertion    Smokers' cough (HCC)    Venous insufficiency of leg    , Edema   VIN III (vulvar intraepithelial neoplasia III)    RECURRENT -   Past Surgical History:  Procedure Laterality Date   APPENDECTOMY  1992   CARDIOVASCULAR STRESS TEST  06-23-2012  DR Jenkins Rouge   NORMAL LEXISCAN MYOVIEW/ EF 83%/  NO ISCHEMIA   CO2 LASER APPLICATION  99/37/1696   Procedure: CO2 LASER APPLICATION;  Surgeon: Janie Morning, MD PHD;  Location: [facility];  Service: Gynecology;  Laterality: N/A;  Laser Vaporization   CO2 LASER APPLICATION N/A 03/08/9380   Procedure: CO2 LASER APPLICATION;  Surgeon: [name], MD;  Location: [facility];  Service: Gynecology;  Laterality: N/A;   CO2 LASER APPLICATION N/A 0/17/5102   Procedure: CO2 LASER APPLICATION of the vulva;  Surgeon: Janie Morning, MD;  Location: [facility];  Service: Gynecology;  Laterality: N/A;   EXCISION VULVAR LESIONS  07/2012   HEMORRHOID SURGERY N/A 04/15/2013   Procedure: Excision of external and internal  hemorrhoid with AIN, Exam under anesthesia;  Surgeon: Odis Hollingshead, MD;  Location: [facility];  Service: General;  Laterality: N/A;   HIP PINNING,CANNULATED Right 03/15/2019   Procedure: Maebelle Munroe OF FEMORAL NECK FRACTURE;  Surgeon: Renette Butters, MD;  Location: [facility];  Service: Orthopedics;  Laterality: Right;   REPAIR FISTULA IN ANO/  I & D PERIRECTAL ABSCESS  10-26-2002  DR Johnathan Hausen   TRANSTHORACIC ECHOCARDIOGRAM  06-23-2012   NORMAL LVSF/ EF  55-60%   VAGINAL HYSTERECTOMY  1997   partial   VULVAR LESION REMOVAL N/A 11/03/2014   Procedure: CO2 LASER OF VULVAR ;  Surgeon: [name], MD;  Location: [facility];  Service: Gynecology;  Laterality: N/A;   VULVECTOMY N/A 03/26/2015   Procedure: WIDE LOCA  EXCISION OF VULVAR;  Surgeon: [name], MD;  Location: [facility];  Service: Gynecology;  Laterality: N/A;   Wide Local Excision of labia majora  04-04-2010   Right-sided lesion, CO2 ablation of right labia minora    Allergies  Allergen Reactions   Latex Swelling and Other (See Comments)     fever blisters   Augmentin [Amoxicillin-Pot Clavulanate] Nausea And Vomiting   Doxycycline Other (See Comments)    Blurred vision, nervous, unsteady   Lactose Intolerance (Gi) Other (See Comments)    Unknown, per pt   Lialda [Mesalamine] Diarrhea   Prinivil [Lisinopril] Diarrhea   Triamcinolone Acetonide Other (See Comments)    REDNESS AND PAIN   Amlodipine Rash   Lotensin [Benazepril Hcl] Rash    Outpatient Encounter Medications as of 03/31/2019  Medication Sig   alendronate (FOSAMAX) 70 MG tablet Take 70 mg by mouth every Monday.    atorvastatin (LIPITOR) 40 MG tablet Take 1 tablet by mouth once daily   bisacodyl (DULCOLAX) 10 MG suppository If not relieved by MOM, give 10 mg Bisacodyl suppositiory rectally X 1 dose in 24 hours as needed (Do not use constipation standing orders for residents with renal failure/CFR less than 30. Contact MD for orders) (Physician Order)   busPIRone (BUSPAR) 30 MG tablet Take 30 mg by mouth 2 (two) times daily.    Calcium Carbonate-Vitamin D (CALTRATE 600+D PO) Take 1 tablet by mouth daily.    carvedilol (COREG) 6.25 MG tablet Take 1 tablet (6.25 mg total) by mouth 2 (two) times daily with a meal.   Cholecalciferol (VITAMIN D3) 1000 UNITS CAPS Take 1,000 Units by mouth daily.    clonazePAM (KLONOPIN) 0.5 MG tablet Take 0.5 mg by mouth 2 (two) times  daily.   diltiazem (TIAZAC) 360 MG 24 hr capsule Take 360 mg by mouth daily.    docusate sodium (COLACE) 100 MG capsule Take 1 capsule (100 mg total) by mouth 2 (two) times daily.   enoxaparin (LOVENOX) 40 MG/0.4ML injection Inject 0.4 mLs (40 mg total) into the skin daily for 30 doses. For DVT prophylaxis   ferrous sulfate 325 (65 FE) MG tablet Take 325 mg by mouth 2 (two) times daily with a meal. For Anemia   hydrALAZINE (APRESOLINE) 25 MG tablet Take 3 tablets (75 mg total) by mouth 3 (three) times daily.   HYDROcodone-acetaminophen (NORCO/VICODIN) 5-325 MG tablet Take 1 tablet by mouth every 8 (eight) hours as needed for up to 30 doses for moderate pain. For pain for 14 days   hydroxychloroquine (PLAQUENIL) 200 MG tablet  Take 200 mg by mouth daily.   magnesium hydroxide (MILK OF MAGNESIA) 400 MG/5ML suspension If no BM in 3 days, give 30 cc Milk of Magnesium p.o. x 1 dose in 24 hours as needed (Do not use standing constipation orders for residents with renal failure CFR less than 30. Contact MD for orders) (Physician Order)   methocarbamol (ROBAXIN) 500 MG tablet Take 1 tablet (500 mg total) by mouth every 8 (eight) hours as needed for muscle spasms.   nicotine (NICODERM CQ - DOSED IN MG/24 HR) 7 mg/24hr patch APPLY 1 PATCH TOPICALLY ONCE DAILY FOR SMOKING CESSATION   NON FORMULARY Apply foam dressing to sacrum 2 x week for protection   Nutritional Supplement LIQD Take 120 mLs by mouth 2 (two) times a day. MedPass   polyethylene glycol (MIRALAX / GLYCOLAX) 17 g packet Take 17 g by mouth daily as needed. For constipation   predniSONE (DELTASONE) 5 MG tablet Take 5 mg by mouth daily with breakfast. --  LONG-TERM USE FOR RA   senna-docusate (SENOKOT-S) 8.6-50 MG tablet Take 1 tablet by mouth 2 (two) times daily.   sodium chloride 1 g tablet Take 1 tablet (1 g total) by mouth 2 (two) times daily with a meal.   Sodium Phosphates (RA SALINE ENEMA RE) If not relieved by Biscodyl  suppository, give disposable Saline Enema rectally X 1 dose/24 hrs as needed (Do not use constipation standing orders for residents with renal failure/CFR less than 30. Contact MD for orders)(Physician Or   [DISCONTINUED] acetaminophen (TYLENOL) 325 MG tablet Take 2 tablets (650 mg total) by mouth every 6 (six) hours.   No facility-administered encounter medications on file as of 03/31/2019.     Review of Systems  GENERAL: No change in appetite, no fatigue, no weight changes, no fever, chills or weakness MOUTH and THROAT: Denies oral discomfort, gingival pain or bleeding RESPIRATORY: no cough, SOB, DOE, wheezing, hemoptysis CARDIAC: No chest pain, edema or palpitations GI: No abdominal pain, diarrhea, constipation, heart burn, nausea or vomiting GU: +dysuria NEUROLOGICAL: Denies dizziness, syncope, numbness, or headache PSYCHIATRIC: Denies feelings of depression or anxiety. No report of hallucinations, insomnia, paranoia, or agitation   Immunization History  Administered Date(s) Administered   Influenza, High Dose Seasonal PF 07/13/2017   Tdap 09/11/2018   Pertinent  Health Maintenance Due  Topic Date Due   COLONOSCOPY  04/21/2019 (Originally 02/23/1995)   PNA vac Low Risk Adult (1 of 2 - PCV13) 04/21/2019 (Originally 02/22/2010)   INFLUENZA VACCINE  04/02/2019   MAMMOGRAM  08/12/2020   DEXA SCAN  Completed   No flowsheet data found.   Vitals:   03/31/19 1644  BP: (!) 170/50  Pulse: 95  Resp: 20  Temp: 97.6 F (36.4 C)  TempSrc: Oral  SpO2: 97%  Weight: 104 lb 14.4 oz (47.6 kg)  Height: 5\' 4"  (1.626 m)   Body mass index is 18.01 kg/m.  Physical Exam  GENERAL APPEARANCE:  In no acute distress.  SKIN:  Skin is warm and dry.  MOUTH and THROAT: Lips are without lesions. Oral mucosa is moist and without lesions. Tongue is normal in shape, size, and color and without lesions RESPIRATORY: Breathing is even & unlabored, BS CTAB CARDIAC: RRR, no murmur,no extra  heart sounds, no edema GI: Abdomen soft, normal BS, no masses, no tenderness EXTREMITIES:  Able to move X 4 extremities NEUROLOGICAL: There is no tremor. Speech is clear. Alert and oriented X 3. PSYCHIATRIC:  Affect and behavior are appropriate  Labs  reviewed: Recent Labs    02/21/19 0559 02/22/19 0630  03/13/19 0246  03/17/19 0332 03/18/19 0243 03/19/19 0255  NA 125* 126*   < > 118*   < > 126* 123* 127*  K 4.4 3.8   < > 3.9   < > 4.6 4.0 3.9  CL 99 97*   < > 88*   < > 92* 90* 94*  CO2 21* 21*   < > 20*   < > 21* 24 25  GLUCOSE 111* 86   < > 87   < > 111* 96 108*  BUN 24* 24*   < > 14   < > 25* 20 17  CREATININE 1.15* 1.06*   < > 1.03*   < > 0.99 0.91 0.86  CALCIUM 8.3* 8.5*   < > 8.1*   < > 9.0 8.4* 8.7*  MG 1.8 1.8  --  2.2  --   --   --   --    < > = values in this interval not displayed.   No results for input(s): AST, ALT, ALKPHOS, BILITOT, PROT, ALBUMIN in the last 8760 hours. Recent Labs    01/28/19 1001  02/14/19 2059  03/12/19 0403  03/18/19 0243 03/18/19 1806 03/19/19 0255  WBC 9.6   < > 9.8   < > 11.9*   < > 6.4 7.5 7.7  NEUTROABS 7.5  --  8.5*  --  9.9*  --   --   --   --   HGB 12.3   < > 8.6*   < > 10.8*   < > 7.0* 10.0* 9.7*  HCT 34.6*   < > 24.2*   < > 29.8*   < > 19.3* 28.1* 27.3*  MCV 100.3*   < > 101.3*   < > 100.7*   < > 101.0* 95.3 95.1  PLT 190   < > 221   < > 225   < > 200 209 206   < > = values in this interval not displayed.   Lab Results  Component Value Date   TSH 0.944 02/19/2019   Lab Results  Component Value Date   HGBA1C 5.4 09/24/2017   Lab Results  Component Value Date   CHOL 143 09/24/2017   HDL 96 09/24/2017   LDLCALC 42 09/24/2017   TRIG 24 09/24/2017   CHOLHDL 1.5 09/24/2017    Significant Diagnostic Results in last 30 days:  Dg Pelvis 1-2 Views  Result Date: 03/13/2019 CLINICAL DATA:  Pelvic pain. Status post fall last night. Initial encounter. EXAM: PELVIS - 1-2 VIEW COMPARISON:  Single-view of the pelvis 03/12/2019.  CT abdomen and pelvis 02/15/2019. FINDINGS: Possible right parasymphyseal pubic bone fracture seen on yesterday's examination is not visible today. No acute bony or joint abnormality is identified. The patient is status post L4-5 fusion. IMPRESSION: No acute abnormality. Possible right parasymphyseal pubic bone fracture on yesterday's examination is likely due to overlapping shadows. Electronically Signed   By: Inge Rise M.D.   On: 03/13/2019 14:08   Dg Elbow Complete Left  Result Date: 03/12/2019 CLINICAL DATA:  Initial evaluation for acute trauma, fall. EXAM: LEFT ELBOW - COMPLETE 3+ VIEW COMPARISON:  None. FINDINGS: There is no evidence of fracture, dislocation, or joint effusion. There is no evidence of arthropathy or other focal bone abnormality. Soft tissues are unremarkable. IMPRESSION: No acute osseous abnormality about the left elbow. Electronically Signed   By: Jeannine Boga M.D.   On: 03/12/2019 04:50  Dg Elbow Complete Right  Result Date: 03/12/2019 CLINICAL DATA:  Initial evaluation for acute trauma, fall. EXAM: RIGHT ELBOW - COMPLETE 3+ VIEW COMPARISON:  None. FINDINGS: No acute fracture dislocation. No joint effusion. Radial head intact. Mild degenerative spurring present about the elbow. No visible soft tissue injury. IMPRESSION: No acute osseous abnormality about the right elbow. Electronically Signed   By: Jeannine Boga M.D.   On: 03/12/2019 04:52   Ct Head Wo Contrast  Result Date: 03/12/2019 CLINICAL DATA:  Pt BIB [facility] from home alone. Pt was changing her thermostat when she lost her balance and fell. Pt has hx of back surgery on May 29 EXAM: CT HEAD WITHOUT CONTRAST CT MAXILLOFACIAL WITHOUT CONTRAST CT CERVICAL SPINE WITHOUT CONTRAST TECHNIQUE: Multidetector CT imaging of the head, cervical spine, and maxillofacial structures were performed using the standard protocol without intravenous contrast. Multiplanar CT image reconstructions of the cervical spine and  maxillofacial structures were also generated. COMPARISON:  09/24/2017 FINDINGS: CT HEAD FINDINGS Brain: No evidence of acute infarction, hemorrhage, hydrocephalus, extra-axial collection or mass lesion/mass effect. Vascular: No hyperdense vessel or unexpected calcification. Skull: Normal. Negative for fracture or focal lesion. Other: None. CT MAXILLOFACIAL FINDINGS Osseous: No fracture or mandibular dislocation. No destructive process. Orbits: Negative. No traumatic or inflammatory finding. Sinuses: Clear. Soft tissues: Negative. CT CERVICAL SPINE FINDINGS Alignment: Normal. Skull base and vertebrae: No acute fracture. No primary bone lesion or focal pathologic process. Soft tissues and spinal canal: No prevertebral fluid or swelling. No visible canal hematoma. Disc levels: Mild loss of disc height at C4-C5 and C5-C6. Small endplate osteophytes at these levels. Minor disc bulging. No convincing disc herniation. There are facet degenerative changes most evident bilaterally at C5-C6. Upper chest: No acute findings. No masses or adenopathy. Mild centrilobular emphysema and scarring at the apices of the lungs. Other: None. IMPRESSION: HEAD CT 1. No intracranial abnormality. 2. No skull fracture. MAXILLOFACIAL CT 1. No fracture or acute finding. CERVICAL CT 1. No fracture or acute finding. Electronically Signed   By: Lajean Manes M.D.   On: 03/12/2019 04:39   Ct Cervical Spine Wo Contrast  Result Date: 03/12/2019 CLINICAL DATA:  Pt BIB [facility] from home alone. Pt was changing her thermostat when she lost her balance and fell. Pt has hx of back surgery on May 29 EXAM: CT HEAD WITHOUT CONTRAST CT MAXILLOFACIAL WITHOUT CONTRAST CT CERVICAL SPINE WITHOUT CONTRAST TECHNIQUE: Multidetector CT imaging of the head, cervical spine, and maxillofacial structures were performed using the standard protocol without intravenous contrast. Multiplanar CT image reconstructions of the cervical spine and maxillofacial structures were  also generated. COMPARISON:  09/24/2017 FINDINGS: CT HEAD FINDINGS Brain: No evidence of acute infarction, hemorrhage, hydrocephalus, extra-axial collection or mass lesion/mass effect. Vascular: No hyperdense vessel or unexpected calcification. Skull: Normal. Negative for fracture or focal lesion. Other: None. CT MAXILLOFACIAL FINDINGS Osseous: No fracture or mandibular dislocation. No destructive process. Orbits: Negative. No traumatic or inflammatory finding. Sinuses: Clear. Soft tissues: Negative. CT CERVICAL SPINE FINDINGS Alignment: Normal. Skull base and vertebrae: No acute fracture. No primary bone lesion or focal pathologic process. Soft tissues and spinal canal: No prevertebral fluid or swelling. No visible canal hematoma. Disc levels: Mild loss of disc height at C4-C5 and C5-C6. Small endplate osteophytes at these levels. Minor disc bulging. No convincing disc herniation. There are facet degenerative changes most evident bilaterally at C5-C6. Upper chest: No acute findings. No masses or adenopathy. Mild centrilobular emphysema and scarring at the apices of the lungs. Other:  None. IMPRESSION: HEAD CT 1. No intracranial abnormality. 2. No skull fracture. MAXILLOFACIAL CT 1. No fracture or acute finding. CERVICAL CT 1. No fracture or acute finding. Electronically Signed   By: Lajean Manes M.D.   On: 03/12/2019 04:39   Ct Lumbar Spine Wo Contrast  Result Date: 03/12/2019 CLINICAL DATA:  Per ed notes: Pt BIB [facility] from home alone. Pt was changing her thermostat when she lost her balance and fell. Pt has hx of back surgery on May 29 EXAM: CT LUMBAR SPINE WITHOUT CONTRAST TECHNIQUE: Multidetector CT imaging of the lumbar spine was performed without intravenous contrast administration. Multiplanar CT image reconstructions were also generated. COMPARISON:  CT, 02/15/2019 FINDINGS: Segmentation: 5 lumbar type vertebrae. Alignment: Normal. Vertebrae: Choose 1 Paraspinal and other soft tissues: No masses or  adenopathy. Chronic bilateral perinephric stranding similar to the prior CT. Dense aortic atherosclerotic calcifications. Disc levels: T12-L1 through L2-L3: No disc bulging or herniation. No stenosis. L3-L4: Mild spondylotic disc bulging. No disc herniation. No significant stenosis. L4-L5: Bilateral pedicle screws and interconnecting rods fuse L4-L5. The orthopedic hardware is well-seated and unchanged from the prior abdomen and pelvis CT. There is a disc spacer and bone graft material within the L4-L5 disc interspace and along the posterior elements. This all appears stable from the prior study. L5-S1: Moderate loss of disc height. Mild facet degenerative change. Spondylotic disc bulging, but no disc herniation. Mild right neural foraminal narrowing. IMPRESSION: 1. No fracture or acute finding. 2. Status post posterior lumbar spine fusion at L4-L5. No evidence of disruption of the orthopedic hardware. No change from the study dated 02/15/2019. Electronically Signed   By: Lajean Manes M.D.   On: 03/12/2019 04:44   Mr Lumbar Spine W Wo Contrast  Result Date: 03/12/2019 CLINICAL DATA:  Patient status post L4-5 fusion 01/28/2019. Low back and left leg pain since the procedure. EXAM: MRI LUMBAR SPINE WITHOUT AND WITH CONTRAST TECHNIQUE: Multiplanar and multiecho pulse sequences of the lumbar spine were obtained without and with intravenous contrast. CONTRAST:  5 cc Gadavist IV. COMPARISON:  MRI lumbar spine 02/15/2019. CT lumbar spine this same day. FINDINGS: Segmentation:  Standard. Alignment:  Trace anterolisthesis L4 on L5.  Otherwise maintained. Vertebrae: No fracture, evidence of discitis, or bone lesion. Status post L4-5 fusion as described below. Conus medullaris and cauda equina: Conus extends to the L1 level. Conus and cauda equina appear normal. Paraspinal and other soft tissues: Left renal cyst noted. Disc levels: T10-11 and T11-12 are imaged in the sagittal plane only and negative. T12-L1: Negative.  L1-2: Mild facet degenerative change.  Otherwise negative. L2-3: Minimal disc bulge and mild-to-moderate facet arthropathy. No stenosis. L3-4: There is a disc bulge eccentric to the left and mild to moderate facet arthropathy. Dorsal epidural fat is prominent. Moderate compression of the thecal sac and left worse than right subarticular recess narrowing appear unchanged. Neural foramina are open. L4-5: Status post laminectomy and discectomy and fusion. Based on comparison with prior CT scan, acute or protruding into the disc interspace compatible with interbody graft and likely disc. Compression of the thecal sac seen on the prior MRI has markedly improved likely due to resolving postoperative edema. The central canal is now open. Mild to moderate bilateral foraminal narrowing appears greater on the left and unchanged. L5-S1: Bilateral facet degenerative change and a shallow disc bulge. The central canal is open. Marked right foraminal narrowing is unchanged. Left foramen is minimally narrowed. IMPRESSION: Status post L4-5 fusion. Compression of the thecal sac seen  on the prior MRI has markedly improved since the prior examination likely due to resolving postoperative edema. Mild to moderate foraminal narrowing at this level appears greater on the left and not notably changed. No change in marked right foraminal narrowing at L5-S1. No change in moderate compression of the thecal sac at L3-4 due to dorsal epidural fat and disc bulge to the left. Left worse than right subarticular recess narrowing at this level is also unchanged. Electronically Signed   By: Inge Rise M.D.   On: 03/12/2019 14:32   Dg Pelvis Portable  Result Date: 03/12/2019 CLINICAL DATA:  Initial evaluation for acute trauma, fall. EXAM: PORTABLE PELVIS 1-2 VIEWS COMPARISON:  None. FINDINGS: Curvilinear lucency extends through the right pubis symphysis, favored to reflect summation of overlying soft tissue shadow, although an acute  nondisplaced fracture not entirely excluded. No pubic diastasis. Remainder the bony pelvis intact. SI joints approximated. No abnormality about the hips. Prior fusion noted at L4-5. Degenerative changes noted within the lower lumbar spine. Mild osteoarthritic changes about the hips. No visible soft tissue injury. Scattered vascular calcifications noted. IMPRESSION: 1. Curvilinear lucency extending through the right pubis symphysis, favored to reflect summation of overlying soft tissue shadow, although an acute nondisplaced fracture not entirely excluded. Correlation with physical exam recommended. 2. No other acute osseous abnormality about the pelvis. Electronically Signed   By: Jeannine Boga M.D.   On: 03/12/2019 04:54   Mr Sacrum Si Joints Wo Contrast  Result Date: 03/14/2019 CLINICAL DATA:  Recent fall. Tailbone pain. Evaluate for sacral insufficiency fracture. EXAM: MRI SACRUM WITHOUT CONTRAST TECHNIQUE: Multiplanar, multisequence MR imaging of the sacrum was performed. No intravenous contrast was administered. COMPARISON:  Pelvic x-rays from yesterday. CT abdomen pelvis dated February 15, 2019. FINDINGS: Bones/Joint/Cartilage Acute minimally impacted subcapital right femoral neck fracture. No sacral ala fracture. No dislocation. No joint effusion. Prior L4-L5 fusion. Muscles and Tendons Grossly intact.  No muscle edema or atrophy. Soft tissue No fluid collection or hematoma. No soft tissue mass. Foley catheter in the bladder. IMPRESSION: 1. Acute minimally impacted subcapital right femoral neck fracture. Consider dedicated right hip x-rays for further evaluation. 2. No additional acute osseous abnormality.  No sacral ala fracture. These results will be called to the ordering clinician or representative by the Radiologist Assistant, and communication documented in the PACS or zVision Dashboard. Electronically Signed   By: Titus Dubin M.D.   On: 03/14/2019 15:40   Dg Chest Portable 1 View  Result  Date: 03/12/2019 CLINICAL DATA:  Initial evaluation for acute trauma, fall. EXAM: PORTABLE CHEST 1 VIEW COMPARISON:  Prior radiograph from 09/12/2018 FINDINGS: Transverse heart size at the upper limits of normal. Mediastinal silhouette within normal limits. Aortic atherosclerosis. Lungs mildly hypoinflated. No focal infiltrates. No edema or effusion. No pneumothorax. No acute osseous abnormality. Diffuse osteopenia. IMPRESSION: 1. No active cardiopulmonary disease. 2. Aortic atherosclerosis. Electronically Signed   By: Jeannine Boga M.D.   On: 03/12/2019 04:48   Dg C-arm 1-60 Min  Result Date: 03/15/2019 CLINICAL DATA:  RIGHT hip pinning. EXAM: DG C-ARM 61-120 MIN COMPARISON:  None. FINDINGS: Intraoperative fluoroscopic images demonstrate placement of 3 fixation screws traversing the RIGHT femoral neck. Fluoroscopy provided for 41 seconds. IMPRESSION: Fluoroscopy provided for 41 seconds. Electronically Signed   By: Franki Cabot M.D.   On: 03/15/2019 14:00   Dg Hip Operative Unilat W Or W/o Pelvis Right  Result Date: 03/15/2019 CLINICAL DATA:  Right hip pinning EXAM: OPERATIVE right HIP (WITH PELVIS IF PERFORMED)  for VIEWS TECHNIQUE: Fluoroscopic spot image(s) were submitted for interpretation post-operatively. COMPARISON:  03/14/2019 FINDINGS: Images show placement of 3 Knowles type pins for fixation of a femoral neck fracture. No radiographically detectable complication. IMPRESSION: Knowles pin placement for fixation of a right femoral neck fracture. Electronically Signed   By: Nelson Chimes M.D.   On: 03/15/2019 15:34   Dg Hip Unilat With Pelvis 2-3 Views Right  Result Date: 03/14/2019 CLINICAL DATA:  Fall several days ago with known right hip fracture EXAM: DG HIP (WITH OR WITHOUT PELVIS) 3V RIGHT COMPARISON:  03/12/2019 FINDINGS: Impacted right femoral neck fracture is seen similar to that noted on prior MRI. Postsurgical changes in the lumbar spine are seen. No other fracture is noted.  IMPRESSION: Mildly impacted right femoral neck fracture. No other focal abnormality is seen. Electronically Signed   By: Inez Catalina M.D.   On: 03/14/2019 18:13   Ct Maxillofacial Wo Contrast  Result Date: 03/12/2019 CLINICAL DATA:  Pt BIB [facility] from home alone. Pt was changing her thermostat when she lost her balance and fell. Pt has hx of back surgery on May 29 EXAM: CT HEAD WITHOUT CONTRAST CT MAXILLOFACIAL WITHOUT CONTRAST CT CERVICAL SPINE WITHOUT CONTRAST TECHNIQUE: Multidetector CT imaging of the head, cervical spine, and maxillofacial structures were performed using the standard protocol without intravenous contrast. Multiplanar CT image reconstructions of the cervical spine and maxillofacial structures were also generated. COMPARISON:  09/24/2017 FINDINGS: CT HEAD FINDINGS Brain: No evidence of acute infarction, hemorrhage, hydrocephalus, extra-axial collection or mass lesion/mass effect. Vascular: No hyperdense vessel or unexpected calcification. Skull: Normal. Negative for fracture or focal lesion. Other: None. CT MAXILLOFACIAL FINDINGS Osseous: No fracture or mandibular dislocation. No destructive process. Orbits: Negative. No traumatic or inflammatory finding. Sinuses: Clear. Soft tissues: Negative. CT CERVICAL SPINE FINDINGS Alignment: Normal. Skull base and vertebrae: No acute fracture. No primary bone lesion or focal pathologic process. Soft tissues and spinal canal: No prevertebral fluid or swelling. No visible canal hematoma. Disc levels: Mild loss of disc height at C4-C5 and C5-C6. Small endplate osteophytes at these levels. Minor disc bulging. No convincing disc herniation. There are facet degenerative changes most evident bilaterally at C5-C6. Upper chest: No acute findings. No masses or adenopathy. Mild centrilobular emphysema and scarring at the apices of the lungs. Other: None. IMPRESSION: HEAD CT 1. No intracranial abnormality. 2. No skull fracture. MAXILLOFACIAL CT 1. No fracture or  acute finding. CERVICAL CT 1. No fracture or acute finding. Electronically Signed   By: Lajean Manes M.D.   On: 03/12/2019 04:39    Assessment/Plan  1. Acute cystitis without hematuria -  Will start on Cipro 500 m BID X 5 days and Florastor 250 mg BID X 8 days   Family/ staff Communication: Discussed plan of care with resident.  Labs/tests ordered:  None  Goals of care:  Short-term care   Durenda Age, DNP, FNP-BC [facility] [phone] (Monday-Friday 8:00 a.m. - 5:00 p.m.) [phone] (after hours)

## 2019-04-02 DIAGNOSIS — N390 Urinary tract infection, site not specified: Secondary | ICD-10-CM | POA: Insufficient documentation

## 2019-04-12 ENCOUNTER — Encounter: Payer: Self-pay | Admitting: Adult Health

## 2019-04-12 ENCOUNTER — Non-Acute Institutional Stay (SKILLED_NURSING_FACILITY): Payer: PPO | Admitting: Adult Health

## 2019-04-12 DIAGNOSIS — E871 Hypo-osmolality and hyponatremia: Secondary | ICD-10-CM | POA: Diagnosis not present

## 2019-04-12 DIAGNOSIS — E2740 Unspecified adrenocortical insufficiency: Secondary | ICD-10-CM

## 2019-04-12 DIAGNOSIS — R600 Localized edema: Secondary | ICD-10-CM

## 2019-04-12 DIAGNOSIS — I10 Essential (primary) hypertension: Secondary | ICD-10-CM | POA: Diagnosis not present

## 2019-04-12 DIAGNOSIS — K5900 Constipation, unspecified: Secondary | ICD-10-CM

## 2019-04-12 LAB — BASIC METABOLIC PANEL
BUN: 18 (ref 4–21)
Creatinine: 0.7 (ref 0.5–1.1)
Glucose: 117
Potassium: 4.3 (ref 3.4–5.3)
Sodium: 129 — AB (ref 137–147)

## 2019-04-12 NOTE — Progress Notes (Signed)
Location:  [facility] Room Number: 212/A Place of Service:  SNF (31) Provider:  Durenda Age, DNP, FNP-BC  Patient Care Team: Maurice Small, MD as PCP - General (Family Medicine) Jerline Pain, MD as PCP - Cardiology (Cardiology)  Extended Emergency Contact Information Primary Emergency Contact: Arlee Muslim Address: [address], [place] [address] [place] of [place] Phone: [phone] Mobile Phone: [phone] Relation: Son Secondary Emergency Contact: Delmar Landau,  [address] [place] Phone: [phone] Relation: Son  Code Status:  Full Code  Goals of care: Advanced Directive information Advanced Directives 04/12/2019  Does Patient Have a Medical Advance Directive? Yes  Type of Advance Directive (No Data)  Does patient want to make changes to medical advance directive? No - Patient declined  Copy of [place] in Chart? -  Would patient like information on creating a medical advance directive? -  Pre-existing out of facility DNR order (yellow form or pink MOST form) -     Chief Complaint  Patient presents with  . Acute Visit    Bilateral Lower Extermity Swelling    HPI:  Pt is a 73 y.o. female seen today for complaints of bilateral lower extremity swelling. She is currently taking NaCl 1 gm BID for hyponatremia. A stat BMP taken showed sodium level 129. She claims she never had lower extremity edema until when she started taking the NaCl. She has 1+ BLE edema. No redness. She said that she is constipated. BPs noted to be elevated -160/70, 159/67, 144/70, 147/73.  She denies having headaches.  She is currently taking diltiazem, carvedilol and hydralazine for hypertension.  She has PMH of chronic hyponatremia, COPD, depression, GERD and hypertension.  Past Medical History:  Diagnosis Date  . AIN grade I   . Anxiety   . Bruises easily   . Chronic  diarrhea   . Chronic hyponatremia   . Colitis   . COPD (chronic obstructive pulmonary disease) ([place])   . Depression   . GERD (gastroesophageal reflux disease)   . History of chronic bronchitis   . History of vulvar dysplasia    serveral recurrency's   . Hypertension   . LBBB (left bundle branch block) W/ PROLONGED PR   CARDIOLOGSIT-  DR Johnsie Cancel-   LOV IN EPIC  . Macrocytosis   . Nocturia   . Osteoporosis   . Seronegative rheumatoid arthritis ([place])   . Short of breath on exertion   . Smokers' cough ([place])   . Venous insufficiency of leg    , Edema  . VIN III (vulvar intraepithelial neoplasia III)    RECURRENT -   Past Surgical History:  Procedure Laterality Date  . APPENDECTOMY  1992  . CARDIOVASCULAR STRESS TEST  06-23-2012  DR Jenkins [place]   NORMAL LEXISCAN MYOVIEW/ EF 83%/  NO ISCHEMIA  . CO2 LASER APPLICATION  28/41/3244   Procedure: CO2 LASER APPLICATION;  Surgeon: Janie Morning, MD PHD;  Location: [facility];  Service: Gynecology;  Laterality: N/A;  Laser Vaporization  . CO2 LASER APPLICATION N/A 0/09/270   Procedure: CO2 LASER APPLICATION;  Surgeon: [name], MD;  Location: [facility];  Service: Gynecology;  Laterality: N/A;  . CO2 LASER APPLICATION N/A 5/36/6440   Procedure: CO2 LASER APPLICATION of the vulva;  Surgeon: Janie Morning, MD;  Location: [facility];  Service: Gynecology;  Laterality: N/A;  . EXCISION VULVAR LESIONS  07/2012  . HEMORRHOID SURGERY N/A 04/15/2013   Procedure: Excision of external and internal  hemorrhoid with AIN, Exam under anesthesia;  Surgeon: Odis Hollingshead, MD;  Location: [facility];  Service: General;  Laterality: N/A;  . HIP PINNING,CANNULATED Right 03/15/2019   Procedure: Maebelle Munroe OF FEMORAL NECK FRACTURE;  Surgeon: Renette Butters, MD;  Location: [facility];  Service: Orthopedics;  Laterality: Right;  . REPAIR FISTULA IN ANO/  I & D PERIRECTAL ABSCESS  10-26-2002  DR Johnathan Hausen  .  TRANSTHORACIC ECHOCARDIOGRAM  06-23-2012   NORMAL LVSF/ EF 55-60%  . VAGINAL HYSTERECTOMY  1997   partial  . VULVAR LESION REMOVAL N/A 11/03/2014   Procedure: CO2 LASER OF VULVAR ;  Surgeon: [name], MD;  Location[facility];  Service: Gynecology;  Laterality: N/A;  . VULVECTOMY N/A 03/26/2015   Procedure: WIDE LOCA  EXCISION OF VULVAR;  Surgeon: [name], MD;  Location: [facility];  Service: Gynecology;  Laterality: N/A;  . Wide Local Excision of labia majora  04-04-2010   Right-sided lesion, CO2 ablation of right labia minora    Allergies  Allergen Reactions  . Latex Swelling and Other (See Comments)     fever blisters  . Augmentin [Amoxicillin-Pot Clavulanate] Nausea And Vomiting  . Doxycycline Other (See Comments)    Blurred vision, nervous, unsteady  . Lactose Intolerance (Gi) Other (See Comments)    Unknown, per pt  . Lialda [Mesalamine] Diarrhea  . Prinivil [Lisinopril] Diarrhea  . Triamcinolone Acetonide Other (See Comments)    REDNESS AND PAIN  . Amlodipine Rash  . Lotensin [Benazepril Hcl] Rash    Outpatient Encounter Medications as of 04/12/2019  Medication Sig  . alendronate (FOSAMAX) 70 MG tablet Take 70 mg by mouth every Monday.   Marland Kitchen atorvastatin (LIPITOR) 40 MG tablet Take 1 tablet by mouth once daily  . bisacodyl (DULCOLAX) 10 MG suppository If not relieved by MOM, give 10 mg Bisacodyl suppositiory rectally X 1 dose in 24 hours as needed (Do not use constipation standing orders for residents with renal failure/CFR less than 30. Contact MD for orders) (Physician Order)  . busPIRone (BUSPAR) 30 MG tablet Take 30 mg by mouth 2 (two) times daily.   . Calcium Carbonate-Vitamin D (CALTRATE 600+D PO) Take 1 tablet by mouth daily.   . carvedilol (COREG) 6.25 MG tablet Take 1 tablet (6.25 mg total) by mouth 2 (two) times daily with a meal.  . Cholecalciferol (VITAMIN D3) 1000 UNITS CAPS Take 1,000 Units by mouth daily.   Marland Kitchen diltiazem  (TIAZAC) 360 MG 24 hr capsule Take 360 mg by mouth daily.   Marland Kitchen enoxaparin (LOVENOX) 40 MG/0.4ML injection Inject 0.4 mLs (40 mg total) into the skin daily for 30 doses. For DVT prophylaxis  . ferrous sulfate 325 (65 FE) MG tablet Take 325 mg by mouth 2 (two) times daily with a meal. For Anemia  . hydrALAZINE (APRESOLINE) 25 MG tablet Take 3 tablets (75 mg total) by mouth 3 (three) times daily.  Marland Kitchen HYDROcodone-acetaminophen (NORCO/VICODIN) 5-325 MG tablet Take 1 tablet by mouth every 8 (eight) hours as needed for up to 30 doses for moderate pain. For pain for 14 days  . HYDROcodone-acetaminophen (NORCO/VICODIN) 5-325 MG tablet Give 1 tab Q24H PRN for pain x 5 days then d/c.  . hydroxychloroquine (PLAQUENIL) 200 MG tablet Take 200 mg by mouth daily.  . magnesium hydroxide (MILK OF MAGNESIA) 400  MG/5ML suspension If no BM in 3 days, give 30 cc Milk of Magnesium p.o. x 1 dose in 24 hours as needed (Do not use standing constipation orders for residents with renal failure CFR less than 30. Contact MD for orders) (Physician Order)  . methocarbamol (ROBAXIN) 500 MG tablet Take 1 tablet (500 mg total) by mouth every 8 (eight) hours as needed for muscle spasms.  . nicotine (NICODERM CQ - DOSED IN MG/24 HR) 7 mg/24hr patch APPLY 1 PATCH TOPICALLY ONCE DAILY FOR SMOKING CESSATION  . NON FORMULARY Apply foam dressing to sacrum 2 x week for protection  . Nutritional Supplement LIQD Take 120 mLs by mouth 2 (two) times a day. Med[employer]  . polyethylene glycol (MIRALAX / GLYCOLAX) 17 g packet Take 17 g by mouth daily as needed. For constipation  . predniSONE (DELTASONE) 5 MG tablet Take 5 mg by mouth daily with breakfast. --  LONG-TERM USE FOR RA  . sodium chloride 1 g tablet Take 1 tablet (1 g total) by mouth 2 (two) times daily with a meal.  . Sodium Phosphates (RA SALINE ENEMA RE) If not relieved by Biscodyl suppository, give disposable Saline Enema rectally X 1 dose/24 hrs as needed (Do not use constipation standing  orders for residents with renal failure/CFR less than 30. Contact MD for orders)(Physician Or  . [DISCONTINUED] clonazePAM (KLONOPIN) 0.5 MG tablet Take 0.5 mg by mouth 2 (two) times daily.  . [DISCONTINUED] docusate sodium (COLACE) 100 MG capsule Take 1 capsule (100 mg total) by mouth 2 (two) times daily.  . [DISCONTINUED] senna-docusate (SENOKOT-S) 8.6-50 MG tablet Take 1 tablet by mouth 2 (two) times daily.   No facility-administered encounter medications on file as of 04/12/2019.     Review of Systems  GENERAL: No change in appetite, no fatigue, no weight changes, no fever, chills or weakness MOUTH and THROAT: Denies oral discomfort, gingival pain or bleeding, pain from teeth or hoarseness   RESPIRATORY: no cough, SOB, DOE, wheezing, hemoptysis CARDIAC: No chest pain, or palpitations, +edema GI: +constipation GU: Denies dysuria, frequency, hematuria, incontinence, or discharge NEUROLOGICAL: Denies dizziness, syncope, numbness, or headache PSYCHIATRIC: Denies feelings of depression or anxiety. No report of hallucinations, insomnia, paranoia, or agitation    Immunization History  Administered Date(s) Administered  . Influenza, High Dose Seasonal PF 07/13/2017  . Tdap 09/11/2018   Pertinent  Health Maintenance Due  Topic Date Due  . COLONOSCOPY  04/21/2019 (Originally 02/23/1995)  . PNA vac Low Risk Adult (1 of 2 - PCV13) 04/21/2019 (Originally 02/22/2010)  . INFLUENZA VACCINE  05/13/2019 (Originally 04/02/2019)  . MAMMOGRAM  08/12/2020  . DEXA SCAN  Completed   No flowsheet data found.   Vitals:   04/12/19 1200  BP: (!) 160/70  Pulse: 73  Resp: 18  Temp: 97.7 F (36.5 C)  TempSrc: Oral  SpO2: 97%  Weight: 94 lb 12.8 oz (43 kg)  Height: 5\' 4"  (1.626 m)   Body mass index is 16.27 kg/m.  Physical Exam  GENERAL APPEARANCE: In no acute distress.  SKIN:  Skin is warm and dry.  MOUTH and THROAT: Lips are without lesions. Oral mucosa is moist and without lesions. Tongue  is normal in shape, size, and color and without lesions RESPIRATORY: Breathing is even & unlabored, BS CTAB CARDIAC: RRR, no murmur,no extra heart sounds, BLE 1+ edema GI: Abdomen soft, normal BS, no masses, no tenderness EXTREMITIES:  Able to move X 4 extremities NEUROLOGICAL: There is no tremor. Speech is clear. Alert and  oriented X 3. PSYCHIATRIC:  Affect and behavior are appropriate  Labs reviewed: Recent Labs    02/21/19 0559 02/22/19 0630  03/13/19 0246  03/17/19 0332 03/18/19 0243 03/19/19 0255 03/29/19  NA 125* 126*   < > 118*   < > 126* 123* 127* 128*  K 4.4 3.8   < > 3.9   < > 4.6 4.0 3.9 3.7  CL 99 97*   < > 88*   < > 92* 90* 94*  --   CO2 21* 21*   < > 20*   < > 21* 24 25  --   GLUCOSE 111* 86   < > 87   < > 111* 96 108*  --   BUN 24* 24*   < > 14   < > 25* 20 17 15   CREATININE 1.15* 1.06*   < > 1.03*   < > 0.99 0.91 0.86 0.9  CALCIUM 8.3* 8.5*   < > 8.1*   < > 9.0 8.4* 8.7*  --   MG 1.8 1.8  --  2.2  --   --   --   --   --    < > = values in this interval not displayed.    Recent Labs    02/14/19 2059  03/12/19 0403  03/18/19 0243 03/18/19 1806 03/19/19 0255 03/29/19  WBC 9.8   < > 11.9*   < > 6.4 7.5 7.7 5.8  NEUTROABS 8.5*  --  9.9*  --   --   --   --  3  HGB 8.6*   < > 10.8*   < > 7.0* 10.0* 9.7* 9.9*  HCT 24.2*   < > 29.8*   < > 19.3* 28.1* 27.3* 28*  MCV 101.3*   < > 100.7*   < > 101.0* 95.3 95.1  --   PLT 221   < > 225   < > 200 209 206 251   < > = values in this interval not displayed.   Lab Results  Component Value Date   TSH 0.944 02/19/2019   Lab Results  Component Value Date   HGBA1C 5.4 09/24/2017   Lab Results  Component Value Date   CHOL 143 09/24/2017   HDL 96 09/24/2017   LDLCALC 42 09/24/2017   TRIG 24 09/24/2017   CHOLHDL 1.5 09/24/2017    Significant Diagnostic Results in last 30 days:  Dg Pelvis 1-2 Views  Result Date: 03/13/2019 CLINICAL DATA:  Pelvic pain. Status post fall last night. Initial encounter. EXAM: PELVIS -  1-2 VIEW COMPARISON:  Single-view of the pelvis 03/12/2019. CT abdomen and pelvis 02/15/2019. FINDINGS: Possible right parasymphyseal pubic bone fracture seen on yesterday's examination is not visible today. No acute bony or joint abnormality is identified. The patient is status post L4-5 fusion. IMPRESSION: No acute abnormality. Possible right parasymphyseal pubic bone fracture on yesterday's examination is likely due to overlapping shadows. Electronically Signed   By: Inge Rise M.D.   On: 03/13/2019 14:08   Mr Sacrum Si Joints Wo Contrast  Result Date: 03/14/2019 CLINICAL DATA:  Recent fall. Tailbone pain. Evaluate for sacral insufficiency fracture. EXAM: MRI SACRUM WITHOUT CONTRAST TECHNIQUE: Multiplanar, multisequence MR imaging of the sacrum was performed. No intravenous contrast was administered. COMPARISON:  Pelvic x-rays from yesterday. CT abdomen pelvis dated February 15, 2019. FINDINGS: Bones/Joint/Cartilage Acute minimally impacted subcapital right femoral neck fracture. No sacral ala fracture. No dislocation. No joint effusion. Prior L4-L5 fusion. Muscles and Tendons Grossly intact.  No muscle edema or atrophy. Soft tissue No fluid collection or hematoma. No soft tissue mass. Foley catheter in the bladder. IMPRESSION: 1. Acute minimally impacted subcapital right femoral neck fracture. Consider dedicated right hip x-rays for further evaluation. 2. No additional acute osseous abnormality.  No sacral ala fracture. These results will be called to the ordering clinician or representative by the Radiologist Assistant, and communication documented in the PACS or zVision Dashboard. Electronically Signed   By: Titus Dubin M.D.   On: 03/14/2019 15:40   Dg C-arm 1-60 Min  Result Date: 03/15/2019 CLINICAL DATA:  RIGHT hip pinning. EXAM: DG C-ARM 61-120 MIN COMPARISON:  None. FINDINGS: Intraoperative fluoroscopic images demonstrate placement of 3 fixation screws traversing the RIGHT femoral neck.  Fluoroscopy provided for 41 seconds. IMPRESSION: Fluoroscopy provided for 41 seconds. Electronically Signed   By: Franki Cabot M.D.   On: 03/15/2019 14:00   Dg Hip Operative Unilat W Or W/o Pelvis Right  Result Date: 03/15/2019 CLINICAL DATA:  Right hip pinning EXAM: OPERATIVE right HIP (WITH PELVIS IF PERFORMED) for VIEWS TECHNIQUE: Fluoroscopic spot image(s) were submitted for interpretation post-operatively. COMPARISON:  03/14/2019 FINDINGS: Images show placement of 3 Knowles type pins for fixation of a femoral neck fracture. No radiographically detectable complication. IMPRESSION: Knowles pin placement for fixation of a right femoral neck fracture. Electronically Signed   By: Nelson Chimes M.D.   On: 03/15/2019 15:34   Dg Hip Unilat With Pelvis 2-3 Views Right  Result Date: 03/14/2019 CLINICAL DATA:  Fall several days ago with known right hip fracture EXAM: DG HIP (WITH OR WITHOUT PELVIS) 3V RIGHT COMPARISON:  03/12/2019 FINDINGS: Impacted right femoral neck fracture is seen similar to that noted on prior MRI. Postsurgical changes in the lumbar spine are seen. No other fracture is noted. IMPRESSION: Mildly impacted right femoral neck fracture. No other focal abnormality is seen. Electronically Signed   By: Inez Catalina M.D.   On: 03/14/2019 18:13    Assessment/Plan  1. Adrenal insufficiency (HCC) - Na 129, currently on  NaCl 1 gm BID, will start Hydrocortisone 5 mg 1 tab daily in the afternoon and fludrocortisone 0.1 mg 1/2 tab = 0.05 mg daily  2. Uncontrolled hypertension - will increase hydralazine 25 mg from 3 times daily to 4 times daily, monitor BP, continue carvedilol  3. Chronic hyponatremia - Na 129, low, continue NaCl 1 g twice daily, BMP in 1 week  4. Constipation, unspecified constipation type - will start Senna-S 8.6-50 mg  2 tabs BID  5. Bilateral lower extremity edema - will start on bilateral ted-hose , knee high, on in am and off at hs    Family/ staff Communication:  Discussed plan of care with resident.  Labs/tests ordered: BMP in 1 week  Goals of care:   Short-term care   Durenda Age, DNP, FNP-BC [facility] [phone] (Monday-Friday 8:00 a.m. - 5:00 p.m.) [phone] (after hours)

## 2019-04-13 DIAGNOSIS — E2740 Unspecified adrenocortical insufficiency: Secondary | ICD-10-CM | POA: Insufficient documentation

## 2019-04-13 DIAGNOSIS — I10 Essential (primary) hypertension: Secondary | ICD-10-CM | POA: Insufficient documentation

## 2019-04-13 DIAGNOSIS — R600 Localized edema: Secondary | ICD-10-CM | POA: Insufficient documentation

## 2019-04-14 ENCOUNTER — Non-Acute Institutional Stay (SKILLED_NURSING_FACILITY): Payer: PPO | Admitting: Adult Health

## 2019-04-14 ENCOUNTER — Encounter: Payer: Self-pay | Admitting: Adult Health

## 2019-04-14 ENCOUNTER — Encounter: Payer: Self-pay | Admitting: Cardiology

## 2019-04-14 DIAGNOSIS — I10 Essential (primary) hypertension: Secondary | ICD-10-CM

## 2019-04-14 DIAGNOSIS — S72001S Fracture of unspecified part of neck of right femur, sequela: Secondary | ICD-10-CM

## 2019-04-14 DIAGNOSIS — K5901 Slow transit constipation: Secondary | ICD-10-CM

## 2019-04-14 DIAGNOSIS — E2740 Unspecified adrenocortical insufficiency: Secondary | ICD-10-CM

## 2019-04-14 DIAGNOSIS — M069 Rheumatoid arthritis, unspecified: Secondary | ICD-10-CM | POA: Diagnosis not present

## 2019-04-14 NOTE — Progress Notes (Addendum)
Location:  [place] [facility] Room Number: 212/A Place of Service:  SNF (31) Provider:  Durenda Age, DNP, FNP-BC  Patient Care Team: Maurice Small, MD as PCP - General (Family Medicine) Jerline Pain, MD as PCP - Cardiology (Cardiology)  Extended Emergency Contact Information Primary Emergency Contact: Arlee Muslim Address: [address], [place] [address] [place] Phone: [phone] Mobile Phone: [phone] Relation: Son Secondary Emergency Contact: Delmar Landau, [place] [address] [place] Phone: [phone] Relation: Son  Code Status:  Full Code  Goals of care: Advanced Directive information Advanced Directives 04/15/2019  Does Patient Have a Medical Advance Directive? Yes  Type of Advance Directive (No Data)  Does patient want to make changes to medical advance directive? No - Patient declined  Copy of [place] in Chart? -  Would patient like information on creating a medical advance directive? -  Pre-existing out of facility DNR order (yellow form or pink MOST form) -     Chief Complaint  Patient presents with  . Medical Management of Chronic Issues    Routine Visit    HPI:  Pt is a 73 y.o. female seen today for medical management of chronic diseases.  She has PMH of chronic hyponatremia, COPD, depression, GERD and hypertension.  She is currently here for a short-term rehabilitation after being hospitalized.  She was seen in her room today. She complained of constipation. Right femur surgical site is healed. She was recently started on  Hydrocortisone and Fludrocortisone for adrenal insufficiency.  Latest Na is 129.    Past Medical History:  Diagnosis Date  . AIN grade I   . Anxiety   . Bruises easily   . Chronic diarrhea   . Chronic hyponatremia   . Colitis   . COPD (chronic obstructive pulmonary disease) ([place])   . Depression   . GERD  (gastroesophageal reflux disease)   . History of chronic bronchitis   . History of vulvar dysplasia    serveral recurrency's   . Hypertension   . LBBB (left bundle branch block) W/ PROLONGED PR   CARDIOLOGSIT-  DR Johnsie Cancel-   LOV IN EPIC  . Macrocytosis   . Nocturia   . Osteoporosis   . Seronegative rheumatoid arthritis ([place])   . Short of breath on exertion   . Smokers' cough ([place])   . Venous insufficiency of leg    , Edema  . VIN III (vulvar intraepithelial neoplasia III)    RECURRENT -   Past Surgical History:  Procedure Laterality Date  . APPENDECTOMY  1992  . CARDIOVASCULAR STRESS TEST  06-23-2012  DR Jenkins [place]   NORMAL LEXISCAN MYOVIEW/ EF 83%/  NO ISCHEMIA  . CO2 LASER APPLICATION  02/25/9484   Procedure: CO2 LASER APPLICATION;  Surgeon: Janie Morning, MD PHD;  Location: [facility];  Service: Gynecology;  Laterality: N/A;  Laser Vaporization  . CO2 LASER APPLICATION N/A 12/05/2701   Procedure: CO2 LASER APPLICATION;  Surgeon: [name], MD;  Location: [facility];  Service: Gynecology;  Laterality: N/A;  . CO2 LASER APPLICATION N/A 5/00/9381   Procedure: CO2 LASER APPLICATION of the vulva;  Surgeon: Janie Morning, MD;  Location: [facility];  Service: Gynecology;  Laterality: N/A;  . EXCISION VULVAR LESIONS  07/2012  . HEMORRHOID SURGERY N/A 04/15/2013   Procedure:  Excision of external and internal  hemorrhoid with AIN, Exam under anesthesia;  Surgeon: Odis Hollingshead, MD;  Location: [facility];  Service: General;  Laterality: N/A;  . HIP PINNING,CANNULATED Right 03/15/2019   Procedure: Maebelle Munroe OF FEMORAL NECK FRACTURE;  Surgeon: Renette Butters, MD;  Location: [facility];  Service: Orthopedics;  Laterality: Right;  . REPAIR FISTULA IN ANO/  I & D PERIRECTAL ABSCESS  10-26-2002  DR Johnathan Hausen  . TRANSTHORACIC ECHOCARDIOGRAM  06-23-2012   NORMAL LVSF/ EF 55-60%  . VAGINAL HYSTERECTOMY  1997   partial  . VULVAR LESION REMOVAL N/A  11/03/2014   Procedure: CO2 LASER OF VULVAR ;  Surgeon: [name], MD;  Location: [facility];  Service: Gynecology;  Laterality: N/A;  . VULVECTOMY N/A 03/26/2015   Procedure: WIDE LOCA  EXCISION OF VULVAR;  Surgeon: [name], MD;  Location: [facility];  Service: Gynecology;  Laterality: N/A;  . Wide Local Excision of labia majora  04-04-2010   Right-sided lesion, CO2 ablation of right labia minora    Allergies  Allergen Reactions  . Latex Swelling and Other (See Comments)     fever blisters  . Augmentin [Amoxicillin-Pot Clavulanate] Nausea And Vomiting  . Doxycycline Other (See Comments)    Blurred vision, nervous, unsteady  . Lactose Intolerance (Gi) Other (See Comments)    Unknown, per pt  . Lialda [Mesalamine] Diarrhea  . Prinivil [Lisinopril] Diarrhea  . Triamcinolone Acetonide Other (See Comments)    REDNESS AND PAIN  . Amlodipine Rash  . Lotensin [Benazepril Hcl] Rash    Outpatient Encounter Medications as of 04/14/2019  Medication Sig  . alendronate (FOSAMAX) 70 MG tablet Take 70 mg by mouth every Monday.   Marland Kitchen atorvastatin (LIPITOR) 40 MG tablet Take 1 tablet by mouth once daily  . bisacodyl (DULCOLAX) 10 MG suppository If not relieved by MOM, give 10 mg Bisacodyl suppositiory rectally X 1 dose in 24 hours as needed (Do not use constipation standing orders for residents with renal failure/CFR less than 30. Contact MD for orders) (Physician Order)  . busPIRone (BUSPAR) 30 MG tablet Take 30 mg by mouth 2 (two) times daily.   . Calcium Carbonate-Vitamin D (CALTRATE 600+D PO) Take 1 tablet by mouth daily.   . carvedilol (COREG) 6.25 MG tablet Take 1 tablet (6.25 mg total) by mouth 2 (two) times daily with a meal.  . Cholecalciferol (VITAMIN D3) 1000 UNITS CAPS Take 1,000 Units by mouth daily.   Marland Kitchen diltiazem (TIAZAC) 360 MG 24 hr capsule Take 360 mg by mouth daily.   . ferrous sulfate 325 (65 FE) MG tablet Take 325 mg by mouth 2 (two) times daily  with a meal. For Anemia  . FLUDROCORTISONE ACETATE PO FLUDROCORTISONE 0.1 MG TABLET Give 1/2 tablet (0.05mg ) po qd DX: possible adernal Insufficiency  . hydrALAZINE (APRESOLINE) 25 MG tablet Take 25 mg by mouth 4 (four) times daily. For HTN  . HYDROcodone-acetaminophen (NORCO/VICODIN) 5-325 MG tablet Take 1 tablet by mouth every 8 (eight) hours as needed for up to 30 doses for moderate pain. For pain for 14 days  . HYDROcodone-acetaminophen (NORCO/VICODIN) 5-325 MG tablet Give 1 tab Q24H PRN for pain x 5 days then d/c.  . hydrocortisone (CORTEF) 5 MG tablet Take 5 mg by mouth every evening. DX: POSSIBLE ADRENAL INSUFFCIENCY  . hydroxychloroquine (PLAQUENIL) 200 MG tablet Take 200 mg by mouth daily.  . magnesium hydroxide (MILK OF MAGNESIA) 400 MG/5ML suspension If no BM in 3 days,  give 30 cc Milk of Magnesium p.o. x 1 dose in 24 hours as needed (Do not use standing constipation orders for residents with renal failure CFR less than 30. Contact MD for orders) (Physician Order)  . methocarbamol (ROBAXIN) 500 MG tablet Take 500 mg by mouth 2 (two) times daily as needed for muscle spasms. For spasms  . nicotine (NICODERM CQ - DOSED IN MG/24 HR) 7 mg/24hr patch APPLY 1 PATCH TOPICALLY ONCE DAILY FOR SMOKING CESSATION  . NON FORMULARY Apply foam dressing to sacrum 2 x week for protection  . Nutritional Supplement LIQD Take 120 mLs by mouth 2 (two) times a day. [employer]  . polyethylene glycol (MIRALAX / GLYCOLAX) 17 g packet Take 17 g by mouth daily as needed. For constipation  . predniSONE (DELTASONE) 5 MG tablet Take 5 mg by mouth daily with breakfast. --  LONG-TERM USE FOR RA  . sodium chloride 1 g tablet Take 1 tablet (1 g total) by mouth 2 (two) times daily with a meal.  . Sodium Phosphates (RA SALINE ENEMA RE) If not relieved by Biscodyl suppository, give disposable Saline Enema rectally X 1 dose/24 hrs as needed (Do not use constipation standing orders for residents with renal failure/CFR less than 30.  Contact MD for orders)(Physician Or  . [DISCONTINUED] enoxaparin (LOVENOX) 40 MG/0.4ML injection Inject 0.4 mLs (40 mg total) into the skin daily for 30 doses. For DVT prophylaxis  . [DISCONTINUED] hydrALAZINE (APRESOLINE) 25 MG tablet Take 3 tablets (75 mg total) by mouth 3 (three) times daily. (Patient taking differently: Take 25 mg by mouth 4 (four) times daily. )  . [DISCONTINUED] methocarbamol (ROBAXIN) 500 MG tablet Take 1 tablet (500 mg total) by mouth every 8 (eight) hours as needed for muscle spasms. (Patient taking differently: Take 500 mg by mouth 2 (two) times daily. )   No facility-administered encounter medications on file as of 04/14/2019.     Review of Systems  GENERAL: No change in appetite, no fatigue, no weight changes, no fever, chills or weakness MOUTH and THROAT: Denies oral discomfort, gingival pain or bleeding RESPIRATORY: no cough, SOB, DOE, wheezing, hemoptysis CARDIAC: No chest pain, edema or palpitations GI: +constipation GU: Denies dysuria, frequency, hematuria, incontinence, or discharge NEUROLOGICAL: Denies dizziness, syncope, numbness, or headache PSYCHIATRIC: Denies feelings of depression or anxiety. No report of hallucinations, insomnia, paranoia, or agitation   Immunization History  Administered Date(s) Administered  . Influenza, High Dose Seasonal PF 07/13/2017  . Tdap 09/11/2018   Pertinent  Health Maintenance Due  Topic Date Due  . COLONOSCOPY  04/21/2019 (Originally 02/23/1995)  . PNA vac Low Risk Adult (1 of 2 - PCV13) 04/21/2019 (Originally 02/22/2010)  . INFLUENZA VACCINE  05/13/2019 (Originally 04/02/2019)  . MAMMOGRAM  08/12/2020  . DEXA SCAN  Completed   No flowsheet data found.   Vitals:   04/14/19 0958  BP: 138/74  Pulse: 84  Resp: 20  Temp: 97.6 F (36.4 C)  TempSrc: Oral  SpO2: 99%  Weight: 96 lb 3.2 oz (43.6 kg)  Height: 5\' 4"  (1.626 m)   Body mass index is 16.51 kg/m.  Physical Exam  GENERAL APPEARANCE:  In no acute  distress.  SKIN:  Skin is warm and dry.  MOUTH and THROAT: Lips are without lesions. Oral mucosa is moist and without lesions. Tongue is normal in shape, size, and color and without lesions RESPIRATORY: Breathing is even & unlabored, BS CTAB CARDIAC: RRR, no murmur,no extra heart sounds, no edema GI: Abdomen soft, normal BS,  no masses, no tenderness EXTREMITIES: Able to move X 4 extremities NEUROLOGICAL: There is no tremor. Speech is clear. Alert and oriented X 3. PSYCHIATRIC:  Affect and behavior are appropriate   Labs reviewed: Recent Labs    02/21/19 0559 02/22/19 0630  03/13/19 0246  03/17/19 0332 03/18/19 0243 03/19/19 0255 03/23/19 03/29/19 04/12/19  NA 125* 126*   < > 118*   < > 126* 123* 127* 129* 128* 129*  K 4.4 3.8   < > 3.9   < > 4.6 4.0 3.9 3.7 3.7 4.3  CL 99 97*   < > 88*   < > 92* 90* 94*  --   --   --   CO2 21* 21*   < > 20*   < > 21* 24 25  --   --   --   GLUCOSE 111* 86   < > 87   < > 111* 96 108*  --   --   --   BUN 24* 24*   < > 14   < > 25* 20 17 13 15 18   CREATININE 1.15* 1.06*   < > 1.03*   < > 0.99 0.91 0.86 0.8 0.9 0.7  CALCIUM 8.3* 8.5*   < > 8.1*   < > 9.0 8.4* 8.7*  --   --   --   MG 1.8 1.8  --  2.2  --   --   --   --   --   --   --    < > = values in this interval not displayed.   No results for input(s): AST, ALT, ALKPHOS, BILITOT, PROT, ALBUMIN in the last 8760 hours. Recent Labs    02/14/19 2059  03/12/19 0403  03/18/19 0243 03/18/19 1806 03/19/19 0255 03/23/19 03/29/19  WBC 9.8   < > 11.9*   < > 6.4 7.5 7.7 5.1 5.8  NEUTROABS 8.5*  --  9.9*  --   --   --   --   --  3  HGB 8.6*   < > 10.8*   < > 7.0* 10.0* 9.7* 9.2* 9.9*  HCT 24.2*   < > 29.8*   < > 19.3* 28.1* 27.3* 25* 28*  MCV 101.3*   < > 100.7*   < > 101.0* 95.3 95.1  --   --   PLT 221   < > 225   < > 200 209 206 196 251   < > = values in this interval not displayed.   Lab Results  Component Value Date   TSH 0.944 02/19/2019   Lab Results  Component Value Date   HGBA1C 5.4  09/24/2017   Lab Results  Component Value Date   CHOL 143 09/24/2017   HDL 96 09/24/2017   LDLCALC 42 09/24/2017   TRIG 24 09/24/2017   CHOLHDL 1.5 09/24/2017     Assessment/Plan  1. Closed fracture of neck of right femur, sequela - S/P surgical repair, continue PT and OT, Lovenox for DVT prophylaxis, and Norco PRN for pain and Robaxin PRN  for muscle spasm  2. Adrenal insufficiency ([place]) -Recently started on fludrocortisone and hydrocortisone.  3. Rheumatoid arthritis, involving unspecified site, unspecified rheumatoid factor presence ([facility]) -Continue prednisone and hydrochloric with  4. Essential hypertension -Stable, continue carvedilol, hydralazine and diltiazem  5.  Constipation - will start senna S 8.6-50 mg 2 tabs twice daily   Family/ staff Communication:  Discussed plan of care with resident.  Labs/tests ordered:  None  Goals of care:  Short-term care   Durenda Age, DNP, FNP-BC [facility] [phone] (Monday-Friday 8:00 a.m. - 5:00 p.m.) [phone] (after hours)

## 2019-04-15 ENCOUNTER — Encounter: Payer: Self-pay | Admitting: Adult Health

## 2019-04-15 ENCOUNTER — Non-Acute Institutional Stay (SKILLED_NURSING_FACILITY): Payer: PPO | Admitting: Adult Health

## 2019-04-15 DIAGNOSIS — F419 Anxiety disorder, unspecified: Secondary | ICD-10-CM

## 2019-04-15 DIAGNOSIS — E871 Hypo-osmolality and hyponatremia: Secondary | ICD-10-CM | POA: Diagnosis not present

## 2019-04-15 DIAGNOSIS — M069 Rheumatoid arthritis, unspecified: Secondary | ICD-10-CM | POA: Diagnosis not present

## 2019-04-15 DIAGNOSIS — E785 Hyperlipidemia, unspecified: Secondary | ICD-10-CM | POA: Diagnosis not present

## 2019-04-15 DIAGNOSIS — S72001S Fracture of unspecified part of neck of right femur, sequela: Secondary | ICD-10-CM

## 2019-04-15 DIAGNOSIS — E2740 Unspecified adrenocortical insufficiency: Secondary | ICD-10-CM

## 2019-04-15 DIAGNOSIS — R350 Frequency of micturition: Secondary | ICD-10-CM | POA: Diagnosis not present

## 2019-04-15 DIAGNOSIS — D62 Acute posthemorrhagic anemia: Secondary | ICD-10-CM

## 2019-04-15 DIAGNOSIS — M810 Age-related osteoporosis without current pathological fracture: Secondary | ICD-10-CM

## 2019-04-15 DIAGNOSIS — I10 Essential (primary) hypertension: Secondary | ICD-10-CM

## 2019-04-15 DIAGNOSIS — K5901 Slow transit constipation: Secondary | ICD-10-CM

## 2019-04-15 MED ORDER — ATORVASTATIN CALCIUM 40 MG PO TABS: 40.0000 mg | ORAL_TABLET | Freq: Every day | ORAL | 0 refills | Status: DC

## 2019-04-15 MED ORDER — FLUDROCORTISONE ACETATE 0.1 MG PO TABS: 0.0500 mg | ORAL_TABLET | Freq: Every day | ORAL | 0 refills | Status: AC

## 2019-04-15 MED ORDER — HYDRALAZINE HCL 25 MG PO TABS: 25.0000 mg | ORAL_TABLET | Freq: Four times a day (QID) | ORAL | 0 refills | Status: DC

## 2019-04-15 MED ORDER — PREDNISONE 5 MG PO TABS: 5.0000 mg | ORAL_TABLET | Freq: Every day | ORAL | 0 refills | Status: AC

## 2019-04-15 MED ORDER — ACETAMINOPHEN 500 MG PO TABS: 1000.0000 mg | ORAL_TABLET | Freq: Three times a day (TID) | ORAL | 0 refills | Status: DC | PRN

## 2019-04-15 MED ORDER — HYDROCORTISONE 5 MG PO TABS: 5.0000 mg | ORAL_TABLET | Freq: Every evening | ORAL | 0 refills | Status: DC

## 2019-04-15 MED ORDER — SODIUM CHLORIDE 1 G PO TABS: 1.0000 g | ORAL_TABLET | Freq: Two times a day (BID) | ORAL | 0 refills | Status: AC

## 2019-04-15 MED ORDER — HYDROXYCHLOROQUINE SULFATE 200 MG PO TABS: 200.0000 mg | ORAL_TABLET | Freq: Every day | ORAL | 0 refills | Status: AC

## 2019-04-15 MED ORDER — ENOXAPARIN SODIUM 40 MG/0.4ML ~~LOC~~ SOLN: 40.0000 mg | SUBCUTANEOUS | 0 refills | Status: DC

## 2019-04-15 MED ORDER — POLYETHYLENE GLYCOL 3350 17 G PO PACK: 17.0000 g | PACK | Freq: Every day | ORAL | 0 refills | Status: AC | PRN

## 2019-04-15 MED ORDER — DILTIAZEM HCL ER BEADS 360 MG PO CP24: 360.0000 mg | ORAL_CAPSULE | Freq: Every day | ORAL | 0 refills | Status: DC

## 2019-04-15 MED ORDER — BUSPIRONE HCL 30 MG PO TABS: 30.0000 mg | ORAL_TABLET | Freq: Two times a day (BID) | ORAL | 0 refills | Status: AC

## 2019-04-15 MED ORDER — FERROUS SULFATE 325 (65 FE) MG PO TABS: 325.0000 mg | ORAL_TABLET | Freq: Every day | ORAL | 0 refills | Status: DC

## 2019-04-15 MED ORDER — CARVEDILOL 6.25 MG PO TABS: 6.2500 mg | ORAL_TABLET | Freq: Two times a day (BID) | ORAL | 0 refills | Status: DC

## 2019-04-15 MED ORDER — METHOCARBAMOL 500 MG PO TABS: 500.0000 mg | ORAL_TABLET | Freq: Two times a day (BID) | ORAL | 0 refills | Status: DC | PRN

## 2019-04-15 MED ORDER — VITAMIN D3 25 MCG (1000 UT) PO CAPS: 1000.0000 [IU] | ORAL_CAPSULE | Freq: Every day | ORAL | 0 refills | Status: AC

## 2019-04-15 MED ORDER — ALENDRONATE SODIUM 70 MG PO TABS: 70.0000 mg | ORAL_TABLET | ORAL | 0 refills | Status: AC

## 2019-04-15 MED ORDER — FLUDROCORTISONE ACETATE 0.1 MG PO TABS: 0.0500 mg | ORAL_TABLET | Freq: Every day | ORAL | 0 refills | Status: DC

## 2019-04-15 NOTE — Progress Notes (Signed)
Location:  [place] Room Number: 212/A Place of Service:  SNF (31) Provider:  Durenda Age, DNP, FNP-BC  Patient Care Team: Maurice Small, MD as PCP - General (Family Medicine) Jerline Pain, MD as PCP - Cardiology (Cardiology)  Extended Emergency Contact Information Primary Emergency Contact: Arlee Muslim Address: [address], [place] [address] [place] of [place] Phone: [phone] Mobile Phone: [phone] Relation: Son Secondary Emergency Contact: Delmar Landau, [place] [address] [place] Phone: [phone] Relation: Son  Code Status:  Full Code  Goals of care: Advanced Directive information Advanced Directives 04/15/2019  Does Patient Have a Medical Advance Directive? Yes  Type of Advance Directive (No Data)  Does patient want to make changes to medical advance directive? No - Patient declined  Copy of [place] in Chart? -  Would patient like information on creating a medical advance directive? -  Pre-existing out of facility DNR order (yellow form or pink MOST form) -     Chief Complaint  Patient presents with  . Discharge Note    Discharge Visit    HPI:  Pt is a 73 y.o. female seen today for discharge visit. She will have Home health PT, OT and Nurse for medication management.  She complained of frequent urination last night. When post-void urine residual was taken 800 ml was obtained. Will re-insert foley catheter and refer to urology.  She has been admitted to [facility] on 03/19/19 from a recent hospitalization due to a fall at home sustaining a right femur neck fracture S/P repair. She was recently started on fludrocortisone and hydrocortisone for adrenal insufficiency.  She has hyponatremia with latest sodium level of 129.  She is currently having sodium chloride supplementation.  She will need a repeat BMP when she  follows up with her PCP.   Past Medical History:  Diagnosis Date  . AIN grade I   . Anxiety   . Bruises easily   . Chronic diarrhea   . Chronic hyponatremia   . Colitis   . COPD (chronic obstructive pulmonary disease) ([place])   . Depression   . GERD (gastroesophageal reflux disease)   . History of chronic bronchitis   . History of vulvar dysplasia    serveral recurrency's   . Hypertension   . LBBB (left bundle branch block) W/ PROLONGED PR   CARDIOLOGSIT-  DR Johnsie Cancel-   LOV IN EPIC  . Macrocytosis   . Nocturia   . Osteoporosis   . Seronegative rheumatoid arthritis ([place])   . Short of breath on exertion   . Smokers' cough ([place])   . Venous insufficiency of leg    , Edema  . VIN III (vulvar intraepithelial neoplasia III)    RECURRENT -   Past Surgical History:  Procedure Laterality Date  . APPENDECTOMY  1992  . CARDIOVASCULAR STRESS TEST  06-23-2012  DR Jenkins [place]   NORMAL LEXISCAN MYOVIEW/ EF 83%/  NO ISCHEMIA  . CO2 LASER APPLICATION  73/53/2992   Procedure: CO2 LASER APPLICATION;  Surgeon: Janie Morning, MD PHD;  Location: [facility];  Service: Gynecology;  Laterality: N/A;  Laser Vaporization  . CO2 LASER APPLICATION N/A 12/01/6832   Procedure: CO2 LASER APPLICATION;  Surgeon: [name], MD;  Location: [facility];  Service: Gynecology;  Laterality: N/A;  . CO2 LASER  APPLICATION N/A 4/43/1540   Procedure: CO2 LASER APPLICATION of the vulva;  Surgeon: Janie Morning, MD;  Location: [facility];  Service: Gynecology;  Laterality: N/A;  . EXCISION VULVAR LESIONS  07/2012  . HEMORRHOID SURGERY N/A 04/15/2013   Procedure: Excision of external and internal  hemorrhoid with AIN, Exam under anesthesia;  Surgeon: Odis Hollingshead, MD;  Location: [facility];  Service: General;  Laterality: N/A;  . HIP PINNING,CANNULATED Right 03/15/2019   Procedure: Maebelle Munroe OF FEMORAL NECK FRACTURE;  Surgeon: Renette Butters, MD;  Location: [place];   Service: Orthopedics;  Laterality: Right;  . REPAIR FISTULA IN ANO/  I & D PERIRECTAL ABSCESS  10-26-2002  DR Johnathan Hausen  . TRANSTHORACIC ECHOCARDIOGRAM  06-23-2012   NORMAL LVSF/ EF 55-60%  . VAGINAL HYSTERECTOMY  1997   partial  . VULVAR LESION REMOVAL N/A 11/03/2014   Procedure: CO2 LASER OF VULVAR ;  Surgeon: [name], MD;  Location: [facility];  Service: Gynecology;  Laterality: N/A;  . VULVECTOMY N/A 03/26/2015   Procedure: WIDE LOCA  EXCISION OF VULVAR;  Surgeon: [name], MD;  Location: [place];  Service: Gynecology;  Laterality: N/A;  . Wide Local Excision of labia majora  04-04-2010   Right-sided lesion, CO2 ablation of right labia minora    Allergies  Allergen Reactions  . Latex Swelling and Other (See Comments)     fever blisters  . Augmentin [Amoxicillin-Pot Clavulanate] Nausea And Vomiting  . Doxycycline Other (See Comments)    Blurred vision, nervous, unsteady  . Lactose Intolerance (Gi) Other (See Comments)    Unknown, per pt  . Lialda [Mesalamine] Diarrhea  . Prinivil [Lisinopril] Diarrhea  . Triamcinolone Acetonide Other (See Comments)    REDNESS AND PAIN  . Amlodipine Rash  . Lotensin [Benazepril Hcl] Rash    Outpatient Encounter Medications as of 04/15/2019  Medication Sig  . alendronate (FOSAMAX) 70 MG tablet Take 70 mg by mouth every Monday.   Marland Kitchen atorvastatin (LIPITOR) 40 MG tablet Take 1 tablet by mouth once daily  . bisacodyl (DULCOLAX) 10 MG suppository If not relieved by MOM, give 10 mg Bisacodyl suppositiory rectally X 1 dose in 24 hours as needed (Do not use constipation standing orders for residents with renal failure/CFR less than 30. Contact MD for orders) (Physician Order)  . busPIRone (BUSPAR) 30 MG tablet Take 30 mg by mouth 2 (two) times daily.   . Calcium Carbonate-Vitamin D (CALTRATE 600+D PO) Take 1 tablet by mouth daily.   . carvedilol (COREG) 6.25 MG tablet Take 1 tablet (6.25 mg total) by mouth 2  (two) times daily with a meal.  . Cholecalciferol (VITAMIN D3) 1000 UNITS CAPS Take 1,000 Units by mouth daily.   Marland Kitchen diltiazem (TIAZAC) 360 MG 24 hr capsule Take 360 mg by mouth daily.   Marland Kitchen enoxaparin (LOVENOX) 40 MG/0.4ML injection INJECT 0.4ML (40MG ) SUBCUTANEOUSLY ONCE DAILY FOR 1 MONTH FOR DVT PROPHYLAXIS  . ferrous sulfate 325 (65 FE) MG tablet Take 325 mg by mouth 2 (two) times daily with a meal. For Anemia  . FLUDROCORTISONE ACETATE PO FLUDROCORTISONE 0.1 MG TABLET Give 1/2 tablet (0.05mg ) po qd DX: possible adernal Insufficiency  . hydrALAZINE (APRESOLINE) 25 MG tablet Take 25 mg by mouth 4 (four) times daily. For HTN  . HYDROcodone-acetaminophen (NORCO/VICODIN) 5-325 MG tablet Take 1 tablet by mouth every 8 (eight) hours as needed for up to 30 doses for moderate pain. For pain for 14 days  .  HYDROcodone-acetaminophen (NORCO/VICODIN) 5-325 MG tablet Give 1 tab Q24H PRN for pain x 5 days then d/c.  . hydrocortisone (CORTEF) 5 MG tablet Take 5 mg by mouth every evening. DX: POSSIBLE ADRENAL INSUFFCIENCY  . hydroxychloroquine (PLAQUENIL) 200 MG tablet Take 200 mg by mouth daily.  . magnesium hydroxide (MILK OF MAGNESIA) 400 MG/5ML suspension If no BM in 3 days, give 30 cc Milk of Magnesium p.o. x 1 dose in 24 hours as needed (Do not use standing constipation orders for residents with renal failure CFR less than 30. Contact MD for orders) (Physician Order)  . methocarbamol (ROBAXIN) 500 MG tablet Take 500 mg by mouth 2 (two) times daily as needed for muscle spasms. For spasms  . nicotine (NICODERM CQ - DOSED IN MG/24 HR) 7 mg/24hr patch APPLY 1 PATCH TOPICALLY ONCE DAILY FOR SMOKING CESSATION  . NON FORMULARY Apply foam dressing to sacrum 2 x week for protection  . Nutritional Supplement LIQD Take 120 mLs by mouth 2 (two) times a day. [employer]  . polyethylene glycol (MIRALAX / GLYCOLAX) 17 g packet Take 17 g by mouth daily as needed. For constipation  . predniSONE (DELTASONE) 5 MG tablet Take 5 mg  by mouth daily with breakfast. --  LONG-TERM USE FOR RA  . sodium chloride 1 g tablet Take 1 tablet (1 g total) by mouth 2 (two) times daily with a meal.  . Sodium Phosphates (RA SALINE ENEMA RE) If not relieved by Biscodyl suppository, give disposable Saline Enema rectally X 1 dose/24 hrs as needed (Do not use constipation standing orders for residents with renal failure/CFR less than 30. Contact MD for orders)(Physician Or  . [DISCONTINUED] enoxaparin (LOVENOX) 40 MG/0.4ML injection Inject 0.4 mLs (40 mg total) into the skin daily for 30 doses. For DVT prophylaxis   No facility-administered encounter medications on file as of 04/15/2019.     Review of Systems  GENERAL: No change in appetite, no fatigue, no weight changes, no fever, chills or weakness to follow MOUTH and THROAT: Denies oral discomfort, gingival pain or bleeding RESPIRATORY: no cough, SOB, DOE, wheezing, hemoptysis CARDIAC: No chest pain, edema or palpitations GI: No abdominal pain, diarrhea, constipation, heart burn, nausea or vomiting GU: complains of urinary frequency, denies hematuria NEUROLOGICAL: Denies dizziness, syncope, numbness, or headache PSYCHIATRIC: Denies feelings of depression or anxiety. No report of hallucinations, insomnia, paranoia, or agitation    Immunization History  Administered Date(s) Administered  . Influenza, High Dose Seasonal PF 07/13/2017  . Tdap 09/11/2018   Pertinent  Health Maintenance Due  Topic Date Due  . COLONOSCOPY  04/21/2019 (Originally 02/23/1995)  . PNA vac Low Risk Adult (1 of 2 - PCV13) 04/21/2019 (Originally 02/22/2010)  . INFLUENZA VACCINE  05/13/2019 (Originally 04/02/2019)  . MAMMOGRAM  08/12/2020  . DEXA SCAN  Completed   No flowsheet data found.   Vitals:   04/15/19 1117  BP: (!) 147/72  Pulse: 96  Resp: 19  Temp: 98.6 F (37 C)  TempSrc: Oral  SpO2: 99%  Weight: 96 lb 3.2 oz (43.6 kg)  Height: 5\' 4"  (1.626 m)   Body mass index is 16.51 kg/m.  Physical  Exam  GENERAL APPEARANCE:  In no acute distress.  SKIN:  Right femur surgical wound is healed MOUTH and THROAT: Lips are without lesions. Oral mucosa is moist and without lesions. Tongue is normal in shape, size, and color and without lesions RESPIRATORY: Breathing is even & unlabored, BS CTAB CARDIAC: RRR, + murmur,no extra heart sounds, no edema  GI: Abdomen soft, normal BS, no masses, no tenderness EXTREMITIES:  Able to move X 4 extremities NEUROLOGICAL: There is no tremor. Speech is clear. Alert and oriented X 3. PSYCHIATRIC:  Affect and behavior are appropriate  Labs reviewed: Recent Labs    02/21/19 0559 02/22/19 0630  03/13/19 0246  03/17/19 0332 03/18/19 0243 03/19/19 0255 03/23/19 03/29/19 04/12/19  NA 125* 126*   < > 118*   < > 126* 123* 127* 129* 128* 129*  K 4.4 3.8   < > 3.9   < > 4.6 4.0 3.9 3.7 3.7 4.3  CL 99 97*   < > 88*   < > 92* 90* 94*  --   --   --   CO2 21* 21*   < > 20*   < > 21* 24 25  --   --   --   GLUCOSE 111* 86   < > 87   < > 111* 96 108*  --   --   --   BUN 24* 24*   < > 14   < > 25* 20 17 13 15 18   CREATININE 1.15* 1.06*   < > 1.03*   < > 0.99 0.91 0.86 0.8 0.9 0.7  CALCIUM 8.3* 8.5*   < > 8.1*   < > 9.0 8.4* 8.7*  --   --   --   MG 1.8 1.8  --  2.2  --   --   --   --   --   --   --    < > = values in this interval not displayed.   No results for input(s): AST, ALT, ALKPHOS, BILITOT, PROT, ALBUMIN in the last 8760 hours. Recent Labs    02/14/19 2059  03/12/19 0403  03/18/19 0243 03/18/19 1806 03/19/19 0255 03/23/19 03/29/19  WBC 9.8   < > 11.9*   < > 6.4 7.5 7.7 5.1 5.8  NEUTROABS 8.5*  --  9.9*  --   --   --   --   --  3  HGB 8.6*   < > 10.8*   < > 7.0* 10.0* 9.7* 9.2* 9.9*  HCT 24.2*   < > 29.8*   < > 19.3* 28.1* 27.3* 25* 28*  MCV 101.3*   < > 100.7*   < > 101.0* 95.3 95.1  --   --   PLT 221   < > 225   < > 200 209 206 196 251   < > = values in this interval not displayed.   Lab Results  Component Value Date   TSH 0.944 02/19/2019    Lab Results  Component Value Date   HGBA1C 5.4 09/24/2017   Lab Results  Component Value Date   CHOL 143 09/24/2017   HDL 96 09/24/2017   LDLCALC 42 09/24/2017   TRIG 24 09/24/2017   CHOLHDL 1.5 09/24/2017     Assessment/Plan  1. Closed fracture of neck of right femur, sequela - follow up with orthopedics, Home health PT and OT for therapeutic strengthening exercises, fall precautions - methocarbamol (ROBAXIN) 500 MG tablet; Take 1 tablet (500 mg total) by mouth 2 (two) times daily as needed for up to 20 doses for muscle spasms. For spasms  Dispense: 20 tablet; Refill: 0 - enoxaparin (LOVENOX) 40 MG/0.4ML injection; Inject 0.4 mLs (40 mg total) into the skin daily for 1 day. INJECT 0.4ML (40MG ) SUBCUTANEOUSLY ONCE DAILY FOR 1 MONTH FOR DVT PROPHYLAXIS  Dispense: 0.4 mL; Refill:  0 - acetaminophen (TYLENOL) 500 MG tablet; Take 2 tablets (1,000 mg total) by mouth every 8 (eight) hours as needed.  Dispense: 30 tablet; Refill: 0  2. Rheumatoid arthritis, involving unspecified site, unspecified rheumatoid factor presence (HCC) - predniSONE (DELTASONE) 5 MG tablet; Take 1 tablet (5 mg total) by mouth daily with breakfast for 30 doses. --  LONG-TERM USE FOR RA  Dispense: 30 tablet; Refill: 0 - hydroxychloroquine (PLAQUENIL) 200 MG tablet; Take 1 tablet (200 mg total) by mouth daily.  Dispense: 30 tablet; Refill: 0  3. Urinary frequency - she complained of urinary frequency at night, will checked post-void residual and obtained 800 ml urine, will re-insert foley catheter and follow-up with urology, will send urine sample for urinalysis with culture and sensitivity  4. Adrenal insufficiency (HCC) - fludrocortisone (FLORINEF) 0.1 MG tablet; Take 0.5 tablets (0.05 mg total) by mouth daily for 30 doses. FLUDROCORTISONE 0.1 MG TABLET Give 1/2 tablet (0.05mg ) po qd DX: possible adernal Insufficiency  Dispense: 15 tablet; Refill: 0 - hydrocortisone (CORTEF) 5 MG tablet; Take 1 tablet (5 mg total) by  mouth every evening. DX: POSSIBLE ADRENAL INSUFFCIENCY  Dispense: 30 tablet; Refill: 0  5. Essential hypertension - carvedilol (COREG) 6.25 MG tablet; Take 1 tablet (6.25 mg total) by mouth 2 (two) times daily with a meal for 60 doses.  Dispense: 60 tablet; Refill: 0 - diltiazem (TIAZAC) 360 MG 24 hr capsule; Take 1 capsule (360 mg total) by mouth daily for 30 doses.  Dispense: 30 capsule; Refill: 0 - hydrALAZINE (APRESOLINE) 25 MG tablet; Take 1 tablet (25 mg total) by mouth 4 (four) times daily for 120 doses. For HTN  Dispense: 120 tablet; Refill: 0  6. Osteoporosis, unspecified osteoporosis type, unspecified pathological fracture presence - alendronate (FOSAMAX) 70 MG tablet; Take 1 tablet (70 mg total) by mouth every Monday for 4 doses.  Dispense: 4 tablet; Refill: 0 - Cholecalciferol (VITAMIN D3) 25 MCG (1000 UT) CAPS; Take 1 capsule (1,000 Units total) by mouth daily for 30 doses.  Dispense: 30 capsule; Refill: 0  7. Anxiety - busPIRone (BUSPAR) 30 MG tablet; Take 1 tablet (30 mg total) by mouth 2 (two) times daily for 60 doses.  Dispense: 60 tablet; Refill: 0  8. Acute blood loss anemia - ferrous sulfate 325 (65 FE) MG tablet; Take 1 tablet (325 mg total) by mouth daily for 30 doses. For Anemia  Dispense: 30 tablet; Refill: 0  9. Hyperlipidemia, unspecified hyperlipidemia type - atorvastatin (LIPITOR) 40 MG tablet; Take 1 tablet (40 mg total) by mouth daily.  Dispense: 30 tablet; Refill: 0  10. Hyponatremia - will need to re-check BMP in 1-2 weeks - sodium chloride 1 g tablet; Take 1 tablet (1 g total) by mouth 2 (two) times daily with a meal for 60 doses.  Dispense: 60 tablet; Refill: 0  11. Slow transit constipation - polyethylene glycol (MIRALAX / GLYCOLAX) 17 g packet; Take 17 g by mouth daily as needed. For constipation  Dispense: 14 each; Refill: 0    I have filled out patient's discharge paperwork and e-prescribed medications.  Patient will receive home health PT, OT and  Nursing for medication management..  DME provided: wheelchair has unlimited length of need She is requiring wheelchair due to a recent fracture of right neck femur S/P surgical repair. Patient has mobility limitation that prevents her from completing ADLs.  She is unable to safely ambulate with an assistive device such as rolling walker, cane or crutch.  She can safely self  propel.   Total discharge time: Greater than 30 minutes  Discharge time involved coordination of the discharge process with social worker, nursing staff and therapy department. Medical justification for home health services/DME verified.   Durenda Age, DNP, FNP-BC [facility] [phone] (Monday-Friday 8:00 a.m. - 5:00 p.m.) [phone] (after hours)

## 2019-04-18 DIAGNOSIS — M810 Age-related osteoporosis without current pathological fracture: Secondary | ICD-10-CM | POA: Diagnosis not present

## 2019-04-18 DIAGNOSIS — N390 Urinary tract infection, site not specified: Secondary | ICD-10-CM | POA: Diagnosis not present

## 2019-04-18 DIAGNOSIS — M0600 Rheumatoid arthritis without rheumatoid factor, unspecified site: Secondary | ICD-10-CM | POA: Diagnosis not present

## 2019-04-18 DIAGNOSIS — F419 Anxiety disorder, unspecified: Secondary | ICD-10-CM | POA: Diagnosis not present

## 2019-04-18 DIAGNOSIS — M96840 Postprocedural hematoma of a musculoskeletal structure following a musculoskeletal system procedure: Secondary | ICD-10-CM | POA: Diagnosis not present

## 2019-04-18 DIAGNOSIS — M4807 Spinal stenosis, lumbosacral region: Secondary | ICD-10-CM | POA: Diagnosis not present

## 2019-04-18 DIAGNOSIS — I5032 Chronic diastolic (congestive) heart failure: Secondary | ICD-10-CM | POA: Diagnosis not present

## 2019-04-18 DIAGNOSIS — M19042 Primary osteoarthritis, left hand: Secondary | ICD-10-CM | POA: Diagnosis not present

## 2019-04-18 DIAGNOSIS — D7589 Other specified diseases of blood and blood-forming organs: Secondary | ICD-10-CM | POA: Diagnosis not present

## 2019-04-18 DIAGNOSIS — F329 Major depressive disorder, single episode, unspecified: Secondary | ICD-10-CM | POA: Diagnosis not present

## 2019-04-18 DIAGNOSIS — M8938 Hypertrophy of bone, other site: Secondary | ICD-10-CM | POA: Diagnosis not present

## 2019-04-18 DIAGNOSIS — R339 Retention of urine, unspecified: Secondary | ICD-10-CM | POA: Diagnosis not present

## 2019-04-18 DIAGNOSIS — M19039 Primary osteoarthritis, unspecified wrist: Secondary | ICD-10-CM | POA: Diagnosis not present

## 2019-04-18 DIAGNOSIS — M19041 Primary osteoarthritis, right hand: Secondary | ICD-10-CM | POA: Diagnosis not present

## 2019-04-18 DIAGNOSIS — E871 Hypo-osmolality and hyponatremia: Secondary | ICD-10-CM | POA: Diagnosis not present

## 2019-04-18 DIAGNOSIS — I872 Venous insufficiency (chronic) (peripheral): Secondary | ICD-10-CM | POA: Diagnosis not present

## 2019-04-18 DIAGNOSIS — Z466 Encounter for fitting and adjustment of urinary device: Secondary | ICD-10-CM | POA: Diagnosis not present

## 2019-04-18 DIAGNOSIS — J441 Chronic obstructive pulmonary disease with (acute) exacerbation: Secondary | ICD-10-CM | POA: Diagnosis not present

## 2019-04-18 DIAGNOSIS — R319 Hematuria, unspecified: Secondary | ICD-10-CM | POA: Diagnosis not present

## 2019-04-18 DIAGNOSIS — J449 Chronic obstructive pulmonary disease, unspecified: Secondary | ICD-10-CM | POA: Diagnosis not present

## 2019-04-18 DIAGNOSIS — D649 Anemia, unspecified: Secondary | ICD-10-CM | POA: Diagnosis not present

## 2019-04-18 DIAGNOSIS — L89151 Pressure ulcer of sacral region, stage 1: Secondary | ICD-10-CM | POA: Diagnosis not present

## 2019-04-18 DIAGNOSIS — K219 Gastro-esophageal reflux disease without esophagitis: Secondary | ICD-10-CM | POA: Diagnosis not present

## 2019-04-18 DIAGNOSIS — I700 Atherosclerosis of aorta: Secondary | ICD-10-CM | POA: Diagnosis not present

## 2019-04-18 DIAGNOSIS — I447 Left bundle-branch block, unspecified: Secondary | ICD-10-CM | POA: Diagnosis not present

## 2019-04-18 DIAGNOSIS — I10 Essential (primary) hypertension: Secondary | ICD-10-CM | POA: Diagnosis not present

## 2019-04-18 DIAGNOSIS — M5117 Intervertebral disc disorders with radiculopathy, lumbosacral region: Secondary | ICD-10-CM | POA: Diagnosis not present

## 2019-04-18 DIAGNOSIS — G834 Cauda equina syndrome: Secondary | ICD-10-CM | POA: Diagnosis not present

## 2019-04-18 DIAGNOSIS — K5900 Constipation, unspecified: Secondary | ICD-10-CM | POA: Diagnosis not present

## 2019-04-22 DIAGNOSIS — R338 Other retention of urine: Secondary | ICD-10-CM | POA: Diagnosis not present

## 2019-04-25 DIAGNOSIS — R338 Other retention of urine: Secondary | ICD-10-CM | POA: Diagnosis not present

## 2019-04-26 DIAGNOSIS — F329 Major depressive disorder, single episode, unspecified: Secondary | ICD-10-CM | POA: Diagnosis not present

## 2019-04-26 DIAGNOSIS — Z466 Encounter for fitting and adjustment of urinary device: Secondary | ICD-10-CM | POA: Diagnosis not present

## 2019-04-26 DIAGNOSIS — M80051D Age-related osteoporosis with current pathological fracture, right femur, subsequent encounter for fracture with routine healing: Secondary | ICD-10-CM | POA: Diagnosis not present

## 2019-04-26 DIAGNOSIS — K219 Gastro-esophageal reflux disease without esophagitis: Secondary | ICD-10-CM | POA: Diagnosis not present

## 2019-04-26 DIAGNOSIS — M19032 Primary osteoarthritis, left wrist: Secondary | ICD-10-CM | POA: Diagnosis not present

## 2019-04-26 DIAGNOSIS — G834 Cauda equina syndrome: Secondary | ICD-10-CM | POA: Diagnosis not present

## 2019-04-26 DIAGNOSIS — M19042 Primary osteoarthritis, left hand: Secondary | ICD-10-CM | POA: Diagnosis not present

## 2019-04-26 DIAGNOSIS — D649 Anemia, unspecified: Secondary | ICD-10-CM | POA: Diagnosis not present

## 2019-04-26 DIAGNOSIS — E871 Hypo-osmolality and hyponatremia: Secondary | ICD-10-CM | POA: Diagnosis not present

## 2019-04-26 DIAGNOSIS — Z23 Encounter for immunization: Secondary | ICD-10-CM | POA: Diagnosis not present

## 2019-04-26 DIAGNOSIS — M5117 Intervertebral disc disorders with radiculopathy, lumbosacral region: Secondary | ICD-10-CM | POA: Diagnosis not present

## 2019-04-26 DIAGNOSIS — I447 Left bundle-branch block, unspecified: Secondary | ICD-10-CM | POA: Diagnosis not present

## 2019-04-26 DIAGNOSIS — S50311D Abrasion of right elbow, subsequent encounter: Secondary | ICD-10-CM | POA: Diagnosis not present

## 2019-04-26 DIAGNOSIS — R339 Retention of urine, unspecified: Secondary | ICD-10-CM | POA: Diagnosis not present

## 2019-04-26 DIAGNOSIS — S50312D Abrasion of left elbow, subsequent encounter: Secondary | ICD-10-CM | POA: Diagnosis not present

## 2019-04-26 DIAGNOSIS — M96840 Postprocedural hematoma of a musculoskeletal structure following a musculoskeletal system procedure: Secondary | ICD-10-CM | POA: Diagnosis not present

## 2019-04-26 DIAGNOSIS — M8938 Hypertrophy of bone, other site: Secondary | ICD-10-CM | POA: Diagnosis not present

## 2019-04-26 DIAGNOSIS — M0600 Rheumatoid arthritis without rheumatoid factor, unspecified site: Secondary | ICD-10-CM | POA: Diagnosis not present

## 2019-04-26 DIAGNOSIS — S80811D Abrasion, right lower leg, subsequent encounter: Secondary | ICD-10-CM | POA: Diagnosis not present

## 2019-04-26 DIAGNOSIS — M19041 Primary osteoarthritis, right hand: Secondary | ICD-10-CM | POA: Diagnosis not present

## 2019-04-26 DIAGNOSIS — I110 Hypertensive heart disease with heart failure: Secondary | ICD-10-CM | POA: Diagnosis not present

## 2019-04-26 DIAGNOSIS — F419 Anxiety disorder, unspecified: Secondary | ICD-10-CM | POA: Diagnosis not present

## 2019-04-26 DIAGNOSIS — D692 Other nonthrombocytopenic purpura: Secondary | ICD-10-CM | POA: Diagnosis not present

## 2019-04-26 DIAGNOSIS — I10 Essential (primary) hypertension: Secondary | ICD-10-CM | POA: Diagnosis not present

## 2019-04-26 DIAGNOSIS — K5900 Constipation, unspecified: Secondary | ICD-10-CM | POA: Diagnosis not present

## 2019-04-26 DIAGNOSIS — I5032 Chronic diastolic (congestive) heart failure: Secondary | ICD-10-CM | POA: Diagnosis not present

## 2019-04-26 DIAGNOSIS — M19031 Primary osteoarthritis, right wrist: Secondary | ICD-10-CM | POA: Diagnosis not present

## 2019-04-26 DIAGNOSIS — M4807 Spinal stenosis, lumbosacral region: Secondary | ICD-10-CM | POA: Diagnosis not present

## 2019-04-26 DIAGNOSIS — R54 Age-related physical debility: Secondary | ICD-10-CM | POA: Diagnosis not present

## 2019-04-26 DIAGNOSIS — J432 Centrilobular emphysema: Secondary | ICD-10-CM | POA: Diagnosis not present

## 2019-04-26 DIAGNOSIS — M810 Age-related osteoporosis without current pathological fracture: Secondary | ICD-10-CM | POA: Diagnosis not present

## 2019-04-26 LAB — CBC: RBC: 3.06 — AB (ref 3.87–5.11)

## 2019-04-26 LAB — BASIC METABOLIC PANEL
CO2: 25 — AB (ref 13–22)
Chloride: 85 — AB (ref 99–108)
Creatinine: 1 (ref 0.5–1.1)
Glucose: 118
Potassium: 3.5 (ref 3.4–5.3)
Sodium: 121 — AB (ref 137–147)

## 2019-04-26 LAB — CBC AND DIFFERENTIAL
HCT: 30 — AB (ref 36–46)
Hemoglobin: 11 — AB (ref 12.0–16.0)
Platelets: 289 (ref 150–399)
WBC: 6.7

## 2019-04-26 LAB — VITAMIN D 25 HYDROXY (VIT D DEFICIENCY, FRACTURES): Vit D, 25-Hydroxy: 51.52

## 2019-04-26 LAB — COMPREHENSIVE METABOLIC PANEL: Calcium: 8.6 — AB (ref 8.7–10.7)

## 2019-04-27 DIAGNOSIS — L89151 Pressure ulcer of sacral region, stage 1: Secondary | ICD-10-CM | POA: Diagnosis not present

## 2019-04-27 DIAGNOSIS — S72001D Fracture of unspecified part of neck of right femur, subsequent encounter for closed fracture with routine healing: Secondary | ICD-10-CM | POA: Diagnosis not present

## 2019-04-27 DIAGNOSIS — M5126 Other intervertebral disc displacement, lumbar region: Secondary | ICD-10-CM | POA: Diagnosis not present

## 2019-05-01 ENCOUNTER — Other Ambulatory Visit: Payer: Self-pay | Admitting: Adult Health

## 2019-05-01 DIAGNOSIS — E2740 Unspecified adrenocortical insufficiency: Secondary | ICD-10-CM

## 2019-05-01 DIAGNOSIS — D62 Acute posthemorrhagic anemia: Secondary | ICD-10-CM

## 2019-05-03 DIAGNOSIS — M0600 Rheumatoid arthritis without rheumatoid factor, unspecified site: Secondary | ICD-10-CM | POA: Diagnosis not present

## 2019-05-03 DIAGNOSIS — J432 Centrilobular emphysema: Secondary | ICD-10-CM | POA: Diagnosis not present

## 2019-05-03 DIAGNOSIS — I110 Hypertensive heart disease with heart failure: Secondary | ICD-10-CM | POA: Diagnosis not present

## 2019-05-03 DIAGNOSIS — F419 Anxiety disorder, unspecified: Secondary | ICD-10-CM | POA: Diagnosis not present

## 2019-05-03 DIAGNOSIS — S50311D Abrasion of right elbow, subsequent encounter: Secondary | ICD-10-CM | POA: Diagnosis not present

## 2019-05-03 DIAGNOSIS — F329 Major depressive disorder, single episode, unspecified: Secondary | ICD-10-CM | POA: Diagnosis not present

## 2019-05-03 DIAGNOSIS — Z466 Encounter for fitting and adjustment of urinary device: Secondary | ICD-10-CM | POA: Diagnosis not present

## 2019-05-03 DIAGNOSIS — M4807 Spinal stenosis, lumbosacral region: Secondary | ICD-10-CM | POA: Diagnosis not present

## 2019-05-03 DIAGNOSIS — R339 Retention of urine, unspecified: Secondary | ICD-10-CM | POA: Diagnosis not present

## 2019-05-03 DIAGNOSIS — M8938 Hypertrophy of bone, other site: Secondary | ICD-10-CM | POA: Diagnosis not present

## 2019-05-03 DIAGNOSIS — K5900 Constipation, unspecified: Secondary | ICD-10-CM | POA: Diagnosis not present

## 2019-05-03 DIAGNOSIS — M19032 Primary osteoarthritis, left wrist: Secondary | ICD-10-CM | POA: Diagnosis not present

## 2019-05-03 DIAGNOSIS — M19031 Primary osteoarthritis, right wrist: Secondary | ICD-10-CM | POA: Diagnosis not present

## 2019-05-03 DIAGNOSIS — G834 Cauda equina syndrome: Secondary | ICD-10-CM | POA: Diagnosis not present

## 2019-05-03 DIAGNOSIS — M5117 Intervertebral disc disorders with radiculopathy, lumbosacral region: Secondary | ICD-10-CM | POA: Diagnosis not present

## 2019-05-03 DIAGNOSIS — I5032 Chronic diastolic (congestive) heart failure: Secondary | ICD-10-CM | POA: Diagnosis not present

## 2019-05-03 DIAGNOSIS — M80051D Age-related osteoporosis with current pathological fracture, right femur, subsequent encounter for fracture with routine healing: Secondary | ICD-10-CM | POA: Diagnosis not present

## 2019-05-03 DIAGNOSIS — M19041 Primary osteoarthritis, right hand: Secondary | ICD-10-CM | POA: Diagnosis not present

## 2019-05-03 DIAGNOSIS — K219 Gastro-esophageal reflux disease without esophagitis: Secondary | ICD-10-CM | POA: Diagnosis not present

## 2019-05-03 DIAGNOSIS — S80811D Abrasion, right lower leg, subsequent encounter: Secondary | ICD-10-CM | POA: Diagnosis not present

## 2019-05-03 DIAGNOSIS — I447 Left bundle-branch block, unspecified: Secondary | ICD-10-CM | POA: Diagnosis not present

## 2019-05-03 DIAGNOSIS — M96840 Postprocedural hematoma of a musculoskeletal structure following a musculoskeletal system procedure: Secondary | ICD-10-CM | POA: Diagnosis not present

## 2019-05-03 DIAGNOSIS — M19042 Primary osteoarthritis, left hand: Secondary | ICD-10-CM | POA: Diagnosis not present

## 2019-05-03 DIAGNOSIS — E871 Hypo-osmolality and hyponatremia: Secondary | ICD-10-CM | POA: Diagnosis not present

## 2019-05-03 DIAGNOSIS — S50312D Abrasion of left elbow, subsequent encounter: Secondary | ICD-10-CM | POA: Diagnosis not present

## 2019-05-04 ENCOUNTER — Other Ambulatory Visit: Payer: Self-pay | Admitting: Neurological Surgery

## 2019-05-04 DIAGNOSIS — M5416 Radiculopathy, lumbar region: Secondary | ICD-10-CM

## 2019-05-12 ENCOUNTER — Ambulatory Visit
Admission: RE | Admit: 2019-05-12 | Discharge: 2019-05-12 | Disposition: A | Discharge status: Home / Self Care | Payer: PPO | Source: Ambulatory Visit | Attending: Neurological Surgery | Admitting: Neurological Surgery

## 2019-05-12 DIAGNOSIS — M5416 Radiculopathy, lumbar region: Secondary | ICD-10-CM

## 2019-05-12 DIAGNOSIS — S3219XA Other fracture of sacrum, initial encounter for closed fracture: Secondary | ICD-10-CM | POA: Diagnosis not present

## 2019-05-16 DIAGNOSIS — R338 Other retention of urine: Secondary | ICD-10-CM | POA: Diagnosis not present

## 2019-05-18 DIAGNOSIS — M8448XA Pathological fracture, other site, initial encounter for fracture: Secondary | ICD-10-CM | POA: Diagnosis not present

## 2019-05-27 DIAGNOSIS — R338 Other retention of urine: Secondary | ICD-10-CM | POA: Diagnosis not present

## 2019-06-03 DIAGNOSIS — R338 Other retention of urine: Secondary | ICD-10-CM | POA: Diagnosis not present

## 2019-06-27 ENCOUNTER — Other Ambulatory Visit: Payer: Self-pay | Admitting: Adult Health

## 2019-06-27 DIAGNOSIS — M069 Rheumatoid arthritis, unspecified: Secondary | ICD-10-CM | POA: Diagnosis not present

## 2019-06-27 DIAGNOSIS — J441 Chronic obstructive pulmonary disease with (acute) exacerbation: Secondary | ICD-10-CM | POA: Diagnosis not present

## 2019-06-27 DIAGNOSIS — J449 Chronic obstructive pulmonary disease, unspecified: Secondary | ICD-10-CM | POA: Diagnosis not present

## 2019-06-27 DIAGNOSIS — I10 Essential (primary) hypertension: Secondary | ICD-10-CM | POA: Diagnosis not present

## 2019-06-27 DIAGNOSIS — F17200 Nicotine dependence, unspecified, uncomplicated: Secondary | ICD-10-CM | POA: Diagnosis not present

## 2019-06-27 DIAGNOSIS — M810 Age-related osteoporosis without current pathological fracture: Secondary | ICD-10-CM | POA: Diagnosis not present

## 2019-06-27 DIAGNOSIS — F331 Major depressive disorder, recurrent, moderate: Secondary | ICD-10-CM | POA: Diagnosis not present

## 2019-06-28 ENCOUNTER — Other Ambulatory Visit: Payer: Self-pay | Admitting: Adult Health

## 2019-06-28 DIAGNOSIS — I10 Essential (primary) hypertension: Secondary | ICD-10-CM

## 2019-07-07 DIAGNOSIS — R338 Other retention of urine: Secondary | ICD-10-CM | POA: Diagnosis not present

## 2019-07-12 DIAGNOSIS — R338 Other retention of urine: Secondary | ICD-10-CM | POA: Diagnosis not present

## 2019-07-13 DIAGNOSIS — G834 Cauda equina syndrome: Secondary | ICD-10-CM | POA: Diagnosis not present

## 2019-07-13 DIAGNOSIS — M8448XA Pathological fracture, other site, initial encounter for fracture: Secondary | ICD-10-CM | POA: Diagnosis not present

## 2019-07-20 DIAGNOSIS — R338 Other retention of urine: Secondary | ICD-10-CM | POA: Diagnosis not present

## 2019-07-24 ENCOUNTER — Other Ambulatory Visit: Payer: Self-pay | Admitting: Adult Health

## 2019-07-24 DIAGNOSIS — E785 Hyperlipidemia, unspecified: Secondary | ICD-10-CM

## 2019-07-31 ENCOUNTER — Other Ambulatory Visit: Payer: Self-pay | Admitting: Adult Health

## 2019-07-31 DIAGNOSIS — E785 Hyperlipidemia, unspecified: Secondary | ICD-10-CM

## 2019-08-01 ENCOUNTER — Other Ambulatory Visit: Payer: Self-pay | Admitting: Family Medicine

## 2019-08-01 DIAGNOSIS — Z1231 Encounter for screening mammogram for malignant neoplasm of breast: Secondary | ICD-10-CM

## 2019-08-03 ENCOUNTER — Other Ambulatory Visit: Payer: Self-pay | Admitting: Adult Health

## 2019-08-03 DIAGNOSIS — M069 Rheumatoid arthritis, unspecified: Secondary | ICD-10-CM

## 2019-08-05 DIAGNOSIS — N3000 Acute cystitis without hematuria: Secondary | ICD-10-CM | POA: Diagnosis not present

## 2019-08-05 DIAGNOSIS — R338 Other retention of urine: Secondary | ICD-10-CM | POA: Diagnosis not present

## 2019-08-09 DIAGNOSIS — Z681 Body mass index (BMI) 19 or less, adult: Secondary | ICD-10-CM | POA: Diagnosis not present

## 2019-08-09 DIAGNOSIS — L89152 Pressure ulcer of sacral region, stage 2: Secondary | ICD-10-CM | POA: Diagnosis not present

## 2019-08-09 DIAGNOSIS — K5289 Other specified noninfective gastroenteritis and colitis: Secondary | ICD-10-CM | POA: Diagnosis not present

## 2019-08-09 DIAGNOSIS — M8000XD Age-related osteoporosis with current pathological fracture, unspecified site, subsequent encounter for fracture with routine healing: Secondary | ICD-10-CM | POA: Diagnosis not present

## 2019-08-09 DIAGNOSIS — M2550 Pain in unspecified joint: Secondary | ICD-10-CM | POA: Diagnosis not present

## 2019-08-09 DIAGNOSIS — M0609 Rheumatoid arthritis without rheumatoid factor, multiple sites: Secondary | ICD-10-CM | POA: Diagnosis not present

## 2019-08-09 DIAGNOSIS — Z79899 Other long term (current) drug therapy: Secondary | ICD-10-CM | POA: Diagnosis not present

## 2019-08-10 ENCOUNTER — Other Ambulatory Visit: Payer: Self-pay | Admitting: Rheumatology

## 2019-08-10 DIAGNOSIS — M8000XD Age-related osteoporosis with current pathological fracture, unspecified site, subsequent encounter for fracture with routine healing: Secondary | ICD-10-CM

## 2019-08-16 ENCOUNTER — Ambulatory Visit
Admission: RE | Admit: 2019-08-16 | Discharge: 2019-08-16 | Disposition: A | Discharge status: Home / Self Care | Payer: PPO | Source: Ambulatory Visit | Attending: Rheumatology | Admitting: Rheumatology

## 2019-08-16 ENCOUNTER — Other Ambulatory Visit: Payer: Self-pay

## 2019-08-16 DIAGNOSIS — M85832 Other specified disorders of bone density and structure, left forearm: Secondary | ICD-10-CM | POA: Diagnosis not present

## 2019-08-16 DIAGNOSIS — M8000XD Age-related osteoporosis with current pathological fracture, unspecified site, subsequent encounter for fracture with routine healing: Secondary | ICD-10-CM

## 2019-08-16 DIAGNOSIS — M810 Age-related osteoporosis without current pathological fracture: Secondary | ICD-10-CM | POA: Diagnosis not present

## 2019-08-17 ENCOUNTER — Encounter (HOSPITAL_COMMUNITY): Payer: Self-pay | Admitting: Emergency Medicine

## 2019-08-17 ENCOUNTER — Inpatient Hospital Stay (HOSPITAL_COMMUNITY)
Admission: EM | Admit: 2019-08-17 | Discharge: 2019-08-23 | DRG: 393 | Disposition: A | Discharge status: Home Health | Payer: PPO | Attending: Internal Medicine | Admitting: Internal Medicine

## 2019-08-17 ENCOUNTER — Other Ambulatory Visit: Payer: Self-pay | Admitting: Physician Assistant

## 2019-08-17 DIAGNOSIS — K580 Irritable bowel syndrome with diarrhea: Secondary | ICD-10-CM | POA: Diagnosis not present

## 2019-08-17 DIAGNOSIS — M069 Rheumatoid arthritis, unspecified: Secondary | ICD-10-CM | POA: Diagnosis present

## 2019-08-17 DIAGNOSIS — K319 Disease of stomach and duodenum, unspecified: Secondary | ICD-10-CM | POA: Diagnosis present

## 2019-08-17 DIAGNOSIS — Z681 Body mass index (BMI) 19 or less, adult: Secondary | ICD-10-CM

## 2019-08-17 DIAGNOSIS — G4700 Insomnia, unspecified: Secondary | ICD-10-CM | POA: Diagnosis not present

## 2019-08-17 DIAGNOSIS — F411 Generalized anxiety disorder: Secondary | ICD-10-CM | POA: Diagnosis not present

## 2019-08-17 DIAGNOSIS — K648 Other hemorrhoids: Secondary | ICD-10-CM | POA: Diagnosis not present

## 2019-08-17 DIAGNOSIS — K5900 Constipation, unspecified: Secondary | ICD-10-CM | POA: Diagnosis not present

## 2019-08-17 DIAGNOSIS — N319 Neuromuscular dysfunction of bladder, unspecified: Secondary | ICD-10-CM | POA: Diagnosis not present

## 2019-08-17 DIAGNOSIS — R1013 Epigastric pain: Secondary | ICD-10-CM | POA: Diagnosis not present

## 2019-08-17 DIAGNOSIS — E871 Hypo-osmolality and hyponatremia: Secondary | ICD-10-CM | POA: Diagnosis not present

## 2019-08-17 DIAGNOSIS — M810 Age-related osteoporosis without current pathological fracture: Secondary | ICD-10-CM | POA: Diagnosis present

## 2019-08-17 DIAGNOSIS — K521 Toxic gastroenteritis and colitis: Principal | ICD-10-CM | POA: Diagnosis present

## 2019-08-17 DIAGNOSIS — K625 Hemorrhage of anus and rectum: Secondary | ICD-10-CM | POA: Diagnosis not present

## 2019-08-17 DIAGNOSIS — F17210 Nicotine dependence, cigarettes, uncomplicated: Secondary | ICD-10-CM | POA: Diagnosis present

## 2019-08-17 DIAGNOSIS — R52 Pain, unspecified: Secondary | ICD-10-CM | POA: Diagnosis not present

## 2019-08-17 DIAGNOSIS — R109 Unspecified abdominal pain: Secondary | ICD-10-CM | POA: Diagnosis not present

## 2019-08-17 DIAGNOSIS — E739 Lactose intolerance, unspecified: Secondary | ICD-10-CM | POA: Diagnosis present

## 2019-08-17 DIAGNOSIS — D539 Nutritional anemia, unspecified: Secondary | ICD-10-CM | POA: Diagnosis present

## 2019-08-17 DIAGNOSIS — Z20828 Contact with and (suspected) exposure to other viral communicable diseases: Secondary | ICD-10-CM | POA: Diagnosis not present

## 2019-08-17 DIAGNOSIS — R197 Diarrhea, unspecified: Secondary | ICD-10-CM | POA: Diagnosis present

## 2019-08-17 DIAGNOSIS — K449 Diaphragmatic hernia without obstruction or gangrene: Secondary | ICD-10-CM | POA: Diagnosis not present

## 2019-08-17 DIAGNOSIS — K52831 Collagenous colitis: Secondary | ICD-10-CM | POA: Diagnosis not present

## 2019-08-17 DIAGNOSIS — I447 Left bundle-branch block, unspecified: Secondary | ICD-10-CM | POA: Diagnosis present

## 2019-08-17 DIAGNOSIS — I872 Venous insufficiency (chronic) (peripheral): Secondary | ICD-10-CM | POA: Diagnosis not present

## 2019-08-17 DIAGNOSIS — J449 Chronic obstructive pulmonary disease, unspecified: Secondary | ICD-10-CM | POA: Diagnosis not present

## 2019-08-17 DIAGNOSIS — R32 Unspecified urinary incontinence: Secondary | ICD-10-CM | POA: Diagnosis not present

## 2019-08-17 DIAGNOSIS — R531 Weakness: Secondary | ICD-10-CM | POA: Diagnosis not present

## 2019-08-17 DIAGNOSIS — R339 Retention of urine, unspecified: Secondary | ICD-10-CM | POA: Diagnosis not present

## 2019-08-17 DIAGNOSIS — K219 Gastro-esophageal reflux disease without esophagitis: Secondary | ICD-10-CM | POA: Diagnosis not present

## 2019-08-17 DIAGNOSIS — K635 Polyp of colon: Secondary | ICD-10-CM | POA: Diagnosis present

## 2019-08-17 DIAGNOSIS — E872 Acidosis: Secondary | ICD-10-CM | POA: Diagnosis present

## 2019-08-17 DIAGNOSIS — Z4800 Encounter for change or removal of nonsurgical wound dressing: Secondary | ICD-10-CM | POA: Diagnosis not present

## 2019-08-17 DIAGNOSIS — K5730 Diverticulosis of large intestine without perforation or abscess without bleeding: Secondary | ICD-10-CM | POA: Diagnosis not present

## 2019-08-17 DIAGNOSIS — R338 Other retention of urine: Secondary | ICD-10-CM | POA: Diagnosis not present

## 2019-08-17 DIAGNOSIS — D509 Iron deficiency anemia, unspecified: Secondary | ICD-10-CM | POA: Diagnosis present

## 2019-08-17 DIAGNOSIS — K222 Esophageal obstruction: Secondary | ICD-10-CM | POA: Diagnosis not present

## 2019-08-17 DIAGNOSIS — E43 Unspecified severe protein-calorie malnutrition: Secondary | ICD-10-CM | POA: Diagnosis not present

## 2019-08-17 DIAGNOSIS — R195 Other fecal abnormalities: Secondary | ICD-10-CM | POA: Diagnosis not present

## 2019-08-17 DIAGNOSIS — K2970 Gastritis, unspecified, without bleeding: Secondary | ICD-10-CM | POA: Diagnosis not present

## 2019-08-17 DIAGNOSIS — K529 Noninfective gastroenteritis and colitis, unspecified: Secondary | ICD-10-CM | POA: Diagnosis not present

## 2019-08-17 DIAGNOSIS — E785 Hyperlipidemia, unspecified: Secondary | ICD-10-CM | POA: Diagnosis not present

## 2019-08-17 DIAGNOSIS — Z881 Allergy status to other antibiotic agents status: Secondary | ICD-10-CM

## 2019-08-17 DIAGNOSIS — I6523 Occlusion and stenosis of bilateral carotid arteries: Secondary | ICD-10-CM | POA: Diagnosis not present

## 2019-08-17 DIAGNOSIS — M5136 Other intervertebral disc degeneration, lumbar region: Secondary | ICD-10-CM | POA: Diagnosis not present

## 2019-08-17 DIAGNOSIS — K3189 Other diseases of stomach and duodenum: Secondary | ICD-10-CM | POA: Diagnosis not present

## 2019-08-17 DIAGNOSIS — L89152 Pressure ulcer of sacral region, stage 2: Secondary | ICD-10-CM | POA: Diagnosis not present

## 2019-08-17 DIAGNOSIS — M5417 Radiculopathy, lumbosacral region: Secondary | ICD-10-CM | POA: Diagnosis not present

## 2019-08-17 DIAGNOSIS — M1990 Unspecified osteoarthritis, unspecified site: Secondary | ICD-10-CM | POA: Diagnosis not present

## 2019-08-17 DIAGNOSIS — E860 Dehydration: Secondary | ICD-10-CM | POA: Diagnosis not present

## 2019-08-17 DIAGNOSIS — I7300 Raynaud's syndrome without gangrene: Secondary | ICD-10-CM | POA: Diagnosis not present

## 2019-08-17 DIAGNOSIS — D62 Acute posthemorrhagic anemia: Secondary | ICD-10-CM | POA: Diagnosis not present

## 2019-08-17 DIAGNOSIS — E876 Hypokalemia: Secondary | ICD-10-CM | POA: Diagnosis present

## 2019-08-17 DIAGNOSIS — K581 Irritable bowel syndrome with constipation: Secondary | ICD-10-CM | POA: Diagnosis present

## 2019-08-17 DIAGNOSIS — Z79899 Other long term (current) drug therapy: Secondary | ICD-10-CM

## 2019-08-17 DIAGNOSIS — Z91040 Latex allergy status: Secondary | ICD-10-CM

## 2019-08-17 DIAGNOSIS — F17200 Nicotine dependence, unspecified, uncomplicated: Secondary | ICD-10-CM | POA: Diagnosis not present

## 2019-08-17 DIAGNOSIS — F331 Major depressive disorder, recurrent, moderate: Secondary | ICD-10-CM | POA: Diagnosis not present

## 2019-08-17 DIAGNOSIS — G834 Cauda equina syndrome: Secondary | ICD-10-CM | POA: Diagnosis not present

## 2019-08-17 DIAGNOSIS — D123 Benign neoplasm of transverse colon: Secondary | ICD-10-CM | POA: Diagnosis not present

## 2019-08-17 DIAGNOSIS — L89150 Pressure ulcer of sacral region, unstageable: Secondary | ICD-10-CM | POA: Diagnosis present

## 2019-08-17 DIAGNOSIS — Z87440 Personal history of urinary (tract) infections: Secondary | ICD-10-CM

## 2019-08-17 DIAGNOSIS — I10 Essential (primary) hypertension: Secondary | ICD-10-CM | POA: Diagnosis not present

## 2019-08-17 DIAGNOSIS — D124 Benign neoplasm of descending colon: Secondary | ICD-10-CM | POA: Diagnosis not present

## 2019-08-17 DIAGNOSIS — Z888 Allergy status to other drugs, medicaments and biological substances status: Secondary | ICD-10-CM

## 2019-08-17 DIAGNOSIS — N179 Acute kidney failure, unspecified: Secondary | ICD-10-CM | POA: Diagnosis not present

## 2019-08-17 DIAGNOSIS — Y92009 Unspecified place in unspecified non-institutional (private) residence as the place of occurrence of the external cause: Secondary | ICD-10-CM | POA: Diagnosis not present

## 2019-08-17 DIAGNOSIS — W19XXXA Unspecified fall, initial encounter: Secondary | ICD-10-CM | POA: Diagnosis not present

## 2019-08-17 DIAGNOSIS — K6389 Other specified diseases of intestine: Secondary | ICD-10-CM | POA: Diagnosis not present

## 2019-08-17 DIAGNOSIS — Z7952 Long term (current) use of systemic steroids: Secondary | ICD-10-CM

## 2019-08-17 DIAGNOSIS — E538 Deficiency of other specified B group vitamins: Secondary | ICD-10-CM | POA: Diagnosis not present

## 2019-08-17 DIAGNOSIS — T3695XA Adverse effect of unspecified systemic antibiotic, initial encounter: Secondary | ICD-10-CM | POA: Diagnosis present

## 2019-08-17 DIAGNOSIS — K2950 Unspecified chronic gastritis without bleeding: Secondary | ICD-10-CM | POA: Diagnosis not present

## 2019-08-17 LAB — CBC WITH DIFFERENTIAL/PLATELET
Abs Immature Granulocytes: 0.04 10*3/uL (ref 0.00–0.07)
Basophils Absolute: 0 10*3/uL (ref 0.0–0.1)
Basophils Relative: 0 %
Eosinophils Absolute: 0 10*3/uL (ref 0.0–0.5)
Eosinophils Relative: 0 %
HCT: 28.7 % — ABNORMAL LOW (ref 36.0–46.0)
Hemoglobin: 9.9 g/dL — ABNORMAL LOW (ref 12.0–15.0)
Immature Granulocytes: 0 %
Lymphocytes Relative: 6 %
Lymphs Abs: 0.6 10*3/uL — ABNORMAL LOW (ref 0.7–4.0)
MCH: 37.2 pg — ABNORMAL HIGH (ref 26.0–34.0)
MCHC: 34.5 g/dL (ref 30.0–36.0)
MCV: 107.9 fL — ABNORMAL HIGH (ref 80.0–100.0)
Monocytes Absolute: 0.4 10*3/uL (ref 0.1–1.0)
Monocytes Relative: 4 %
Neutro Abs: 8.4 10*3/uL — ABNORMAL HIGH (ref 1.7–7.7)
Neutrophils Relative %: 90 %
Platelets: 236 10*3/uL (ref 150–400)
RBC: 2.66 MIL/uL — ABNORMAL LOW (ref 3.87–5.11)
RDW: 13 % (ref 11.5–15.5)
WBC: 9.4 10*3/uL (ref 4.0–10.5)
nRBC: 0 % (ref 0.0–0.2)

## 2019-08-17 LAB — COMPREHENSIVE METABOLIC PANEL
ALT: 26 U/L (ref 0–44)
AST: 23 U/L (ref 15–41)
Albumin: 3.6 g/dL (ref 3.5–5.0)
Alkaline Phosphatase: 41 U/L (ref 38–126)
Anion gap: 11 (ref 5–15)
BUN: 18 mg/dL (ref 8–23)
CO2: 19 mmol/L — ABNORMAL LOW (ref 22–32)
Calcium: 9.4 mg/dL (ref 8.9–10.3)
Chloride: 96 mmol/L — ABNORMAL LOW (ref 98–111)
Creatinine, Ser: 1.37 mg/dL — ABNORMAL HIGH (ref 0.44–1.00)
GFR calc Af Amer: 44 mL/min — ABNORMAL LOW (ref >60)
GFR calc non Af Amer: 38 mL/min — ABNORMAL LOW (ref >60)
Glucose, Bld: 134 mg/dL — ABNORMAL HIGH (ref 70–99)
Potassium: 4.4 mmol/L (ref 3.5–5.1)
Sodium: 126 mmol/L — ABNORMAL LOW (ref 135–145)
Total Bilirubin: 0.5 mg/dL (ref 0.3–1.2)
Total Protein: 6.5 g/dL (ref 6.5–8.1)

## 2019-08-17 LAB — POC OCCULT BLOOD, ED: Fecal Occult Bld: POSITIVE — AB

## 2019-08-17 LAB — HEMOGLOBIN AND HEMATOCRIT, BLOOD
HCT: 24.3 % — ABNORMAL LOW (ref 36.0–46.0)
Hemoglobin: 8.6 g/dL — ABNORMAL LOW (ref 12.0–15.0)

## 2019-08-17 LAB — PROTIME-INR
INR: 1 (ref 0.8–1.2)
Prothrombin Time: 12.7 seconds (ref 11.4–15.2)

## 2019-08-17 LAB — TYPE AND SCREEN
ABO/RH(D): A POS
Antibody Screen: NEGATIVE

## 2019-08-17 MED ORDER — METHOCARBAMOL 500 MG PO TABS: 500.0000 mg | ORAL_TABLET | Freq: Two times a day (BID) | ORAL | Status: DC | PRN

## 2019-08-17 MED ORDER — CARVEDILOL 12.5 MG PO TABS: 6.2500 mg | ORAL_TABLET | Freq: Two times a day (BID) | ORAL | Status: DC

## 2019-08-17 MED ORDER — FERROUS SULFATE 325 (65 FE) MG PO TABS
325.0000 mg | ORAL_TABLET | Freq: Every day | ORAL | Status: DC
  Administered 2019-08-18 – 2019-08-23 (×6): 325 mg via ORAL
  Filled 2019-08-17 (×6): qty 1

## 2019-08-17 MED ORDER — ENOXAPARIN SODIUM 40 MG/0.4ML ~~LOC~~ SOLN
40.0000 mg | SUBCUTANEOUS | Status: DC
  Administered 2019-08-17: 21:00:00 40 mg via SUBCUTANEOUS
  Filled 2019-08-17: qty 0.4

## 2019-08-17 MED ORDER — HYDROCORTISONE 5 MG PO TABS
5.0000 mg | ORAL_TABLET | Freq: Every evening | ORAL | Status: DC
  Administered 2019-08-17 – 2019-08-22 (×6): 5 mg via ORAL
  Filled 2019-08-17 (×6): qty 1

## 2019-08-17 MED ORDER — ACETAMINOPHEN 500 MG PO TABS
1000.0000 mg | ORAL_TABLET | Freq: Three times a day (TID) | ORAL | Status: DC | PRN
  Administered 2019-08-17 – 2019-08-20 (×3): 1000 mg via ORAL
  Filled 2019-08-17 (×4): qty 2

## 2019-08-17 MED ORDER — SODIUM CHLORIDE 0.9 % IV BOLUS
1000.0000 mL | Freq: Once | INTRAVENOUS | Status: AC
  Administered 2019-08-17: 1000 mL via INTRAVENOUS

## 2019-08-17 MED ORDER — ATORVASTATIN CALCIUM 40 MG PO TABS: 40.0000 mg | ORAL_TABLET | Freq: Every day | ORAL | Status: DC

## 2019-08-17 MED ORDER — ENOXAPARIN SODIUM 40 MG/0.4ML ~~LOC~~ SOLN: 40.0000 mg | SUBCUTANEOUS | Status: DC

## 2019-08-17 MED ORDER — NUTRITIONAL SUPPLEMENT PO LIQD: 120.0000 mL | Freq: Three times a day (TID) | ORAL | Status: DC

## 2019-08-17 MED ORDER — SODIUM CHLORIDE 0.9 % IV SOLN: INTRAVENOUS | Status: AC

## 2019-08-17 MED ORDER — HYDRALAZINE HCL 25 MG PO TABS
25.0000 mg | ORAL_TABLET | Freq: Four times a day (QID) | ORAL | Status: DC
  Administered 2019-08-17 – 2019-08-21 (×13): 25 mg via ORAL
  Filled 2019-08-17 (×14): qty 1

## 2019-08-17 MED ORDER — DILTIAZEM HCL ER BEADS 240 MG PO CP24
360.0000 mg | ORAL_CAPSULE | Freq: Every day | ORAL | Status: DC
  Filled 2019-08-17: qty 1

## 2019-08-17 NOTE — ED Notes (Signed)
First attempt to call report unsuccessful. 

## 2019-08-17 NOTE — H&P (Addendum)
History and Physical    Regina BLACKSHER [id number] DOB: [birth year] DOA: 08/17/2019  PCP: Maurice Small, MD   Patient coming from: Home  I have personally briefly reviewed patient's old medical records in [place]  Chief Complaint: Diarrhea  HPI: Regina Ortiz is a 73 y.o. female with medical history significant of with medical history significant of indwelling Foley since 05/10/2019 neurogenic bladder, herniated disks L4/L5 with cauda equina syndrome s/p surgical decompression on 01/28/2019 by Dr. Ellene Route,  pertension, chronic hyponatremia, rheumatoid arthritis on chronic steroid, COPD, macrocytic chronic iron deficiency anemia, and GERD, chronic colitis presented with new onset diarrhea for 3 days. Had her Foley exchanged 2 weeks ago, developed UTI catheter associated 1 week ago.  She went to her urology and was started on Augmentin for 7 days which she completed yesterday.  She started to develop diarrhea about 3 days ago, became severe since yesterday.  Had a total of 5-6 episodes today, diarrhea has been dark-colored.  Also developed cramping like abdominal pain for 3 days, her abdominal pain is localized, 5-6/10 in severity, she could not eat since this morning because the abdominal pain.  Denied any nauseous vomiting, no chest pain no short of breath no cough, or dysuria.  She had 2 episodes of C. difficile colitis in her past, she reported 2 times, and the symptoms are different compared to this time. She also had had chronic colitis, she attributed that to her chronic steroid use.  She saidl usually flareup of her chronic colitis is just loose to watery diarrhea no dark-colored. No known Covid contacts. ED Course: C diff was sent, ED physician Dr. Melina Copa talked to GI physician who recommended Hydration and possible colonoscope.  Review of Systems: As per HPI otherwise 10 point review of systems negative.    Past Medical History:  Diagnosis Date  . AIN grade I   . Anxiety   .  Bruises easily   . Chronic diarrhea   . Chronic hyponatremia   . Colitis   . COPD (chronic obstructive pulmonary disease) ([place])   . Depression   . GERD (gastroesophageal reflux disease)   . History of chronic bronchitis   . History of vulvar dysplasia    serveral recurrency's   . Hypertension   . LBBB (left bundle branch block) W/ PROLONGED PR   CARDIOLOGSIT-  DR Johnsie Cancel-   LOV IN EPIC  . Macrocytosis   . Nocturia   . Osteoporosis   . Seronegative rheumatoid arthritis ([place])   . Short of breath on exertion   . Smokers' cough ([place])   . Venous insufficiency of leg    , Edema  . VIN III (vulvar intraepithelial neoplasia III)    RECURRENT -    Past Surgical History:  Procedure Laterality Date  . APPENDECTOMY  1992  . CARDIOVASCULAR STRESS TEST  06-23-2012  DR Jenkins [place]   NORMAL LEXISCAN MYOVIEW/ EF 83%/  NO ISCHEMIA  . CO2 LASER APPLICATION  [id number]   Procedure: CO2 LASER APPLICATION;  Surgeon: Janie Morning, MD PHD;  Location: [facility];  Service: Gynecology;  Laterality: N/A;  Laser Vaporization  . CO2 LASER APPLICATION N/A [id number]   Procedure: CO2 LASER APPLICATION;  Surgeon: [name], MD;  Location: [facility];  Service: Gynecology;  Laterality: N/A;  . CO2 LASER APPLICATION N/A [id number]   Procedure: CO2 LASER APPLICATION of the vulva;  Surgeon: Janie Morning, MD;  Location[facility];  Service: Gynecology;  Laterality:  N/A;  . EXCISION VULVAR LESIONS  07/2012  . HEMORRHOID SURGERY N/A 04/15/2013   Procedure: Excision of external and internal  hemorrhoid with AIN, Exam under anesthesia;  Surgeon: Odis Hollingshead, MD;  Location: [facility];  Service: General;  Laterality: N/A;  . HIP PINNING,CANNULATED Right 03/15/2019   Procedure: Maebelle Munroe OF FEMORAL NECK FRACTURE;  Surgeon: Renette Butters, MD;  Location: [place];  Service: Orthopedics;  Laterality: Right;  . REPAIR FISTULA IN ANO/  I & D PERIRECTAL ABSCESS  10-26-2002   DR Johnathan Hausen  . TRANSTHORACIC ECHOCARDIOGRAM  06-23-2012   NORMAL LVSF/ EF 55-60%  . VAGINAL HYSTERECTOMY  1997   partial  . VULVAR LESION REMOVAL N/A 11/03/2014   Procedure: CO2 LASER OF VULVAR ;  Surgeon: [name], MD;  Location: [facility];  Service: Gynecology;  Laterality: N/A;  . VULVECTOMY N/A 03/26/2015   Procedure: WIDE LOCA  EXCISION OF VULVAR;  Surgeon: [name], MD;  Location: [place];  Service: Gynecology;  Laterality: N/A;  . Wide Local Excision of labia majora  04-04-2010   Right-sided lesion, CO2 ablation of right labia minora     reports that she has been smoking cigarettes. She has a 12.50 pack-year smoking history. She has never used smokeless tobacco. She reports current alcohol use of about 2.0 standard drinks of alcohol per week. She reports that she does not use drugs.  Allergies  Allergen Reactions  . Latex Swelling and Other (See Comments)     fever blisters  . Augmentin [Amoxicillin-Pot Clavulanate] Nausea And Vomiting  . Doxycycline Other (See Comments)    Blurred vision, nervous, unsteady  . Lactose Intolerance (Gi) Other (See Comments)    Unknown, per pt  . Lialda [Mesalamine] Diarrhea  . Prinivil [Lisinopril] Diarrhea  . Triamcinolone Acetonide Other (See Comments)    REDNESS AND PAIN  . Amlodipine Rash  . Lotensin [Benazepril Hcl] Rash    Family History  Problem Relation Age of Onset  . Diabetes Mother   . Hypertension Father   . Lymphoma Son   . Cancer Son        lymphoblastic lymphoma     Prior to Admission medications   Medication Sig Start Date End Date Taking? Authorizing Provider  acetaminophen (TYLENOL) 500 MG tablet Take 2 tablets (1,000 mg total) by mouth every 8 (eight) hours as needed. 04/15/19   Medina-Vargas, Monina C, NP  atorvastatin (LIPITOR) 40 MG tablet Take 1 tablet (40 mg total) by mouth daily. 04/15/19 05/15/19  Medina-Vargas, Monina C, NP  bisacodyl (DULCOLAX) 10 MG suppository If  not relieved by MOM, give 10 mg Bisacodyl suppositiory rectally X 1 dose in 24 hours as needed (Do not use constipation standing orders for residents with renal failure/CFR less than 30. Contact MD for orders) (Physician Order)    [provider]  Calcium Carbonate-Vitamin D (CALTRATE 600+D PO) Take 1 tablet by mouth daily.     [provider]  carvedilol (COREG) 6.25 MG tablet Take 1 tablet (6.25 mg total) by mouth 2 (two) times daily with a meal for 60 doses. 04/15/19 05/15/19  Medina-Vargas, Monina C, NP  diltiazem (TIAZAC) 360 MG 24 hr capsule Take 1 capsule (360 mg total) by mouth daily for 30 doses. 04/15/19 05/15/19  Medina-Vargas, Monina C, NP  enoxaparin (LOVENOX) 40 MG/0.4ML injection Inject 0.4 mLs (40 mg total) into the skin daily for 1 day. INJECT 0.4ML (40MG ) SUBCUTANEOUSLY ONCE DAILY FOR 1 MONTH FOR DVT PROPHYLAXIS 04/15/19  04/16/19  Medina-Vargas, Monina C, NP  ferrous sulfate 325 (65 FE) MG tablet Take 1 tablet (325 mg total) by mouth daily for 30 doses. For Anemia 04/15/19 05/15/19  Medina-Vargas, Monina C, NP  hydrALAZINE (APRESOLINE) 25 MG tablet Take 1 tablet (25 mg total) by mouth 4 (four) times daily for 120 doses. For HTN 04/15/19 05/15/19  Medina-Vargas, Monina C, NP  hydrocortisone (CORTEF) 5 MG tablet Take 1 tablet (5 mg total) by mouth every evening. DX: POSSIBLE ADRENAL INSUFFCIENCY 04/15/19   Medina-Vargas, Monina C, NP  magnesium hydroxide (MILK OF MAGNESIA) 400 MG/5ML suspension If no BM in 3 days, give 30 cc Milk of Magnesium p.o. x 1 dose in 24 hours as needed (Do not use standing constipation orders for residents with renal failure CFR less than 30. Contact MD for orders) (Physician Order)    [provider]  methocarbamol (ROBAXIN) 500 MG tablet Take 1 tablet (500 mg total) by mouth 2 (two) times daily as needed for up to 20 doses for muscle spasms. For spasms 04/15/19   Medina-Vargas, Monina C, NP  NON FORMULARY Apply foam dressing to sacrum 2 x week for  protection    [provider]  Nutritional Supplement LIQD Take 120 mLs by mouth 2 (two) times a day. [employer]    [provider]  Sodium Phosphates (RA SALINE ENEMA RE) If not relieved by Biscodyl suppository, give disposable Saline Enema rectally X 1 dose/24 hrs as needed (Do not use constipation standing orders for residents with renal failure/CFR less than 30. Contact MD for orders)(Physician Or    [provider]    Physical Exam: Vitals:   08/17/19 1700 08/17/19 1715 08/17/19 1730 08/17/19 1745  BP: (!) 139/52 (!) 131/54 (!) 137/47 (!) 132/55  Pulse: 80 83 73 81  Resp: 19 (!) 22 16 16   Temp:      TempSrc:      SpO2: 98% 93% 97% 98%    Constitutional: NAD, calm, comfortable Vitals:   08/17/19 1700 08/17/19 1715 08/17/19 1730 08/17/19 1745  BP: (!) 139/52 (!) 131/54 (!) 137/47 (!) 132/55  Pulse: 80 83 73 81  Resp: 19 (!) 22 16 16   Temp:      TempSrc:      SpO2: 98% 93% 97% 98%   Eyes: PERRL, lids and conjunctivae normal ENMT: Mucous membranes are moist. Posterior pharynx clear of any exudate or lesions.Normal dentition.  Neck: normal, supple, no masses, no thyromegaly Respiratory: clear to auscultation bilaterally, no wheezing, no crackles. Normal respiratory effort. No accessory muscle use.  Cardiovascular: Regular rate and rhythm, no murmurs / rubs / gallops. No extremity edema. 2+ pedal pulses. No carotid bruits.  Abdomen: Mild tenderness to palpation around periumbilical area, no masses palpated. No hepatosplenomegaly. Bowel sounds positive.  Musculoskeletal: no clubbing / cyanosis. No joint deformity upper and lower extremities. Good ROM, no contractures. Normal muscle tone.  Skin: Rash on sacrum area. No induration Neurologic: CN 2-12 grossly intact. Sensation intact, DTR normal. Strength 5/5 in all 4.  Psychiatric: Normal judgment and insight. Alert and oriented x 3. Normal mood.    Labs on Admission: I have personally reviewed following labs  and imaging studies  CBC: Recent Labs  Lab 08/17/19 1504  WBC 9.4  NEUTROABS 8.4*  HGB 9.9*  HCT 28.7*  MCV 107.9*  PLT [id number]   Basic Metabolic Panel: Recent Labs  Lab 08/17/19 1504  NA 126*  K 4.4  CL 96*  CO2 19*  GLUCOSE 134*  BUN 18  CREATININE 1.37*  CALCIUM 9.4   GFR: CrCl cannot be calculated (Unknown ideal weight.). Liver Function Tests: Recent Labs  Lab 08/17/19 1504  AST 23  ALT 26  ALKPHOS 41  BILITOT 0.5  PROT 6.5  ALBUMIN 3.6   No results for input(s): LIPASE, AMYLASE in the last 168 hours. No results for input(s): AMMONIA in the last 168 hours. Coagulation Profile: Recent Labs  Lab 08/17/19 1504  INR 1.0   Cardiac Enzymes: No results for input(s): CKTOTAL, CKMB, CKMBINDEX, TROPONINI in the last 168 hours. BNP (last 3 results) No results for input(s): PROBNP in the last 8760 hours. HbA1C: No results for input(s): HGBA1C in the last 72 hours. CBG: No results for input(s): GLUCAP in the last 168 hours. Lipid Profile: No results for input(s): CHOL, HDL, LDLCALC, TRIG, CHOLHDL, LDLDIRECT in the last 72 hours. Thyroid Function Tests: No results for input(s): TSH, T4TOTAL, FREET4, T3FREE, THYROIDAB in the last 72 hours. Anemia Panel: No results for input(s): VITAMINB12, FOLATE, FERRITIN, TIBC, IRON, RETICCTPCT in the last 72 hours. Urine analysis:    Component Value Date/Time   COLORURINE YELLOW 03/12/2019 1148   APPEARANCEUR CLEAR 03/12/2019 1148   LABSPEC 1.003 (L) 03/12/2019 1148   PHURINE 7.0 03/12/2019 1148   GLUCOSEU NEGATIVE 03/12/2019 1148   HGBUR NEGATIVE 03/12/2019 1148   [place] 03/12/2019 1148   KETONESUR NEGATIVE 03/12/2019 1148   PROTEINUR 30 (A) 03/12/2019 1148   UROBILINOGEN 0.2 09/22/2012 1100   NITRITE POSITIVE (A) 03/12/2019 1148   LEUKOCYTESUR MODERATE (A) 03/12/2019 1148    Radiological Exams on Admission: DG BONE DENSITY (DXA)  Result Date: 08/16/2019 EXAM: DUAL X-RAY ABSORPTIOMETRY (DXA) FOR  BONE MINERAL DENSITY IMPRESSION: Referring Physician:  Gavin Pound Your patient completed a BMD test using Lunar IDXA DXA system ( analysis version: 16 ) manufactured by [employer]. Technologist: CG PATIENT: Name: Merlee, Kellman Patient ID: [id number] Birth Date: 09-21-1945 Height: 61.0 in. Sex: Female Measured: 08/16/2019 Weight: 87.8 lbs. Indications: Advanced Age, Caucasian, Desyrel, Estrogen Deficient, Hysterectomy, Long term steroid use, Prednisone, Prilosec, Right hip replacement, Wellbutrin Fractures: Rt hip Treatments: Calcium (E943.0), Fosamax, Vitamin D (E933.5) ASSESSMENT: The BMD measured at Femur Neck is 0.633 g/cm2 with a T-score of -2.9. This patient is considered osteoporotic according to [facility]) criteria. There has been a statistically significant decrease in BMD of Left hip since prior exam dated 08/12/2018. The scan quality is limited by patient condition. Lumbar spine was not utilized due to exclusion on prior exam. Right femur was excluded due to surgical hardware. Site Region Measured Date Measured Age [age]A BMD Significant CHANGE T-score Left Femur Neck 08/16/2019 74.4 -2.8 0.633 g/cm2 * Left Femur Neck 08/12/2018 73.4 -2.3 0.718 g/cm2 Left Forearm Radius 33% 08[age]16/2019 74.4 -1.2 0.778 g/cm2 Left Forearm Radius 33% 08/12/2018 73.4 -1.1 0.792 g/cm2 World Health Organization [facility]) criteria for post-menopausal, Caucasian Women: Normal       T-score at or above -1 SD Osteopenia   T-score between -1 and -2.5 SD Osteoporosis T-score at or below -2.5 SD RECOMMENDATION: 1. All patients should optimize calcium and vitamin D intake. 2. Consider FDA approved medical therapies in postmenopausal women and men aged 63 years and older, based on the following: a. A hip or vertebral (clinical or morphometric) fracture b. T- score < or = -2.5 at the femoral neck or spine after appropriate evaluation to exclude secondary causes c. Low bone mass (T-score between -1.0 and -2.5 at the  femoral neck or spine)  and a 10 year probability of a hip fracture > or = 3% or a 10 year probability of a major osteoporosis-related fracture > or = 20% based on the [place]-adapted WHO algorithm d. Clinician judgment and/or patient preferences may indicate treatment for people with 10-year fracture probabilities above or below these levels FOLLOW-UP: Patients with diagnosis of osteoporosis or at high risk for fracture should have regular bone mineral density tests. For patients eligible for Medicare, routine testing is allowed once every 2 years. The testing frequency can be increased to one year for patients who have rapidly progressing disease, those who are receiving or discontinuing medical therapy to restore bone mass, or have additional risk factors. [facility] Electronically Signed   By: Rolm Baptise M.D.   On: 08/16/2019 14:36    EKG: Independently reviewed.  KG was reviewed along with ER physician compared to her old EKG showed no significant ST-T changes.  Assessment/Plan Active Problems:   Diarrhea  Acute diarrhea, given the strong association with antibiotic usage, will need to rule out C. difficile colitis.  Patient describes her diarrhea as dark-colored, but her hemoglobin remained stable compared to 6 months ago, unlikely melena.  Occult blood was positive but she has been on iron pill. Check H&H every 8 hours for 1 day.  She also has chronic constipation, will hold off on her constipation meds.  Chronic hyponatremia, Na level at her baseline, volume wise, she is toward dry side, IVF for today.  According to her past H&P, fluid restriction did very little to her sodium level.  AKI, likely prerenal from dehydration, start IVF.  UTI versus colonization, she just completed treatment for UTI, as of now, she does not have any symptoms of dysuria, fever, or elevated WBC.  Hold off further antibiotic treatment.   HTN, continue home meds with perimeters.  Rheumatoid arthritis,  continue home dosage of her steroid, no indication for stressing dose.  Chronic sacral wound, unstageable, consult wound care.  Iron deficiency anemia, chronic, continue iron supplement.  Indwelling Foley catheter, changed 2 weeks ago, no acute issue.       DVT prophylaxis: Lovenox Code Status: Full code Family Communication: None at bedside. Disposition Plan: Probably need colonoscopy this time, likely will stay more than 2 midnights.  Consults called: Dr. Thereasa Parkin (ED physicain Dr. Melina Copa called) Admission status: Meds/surg  Lequita Halt MD [facility] Pager [phone]  If 7PM-7AM, please contact night-coverage [email or web address] Password [facility]  08/17/2019, 6:12 PM

## 2019-08-17 NOTE — ED Triage Notes (Signed)
Pt here from home with c/o diarrhea and dark tarry stools  ,pt is anemic and is on iron pills pt also has a foly that she has had going on about 5 months ( it has been changed each month )

## 2019-08-17 NOTE — Progress Notes (Signed)
New Admission Note:  Arrival Method: Via stretcher from ED to 69m20 Mental Orientation: Alert & Oriented x4 Telemetry: N/A Assessment: Completed Skin: Refer to flowsheet IV: Left AC Pain: 5/10 Tubes: Foley Catheter Safety Measures: Safety Fall Prevention Plan discussed with patient. Admission: Completed 5 [place] Orientation: Patient has been orientated to the room, unit and the staff.  Orders have been reviewed and are being implemented. Will continue to monitor the patient. Call light has been placed within reach and bed alarm has been activated.   Vassie Moselle, RN  Phone Number: [phone]

## 2019-08-17 NOTE — ED Provider Notes (Signed)
[facility] EMERGENCY DEPARTMENT Provider Note   CSN: [id number] Arrival date & time: 08/17/19  1443     History Chief Complaint  Patient presents with  . Diarrhea    Regina Ortiz is a 73 y.o. female.  She is presenting with complaint of diarrhea for 3 days.  She said it was 2 or 3 episodes a day in the beginning but today has been more constant.  Black liquid in nature.  She is on iron supplementation.  Is associated with some epigastric discomfort.  She also says she said decreased appetite.  No fevers or chills.  No shortness of breath.  She has a chronic Foley and was treated by her primary care doctor for a urine infection and just finished antibiotics yesterday.  The history is provided by the patient.  Diarrhea Quality:  Watery Severity:  Moderate Onset quality:  Gradual Number of episodes:  10 Duration:  3 days Timing:  Intermittent Progression:  Worsening Relieved by:  Nothing Worsened by:  Nothing Ineffective treatments:  Anti-motility medications and change in diet Associated symptoms: abdominal pain   Associated symptoms: no chills, no recent cough, no fever, no headaches, no myalgias and no vomiting   Risk factors: recent antibiotic use        Past Medical History:  Diagnosis Date  . AIN grade I   . Anxiety   . Bruises easily   . Chronic diarrhea   . Chronic hyponatremia   . Colitis   . COPD (chronic obstructive pulmonary disease) ([place])   . Depression   . GERD (gastroesophageal reflux disease)   . History of chronic bronchitis   . History of vulvar dysplasia    serveral recurrency's   . Hypertension   . LBBB (left bundle branch block) W/ PROLONGED PR   CARDIOLOGSIT-  DR Johnsie Cancel-   LOV IN EPIC  . Macrocytosis   . Nocturia   . Osteoporosis   . Seronegative rheumatoid arthritis ([place])   . Short of breath on exertion   . Smokers' cough ([place])   . Venous insufficiency of leg    , Edema  . VIN III (vulvar intraepithelial neoplasia  III)    RECURRENT -    Patient Active Problem List   Diagnosis Date Noted  . Urinary frequency 04/15/2019  . Osteoporosis   . Hyperlipidemia   . Adrenal insufficiency ([place]) 04/13/2019  . Uncontrolled hypertension 04/13/2019  . Bilateral lower extremity edema 04/13/2019  . UTI (urinary tract infection) 04/02/2019  . Pain on right lower extremity 03/31/2019  . Prolonged Q-T interval on ECG 03/22/2019  . Acute blood loss anemia 03/21/2019  . Pressure injury of skin 03/15/2019  . Fracture of femoral neck, right, closed ([place]) 03/14/2019  . Fall at home, initial encounter 03/12/2019  . Urinary retention 02/15/2019  . Lumbosacral radiculopathy 02/15/2019  . Constipation 02/15/2019  . Acute urinary retention 02/15/2019  . Cauda equina syndrome ([place]) 01/28/2019  . Chest pain 09/25/2017  . Hypertensive urgency 09/24/2017  . Headache 09/24/2017  . Alcohol use 09/24/2017  . AKI (acute kidney injury) ([place]) 09/24/2017  . Bilateral carotid artery stenosis   . Vulvovaginal candidiasis 10/22/2015  . Mild malnutrition ([place]) 07/07/2013  . Chronic bronchitis ([place]) 07/07/2013  . Tobacco abuse 07/07/2013  . Hypertension   . Short of breath on exertion   . Hemorrhoid   . LBBB (left bundle branch block)   . Anxiety   . Depression   . RA (rheumatoid arthritis) ([place])   .  Venous insufficiency of leg   . Chronic hyponatremia   . AIN grade I 04/05/2013  . Diarrhea 09/22/2012  . Hyponatremia 09/22/2012  . Hypokalemia 09/22/2012  . ARF (acute renal failure) ([place]) 09/22/2012  . Atypical chest pain 06/22/2012  . Vulvar intraepithelial neoplasia III (VIN III) 01/29/2012    Past Surgical History:  Procedure Laterality Date  . APPENDECTOMY  1992  . CARDIOVASCULAR STRESS TEST  06-23-2012  DR Jenkins [place]   NORMAL LEXISCAN MYOVIEW/ EF 83%/  NO ISCHEMIA  . CO2 LASER APPLICATION  [id number]   Procedure: CO2 LASER APPLICATION;  Surgeon: Janie Morning, MD PHD;  Location: [facility];   Service: Gynecology;  Laterality: N/A;  Laser Vaporization  . CO2 LASER APPLICATION N/A [id number]   Procedure: CO2 LASER APPLICATION;  Surgeon: [name], MD;  Location[facility];  Service: Gynecology;  Laterality: N/A;  . CO2 LASER APPLICATION N/A [id number]   Procedure: CO2 LASER APPLICATION of the vulva;  Surgeon: Janie Morning, MD;  Location: [facility];  Service: Gynecology;  Laterality: N/A;  . EXCISION VULVAR LESIONS  07/2012  . HEMORRHOID SURGERY N/A 04/15/2013   Procedure: Excision of external and internal  hemorrhoid with AIN, Exam under anesthesia;  Surgeon: Odis Hollingshead, MD;  Location: [facility];  Service: General;  Laterality: N/A;  . HIP PINNING,CANNULATED Right 03/15/2019   Procedure: Maebelle Munroe OF FEMORAL NECK FRACTURE;  Surgeon: Renette Butters, MD;  Location: [facility];  Service: Orthopedics;  Laterality: Right;  . REPAIR FISTULA IN ANO/  I & D PERIRECTAL ABSCESS  10-26-2002  DR Johnathan Hausen  . TRANSTHORACIC ECHOCARDIOGRAM  06-23-2012   NORMAL LVSF/ EF 55-60%  . VAGINAL HYSTERECTOMY  1997   partial  . VULVAR LESION REMOVAL N/A 11/03/2014   Procedure: CO2 LASER OF VULVAR ;  Surgeon: [name], MD;  Location: [facility];  Service: Gynecology;  Laterality: N/A;  . VULVECTOMY N/A 03/26/2015   Procedure: WIDE LOCA  EXCISION OF VULVAR;  Surgeon: [name], MD;  Location: [facility];  Service: Gynecology;  Laterality: N/A;  . Wide Local Excision of labia majora  04-04-2010   Right-sided lesion, CO2 ablation of right labia minora     OB History   No obstetric history on file.     Family History  Problem Relation Age of Onset  . Diabetes Mother   . Hypertension Father   . Lymphoma Son   . Cancer Son        lymphoblastic lymphoma    Social History   Tobacco Use  . Smoking status: Current Every Day Smoker    Packs/day: 0.25    Years: 50.00    Pack years: 12.50    Types: Cigarettes  . Smokeless tobacco:  Never Used  Substance Use Topics  . Alcohol use: Yes    Alcohol/week: 2.0 standard drinks    Types: 2 Cans of beer per week  . Drug use: No    Home Medications Prior to Admission medications   Medication Sig Start Date End Date Taking? Authorizing Provider  acetaminophen (TYLENOL) 500 MG tablet Take 2 tablets (1,000 mg total) by mouth every 8 (eight) hours as needed. 04/15/19   Medina-Vargas, Monina C, NP  atorvastatin (LIPITOR) 40 MG tablet Take 1 tablet (40 mg total) by mouth daily. 04/15/19 05/15/19  Medina-Vargas, Monina C, NP  bisacodyl (DULCOLAX) 10 MG suppository If not relieved by MOM, give 10 mg Bisacodyl suppositiory rectally X 1 dose in  24 hours as needed (Do not use constipation standing orders for residents with renal failure/CFR less than 30. Contact MD for orders) (Physician Order)    [provider]  Calcium Carbonate-Vitamin D (CALTRATE 600+D PO) Take 1 tablet by mouth daily.     [provider]  carvedilol (COREG) 6.25 MG tablet Take 1 tablet (6.25 mg total) by mouth 2 (two) times daily with a meal for 60 doses. 04/15/19 05/15/19  Medina-Vargas, Monina C, NP  diltiazem (TIAZAC) 360 MG 24 hr capsule Take 1 capsule (360 mg total) by mouth daily for 30 doses. 04/15/19 05/15/19  Medina-Vargas, Monina C, NP  enoxaparin (LOVENOX) 40 MG/0.4ML injection Inject 0.4 mLs (40 mg total) into the skin daily for 1 day. INJECT 0.4ML (40MG ) SUBCUTANEOUSLY ONCE DAILY FOR 1 MONTH FOR DVT PROPHYLAXIS 04/15/19 04/16/19  Medina-Vargas, Monina C, NP  ferrous sulfate 325 (65 FE) MG tablet Take 1 tablet (325 mg total) by mouth daily for 30 doses. For Anemia 04/15/19 05/15/19  Medina-Vargas, Monina C, NP  hydrALAZINE (APRESOLINE) 25 MG tablet Take 1 tablet (25 mg total) by mouth 4 (four) times daily for 120 doses. For HTN 04/15/19 05/15/19  Medina-Vargas, Monina C, NP  hydrocortisone (CORTEF) 5 MG tablet Take 1 tablet (5 mg total) by mouth every evening. DX: POSSIBLE ADRENAL INSUFFCIENCY 04/15/19    Medina-Vargas, Monina C, NP  magnesium hydroxide (MILK OF MAGNESIA) 400 MG/5ML suspension If no BM in 3 days, give 30 cc Milk of Magnesium p.o. x 1 dose in 24 hours as needed (Do not use standing constipation orders for residents with renal failure CFR less than 30. Contact MD for orders) (Physician Order)    [provider]  methocarbamol (ROBAXIN) 500 MG tablet Take 1 tablet (500 mg total) by mouth 2 (two) times daily as needed for up to 20 doses for muscle spasms. For spasms 04/15/19   Medina-Vargas, Monina C, NP  NON FORMULARY Apply foam dressing to sacrum 2 x week for protection    [provider]  Nutritional Supplement LIQD Take 120 mLs by mouth 2 (two) times a day. [employer]    [provider]  Sodium Phosphates (RA SALINE ENEMA RE) If not relieved by Biscodyl suppository, give disposable Saline Enema rectally X 1 dose/24 hrs as needed (Do not use constipation standing orders for residents with renal failure/CFR less than 30. Contact MD for orders)(Physician Or    [provider]    Allergies    Latex, Augmentin [amoxicillin-pot clavulanate], Doxycycline, Lactose intolerance (gi), Lialda [mesalamine], Prinivil [lisinopril], Triamcinolone acetonide, Amlodipine, and Lotensin [benazepril hcl]  Review of Systems   Review of Systems  Constitutional: Positive for appetite change. Negative for chills and fever.  HENT: Negative for sore throat.   Eyes: Negative for visual disturbance.  Respiratory: Negative for shortness of breath.   Cardiovascular: Negative for chest pain.  Gastrointestinal: Positive for abdominal pain and diarrhea. Negative for vomiting.  Genitourinary: Negative for dysuria.  Musculoskeletal: Negative for myalgias.  Skin: Negative for rash.  Neurological: Negative for headaches.    Physical Exam Updated Vital Signs BP (!) 139/55   Pulse 85   Temp 98 F (36.7 C) (Oral)   Resp 18   LMP  (LMP Unknown)   SpO2 99%   Physical Exam  Vitals and nursing note reviewed.  Constitutional:      General: She is not in acute distress.    Appearance: She is well-developed and underweight.  HENT:     Head: Normocephalic and atraumatic.  Eyes:     Conjunctiva/sclera: Conjunctivae normal.  Cardiovascular:     Rate and Rhythm: Normal rate and regular rhythm.     Heart sounds: No murmur.  Pulmonary:     Effort: Pulmonary effort is normal. No respiratory distress.     Breath sounds: Normal breath sounds.  Abdominal:     Palpations: Abdomen is soft.     Tenderness: There is no abdominal tenderness. There is no guarding or rebound.  Genitourinary:    Comments: Chronic Foley in place. Musculoskeletal:        General: Normal range of motion.     Cervical back: Neck supple.     Right lower leg: No edema.     Left lower leg: No edema.  Skin:    General: Skin is warm and dry.     Capillary Refill: Capillary refill takes less than 2 seconds.  Neurological:     Mental Status: She is alert. Mental status is at baseline.     ED Results / Procedures / Treatments   Labs (all labs ordered are listed, but only abnormal results are displayed) Labs Reviewed  COMPREHENSIVE METABOLIC PANEL - Abnormal; Notable for the following components:      Result Value   Sodium 126 (*)    Chloride 96 (*)    CO2 19 (*)    Glucose, Bld 134 (*)    Creatinine, Ser 1.37 (*)    GFR calc non Af Amer 38 (*)    GFR calc Af Amer 44 (*)    All other components within normal limits  CBC WITH DIFFERENTIAL/PLATELET - Abnormal; Notable for the following components:   RBC 2.66 (*)    Hemoglobin 9.9 (*)    HCT 28.7 (*)    MCV 107.9 (*)    MCH 37.2 (*)    Neutro Abs 8.4 (*)    Lymphs Abs 0.6 (*)    All other components within normal limits  BASIC METABOLIC PANEL - Abnormal; Notable for the following components:   Sodium 129 (*)    CO2 18 (*)    Creatinine, Ser 1.15 (*)    Calcium 8.3 (*)    GFR calc non Af Amer 47 (*)    GFR calc Af Amer 54 (*)     All other components within normal limits  HEMOGLOBIN AND HEMATOCRIT, BLOOD - Abnormal; Notable for the following components:   Hemoglobin 8.6 (*)    HCT 24.3 (*)    All other components within normal limits  HEMOGLOBIN AND HEMATOCRIT, BLOOD - Abnormal; Notable for the following components:   Hemoglobin 8.2 (*)    HCT 24.2 (*)    All other components within normal limits  POC OCCULT BLOOD, ED - Abnormal; Notable for the following components:   Fecal Occult Bld POSITIVE (*)    All other components within normal limits  C DIFFICILE QUICK SCREEN W PCR REFLEX  SARS CORONAVIRUS 2 (TAT 6-24 HRS)  GI PATHOGEN PANEL BY PCR, STOOL  PROTIME-INR  TYPE AND SCREEN    EKG EKG Interpretation  Date/Time:  Wednesday August 17 2019 15:57:34 EST Ventricular Rate:  78 PR Interval:    QRS Duration: 139 QT Interval:  425 QTC Calculation: 485 R Axis:   94 Text Interpretation: Right and left arm electrode reversal, interpretation assumes no reversal Sinus rhythm Borderline prolonged PR interval No significant change since last tracing Confirmed by Deno Etienne [phone]) on 08/18/2019 7:20:56 AM   Radiology DG BONE DENSITY (DXA)  Result Date:  08/16/2019 EXAM: DUAL X-RAY ABSORPTIOMETRY (DXA) FOR BONE MINERAL DENSITY IMPRESSION: Referring Physician:  Gavin Pound Your patient completed a BMD test using Lunar IDXA DXA system ( analysis version: 16 ) manufactured by [employer]. Technologist: CG PATIENT: Name: Nahdia, Preyer Patient ID: [id number] Birth Date: 12/30/1945 Height: 61.0 in. Sex: Female Measured: 08/16/2019 Weight: 87.8 lbs. Indications: Advanced Age, Caucasian, Desyrel, Estrogen Deficient, Hysterectomy, Long term steroid use, Prednisone, Prilosec, Right hip replacement, Wellbutrin Fractures: Rt hip Treatments: Calcium (E943.0), Fosamax, Vitamin D (E933.5) ASSESSMENT: The BMD measured at Femur Neck is 0.633 g/cm2 with a T-score of -2.9. This patient is considered osteoporotic according to [facility]) criteria. There has been a statistically significant decrease in BMD of Left hip since prior exam dated 08/12/2018. The scan quality is limited by patient condition. Lumbar spine was not utilized due to exclusion on prior exam. Right femur was excluded due to surgical hardware. Site Region Measured Date Measured Age [age]A BMD Significant CHANGE T-score Left Femur Neck 08/16/2019 74.4 -2.8 0.633 g/cm2 * Left Femur Neck 08/12/2018 73.4 -2.3 0.718 g/cm2 Left Forearm Radius 33% 08/16/2019 74.4 -1.2 0.778 g/cm2 Left Forearm Radius 33% 08/12/2018 73.4 -1.1 0.792 g/cm2 World Health Organization [facility]) criteria for post-menopausal, Caucasian Women: Normal       T-score at or above -1 SD Osteopenia   T-score between -1 and -2.5 SD Osteoporosis T-score at or below -2.5 SD RECOMMENDATION: 1. All patients should optimize calcium and vitamin D intake. 2. Consider FDA approved medical therapies in postmenopausal women and men aged 42 years and older, based on the following: a. A hip or vertebral (clinical or morphometric) fracture b. T- score < or = -2.5 at the femoral neck or spine after appropriate evaluation to exclude secondary causes c. Low bone mass (T-score between -1.0 and -2.5 at the femoral neck or spine) and a 10 year probability of a hip fracture > or = 3% or a 10 year probability of a major osteoporosis-related fracture > or = 20% based on the [place]-adapted WHO algorithm d. Clinician judgment and/or patient preferences may indicate treatment for people with 10-year fracture probabilities above or below these levels FOLLOW-UP: Patients with diagnosis of osteoporosis or at high risk for fracture should have regular bone mineral density tests. For patients eligible for Medicare, routine testing is allowed once every 2 years. The testing frequency can be increased to one year for patients who have rapidly progressing disease, those who are receiving or discontinuing medical therapy to restore bone  mass, or have additional risk factors. [facility] Radiology Electronically Signed   By: Rolm Baptise M.D.   On: 08/16/2019 14:36    Procedures Procedures (including critical care time)  Medications Ordered in ED Medications  acetaminophen (TYLENOL) tablet 1,000 mg (1,000 mg Oral Given 08/17/19 2332)  hydrALAZINE (APRESOLINE) tablet 25 mg (25 mg Oral Given 08/17/19 2332)  hydrocortisone (CORTEF) tablet 5 mg (5 mg Oral Given 08/17/19 2053)  ferrous sulfate tablet 325 mg (has no administration in time range)  0.9 %  sodium chloride infusion ( Intravenous New Bag/Given 08/17/19 2056)  enoxaparin (LOVENOX) injection 40 mg (40 mg Subcutaneous Given 08/17/19 2056)  Chlorhexidine Gluconate Cloth 2 % PADS 6 each (has no administration in time range)  diltiazem (CARDIZEM CD) 24 hr capsule 360 mg (has no administration in time range)  sodium chloride 0.9 % bolus 1,000 mL (0 mLs Intravenous Stopped 08/17/19 1855)    ED Course  I have reviewed the triage vital signs and the  nursing notes.  Pertinent labs & imaging results that were available during my care of the patient were reviewed by me and considered in my medical decision making (see chart for details).  Clinical Course as of Aug 17 956  Wed Aug 16, 3941  5942 73 year old female here with 3 days of intermittent diarrhea black in nature.  Differential includes GI bleed, C. difficile, infectious diarrhea, metabolic derangement, dehydration.   [MB]  1601 Hemoglobin low at 9.9 but at her baseline.   [MB]  [id number] Chemistry showing a sodium of 126 but it appears her baseline levels are more like 129.  Creatinine elevated at 1.37 from baseline 0.7.  Getting IV fluids.   [MB]  1612 EKG showing sinus rhythm with PR prolongation at a rate of 78.  Q waves anteriorly with J-point elevation ST elevation similar to prior EKG   [MB]  1619 Rectal exam with nurse as chaperone.  Normal sphincter tone.  Small amount of stool in vault sent for guaiac.  She  also has a sore on her right butt cheek with a dressing over it.   [MB]  [id number] Discussed with Dr. Therisa Doyne from [place].  She said it would be reasonable to admit the patient for further work-up.  She will follow as a Optometrist.   [MB]  [id number] Discussed with [facility] hospitalist Dr. Roosevelt Locks who accepts the patient for admission.   [MB]    Clinical Course User Index [MB] Hayden Rasmussen, MD   MDM Rules/Calculators/A&P                      Final Clinical Impression(s) / ED Diagnoses Final diagnoses:  Rectal bleeding  AKI (acute kidney injury) ([place])  Weakness    Rx / DC Orders ED Discharge Orders    None       Hayden Rasmussen, MD 08/18/19 [phone]

## 2019-08-18 ENCOUNTER — Other Ambulatory Visit: Payer: Self-pay

## 2019-08-18 DIAGNOSIS — N179 Acute kidney failure, unspecified: Secondary | ICD-10-CM

## 2019-08-18 DIAGNOSIS — R197 Diarrhea, unspecified: Secondary | ICD-10-CM

## 2019-08-18 LAB — BASIC METABOLIC PANEL
Anion gap: 9 (ref 5–15)
BUN: 15 mg/dL (ref 8–23)
CO2: 18 mmol/L — ABNORMAL LOW (ref 22–32)
Calcium: 8.3 mg/dL — ABNORMAL LOW (ref 8.9–10.3)
Chloride: 102 mmol/L (ref 98–111)
Creatinine, Ser: 1.15 mg/dL — ABNORMAL HIGH (ref 0.44–1.00)
GFR calc Af Amer: 54 mL/min — ABNORMAL LOW (ref >60)
GFR calc non Af Amer: 47 mL/min — ABNORMAL LOW (ref >60)
Glucose, Bld: 95 mg/dL (ref 70–99)
Potassium: 4.9 mmol/L (ref 3.5–5.1)
Sodium: 129 mmol/L — ABNORMAL LOW (ref 135–145)

## 2019-08-18 LAB — C DIFFICILE QUICK SCREEN W PCR REFLEX
C Diff antigen: NEGATIVE
C Diff interpretation: NOT DETECTED
C Diff toxin: NEGATIVE

## 2019-08-18 LAB — HEMOGLOBIN AND HEMATOCRIT, BLOOD
HCT: 24.2 % — ABNORMAL LOW (ref 36.0–46.0)
HCT: 27.7 % — ABNORMAL LOW (ref 36.0–46.0)
Hemoglobin: 8.2 g/dL — ABNORMAL LOW (ref 12.0–15.0)
Hemoglobin: 9.9 g/dL — ABNORMAL LOW (ref 12.0–15.0)

## 2019-08-18 LAB — SARS CORONAVIRUS 2 (TAT 6-24 HRS): SARS Coronavirus 2: NEGATIVE

## 2019-08-18 MED ORDER — ADULT MULTIVITAMIN W/MINERALS CH
1.0000 | ORAL_TABLET | Freq: Every day | ORAL | Status: DC
  Administered 2019-08-18 – 2019-08-22 (×5): 1 via ORAL
  Filled 2019-08-18 (×6): qty 1

## 2019-08-18 MED ORDER — DILTIAZEM HCL ER COATED BEADS 180 MG PO CP24
360.0000 mg | ORAL_CAPSULE | Freq: Every day | ORAL | Status: DC
  Administered 2019-08-18 – 2019-08-23 (×6): 360 mg via ORAL
  Filled 2019-08-18 (×6): qty 2

## 2019-08-18 MED ORDER — PEG 3350-KCL-NA BICARB-NACL 420 G PO SOLR
4000.0000 mL | Freq: Once | ORAL | Status: AC
  Administered 2019-08-18: 4000 mL via ORAL
  Filled 2019-08-18: qty 4000

## 2019-08-18 MED ORDER — BOOST / RESOURCE BREEZE PO LIQD CUSTOM
1.0000 | Freq: Three times a day (TID) | ORAL | Status: DC
  Administered 2019-08-18 – 2019-08-23 (×13): 1 via ORAL

## 2019-08-18 MED ORDER — CHLORHEXIDINE GLUCONATE CLOTH 2 % EX PADS
6.0000 | MEDICATED_PAD | Freq: Every day | CUTANEOUS | Status: DC
  Administered 2019-08-18 – 2019-08-22 (×4): 6 via TOPICAL

## 2019-08-18 NOTE — Consult Note (Signed)
[facility] Nurse Consult Note: Reason for Consult:Patient with pinpoint pustule at coccyx Wound type:Infectious Pressure Injury POA: N/A Measurement: 0.5cm round area of erythema with pinpoint opening at apex with yellow center. Small amount of light yellow exudate on old dressing, no active exudate Wound bed:As described above Drainage (amount, consistency, odor) As described above Periwound:intact with evidence of previous healing Dressing procedure/placement/frequency:I have provided Nursing with guidance for topical care via the Orders and also for turning and repositioning (HOB at or below a 30 degree angle, turning side to side and minimizing the supine position). I have discussed this POC with the patient's bedside RN, Darylene Price.  Her assistance, and also that of Dr. Waldron Labs who provided a photo for telemedicine and uploaded it to the EMR, is appreciated.  [facility] nursing team will not follow, but will remain available to this patient, the nursing and medical teams.  Please re-consult if needed. Thanks, Maudie Flakes, MSN, RN, [place], Arther Abbott  Pager# [phone]

## 2019-08-18 NOTE — Progress Notes (Signed)
Initial Nutrition Assessment  DOCUMENTATION CODES:   Underweight, Severe malnutrition in context of chronic illness  INTERVENTION:    Boost Breeze po TID, each supplement provides 250 kcal and 9 grams of protein  MVI daily   NUTRITION DIAGNOSIS:   Severe Malnutrition related to chronic illness(colitis) as evidenced by moderate fat depletion, severe muscle depletion, energy intake < or equal to 75% for > or equal to 1 month.  GOAL:   Patient will meet greater than or equal to 90% of their needs  MONITOR:   Supplement acceptance, PO intake, Diet advancement, Weight trends, Labs, I & O's  REASON FOR ASSESSMENT:   Malnutrition Screening Tool    ASSESSMENT:   Patient with PMH significant for neurogenic bladder (chronic foley), herniated disk with cauda syndrome s/p decompression (12/2018), chronic hyponatremia, COPD, GERD, and chronic colitis. Presents this admission with acute diarrhea.   Plan endoscopy/colonscopy tomorrow.   Pt reports having a decreased appetite for 3 months PTA due to unknown reasons (likely related to chronic colitis, with off/on episodes of diarrhea) States she would eat one meal daily that consisted of [place] sandwich or PB sandwich. Drank 2 Equate shakes daily. States sometimes shakes make diarrhea worse but effects are inconsistent. Pt currently on clear liquid diet. Discussed the importance of protein intake for preservation of lean body mass. Will trial Boost breeze.   Pt endorses a UBW of 120 lb and a recent unintentional weight loss of 20 lb. Records indicate pt weighed 105 lb on 7/30 and 99 lb this admission (5.7% in 5 months, insignificant for time frame).   I/O: -707 ml since admit UOP: 500 ml x 24 hrs   Medications: ferrous sulfate  Labs: Na 129 (L) Cr 1.15-trending down   Diet Order:   Diet Order            Diet NPO time specified Except for: Sips with Meds  Diet effective midnight        Diet clear liquid Room service appropriate? Yes;  Fluid consistency: Thin  Diet effective now              EDUCATION NEEDS:   Education needs have been addressed  Skin:  Skin Assessment: Reviewed RN Assessment  Last BM:  12/16  Height:   Ht Readings from Last 1 Encounters:  08/18/19 5\' 4"  (1.626 m)    Weight:   Wt Readings from Last 1 Encounters:  08/17/19 44.9 kg    Ideal Body Weight:  54.5 kg  BMI:  Body mass index is 16.99 kg/m.  Estimated Nutritional Needs:   Kcal:  1400-1600 kcal  Protein:  70-85 grams  Fluid:  >/= 1.4 L/day   Mariana Single RD, LDN Clinical Nutrition Pager # - [phone]

## 2019-08-18 NOTE — Progress Notes (Signed)
PROGRESS NOTE                                                                                                                                                                                                             Patient Demographics:    Regina Ortiz, is a 73 y.o. female, DOB - 1945/11/21, [id number]  Admit date - 08/17/2019   Admitting Physician Lequita Halt, MD  Outpatient Primary MD for the patient is Maurice Small, MD  LOS - 1   Chief Complaint  Patient presents with  . Diarrhea       Brief Narrative    HPI: Regina Ortiz is a 73 y.o. female with medical history significant of with medical history significant ofindwelling Foley since 05/10/2019 neurogenic bladder, herniated disks L4/L5 with cauda equina syndrome s/psurgical decompression on 01/28/2019 by Dr. Ellene Route, pertension, chronic hyponatremia, rheumatoid arthritis on chronic steroid, COPD, macrocytic chronic iron deficiency anemia, and GERD, chronic colitis presented with new onset diarrhea for 3 days. Had her Foley exchanged 2 weeks ago, developed UTI catheter associated 1 week ago.  She went to her urology and was started on Augmentin for 7 days which she completed yesterday.  She started to develop diarrhea about 3 days ago, became severe since yesterday.  Had a total of 5-6 episodes today, diarrhea has been dark-colored.  Also developed cramping like abdominal pain for 3 days, her abdominal pain is localized, 5-6/10 in severity, she could not eat since this morning because the abdominal pain.  Denied any nauseous vomiting, no chest pain no short of breath no cough, or dysuria.  She had 2 episodes of C. difficile colitis in her past, she reported 2 times, and the symptoms are different compared to this time. She also had had chronic colitis, she attributed that to her chronic steroid use.  She saidl usually flareup of her chronic colitis is just loose to watery diarrhea no  dark-colored. No known Covid contacts. ED Course: C diff was sent, ED physician Dr. Melina Copa talked to GI physician who recommended Hydration and possible colonoscope.    Subjective:    Regina Ortiz today reports generalized weakness, nephrology, she had significant diarrhea this morning, dark in color .   Assessment  & Plan :    Active Problems:   Diarrhea   Acute diarrhea -Patient with history  of collagenous colitis, and C. difficile in the past . -She is Hemoccult positive . -C. difficile is negative . -Recently finished Augmentin, so induced diarrhea versus collagenous colitis . -Plan for endoscopy/colonoscopy tomorrow . -Keep on clear liquid diet .  Hyponatremia -Chronic, sodium around baseline, symptomatic.  AKI,  - likely prerenal from dehydration, improved with IV fluids  UTI versus colonization. - she just completed treatment for UTI, she did finish her Augmentin. -We will change her Foley catheter as it was not changed after her recent diagnosis with UTI.   HTN -  continue home meds with perimeters.  Rheumatoid arthritis - continue home dosage of her steroid, no indication for stressing dose.  Coccyx pressure injury -Wound care consult appreciated  Iron deficiency anemia -Hemoglobin appears to be at baseline, continue to monitor closely    COVID-19 Labs  No results for input(s): DDIMER, FERRITIN, LDH, CRP in the last 72 hours.  Lab Results  Component Value Date   Regina Ortiz NEGATIVE 08/17/2019   SARSCOV2NAA NOT DETECTED 03/17/2019   [place] NEGATIVE 03/12/2019   SARSCOV2NAA NOT DETECTED 02/15/2019     Code Status : Full  Family Communication  : none at bedside  Disposition Plan  : Home  Barriers For Discharge : remains symptomatic with diarrhea, in need of further work-up including endoscopy/colonoscopy, with close monitoring of hemoglobin.  Consults  :  GI  Procedures  : None  DVT Prophylaxis  :  SCD, hold subcu Lovenox till  GI bleed is ruled out  Lab Results  Component Value Date   PLT 236 08/17/2019    Antibiotics  :    Anti-infectives (From admission, onward)   None        Objective:   Vitals:   08/17/19 2215 08/17/19 2311 08/18/19 0417 08/18/19 1008  BP: 127/88 (!) 150/65 (!) 151/64 (!) 154/99  Pulse: 84 75 72 91  Resp: 18 16 16 18   Temp:  98.2 F (36.8 C) (!) 97.4 F (36.3 C) 98.2 F (36.8 C)  TempSrc:  Oral Oral Oral  SpO2: 100% 99% 98% 97%  Weight:  44.9 kg      Wt Readings from Last 3 Encounters:  08/17/19 44.9 kg  04/15/19 43.6 kg  04/14/19 43.6 kg     Intake/Output Summary (Last 24 hours) at 08/18/2019 1320 Last data filed at 08/18/2019 0900 Gross per 24 hour  Intake 993.36 ml  Output 800 ml  Net 193.36 ml     Physical Exam  Awake Alert, Oriented X 3, No new F.N deficits, Normal affect, frail Symmetrical Chest wall movement, Good air movement bilaterally, CTAB RRR,No Gallops,Rubs or new Murmurs, No Parasternal Heave +ve B.Sounds, Abd Soft, LLQ tenderness, No rebound - guarding or rigidity. No Cyanosis, Clubbing or edema, No new Rash or bruise     Data Review:    CBC Recent Labs  Lab 08/17/19 1504 08/17/19 1855 08/18/19 0304  WBC 9.4  --   --   HGB 9.9* 8.6* 8.2*  HCT 28.7* 24.3* 24.2*  PLT 236  --   --   MCV 107.9*  --   --   MCH 37.2*  --   --   MCHC 34.5  --   --   RDW 13.0  --   --   LYMPHSABS 0.6*  --   --   MONOABS 0.4  --   --   EOSABS 0.0  --   --   BASOSABS 0.0  --   --  Chemistries  Recent Labs  Lab 08/17/19 1504 08/18/19 0304  NA 126* 129*  K 4.4 4.9  CL 96* 102  CO2 19* 18*  GLUCOSE 134* 95  BUN 18 15  CREATININE 1.37* 1.15*  CALCIUM 9.4 8.3*  AST 23  --   ALT 26  --   ALKPHOS 41  --   BILITOT 0.5  --    ------------------------------------------------------------------------------------------------------------------ No results for input(s): CHOL, HDL, LDLCALC, TRIG, CHOLHDL, LDLDIRECT in the last 72 hours.  Lab  Results  Component Value Date   HGBA1C 5.4 09/24/2017   ------------------------------------------------------------------------------------------------------------------ No results for input(s): TSH, T4TOTAL, T3FREE, THYROIDAB in the last 72 hours.  Invalid input(s): FREET3 ------------------------------------------------------------------------------------------------------------------ No results for input(s): VITAMINB12, FOLATE, FERRITIN, TIBC, IRON, RETICCTPCT in the last 72 hours.  Coagulation profile Recent Labs  Lab 08/17/19 1504  INR 1.0    No results for input(s): DDIMER in the last 72 hours.  Cardiac Enzymes No results for input(s): CKMB, TROPONINI, MYOGLOBIN in the last 168 hours.  Invalid input(s): CK ------------------------------------------------------------------------------------------------------------------    Component Value Date/Time   BNP 238.0 (H) 02/19/2019 [id number]    Inpatient Medications  Scheduled Meds: . Chlorhexidine Gluconate Cloth  6 each Topical Daily  . diltiazem  360 mg Oral Daily  . enoxaparin (LOVENOX) injection  40 mg Subcutaneous Q24H  . ferrous sulfate  325 mg Oral Daily  . hydrALAZINE  25 mg Oral QID  . hydrocortisone  5 mg Oral QPM  . polyethylene glycol-electrolytes  4,000 mL Oral Once   Continuous Infusions: . sodium chloride 75 mL/hr at 08/18/19 1034   PRN Meds:.acetaminophen  Micro Results Recent Results (from the past 240 hour(s))  SARS CORONAVIRUS 2 (TAT 6-24 HRS) Nasopharyngeal Nasopharyngeal Swab     Status: None   Collection Time: 08/17/19  7:01 PM   Specimen: Nasopharyngeal Swab  Result Value Ref Range Status   SARS Coronavirus 2 NEGATIVE NEGATIVE Final    Comment: (NOTE) SARS-CoV-2 target nucleic acids are NOT DETECTED. The SARS-CoV-2 RNA is generally detectable in upper and lower respiratory specimens during the acute phase of infection. Negative results do not preclude SARS-CoV-2 infection, do not rule  out co-infections with other pathogens, and should not be used as the sole basis for treatment or other patient management decisions. Negative results must be combined with clinical observations, patient history, and epidemiological information. The expected result is Negative. Fact Sheet for Patients: SugarRoll.be Fact Sheet for Healthcare Providers: https://www.woods-mathews.com/ This test is not yet approved or cleared by the [place] FDA and  has been authorized for detection and/or diagnosis of SARS-CoV-2 by FDA under an Emergency Use Authorization (EUA). This EUA will remain  in effect (meaning this test can be used) for the duration of the COVID-19 declaration under Section 56 4(b)(1) of the Act, 21 U.S.C. section 360bbb-3(b)(1), unless the authorization is terminated or revoked sooner. Performed at [facility], [place] [address] [place], [place] [address]   C Difficile Quick Screen w PCR reflex     Status: None   Collection Time: 08/18/19  3:44 AM   Specimen: STOOL  Result Value Ref Range Status   C Diff antigen NEGATIVE NEGATIVE Final   C Diff toxin NEGATIVE NEGATIVE Final   C Diff interpretation No C. difficile detected.  Final    Comment: Performed at [facility], [place] [address] [place], [place] [address]    Radiology Reports DG BONE DENSITY (DXA)  Result Date: 08/16/2019 EXAM: DUAL X-RAY ABSORPTIOMETRY (DXA) FOR  BONE MINERAL DENSITY IMPRESSION: Referring Physician:  Gavin Pound Your patient completed a BMD test using Lunar IDXA DXA system ( analysis version: 16 ) manufactured by [employer]. Technologist: CG PATIENT: Name: Regina Ortiz, Regina Ortiz Patient ID: [id number] Birth Date: [birth year] Height: 61.0 in. Sex: Female Measured: 08/16/2019 Weight: 87.8 lbs. Indications: Advanced Age, Caucasian, Desyrel, Estrogen Deficient, Hysterectomy, Long term steroid use, Prednisone, Prilosec, Right hip replacement, Wellbutrin  Fractures: Rt hip Treatments: Calcium (E943.0), Fosamax, Vitamin D (E933.5) ASSESSMENT: The BMD measured at Femur Neck is 0.633 g/cm2 with a T-score of -2.9. This patient is considered osteoporotic according to [facility]) criteria. There has been a statistically significant decrease in BMD of Left hip since prior exam dated 08/12/2018. The scan quality is limited by patient condition. Lumbar spine was not utilized due to exclusion on prior exam. Right femur was excluded due to surgical hardware. Site Region Measured Date Measured Age [age]A BMD Significant CHANGE T-score Left Femur Neck 08/16/2019 74.4 [age] 0.633 g/cm2 * Left Femur Neck 08[age]12/2018 73.4 -2.2 0.718 g/cm2 Left Forearm Radius 33% 08/16/2019 74.4 -1.2 0.778 g/cm2 Left Forearm Radius 33% 08/12/2018 73.4 -1.1 0.792 g/cm2 World Health Organization [facility]) criteria for post-menopausal, Caucasian Women: Normal       T-score at or above -1 SD Osteopenia   T-score between -1 and -2.5 SD Osteoporosis T-score at or below -2.5 SD RECOMMENDATION: 1. All patients should optimize calcium and vitamin D intake. 2. Consider FDA approved medical therapies in postmenopausal women and men aged 33 years and older, based on the following: a. A hip or vertebral (clinical or morphometric) fracture b. T- score < or = -2.5 at the femoral neck or spine after appropriate evaluation to exclude secondary causes c. Low bone mass (T-score between -1.0 and -2.5 at the femoral neck or spine) and a 10 year probability of a hip fracture > or = 3% or a 10 year probability of a major osteoporosis-related fracture > or = 20% based on the [place]-adapted WHO algorithm d. Clinician judgment and/or patient preferences may indicate treatment for people with 10-year fracture probabilities above or below these levels FOLLOW-UP: Patients with diagnosis of osteoporosis or at high risk for fracture should have regular bone mineral density tests. For patients eligible for Medicare, routine  testing is allowed once every 2 years. The testing frequency can be increased to one year for patients who have rapidly progressing disease, those who are receiving or discontinuing medical therapy to restore bone mass, or have additional risk factors. [facility] Electronically Signed   By: Rolm Baptise M.D.   On: 08/16/2019 14:36     Phillips Climes M.D on 08/18/2019 at 1:20 PM  Between 7am to 7pm - Pager - [phone]  After 7pm go to [email or web address] - password [facility]  [facility] -  Office  [phone]

## 2019-08-18 NOTE — Consult Note (Addendum)
Referring Provider:  Dr. Waldron Labs Primary Care Physician:  Maurice Small, MD Primary Gastroenterologist:  Dr. Oletta Lamas (now retired)  Reason for Consultation: Diarrhea, heme positive stool  HPI: Regina Ortiz is a 73 y.o. female known to our practice because of previous problems with recurrent C. difficile colitis and a history of collagenous colitis, the latter successfully treated with cholestyramine, was admitted to the hospital yesterday because of a roughly 2-week history of diarrhea, consisting of perhaps 6 bowel movements per day, with urgency and incontinence and nocturnal diarrhea.  The patient has recently been on antibiotics, but here in the hospital, she has been noted to have a negative study for C. Difficile.  Patient's last colonoscopy was 5 years ago, performed for diarrhea, at which time the collagenous colitis was identified on biopsies..  She has no history of colon polyps.  Another issue is a mild drop in hemoglobin compared to her baseline chronic anemia (hemoglobin here is 8.2 following hydration, but as an outpatient was 11.0 on August 25, although it was 9.9 back in July).  Iron studies showed normal ferritin and slightly low iron saturation of 16%.  Her stool is Hemoccult positive on admission.  No history of exposure to aspirin or nonsteroidal anti-inflammatory drugs.  Although the patient has had some upper abdominal discomfort, her main complaint remains the diarrhea, not abdominal pain.  Past Medical History:  Diagnosis Date  . AIN grade I   . Anxiety   . Bruises easily   . Chronic diarrhea   . Chronic hyponatremia   . Colitis   . COPD (chronic obstructive pulmonary disease) ([employer])   . Depression   . GERD (gastroesophageal reflux disease)   . History of chronic bronchitis   . History of vulvar dysplasia    serveral recurrency's   . Hypertension   . LBBB (left bundle branch block) W/ PROLONGED PR   CARDIOLOGSIT-  DR Johnsie Cancel-   LOV IN EPIC  . Macrocytosis   .  Nocturia   . Osteoporosis   . Seronegative rheumatoid arthritis ([place])   . Short of breath on exertion   . Smokers' cough ([place])   . Venous insufficiency of leg    , Edema  . VIN III (vulvar intraepithelial neoplasia III)    RECURRENT -    Past Surgical History:  Procedure Laterality Date  . APPENDECTOMY  1992  . CARDIOVASCULAR STRESS TEST  06-23-2012  DR Jenkins [place]   NORMAL LEXISCAN MYOVIEW/ EF 83%/  NO ISCHEMIA  . CO2 LASER APPLICATION  [id number]   Procedure: CO2 LASER APPLICATION;  Surgeon: Janie Morning, MD PHD;  Location: [facility];  Service: Gynecology;  Laterality: N/A;  Laser Vaporization  . CO2 LASER APPLICATION N/A [id number]   Procedure: CO2 LASER APPLICATION;  Surgeon: [name], MD;  Location: [facility];  Service: Gynecology;  Laterality: N/A;  . CO2 LASER APPLICATION N/A [id number]   Procedure: CO2 LASER APPLICATION of the vulva;  Surgeon: Janie Morning, MD;  Location: [facility];  Service: Gynecology;  Laterality: N/A;  . EXCISION VULVAR LESIONS  07/2012  . HEMORRHOID SURGERY N/A 04/15/2013   Procedure: Excision of external and internal  hemorrhoid with AIN, Exam under anesthesia;  Surgeon: Odis Hollingshead, MD;  Location: [facility];  Service: General;  Laterality: N/A;  . HIP PINNING,CANNULATED Right 03/15/2019   Procedure: Maebelle Munroe OF FEMORAL NECK FRACTURE;  Surgeon: Renette Butters, MD;  Location: [place];  Service: Orthopedics;  Laterality: Right;  .  REPAIR FISTULA IN ANO/  I & D PERIRECTAL ABSCESS  10-26-2002  DR Johnathan Hausen  . TRANSTHORACIC ECHOCARDIOGRAM  06-23-2012   NORMAL LVSF/ EF 55-60%  . VAGINAL HYSTERECTOMY  1997   partial  . VULVAR LESION REMOVAL N/A 11/03/2014   Procedure: CO2 LASER OF VULVAR ;  Surgeon: [name], MD;  Location: [facility];  Service: Gynecology;  Laterality: N/A;  . VULVECTOMY N/A 03/26/2015   Procedure: WIDE LOCA  EXCISION OF VULVAR;  Surgeon: [name], MD;  Location:  [place];  Service: Gynecology;  Laterality: N/A;  . Wide Local Excision of labia majora  04-04-2010   Right-sided lesion, CO2 ablation of right labia minora    Prior to Admission medications   Medication Sig Start Date End Date Taking? Authorizing Provider  acetaminophen (TYLENOL) 500 MG tablet Take 2 tablets (1,000 mg total) by mouth every 8 (eight) hours as needed. 04/15/19  Yes Medina-Vargas, Monina C, NP  alendronate (FOSAMAX) 70 MG tablet Take 70 mg by mouth once a week. Mondays 06/28/19  Yes [provider]  busPIRone (BUSPAR) 30 MG tablet Take 30 mg by mouth 2 (two) times daily. 06/29/19  Yes [provider]  Calcium Carbonate-Vitamin D (CALTRATE 600+D PO) Take 1 tablet by mouth every evening.    Yes [provider]  Cholecalciferol (VITAMIN D3 PO) Take 1 tablet by mouth at bedtime.   Yes [provider]  diltiazem (TIAZAC) 360 MG 24 hr capsule Take 1 capsule (360 mg total) by mouth daily for 30 doses. 04/15/19 08/17/19 Yes Medina-Vargas, Monina C, NP  ferrous sulfate 325 (65 FE) MG tablet Take 1 tablet (325 mg total) by mouth daily for 30 doses. For Anemia Patient taking differently: Take 325 mg by mouth 2 (two) times daily. For Anemia  04/15/19 08/17/19 Yes Medina-Vargas, Monina C, NP  hydrALAZINE (APRESOLINE) 25 MG tablet Take 1 tablet (25 mg total) by mouth 4 (four) times daily for 120 doses. For HTN Patient taking differently: Take 50 mg by mouth 2 (two) times daily. For HTN  04/15/19 08/17/19 Yes Medina-Vargas, Monina C, NP  hydrocortisone (CORTEF) 5 MG tablet Take 1 tablet (5 mg total) by mouth every evening. DX: POSSIBLE ADRENAL INSUFFCIENCY 04/15/19  Yes Medina-Vargas, Monina C, NP  hydroxychloroquine (PLAQUENIL) 200 MG tablet Take 200 mg by mouth every evening.  08/03/19  Yes [provider]  Nutritional Supplement LIQD Take 120 mLs by mouth 2 (two) times daily as needed. [employer]    Yes [provider]  POTASSIUM  PO Take 1 tablet by mouth daily.   Yes [provider]  predniSONE (DELTASONE) 5 MG tablet Take 5 mg by mouth daily. 08/04/19  Yes [provider]  Prenatal Vit-Fe Fumarate-FA (PRENATAL VITAMIN PO) Take 1 tablet by mouth every evening.   Yes [provider]  sulfamethoxazole-trimethoprim (BACTRIM DS) 800-160 MG tablet Take 1 tablet by mouth 2 (two) times daily. 08/09/19  Yes [provider]  atorvastatin (LIPITOR) 40 MG tablet Take 1 tablet (40 mg total) by mouth daily. Patient not taking: Reported on 08/17/2019 04/15/19 08/17/19  Medina-Vargas, Monina C, NP  carvedilol (COREG) 6.25 MG tablet Take 1 tablet (6.25 mg total) by mouth 2 (two) times daily with a meal for 60 doses. Patient not taking: Reported on 08/17/2019 04/15/19 08/17/19  Medina-Vargas, Monina C, NP  enoxaparin (LOVENOX) 40 MG/0.4ML injection Inject 0.4 mLs (40 mg total) into the skin daily for 1 day. INJECT 0.4ML (40MG ) SUBCUTANEOUSLY ONCE DAILY FOR  1 MONTH FOR DVT PROPHYLAXIS Patient not taking: Reported on 08/17/2019 04/15/19 08/17/19  Medina-Vargas, Monina C, NP  methocarbamol (ROBAXIN) 500 MG tablet Take 1 tablet (500 mg total) by mouth 2 (two) times daily as needed for up to 20 doses for muscle spasms. For spasms Patient not taking: Reported on 08/17/2019 04/15/19   Medina-Vargas, Senaida Lange, NP    Current Facility-Administered Medications  Medication Dose Route Frequency Provider Last Rate Last Admin  . 0.9 %  sodium chloride infusion   Intravenous Continuous Lequita Halt, MD 75 mL/hr at 08/18/19 1034 New Bag at 08/18/19 1034  . acetaminophen (TYLENOL) tablet 1,000 mg  1,000 mg Oral Q8H PRN Wynetta Fines T, MD   1,000 mg at 08/17/19 2332  . Chlorhexidine Gluconate Cloth 2 % PADS 6 each  6 each Topical Daily Lequita Halt, MD   6 each at 08/18/19 [id number]  . diltiazem (CARDIZEM CD) 24 hr capsule 360 mg  360 mg Oral Daily Wynetta Fines T, MD   360 mg at 08/18/19 0956  . enoxaparin (LOVENOX) injection 40 mg   40 mg Subcutaneous Q24H Wynetta Fines T, MD   40 mg at 08/17/19 2056  . ferrous sulfate tablet 325 mg  325 mg Oral Daily Wynetta Fines T, MD   325 mg at 08/18/19 [id number]  . hydrALAZINE (APRESOLINE) tablet 25 mg  25 mg Oral QID Wynetta Fines T, MD   25 mg at 08/18/19 0956  . hydrocortisone (CORTEF) tablet 5 mg  5 mg Oral QPM Wynetta Fines T, MD   5 mg at 08/17/19 2053    Allergies as of 08/17/2019 - Review Complete 08/17/2019  Allergen Reaction Noted  . Latex Swelling and Other (See Comments) 03/30/2012  . Augmentin [amoxicillin-pot clavulanate] Nausea And Vomiting 01/28/2019  . Doxycycline Other (See Comments) 09/22/2013  . Lactose intolerance (gi) Other (See Comments) 07/15/2012  . Lialda [mesalamine] Diarrhea 10/22/2015  . Prinivil [lisinopril] Diarrhea 07/15/2012  . Triamcinolone acetonide Other (See Comments) 07/15/2012  . Amlodipine Rash 07/15/2012  . Lotensin [benazepril hcl] Rash 07/15/2012    Family History  Problem Relation Age of Onset  . Diabetes Mother   . Hypertension Father   . Lymphoma Son   . Cancer Son        lymphoblastic lymphoma    Social History   Socioeconomic History  . Marital status: Widowed    Spouse name: Not on file  . Number of children: Not on file  . Years of education: Not on file  . Highest education level: Not on file  Occupational History  . Not on file  Tobacco Use  . Smoking status: Current Every Day Smoker    Packs/day: 0.25    Years: 50.00    Pack years: 12.50    Types: Cigarettes  . Smokeless tobacco: Never Used  Substance and Sexual Activity  . Alcohol use: Yes    Alcohol/week: 2.0 standard drinks    Types: 2 Cans of beer per week  . Drug use: No  . Sexual activity: Yes  Other Topics Concern  . Not on file  Social History Narrative  . Not on file   Social Determinants of Health   Financial Resource Strain:   . Difficulty of Paying Living Expenses: Not on file  Food Insecurity:   . Worried About Charity fundraiser in the Last  Year: Not on file  . Ran Out of Food in the Last Year: Not on file  Transportation Needs:   .  Lack of Transportation (Medical): Not on file  . Lack of Transportation (Non-Medical): Not on file  Physical Activity:   . Days of Exercise per Week: Not on file  . Minutes of Exercise per Session: Not on file  Stress:   . Feeling of Stress : Not on file  Social Connections:   . Frequency of Communication with Friends and Family: Not on file  . Frequency of Social Gatherings with Friends and Family: Not on file  . Attends Religious Services: Not on file  . Active Member of Clubs or Organizations: Not on file  . Attends Archivist Meetings: Not on file  . Marital Status: Not on file  Intimate Partner Violence:   . Fear of Current or Ex-Partner: Not on file  . Emotionally Abused: Not on file  . Physically Abused: Not on file  . Sexually Abused: Not on file    Review of Systems: Neurogenic bladder with chronic indwelling Foley, bad skin break down in sacral area, transient left lower costal chest pain several weeks ago lasting a few days, no shortness of breath, no skin rashes, mild weight loss, ambulates with walker at home  Physical Exam: Vital signs in last 24 hours: Temp:  [97.4 F (36.3 C)-98.2 F (36.8 C)] 98.2 F (36.8 C) (12/17 1008) Pulse Rate:  [69-91] 91 (12/17 1008) Resp:  [15-24] 18 (12/17 1008) BP: (112-154)/(47-116) 154/99 (12/17 1008) SpO2:  [93 %-100 %] 97 % (12/17 1008) Weight:  [44.9 kg] 44.9 kg (12/16 2311) Last BM Date: 08/17/19  This is a very frail-appearing, pleasant, cognitively intact Caucasian female in no acute distress, eating her breakfast (small amounts).  Anicteric, no frank pallor.  Chest is clear anteriorly with reasonably good vesicular breath sounds.  Heart normal.  Abdomen without mass-effect or tenderness, but hyperactive nonobstructive bowel sounds are present; abdomen is soft and nondistended.  No peripheral edema, no obvious focal  neurologic deficits, appears to be in normal mood.  Intake/Output from previous day: 12/16 0701 - 12/17 0700 In: 873.4 [P.O.:201; I.V.:672.4] Out: 800 [Urine:500; Stool:300] Intake/Output this shift: No intake/output data recorded.  Lab Results: Recent Labs    08/17/19 1504 08/17/19 1855 08/18/19 0304  WBC 9.4  --   --   HGB 9.9* 8.6* 8.2*  HCT 28.7* 24.3* 24.2*  PLT 236  --   --    BMET Recent Labs    08/17/19 1504 08/18/19 0304  NA 126* 129*  K 4.4 4.9  CL 96* 102  CO2 19* 18*  GLUCOSE 134* 95  BUN 18 15  CREATININE 1.37* 1.15*  CALCIUM 9.4 8.3*   LFT Recent Labs    08/17/19 1504  PROT 6.5  ALBUMIN 3.6  AST 23  ALT 26  ALKPHOS 41  BILITOT 0.5   PT/INR Recent Labs    08/17/19 1504  LABPROT 12.7  INR 1.0    Studies/Results: DG BONE DENSITY (DXA)  Result Date: 08/16/2019 EXAM: DUAL X-RAY ABSORPTIOMETRY (DXA) FOR BONE MINERAL DENSITY IMPRESSION: Referring Physician:  Gavin Pound Your patient completed a BMD test using Lunar IDXA DXA system ( analysis version: 16 ) manufactured by [employer]. Technologist: CG PATIENT: Name: Regina Ortiz, Regina Ortiz Patient ID: [id number] Birth Date: February 08, 1946 Height: 61.0 in. Sex: Female Measured: 08/16/2019 Weight: 87.8 lbs. Indications: Advanced Age, Caucasian, Desyrel, Estrogen Deficient, Hysterectomy, Long term steroid use, Prednisone, Prilosec, Right hip replacement, Wellbutrin Fractures: Rt hip Treatments: Calcium (E943.0), Fosamax, Vitamin D (E933.5) ASSESSMENT: The BMD measured at Femur Neck is 0.633 g/cm2 with  a T-score of -2.9. This patient is considered osteoporotic according to [facility]) criteria. There has been a statistically significant decrease in BMD of Left hip since prior exam dated 08/12/2018. The scan quality is limited by patient condition. Lumbar spine was not utilized due to exclusion on prior exam. Right femur was excluded due to surgical hardware. Site Region Measured Date Measured Age  [age] BMD Significant CHANGE T-score Left Femur Neck 08/16/2019 74.4 -2.8 0.633 g/cm2 * Left Femur Neck 08/12/2018 73.4 -2.3 0.718 g/cm2 Left Forearm Radius 33% 08/16/2019 74.4 -1.2 0.778 g/cm2 Left Forearm Radius 33% 08/12/2018 73.4 -1.1 0.792 g/cm2 World Health Organization [facility]) criteria for post-menopausal, Caucasian Women: Normal       T-score at or above -1 SD Osteopenia   T-score between -1 and -2.5 SD Osteoporosis T-score at or below -2.5 SD RECOMMENDATION: 1. All patients should optimize calcium and vitamin D intake. 2. Consider FDA approved medical therapies in postmenopausal women and men aged 89 years and older, based on the following: a. A hip or vertebral (clinical or morphometric) fracture b. T- score < or = -2.5 at the femoral neck or spine after appropriate evaluation to exclude secondary causes c. Low bone mass (T-score between -1.0 and -2.5 at the femoral neck or spine) and a 10 year probability of a hip fracture > or = 3% or a 10 year probability of a major osteoporosis-related fracture > or = 20% based on the [place]-adapted WHO algorithm d. Clinician judgment and/or patient preferences may indicate treatment for people with 10-year fracture probabilities above or below these levels FOLLOW-UP: Patients with diagnosis of osteoporosis or at high risk for fracture should have regular bone mineral density tests. For patients eligible for Medicare, routine testing is allowed once every 2 years. The testing frequency can be increased to one year for patients who have rapidly progressing disease, those who are receiving or discontinuing medical therapy to restore bone mass, or have additional risk factors. [facility] Radiology Electronically Signed   By: Rolm Baptise M.D.   On: 08/16/2019 14:36    Impression: 1.  Diarrhea, with recent antibiotics, negative C. difficile, previous history of collagenous colitis.  Differential diagnosis includes (most likely) and activation of her collagenous colitis although  antibiotic associated diarrhea would be another possibility.  2.  Heme positive stool, anemia, equivocal iron deficiency, without obvious explanation (for example, no history of exposure to ulcerogenic medications, no polyps on colonoscopy 5 years ago).  3.  Recent mild epigastric pain, weight loss  Plan: 1.  Proceed to upper endoscopy and colonoscopy tomorrow, with random mucosal biopsies.  This will simultaneously evaluate her heme positivity and her diarrhea.  I have discussed the nature, purpose, and risks of these procedures with the patient and she is agreeable to proceed.   LOS: 1 day   Youlanda Mighty Athina Fahey  08/18/2019, 10:42 AM   Pager [phone] If no answer or after 5 PM call [phone]

## 2019-08-18 NOTE — Plan of Care (Signed)
  Problem: Activity: Goal: Risk for activity intolerance will decrease Outcome: Progressing   

## 2019-08-18 NOTE — H&P (View-Only) (Signed)
Referring Provider:  Dr. Waldron Labs Primary Care Physician:  Maurice Small, MD Primary Gastroenterologist:  Dr. Oletta Lamas (now retired)  Reason for Consultation: Diarrhea, heme positive stool  HPI: Regina Ortiz is a 73 y.o. female known to our practice because of previous problems with recurrent C. difficile colitis and a history of collagenous colitis, the latter successfully treated with cholestyramine, was admitted to the hospital yesterday because of a roughly 2-week history of diarrhea, consisting of perhaps 6 bowel movements per day, with urgency and incontinence and nocturnal diarrhea.  The patient has recently been on antibiotics, but here in the hospital, she has been noted to have a negative study for C. Difficile.  Patient's last colonoscopy was 5 years ago, performed for diarrhea, at which time the collagenous colitis was identified on biopsies..  She has no history of colon polyps.  Another issue is a mild drop in hemoglobin compared to her baseline chronic anemia (hemoglobin here is 8.2 following hydration, but as an outpatient was 11.0 on August 25, although it was 9.9 back in July).  Iron studies showed normal ferritin and slightly low iron saturation of 16%.  Her stool is Hemoccult positive on admission.  No history of exposure to aspirin or nonsteroidal anti-inflammatory drugs.  Although the patient has had some upper abdominal discomfort, her main complaint remains the diarrhea, not abdominal pain.  Past Medical History:  Diagnosis Date  . AIN grade I   . Anxiety   . Bruises easily   . Chronic diarrhea   . Chronic hyponatremia   . Colitis   . COPD (chronic obstructive pulmonary disease) ([place])   . Depression   . GERD (gastroesophageal reflux disease)   . History of chronic bronchitis   . History of vulvar dysplasia    serveral recurrency's   . Hypertension   . LBBB (left bundle branch block) W/ PROLONGED PR   CARDIOLOGSIT-  DR Johnsie Cancel-   LOV IN EPIC  . Macrocytosis   .  Nocturia   . Osteoporosis   . Seronegative rheumatoid arthritis ([place])   . Short of breath on exertion   . Smokers' cough ([place])   . Venous insufficiency of leg    , Edema  . VIN III (vulvar intraepithelial neoplasia III)    RECURRENT -    Past Surgical History:  Procedure Laterality Date  . APPENDECTOMY  1992  . CARDIOVASCULAR STRESS TEST  06-23-2012  DR Jenkins [place]   NORMAL LEXISCAN MYOVIEW/ EF 83%/  NO ISCHEMIA  . CO2 LASER APPLICATION  [id number]   Procedure: CO2 LASER APPLICATION;  Surgeon: Janie Morning, MD PHD;  Location: [facility];  Service: Gynecology;  Laterality: N/A;  Laser Vaporization  . CO2 LASER APPLICATION N/A [id number]   Procedure: CO2 LASER APPLICATION;  Surgeon: [name], MD;  Location: [facility];  Service: Gynecology;  Laterality: N/A;  . CO2 LASER APPLICATION N/A [id number]   Procedure: CO2 LASER APPLICATION of the vulva;  Surgeon: Janie Morning, MD;  Location: [facility];  Service: Gynecology;  Laterality: N/A;  . EXCISION VULVAR LESIONS  07/2012  . HEMORRHOID SURGERY N/A 04/15/2013   Procedure: Excision of external and internal  hemorrhoid with AIN, Exam under anesthesia;  Surgeon: Odis Hollingshead, MD;  Location: [facility];  Service: General;  Laterality: N/A;  . HIP PINNING,CANNULATED Right 03/15/2019   Procedure: Maebelle Munroe OF FEMORAL NECK FRACTURE;  Surgeon: Renette Butters, MD;  Location: [place];  Service: Orthopedics;  Laterality: Right;  .  REPAIR FISTULA IN ANO/  I & D PERIRECTAL ABSCESS  10-26-2002  DR Johnathan Hausen  . TRANSTHORACIC ECHOCARDIOGRAM  06-23-2012   NORMAL LVSF/ EF 55-60%  . VAGINAL HYSTERECTOMY  1997   partial  . VULVAR LESION REMOVAL N/A 11/03/2014   Procedure: CO2 LASER OF VULVAR ;  Surgeon: [name], MD;  Location: [facility];  Service: Gynecology;  Laterality: N/A;  . VULVECTOMY N/A 03/26/2015   Procedure: WIDE LOCA  EXCISION OF VULVAR;  Surgeon: [name], MD;  Location:  [place];  Service: Gynecology;  Laterality: N/A;  . Wide Local Excision of labia majora  04-04-2010   Right-sided lesion, CO2 ablation of right labia minora    Prior to Admission medications   Medication Sig Start Date End Date Taking? Authorizing Provider  acetaminophen (TYLENOL) 500 MG tablet Take 2 tablets (1,000 mg total) by mouth every 8 (eight) hours as needed. 04/15/19  Yes Medina-Vargas, Monina C, NP  alendronate (FOSAMAX) 70 MG tablet Take 70 mg by mouth once a week. Mondays 06/28/19  Yes [provider]  busPIRone (BUSPAR) 30 MG tablet Take 30 mg by mouth 2 (two) times daily. 06/29/19  Yes [provider]  Calcium Carbonate-Vitamin D (CALTRATE 600+D PO) Take 1 tablet by mouth every evening.    Yes [provider]  Cholecalciferol (VITAMIN D3 PO) Take 1 tablet by mouth at bedtime.   Yes [provider]  diltiazem (TIAZAC) 360 MG 24 hr capsule Take 1 capsule (360 mg total) by mouth daily for 30 doses. 04/15/19 08/17/19 Yes Medina-Vargas, Monina C, NP  ferrous sulfate 325 (65 FE) MG tablet Take 1 tablet (325 mg total) by mouth daily for 30 doses. For Anemia Patient taking differently: Take 325 mg by mouth 2 (two) times daily. For Anemia  04/15/19 08/17/19 Yes Medina-Vargas, Monina C, NP  hydrALAZINE (APRESOLINE) 25 MG tablet Take 1 tablet (25 mg total) by mouth 4 (four) times daily for 120 doses. For HTN Patient taking differently: Take 50 mg by mouth 2 (two) times daily. For HTN  04/15/19 08/17/19 Yes Medina-Vargas, Monina C, NP  hydrocortisone (CORTEF) 5 MG tablet Take 1 tablet (5 mg total) by mouth every evening. DX: POSSIBLE ADRENAL INSUFFCIENCY 04/15/19  Yes Medina-Vargas, Monina C, NP  hydroxychloroquine (PLAQUENIL) 200 MG tablet Take 200 mg by mouth every evening.  08/03/19  Yes [provider]  Nutritional Supplement LIQD Take 120 mLs by mouth 2 (two) times daily as needed. [employer]    Yes [provider]  POTASSIUM  PO Take 1 tablet by mouth daily.   Yes [provider]  predniSONE (DELTASONE) 5 MG tablet Take 5 mg by mouth daily. 08/04/19  Yes [provider]  Prenatal Vit-Fe Fumarate-FA (PRENATAL VITAMIN PO) Take 1 tablet by mouth every evening.   Yes [provider]  sulfamethoxazole-trimethoprim (BACTRIM DS) 800-160 MG tablet Take 1 tablet by mouth 2 (two) times daily. 08/09/19  Yes [provider]  atorvastatin (LIPITOR) 40 MG tablet Take 1 tablet (40 mg total) by mouth daily. Patient not taking: Reported on 08/17/2019 04/15/19 08/17/19  Medina-Vargas, Monina C, NP  carvedilol (COREG) 6.25 MG tablet Take 1 tablet (6.25 mg total) by mouth 2 (two) times daily with a meal for 60 doses. Patient not taking: Reported on 08/17/2019 04/15/19 08/17/19  Medina-Vargas, Monina C, NP  enoxaparin (LOVENOX) 40 MG/0.4ML injection Inject 0.4 mLs (40 mg total) into the skin daily for 1 day. INJECT 0.4ML (40MG ) SUBCUTANEOUSLY ONCE DAILY FOR  1 MONTH FOR DVT PROPHYLAXIS Patient not taking: Reported on 08/17/2019 04/15/19 08/17/19  Medina-Vargas, Monina C, NP  methocarbamol (ROBAXIN) 500 MG tablet Take 1 tablet (500 mg total) by mouth 2 (two) times daily as needed for up to 20 doses for muscle spasms. For spasms Patient not taking: Reported on 08/17/2019 04/15/19   Medina-Vargas, Senaida Lange, NP    Current Facility-Administered Medications  Medication Dose Route Frequency Provider Last Rate Last Admin  . 0.9 %  sodium chloride infusion   Intravenous Continuous Lequita Halt, MD 75 mL/hr at 08/18/19 1034 New Bag at 08/18/19 1034  . acetaminophen (TYLENOL) tablet 1,000 mg  1,000 mg Oral Q8H PRN Wynetta Fines T, MD   1,000 mg at 08/17/19 2332  . Chlorhexidine Gluconate Cloth 2 % PADS 6 each  6 each Topical Daily Lequita Halt, MD   6 each at 08/18/19 [phone]  . diltiazem (CARDIZEM CD) 24 hr capsule 360 mg  360 mg Oral Daily Wynetta Fines T, MD   360 mg at 08/18/19 0956  . enoxaparin (LOVENOX) injection 40 mg   40 mg Subcutaneous Q24H Wynetta Fines T, MD   40 mg at 08/17/19 2056  . ferrous sulfate tablet 325 mg  325 mg Oral Daily Wynetta Fines T, MD   325 mg at 08/18/19 [id number]  . hydrALAZINE (APRESOLINE) tablet 25 mg  25 mg Oral QID Wynetta Fines T, MD   25 mg at 08/18/19 0956  . hydrocortisone (CORTEF) tablet 5 mg  5 mg Oral QPM Wynetta Fines T, MD   5 mg at 08/17/19 2053    Allergies as of 08/17/2019 - Review Complete 08/17/2019  Allergen Reaction Noted  . Latex Swelling and Other (See Comments) 03/30/2012  . Augmentin [amoxicillin-pot clavulanate] Nausea And Vomiting 01/28/2019  . Doxycycline Other (See Comments) 09/22/2013  . Lactose intolerance (gi) Other (See Comments) 07/15/2012  . Lialda [mesalamine] Diarrhea 10/22/2015  . Prinivil [lisinopril] Diarrhea 07/15/2012  . Triamcinolone acetonide Other (See Comments) 07/15/2012  . Amlodipine Rash 07/15/2012  . Lotensin [benazepril hcl] Rash 07/15/2012    Family History  Problem Relation Age of Onset  . Diabetes Mother   . Hypertension Father   . Lymphoma Son   . Cancer Son        lymphoblastic lymphoma    Social History   Socioeconomic History  . Marital status: Widowed    Spouse name: Not on file  . Number of children: Not on file  . Years of education: Not on file  . Highest education level: Not on file  Occupational History  . Not on file  Tobacco Use  . Smoking status: Current Every Day Smoker    Packs/day: 0.25    Years: 50.00    Pack years: 12.50    Types: Cigarettes  . Smokeless tobacco: Never Used  Substance and Sexual Activity  . Alcohol use: Yes    Alcohol/week: 2.0 standard drinks    Types: 2 Cans of beer per week  . Drug use: No  . Sexual activity: Yes  Other Topics Concern  . Not on file  Social History Narrative  . Not on file   Social Determinants of Health   Financial Resource Strain:   . Difficulty of Paying Living Expenses: Not on file  Food Insecurity:   . Worried About Charity fundraiser in the Last  Year: Not on file  . Ran Out of Food in the Last Year: Not on file  Transportation Needs:   .  Lack of Transportation (Medical): Not on file  . Lack of Transportation (Non-Medical): Not on file  Physical Activity:   . Days of Exercise per Week: Not on file  . Minutes of Exercise per Session: Not on file  Stress:   . Feeling of Stress : Not on file  Social Connections:   . Frequency of Communication with Friends and Family: Not on file  . Frequency of Social Gatherings with Friends and Family: Not on file  . Attends Religious Services: Not on file  . Active Member of Clubs or Organizations: Not on file  . Attends Archivist Meetings: Not on file  . Marital Status: Not on file  Intimate Partner Violence:   . Fear of Current or Ex-Partner: Not on file  . Emotionally Abused: Not on file  . Physically Abused: Not on file  . Sexually Abused: Not on file    Review of Systems: Neurogenic bladder with chronic indwelling Foley, bad skin break down in sacral area, transient left lower costal chest pain several weeks ago lasting a few days, no shortness of breath, no skin rashes, mild weight loss, ambulates with walker at home  Physical Exam: Vital signs in last 24 hours: Temp:  [97.4 F (36.3 C)-98.2 F (36.8 C)] 98.2 F (36.8 C) (12/17 1008) Pulse Rate:  [69-91] 91 (12/17 1008) Resp:  [15-24] 18 (12/17 1008) BP: (112-154)/(47-116) 154/99 (12/17 1008) SpO2:  [93 %-100 %] 97 % (12/17 1008) Weight:  [44.9 kg] 44.9 kg (12/16 2311) Last BM Date: 08/17/19  This is a very frail-appearing, pleasant, cognitively intact Caucasian female in no acute distress, eating her breakfast (small amounts).  Anicteric, no frank pallor.  Chest is clear anteriorly with reasonably good vesicular breath sounds.  Heart normal.  Abdomen without mass-effect or tenderness, but hyperactive nonobstructive bowel sounds are present; abdomen is soft and nondistended.  No peripheral edema, no obvious focal  neurologic deficits, appears to be in normal mood.  Intake/Output from previous day: 12/16 0701 - 12/17 0700 In: 873.4 [P.O.:201; I.V.:672.4] Out: 800 [Urine:500; Stool:300] Intake/Output this shift: No intake/output data recorded.  Lab Results: Recent Labs    08/17/19 1504 08/17/19 1855 08/18/19 0304  WBC 9.4  --   --   HGB 9.9* 8.6* 8.2*  HCT 28.7* 24.3* 24.2*  PLT 236  --   --    BMET Recent Labs    08/17/19 1504 08/18/19 0304  NA 126* 129*  K 4.4 4.9  CL 96* 102  CO2 19* 18*  GLUCOSE 134* 95  BUN 18 15  CREATININE 1.37* 1.15*  CALCIUM 9.4 8.3*   LFT Recent Labs    08/17/19 1504  PROT 6.5  ALBUMIN 3.6  AST 23  ALT 26  ALKPHOS 41  BILITOT 0.5   PT/INR Recent Labs    08/17/19 1504  LABPROT 12.7  INR 1.0    Studies/Results: DG BONE DENSITY (DXA)  Result Date: 08/16/2019 EXAM: DUAL X-RAY ABSORPTIOMETRY (DXA) FOR BONE MINERAL DENSITY IMPRESSION: Referring Physician:  Gavin Pound Your patient completed a BMD test using Lunar IDXA DXA system ( analysis version: 16 ) manufactured by [employer]. Technologist: CG PATIENT: Name: Darrell, Jelley Patient ID: [id number] Birth Date: 10/06/1945 Height: 61.0 in. Sex: Female Measured: 08/16/2019 Weight: 87.8 lbs. Indications: Advanced Age, Caucasian, Desyrel, Estrogen Deficient, Hysterectomy, Long term steroid use, Prednisone, Prilosec, Right hip replacement, Wellbutrin Fractures: Rt hip Treatments: Calcium (E943.0), Fosamax, Vitamin D (E933.5) ASSESSMENT: The BMD measured at Femur Neck is 0.633 g/cm2 with  a T-score of -2.9. This patient is considered osteoporotic according to [facility]) criteria. There has been a statistically significant decrease in BMD of Left hip since prior exam dated 08/12/2018. The scan quality is limited by patient condition. Lumbar spine was not utilized due to exclusion on prior exam. Right femur was excluded due to surgical hardware. Site Region Measured Date Measured Age  [age] BMD Significant CHANGE T-score Left Femur Neck 08/16/2019 74.4 -2.8 0.633 g/cm2 * Left Femur Neck 08/12/2018 73.4 -2.3 0.718 g/cm2 Left Forearm Radius 33% 08/16/2019 74.4 -1.2 0.778 g/cm2 Left Forearm Radius 33% 08/12/2018 73.4 -1.1 0.792 g/cm2 World Health Organization [facility]) criteria for post-menopausal, Caucasian Women: Normal       T-score at or above -1 SD Osteopenia   T-score between -1 and -2.5 SD Osteoporosis T-score at or below -2.5 SD RECOMMENDATION: 1. All patients should optimize calcium and vitamin D intake. 2. Consider FDA approved medical therapies in postmenopausal women and men aged 62 years and older, based on the following: a. A hip or vertebral (clinical or morphometric) fracture b. T- score < or = -2.5 at the femoral neck or spine after appropriate evaluation to exclude secondary causes c. Low bone mass (T-score between -1.0 and -2.5 at the femoral neck or spine) and a 10 year probability of a hip fracture > or = 3% or a 10 year probability of a major osteoporosis-related fracture > or = 20% based on the [place]-adapted WHO algorithm d. Clinician judgment and/or patient preferences may indicate treatment for people with 10-year fracture probabilities above or below these levels FOLLOW-UP: Patients with diagnosis of osteoporosis or at high risk for fracture should have regular bone mineral density tests. For patients eligible for Medicare, routine testing is allowed once every 2 years. The testing frequency can be increased to one year for patients who have rapidly progressing disease, those who are receiving or discontinuing medical therapy to restore bone mass, or have additional risk factors. [facility] Electronically Signed   By: Rolm Baptise M.D.   On: 08/16/2019 14:36    Impression: 1.  Diarrhea, with recent antibiotics, negative C. difficile, previous history of collagenous colitis.  Differential diagnosis includes (most likely) and activation of her collagenous colitis although  antibiotic associated diarrhea would be another possibility.  2.  Heme positive stool, anemia, equivocal iron deficiency, without obvious explanation (for example, no history of exposure to ulcerogenic medications, no polyps on colonoscopy 5 years ago).  3.  Recent mild epigastric pain, weight loss  Plan: 1.  Proceed to upper endoscopy and colonoscopy tomorrow, with random mucosal biopsies.  This will simultaneously evaluate her heme positivity and her diarrhea.  I have discussed the nature, purpose, and risks of these procedures with the patient and she is agreeable to proceed.   LOS: 1 day   Youlanda Mighty Medea Deines  08/18/2019, 10:42 AM   Pager [phone] If no answer or after 5 PM call [phone]

## 2019-08-19 ENCOUNTER — Encounter (HOSPITAL_COMMUNITY): Payer: Self-pay | Admitting: Internal Medicine

## 2019-08-19 ENCOUNTER — Inpatient Hospital Stay (HOSPITAL_COMMUNITY): Payer: PPO | Admitting: Certified Registered"

## 2019-08-19 ENCOUNTER — Encounter (HOSPITAL_COMMUNITY)
Admission: EM | Disposition: A | Discharge status: Home Health | Payer: Self-pay | Source: Home / Self Care | Attending: Internal Medicine

## 2019-08-19 DIAGNOSIS — E43 Unspecified severe protein-calorie malnutrition: Secondary | ICD-10-CM | POA: Insufficient documentation

## 2019-08-19 HISTORY — PX: POLYPECTOMY: SHX5525

## 2019-08-19 HISTORY — PX: ESOPHAGOGASTRODUODENOSCOPY (EGD) WITH PROPOFOL: SHX5813

## 2019-08-19 HISTORY — PX: COLONOSCOPY WITH PROPOFOL: SHX5780

## 2019-08-19 HISTORY — PX: BIOPSY: SHX5522

## 2019-08-19 LAB — CBC
HCT: 26.5 % — ABNORMAL LOW (ref 36.0–46.0)
Hemoglobin: 9.2 g/dL — ABNORMAL LOW (ref 12.0–15.0)
MCH: 36.9 pg — ABNORMAL HIGH (ref 26.0–34.0)
MCHC: 34.7 g/dL (ref 30.0–36.0)
MCV: 106.4 fL — ABNORMAL HIGH (ref 80.0–100.0)
Platelets: 238 10*3/uL (ref 150–400)
RBC: 2.49 MIL/uL — ABNORMAL LOW (ref 3.87–5.11)
RDW: 12.7 % (ref 11.5–15.5)
WBC: 5.8 10*3/uL (ref 4.0–10.5)
nRBC: 0 % (ref 0.0–0.2)

## 2019-08-19 LAB — BASIC METABOLIC PANEL
Anion gap: 9 (ref 5–15)
BUN: 9 mg/dL (ref 8–23)
CO2: 20 mmol/L — ABNORMAL LOW (ref 22–32)
Calcium: 8.3 mg/dL — ABNORMAL LOW (ref 8.9–10.3)
Chloride: 99 mmol/L (ref 98–111)
Creatinine, Ser: 0.92 mg/dL (ref 0.44–1.00)
GFR calc Af Amer: >60 mL/min (ref >60)
GFR calc non Af Amer: >60 mL/min (ref >60)
Glucose, Bld: 80 mg/dL (ref 70–99)
Potassium: 3.7 mmol/L (ref 3.5–5.1)
Sodium: 128 mmol/L — ABNORMAL LOW (ref 135–145)

## 2019-08-19 SURGERY — ESOPHAGOGASTRODUODENOSCOPY (EGD) WITH PROPOFOL
Anesthesia: Monitor Anesthesia Care

## 2019-08-19 MED ORDER — HYDROXYCHLOROQUINE SULFATE 200 MG PO TABS
200.0000 mg | ORAL_TABLET | Freq: Every evening | ORAL | Status: DC
  Administered 2019-08-19 – 2019-08-22 (×4): 200 mg via ORAL
  Filled 2019-08-19 (×4): qty 1

## 2019-08-19 MED ORDER — VITAMIN D3 25 MCG (1000 UNIT) PO TABS
1000.0000 [IU] | ORAL_TABLET | Freq: Every day | ORAL | Status: DC
  Administered 2019-08-19 – 2019-08-22 (×4): 1000 [IU] via ORAL
  Filled 2019-08-19 (×8): qty 1

## 2019-08-19 MED ORDER — PANTOPRAZOLE SODIUM 40 MG PO TBEC
40.0000 mg | DELAYED_RELEASE_TABLET | Freq: Every day | ORAL | Status: DC
  Administered 2019-08-19 – 2019-08-23 (×5): 40 mg via ORAL
  Filled 2019-08-19 (×5): qty 1

## 2019-08-19 MED ORDER — ALENDRONATE SODIUM 70 MG PO TABS: 70.0000 mg | ORAL_TABLET | ORAL | Status: DC

## 2019-08-19 MED ORDER — SULFAMETHOXAZOLE-TRIMETHOPRIM 800-160 MG PO TABS: 1.0000 | ORAL_TABLET | Freq: Two times a day (BID) | ORAL | Status: DC

## 2019-08-19 MED ORDER — PREDNISONE 5 MG PO TABS
5.0000 mg | ORAL_TABLET | Freq: Every day | ORAL | Status: DC
  Administered 2019-08-20 – 2019-08-23 (×4): 5 mg via ORAL
  Filled 2019-08-19 (×4): qty 1

## 2019-08-19 MED ORDER — PHENYLEPHRINE HCL-NACL 10-0.9 MG/250ML-% IV SOLN
INTRAVENOUS | Status: DC | PRN
  Administered 2019-08-19: 30 ug/min via INTRAVENOUS

## 2019-08-19 MED ORDER — BUSPIRONE HCL 5 MG PO TABS
30.0000 mg | ORAL_TABLET | Freq: Two times a day (BID) | ORAL | Status: DC
  Administered 2019-08-19 – 2019-08-23 (×9): 30 mg via ORAL
  Filled 2019-08-19 (×9): qty 6

## 2019-08-19 MED ORDER — LACTATED RINGERS IV SOLN: INTRAVENOUS | Status: DC | PRN

## 2019-08-19 MED ORDER — CHOLESTYRAMINE 4 G PO PACK
4.0000 g | PACK | Freq: Two times a day (BID) | ORAL | Status: DC
  Administered 2019-08-19 – 2019-08-21 (×5): 4 g via ORAL
  Filled 2019-08-19 (×5): qty 1

## 2019-08-19 MED ORDER — PROPOFOL 500 MG/50ML IV EMUL
INTRAVENOUS | Status: DC | PRN
  Administered 2019-08-19: 100 ug/kg/min via INTRAVENOUS

## 2019-08-19 MED ORDER — METOCLOPRAMIDE HCL 5 MG/5ML PO SOLN: 10.0000 mg | Freq: Once | ORAL | Status: DC

## 2019-08-19 SURGICAL SUPPLY — 25 items

## 2019-08-19 NOTE — Anesthesia Postprocedure Evaluation (Signed)
Anesthesia Post Note  Patient: DERIAN PFOST  Procedure(s) Performed: ESOPHAGOGASTRODUODENOSCOPY (EGD) WITH PROPOFOL (N/A ) COLONOSCOPY WITH PROPOFOL (N/A ) BIOPSY POLYPECTOMY     Patient location during evaluation: Endoscopy Anesthesia Type: MAC Level of consciousness: awake and alert Pain management: pain level controlled Vital Signs Assessment: post-procedure vital signs reviewed and stable Respiratory status: spontaneous breathing, nonlabored ventilation and respiratory function stable Cardiovascular status: stable and blood pressure returned to baseline Postop Assessment: no apparent nausea or vomiting Anesthetic complications: no    Last Vitals:  Vitals:   08/19/19 1050 08/19/19 1121  BP: (!) 165/54 (!) 153/86  Pulse: 69 71  Resp: (!) 24 18  Temp:  36.6 C  SpO2: 100% 96%    Last Pain:  Vitals:   08/19/19 1121  TempSrc: Oral  PainSc:                  Catalina Gravel

## 2019-08-19 NOTE — Progress Notes (Signed)
PT Cancellation Note  Patient Details Name: Regina Ortiz MRN: [id number] DOB: 02/15/1946   Cancelled Treatment:    Reason Eval/Treat Not Completed: Patient at procedure or test/unavailable;Medical issues which prohibited therapy.  Pt was in her EGD then was feeling lethargic afterward.  Will retry at another time.   Ramond Dial 08/19/2019, 12:18 PM   Mee Hives, PT MS Acute Rehab Dept. Number: [place]

## 2019-08-19 NOTE — Progress Notes (Signed)
PROGRESS NOTE                                                                                                                                                                                                             Patient Demographics:    Regina Ortiz, is a 73 y.o. female, DOB - [birth year], [id number]  Admit date - 08/17/2019   Admitting Physician Lequita Halt, MD  Outpatient Primary MD for the patient is Maurice Small, MD  LOS - 2   Chief Complaint  Patient presents with  . Diarrhea       Brief Narrative    73 y.o. female with medical history significant of with medical history significant ofindwelling Foley since 05/10/2019 neurogenic bladder, herniated disks L4/L5 with cauda equina syndrome s/psurgical decompression on 01/28/2019 by Dr. Ellene Route, pertension, chronic hyponatremia, rheumatoid arthritis on chronic steroid, COPD, macrocytic chronic iron deficiency anemia, and GERD, chronic colitis presented with new onset diarrhea for 3 days. Had her Foley exchanged 2 weeks ago, developed UTI catheter associated 1 week ago.  She went to her urology and was started on Augmentin for 7 days which she completed yesterday.  She started to develop diarrhea about 3 days ago, became severe since yesterday.  Had a total of 5-6 episodes today, diarrhea has been dark-colored.  Also developed cramping like abdominal pain for 3 days, her abdominal pain is localized, 5-6/10 in severity, she could not eat since this morning because the abdominal pain.  Denied any nauseous vomiting, no chest pain no short of breath no cough, or dysuria.  She had 2 episodes of C. difficile colitis in her past, she reported 2 times, and the symptoms are different compared to this time. She also had had chronic colitis, she attributed that to her chronic steroid use.  She saidl usually flareup of her chronic colitis is just loose to watery diarrhea no dark-colored. Patient C. difficile  is negative, he went for endoscopy and colonoscopy 12/18, globin remained stable during hospital stay.    Subjective:    Crystaline Edstrom today reports generalized weakness, still reports some diarrhea from her bowel prep yesterday, but it is clear in color .   Assessment  & Plan :    Active Problems:   Diarrhea   Protein-calorie malnutrition, severe   Acute diarrhea -Patient with history of collagenous  colitis, and C. difficile in the past . -She is Hemoccult positive , no evidence of GI bleed on endoscopy/colonoscopy, Hemoccult positive most likely related to iron, H&H is stable. -C. difficile is negative . -Recently finished Augmentin, so induced antibiotic induced diarrhea versus collagenous colitis . -Tolerating clear liquid diet, will advance as tolerated. -Status post endoscopy, significant for gastritis, biopsies were taken, rule out H. pylori, follow on vaginal biopsy to rule out celiac disease.  She is on PPI. -Colonoscopy with finding of polyps, which were removed, biopsies were sent as well.  Hyponatremia -Chronic, sodium around baseline, symptomatic.  AKI,  - likely prerenal from dehydration, resolved with IV fluid, back to baseline  UTI versus colonization. - she just completed treatment for UTI, she did finish her Augmentin. -Did change her Foley catheter given it was not changed after her recent diagnosis of UTI.   HTN -  continue home meds with perimeters.  Rheumatoid arthritis - continue home dosage of her steroid, no indication for stressing dose.  She is on both Solu-Cortef and prednisone  Coccyx pressure injury -Wound care consult appreciated  Iron deficiency anemia -Hemoglobin appears to be at baseline, continue to monitor closely, continue with iron supplements  Severe protein calorie malnutrition -Nutritionist consult greatly appreciated, and malnutrition in the setting of chronic illness related to colitis, continue supplements    COVID-19  Labs  No results for input(s): DDIMER, FERRITIN, LDH, CRP in the last 72 hours.  Lab Results  Component Value Date   [place] NEGATIVE 08/17/2019   SARSCOV2NAA NOT DETECTED 03/17/2019   [place] NEGATIVE 03/12/2019   SARSCOV2NAA NOT DETECTED 02/15/2019     Code Status : Full  Family Communication  : none at bedside  Disposition Plan  : Home  Barriers For Discharge : remains symptomatic with diarrhea, in need of further work-up including endoscopy/colonoscopy, with close monitoring of hemoglobin.  Consults  :  GI  Procedures  : None  DVT Prophylaxis  :  SCD, hold subcu Lovenox till GI bleed is ruled out  Lab Results  Component Value Date   PLT 238 08/19/2019    Antibiotics  :    Anti-infectives (From admission, onward)   Start     Dose/Rate Route Frequency Ordered Stop   08/19/19 1800  hydroxychloroquine (PLAQUENIL) tablet 200 mg     200 mg Oral Every evening 08/19/19 1122     08/19/19 1130  sulfamethoxazole-trimethoprim (BACTRIM DS) 800-160 MG per tablet 1 tablet  Status:  Discontinued     1 tablet Oral 2 times daily 08/19/19 1122 08/19/19 1147        Objective:   Vitals:   08/19/19 1037 08/19/19 1040 08/19/19 1050 08/19/19 1121  BP: (!) 153/58 (!) 153/58 (!) 165/54 (!) 153/86  Pulse: 70 76 69 71  Resp: (!) 21 19 (!) 24 18  Temp: 97.8 F (36.6 C)   97.9 F (36.6 C)  TempSrc: Oral   Oral  SpO2: 100% 99% 100% 96%  Weight:      Height:        Wt Readings from Last 3 Encounters:  08/17/19 44.9 kg  04/15/19 43.6 kg  04/14/19 43.6 kg     Intake/Output Summary (Last 24 hours) at 08/19/2019 1422 Last data filed at 08/19/2019 1035 Gross per 24 hour  Intake 500 ml  Output 2850 ml  Net -2350 ml     Physical Exam  Awake Alert, Oriented X 3, No new F.N deficits, Normal affect, frail Symmetrical Chest wall movement,  Good air movement bilaterally, CTAB RRR,No Gallops,Rubs or new Murmurs, No Parasternal Heave +ve B.Sounds, Abd Soft,  No rebound -  guarding or rigidity. No Cyanosis, Clubbing or edema, No new Rash or bruise       Data Review:    CBC Recent Labs  Lab 08/17/19 1504 08/17/19 1855 08/18/19 0304 08/18/19 1411 08/19/19 0552  WBC 9.4  --   --   --  5.8  HGB 9.9* 8.6* 8.2* 9.9* 9.2*  HCT 28.7* 24.3* 24.2* 27.7* 26.5*  PLT 236  --   --   --  238  MCV 107.9*  --   --   --  106.4*  MCH 37.2*  --   --   --  36.9*  MCHC 34.5  --   --   --  34.7  RDW 13.0  --   --   --  12.7  LYMPHSABS 0.6*  --   --   --   --   MONOABS 0.4  --   --   --   --   EOSABS 0.0  --   --   --   --   BASOSABS 0.0  --   --   --   --     Chemistries  Recent Labs  Lab 08/17/19 1504 08/18/19 0304 08/19/19 0552  NA 126* 129* 128*  K 4.4 4.9 3.7  CL 96* 102 99  CO2 19* 18* 20*  GLUCOSE 134* 95 80  BUN 18 15 9   CREATININE 1.37* 1.15* 0.92  CALCIUM 9.4 8.3* 8.3*  AST 23  --   --   ALT 26  --   --   ALKPHOS 41  --   --   BILITOT 0.5  --   --    ------------------------------------------------------------------------------------------------------------------ No results for input(s): CHOL, HDL, LDLCALC, TRIG, CHOLHDL, LDLDIRECT in the last 72 hours.  Lab Results  Component Value Date   HGBA1C 5.4 09/24/2017   ------------------------------------------------------------------------------------------------------------------ No results for input(s): TSH, T4TOTAL, T3FREE, THYROIDAB in the last 72 hours.  Invalid input(s): FREET3 ------------------------------------------------------------------------------------------------------------------ No results for input(s): VITAMINB12, FOLATE, FERRITIN, TIBC, IRON, RETICCTPCT in the last 72 hours.  Coagulation profile Recent Labs  Lab 08/17/19 1504  INR 1.0    No results for input(s): DDIMER in the last 72 hours.  Cardiac Enzymes No results for input(s): CKMB, TROPONINI, MYOGLOBIN in the last 168 hours.  Invalid input(s):  CK ------------------------------------------------------------------------------------------------------------------    Component Value Date/Time   BNP 238.0 (H) 02/19/2019 [id number]    Inpatient Medications  Scheduled Meds: . busPIRone  30 mg Oral BID  . Chlorhexidine Gluconate Cloth  6 each Topical Daily  . cholecalciferol  1,000 Units Oral QHS  . cholestyramine  4 g Oral BID  . diltiazem  360 mg Oral Daily  . feeding supplement  1 Container Oral TID BM  . ferrous sulfate  325 mg Oral Daily  . hydrALAZINE  25 mg Oral QID  . hydrocortisone  5 mg Oral QPM  . hydroxychloroquine  200 mg Oral QPM  . multivitamin with minerals  1 tablet Oral Daily  . pantoprazole  40 mg Oral Daily  . predniSONE  5 mg Oral Q breakfast   Continuous Infusions:  PRN Meds:.acetaminophen  Micro Results Recent Results (from the past 240 hour(s))  SARS CORONAVIRUS 2 (TAT 6-24 HRS) Nasopharyngeal Nasopharyngeal Swab     Status: None   Collection Time: 08/17/19  7:01 PM   Specimen: Nasopharyngeal Swab  Result Value Ref Range Status  SARS Coronavirus 2 NEGATIVE NEGATIVE Final    Comment: (NOTE) SARS-CoV-2 target nucleic acids are NOT DETECTED. The SARS-CoV-2 RNA is generally detectable in upper and lower respiratory specimens during the acute phase of infection. Negative results do not preclude SARS-CoV-2 infection, do not rule out co-infections with other pathogens, and should not be used as the sole basis for treatment or other patient management decisions. Negative results must be combined with clinical observations, patient history, and epidemiological information. The expected result is Negative. Fact Sheet for Patients: SugarRoll.be Fact Sheet for Healthcare Providers: https://www.woods-mathews.com/ This test is not yet approved or cleared by the [place] FDA and  has been authorized for detection and/or diagnosis of SARS-CoV-2 by FDA under an  Emergency Use Authorization (EUA). This EUA will remain  in effect (meaning this test can be used) for the duration of the COVID-19 declaration under Section 56 4(b)(1) of the Act, 21 U.S.C. section 360bbb-3(b)(1), unless the authorization is terminated or revoked sooner. Performed at [facility], [place] [address], [place], [place] [address]   C Difficile Quick Screen w PCR reflex     Status: None   Collection Time: 08/18/19  3:44 AM   Specimen: STOOL  Result Value Ref Range Status   C Diff antigen NEGATIVE NEGATIVE Final   C Diff toxin NEGATIVE NEGATIVE Final   C Diff interpretation No C. difficile detected.  Final    Comment: Performed at [facility], [place] [address], [place], [place] [address]    Radiology Reports DG BONE DENSITY (DXA)  Result Date: 08/16/2019 EXAM: DUAL X-RAY ABSORPTIOMETRY (DXA) FOR BONE MINERAL DENSITY IMPRESSION: Referring Physician:  Gavin Pound Your patient completed a BMD test using Lunar IDXA DXA system ( analysis version: 16 ) manufactured by [employer]. Technologist: CG PATIENT: Name: Carlyne, Hrabe Patient ID: [id number] Birth Date: [birth year] Height: 61.0 in. Sex: Female Measured: 08/16/2019 Weight: 87.8 lbs. Indications: Advanced Age, Caucasian, Desyrel, Estrogen Deficient, Hysterectomy, Long term steroid use, Prednisone, Prilosec, Right hip replacement, Wellbutrin Fractures: Rt hip Treatments: Calcium (E943.0), Fosamax, Vitamin D (E933.5) ASSESSMENT: The BMD measured at Femur Neck is 0.633 g/cm2 with a T-score of -2.9. This patient is considered osteoporotic according to [facility]) criteria. There has been a statistically significant decrease in BMD of Left hip since prior exam dated 08/12/2018. The scan quality is limited by patient condition. Lumbar spine was not utilized due to exclusion on prior exam. Right femur was excluded due to surgical hardware. Site Region Measured Date Measured Age [age]A BMD Significant CHANGE  T-score Left Femur Neck 08/16/2019 74.4 [age]2.8 0.633 g/cm2 * Left Femur Neck 08/12/2018 73.4 -2.2 0.718 g/cm2 Left Forearm Radius 33% 08/16/2019 74.4 -1.2 0.778 g/cm2 Left Forearm Radius 33% 08/12/2018 73.4 -1.1 0.792 g/cm2 World Health Organization [facility]) criteria for post-menopausal, Caucasian Women: Normal       T-score at or above -1 SD Osteopenia   T-score between -1 and -2.5 SD Osteoporosis T-score at or below -2.5 SD RECOMMENDATION: 1. All patients should optimize calcium and vitamin D intake. 2. Consider FDA approved medical therapies in postmenopausal women and men aged 57 years and older, based on the following: a. A hip or vertebral (clinical or morphometric) fracture b. T- score < or = -2.5 at the femoral neck or spine after appropriate evaluation to exclude secondary causes c. Low bone mass (T-score between -1.0 and -2.5 at the femoral neck or spine) and a 10 year probability of a hip fracture > or =  3% or a 10 year probability of a major osteoporosis-related fracture > or = 20% based on the [place]-adapted WHO algorithm d. Clinician judgment and/or patient preferences may indicate treatment for people with 10-year fracture probabilities above or below these levels FOLLOW-UP: Patients with diagnosis of osteoporosis or at high risk for fracture should have regular bone mineral density tests. For patients eligible for Medicare, routine testing is allowed once every 2 years. The testing frequency can be increased to one year for patients who have rapidly progressing disease, those who are receiving or discontinuing medical therapy to restore bone mass, or have additional risk factors. [facility] Radiology Electronically Signed   By: Rolm Baptise M.D.   On: 08/16/2019 14:36     Phillips Climes M.D on 08/19/2019 at 2:22 PM  Between 7am to 7pm - Pager - [phone]  After 7pm go to [email or web address] - password [facility]  [facility] -  Office  [phone]

## 2019-08-19 NOTE — Plan of Care (Signed)
  Problem: Clinical Measurements: Goal: Ability to maintain clinical measurements within normal limits will improve Outcome: Progressing   

## 2019-08-19 NOTE — Anesthesia Preprocedure Evaluation (Addendum)
Anesthesia Evaluation  Patient identified by MRN, date of birth, ID band Patient awake    Reviewed: Allergy & Precautions, NPO status , Patient's Chart, lab work & pertinent test results, reviewed documented beta blocker date and time   Airway Mallampati: II  TM Distance: >3 FB Neck ROM: Full    Dental  (+) Dental Advisory Given, Upper Dentures   Pulmonary COPD, Patient abstained from smoking., former smoker,    Pulmonary exam normal breath sounds clear to auscultation       Cardiovascular hypertension, Pt. on home beta blockers (-) angina(-) Past MI Normal cardiovascular exam+ dysrhythmias (LBBB)  Rhythm:Regular Rate:Normal     Neuro/Psych  Headaches, PSYCHIATRIC DISORDERS Anxiety Depression  Neuromuscular disease    GI/Hepatic Neg liver ROS, GERD  ,  Endo/Other  negative endocrine ROS  Renal/GU negative Renal ROS     Musculoskeletal  (+) Arthritis , Rheumatoid disorders,    Abdominal   Peds  Hematology  (+) Blood dyscrasia (Lovenox), anemia ,   Anesthesia Other Findings Day of surgery medications reviewed with the patient.  Reproductive/Obstetrics                            Anesthesia Physical Anesthesia Plan  ASA: III  Anesthesia Plan: MAC   Post-op Pain Management:    Induction: Intravenous  PONV Risk Score and Plan: 1 and Propofol infusion and Treatment may vary due to age or medical condition  Airway Management Planned: Nasal Cannula and Natural Airway  Additional Equipment:   Intra-op Plan:   Post-operative Plan:   Informed Consent: I have reviewed the patients History and Physical, chart, labs and discussed the procedure including the risks, benefits and alternatives for the proposed anesthesia with the patient or authorized representative who has indicated his/her understanding and acceptance.     Dental advisory given  Plan Discussed with: CRNA and  Anesthesiologist  Anesthesia Plan Comments:         Anesthesia Quick Evaluation

## 2019-08-19 NOTE — Op Note (Signed)
EGD was performed for epigastric abdominal pain, anemia, FOBT positive stool, diarrhea and weight loss..   Findings: Normal esophagus, widely patent Schatzki's ring at GE junction, small hiatal hernia. Multiple dispersed small erosions in the gastric body, incisura antrum and pylorus. Moderate antral gastritis, biopsies taken for H. Pylori. Normal cardia and fundus on retroflexion. Normal-appearing duodenal bulb and duodenum, biopsies taken for celiac disease.   Colonoscopy was performed for chronic diarrhea, heme positive stool, history of collagenous colitis, last colonoscopy 5 years ago.  Findings: 2 polyps in the transverse colon, removed with cold biopsy forceps and hot snare polypectomy. 2 polyps in descending colon removed with hot snare and cold biopsy forceps. Few small diverticulosis in sigmoid and descending. Biopsies taken from right colon and left colon for evaluation for microscopic colitis. Small internal hemorrhoids.  Recommendation: Low fiber diet. Cholestyramine twice daily. May need budesonide if diarrhea is ongoing. Pathology may be followed as outpatient.   [name], MD

## 2019-08-19 NOTE — Op Note (Signed)
[facility] Patient Name: Regina Ortiz Procedure Date : 08/19/2019 MRN: [id number] Attending MD: [name] , MD Date of Birth: 07/08/1946 CSN: [id number] Age: 73 Admit Type: Inpatient Procedure:                Colonoscopy Indications:              Last colonoscopy 5 years ago, Chronic diarrhea,                            Heme positive stool, history of collagenous colitis Providers:                [name], MD, Baird Cancer, RN, Lina Sar,                            Technician, Zorita Pang CRNA, CRNA Referring MD:              Medicines:                Monitored Anesthesia Care Complications:            No immediate complications. Estimated blood loss:                            Minimal. Estimated Blood Loss:     Estimated blood loss was minimal. Procedure:                Pre-Anesthesia Assessment:                           - Prior to the procedure, a History and Physical                            was performed, and patient medications and                            allergies were reviewed. The patient's tolerance of                            previous anesthesia was also reviewed. The risks                            and benefits of the procedure and the sedation                            options and risks were discussed with the patient.                            All questions were answered, and informed consent                            was obtained. Prior Anticoagulants: The patient has                            taken no previous anticoagulant or antiplatelet  agents. ASA Grade Assessment: III - A patient with                            severe systemic disease. After reviewing the risks                            and benefits, the patient was deemed in                            satisfactory condition to undergo the procedure.                           - Prior to the procedure, a History and Physical                             was performed, and patient medications and                            allergies were reviewed. The patient's tolerance of                            previous anesthesia was also reviewed. The risks                            and benefits of the procedure and the sedation                            options and risks were discussed with the patient.                            All questions were answered, and informed consent                            was obtained. Prior Anticoagulants: The patient has                            taken no previous anticoagulant or antiplatelet                            agents. ASA Grade Assessment: III - A patient with                            severe systemic disease. After reviewing the risks                            and benefits, the patient was deemed in                            satisfactory condition to undergo the procedure.                           After obtaining informed consent, the colonoscope  was passed under direct vision. Throughout the                            procedure, the patient's blood pressure, pulse, and                            oxygen saturations were monitored continuously. The                            [id number]-PH190L [id number]) Olympus ultra slim colonoscope                            was introduced through the anus and advanced to the                            the cecum, identified by appendiceal orifice and                            ileocecal valve. The patient tolerated the                            procedure well. The quality of the bowel                            preparation was fair. The colonoscopy was                            technically difficult and complex due to a                            redundant colon and significant looping. Successful                            completion of the procedure was aided by applying                            abdominal pressure. An ultrathin  colonsocope was                            used for the procedure from the beginning due to                            patient's small size and low weight. Scope In: 9:49:16 AM Scope Out: 10:24:05 AM Scope Withdrawal Time: 0 hours 22 minutes 10 seconds  Total Procedure Duration: 0 hours 34 minutes 49 seconds  Findings:      The perianal and digital rectal examinations were normal.      Two sessile polyps were found in the transverse colon. The polyps were 4       to 9 mm in size. These polyps were removed with a hot snare and cold       biopsy forceps. Resection and retrieval were complete.      Two sessile polyps were found in the descending colon. The polyps were 4  to 9 mm in size. These polyps were removed with a hot snare and cold       biopsy forceps. Resection and retrieval were complete.      A few small-mouthed diverticula were found in the sigmoid colon and       descending colon.      A localized area of mildly erythematous, inflamed and nodular mucosa was       found in the cecum. Biopsies were taken with a cold forceps for       histology labelled as right random colon biopsies.      Biopsies for histology were taken with a cold forceps from the left       colon for evaluation of microscopic colitis.      Non-bleeding internal hemorrhoids were found during retroflexion. The       hemorrhoids were small. Impression:               - Preparation of the colon was fair.                           - Two 4 to 9 mm polyps in the transverse colon,                            removed with a hot snare. Resected and retrieved.                           - Two 4 to 9 mm polyps in the descending colon,                            removed with a hot snare. Resected and retrieved.                           - Diverticulosis in the sigmoid colon and in the                            descending colon.                           - Erythematous, inflamed and nodular mucosa in the                             cecum. Biopsied.                           - Non-bleeding internal hemorrhoids.                           - Biopsies were taken with a cold forceps from the                            left colon for evaluation of microscopic colitis. Moderate Sedation:      Patient did not receive moderate sedation for this procedure, but       instead received monitored anesthesia care. Recommendation:           - Low fiber diet.                           -  Continue present medications.                           - Await pathology results.                           - Repeat colonoscopy for surveillance based on                            pathology results. Procedure Code(s):        --- Professional ---                           [phone], Colonoscopy, flexible; with removal of                            tumor(s), polyp(s), or other lesion(s) by snare                            technique                           [id number], 4, Colonoscopy, flexible; with biopsy,                            single or multiple Diagnosis Code(s):        --- Professional ---                           K64.8, Other hemorrhoids                           K63.5, Polyp of colon                           K52.9, Noninfective gastroenteritis and colitis,                            unspecified                           K63.89, Other specified diseases of intestine                           R19.5, Other fecal abnormalities                           K57.30, Diverticulosis of large intestine without                            perforation or abscess without bleeding CPT copyright 2019 American Medical Association. All rights reserved. The codes documented in this report are preliminary and upon coder review may  be revised to meet current compliance requirements. [name], MD 08/19/2019 10:38:03 AM This report has been signed electronically. Number of Addenda: 0

## 2019-08-19 NOTE — Interval H&P Note (Signed)
History and Physical Interval Note: 73/female with diarrhea and FOBT positive stools, history of collagenous colitis, mild epigastric pain and weight loss for EGD and colonoscopy.  08/19/2019 9:01 AM  Regina Ortiz  has presented today for EGD and colonoscopy, with the diagnosis of heme positive stool and diarrhea.  The various methods of treatment have been discussed with the patient and family. After consideration of risks, benefits and other options for treatment, the patient has consented to  Procedure(s): ESOPHAGOGASTRODUODENOSCOPY (EGD) WITH PROPOFOL (N/A) COLONOSCOPY WITH PROPOFOL (N/A) as a surgical intervention.  The patient's history has been reviewed, patient examined, no change in status, stable for surgery.  I have reviewed the patient's chart and labs.  Questions were answered to the patient's satisfaction.     [name]

## 2019-08-19 NOTE — Brief Op Note (Signed)
08/17/2019 - 08/19/2019  10:38 AM  PATIENT:  M[place] Manners  73 y.o. female  PRE-OPERATIVE DIAGNOSIS:  heme positive stool and diarrhea  POST-OPERATIVE DIAGNOSIS:  EGD- gastritis, gastric erosions, hiatal hernia, biopsies taken at antrum r/o Hpylori and duodenal biopsy r/o celiac disease.  Colon- transverse colon polyps removed, random right and left colon biopsy,descending colon polyp removed  PROCEDURE:  Procedure(s): ESOPHAGOGASTRODUODENOSCOPY (EGD) WITH PROPOFOL (N/A) COLONOSCOPY WITH PROPOFOL (N/A) BIOPSY POLYPECTOMY  SURGEON:  Surgeon(s) and Role:    [name], MD - Primary  PHYSICIAN ASSISTANT:   ASSISTANTS: Lynett Fish, Drenda Freeze   ANESTHESIA:   MAC  EBL:  Minimal  BLOOD ADMINISTERED:none  DRAINS: none   LOCAL MEDICATIONS USED:  NONE  SPECIMEN:  Biopsy / Limited Resection  DISPOSITION OF SPECIMEN:  PATHOLOGY  COUNTS:  YES  TOURNIQUET:  * No tourniquets in log *  DICTATION: .Dragon Dictation  PLAN OF CARE: Admit to inpatient   PATIENT DISPOSITION:  PACU - hemodynamically stable.   Delay start of Pharmacological VTE agent (>24hrs) due to surgical blood loss or risk of bleeding: not applicable

## 2019-08-19 NOTE — Progress Notes (Signed)
PT Cancellation Note  Patient Details Name: Regina Ortiz MRN: [id number] DOB: 1946-07-15   Cancelled Treatment:    Reason Eval/Treat Not Completed: Patient at procedure or test/unavailable;Medical issues which prohibited therapy.  Made three attempts and will reattempt at another time.   Ramond Dial 08/19/2019, 3:32 PM   Mee Hives, PT MS Acute Rehab Dept. Number: [place] and [place]

## 2019-08-19 NOTE — Op Note (Addendum)
[facility] Patient Name: Regina Ortiz Procedure Date : 08/19/2019 MRN: [id number] Attending MD: [name] , MD Date of Birth: 06-06-1946 CSN: [id number] Age: 73 Admit Type: Inpatient Procedure:                Upper GI endoscopy Indications:              Epigastric abdominal pain, Unexplained iron                            deficiency anemia, Heme positive stool, Diarrhea,                            Weight loss Providers:                [name], MD, Baird Cancer, RN, Lina Sar,                            Technician, Zorita Pang CRNA, CRNA Referring MD:             [facility] Medicines:                Monitored Anesthesia Care Complications:            No immediate complications. Estimated blood loss:                            Minimal. Estimated Blood Loss:     Estimated blood loss was minimal. Procedure:                Pre-Anesthesia Assessment:                           - Prior to the procedure, a History and Physical                            was performed, and patient medications and                            allergies were reviewed. The patient's tolerance of                            previous anesthesia was also reviewed. The risks                            and benefits of the procedure and the sedation                            options and risks were discussed with the patient.                            All questions were answered, and informed consent                            was obtained. Prior Anticoagulants: The patient has  taken no previous anticoagulant or antiplatelet                            agents. ASA Grade Assessment: III - A patient with                            severe systemic disease. After reviewing the risks                            and benefits, the patient was deemed in                            satisfactory condition to undergo the procedure.                           After obtaining  informed consent, the endoscope was                            passed under direct vision. Throughout the                            procedure, the patient's blood pressure, pulse, and                            oxygen saturations were monitored continuously. The                            [id number]-PH190L [id number]) Olympus ultra slim colonoscope                            was introduced through the mouth, and advanced to                            the second part of duodenum. The upper GI endoscopy                            was accomplished without difficulty. The patient                            tolerated the procedure well. Scope In: Scope Out: Findings:      A widely patent Schatzki ring was found at the gastroesophageal junction.      The examined esophagus was normal.      A small hiatal hernia was present.      Multiple dispersed small erosions with stigmata of recent bleeding were       found in the gastric body, at the incisura, in the gastric antrum and at       the pylorus.      Localized moderately erythematous mucosa without bleeding was found in       the gastric antrum. Biopsies were taken with a cold forceps for       Helicobacter pylori testing.      The cardia and gastric fundus were normal on retroflexion.      The examined duodenum was normal. Biopsies for histology were taken with  a cold forceps for evaluation of celiac disease. Impression:               - Widely patent Schatzki ring.                           - Normal esophagus.                           - Small hiatal hernia.                           - Erosive gastropathy with stigmata of recent                            bleeding.                           - Erythematous mucosa in the antrum. Biopsied.                           - Normal examined duodenum. Biopsied. Moderate Sedation:      Patient did not receive moderate sedation for this procedure, but       instead received monitored anesthesia  care. Recommendation:           - Low fiber diet.                           - Use Protonix (pantoprazole) 40 mg PO daily.                           - Await pathology results.                           - Perform a colonoscopy today. Procedure Code(s):        --- Professional ---                           [phone], Esophagogastroduodenoscopy, flexible,                            transoral; with biopsy, single or multiple Diagnosis Code(s):        --- Professional ---                           K22.2, Esophageal obstruction                           K44.9, Diaphragmatic hernia without obstruction or                            gangrene                           K92.2, Gastrointestinal hemorrhage, unspecified                           K31.89, Other diseases of stomach and duodenum  R10.13, Epigastric pain                           D50.9, Iron deficiency anemia, unspecified                           R19.5, Other fecal abnormalities                           R19.7, Diarrhea, unspecified                           R63.4, Abnormal weight loss CPT copyright 2019 American Medical Association. All rights reserved. The codes documented in this report are preliminary and upon coder review may  be revised to meet current compliance requirements. [name], MD 08/19/2019 10:31:54 AM This report has been signed electronically. Number of Addenda: 0

## 2019-08-19 NOTE — Transfer of Care (Signed)
Immediate Anesthesia Transfer of Care Note  Patient: Regina Ortiz  Procedure(s) Performed: ESOPHAGOGASTRODUODENOSCOPY (EGD) WITH PROPOFOL (N/A ) COLONOSCOPY WITH PROPOFOL (N/A ) BIOPSY POLYPECTOMY  Patient Location: Endoscopy Unit  Anesthesia Type:MAC  Level of Consciousness: awake, alert  and oriented  Airway & Oxygen Therapy: Patient Spontanous Breathing  Post-op Assessment: Report given to RN and Post -op Vital signs reviewed and stable  Post vital signs: Reviewed and stable  Last Vitals:  Vitals Value Taken Time  BP 153/58 1038  Temp    Pulse 68 1038  Resp 16 1038  SpO2 100 1038    Last Pain:  Vitals:   08/19/19 0954  TempSrc:   PainSc: 2       Patients Stated Pain Goal: 0 (56/25/63 8937)  Complications: No apparent anesthesia complications

## 2019-08-20 ENCOUNTER — Inpatient Hospital Stay (HOSPITAL_COMMUNITY): Payer: PPO

## 2019-08-20 DIAGNOSIS — R338 Other retention of urine: Secondary | ICD-10-CM

## 2019-08-20 LAB — CBC
HCT: 26.8 % — ABNORMAL LOW (ref 36.0–46.0)
Hemoglobin: 9.8 g/dL — ABNORMAL LOW (ref 12.0–15.0)
MCH: 37 pg — ABNORMAL HIGH (ref 26.0–34.0)
MCHC: 36.6 g/dL — ABNORMAL HIGH (ref 30.0–36.0)
MCV: 101.1 fL — ABNORMAL HIGH (ref 80.0–100.0)
Platelets: 236 10*3/uL (ref 150–400)
RBC: 2.65 MIL/uL — ABNORMAL LOW (ref 3.87–5.11)
RDW: 12.3 % (ref 11.5–15.5)
WBC: 6.8 10*3/uL (ref 4.0–10.5)
nRBC: 0 % (ref 0.0–0.2)

## 2019-08-20 LAB — BASIC METABOLIC PANEL
Anion gap: 10 (ref 5–15)
BUN: 10 mg/dL (ref 8–23)
CO2: 20 mmol/L — ABNORMAL LOW (ref 22–32)
Calcium: 8.6 mg/dL — ABNORMAL LOW (ref 8.9–10.3)
Chloride: 97 mmol/L — ABNORMAL LOW (ref 98–111)
Creatinine, Ser: 0.92 mg/dL (ref 0.44–1.00)
GFR calc Af Amer: >60 mL/min (ref >60)
GFR calc non Af Amer: >60 mL/min (ref >60)
Glucose, Bld: 120 mg/dL — ABNORMAL HIGH (ref 70–99)
Potassium: 3.4 mmol/L — ABNORMAL LOW (ref 3.5–5.1)
Sodium: 127 mmol/L — ABNORMAL LOW (ref 135–145)

## 2019-08-20 MED ORDER — ALPRAZOLAM 0.25 MG PO TABS
0.2500 mg | ORAL_TABLET | Freq: Two times a day (BID) | ORAL | Status: DC | PRN
  Administered 2019-08-20 – 2019-08-22 (×3): 0.25 mg via ORAL
  Filled 2019-08-20 (×3): qty 1

## 2019-08-20 MED ORDER — ACETAMINOPHEN 325 MG PO TABS
650.0000 mg | ORAL_TABLET | Freq: Four times a day (QID) | ORAL | Status: DC | PRN
  Administered 2019-08-20 – 2019-08-22 (×4): 650 mg via ORAL
  Filled 2019-08-20 (×4): qty 2

## 2019-08-20 NOTE — Evaluation (Signed)
Physical Therapy Evaluation Patient Details Name: Regina Ortiz MRN: [id number] DOB: [birth year] Today's Date: 08/20/2019   History of Present Illness  73 yo female with onset of UTI a week before admission, from indwelling foley, was admitted for diarrhea, abd pain and for receiving EGD and colonoscopy.  Had some GI bleeding with no obvious sighting, has AKI and urinary retention from chronic condition.  PMHx:  c-diff, HTN, cauda equina syndrome, RA, coccyx wound, malnutrition, skin breakdown, urinary retention with chronic foley use  Clinical Impression  Pt was seen for mobility on RW, noted her ability to stand up and get OOB was better than expected given her intake and abd pain.   Pt is motivated to go home, was able to outline assistance for all her daily needs which is present other than overnight.  Will follow acutely for these needs, and will work on strengthening of legs, higher balance challenges as she stays on at [place]. Follow up with HHPT for these needs as well.      Follow Up Recommendations Home health PT;Supervision for mobility/OOB    Equipment Recommendations  None recommended by PT    Recommendations for Other Services       Precautions / Restrictions Precautions Precautions: Fall Precaution Comments: foley catheter Restrictions Weight Bearing Restrictions: No      Mobility  Bed Mobility Overal bed mobility: Needs Assistance Bed Mobility: Supine to Sit;Sit to Supine     Supine to sit: Supervision Sit to supine: Supervision   General bed mobility comments: pt has HOB elevated and using bedrail  Transfers Overall transfer level: Needs assistance Equipment used: Rolling walker (2 wheeled);1 person hand held assist Transfers: Sit to/from Stand Sit to Stand: Supervision         General transfer comment: supervised for safety due to evaluation being done  Ambulation/Gait Ambulation/Gait assistance: Min guard(for safety) Gait Distance (Feet): 75  Feet Assistive device: Rolling walker (2 wheeled);1 person hand held assist Gait Pattern/deviations: Step-through pattern;Decreased stride length;Wide base of support;Trunk flexed     General Gait Details: pt is able to maneuver walker with no help,   Stairs            Wheelchair Mobility    Modified Rankin (Stroke Patients Only)       Balance Overall balance assessment: Needs assistance Sitting-balance support: Feet supported Sitting balance-Leahy Scale: Good     Standing balance support: Bilateral upper extremity supported;During functional activity Standing balance-Leahy Scale: Fair Standing balance comment: requires walker for dynamic balance                             Pertinent Vitals/Pain Pain Assessment: 0-10 Pain Score: 5  Pain Location: abdomen Pain Descriptors / Indicators: Grimacing Pain Intervention(s): Limited activity within patient's tolerance;Monitored during session;Premedicated before session;Repositioned    Home Living Family/patient expects to be discharged to:: Private residence Living Arrangements: Alone Available Help at Discharge: Personal care attendant;Available PRN/intermittently Type of Home: House Home Access: Stairs to enter Entrance Stairs-Rails: Chemical engineer of Steps: 4 Home Layout: Two level;Able to live on main level with bedroom/bathroom Home Equipment: Gilford Rile - 2 wheels;Bedside commode Additional Comments: pt reports she has help in house all the time x overnight    Prior Function Level of Independence: Needs assistance   Gait / Transfers Assistance Needed: ambulates with RW limited household levels   ADL's / Homemaking Assistance Needed: aide helps with dressing and bathing  Comments: RW used  regularly now, has help for all housework and cooking     Hand Dominance   Dominant Hand: Right    Extremity/Trunk Assessment   Upper Extremity Assessment Upper Extremity Assessment:  Generalized weakness    Lower Extremity Assessment Lower Extremity Assessment: Generalized weakness    Cervical / Trunk Assessment Cervical / Trunk Assessment: Kyphotic  Communication   Communication: No difficulties  Cognition Arousal/Alertness: Awake/alert Behavior During Therapy: WFL for tasks assessed/performed Overall Cognitive Status: Within Functional Limits for tasks assessed                                 General Comments: pt is able to give reasonable history of home      General Comments General comments (skin integrity, edema, etc.): Pt was noted to have bruising and L calf wound, very fragile skin with follow up care recommended    Exercises     Assessment/Plan    PT Assessment Patient needs continued PT services  PT Problem List Decreased strength;Decreased range of motion;Decreased activity tolerance;Decreased balance;Decreased mobility;Decreased coordination;Decreased knowledge of use of DME;Decreased safety awareness;Pain;Decreased skin integrity       PT Treatment Interventions DME instruction;Gait training;Stair training;Functional mobility training;Therapeutic activities;Therapeutic exercise;Balance training;Neuromuscular re-education;Patient/family education    PT Goals (Current goals can be found in the Care Plan section)  Acute Rehab PT Goals Patient Stated Goal: to get home and get rid of  pain PT Goal Formulation: With patient Time For Goal Achievement: 09/03/19 Potential to Achieve Goals: Good    Frequency Min 3X/week   Barriers to discharge Decreased caregiver support no help overnight    Co-evaluation               AM-PAC PT "6 Clicks" Mobility  Outcome Measure Help needed turning from your back to your side while in a flat bed without using bedrails?: A Little Help needed moving from lying on your back to sitting on the side of a flat bed without using bedrails?: A Little Help needed moving to and from a bed to a  chair (including a wheelchair)?: A Little Help needed standing up from a chair using your arms (e.g., wheelchair or bedside chair)?: A Little Help needed to walk in hospital room?: A Little Help needed climbing 3-5 steps with a railing? : A Little 6 Click Score: 18    End of Session Equipment Utilized During Treatment: Gait belt Activity Tolerance: Patient tolerated treatment well;Patient limited by fatigue;Patient limited by pain(abd pain was not able to be further medicated for the visit) Patient left: in bed;with call bell/phone within reach;with bed alarm set;with nursing/sitter in room Nurse Communication: Mobility status PT Visit Diagnosis: Unsteadiness on feet (R26.81);Muscle weakness (generalized) (M62.81);Difficulty in walking, not elsewhere classified (R26.2);Pain Pain - Right/Left: (abdomen) Pain - part of body: (abdomen)    Time: [id number] PT Time Calculation (min) (ACUTE ONLY): 24 min   Charges:   PT Evaluation $PT Eval Moderate Complexity: 1 Mod PT Treatments $Gait Training: 8-22 mins       Ramond Dial 08/20/2019, 4:20 PM   Mee Hives, PT MS Acute Rehab Dept. Number: [place] and [place]

## 2019-08-20 NOTE — Progress Notes (Addendum)
Subjective: Patient complains of 5 out of 10 periumbilical abdominal pain.  She has not had a bowel movement since EGD and colonoscopy yesterday.  She states she is hungry and wants her diet to be advanced.  Objective: Vital signs in last 24 hours: Temp:  [97.9 F (36.6 C)-98.7 F (37.1 C)] 98.2 F (36.8 C) (12/19 0849) Pulse Rate:  [71-83] 83 (12/19 0849) Resp:  [18] 18 (12/19 0849) BP: (153-173)/(68-86) 170/74 (12/19 0849) SpO2:  [96 %-98 %] 98 % (12/19 0849) Weight change:  Last BM Date: 08/19/19  PE: Frail, elderly, cachectic GENERAL: Mild pallor, no icterus ABDOMEN: Soft, nondistended, hyperactive bowel sounds, mild periumbilical tenderness, no rebound tenderness/rigidity or guarding EXTREMITIES: No deformity  Lab Results: Results for orders placed or performed during the hospital encounter of 08/17/19 (from the past 48 hour(s))  Hemoglobin and hematocrit, blood     Status: Abnormal   Collection Time: 08/18/19  2:11 PM  Result Value Ref Range   Hemoglobin 9.9 (L) 12.0 - 15.0 g/dL   HCT 27.7 (L) 36.0 - 46.0 %    Comment: Performed at [facility], [address] [place], [place] [address]  CBC in AM     Status: Abnormal   Collection Time: 08/19/19  5:52 AM  Result Value Ref Range   WBC 5.8 4.0 - 10.5 K/uL   RBC 2.49 (L) 3.87 - 5.11 MIL/uL   Hemoglobin 9.2 (L) 12.0 - 15.0 g/dL   HCT 26.5 (L) 36.0 - 46.0 %   MCV 106.4 (H) 80.0 - 100.0 fL   MCH 36.9 (H) 26.0 - 34.0 pg   MCHC 34.7 30.0 - 36.0 g/dL   RDW 12.7 11.5 - 15.5 %   Platelets 238 150 - 400 K/uL   nRBC 0.0 0.0 - 0.2 %    Comment: Performed at [facility], [place] [address], [place], [place] [address]  BMP in AM     Status: Abnormal   Collection Time: 08/19/19  5:52 AM  Result Value Ref Range   Sodium 128 (L) 135 - 145 mmol/L   Potassium 3.7 3.5 - 5.1 mmol/L   Chloride 99 98 - 111 mmol/L   CO2 20 (L) 22 - 32 mmol/L   Glucose, Bld 80 70 - 99 mg/dL   BUN 9 8 - 23 mg/dL   Creatinine, Ser 0.92 0.44 -  1.00 mg/dL   Calcium 8.3 (L) 8.9 - 10.3 mg/dL   GFR calc non Af Amer >60 >60 mL/min   GFR calc Af Amer >60 >60 mL/min   Anion gap 9 5 - 15    Comment: Performed at [facility], [place] [address] [place], [place] [address]  CBC in AM     Status: Abnormal   Collection Time: 08/20/19  5:03 AM  Result Value Ref Range   WBC 6.8 4.0 - 10.5 K/uL   RBC 2.65 (L) 3.87 - 5.11 MIL/uL   Hemoglobin 9.8 (L) 12.0 - 15.0 g/dL   HCT 26.8 (L) 36.0 - 46.0 %   MCV 101.1 (H) 80.0 - 100.0 fL   MCH 37.0 (H) 26.0 - 34.0 pg   MCHC 36.6 (H) 30.0 - 36.0 g/dL   RDW 12.3 11.5 - 15.5 %   Platelets 236 150 - 400 K/uL   nRBC 0.0 0.0 - 0.2 %    Comment: Performed at [facility], [place] [address], [place], [place] [address]  BMP in AM     Status: Abnormal  Collection Time: 08/20/19  5:03 AM  Result Value Ref Range   Sodium 127 (L) 135 - 145 mmol/L   Potassium 3.4 (L) 3.5 - 5.1 mmol/L   Chloride 97 (L) 98 - 111 mmol/L   CO2 20 (L) 22 - 32 mmol/L   Glucose, Bld 120 (H) 70 - 99 mg/dL   BUN 10 8 - 23 mg/dL   Creatinine, Ser 0.92 0.44 - 1.00 mg/dL   Calcium 8.6 (L) 8.9 - 10.3 mg/dL   GFR calc non Af Amer >60 >60 mL/min   GFR calc Af Amer >60 >60 mL/min   Anion gap 10 5 - 15    Comment: Performed at [facility] [address], [place], [place] [address]    Studies/Results: No results found.  Medications: I have reviewed the patient's current medications.  Assessment: Patient with abdominal pain and black diarrhea status post EGD and colonoscopy yesterday, fairly unremarkable, biopsies pending Hemoglobin stable at 9.2/9.8  Plan: Abdominal x-ray performed today as patient complains of pain, results pending, if unremarkable may start low fiber diet. She has been started on cholestyramine 4 g twice a day, patient is anxious about constipation as she states she fluctuates between severe diarrhea and severe constipation. Biopsies may be followed as outpatient. Continue iron  supplementation. Patient noted to have erosive gastritis and recommend continuing PPI indefinitely as she is also on low-dose prednisone.  [name], MD 08/20/2019, 10:53 AM

## 2019-08-20 NOTE — Progress Notes (Signed)
PROGRESS NOTE                                                                                                                                                                                                             Patient Demographics:    Regina Ortiz, is a 73 y.o. female, DOB - 11-May-1946, [id number]  Admit date - 08/17/2019   Admitting Physician Lequita Halt, MD  Outpatient Primary MD for the patient is Maurice Small, MD  LOS - 3   Chief Complaint  Patient presents with  . Diarrhea       Brief Narrative    73 y.o. female with medical history significant of with medical history significant ofindwelling Foley since 05/10/2019 neurogenic bladder, herniated disks L4/L5 with cauda equina syndrome s/psurgical decompression on 01/28/2019 by Dr. Ellene Route, pertension, chronic hyponatremia, rheumatoid arthritis on chronic steroid, COPD, macrocytic chronic iron deficiency anemia, and GERD, chronic colitis presented with new onset diarrhea for 3 days. Had her Foley exchanged 2 weeks ago, developed UTI catheter associated 1 week ago.  She went to her urology and was started on Augmentin for 7 days which she completed yesterday.  She started to develop diarrhea about 3 days ago, became severe since yesterday.  Had a total of 5-6 episodes today, diarrhea has been dark-colored.  Also developed cramping like abdominal pain for 3 days, her abdominal pain is localized, 5-6/10 in severity, she could not eat since this morning because the abdominal pain.  Denied any nauseous vomiting, no chest pain no short of breath no cough, or dysuria.  She had 2 episodes of C. difficile colitis in her past, she reported 2 times, and the symptoms are different compared to this time. She also had had chronic colitis, she attributed that to her chronic steroid use.  She saidl usually flareup of her chronic colitis is just loose to watery diarrhea no dark-colored. Patient C. difficile  is negative, he went for endoscopy and colonoscopy 12/18, globin remained stable during hospital stay.    Subjective:    Regina Ortiz today she does report some abdominal pain, has improved, he was able to eat half of her lunch .   Assessment  & Plan :    Active Problems:   Diarrhea   Protein-calorie malnutrition, severe   Acute diarrhea -Patient with history of collagenous colitis,  and C. difficile in the past . -She is Hemoccult positive , no evidence of GI bleed on endoscopy/colonoscopy, Hemoccult positive most likely related to iron, H&H is stable. -C. difficile is negative . -Recently finished Augmentin, so induced antibiotic induced diarrhea versus collagenous colitis . -Tolerating clear liquid diet, advance to soft diet today -Status post endoscopy, significant for gastritis, biopsies were taken, rule out H. pylori, follow on vaginal biopsy to rule out celiac disease.  She is on PPI. -Colonoscopy with finding of polyps, which were removed, biopsies were sent as well. -She is with some abdominal pain today, the middle x-ray was obtained, no acute finding beside moderate gaseous distention likely related to colonoscopy yesterday, abdominal pain has improved and she is tolerating her diet currently.  Hyponatremia -Chronic, sodium around baseline, symptomatic.  AKI,  - likely prerenal from dehydration, resolved with IV fluid, back to baseline  UTI versus colonization. - she just completed treatment for UTI, she did finish her Augmentin. -Did change her Foley catheter given it was not changed after her recent diagnosis of UTI.  Urinary retention -Patient is following with Dr. Alyson Ingles as an outpatient, discussed with son, apparently she did fail some voiding trial in his office few weeks ago, discussed at length with the son, will attempt another voiding trial during hospital stay.   HTN -  continue home meds with perimeters.  Rheumatoid arthritis - continue home dosage  of her steroid, no indication for stressing dose.  She is on both Solu-Cortef and prednisone  Coccyx pressure injury -Wound care consult appreciated  Iron deficiency anemia -Hemoglobin appears to be at baseline, continue to monitor closely, continue with iron supplements  Severe protein calorie malnutrition -Nutritionist consult greatly appreciated, and malnutrition in the setting of chronic illness related to colitis, continue supplements    COVID-19 Labs  No results for input(s): DDIMER, FERRITIN, LDH, CRP in the last 72 hours.  Lab Results  Component Value Date   SARSCOV2NAA NEGATIVE 08/17/2019   SARSCOV2NAA NOT DETECTED 03/17/2019   [place] NEGATIVE 03/12/2019   SARSCOV2NAA NOT DETECTED 02/15/2019     Code Status : Full  Family Communication  : Discussed with son Shanon Brow via phone  Disposition Plan  : Home  Barriers For Discharge : remains symptomatic with diarrhea, in need of further work-up including endoscopy/colonoscopy, with close monitoring of hemoglobin.  Consults  :  GI  Procedures  : None  DVT Prophylaxis  :  SCD.  Lab Results  Component Value Date   PLT 236 08/20/2019    Antibiotics  :    Anti-infectives (From admission, onward)   Start     Dose/Rate Route Frequency Ordered Stop   08/19/19 1800  hydroxychloroquine (PLAQUENIL) tablet 200 mg     200 mg Oral Every evening 08/19/19 1122     08/19/19 1130  sulfamethoxazole-trimethoprim (BACTRIM DS) 800-160 MG per tablet 1 tablet  Status:  Discontinued     1 tablet Oral 2 times daily 08/19/19 1122 08/19/19 1147        Objective:   Vitals:   08/19/19 1121 08/19/19 2030 08/20/19 0424 08/20/19 0849  BP: (!) 153/86 (!) 173/70 (!) 154/68 (!) 170/74  Pulse: 71 82 80 83  Resp: 18 18 18 18   Temp: 97.9 F (36.6 C) 98.7 F (37.1 C) 98.6 F (37 C) 98.2 F (36.8 C)  TempSrc: Oral Oral Oral Oral  SpO2: 96% 98% 98% 98%  Weight:      Height:  Wt Readings from Last 3 Encounters:  08/17/19  44.9 kg  04/15/19 43.6 kg  04/14/19 43.6 kg     Intake/Output Summary (Last 24 hours) at 08/20/2019 1500 Last data filed at 08/20/2019 1300 Gross per 24 hour  Intake 240 ml  Output 3650 ml  Net -3410 ml     Physical Exam  Awake Alert, Oriented X 3, No new F.N deficits, Normal affect, frail Symmetrical Chest wall movement, Good air movement bilaterally, CTAB RRR,No Gallops,Rubs or new Murmurs, No Parasternal Heave +ve B.Sounds, Abd Soft, mildly distended,  no rebound - guarding or rigidity. No Cyanosis, Clubbing or edema, No new Rash or bruise        Data Review:    CBC Recent Labs  Lab 08/17/19 1504 08/17/19 1855 08/18/19 0304 08/18/19 1411 08/19/19 0552 08/20/19 0503  WBC 9.4  --   --   --  5.8 6.8  HGB 9.9* 8.6* 8.2* 9.9* 9.2* 9.8*  HCT 28.7* 24.3* 24.2* 27.7* 26.5* 26.8*  PLT 236  --   --   --  238 236  MCV 107.9*  --   --   --  106.4* 101.1*  MCH 37.2*  --   --   --  36.9* 37.0*  MCHC 34.5  --   --   --  34.7 36.6*  RDW 13.0  --   --   --  12.7 12.3  LYMPHSABS 0.6*  --   --   --   --   --   MONOABS 0.4  --   --   --   --   --   EOSABS 0.0  --   --   --   --   --   BASOSABS 0.0  --   --   --   --   --     Chemistries  Recent Labs  Lab 08/17/19 1504 08/18/19 0304 08/19/19 0552 08/20/19 0503  NA 126* 129* 128* 127*  K 4.4 4.9 3.7 3.4*  CL 96* 102 99 97*  CO2 19* 18* 20* 20*  GLUCOSE 134* 95 80 120*  BUN 18 15 9 10   CREATININE 1.37* 1.15* 0.92 0.92  CALCIUM 9.4 8.3* 8.3* 8.6*  AST 23  --   --   --   ALT 26  --   --   --   ALKPHOS 41  --   --   --   BILITOT 0.5  --   --   --    ------------------------------------------------------------------------------------------------------------------ No results for input(s): CHOL, HDL, LDLCALC, TRIG, CHOLHDL, LDLDIRECT in the last 72 hours.  Lab Results  Component Value Date   HGBA1C 5.4 09/24/2017    ------------------------------------------------------------------------------------------------------------------ No results for input(s): TSH, T4TOTAL, T3FREE, THYROIDAB in the last 72 hours.  Invalid input(s): FREET3 ------------------------------------------------------------------------------------------------------------------ No results for input(s): VITAMINB12, FOLATE, FERRITIN, TIBC, IRON, RETICCTPCT in the last 72 hours.  Coagulation profile Recent Labs  Lab 08/17/19 1504  INR 1.0    No results for input(s): DDIMER in the last 72 hours.  Cardiac Enzymes No results for input(s): CKMB, TROPONINI, MYOGLOBIN in the last 168 hours.  Invalid input(s): CK ------------------------------------------------------------------------------------------------------------------    Component Value Date/Time   BNP 238.0 (H) 02/19/2019 [id number]    Inpatient Medications  Scheduled Meds: . busPIRone  30 mg Oral BID  . Chlorhexidine Gluconate Cloth  6 each Topical Daily  . cholecalciferol  1,000 Units Oral QHS  . cholestyramine  4 g Oral BID  . diltiazem  360 mg Oral Daily  .  feeding supplement  1 Container Oral TID BM  . ferrous sulfate  325 mg Oral Daily  . hydrALAZINE  25 mg Oral QID  . hydrocortisone  5 mg Oral QPM  . hydroxychloroquine  200 mg Oral QPM  . multivitamin with minerals  1 tablet Oral Daily  . pantoprazole  40 mg Oral Daily  . predniSONE  5 mg Oral Q breakfast   Continuous Infusions:  PRN Meds:.acetaminophen, ALPRAZolam  Micro Results Recent Results (from the past 240 hour(s))  SARS CORONAVIRUS 2 (TAT 6-24 HRS) Nasopharyngeal Nasopharyngeal Swab     Status: None   Collection Time: 08/17/19  7:01 PM   Specimen: Nasopharyngeal Swab  Result Value Ref Range Status   SARS Coronavirus 2 NEGATIVE NEGATIVE Final    Comment: (NOTE) SARS-CoV-2 target nucleic acids are NOT DETECTED. The SARS-CoV-2 RNA is generally detectable in upper and lower respiratory specimens  during the acute phase of infection. Negative results do not preclude SARS-CoV-2 infection, do not rule out co-infections with other pathogens, and should not be used as the sole basis for treatment or other patient management decisions. Negative results must be combined with clinical observations, patient history, and epidemiological information. The expected result is Negative. Fact Sheet for Patients: SugarRoll.be Fact Sheet for Healthcare Providers: https://www.woods-mathews.com/ This test is not yet approved or cleared by the [place] FDA and  has been authorized for detection and/or diagnosis of SARS-CoV-2 by FDA under an Emergency Use Authorization (EUA). This EUA will remain  in effect (meaning this test can be used) for the duration of the COVID-19 declaration under Section 56 4(b)(1) of the Act, 21 U.S.C. section 360bbb-3(b)(1), unless the authorization is terminated or revoked sooner. Performed at [facility], [place] [address] [place], [place] [address]   C Difficile Quick Screen w PCR reflex     Status: None   Collection Time: 08/18/19  3:44 AM   Specimen: STOOL  Result Value Ref Range Status   C Diff antigen NEGATIVE NEGATIVE Final   C Diff toxin NEGATIVE NEGATIVE Final   C Diff interpretation No C. difficile detected.  Final    Comment: Performed at [facility], [place] [address], [place], [place] [address]    Radiology Reports DG BONE DENSITY (DXA)  Result Date: 08/16/2019 EXAM: DUAL X-RAY ABSORPTIOMETRY (DXA) FOR BONE MINERAL DENSITY IMPRESSION: Referring Physician:  Gavin Pound Your patient completed a BMD test using Lunar IDXA DXA system ( analysis version: 16 ) manufactured by [employer]. Technologist: CG PATIENT: Name: Regina Ortiz, Regina Ortiz Patient ID: [id number] Birth Date: 09-Apr-1946 Height: 61.0 in. Sex: Female Measured: 08/16/2019 Weight: 87.8 lbs. Indications: Advanced Age, Caucasian, Desyrel, Estrogen  Deficient, Hysterectomy, Long term steroid use, Prednisone, Prilosec, Right hip replacement, Wellbutrin Fractures: Rt hip Treatments: Calcium (E943.0), Fosamax, Vitamin D (E933.5) ASSESSMENT: The BMD measured at Femur Neck is 0.633 g/cm2 with a T-score of -2.9. This patient is considered osteoporotic according to [facility]) criteria. There has been a statistically significant decrease in BMD of Left hip since prior exam dated 08/12/2018. The scan quality is limited by patient condition. Lumbar spine was not utilized due to exclusion on prior exam. Right femur was excluded due to surgical hardware. Site Region Measured Date Measured Age [age]A BMD Significant CHANGE T-score Left Femur Neck 08/16/2019 74.4 [age]2.8 0.633 g/cm2 * Left Femur Neck 08/12/2018 73.4 -2.2 0.718 g/cm2 Left Forearm Radius 33% 08/16/2019 74.4 -1.2 0.778 g/cm2 Left Forearm Radius 33% 08[age]12/2018 73.4 -1.1 0.792 g/cm2 [place]  Organization (WHO) criteria for post-menopausal, Caucasian Women: Normal       T-score at or above -1 SD Osteopenia   T-score between -1 and -2.5 SD Osteoporosis T-score at or below -2.5 SD RECOMMENDATION: 1. All patients should optimize calcium and vitamin D intake. 2. Consider FDA approved medical therapies in postmenopausal women and men aged 54 years and older, based on the following: a. A hip or vertebral (clinical or morphometric) fracture b. T- score < or = -2.5 at the femoral neck or spine after appropriate evaluation to exclude secondary causes c. Low bone mass (T-score between -1.0 and -2.5 at the femoral neck or spine) and a 10 year probability of a hip fracture > or = 3% or a 10 year probability of a major osteoporosis-related fracture > or = 20% based on the [place]-adapted WHO algorithm d. Clinician judgment and/or patient preferences may indicate treatment for people with 10-year fracture probabilities above or below these levels FOLLOW-UP: Patients with diagnosis of osteoporosis or at high risk  for fracture should have regular bone mineral density tests. For patients eligible for Medicare, routine testing is allowed once every 2 years. The testing frequency can be increased to one year for patients who have rapidly progressing disease, those who are receiving or discontinuing medical therapy to restore bone mass, or have additional risk factors. [facility] Electronically Signed   By: Rolm Baptise M.D.   On: 08/16/2019 14:36   DG Abd Portable 1V  Result Date: 08/20/2019 CLINICAL DATA:  Abdominal pain.  Diarrhea EXAM: PORTABLE ABDOMEN - 1 VIEW COMPARISON:  02/15/2019 FINDINGS: Moderate gaseous distention of proximal colon. No abnormal stool retention. No small bowel dilatation, concerning mass effect, or concerning gas collection. Lung bases are clear. L4-5 fusion and right femoral neck repair. IMPRESSION: Moderate gaseous distention of the proximal colon with overall nonobstructive bowel gas pattern. Electronically Signed   By: Monte Fantasia M.D.   On: 08/20/2019 11:39     Phillips Climes M.D on 08/20/2019 at 3:00 PM  Between 7am to 7pm - Pager - [phone]  After 7pm go to [email or web address] - password [facility]  [facility] -  Office  [phone]

## 2019-08-21 LAB — CBC
HCT: 26.1 % — ABNORMAL LOW (ref 36.0–46.0)
Hemoglobin: 9.7 g/dL — ABNORMAL LOW (ref 12.0–15.0)
MCH: 37 pg — ABNORMAL HIGH (ref 26.0–34.0)
MCHC: 37.2 g/dL — ABNORMAL HIGH (ref 30.0–36.0)
MCV: 99.6 fL (ref 80.0–100.0)
Platelets: 232 10*3/uL (ref 150–400)
RBC: 2.62 MIL/uL — ABNORMAL LOW (ref 3.87–5.11)
RDW: 12 % (ref 11.5–15.5)
WBC: 6.9 10*3/uL (ref 4.0–10.5)
nRBC: 0 % (ref 0.0–0.2)

## 2019-08-21 LAB — BASIC METABOLIC PANEL
Anion gap: 9 (ref 5–15)
BUN: 7 mg/dL — ABNORMAL LOW (ref 8–23)
CO2: 20 mmol/L — ABNORMAL LOW (ref 22–32)
Calcium: 8.5 mg/dL — ABNORMAL LOW (ref 8.9–10.3)
Chloride: 97 mmol/L — ABNORMAL LOW (ref 98–111)
Creatinine, Ser: 0.78 mg/dL (ref 0.44–1.00)
GFR calc Af Amer: >60 mL/min (ref >60)
GFR calc non Af Amer: >60 mL/min (ref >60)
Glucose, Bld: 113 mg/dL — ABNORMAL HIGH (ref 70–99)
Potassium: 3.3 mmol/L — ABNORMAL LOW (ref 3.5–5.1)
Sodium: 126 mmol/L — ABNORMAL LOW (ref 135–145)

## 2019-08-21 MED ORDER — HYDRALAZINE HCL 50 MG PO TABS
50.0000 mg | ORAL_TABLET | Freq: Three times a day (TID) | ORAL | Status: DC
  Administered 2019-08-21 – 2019-08-23 (×6): 50 mg via ORAL
  Filled 2019-08-21 (×6): qty 1

## 2019-08-21 MED ORDER — POTASSIUM CHLORIDE CRYS ER 20 MEQ PO TBCR
40.0000 meq | EXTENDED_RELEASE_TABLET | ORAL | Status: AC
  Administered 2019-08-21 (×2): 40 meq via ORAL
  Filled 2019-08-21 (×2): qty 2

## 2019-08-21 MED ORDER — CHOLESTYRAMINE 4 G PO PACK
4.0000 g | PACK | Freq: Every morning | ORAL | Status: DC
  Administered 2019-08-22 (×2): 4 g via ORAL
  Filled 2019-08-21 (×3): qty 1

## 2019-08-21 NOTE — Progress Notes (Signed)
Subjective: Patient was walking around in the hallway with a walker. She states she has not had a bowel movement since she drank a prep for colonoscopy. She reports improvement in abdominal pain.  Objective: Vital signs in last 24 hours: Temp:  [98.1 F (36.7 C)-98.2 F (36.8 C)] 98.1 F (36.7 C) (12/20 0909) Pulse Rate:  [58-90] 89 (12/20 0909) Resp:  [17-18] 18 (12/20 0909) BP: (161)/(74-87) 161/74 (12/20 0909) SpO2:  [98 %-100 %] 98 % (12/20 0909) Weight change:  Last BM Date: 08/19/19  PE: Frail, thinly built, chronically ill-appearing  GENERAL: Mild pallor, no icterus ABDOMEN: Nondistended EXTREMITIES: no Deformity Lab Results: Results for orders placed or performed during the hospital encounter of 08/17/19 (from the past 48 hour(s))  CBC in AM     Status: Abnormal   Collection Time: 08/20/19  5:03 AM  Result Value Ref Range   WBC 6.8 4.0 - 10.5 K/uL   RBC 2.65 (L) 3.87 - 5.11 MIL/uL   Hemoglobin 9.8 (L) 12.0 - 15.0 g/dL   HCT 26.8 (L) 36.0 - 46.0 %   MCV 101.1 (H) 80.0 - 100.0 fL   MCH 37.0 (H) 26.0 - 34.0 pg   MCHC 36.6 (H) 30.0 - 36.0 g/dL   RDW 12.3 11.5 - 15.5 %   Platelets 236 150 - 400 K/uL   nRBC 0.0 0.0 - 0.2 %    Comment: Performed at [facility], [place] [address], [place], [place] [address]  BMP in AM     Status: Abnormal   Collection Time: 08/20/19  5:03 AM  Result Value Ref Range   Sodium 127 (L) 135 - 145 mmol/L   Potassium 3.4 (L) 3.5 - 5.1 mmol/L   Chloride 97 (L) 98 - 111 mmol/L   CO2 20 (L) 22 - 32 mmol/L   Glucose, Bld 120 (H) 70 - 99 mg/dL   BUN 10 8 - 23 mg/dL   Creatinine, Ser 0.92 0.44 - 1.00 mg/dL   Calcium 8.6 (L) 8.9 - 10.3 mg/dL   GFR calc non Af Amer >60 >60 mL/min   GFR calc Af Amer >60 >60 mL/min   Anion gap 10 5 - 15    Comment: Performed at [facility] [address] [place], [place] [address]  CBC in AM     Status: Abnormal   Collection Time: 08/21/19  7:19 AM  Result Value Ref Range   WBC 6.9 4.0 - 10.5 K/uL    RBC 2.62 (L) 3.87 - 5.11 MIL/uL   Hemoglobin 9.7 (L) 12.0 - 15.0 g/dL   HCT 26.1 (L) 36.0 - 46.0 %   MCV 99.6 80.0 - 100.0 fL   MCH 37.0 (H) 26.0 - 34.0 pg   MCHC 37.2 (H) 30.0 - 36.0 g/dL   RDW 12.0 11.5 - 15.5 %   Platelets 232 150 - 400 K/uL   nRBC 0.0 0.0 - 0.2 %    Comment: Performed at [facility] [address], [place], [place] [address]  BMP in AM     Status: Abnormal   Collection Time: 08/21/19  7:19 AM  Result Value Ref Range   Sodium 126 (L) 135 - 145 mmol/L   Potassium 3.3 (L) 3.5 - 5.1 mmol/L   Chloride 97 (L) 98 - 111 mmol/L   CO2 20 (L) 22 - 32 mmol/L   Glucose, Bld 113 (H) 70 - 99 mg/dL   BUN 7 (L) 8 - 23 mg/dL   Creatinine, Ser  0.78 0.44 - 1.00 mg/dL   Calcium 8.5 (L) 8.9 - 10.3 mg/dL   GFR calc non Af Amer >60 >60 mL/min   GFR calc Af Amer >60 >60 mL/min   Anion gap 9 5 - 15    Comment: Performed at [facility] [address], [place], [place] [address]    Studies/Results: DG Abd Portable 1V  Result Date: 08/20/2019 CLINICAL DATA:  Abdominal pain.  Diarrhea EXAM: PORTABLE ABDOMEN - 1 VIEW COMPARISON:  02/15/2019 FINDINGS: Moderate gaseous distention of proximal colon. No abnormal stool retention. No small bowel dilatation, concerning mass effect, or concerning gas collection. Lung bases are clear. L4-5 fusion and right femoral neck repair. IMPRESSION: Moderate gaseous distention of the proximal colon with overall nonobstructive bowel gas pattern. Electronically Signed   By: Monte Fantasia M.D.   On: 08/20/2019 11:39    Medications: I have reviewed the patient's current medications.  Assessment: IBS, fluctuates between diarrhea and constipation Erosive gastritis noted, biopsies pending Random colon biopsies pending, history of collagenous colitis  Abdominal x-ray showed moderate gaseous distention of proximal colon with overall nonobstructive bowel gas pattern  Hyponatremia, hypokalemia, mild acidosis, macrocytic  anemia  Plan: Patient on regular diet, will decrease cholestyramine to once a day as patient has not had a bowel movement and is anxious about constipation. Continue ferrous sulfate. Continue pantoprazole once a day. Biopsies will be followed as an outpatient. GI will sign off, please recall if needed.  [name], MD 08/21/2019, 11:15 AM

## 2019-08-21 NOTE — Plan of Care (Signed)
  Problem: Safety: Goal: Ability to remain free from injury will improve Outcome: Progressing   

## 2019-08-21 NOTE — Progress Notes (Signed)
Patient walked in the hall.

## 2019-08-21 NOTE — Progress Notes (Signed)
PROGRESS NOTE                                                                                                                                                                                                             Patient Demographics:    Regina Ortiz, is a 73 y.o. female, DOB - 1945-10-12, [id number]  Admit date - 08/17/2019   Admitting Physician Lequita Halt, MD  Outpatient Primary MD for the patient is Maurice Small, MD  LOS - 4   Chief Complaint  Patient presents with  . Diarrhea       Brief Narrative    73 y.o. female with medical history significant of with medical history significant ofindwelling Foley since 05/10/2019 neurogenic bladder, herniated disks L4/L5 with cauda equina syndrome s/psurgical decompression on 01/28/2019 by Dr. Ellene Route, pertension, chronic hyponatremia, rheumatoid arthritis on chronic steroid, COPD, macrocytic chronic iron deficiency anemia, and GERD, chronic colitis presented with new onset diarrhea for 3 days. Had her Foley exchanged 2 weeks ago, developed UTI catheter associated 1 week ago.  She went to her urology and was started on Augmentin for 7 days which she completed yesterday.  She started to develop diarrhea about 3 days ago, became severe since yesterday.  Had a total of 5-6 episodes today, diarrhea has been dark-colored.  Also developed cramping like abdominal pain for 3 days, her abdominal pain is localized, 5-6/10 in severity, she could not eat since this morning because the abdominal pain.  Denied any nauseous vomiting, no chest pain no short of breath no cough, or dysuria.  She had 2 episodes of C. difficile colitis in her past, she reported 2 times, and the symptoms are different compared to this time. She also had had chronic colitis, she attributed that to her chronic steroid use.  She saidl usually flareup of her chronic colitis is just loose to watery diarrhea no dark-colored. Patient C. difficile  is negative, he went for endoscopy and colonoscopy 12/18, hemoglobin remained stable during hospital stay.    Subjective:    [name] today no further diarrhea, appetite has improved, no more abdominal pain, she did require in and out once yesterday with 100 cc urine output, but she had couple of voiding episodes after that.   Assessment  & Plan :    Active Problems:   Diarrhea  Protein-calorie malnutrition, severe   Acute diarrhea -Patient with history of collagenous colitis, and C. difficile in the past . -She is Hemoccult positive , no evidence of GI bleed on endoscopy/colonoscopy, Hemoccult positive most likely related to iron, H&H is stable. -C. difficile is negative . -Recently finished Augmentin, so induced antibiotic induced diarrhea versus collagenous colitis . -Tolerating clear liquid diet, advance to soft diet today -Status post endoscopy, significant for gastritis, biopsies were taken, rule out H. pylori, follow on vaginal biopsy to rule out celiac disease.  She is on PPI. -Colonoscopy with finding of polyps, which were removed, biopsies were sent as well. -The minimal pain has resolved   hyponatremia -Chronic, sodium around baseline, asymptomatic.  AKI,  - likely prerenal from dehydration, resolved with IV fluid, back to baseline  UTI versus colonization. - she just completed treatment for UTI, she did finish her Augmentin. -Did change her Foley catheter given it was not changed after her recent diagnosis of UTI.  Urinary retention -Patient is following with Dr. Alyson Ingles as an outpatient, discussed with son, apparently she did fail  voiding trial in his office few weeks ago. -Attempting another voiding trial during hospital stay, Foley catheter was discontinued yesterday, she did require one-time in and out with 800 cc urine output, but she was able to void couple times since, will continue to monitor closely, will continue with bladder scan every 8 hours, in  and out for more than 600 cc, try to avoid reinserting her Foley catheter  given multiple UTIs in the last few month, and known history of C. Difficile.   HTN -Pressure controlled, will increase hydralazine to 50 mg 3 times daily.  Rheumatoid arthritis - continue home dosage of her steroid, no indication for stressing dose.  She is on both Solu-Cortef and prednisone  Hypokalemia -Repleted, recheck in a.m.  Coccyx pressure injury -Wound care consult appreciated  Iron deficiency anemia -Hemoglobin appears to be at baseline, continue to monitor closely, continue with iron supplements  Severe protein calorie malnutrition -Nutritionist consult greatly appreciated, and malnutrition in the setting of chronic illness related to colitis, continue supplements    COVID-19 Labs  No results for input(s): DDIMER, FERRITIN, LDH, CRP in the last 72 hours.  Lab Results  Component Value Date   [place] NEGATIVE 08/17/2019   SARSCOV2NAA NOT DETECTED 03/17/2019   [place] NEGATIVE 03/12/2019   SARSCOV2NAA NOT DETECTED 02/15/2019     Code Status : Full  Family Communication  : Discussed with son Shanon Brow via phone 12/19  Disposition Plan  : Home  Barriers For Discharge : Currently having voiding trial for the patient, requiring frequent bladder scan and in and out, as she failed her voiding trial as an outpatient at her urologist office.  Consults  :  GI  Procedures  : None  DVT Prophylaxis  :  SCD. Patient is more ambulatory  Lab Results  Component Value Date   PLT 232 08/21/2019    Antibiotics  :    Anti-infectives (From admission, onward)   Start     Dose/Rate Route Frequency Ordered Stop   08/19/19 1800  hydroxychloroquine (PLAQUENIL) tablet 200 mg     200 mg Oral Every evening 08/19/19 1122     08/19/19 1130  sulfamethoxazole-trimethoprim (BACTRIM DS) 800-160 MG per tablet 1 tablet  Status:  Discontinued     1 tablet Oral 2 times daily 08/19/19 1122 08/19/19 1147          Objective:   Vitals:  08/20/19 0849 08/20/19 1633 08/20/19 2026 08/21/19 0909  BP: (!) 170/74 (!) 161/83 (!) 161/87 (!) 161/74  Pulse: 83 (!) 58 90 89  Resp: 18 18 17 18   Temp: 98.2 F (36.8 C) 98.2 F (36.8 C) 98.2 F (36.8 C) 98.1 F (36.7 C)  TempSrc: Oral Oral  Oral  SpO2: 98% 100% 100% 98%  Weight:      Height:        Wt Readings from Last 3 Encounters:  08/17/19 44.9 kg  04/15/19 43.6 kg  04/14/19 43.6 kg     Intake/Output Summary (Last 24 hours) at 08/21/2019 1301 Last data filed at 08/21/2019 0900 Gross per 24 hour  Intake 480 ml  Output 2700 ml  Net -2220 ml     Physical Exam  Awake Alert, Oriented X 3, No new F.N deficits, Normal affect, frail Symmetrical Chest wall movement, Good air movement bilaterally, CTAB RRR,No Gallops,Rubs or new Murmurs, No Parasternal Heave +ve B.Sounds, Abd Soft, No tenderness, No rebound - guarding or rigidity. No Cyanosis, Clubbing or edema, No new Rash or bruise      Data Review:    CBC Recent Labs  Lab 08/17/19 1504 08/18/19 0304 08/18/19 1411 08/19/19 0552 08/20/19 0503 08/21/19 0719  WBC 9.4  --   --  5.8 6.8 6.9  HGB 9.9* 8.2* 9.9* 9.2* 9.8* 9.7*  HCT 28.7* 24.2* 27.7* 26.5* 26.8* 26.1*  PLT 236  --   --  238 236 232  MCV 107.9*  --   --  106.4* 101.1* 99.6  MCH 37.2*  --   --  36.9* 37.0* 37.0*  MCHC 34.5  --   --  34.7 36.6* 37.2*  RDW 13.0  --   --  12.7 12.3 12.0  LYMPHSABS 0.6*  --   --   --   --   --   MONOABS 0.4  --   --   --   --   --   EOSABS 0.0  --   --   --   --   --   BASOSABS 0.0  --   --   --   --   --     Chemistries  Recent Labs  Lab 08/17/19 1504 08/18/19 0304 08/19/19 0552 08/20/19 0503 08/21/19 0719  NA 126* 129* 128* 127* 126*  K 4.4 4.9 3.7 3.4* 3.3*  CL 96* 102 99 97* 97*  CO2 19* 18* 20* 20* 20*  GLUCOSE 134* 95 80 120* 113*  BUN 18 15 9 10  7*  CREATININE 1.37* 1.15* 0.92 0.92 0.78  CALCIUM 9.4 8.3* 8.3* 8.6* 8.5*  AST 23  --   --   --   --   ALT 26   --   --   --   --   ALKPHOS 41  --   --   --   --   BILITOT 0.5  --   --   --   --    ------------------------------------------------------------------------------------------------------------------ No results for input(s): CHOL, HDL, LDLCALC, TRIG, CHOLHDL, LDLDIRECT in the last 72 hours.  Lab Results  Component Value Date   HGBA1C 5.4 09/24/2017   ------------------------------------------------------------------------------------------------------------------ No results for input(s): TSH, T4TOTAL, T3FREE, THYROIDAB in the last 72 hours.  Invalid input(s): FREET3 ------------------------------------------------------------------------------------------------------------------ No results for input(s): VITAMINB12, FOLATE, FERRITIN, TIBC, IRON, RETICCTPCT in the last 72 hours.  Coagulation profile Recent Labs  Lab 08/17/19 1504  INR 1.0    No results for input(s): DDIMER in the last 72 hours.  Cardiac Enzymes No results for input(s): CKMB, TROPONINI, MYOGLOBIN in the last 168 hours.  Invalid input(s): CK ------------------------------------------------------------------------------------------------------------------    Component Value Date/Time   BNP 238.0 (H) 02/19/2019 [id number]    Inpatient Medications  Scheduled Meds: . busPIRone  30 mg Oral BID  . Chlorhexidine Gluconate Cloth  6 each Topical Daily  . cholecalciferol  1,000 Units Oral QHS  . cholestyramine  4 g Oral q morning - 10a  . diltiazem  360 mg Oral Daily  . feeding supplement  1 Container Oral TID BM  . ferrous sulfate  325 mg Oral Daily  . hydrALAZINE  50 mg Oral Q8H  . hydrocortisone  5 mg Oral QPM  . hydroxychloroquine  200 mg Oral QPM  . multivitamin with minerals  1 tablet Oral Daily  . pantoprazole  40 mg Oral Daily  . predniSONE  5 mg Oral Q breakfast   Continuous Infusions:  PRN Meds:.acetaminophen, ALPRAZolam  Micro Results Recent Results (from the past 240 hour(s))  SARS CORONAVIRUS 2  (TAT 6-24 HRS) Nasopharyngeal Nasopharyngeal Swab     Status: None   Collection Time: 08/17/19  7:01 PM   Specimen: Nasopharyngeal Swab  Result Value Ref Range Status   SARS Coronavirus 2 NEGATIVE NEGATIVE Final    Comment: (NOTE) SARS-CoV-2 target nucleic acids are NOT DETECTED. The SARS-CoV-2 RNA is generally detectable in upper and lower respiratory specimens during the acute phase of infection. Negative results do not preclude SARS-CoV-2 infection, do not rule out co-infections with other pathogens, and should not be used as the sole basis for treatment or other patient management decisions. Negative results must be combined with clinical observations, patient history, and epidemiological information. The expected result is Negative. Fact Sheet for Patients: SugarRoll.be Fact Sheet for Healthcare Providers: https://www.woods-mathews.com/ This test is not yet approved or cleared by the [place] FDA and  has been authorized for detection and/or diagnosis of SARS-CoV-2 by FDA under an Emergency Use Authorization (EUA). This EUA will remain  in effect (meaning this test can be used) for the duration of the COVID-19 declaration under Section 56 4(b)(1) of the Act, 21 U.S.C. section 360bbb-3(b)(1), unless the authorization is terminated or revoked sooner. Performed at [facility], [place] [address], [place], [place] [address]   C Difficile Quick Screen w PCR reflex     Status: None   Collection Time: 08/18/19  3:44 AM   Specimen: STOOL  Result Value Ref Range Status   C Diff antigen NEGATIVE NEGATIVE Final   C Diff toxin NEGATIVE NEGATIVE Final   C Diff interpretation No C. difficile detected.  Final    Comment: Performed at [facility], [place] [address] [place], [place] [address]    Radiology Reports DG BONE DENSITY (DXA)  Result Date: 08/16/2019 EXAM: DUAL X-RAY ABSORPTIOMETRY (DXA) FOR BONE MINERAL DENSITY IMPRESSION:  Referring Physician:  Gavin Pound Your patient completed a BMD test using Lunar IDXA DXA system ( analysis version: 16 ) manufactured by [employer]. Technologist: CG PATIENT: Name: Dajia, Chambley Patient ID: [id number] Birth Date: 1946-05-05 Height: 61.0 in. Sex: Female Measured: 08/16/2019 Weight: 87.8 lbs. Indications: Advanced Age, Caucasian, Desyrel, Estrogen Deficient, Hysterectomy, Long term steroid use, Prednisone, Prilosec, Right hip replacement, Wellbutrin Fractures: Rt hip Treatments: Calcium (E943.0), Fosamax, Vitamin D (E933.5) ASSESSMENT: The BMD measured at Femur Neck is 0.633 g/cm2 with a T-score of -2.9. This patient is considered osteoporotic according to [facility]) criteria. There has been a statistically significant  decrease in BMD of Left hip since prior exam dated 08/12/2018. The scan quality is limited by patient condition. Lumbar spine was not utilized due to exclusion on prior exam. Right femur was excluded due to surgical hardware. Site Region Measured Date Measured Age [age]A BMD Significant CHANGE T-score Left Femur Neck 08/16/2019 74.4 [age]2.8 0.633 g/cm2 * Left Femur Neck 08[age]12/2018 73.4 [age]2.2 0.718 g/cm2 Left Forearm Radius 33% 08/16/2019 74.4 -1.2 0.778 g/cm2 Left Forearm Radius 33% 08/12/2018 73.4 -1.1 0.792 g/cm2 World Health Organization [facility]) criteria for post-menopausal, Caucasian Women: Normal       T-score at or above -1 SD Osteopenia   T-score between -1 and -2.5 SD Osteoporosis T-score at or below -2.5 SD RECOMMENDATION: 1. All patients should optimize calcium and vitamin D intake. 2. Consider FDA approved medical therapies in postmenopausal women and men aged 9 years and older, based on the following: a. A hip or vertebral (clinical or morphometric) fracture b. T- score < or = -2.5 at the femoral neck or spine after appropriate evaluation to exclude secondary causes c. Low bone mass (T-score between -1.0 and -2.5 at the femoral neck or spine) and a 10 year  probability of a hip fracture > or = 3% or a 10 year probability of a major osteoporosis-related fracture > or = 20% based on the [place]-adapted WHO algorithm d. Clinician judgment and/or patient preferences may indicate treatment for people with 10-year fracture probabilities above or below these levels FOLLOW-UP: Patients with diagnosis of osteoporosis or at high risk for fracture should have regular bone mineral density tests. For patients eligible for Medicare, routine testing is allowed once every 2 years. The testing frequency can be increased to one year for patients who have rapidly progressing disease, those who are receiving or discontinuing medical therapy to restore bone mass, or have additional risk factors. [facility] Radiology Electronically Signed   By: Rolm Baptise M.D.   On: 08/16/2019 14:36   DG Abd Portable 1V  Result Date: 08/20/2019 CLINICAL DATA:  Abdominal pain.  Diarrhea EXAM: PORTABLE ABDOMEN - 1 VIEW COMPARISON:  02/15/2019 FINDINGS: Moderate gaseous distention of proximal colon. No abnormal stool retention. No small bowel dilatation, concerning mass effect, or concerning gas collection. Lung bases are clear. L4-5 fusion and right femoral neck repair. IMPRESSION: Moderate gaseous distention of the proximal colon with overall nonobstructive bowel gas pattern. Electronically Signed   By: Monte Fantasia M.D.   On: 08/20/2019 11:39     Phillips Climes M.D on 08/21/2019 at 1:01 PM  Between 7am to 7pm - Pager - [phone]  After 7pm go to [email or web address] - password [email or web address] -  Office  [phone]

## 2019-08-22 DIAGNOSIS — I10 Essential (primary) hypertension: Secondary | ICD-10-CM

## 2019-08-22 LAB — SURGICAL PATHOLOGY

## 2019-08-22 LAB — GI PATHOGEN PANEL BY PCR, STOOL

## 2019-08-22 MED ORDER — CHOLESTYRAMINE 4 G PO PACK: 4.0000 g | PACK | Freq: Three times a day (TID) | ORAL | 1 refills | Status: DC

## 2019-08-22 MED ORDER — ACETAMINOPHEN 325 MG PO TABS: 650.0000 mg | ORAL_TABLET | Freq: Four times a day (QID) | ORAL | Status: DC | PRN

## 2019-08-22 MED ORDER — HYDRALAZINE HCL 50 MG PO TABS: 50.0000 mg | ORAL_TABLET | Freq: Four times a day (QID) | ORAL | 0 refills | Status: DC

## 2019-08-22 MED ORDER — PANTOPRAZOLE SODIUM 40 MG PO TBEC: 40.0000 mg | DELAYED_RELEASE_TABLET | Freq: Every day | ORAL | 1 refills | Status: DC

## 2019-08-22 NOTE — Discharge Summary (Signed)
Regina Ortiz, is a 73 y.o. female  DOB [birth year]  MRN [id number].  Admission date:  08/17/2019  Admitting Physician  Lequita Halt, MD  Discharge Date:  08/22/2019   Primary MD  Maurice Small, MD  Recommendations for primary care physician for things to follow:  -Please check CBC, CMP during next visit. -Patient will follow with GI as an outpatient regarding final results of her biopsies. -Continue close monitoring for recurrence of urinary retention  Admission Diagnosis  Diarrhea [R19.7] Rectal bleeding [K62.5] Weakness [R53.1] AKI (acute kidney injury) ([place]) [N17.9]   Discharge Diagnosis  Diarrhea [R19.7] Rectal bleeding [K62.5] Weakness [R53.1] AKI (acute kidney injury) ([place]) [N17.9]    Active Problems:   Diarrhea   Protein-calorie malnutrition, severe      Past Medical History:  Diagnosis Date  . AIN grade I   . Anxiety   . Bruises easily   . Chronic diarrhea   . Chronic hyponatremia   . Colitis   . COPD (chronic obstructive pulmonary disease) ([place])   . Depression   . GERD (gastroesophageal reflux disease)   . History of chronic bronchitis   . History of vulvar dysplasia    serveral recurrency's   . Hypertension   . LBBB (left bundle branch block) W/ PROLONGED PR   CARDIOLOGSIT-  DR Johnsie Cancel-   LOV IN EPIC  . Macrocytosis   . Nocturia   . Osteoporosis   . Seronegative rheumatoid arthritis ([place])   . Short of breath on exertion   . Smokers' cough ([place])   . Venous insufficiency of leg    , Edema  . VIN III (vulvar intraepithelial neoplasia III)    RECURRENT -    Past Surgical History:  Procedure Laterality Date  . APPENDECTOMY  1992  . BIOPSY  08/19/2019   Procedure: BIOPSY;  Surgeon: [name], MD;  Location: [facility];  Service: Gastroenterology;;  . CARDIOVASCULAR STRESS TEST  06-23-2012  DR Jenkins [place]   NORMAL LEXISCAN MYOVIEW/ EF 83%/  NO ISCHEMIA  . CO2  LASER APPLICATION  [id number]   Procedure: CO2 LASER APPLICATION;  Surgeon: Janie Morning, MD PHD;  Location: [facility];  Service: Gynecology;  Laterality: N/A;  Laser Vaporization  . CO2 LASER APPLICATION N/A [id number]   Procedure: CO2 LASER APPLICATION;  Surgeon: [name], MD;  Location: [facility];  Service: Gynecology;  Laterality: N/A;  . CO2 LASER APPLICATION N/A [id number]   Procedure: CO2 LASER APPLICATION of the vulva;  Surgeon: Janie Morning, MD;  Location[facility];  Service: Gynecology;  Laterality: N/A;  . COLONOSCOPY WITH PROPOFOL N/A 08/19/2019   Procedure: COLONOSCOPY WITH PROPOFOL;  Surgeon: [name], MD;  Location: [facility];  Service: Gastroenterology;  Laterality: N/A;  . ESOPHAGOGASTRODUODENOSCOPY (EGD) WITH PROPOFOL N/A 08/19/2019   Procedure: ESOPHAGOGASTRODUODENOSCOPY (EGD) WITH PROPOFOL;  Surgeon: [name], MD;  Location: [facility];  Service: Gastroenterology;  Laterality: N/A;  . EXCISION VULVAR LESIONS  07/2012  . HEMORRHOID SURGERY N/A 04/15/2013   Procedure:  Excision of external and internal  hemorrhoid with AIN, Exam under anesthesia;  Surgeon: Odis Hollingshead, MD;  Location: [facility];  Service: General;  Laterality: N/A;  . HIP PINNING,CANNULATED Right 03/15/2019   Procedure: Ma[place] OF FEMORAL NECK FRACTURE;  Surgeon: Renette Butters, MD;  Location: [facility];  Service: Orthopedics;  Laterality: Right;  . POLYPECTOMY  08/19/2019   Procedure: POLYPECTOMY;  Surgeon: [name], MD;  Location: [facility];  Service: Gastroenterology;;  . REPAIR FISTULA IN ANO/  I & D PERIRECTAL ABSCESS  10-26-2002  DR Johnathan Hausen  . TRANSTHORACIC ECHOCARDIOGRAM  06-23-2012   NORMAL LVSF/ EF 55-60%  . VAGINAL HYSTERECTOMY  1997   partial  . VULVAR LESION REMOVAL N/A 11/03/2014   Procedure: CO2 LASER OF VULVAR ;  Surgeon: [name], MD;  Location: [facility];  Service: Gynecology;  Laterality: N/A;  .  VULVECTOMY N/A 03/26/2015   Procedure: WIDE LOCA  EXCISION OF VULVAR;  Surgeon: [name], MD;  Location: [facility];  Service: Gynecology;  Laterality: N/A;  . Wide Local Excision of labia majora  04-04-2010   Right-sided lesion, CO2 ablation of right labia minora       History of present illness and  Hospital Course:     Kindly see H&P for history of present illness and admission details, please review complete Labs, Consult reports and Test reports for all details in brief  HPI  from the history and physical done on the day of admission 08/17/2019 HPI: Regina Ortiz is a 73 y.o. female with medical history significant of with medical history significant ofindwelling Foley since 05/10/2019 neurogenic bladder, herniated disks L4/L5 with cauda equina syndrome s/psurgical decompression on 01/28/2019 by Dr. Ellene Route, pertension, chronic hyponatremia, rheumatoid arthritis on chronic steroid, COPD, macrocytic chronic iron deficiency anemia, and GERD, chronic colitis presented with new onset diarrhea for 3 days. Had her Foley exchanged 2 weeks ago, developed UTI catheter associated 1 week ago.  She went to her urology and was started on Augmentin for 7 days which she completed yesterday.  She started to develop diarrhea about 3 days ago, became severe since yesterday.  Had a total of 5-6 episodes today, diarrhea has been dark-colored.  Also developed cramping like abdominal pain for 3 days, her abdominal pain is localized, 5-6/10 in severity, she could not eat since this morning because the abdominal pain.  Denied any nauseous vomiting, no chest pain no short of breath no cough, or dysuria.  She had 2 episodes of C. difficile colitis in her past, she reported 2 times, and the symptoms are different compared to this time. She also had had chronic colitis, she attributed that to her chronic steroid use.  She saidl usually flareup of her chronic colitis is just loose to watery diarrhea no  dark-colored. No known Covid contacts. ED Course: C diff was sent, ED physician Dr. Melina Copa talked to GI physician who recommended Hydration and possible colonoscope   Hospital Course    Acute diarrhea -Patient with history of collagenous colitis, and C. difficile in the past . -She is Hemoccult positive , no evidence of GI bleed on endoscopy/colonoscopy, Hemoccult positive most likely related to iron, H&H is stable. -C. difficile is negative . -Recently finished Augmentin, so induced antibiotic induced diarrhea versus collagenous colitis . -Status post endoscopy, significant for gastritis, biopsies were taken, rule out H. pylori, follow on  biopsy to rule out celiac disease.    Biopsy results can be followed as  an outpatient. -She was started on Protonix for her gastritis. -Colonoscopy with finding of polyps, which were removed, biopsies were sent as well.  Follow results as an outpatient  hyponatremia -Chronic, sodium around baseline, asymptomatic.  AKI,  - likelyprerenal from dehydration, resolved with IV fluid, back to baseline  UTI versus colonization. - she just completed treatment for UTI, she did finish her Augmentin. -Did change her Foley catheter given it was not changed after her recent diagnosis of UTI.  Urinary retention -Patient is following with Dr. Alyson Ingles as an outpatient, discussed with son, apparently she did fail  voiding trial in his office few weeks ago. -Attempting another voiding trial during hospital stay, Foley catheter was discontinued 12/19, she had couple episodes of retention on bladder scan every 8 hours, but none over last 36 hours, did not require any in and out over the last 36 hours as well, and she has been voiding with no retention, so she will be discharged home with no Foley catheter, home health has been consulted to arrange for visiting nurse to educate caregiver about in and outs in case it is needed, .  HTN -Blood pressure remains  uncontrolled, so her hydralazine has been increased to 50 mg oral 4 times daily .  Continue with home dose Cardizem.  Rheumatoid arthritis -continue home dosage of her steroid,no indication for stressing dose.  She is on both Solu-Cortef and prednisone  Hypokalemia -Repleted,  Coccyx pressure injury -Wound care consult appreciated  Iron deficiency anemia -Hemoglobin appears to be at baseline, continue to monitor closely, continue with iron supplements  Severe protein calorie malnutrition -Nutritionist consult greatly appreciated, and malnutrition in the setting of chronic illness related to colitis, continue supplements     Discharge Condition: stable   Follow UP  Follow-up Information    Maurice Small, MD Follow up in 1 week(s).   Specialty: Family Medicine Contact information: [address] [place] [address] [phone]             Discharge Instructions  and  Discharge Medications    Discharge Instructions    Discharge instructions   Complete by: As directed    Follow with Primary MD Maurice Small, MD in 7 days   Get CBC, CMP,  checked  by Primary MD next visit.    Activity: As tolerated with Full fall precautions use walker/cane & assistance as needed   Disposition Home    Diet: Regular diet.   On your next visit with your primary care physician please Get Medicines reviewed and adjusted.   Please request your Prim.MD to go over all Hospital Tests and Procedure/Radiological results at the follow up, please get all Hospital records sent to your Prim MD by signing hospital release before you go home.   If you experience worsening of your admission symptoms, develop shortness of breath, life threatening emergency, suicidal or homicidal thoughts you must seek medical attention immediately by calling 911 or calling your MD immediately  if symptoms less severe.  You Must read complete instructions/literature along with all  the possible adverse reactions/side effects for all the Medicines you take and that have been prescribed to you. Take any new Medicines after you have completely understood and accpet all the possible adverse reactions/side effects.   Do not drive, operating heavy machinery, perform activities at heights, swimming or participation in water activities or provide baby sitting services if your were admitted for syncope or siezures until you have seen by Primary MD  or a Neurologist and advised to do so again.  Do not drive when taking Pain medications.    Do not take more than prescribed Pain, Sleep and Anxiety Medications  Special Instructions: If you have smoked or chewed Tobacco  in the last 2 yrs please stop smoking, stop any regular Alcohol  and or any Recreational drug use.  Wear Seat belts while driving.   Please note  You were cared for by a hospitalist during your hospital stay. If you have any questions about your discharge medications or the care you received while you were in the hospital after you are discharged, you can call the unit and asked to speak with the hospitalist on call if the hospitalist that took care of you is not available. Once you are discharged, your primary care physician will handle any further medical issues. Please note that NO REFILLS for any discharge medications will be authorized once you are discharged, as it is imperative that you return to your primary care physician (or establish a relationship with a primary care physician if you do not have one) for your aftercare needs so that they can reassess your need for medications and monitor your lab values.   Increase activity slowly   Complete by: As directed      Allergies as of 08/22/2019      Reactions   Latex Swelling, Other (See Comments)    fever blisters   Augmentin [amoxicillin-pot Clavulanate] Nausea And Vomiting   Doxycycline Other (See Comments)   Blurred vision, nervous, unsteady   Lactose  Intolerance (gi) Other (See Comments)   Unknown, per pt   Lialda [mesalamine] Diarrhea   Prinivil [lisinopril] Diarrhea   Triamcinolone Acetonide Other (See Comments)   REDNESS AND PAIN   Amlodipine Rash   Lotensin [benazepril Hcl] Rash      Medication List    STOP taking these medications   atorvastatin 40 MG tablet Commonly known as: LIPITOR   carvedilol 6.25 MG tablet Commonly known as: Coreg   sulfamethoxazole-trimethoprim 800-160 MG tablet Commonly known as: BACTRIM DS     TAKE these medications   acetaminophen 325 MG tablet Commonly known as: TYLENOL Take 2 tablets (650 mg total) by mouth every 6 (six) hours as needed for mild pain. What changed:   medication strength  how much to take  when to take this  reasons to take this   alendronate 70 MG tablet Commonly known as: FOSAMAX Take 70 mg by mouth once a week. Mondays   busPIRone 30 MG tablet Commonly known as: BUSPAR Take 30 mg by mouth 2 (two) times daily.   CALTRATE 600+D PO Take 1 tablet by mouth every evening.   cholestyramine 4 g packet Commonly known as: QUESTRAN Take 1 packet (4 g total) by mouth 3 (three) times daily.   diltiazem 360 MG 24 hr capsule Commonly known as: TIAZAC Take 1 capsule (360 mg total) by mouth daily for 30 doses.   enoxaparin 40 MG/0.4ML injection Commonly known as: LOVENOX Inject 0.4 mLs (40 mg total) into the skin daily for 1 day. INJECT 0.4ML (40MG ) SUBCUTANEOUSLY ONCE DAILY FOR 1 MONTH FOR DVT PROPHYLAXIS   ferrous sulfate 325 (65 FE) MG tablet Take 1 tablet (325 mg total) by mouth daily for 30 doses. For Anemia What changed: when to take this   hydrALAZINE 50 MG tablet Commonly known as: APRESOLINE Take 1 tablet (50 mg total) by mouth 4 (four) times daily. What changed:   medication strength  how much to take  additional instructions   hydrocortisone 5 MG tablet Commonly known as: CORTEF Take 1 tablet (5 mg total) by mouth every evening. DX:  POSSIBLE ADRENAL INSUFFCIENCY   hydroxychloroquine 200 MG tablet Commonly known as: PLAQUENIL Take 200 mg by mouth every evening.   methocarbamol 500 MG tablet Commonly known as: ROBAXIN Take 1 tablet (500 mg total) by mouth 2 (two) times daily as needed for up to 20 doses for muscle spasms. For spasms   Nutritional Supplement Liqd Take 120 mLs by mouth 2 (two) times daily as needed. MedPass   pantoprazole 40 MG tablet Commonly known as: PROTONIX Take 1 tablet (40 mg total) by mouth daily. Start taking on: August 23, 2019   POTASSIUM PO Take 1 tablet by mouth daily.   predniSONE 5 MG tablet Commonly known as: DELTASONE Take 5 mg by mouth daily.   PRENATAL VITAMIN PO Take 1 tablet by mouth every evening.   VITAMIN D3 PO Take 1 tablet by mouth at bedtime.         Diet and Activity recommendation: See Discharge Instructions above   Consults obtained - GI   Major procedures and Radiology Reports - PLEASE review detailed and final reports for all details, in brief -   Colonoscopy  Findings:      The perianal and digital rectal examinations were normal.      Two sessile polyps were found in the transverse colon. The polyps were 4       to 9 mm in size. These polyps were removed with a hot snare and cold       biopsy forceps. Resection and retrieval were complete.      Two sessile polyps were found in the descending colon. The polyps were 4       to 9 mm in size. These polyps were removed with a hot snare and cold       biopsy forceps. Resection and retrieval were complete.      A few small-mouthed diverticula were found in the sigmoid colon and       descending colon.      A localized area of mildly erythematous, inflamed and nodular mucosa was       found in the cecum. Biopsies were taken with a cold forceps for       histology labelled as right random colon biopsies.      Biopsies for histology were taken with a cold forceps from the left       colon for  evaluation of microscopic colitis.      Non-bleeding internal hemorrhoids were found during retroflexion. The       hemorrhoids were small. Impression:               - Preparation of the colon was fair.                           - Two 4 to 9 mm polyps in the transverse colon,                            removed with a hot snare. Resected and retrieved.                           - Two 4 to 9 mm polyps in the descending colon,  removed with a hot snare. Resected and retrieved.                           - Diverticulosis in the sigmoid colon and in the                            descending colon.                           - Erythematous, inflamed and nodular mucosa in the                            cecum. Biopsied.                           - Non-bleeding internal hemorrhoids.                           - Biopsies were taken with a cold forceps from the                            left colon for evaluation of microscopic colitis. Moderate Sedation:      Patient did not receive moderate sedation for this procedure, but       instead received monitored anesthesia care. Recommendation:           - Low fiber diet.                           - Continue present medications.                           - Await pathology results.                           - Repeat colonoscopy for surveillance based on                            pathology results. Procedure Code(s):        --- Professional ---                           [phone], Colonoscopy, flexible; with removal of                            tumor(s), polyp(s), or other lesion(s) by snare                            technique                           [id number], 31, Colonoscopy, flexible; with biopsy,                            single or multiple Diagnosis Code(s):        --- Professional ---  K64.8, Other hemorrhoids                           K63.5, Polyp of colon                           K52.9,  Noninfective gastroenteritis and colitis,                            unspecified                           K63.89, Other specified diseases of intestine                           R19.5, Other fecal abnormalities                           K57.30, Diverticulosis of large intestine without                            perforation or abscess without bleeding CPT copyright 2019 American Medical Association. All rights reserved. The codes documented in this report are preliminary and upon coder review may  be revised to meet current compliance requirements. [name], MD 08/19/2019 10:38:03 AM Endoscopy        Show:Clear all [x] Manual[] Template[] Copied  Added by: [x] [name], MD  [] Hover for details EGD was performed for epigastric abdominal pain, anemia, FOBT positive stool, diarrhea and weight loss..   Findings: Normal esophagus, widely patent Schatzki's ring at GE junction, small hiatal hernia. Multiple dispersed small erosions in the gastric body, incisura antrum and pylorus. Moderate antral gastritis, biopsies taken for H. Pylori. Normal cardia and fundus on retroflexion. Normal-appearing duodenal bulb and duodenum, biopsies taken for celiac disease.   Colonoscopy was performed for chronic diarrhea, heme positive stool, history of collagenous colitis, last colonoscopy 5 years ago.  Findings: 2 polyps in the transverse colon, removed with cold biopsy forceps and hot snare polypectomy. 2 polyps in descending colon removed with hot snare and cold biopsy forceps. Few small diverticulosis in sigmoid and descending. Biopsies taken from right colon and left colon for evaluation for microscopic colitis. Small internal hemorrhoids.  Recommendation: Low fiber diet. Cholestyramine twice daily. May need budesonide if diarrhea is ongoing. Pathology may be followed as outpatient.   [name], MD            DG BONE DENSITY (DXA)  Result Date: 08/16/2019 EXAM:  DUAL X-RAY ABSORPTIOMETRY (DXA) FOR BONE MINERAL DENSITY IMPRESSION: Referring Physician:  Gavin Pound Your patient completed a BMD test using Lunar IDXA DXA system ( analysis version: 16 ) manufactured by [employer]. Technologist: CG PATIENT: Name: Regina Ortiz, Regina Ortiz Patient ID: [id number] Birth Date: [birth year] Height: 61.0 in. Sex: Female Measured: 08/16/2019 Weight: 87.8 lbs. Indications: Advanced Age, Caucasian, Desyrel, Estrogen Deficient, Hysterectomy, Long term steroid use, Prednisone, Prilosec, Right hip replacement, Wellbutrin Fractures: Rt hip Treatments: Calcium (E943.0), Fosamax, Vitamin D (E933.5) ASSESSMENT: The BMD measured at Femur Neck is 0.633 g/cm2 with a T-score of -2.9. This patient is considered osteoporotic according to [facility]) criteria. There has been a statistically significant decrease in BMD of Left hip since prior exam dated 08/12/2018. The scan quality is limited by patient condition. Lumbar  spine was not utilized due to exclusion on prior exam. Right femur was excluded due to surgical hardware. Site Region Measured Date Measured Age [age]A BMD Significant CHANGE T-score Left Femur Neck 08/16/2019 73.4 [age]2.8 0.633 g/cm2 * Left Femur Neck 08[age]12/2018 73.4 -2.3 0.718 g/cm2 Left Forearm Radius 33% 08/16/2019 74.4 -1.2 0.778 g/cm2 Left Forearm Radius 33% 08/12/2018 73.4 -1.1 0.792 g/cm2 World Health Organization [facility]) criteria for post-menopausal, Caucasian Women: Normal       T-score at or above -1 SD Osteopenia   T-score between -1 and -2.5 SD Osteoporosis T-score at or below -2.5 SD RECOMMENDATION: 1. All patients should optimize calcium and vitamin D intake. 2. Consider FDA approved medical therapies in postmenopausal women and men aged 52 years and older, based on the following: a. A hip or vertebral (clinical or morphometric) fracture b. T- score < or = -2.5 at the femoral neck or spine after appropriate evaluation to exclude secondary causes c. Low bone mass  (T-score between -1.0 and -2.5 at the femoral neck or spine) and a 10 year probability of a hip fracture > or = 3% or a 10 year probability of a major osteoporosis-related fracture > or = 20% based on the [place]-adapted WHO algorithm d. Clinician judgment and/or patient preferences may indicate treatment for people with 10-year fracture probabilities above or below these levels FOLLOW-UP: Patients with diagnosis of osteoporosis or at high risk for fracture should have regular bone mineral density tests. For patients eligible for Medicare, routine testing is allowed once every 2 years. The testing frequency can be increased to one year for patients who have rapidly progressing disease, those who are receiving or discontinuing medical therapy to restore bone mass, or have additional risk factors. [facility] Radiology Electronically Signed   By: Rolm Baptise M.D.   On: 08/16/2019 14:36   DG Abd Portable 1V  Result Date: 08/20/2019 CLINICAL DATA:  Abdominal pain.  Diarrhea EXAM: PORTABLE ABDOMEN - 1 VIEW COMPARISON:  02/15/2019 FINDINGS: Moderate gaseous distention of proximal colon. No abnormal stool retention. No small bowel dilatation, concerning mass effect, or concerning gas collection. Lung bases are clear. L4-5 fusion and right femoral neck repair. IMPRESSION: Moderate gaseous distention of the proximal colon with overall nonobstructive bowel gas pattern. Electronically Signed   By: Monte Fantasia M.D.   On: 08/20/2019 11:39    Micro Results     Recent Results (from the past 240 hour(s))  SARS CORONAVIRUS 2 (TAT 6-24 HRS) Nasopharyngeal Nasopharyngeal Swab     Status: None   Collection Time: 08/17/19  7:01 PM   Specimen: Nasopharyngeal Swab  Result Value Ref Range Status   SARS Coronavirus 2 NEGATIVE NEGATIVE Final    Comment: (NOTE) SARS-CoV-2 target nucleic acids are NOT DETECTED. The SARS-CoV-2 RNA is generally detectable in upper and lower respiratory specimens during the acute phase of  infection. Negative results do not preclude SARS-CoV-2 infection, do not rule out co-infections with other pathogens, and should not be used as the sole basis for treatment or other patient management decisions. Negative results must be combined with clinical observations, patient history, and epidemiological information. The expected result is Negative. Fact Sheet for Patients: SugarRoll.be Fact Sheet for Healthcare Providers: https://www.woods-mathews.com/ This test is not yet approved or cleared by the [place] FDA and  has been authorized for detection and/or diagnosis of SARS-CoV-2 by FDA under an Emergency Use Authorization (EUA). This EUA will remain  in effect (meaning this test can be used) for the duration of the COVID-19  declaration under Section 56 4(b)(1) of the Act, 21 U.S.C. section 360bbb-3(b)(1), unless the authorization is terminated or revoked sooner. Performed at [facility], [place] [address] [place], [place] [address]   C Difficile Quick Screen w PCR reflex     Status: None   Collection Time: 08/18/19  3:44 AM   Specimen: STOOL  Result Value Ref Range Status   C Diff antigen NEGATIVE NEGATIVE Final   C Diff toxin NEGATIVE NEGATIVE Final   C Diff interpretation No C. difficile detected.  Final    Comment: Performed at [facility], [place] [address], [place], [place] [address]       Today   Subjective:   Regina Ortiz today reports her abdominal pain has improved, and her appetite has improved, still reports some loose bowel movement, patient did not require in and out over last 36 hours.  Objective:   Blood pressure (!) 163/78, pulse 87, temperature 98.5 F (36.9 C), temperature source Oral, resp. rate 18, height 5\' 4"  (1.626 m), weight 44.9 kg, SpO2 99 %.   Intake/Output Summary (Last 24 hours) at 08/22/2019 1145 Last data filed at 08/22/2019 1100 Gross per 24 hour  Intake 480 ml  Output 1901  ml  Net -1421 ml    Exam Awake Alert, Oriented x 3, No new F.N deficits, Normal affect, frail Symmetrical Chest wall movement, Good air movement bilaterally, CTAB RRR,No Gallops,Rubs or new Murmurs, No Parasternal Heave +ve B.Sounds, Abd Soft, Non tender,  No rebound -guarding or rigidity. No Cyanosis, Clubbing or edema, No new Rash or bruise  Data Review   CBC w Diff:  Lab Results  Component Value Date   WBC 6.9 08/21/2019   HGB 9.7 (L) 08/21/2019   HCT 26.1 (L) 08/21/2019   PLT 232 08/21/2019   LYMPHOPCT 6 08/17/2019   MONOPCT 4 08/17/2019   EOSPCT 0 08/17/2019   BASOPCT 0 08/17/2019    CMP:  Lab Results  Component Value Date   NA 126 (L) 08/21/2019   NA 129 (A) 04/12/2019   K 3.3 (L) 08/21/2019   CL 97 (L) 08/21/2019   CO2 20 (L) 08/21/2019   BUN 7 (L) 08/21/2019   BUN 18 04/12/2019   CREATININE 0.78 08/21/2019   GLU 117 04/12/2019   PROT 6.5 08/17/2019   ALBUMIN 3.6 08/17/2019   BILITOT 0.5 08/17/2019   ALKPHOS 41 08/17/2019   AST 23 08/17/2019   ALT 26 08/17/2019  .   Total Time in preparing paper work, data evaluation and todays exam - 29 minutes  Phillips Climes M.D on 08/22/2019 at 11:45 AM  [facility]   Office  [phone]

## 2019-08-22 NOTE — TOC Initial Note (Addendum)
Transition of Care [facility]) - Initial/Assessment Note    Patient Details  Name: Regina Ortiz MRN: [id number] Date of Birth: July 09, 1946  Transition of Care [facility]) CM/SW Contact:    Bartholomew Crews, RN Phone Number: [phone] 08/22/2019, 1:22 PM  Clinical Narrative:                 Received call from patient's cousin, Vergia Alberts, who expressed concerns that patient needed a few days of rehab before returning home. Spoke with patient at the bedside to discuss discharge needs. Patient states that she has 2 private pay independent caregivers who are with her from 9-5 7 days a week. The caregivers assist with meal prep, light cleaning, adls, and transportation to medical appointments and pharmacy. Patient states her caregiver will pick her up from the hospital at discharge.   Patient states that she is comfortable being home alone at night. She states that her cousin, Romie Minus, lives next door, and she also has a neighbor who offers assistance when needed.   States that her son lives in [place],  and he and his wife both work full time.   Patient states that [facility] was to start on Saturday, but appointment was canceled d/t hospitalization. MD orders with Face to Face provided for RN and PT. Spoke with liaison at [facility] who will continue [facility] following.   Patient has a RW at home.   Verified pharmacy as [facility] on [address].   Patient is followed by [facility]. Notified [facility] liaison of planned discharge.   TOC team following for transition needs.   Expected Discharge Plan: [place] Barriers to Discharge: No Barriers Identified   Patient Goals and CMS Choice Patient states their goals for this hospitalization and ongoing recovery are:: return to her home CMS Medicare.gov Compare Post Acute Care list provided to:: Patient Choice offered to / list presented to : Patient  Expected Discharge Plan and Services Expected Discharge Plan: [place] In-house  Referral: [facility] Discharge Planning Services: CM Consult Post Acute Care Choice: [place] arrangements for the past 2 months: Single Family Home Expected Discharge Date: 08/22/19               DME Arranged: N/A DME Agency: NA       HH Arranged: RN, PT HH Agency: [facility] Date [facility] Agency Contacted: 08/22/19 Time HH Agency Contacted: 75 Representative spoke with at [place]: [place]  Prior Living Arrangements/Services Living arrangements for the past 2 months: [place] with:: Self Patient language and need for interpreter reviewed:: Yes Do you feel safe going back to the place where you live?: Yes          Current home services: DME, Home RN, Home PT, Other (comment)(private pay independent caregivers) Criminal Activity/Legal Involvement Pertinent to Current Situation/Hospitalization: No - Comment as needed  Activities of Daily Living Home Assistive Devices/Equipment: Environmental consultant (specify type) ADL Screening (condition at time of admission) Patient's cognitive ability adequate to safely complete daily activities?: Yes Is the patient deaf or have difficulty hearing?: No Does the patient have difficulty seeing, even when wearing glasses/contacts?: No Does the patient have difficulty concentrating, remembering, or making decisions?: No Patient able to express need for assistance with ADLs?: Yes Does the patient have difficulty dressing or bathing?: Yes Independently performs ADLs?: Yes (appropriate for developmental age) Does the patient have difficulty walking or climbing stairs?: Yes Weakness of Legs: Both Weakness of Arms/Hands: Both  Permission Sought/Granted                  Emotional Assessment Appearance:: Appears stated age Attitude/Demeanor/Rapport: Engaged Affect (typically observed): Accepting Orientation: : Oriented to Self, Oriented to  Time, Oriented to Place, Oriented to Situation Alcohol / Substance Use: Not Applicable Psych  Involvement: No (comment)  Admission diagnosis:  Diarrhea [R19.7] Rectal bleeding [K62.5] Weakness [R53.1] AKI (acute kidney injury) ([place]) [N17.9] Patient Active Problem List   Diagnosis Date Noted  . Protein-calorie malnutrition, severe 08/19/2019  . Urinary frequency 04/15/2019  . Osteoporosis   . Hyperlipidemia   . Adrenal insufficiency ([place]) 04/13/2019  . Uncontrolled hypertension 04/13/2019  . Bilateral lower extremity edema 04/13/2019  . UTI (urinary tract infection) 04/02/2019  . Pain on right lower extremity 03/31/2019  . Prolonged Q-T interval on ECG 03/22/2019  . Acute blood loss anemia 03/21/2019  . Pressure injury of skin 03/15/2019  . Fracture of femoral neck, right, closed ([place]) 03/14/2019  . Fall at home, initial encounter 03/12/2019  . Urinary retention 02/15/2019  . Lumbosacral radiculopathy 02/15/2019  . Constipation 02/15/2019  . Acute urinary retention 02/15/2019  . Cauda equina syndrome ([place]) 01/28/2019  . Chest pain 09/25/2017  . Hypertensive urgency 09/24/2017  . Headache 09/24/2017  . Alcohol use 09/24/2017  . AKI (acute kidney injury) ([place]) 09/24/2017  . Bilateral carotid artery stenosis   . Vulvovaginal candidiasis 10/22/2015  . Mild malnutrition ([place]) 07/07/2013  . Chronic bronchitis ([place]) 07/07/2013  . Tobacco abuse 07/07/2013  . Hypertension   . Short of breath on exertion   . Hemorrhoid   . LBBB (left bundle branch block)   . Anxiety   . Depression   . RA (rheumatoid arthritis) ([place])   . Venous insufficiency of leg   . Chronic hyponatremia   . AIN grade I 04/05/2013  . Diarrhea 09/22/2012  . Hyponatremia 09/22/2012  . Hypokalemia 09/22/2012  . ARF (acute renal failure) ([place]) 09/22/2012  . Atypical chest pain 06/22/2012  . Vulvar intraepithelial neoplasia III (VIN III) 01/29/2012   PCP:  Maurice Small, MD Pharmacy:   [place], [place] - [address] [place]  [address] Phone: [phone] Fax: [facility], [employer] [address] [place] [address] Phone: [phone] Fax: [place], [place] [address] Phone: [phone] Fax: [phone]     Social Determinants of Health (SDOH) Interventions    Readmission Risk Interventions Readmission Risk Prevention Plan 02/22/2019 02/16/2019  Transportation Screening Complete Complete  PCP or Specialist Appt within 3-5 Days - Complete  HRI or [place] - Complete  Social Work Consult for [place] Planning/Counseling - Complete  Palliative Care Screening - Not Applicable  Medication Review Press photographer) Complete Complete  PCP or Specialist appointment within 3-5 days of discharge Complete -  [place] or Home Care Consult Complete -  SW Recovery Care/Counseling Consult Complete -  Palliative Care Screening Not Applicable -  [place] Not Applicable -  Some recent data might be hidden

## 2019-08-22 NOTE — Discharge Instructions (Signed)
Follow with Primary MD Maurice Small, MD in 7 days   Get CBC, CMP,  checked  by Primary MD next visit.    Activity: As tolerated with Full fall precautions use walker/cane & assistance as needed   Disposition Home    Diet: Regular diet.   On your next visit with your primary care physician please Get Medicines reviewed and adjusted.   Please request your Prim.MD to go over all Hospital Tests and Procedure/Radiological results at the follow up, please get all Hospital records sent to your Prim MD by signing hospital release before you go home.   If you experience worsening of your admission symptoms, develop shortness of breath, life threatening emergency, suicidal or homicidal thoughts you must seek medical attention immediately by calling 911 or calling your MD immediately  if symptoms less severe.  You Must read complete instructions/literature along with all the possible adverse reactions/side effects for all the Medicines you take and that have been prescribed to you. Take any new Medicines after you have completely understood and accpet all the possible adverse reactions/side effects.   Do not drive, operating heavy machinery, perform activities at heights, swimming or participation in water activities or provide baby sitting services if your were admitted for syncope or siezures until you have seen by Primary MD or a Neurologist and advised to do so again.  Do not drive when taking Pain medications.    Do not take more than prescribed Pain, Sleep and Anxiety Medications  Special Instructions: If you have smoked or chewed Tobacco  in the last 2 yrs please stop smoking, stop any regular Alcohol  and or any Recreational drug use.  Wear Seat belts while driving.   Please note  You were cared for by a hospitalist during your hospital stay. If you have any questions about your discharge medications or the care you received while you were in the hospital after you are discharged,  you can call the unit and asked to speak with the hospitalist on call if the hospitalist that took care of you is not available. Once you are discharged, your primary care physician will handle any further medical issues. Please note that NO REFILLS for any discharge medications will be authorized once you are discharged, as it is imperative that you return to your primary care physician (or establish a relationship with a primary care physician if you do not have one) for your aftercare needs so that they can reassess your need for medications and monitor your lab values.

## 2019-08-22 NOTE — Consult Note (Signed)
   [facility] Inpatient Consult   08/22/2019  PRESLI HEPPLER [birth year] [id number]   Referral:  Inpatient Transition Of Care RNCM  Received a call from [facility], inpatient [facility] nurse to see if [facility] could follow this patient post hospital needs.  Did not review medical record for [facility], as this patient is in [place]  however, her care management needs will be followed by [facility] for these needs.  Plan: Will sign off as [facility] will follow for disposition/needs.  For questions, please contact:  Natividad Brood, RN BSN [facility] Liaison  [phone] business mobile phone Toll free office [phone]  Fax number: [phone] [email or web address]

## 2019-08-22 NOTE — Plan of Care (Signed)
  Problem: Education: Goal: Knowledge of General Education information will improve Description: Including pain rating scale, medication(s)/side effects and non-pharmacologic comfort measures Outcome: Progressing   Problem: Elimination: Goal: Will not experience complications related to urinary retention Outcome: Progressing   

## 2019-08-23 DIAGNOSIS — E785 Hyperlipidemia, unspecified: Secondary | ICD-10-CM | POA: Diagnosis not present

## 2019-08-23 DIAGNOSIS — M5136 Other intervertebral disc degeneration, lumbar region: Secondary | ICD-10-CM | POA: Diagnosis not present

## 2019-08-23 DIAGNOSIS — K5900 Constipation, unspecified: Secondary | ICD-10-CM | POA: Diagnosis not present

## 2019-08-23 DIAGNOSIS — I10 Essential (primary) hypertension: Secondary | ICD-10-CM | POA: Diagnosis not present

## 2019-08-23 DIAGNOSIS — R339 Retention of urine, unspecified: Secondary | ICD-10-CM | POA: Diagnosis not present

## 2019-08-23 DIAGNOSIS — Z4800 Encounter for change or removal of nonsurgical wound dressing: Secondary | ICD-10-CM | POA: Diagnosis not present

## 2019-08-23 DIAGNOSIS — K219 Gastro-esophageal reflux disease without esophagitis: Secondary | ICD-10-CM | POA: Diagnosis not present

## 2019-08-23 DIAGNOSIS — F411 Generalized anxiety disorder: Secondary | ICD-10-CM | POA: Diagnosis not present

## 2019-08-23 DIAGNOSIS — F17200 Nicotine dependence, unspecified, uncomplicated: Secondary | ICD-10-CM | POA: Diagnosis not present

## 2019-08-23 DIAGNOSIS — I447 Left bundle-branch block, unspecified: Secondary | ICD-10-CM | POA: Diagnosis not present

## 2019-08-23 DIAGNOSIS — M810 Age-related osteoporosis without current pathological fracture: Secondary | ICD-10-CM | POA: Diagnosis not present

## 2019-08-23 DIAGNOSIS — M5417 Radiculopathy, lumbosacral region: Secondary | ICD-10-CM | POA: Diagnosis not present

## 2019-08-23 DIAGNOSIS — I6523 Occlusion and stenosis of bilateral carotid arteries: Secondary | ICD-10-CM | POA: Diagnosis not present

## 2019-08-23 DIAGNOSIS — M069 Rheumatoid arthritis, unspecified: Secondary | ICD-10-CM | POA: Diagnosis not present

## 2019-08-23 DIAGNOSIS — F331 Major depressive disorder, recurrent, moderate: Secondary | ICD-10-CM | POA: Diagnosis not present

## 2019-08-23 DIAGNOSIS — R32 Unspecified urinary incontinence: Secondary | ICD-10-CM | POA: Diagnosis not present

## 2019-08-23 DIAGNOSIS — M1990 Unspecified osteoarthritis, unspecified site: Secondary | ICD-10-CM | POA: Diagnosis not present

## 2019-08-23 DIAGNOSIS — I7300 Raynaud's syndrome without gangrene: Secondary | ICD-10-CM | POA: Diagnosis not present

## 2019-08-23 DIAGNOSIS — I872 Venous insufficiency (chronic) (peripheral): Secondary | ICD-10-CM | POA: Diagnosis not present

## 2019-08-23 DIAGNOSIS — L89152 Pressure ulcer of sacral region, stage 2: Secondary | ICD-10-CM | POA: Diagnosis not present

## 2019-08-23 DIAGNOSIS — G4700 Insomnia, unspecified: Secondary | ICD-10-CM | POA: Diagnosis not present

## 2019-08-23 DIAGNOSIS — E538 Deficiency of other specified B group vitamins: Secondary | ICD-10-CM | POA: Diagnosis not present

## 2019-08-23 DIAGNOSIS — J449 Chronic obstructive pulmonary disease, unspecified: Secondary | ICD-10-CM | POA: Diagnosis not present

## 2019-08-23 DIAGNOSIS — G834 Cauda equina syndrome: Secondary | ICD-10-CM | POA: Diagnosis not present

## 2019-08-23 DIAGNOSIS — E43 Unspecified severe protein-calorie malnutrition: Secondary | ICD-10-CM | POA: Diagnosis not present

## 2019-08-23 MED ORDER — LOPERAMIDE HCL 2 MG PO CAPS: 2.0000 mg | ORAL_CAPSULE | Freq: Once | ORAL | Status: DC

## 2019-08-23 NOTE — Plan of Care (Signed)

## 2019-08-23 NOTE — Plan of Care (Signed)
  Problem: Activity: Goal: Risk for activity intolerance will decrease Outcome: Progressing   Problem: Elimination: Goal: Will not experience complications related to bowel motility Outcome: Progressing   Problem: Elimination: Goal: Will not experience complications related to urinary retention Outcome: Progressing   Problem: Elimination: Goal: Will not experience complications related to bowel motility Outcome: Progressing Goal: Will not experience complications related to urinary retention Outcome: Progressing

## 2019-08-23 NOTE — TOC Transition Note (Signed)
Transition of Care [facility]) - CM/SW Discharge Note   Patient Details  Name: Regina Ortiz MRN: [id number] Date of Birth: [birth year]  Transition [facility]) CM/SW Contact:  Bartholomew Crews, RN Phone Number: [phone] 08/23/2019, 10:37 AM   Clinical Narrative:    Received message from PT that patient family was wanting patient to go to rehab d/t no one with her at night. Noted in PT note that patient is weak and lack endurance, but is able to ambulate 200' with RW. NCM and CSW went to discuss situation with patient, however, patient was being escorted in wheelchair to main entrance for discharge. [facility] that patient was discharged today - [facility] to f/u with skilled nurse and PT. No further transition of care needs identified.    Final next level of care: [place] Barriers to Discharge: No Barriers Identified   Patient Goals and CMS Choice Patient states their goals for this hospitalization and ongoing recovery are:: return to her home CMS Medicare.gov Compare Post Acute Care list provided to:: Patient Choice offered to / list presented to : Patient  Discharge Placement                       Discharge Plan and Services In-house Referral: [facility] Discharge Planning Services: CM Consult Post Acute Care Choice: Home Health          DME Arranged: N/A DME Agency: NA       HH Arranged: RN, PT HH Agency: [facility] Date [place]: 08/23/19 Time [place]: [id number] Representative spoke with at [place]: [place] ([place]) Interventions     Readmission Risk Interventions Readmission Risk Prevention Plan 02/22/2019 02/16/2019  Transportation Screening Complete Complete  PCP or Specialist Appt within 3-5 Days - Complete  HRI or [place] - Complete  Social Work Consult for [place] Planning/Counseling - Complete  Palliative Care Screening - Not Applicable  Medication Review Human resources officer) Complete Complete  PCP or Specialist appointment within 3-5 days of discharge Complete -  [place] or Home Care Consult Complete -  SW Recovery Care/Counseling Consult Complete -  Palliative Care Screening Not Applicable -  [place] Not Applicable -  Some recent data might be hidden

## 2019-08-23 NOTE — Progress Notes (Addendum)
DISCHARGE NOTE   Regina Ortiz to be discharged Home per MD order. Patient verbalized understanding.  Skin clean, dry and intact without evidence of skin break down, no evidence of skin tears noted. IV catheter discontinued intact. Site without signs and symptoms of complications. Dressing and pressure applied. Pt denies pain at the site currently. No complaints noted.  Patient free of lines, drains, and wounds.   Discharge packet assembled. An After Visit Summary (AVS) was printed and given to the patientl. Patient escorted via wheelchair and discharged to designated caretaker via private transportation. All questions and concerns addressed.   Dolores Hoose, RN 2

## 2019-08-23 NOTE — Progress Notes (Signed)
Pt up to bedside commode by herself numerous times ,pt up with the walker moving around in her room.pt also  ambulate in the hall by herself around shift change at [facility] last night  and ambulate in the hall way by herself with the NT a second time around 0100 am no complaints of SOB pt upset that she did not go home yesterday pt stated" My son is overprotected of me and means no harm but I"m ready to go home" will continue to monitor.

## 2019-08-23 NOTE — Progress Notes (Signed)
Physical Therapy Treatment Patient Details Name: Regina Ortiz MRN: [id number] DOB: [birth year] Today's Date: 08/23/2019    History of Present Illness 73 yo female with onset of UTI a week before admission, from indwelling foley, was admitted for diarrhea, abd pain and for receiving EGD and colonoscopy.  Had some GI bleeding with no obvious sighting, has AKI and urinary retention from chronic condition.  PMHx:  c-diff, HTN, cauda equina syndrome, RA, coccyx wound, malnutrition, skin breakdown, urinary retention with chronic foley use    PT Comments    Patient received in bed. Reports she is anxious about a lot of things that are happening right now. Agrees to PT session. Patient performed bed mobility with supervision. Use of bed rail and increased time. Performed sit to stand transfer with min guard/supervision. Able to ambulate with RW and min guard 200 feet. Fatigued with this. No LOB. Patient will continue to benefit from skilled PT while here to improve functional mobility and strength to return home safely. Family reports she has no one to stay with her at night and will be alone from 5pm to 9am. Patient is weak and would benefit from SNF for supervision with OOB mobility especially at night for safety. She has caregiver for a few hours during the day. Family unable to provide needed assist at night.      Follow Up Recommendations  SNF;Supervision for mobility/OOB     Equipment Recommendations  None recommended by PT    Recommendations for Other Services       Precautions / Restrictions Precautions Precautions: Fall Restrictions Weight Bearing Restrictions: No    Mobility  Bed Mobility Overal bed mobility: Needs Assistance Bed Mobility: Supine to Sit;Sit to Supine     Supine to sit: Supervision Sit to supine: Supervision   General bed mobility comments: pt has HOB elevated and using bedrail  Transfers Overall transfer level: Needs assistance Equipment used: Rolling  walker (2 wheeled) Transfers: Sit to/from Stand Sit to Stand: Supervision         General transfer comment: cues for taking her time and standing for prior to atempting to walk.  Ambulation/Gait Ambulation/Gait assistance: Min guard Gait Distance (Feet): 200 Feet Assistive device: Rolling walker (2 wheeled) Gait Pattern/deviations: Step-through pattern;Decreased stride length;Shuffle Gait velocity: slightly decreased   General Gait Details: pt is able to maneuver walker with no help,    Stairs             Wheelchair Mobility    Modified Rankin (Stroke Patients Only)       Balance Overall balance assessment: Needs assistance Sitting-balance support: Feet supported Sitting balance-Leahy Scale: Good     Standing balance support: Bilateral upper extremity supported;During functional activity Standing balance-Leahy Scale: Fair Standing balance comment: requires walker for dynamic balance                            Cognition Arousal/Alertness: Awake/alert Behavior During Therapy: WFL for tasks assessed/performed Overall Cognitive Status: Within Functional Limits for tasks assessed                                 General Comments: pt is able to give reasonable history of home      Exercises      General Comments        Pertinent Vitals/Pain Pain Assessment: Faces Faces Pain Scale: Hurts little more Pain Location: bottom  due to sores Pain Descriptors / Indicators: Grimacing;Sore Pain Intervention(s): Repositioned;Monitored during session    Home Living                      Prior Function            PT Goals (current goals can now be found in the care plan section) Acute Rehab PT Goals Patient Stated Goal: to get stronger PT Goal Formulation: With patient Time For Goal Achievement: 09/03/19 Potential to Achieve Goals: Good Progress towards PT goals: Progressing toward goals    Frequency    Min  3X/week      PT Plan Discharge plan needs to be updated    Co-evaluation              AM-PAC PT "6 Clicks" Mobility   Outcome Measure  Help needed turning from your back to your side while in a flat bed without using bedrails?: None Help needed moving from lying on your back to sitting on the side of a flat bed without using bedrails?: None Help needed moving to and from a bed to a chair (including a wheelchair)?: A Little Help needed standing up from a chair using your arms (e.g., wheelchair or bedside chair)?: A Little Help needed to walk in hospital room?: A Little Help needed climbing 3-5 steps with a railing? : A Little 6 Click Score: 20    End of Session Equipment Utilized During Treatment: Gait belt Activity Tolerance: Patient tolerated treatment well;Patient limited by fatigue Patient left: in bed;with call bell/phone within reach Nurse Communication: Mobility status PT Visit Diagnosis: Muscle weakness (generalized) (M62.81) Pain - part of body: (bottom)     Time: [id number] PT Time Calculation (min) (ACUTE ONLY): 18 min  Charges:  $Gait Training: 8-22 mins                     [employer], PT, GCS 08/23/19,10:01 AM

## 2019-08-29 DIAGNOSIS — Z79899 Other long term (current) drug therapy: Secondary | ICD-10-CM | POA: Diagnosis not present

## 2019-08-29 DIAGNOSIS — E538 Deficiency of other specified B group vitamins: Secondary | ICD-10-CM | POA: Diagnosis not present

## 2019-08-29 DIAGNOSIS — I959 Hypotension, unspecified: Secondary | ICD-10-CM | POA: Diagnosis not present

## 2019-08-29 DIAGNOSIS — L89151 Pressure ulcer of sacral region, stage 1: Secondary | ICD-10-CM | POA: Diagnosis not present

## 2019-08-30 ENCOUNTER — Other Ambulatory Visit: Payer: Self-pay | Admitting: Adult Health

## 2019-08-30 DIAGNOSIS — M8000XD Age-related osteoporosis with current pathological fracture, unspecified site, subsequent encounter for fracture with routine healing: Secondary | ICD-10-CM | POA: Diagnosis not present

## 2019-08-31 DIAGNOSIS — M5136 Other intervertebral disc degeneration, lumbar region: Secondary | ICD-10-CM | POA: Diagnosis not present

## 2019-08-31 DIAGNOSIS — E43 Unspecified severe protein-calorie malnutrition: Secondary | ICD-10-CM | POA: Diagnosis not present

## 2019-08-31 DIAGNOSIS — R32 Unspecified urinary incontinence: Secondary | ICD-10-CM | POA: Diagnosis not present

## 2019-08-31 DIAGNOSIS — G834 Cauda equina syndrome: Secondary | ICD-10-CM | POA: Diagnosis not present

## 2019-08-31 DIAGNOSIS — L89152 Pressure ulcer of sacral region, stage 2: Secondary | ICD-10-CM | POA: Diagnosis not present

## 2019-08-31 DIAGNOSIS — M5417 Radiculopathy, lumbosacral region: Secondary | ICD-10-CM | POA: Diagnosis not present

## 2019-08-31 DIAGNOSIS — F411 Generalized anxiety disorder: Secondary | ICD-10-CM | POA: Diagnosis not present

## 2019-08-31 DIAGNOSIS — E785 Hyperlipidemia, unspecified: Secondary | ICD-10-CM | POA: Diagnosis not present

## 2019-08-31 DIAGNOSIS — R339 Retention of urine, unspecified: Secondary | ICD-10-CM | POA: Diagnosis not present

## 2019-08-31 DIAGNOSIS — Z4800 Encounter for change or removal of nonsurgical wound dressing: Secondary | ICD-10-CM | POA: Diagnosis not present

## 2019-08-31 DIAGNOSIS — M810 Age-related osteoporosis without current pathological fracture: Secondary | ICD-10-CM | POA: Diagnosis not present

## 2019-08-31 DIAGNOSIS — F17200 Nicotine dependence, unspecified, uncomplicated: Secondary | ICD-10-CM | POA: Diagnosis not present

## 2019-08-31 DIAGNOSIS — I10 Essential (primary) hypertension: Secondary | ICD-10-CM | POA: Diagnosis not present

## 2019-08-31 DIAGNOSIS — F331 Major depressive disorder, recurrent, moderate: Secondary | ICD-10-CM | POA: Diagnosis not present

## 2019-08-31 DIAGNOSIS — M069 Rheumatoid arthritis, unspecified: Secondary | ICD-10-CM | POA: Diagnosis not present

## 2019-08-31 DIAGNOSIS — I7300 Raynaud's syndrome without gangrene: Secondary | ICD-10-CM | POA: Diagnosis not present

## 2019-08-31 DIAGNOSIS — M1990 Unspecified osteoarthritis, unspecified site: Secondary | ICD-10-CM | POA: Diagnosis not present

## 2019-08-31 DIAGNOSIS — G4700 Insomnia, unspecified: Secondary | ICD-10-CM | POA: Diagnosis not present

## 2019-08-31 DIAGNOSIS — I872 Venous insufficiency (chronic) (peripheral): Secondary | ICD-10-CM | POA: Diagnosis not present

## 2019-08-31 DIAGNOSIS — J449 Chronic obstructive pulmonary disease, unspecified: Secondary | ICD-10-CM | POA: Diagnosis not present

## 2019-08-31 DIAGNOSIS — E538 Deficiency of other specified B group vitamins: Secondary | ICD-10-CM | POA: Diagnosis not present

## 2019-08-31 DIAGNOSIS — I447 Left bundle-branch block, unspecified: Secondary | ICD-10-CM | POA: Diagnosis not present

## 2019-08-31 DIAGNOSIS — K219 Gastro-esophageal reflux disease without esophagitis: Secondary | ICD-10-CM | POA: Diagnosis not present

## 2019-08-31 DIAGNOSIS — K5900 Constipation, unspecified: Secondary | ICD-10-CM | POA: Diagnosis not present

## 2019-08-31 DIAGNOSIS — I6523 Occlusion and stenosis of bilateral carotid arteries: Secondary | ICD-10-CM | POA: Diagnosis not present

## 2019-09-07 DIAGNOSIS — F17200 Nicotine dependence, unspecified, uncomplicated: Secondary | ICD-10-CM | POA: Diagnosis not present

## 2019-09-07 DIAGNOSIS — E785 Hyperlipidemia, unspecified: Secondary | ICD-10-CM | POA: Diagnosis not present

## 2019-09-07 DIAGNOSIS — L89152 Pressure ulcer of sacral region, stage 2: Secondary | ICD-10-CM | POA: Diagnosis not present

## 2019-09-07 DIAGNOSIS — I6523 Occlusion and stenosis of bilateral carotid arteries: Secondary | ICD-10-CM | POA: Diagnosis not present

## 2019-09-07 DIAGNOSIS — G834 Cauda equina syndrome: Secondary | ICD-10-CM | POA: Diagnosis not present

## 2019-09-07 DIAGNOSIS — J449 Chronic obstructive pulmonary disease, unspecified: Secondary | ICD-10-CM | POA: Diagnosis not present

## 2019-09-07 DIAGNOSIS — Z4800 Encounter for change or removal of nonsurgical wound dressing: Secondary | ICD-10-CM | POA: Diagnosis not present

## 2019-09-07 DIAGNOSIS — I7300 Raynaud's syndrome without gangrene: Secondary | ICD-10-CM | POA: Diagnosis not present

## 2019-09-07 DIAGNOSIS — F331 Major depressive disorder, recurrent, moderate: Secondary | ICD-10-CM | POA: Diagnosis not present

## 2019-09-07 DIAGNOSIS — F411 Generalized anxiety disorder: Secondary | ICD-10-CM | POA: Diagnosis not present

## 2019-09-07 DIAGNOSIS — R32 Unspecified urinary incontinence: Secondary | ICD-10-CM | POA: Diagnosis not present

## 2019-09-07 DIAGNOSIS — I447 Left bundle-branch block, unspecified: Secondary | ICD-10-CM | POA: Diagnosis not present

## 2019-09-07 DIAGNOSIS — K219 Gastro-esophageal reflux disease without esophagitis: Secondary | ICD-10-CM | POA: Diagnosis not present

## 2019-09-07 DIAGNOSIS — M810 Age-related osteoporosis without current pathological fracture: Secondary | ICD-10-CM | POA: Diagnosis not present

## 2019-09-07 DIAGNOSIS — I872 Venous insufficiency (chronic) (peripheral): Secondary | ICD-10-CM | POA: Diagnosis not present

## 2019-09-07 DIAGNOSIS — I10 Essential (primary) hypertension: Secondary | ICD-10-CM | POA: Diagnosis not present

## 2019-09-07 DIAGNOSIS — G4700 Insomnia, unspecified: Secondary | ICD-10-CM | POA: Diagnosis not present

## 2019-09-07 DIAGNOSIS — E538 Deficiency of other specified B group vitamins: Secondary | ICD-10-CM | POA: Diagnosis not present

## 2019-09-07 DIAGNOSIS — E43 Unspecified severe protein-calorie malnutrition: Secondary | ICD-10-CM | POA: Diagnosis not present

## 2019-09-07 DIAGNOSIS — R339 Retention of urine, unspecified: Secondary | ICD-10-CM | POA: Diagnosis not present

## 2019-09-07 DIAGNOSIS — M5417 Radiculopathy, lumbosacral region: Secondary | ICD-10-CM | POA: Diagnosis not present

## 2019-09-07 DIAGNOSIS — K5900 Constipation, unspecified: Secondary | ICD-10-CM | POA: Diagnosis not present

## 2019-09-07 DIAGNOSIS — M5136 Other intervertebral disc degeneration, lumbar region: Secondary | ICD-10-CM | POA: Diagnosis not present

## 2019-09-07 DIAGNOSIS — M069 Rheumatoid arthritis, unspecified: Secondary | ICD-10-CM | POA: Diagnosis not present

## 2019-09-07 DIAGNOSIS — M1990 Unspecified osteoarthritis, unspecified site: Secondary | ICD-10-CM | POA: Diagnosis not present

## 2019-09-20 ENCOUNTER — Other Ambulatory Visit: Payer: Self-pay

## 2019-09-20 ENCOUNTER — Ambulatory Visit
Admission: RE | Admit: 2019-09-20 | Discharge: 2019-09-20 | Disposition: A | Discharge status: Home / Self Care | Payer: PPO | Source: Ambulatory Visit | Attending: Family Medicine | Admitting: Family Medicine

## 2019-09-20 DIAGNOSIS — Z1231 Encounter for screening mammogram for malignant neoplasm of breast: Secondary | ICD-10-CM

## 2019-09-22 ENCOUNTER — Other Ambulatory Visit: Payer: Self-pay | Admitting: Family Medicine

## 2019-09-22 DIAGNOSIS — M1990 Unspecified osteoarthritis, unspecified site: Secondary | ICD-10-CM | POA: Diagnosis not present

## 2019-09-22 DIAGNOSIS — M069 Rheumatoid arthritis, unspecified: Secondary | ICD-10-CM | POA: Diagnosis not present

## 2019-09-22 DIAGNOSIS — I447 Left bundle-branch block, unspecified: Secondary | ICD-10-CM | POA: Diagnosis not present

## 2019-09-22 DIAGNOSIS — K219 Gastro-esophageal reflux disease without esophagitis: Secondary | ICD-10-CM | POA: Diagnosis not present

## 2019-09-22 DIAGNOSIS — J449 Chronic obstructive pulmonary disease, unspecified: Secondary | ICD-10-CM | POA: Diagnosis not present

## 2019-09-22 DIAGNOSIS — Z4800 Encounter for change or removal of nonsurgical wound dressing: Secondary | ICD-10-CM | POA: Diagnosis not present

## 2019-09-22 DIAGNOSIS — M5136 Other intervertebral disc degeneration, lumbar region: Secondary | ICD-10-CM | POA: Diagnosis not present

## 2019-09-22 DIAGNOSIS — M5417 Radiculopathy, lumbosacral region: Secondary | ICD-10-CM | POA: Diagnosis not present

## 2019-09-22 DIAGNOSIS — I6523 Occlusion and stenosis of bilateral carotid arteries: Secondary | ICD-10-CM | POA: Diagnosis not present

## 2019-09-22 DIAGNOSIS — G834 Cauda equina syndrome: Secondary | ICD-10-CM | POA: Diagnosis not present

## 2019-09-22 DIAGNOSIS — K5900 Constipation, unspecified: Secondary | ICD-10-CM | POA: Diagnosis not present

## 2019-09-22 DIAGNOSIS — G4700 Insomnia, unspecified: Secondary | ICD-10-CM | POA: Diagnosis not present

## 2019-09-22 DIAGNOSIS — I7300 Raynaud's syndrome without gangrene: Secondary | ICD-10-CM | POA: Diagnosis not present

## 2019-09-22 DIAGNOSIS — R928 Other abnormal and inconclusive findings on diagnostic imaging of breast: Secondary | ICD-10-CM

## 2019-09-22 DIAGNOSIS — R339 Retention of urine, unspecified: Secondary | ICD-10-CM | POA: Diagnosis not present

## 2019-09-22 DIAGNOSIS — L89152 Pressure ulcer of sacral region, stage 2: Secondary | ICD-10-CM | POA: Diagnosis not present

## 2019-09-22 DIAGNOSIS — I10 Essential (primary) hypertension: Secondary | ICD-10-CM | POA: Diagnosis not present

## 2019-09-22 DIAGNOSIS — R32 Unspecified urinary incontinence: Secondary | ICD-10-CM | POA: Diagnosis not present

## 2019-09-22 DIAGNOSIS — E538 Deficiency of other specified B group vitamins: Secondary | ICD-10-CM | POA: Diagnosis not present

## 2019-09-22 DIAGNOSIS — F17200 Nicotine dependence, unspecified, uncomplicated: Secondary | ICD-10-CM | POA: Diagnosis not present

## 2019-09-22 DIAGNOSIS — F331 Major depressive disorder, recurrent, moderate: Secondary | ICD-10-CM | POA: Diagnosis not present

## 2019-09-22 DIAGNOSIS — E43 Unspecified severe protein-calorie malnutrition: Secondary | ICD-10-CM | POA: Diagnosis not present

## 2019-09-22 DIAGNOSIS — I872 Venous insufficiency (chronic) (peripheral): Secondary | ICD-10-CM | POA: Diagnosis not present

## 2019-09-22 DIAGNOSIS — E785 Hyperlipidemia, unspecified: Secondary | ICD-10-CM | POA: Diagnosis not present

## 2019-09-22 DIAGNOSIS — M810 Age-related osteoporosis without current pathological fracture: Secondary | ICD-10-CM | POA: Diagnosis not present

## 2019-09-22 DIAGNOSIS — F411 Generalized anxiety disorder: Secondary | ICD-10-CM | POA: Diagnosis not present

## 2019-10-03 ENCOUNTER — Ambulatory Visit
Admission: RE | Admit: 2019-10-03 | Discharge: 2019-10-03 | Disposition: A | Discharge status: Home / Self Care | Payer: PPO | Source: Ambulatory Visit | Attending: Family Medicine | Admitting: Family Medicine

## 2019-10-03 ENCOUNTER — Other Ambulatory Visit: Payer: Self-pay

## 2019-10-03 DIAGNOSIS — R922 Inconclusive mammogram: Secondary | ICD-10-CM | POA: Diagnosis not present

## 2019-10-03 DIAGNOSIS — R928 Other abnormal and inconclusive findings on diagnostic imaging of breast: Secondary | ICD-10-CM

## 2019-10-03 DIAGNOSIS — N6489 Other specified disorders of breast: Secondary | ICD-10-CM | POA: Diagnosis not present

## 2019-10-12 DIAGNOSIS — M5416 Radiculopathy, lumbar region: Secondary | ICD-10-CM | POA: Diagnosis not present

## 2019-10-12 DIAGNOSIS — F419 Anxiety disorder, unspecified: Secondary | ICD-10-CM | POA: Diagnosis not present

## 2019-10-12 DIAGNOSIS — I10 Essential (primary) hypertension: Secondary | ICD-10-CM | POA: Diagnosis not present

## 2019-10-19 DIAGNOSIS — Z1159 Encounter for screening for other viral diseases: Secondary | ICD-10-CM | POA: Diagnosis not present

## 2019-10-23 ENCOUNTER — Other Ambulatory Visit: Payer: Self-pay | Admitting: Adult Health

## 2019-10-23 DIAGNOSIS — I10 Essential (primary) hypertension: Secondary | ICD-10-CM

## 2019-10-24 DIAGNOSIS — K449 Diaphragmatic hernia without obstruction or gangrene: Secondary | ICD-10-CM | POA: Diagnosis not present

## 2019-10-24 DIAGNOSIS — K222 Esophageal obstruction: Secondary | ICD-10-CM | POA: Diagnosis not present

## 2019-10-24 DIAGNOSIS — K3189 Other diseases of stomach and duodenum: Secondary | ICD-10-CM | POA: Diagnosis not present

## 2019-10-24 DIAGNOSIS — K2980 Duodenitis without bleeding: Secondary | ICD-10-CM | POA: Diagnosis not present

## 2019-10-24 DIAGNOSIS — K317 Polyp of stomach and duodenum: Secondary | ICD-10-CM | POA: Diagnosis not present

## 2019-10-25 DIAGNOSIS — H25013 Cortical age-related cataract, bilateral: Secondary | ICD-10-CM | POA: Diagnosis not present

## 2019-10-25 DIAGNOSIS — H5203 Hypermetropia, bilateral: Secondary | ICD-10-CM | POA: Diagnosis not present

## 2019-10-25 DIAGNOSIS — H52203 Unspecified astigmatism, bilateral: Secondary | ICD-10-CM | POA: Diagnosis not present

## 2019-10-25 DIAGNOSIS — H04123 Dry eye syndrome of bilateral lacrimal glands: Secondary | ICD-10-CM | POA: Diagnosis not present

## 2019-10-26 DIAGNOSIS — K2980 Duodenitis without bleeding: Secondary | ICD-10-CM | POA: Diagnosis not present

## 2019-11-01 ENCOUNTER — Encounter: Payer: Self-pay | Admitting: Internal Medicine

## 2019-11-01 DIAGNOSIS — M8580 Other specified disorders of bone density and structure, unspecified site: Secondary | ICD-10-CM | POA: Insufficient documentation

## 2019-11-03 DIAGNOSIS — R338 Other retention of urine: Secondary | ICD-10-CM | POA: Diagnosis not present

## 2019-11-07 ENCOUNTER — Other Ambulatory Visit: Payer: Self-pay

## 2019-11-07 ENCOUNTER — Ambulatory Visit (INDEPENDENT_AMBULATORY_CARE_PROVIDER_SITE_OTHER): Payer: PPO | Admitting: Internal Medicine

## 2019-11-07 ENCOUNTER — Encounter: Payer: Self-pay | Admitting: Internal Medicine

## 2019-11-07 VITALS — BP 130/78 | HR 98 | Temp 97.7°F | Ht 64.0 in | Wt 91.0 lb

## 2019-11-07 DIAGNOSIS — E2740 Unspecified adrenocortical insufficiency: Secondary | ICD-10-CM | POA: Diagnosis not present

## 2019-11-07 DIAGNOSIS — K5901 Slow transit constipation: Secondary | ICD-10-CM

## 2019-11-07 DIAGNOSIS — M810 Age-related osteoporosis without current pathological fracture: Secondary | ICD-10-CM | POA: Diagnosis not present

## 2019-11-07 DIAGNOSIS — N952 Postmenopausal atrophic vaginitis: Secondary | ICD-10-CM | POA: Diagnosis not present

## 2019-11-07 DIAGNOSIS — I10 Essential (primary) hypertension: Secondary | ICD-10-CM

## 2019-11-07 DIAGNOSIS — F321 Major depressive disorder, single episode, moderate: Secondary | ICD-10-CM | POA: Diagnosis not present

## 2019-11-07 DIAGNOSIS — M069 Rheumatoid arthritis, unspecified: Secondary | ICD-10-CM | POA: Diagnosis not present

## 2019-11-07 DIAGNOSIS — D071 Carcinoma in situ of vulva: Secondary | ICD-10-CM | POA: Diagnosis not present

## 2019-11-07 DIAGNOSIS — S72001S Fracture of unspecified part of neck of right femur, sequela: Secondary | ICD-10-CM

## 2019-11-07 DIAGNOSIS — I872 Venous insufficiency (chronic) (peripheral): Secondary | ICD-10-CM | POA: Diagnosis not present

## 2019-11-07 MED ORDER — SERTRALINE HCL 25 MG PO TABS: 25.0000 mg | ORAL_TABLET | Freq: Every day | ORAL | 1 refills | Status: DC

## 2019-11-07 NOTE — Patient Instructions (Addendum)
[email or web address]  Please use barrier cream with zinc on the vaginal area to prevent irritation  We'll wean you off your buspar after 4 weeks on zoloft 25mg .  We can meet again then to see how you're doing.  You may also gradually need less xanax.

## 2019-11-07 NOTE — Progress Notes (Signed)
Provider:  Dr. Hollace Kinnier Location:   [place] of Service:   [facility]  PCP: Gayland Curry, DO Patient Care Team: Gayland Curry, DO as PCP - General (Geriatric Medicine) Jerline Pain, MD as PCP - Cardiology (Cardiology) McKenzie, Candee Furbish, MD as Consulting Physician (Urology) Kristeen Miss, MD as Consulting Physician (Neurosurgery)  Extended Emergency Contact Information Primary Emergency Contact: Arlee Muslim Address: [address], [place] [address] Johnnette Litter of [place] Phone: [phone] Mobile Phone: [phone] Relation: Son  Goals of Care: Advanced Directive information Advanced Directives 08/19/2019  Does Patient Have a Medical Advance Directive? No  Type of Advance Directive -  Does patient want to make changes to medical advance directive? -  Copy of [place] in Chart? -  Would patient like information on creating a medical advance directive? No - Patient declined  Pre-existing out of facility DNR order (yellow form or pink MOST form) -      No chief complaint on file.   HPI: Patient is a 74 y.o. female seen today to establish care as a new patient.   Referred by Dr. Anthoney Harada due to polypharmacy. Dr. Justin Mend at [facility] on [address] was her former PCP.   Caregiver present for visit.   She has two children, one is deceased.   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   Lives in a two-story condo. She does not go upstairs. Denies rugs or cords in walkways. Bathroom has raised toilet seat and handle bars. Ambulates with a cane.   She has not had her covid vaccine yet. Asking for information.   Hypertension- She was diagnosed in her 3's. Followed by Cardiology/ Dr. Marlou Porch, she was seen by him in 2020. Takes dilitiazem 10mg  oral suspension.   Anxiety- 08/01/17 her husband committed suicide at age 20. She witnessed the incident. Claims her husband had depression spells.  Since the incident, she sees a Agricultural consultant with [facility] every other week via telephone. Situations that are new increase her anxiety. Being by herself also makes her anxious. She is having panic attacks a couple times a week. She is taking 1/2 xanax two-three daily. She does not think it helps. She is also taking Buspar. Caregiver claims the Buspar is making her uncoordinated and confused within the first hour of taking, these symptoms subside.   Insomnia- takes trazadone. Sleeps about 6 hours a night.   Rheumatoid arthritis- She was diagnosed around the age 75-50's. Followed by Dr. Trudie Reed. Taking prednisone 5 mg daily. She has trouble with her hands, but still manages. Her thumbs and pointer finger are most troublesome.   COPD- She smokes two cigarettes daily. States when this pack is done she will quit. She started smoking in her 47's. She claims she had a CT of her chest to r/o lung cancer within the past 10 years. Denies trouble breathing or chronic cough.   Osteoporosis- Last bone density in December 2020. Gets Prolia injections every 6 months. Right femur fracture with Dr. Percell Miller 03/2019 due to a fall. Completed PT and has recovered. No recent falls. Ambulates with a front wheeled walker.   Chronic back pain- Followed by Dr. Denman George. She has a herniated disc and was operated on 01/28/2019. Back pain has subsided, but will occasionally radiate down left leg. Will take tylenol 1000 mg BID PRN with severe pain.   Urinary incontinence-  Followed by Dr. Alyson Ingles. States she has urinary frequency, but no more incontinence. Urinates 1-2 times a night. Urinary issues started after back surgery in 2020. She had a foley placed for about 7-8 weeks after procedure. Foley was d/c when she fractured her hip. She stopped having urinary issued after foley was removed.   Colitis- She has had a history of c-diff within the past 5 years. No recent flare ups. Will use a OTC stool softner, miralax or imodium for  bowel issues.   Yeast infection- Upper left labia is itchy. Denies discharge. Has tried Monistat, but has not helped. She has also tried zinc oxzide.   Last dental appointment 3 years ago.Has upper dentures  Last eye exam was last week with Dr. Delman Cheadle.   Appetite is fair. She eats about two meals a day with lots of snacks. Hydrates well. Drinks cranberry juice, water and coffee, and some ginger ale.      Past Medical History:  Diagnosis Date  . AIN grade I   . Anemia   . Anxiety   . Bruises easily   . Chronic diarrhea   . Chronic hyponatremia   . Colitis   . COPD (chronic obstructive pulmonary disease) ([place])   . Depression   . GERD (gastroesophageal reflux disease)   . History of chronic bronchitis   . History of vulvar dysplasia    serveral recurrency's   . Hypertension   . Insomnia   . LBBB (left bundle branch block) W/ PROLONGED PR   CARDIOLOGSIT-  DR Johnsie Cancel-   LOV IN EPIC  . Macrocytosis   . Major depressive disorder   . Nocturia   . Osteoporosis   . Raynaud's disease   . Sciatica    right side  . Seronegative rheumatoid arthritis ([place])   . Short of breath on exertion   . Smokers' cough ([place])   . Venous insufficiency of leg    , Edema  . VIN III (vulvar intraepithelial neoplasia III)    RECURRENT -  . Vitamin B12 deficiency    Past Surgical History:  Procedure Laterality Date  . APPENDECTOMY  1992  . BIOPSY  08/19/2019   Procedure: BIOPSY;  Surgeon: [name], MD;  Location: [facility];  Service: Gastroenterology;;  . CARDIOVASCULAR STRESS TEST  06-23-2012  DR Jenkins Rouge   NORMAL LEXISCAN MYOVIEW/ EF 83%/  NO ISCHEMIA  . CO2 LASER APPLICATION  [id number]   Procedure: CO2 LASER APPLICATION;  Surgeon: Janie Morning, MD PHD;  Location: [facility];  Service: Gynecology;  Laterality: N/A;  Laser Vaporization  . CO2 LASER APPLICATION N/A [id number]   Procedure: CO2 LASER APPLICATION;  Surgeon: [name], MD;  Location: [facility];  Service: Gynecology;  Laterality: N/A;  . CO2 LASER APPLICATION N/A [id number]   Procedure: CO2 LASER APPLICATION of the vulva;  Surgeon: Janie Morning, MD;  Location[facility];  Service: Gynecology;  Laterality: N/A;  . COLONOSCOPY WITH PROPOFOL N/A 08/19/2019   Procedure: COLONOSCOPY WITH PROPOFOL;  Surgeon: [name], MD;  Location: [facility];  Service: Gastroenterology;  Laterality: N/A;  . ESOPHAGOGASTRODUODENOSCOPY (EGD) WITH PROPOFOL N/A 08/19/2019   Procedure: ESOPHAGOGASTRODUODENOSCOPY (EGD) WITH PROPOFOL;  Surgeon: [name], MD;  Location: [facility];  Service: Gastroenterology;  Laterality: N/A;  . EXCISION VULVAR LESIONS  07/2012  . HEMORRHOID SURGERY N/A 04/15/2013   Procedure: Excision of external and internal  hemorrhoid with AIN, Exam under anesthesia;  Surgeon: Odis Hollingshead, MD;  Location:  [facility];  Service: General;  Laterality: N/A;  . HIP PINNING,CANNULATED Right 03/15/2019   Procedure: Maebelle Munroe OF FEMORAL NECK FRACTURE;  Surgeon: Renette Butters, MD;  Location: [facility];  Service: Orthopedics;  Laterality: Right;  . POLYPECTOMY  08/19/2019   Procedure: POLYPECTOMY;  Surgeon: [name], MD;  Location: [facility];  Service: Gastroenterology;;  . REPAIR FISTULA IN ANO/  I & D PERIRECTAL ABSCESS  10-26-2002  DR Johnathan Hausen  . TRANSTHORACIC ECHOCARDIOGRAM  06-23-2012   NORMAL LVSF/ EF 55-60%  . VAGINAL HYSTERECTOMY  1997   partial  . VULVAR LESION REMOVAL N/A 11/03/2014   Procedure: CO2 LASER OF VULVAR ;  Surgeon: [name], MD;  Location: [facility];  Service: Gynecology;  Laterality: N/A;  . VULVECTOMY N/A 03/26/2015   Procedure: WIDE LOCA  EXCISION OF VULVAR;  Surgeon: [name], MD;  Location: [facility];  Service: Gynecology;  Laterality: N/A;  . Wide Local Excision of labia majora  04-04-2010   Right-sided lesion, CO2 ablation of right labia minora    reports that she has quit smoking. Her  smoking use included cigarettes. She has a 12.50 pack-year smoking history. She has never used smokeless tobacco. She reports previous alcohol use of about 2.0 standard drinks of alcohol per week. She reports that she does not use drugs. Social History   Socioeconomic History  . Marital status: Widowed    Spouse name: Not on file  . Number of children: Not on file  . Years of education: Not on file  . Highest education level: Not on file  Occupational History  . Not on file  Tobacco Use  . Smoking status: Former Smoker    Packs/day: 0.25    Years: 50.00    Pack years: 12.50    Types: Cigarettes  . Smokeless tobacco: Never Used  Substance and Sexual Activity  . Alcohol use: Not Currently    Alcohol/week: 2.0 standard drinks    Types: 2 Cans of beer per week  . Drug use: No  . Sexual activity: Yes  Other Topics Concern  . Not on file  Social History Narrative   Social History      Diet? reg      Do you drink/eat things with caffeine? yes      Marital status?                  Widowed                   What year were you married? 1993      Do you live in a house, apartment, assisted living, condo, trailer, etc.? House condo      Is it one or more stories? 2 stories       How many persons live in your home? 1      Do you have any pets in your home? (please list) 0      Highest level of education completed?1 year college       Current or past profession: telemarketer      Do you exercise?                           yes           Type & how often?  Walk 2 - 3  Outside/inside  everyday       Advanced Directives      Do you have  a living will? yes      Do you have a DNR form?      yes                            If not, do you want to discuss one?       Do you have signed POA/HPOA for forms? No       Functional Status      Do you have difficulty bathing or dressing yourself? yes      Do you have difficulty preparing food or eating? yes      Do you have difficulty  managing your medications? yes      Do you have difficulty managing your finances? yes      Do you have difficulty affording your medications? No       Social Determinants of Health   Financial Resource Strain:   . Difficulty of Paying Living Expenses: Not on file  Food Insecurity:   . Worried About Charity fundraiser in the Last Year: Not on file  . Ran Out of Food in the Last Year: Not on file  Transportation Needs:   . Lack of Transportation (Medical): Not on file  . Lack of Transportation (Non-Medical): Not on file  Physical Activity:   . Days of Exercise per Week: Not on file  . Minutes of Exercise per Session: Not on file  Stress:   . Feeling of Stress : Not on file  Social Connections:   . Frequency of Communication with Friends and Family: Not on file  . Frequency of Social Gatherings with Friends and Family: Not on file  . Attends Religious Services: Not on file  . Active Member of Clubs or Organizations: Not on file  . Attends Archivist Meetings: Not on file  . Marital Status: Not on file  Intimate Partner Violence:   . Fear of Current or Ex-Partner: Not on file  . Emotionally Abused: Not on file  . Physically Abused: Not on file  . Sexually Abused: Not on file    Functional Status Survey:    Family History  Problem Relation Age of Onset  . Diabetes Mother   . Hypertension Father   . Heart attack Father 65  . Lymphoma Son   . Cancer Son        lymphoblastic lymphoma    Health Maintenance  Topic Date Due  . Hepatitis C Screening  02-09-1946  . PNA vac Low Risk Adult (1 of 2 - PCV13) 02/22/2010  . MAMMOGRAM  09/19/2021  . TETANUS/TDAP  09/11/2028  . COLONOSCOPY  08/18/2029  . INFLUENZA VACCINE  Completed  . DEXA SCAN  Completed    Allergies  Allergen Reactions  . Latex Swelling and Other (See Comments)     fever blisters  . Augmentin [Amoxicillin-Pot Clavulanate] Nausea And Vomiting  . Doxycycline Other (See Comments)    Blurred  vision, nervous, unsteady  . Hydrocodone Itching    Acetaminophen:itching, confusion, hallucinations-side effects-Onset date 07/15/2018  . Lactose Intolerance (Gi) Other (See Comments)    Unknown, per pt  . Lialda [Mesalamine] Diarrhea  . Prinivil [Lisinopril] Diarrhea  . Triamcinolone Acetonide Other (See Comments)    REDNESS AND PAIN  . Amlodipine Rash  . Lotensin [Benazepril Hcl] Rash    Outpatient Encounter Medications as of 11/07/2019  Medication Sig  . acetaminophen (TYLENOL) 325 MG tablet Take 325 mg by mouth every 6 (six)  hours as needed. 2 tablets  . alendronate (FOSAMAX) 70 MG tablet Take 70 mg by mouth. 1 tablet 30 minutes before the first food, beverage or medicine of the day with plain water.  . busPIRone (BUSPAR) 30 MG tablet Take by mouth 2 (two) times daily.  . calcium carbonate (OSCAL) 1500 (600 Ca) MG TABS tablet Take 600 mg of elemental calcium by mouth daily.  . carvedilol (COREG) 6.25 MG tablet Take 6.25 mg by mouth 2 (two) times daily with a meal.  . cholestyramine (QUESTRAN) 4 GM/DOSE powder Take 4 g by mouth 3 (three) times daily with meals.  Marland Kitchen diltiazem (CARDIZEM) 10 mg/ml oral suspension Take 360 mg by mouth daily at 6 (six) AM.  . ferrous sulfate 325 (65 FE) MG tablet Take 325 mg by mouth daily with breakfast.  . hydrALAZINE (APRESOLINE) 50 MG tablet Take 50 mg by mouth in the morning, at noon, in the evening, and at bedtime.  . hydrocortisone (CORTEF) 5 MG tablet Take 5 mg by mouth daily. Take with food or milk  . hydroxychloroquine (PLAQUENIL) 200 MG tablet Take 200 mg by mouth daily.  . pantoprazole (PROTONIX) 40 MG tablet Take 40 mg by mouth daily.  . polyethylene glycol (MIRALAX / GLYCOLAX) 17 g packet Take 17 g by mouth daily.  . Potassium 75 MG TABS Take by mouth.  . predniSONE (DELTASONE) 5 MG tablet Take 5 mg by mouth daily.  . Prenatal Vit-Fe Fumarate-FA (MULTIVITAMIN-PRENATAL) 27-0.8 MG TABS tablet Take 1 tablet by mouth daily at 12 noon.   No  facility-administered encounter medications on file as of 11/07/2019.    Review of Systems  Constitutional: Negative for activity change, appetite change and fatigue.  HENT: Negative for dental problem, hearing loss and trouble swallowing.        Upper dentures  Eyes: Negative for photophobia and visual disturbance.  Respiratory: Negative for cough and shortness of breath.   Cardiovascular: Negative for chest pain and leg swelling.  Gastrointestinal: Negative for abdominal pain, constipation and diarrhea.  Endocrine: Negative for polydipsia, polyphagia and polyuria.  Genitourinary: Positive for frequency. Negative for dysuria, hematuria and vaginal bleeding.       Vaginal itching  Musculoskeletal: Positive for arthralgias and back pain.  Skin:       Thin skin due to prednisone  Neurological: Positive for weakness. Negative for dizziness and headaches.  Hematological: Bruises/bleeds easily.  Psychiatric/Behavioral: Positive for dysphoric mood and sleep disturbance. The patient is nervous/anxious.     There were no vitals filed for this visit. There is no height or weight on file to calculate BMI. Physical Exam Vitals reviewed.  Constitutional:      Appearance: Normal appearance. She is normal weight.  HENT:     Head: Normocephalic.  Neck:     Thyroid: No thyroid mass, thyromegaly or thyroid tenderness.  Cardiovascular:     Rate and Rhythm: Normal rate and regular rhythm.     Heart sounds: Murmur present.  Pulmonary:     Effort: Pulmonary effort is normal. No respiratory distress.     Breath sounds: Normal breath sounds. No wheezing.  Abdominal:     General: Abdomen is flat. Bowel sounds are normal.     Palpations: Abdomen is soft.  Musculoskeletal:     Right lower leg: No edema.     Left lower leg: No edema.     Comments: Extremities cool to touch  Lymphadenopathy:     Cervical: No cervical adenopathy.  Skin:  General: Skin is warm and dry.     Capillary Refill:  Capillary refill takes less than 2 seconds.     Findings: Bruising present.     Comments: Skin thin. Extremities with scattered purple bruising.   Neurological:     General: No focal deficit present.     Mental Status: She is alert and oriented to person, place, and time. Mental status is at baseline.     Gait: Gait abnormal.  Psychiatric:        Mood and Affect: Mood normal.        Behavior: Behavior normal.        Thought Content: Thought content normal.        Judgment: Judgment normal.     Labs reviewed: Basic Metabolic Panel: Recent Labs    02/21/19 0559 02/21/19 0559 02/22/19 0630 03/12/19 0403 03/13/19 0246 03/13/19 1050 08/19/19 0552 08/20/19 0503 08/21/19 0719  NA 125*   < > 126*   < > 118*   < > 128* 127* 126*  K 4.4   < > 3.8   < > 3.9   < > 3.7 3.4* 3.3*  CL 99   < > 97*   < > 88*   < > 99 97* 97*  CO2 21*   < > 21*   < > 20*   < > 20* 20* 20*  GLUCOSE 111*   < > 86   < > 87   < > 80 120* 113*  BUN 24*   < > 24*   < > 14   < > 9 10 7*  CREATININE 1.15*   < > 1.06*   < > 1.03*   < > 0.92 0.92 0.78  CALCIUM 8.3*   < > 8.5*   < > 8.1*   < > 8.3* 8.6* 8.5*  MG 1.8  --  1.8  --  2.2  --   --   --   --    < > = values in this interval not displayed.   Liver Function Tests: Recent Labs    08/17/19 1504  AST 23  ALT 26  ALKPHOS 41  BILITOT 0.5  PROT 6.5  ALBUMIN 3.6   No results for input(s): LIPASE, AMYLASE in the last 8760 hours. No results for input(s): AMMONIA in the last 8760 hours. CBC: Recent Labs    03/12/19 0403 03/13/19 0246 03/29/19 0000 04/26/19 0000 08/17/19 1504 08/17/19 1855 08/19/19 0552 08/20/19 0503 08/21/19 0719  WBC 11.9*   < > 5.8   < > 9.4   < > 5.8 6.8 6.9  NEUTROABS 9.9*  --  3  --  8.4*  --   --   --   --   HGB 10.8*   < > 9.9*   < > 9.9*   < > 9.2* 9.8* 9.7*  HCT 29.8*   < > 28*   < > 28.7*   < > 26.5* 26.8* 26.1*  MCV 100.7*   < >  --   --  107.9*   < > 106.4* 101.1* 99.6  PLT 225   < > 251   < > 236   < > 238 236 232    < > = values in this interval not displayed.   Cardiac Enzymes: No results for input(s): CKTOTAL, CKMB, CKMBINDEX, TROPONINI in the last 8760 hours. BNP: Invalid input(s): POCBNP Lab Results  Component Value Date   HGBA1C 5.4 09/24/2017   Lab Results  Component Value Date   TSH 0.944 02/19/2019   Lab Results  Component Value Date   VITAMINB12 1,298 (H) 02/15/2019   Lab Results  Component Value Date   FOLATE 34.6 02/15/2019   Lab Results  Component Value Date   IRON 44 02/15/2019   TIBC 272 02/15/2019   FERRITIN 172 02/15/2019    Imaging and Procedures obtained prior to SNF admission: US BREAST LTD UNI LEFT INC AXILLA  Result Date: 10/03/2019 CLINICAL DATA:  Possible skin thickening in the anterior left breast on a recent screening mammogram. The patient reports that she is constantly lying on her left side, whether sleeping or spending the day on her couch. She reports that she does this because it is more comfortable for her and has been doing it since February or May 2020. She also reports having gotten a decubitus ulcer in the past as a result of constantly lying on the left side. She has also had an approximately 20 lb weight loss in the past year. EXAM: DIGITAL DIAGNOSTIC LEFT MAMMOGRAM WITH TOMO ULTRASOUND LEFT BREAST COMPARISON:  Previous exam(s). ACR Breast Density Category c: The breast tissue is heterogeneously dense, which may obscure small masses. FINDINGS: 3D tomographic and 2D generated spot compression views of the left breast confirm diffuse skin thickening involving the areola and superior periareolar breast. No visible mass or other findings suspicious for malignancy. On physical exam, there is no visible skin thickening or peau de orange changes on the left. No skin discoloration is seen. There is no palpable mass in the left breast or left axilla. Targeted ultrasound is performed, showing mild anterior skin thickening in the left breast. Also demonstrated is  normal dense glandular tissue throughout the breast with no mass, shadowing or other findings suspicious for malignancy. Ultrasound of the left axilla demonstrated normal appearing left axillary lymph nodes. IMPRESSION: Anterior left breast skin thickening with no underlying mammographic or sonographic evidence of malignancy and no adenopathy. This is compatible with dependent edema associated with the amount of time the patient has been lying on her left side throughout the night and day. RECOMMENDATION: 1. Bilateral screening mammogram in 1 year. 2. Clinical follow-up. I have discussed the findings and recommendations with the patient. If applicable, a reminder letter will be sent to the patient regarding the next appointment. BI-RADS CATEGORY  2: Benign. Electronically Signed   By: Claudie Revering M.D.   On: 10/03/2019 15:57   MM DIAG BREAST TOMO UNI LEFT  Result Date: 10/03/2019 CLINICAL DATA:  Possible skin thickening in the anterior left breast on a recent screening mammogram. The patient reports that she is constantly lying on her left side, whether sleeping or spending the day on her couch. She reports that she does this because it is more comfortable for her and has been doing it since February or May 2020. She also reports having gotten a decubitus ulcer in the past as a result of constantly lying on the left side. She has also had an approximately 20 lb weight loss in the past year. EXAM: DIGITAL DIAGNOSTIC LEFT MAMMOGRAM WITH TOMO ULTRASOUND LEFT BREAST COMPARISON:  Previous exam(s). ACR Breast Density Category c: The breast tissue is heterogeneously dense, which may obscure small masses. FINDINGS: 3D tomographic and 2D generated spot compression views of the left breast confirm diffuse skin thickening involving the areola and superior periareolar breast. No visible mass or other findings suspicious for malignancy. On physical exam, there is no visible skin thickening or peau de orange  changes on the  left. No skin discoloration is seen. There is no palpable mass in the left breast or left axilla. Targeted ultrasound is performed, showing mild anterior skin thickening in the left breast. Also demonstrated is normal dense glandular tissue throughout the breast with no mass, shadowing or other findings suspicious for malignancy. Ultrasound of the left axilla demonstrated normal appearing left axillary lymph nodes. IMPRESSION: Anterior left breast skin thickening with no underlying mammographic or sonographic evidence of malignancy and no adenopathy. This is compatible with dependent edema associated with the amount of time the patient has been lying on her left side throughout the night and day. RECOMMENDATION: 1. Bilateral screening mammogram in 1 year. 2. Clinical follow-up. I have discussed the findings and recommendations with the patient. If applicable, a reminder letter will be sent to the patient regarding the next appointment. BI-RADS CATEGORY  2: Benign. Electronically Signed   By: Claudie Revering M.D.   On: 10/03/2019 15:57    Assessment/Plan 1. Depression, major, single episode, moderate ([place]) - she struggles with anxiety and depression since her husbands suicide - she denies any recent episodes of depression - recommend starting SSRI for anxiety and depression - recommend weaning off buspar after 4 weeks and starting zoloft 25 mg daily  2. Atrophic vaginits - suspect vaginal dryness over yeast - recommend applying barrier cream with zinc to prevent irritation  3. Adrenal insufficiency ([place]) - has prolonged use of prednisone and hydrocortisone  - followed by rheumatologist for RA  4. Essential hypertension - bp at goal <150/90 - followed by cardiology - continue to follow low sodium diet - continue dilitiazem regimen  5. Closed fracture of neck of right femur, sequela - followed by Dr. Percell Miller - some radiating pain from injured site - continue tylenol PRN for pain  6. Senile  osteoporosis - she is a high risk for fall with fracture - last bone density 08/2019 - continue fosamax regimen - continue to ambulate with front rolling walker  7. Slow transit constipation - stable with stool sofetner and miralax - continue to eat a diet rich in fruits and vegetables  8. Rheumatoid arthritis, involving unspecific site, unspecified whether rheumatoid factor present ([place]) - followed by rheumatology - continue prednisone and tylenol regimen  9. Venous insufficiency of both lower extremities - extremities cool to touch - continue to wear compression stockings  10. Vulvar intraepithelial neoplasia III (VIN III) - upper vulvar dry and itchy - do not recommend estrogen cream due to history - may apply barrier cream to affected area  There are no diagnoses linked to this encounter.   Family/ staff Communication:   Labs/tests ordered:none

## 2019-12-05 ENCOUNTER — Ambulatory Visit: Payer: PPO | Admitting: Internal Medicine

## 2019-12-12 ENCOUNTER — Other Ambulatory Visit: Payer: Self-pay

## 2019-12-12 ENCOUNTER — Encounter: Payer: Self-pay | Admitting: Internal Medicine

## 2019-12-12 ENCOUNTER — Ambulatory Visit (INDEPENDENT_AMBULATORY_CARE_PROVIDER_SITE_OTHER): Payer: PPO | Admitting: Internal Medicine

## 2019-12-12 VITALS — BP 110/60 | HR 96 | Temp 97.8°F | Resp 16 | Ht 64.0 in | Wt 90.8 lb

## 2019-12-12 DIAGNOSIS — F411 Generalized anxiety disorder: Secondary | ICD-10-CM | POA: Diagnosis not present

## 2019-12-12 DIAGNOSIS — F321 Major depressive disorder, single episode, moderate: Secondary | ICD-10-CM | POA: Diagnosis not present

## 2019-12-12 DIAGNOSIS — R636 Underweight: Secondary | ICD-10-CM

## 2019-12-12 DIAGNOSIS — Z716 Tobacco abuse counseling: Secondary | ICD-10-CM | POA: Diagnosis not present

## 2019-12-12 DIAGNOSIS — Z681 Body mass index (BMI) 19 or less, adult: Secondary | ICD-10-CM

## 2019-12-12 DIAGNOSIS — L602 Onychogryphosis: Secondary | ICD-10-CM

## 2019-12-12 DIAGNOSIS — F17200 Nicotine dependence, unspecified, uncomplicated: Secondary | ICD-10-CM | POA: Diagnosis not present

## 2019-12-12 MED ORDER — ALPRAZOLAM 0.25 MG PO TABS: 0.2500 mg | ORAL_TABLET | Freq: Two times a day (BID) | ORAL | 0 refills | Status: DC | PRN

## 2019-12-12 MED ORDER — SERTRALINE HCL 50 MG PO TABS: 50.0000 mg | ORAL_TABLET | Freq: Every day | ORAL | 0 refills | Status: DC

## 2019-12-12 NOTE — Patient Instructions (Addendum)
Try to get down to one cigarette per day by our next visit.      I want you to be more physically active so you get stronger.    Please bring me a copy of your notarized living will.    Call me in 2 weeks to let me know how the zoloft 50mg  is doing.

## 2019-12-12 NOTE — Progress Notes (Signed)
Location:  [facility] clinic Provider:  Cadi Rhinehart L. Mariea Clonts, D.O., C.M.D.  Code Status:  Remains FULL CODE Goals of Care:  Advanced Directives 12/12/2019  Does Patient Have a Medical Advance Directive? Yes  Type of Advance Directive Living will;Healthcare Power of Attorney  Does patient want to make changes to medical advance directive? No - Patient declined  Copy of [place] in Chart? -  Would patient like information on creating a medical advance directive? -  Pre-existing out of facility DNR order (yellow form or pink MOST form) -  She has a living will that's notarized--they will bring a copy.  Discussion today indicates she does not want to be kept alive on machines and she says the living will says that, but she would not commit to a DNR order today.   Chief Complaint  Patient presents with  . Medical Management of Chronic Issues    4 Week Follow Up for Anxiety.   . Health Maintenance    Discuss the need for Hepatitis Screening  . Immunizations    Discuss the need for PNA Vaccine.    HPI: Patient is a 74 y.o. female seen today f/u on depression, anxiety.  She is here with Anne Ng, her caregiver.      She is on the zoloft and she cannot tell a difference herself.  She's still got anxiety and using the 1/2 xanax and may still take another half an hour later.  Most of the time, she's ok, but then she gets the anxiety and it drives her nuts, makes her feel bad.  Wants to stay in the bed or walk around the house.  Her caregiver also does not notice a difference either.  She told her that she only felt better two days out of 7.  She canceled one appt b/c she had diarrhea overnight. She had a bout right before the appt last week.    She is still smoking 2 cigarettes per day.  She is working on it.  She did have good success with the patch per her caregiver.    She had her first covid vaccine and second one is tomorrow.  We will plan to do pna shot next time.    Past  Medical History:  Diagnosis Date  . AIN grade I   . Anemia   . Anxiety   . Bruises easily   . Chronic diarrhea   . Chronic hyponatremia   . Colitis   . COPD (chronic obstructive pulmonary disease) ([place])   . Depression   . GERD (gastroesophageal reflux disease)   . History of chronic bronchitis   . History of vulvar dysplasia    serveral recurrency's   . Hypertension   . Insomnia   . LBBB (left bundle branch block) W/ PROLONGED PR   CARDIOLOGSIT-  DR Johnsie Cancel-   LOV IN EPIC  . Macrocytosis   . Major depressive disorder   . Nocturia   . Osteoporosis   . Raynaud's disease   . Sciatica    right side  . Seronegative rheumatoid arthritis ([place])   . Short of breath on exertion   . Smokers' cough ([place])   . Venous insufficiency of leg    , Edema  . VIN III (vulvar intraepithelial neoplasia III)    RECURRENT -  . Vitamin B12 deficiency     Past Surgical History:  Procedure Laterality Date  . APPENDECTOMY  1992  . BIOPSY  08/19/2019   Procedure: BIOPSY;  Surgeon:  [name], MD;  Location: [facility]COPY;  Service: Gastroenterology;;  . CARDIOVASCULAR STRESS TEST  06-23-2012  DR Jenkins [place]   NORMAL LEXISCAN MYOVIEW/ EF 83%/  NO ISCHEMIA  . CO2 LASER APPLICATION  [id number]   Procedure: CO2 LASER APPLICATION;  Surgeon: Janie Morning, MD PHD;  Location: [facility];  Service: Gynecology;  Laterality: N/A;  Laser Vaporization  . CO2 LASER APPLICATION N/A [id number]   Procedure: CO2 LASER APPLICATION;  Surgeon: [name], MD;  Location: [facility];  Service: Gynecology;  Laterality: N/A;  . CO2 LASER APPLICATION N/A [id number]   Procedure: CO2 LASER APPLICATION of the vulva;  Surgeon: Janie Morning, MD;  Location: [facility];  Service: Gynecology;  Laterality: N/A;  . COLONOSCOPY WITH PROPOFOL N/A 08/19/2019   Procedure: COLONOSCOPY WITH PROPOFOL;  Surgeon: [name], MD;  Location: [place];  Service: Gastroenterology;   Laterality: N/A;  . ESOPHAGOGASTRODUODENOSCOPY (EGD) WITH PROPOFOL N/A 08/19/2019   Procedure: ESOPHAGOGASTRODUODENOSCOPY (EGD) WITH PROPOFOL;  Surgeon: [name], MD;  Location: [place];  Service: Gastroenterology;  Laterality: N/A;  . EXCISION VULVAR LESIONS  07/2012  . HEMORRHOID SURGERY N/A 04/15/2013   Procedure: Excision of external and internal  hemorrhoid with AIN, Exam under anesthesia;  Surgeon: Odis Hollingshead, MD;  Location: [facility];  Service: General;  Laterality: N/A;  . HIP PINNING,CANNULATED Right 03/15/2019   Procedure: Maebelle Munroe OF FEMORAL NECK FRACTURE;  Surgeon: Renette Butters, MD;  Location: [place];  Service: Orthopedics;  Laterality: Right;  . POLYPECTOMY  08/19/2019   Procedure: POLYPECTOMY;  Surgeon: [name], MD;  Location: [facility] ENDOSCOPY;  Service: Gastroenterology;;  . REPAIR FISTULA IN ANO/  I & D PERIRECTAL ABSCESS  10-26-2002  DR Johnathan Hausen  . TRANSTHORACIC ECHOCARDIOGRAM  06-23-2012   NORMAL LVSF/ EF 55-60%  . VAGINAL HYSTERECTOMY  1997   partial  . VULVAR LESION REMOVAL N/A 11/03/2014   Procedure: CO2 LASER OF VULVAR ;  Surgeon: [name], MD;  Location: [facility];  Service: Gynecology;  Laterality: N/A;  . VULVECTOMY N/A 03/26/2015   Procedure: WIDE LOCA  EXCISION OF VULVAR;  Surgeon: [name], MD;  Location: [place];  Service: Gynecology;  Laterality: N/A;  . Wide Local Excision of labia majora  04-04-2010   Right-sided lesion, CO2 ablation of right labia minora    Allergies  Allergen Reactions  . Latex Swelling and Other (See Comments)     fever blisters  . Augmentin [Amoxicillin-Pot Clavulanate] Nausea And Vomiting  . Doxycycline Other (See Comments)    Blurred vision, nervous, unsteady  . Hydrocodone Itching    Acetaminophen:itching, confusion, hallucinations-side effects-Onset date 07/15/2018  . Lactose Intolerance (Gi) Other (See Comments)    Unknown, per pt  . Lialda [Mesalamine] Diarrhea  .  Prinivil [Lisinopril] Diarrhea  . Triamcinolone Acetonide Other (See Comments)    REDNESS AND PAIN  . Amlodipine Rash  . Lotensin [Benazepril Hcl] Rash    Outpatient Encounter Medications as of 12/12/2019  Medication Sig  . acetaminophen (TYLENOL) 325 MG tablet Take 325 mg by mouth every 6 (six) hours as needed. 2 tablets  . ALPRAZolam (XANAX) 0.25 MG tablet Take 0.125-0.25 mg by mouth 2 (two) times daily as needed.  . busPIRone (BUSPAR) 30 MG tablet Take by mouth 2 (two) times daily.  . calcium carbonate (OSCAL) 1500 (600 Ca) MG TABS tablet Take 600 mg of elemental calcium by mouth daily.  Marland Kitchen denosumab (PROLIA) 60 MG/ML SOSY injection Inject  60 mg into the skin every 6 (six) months.  . diltiazem (CARDIZEM) 10 mg/ml oral suspension Take 360 mg by mouth daily at 6 (six) AM.  . ferrous sulfate 325 (65 FE) MG tablet Take 325 mg by mouth daily with breakfast.  . hydroxychloroquine (PLAQUENIL) 200 MG tablet Take 200 mg by mouth daily.  . polyethylene glycol (MIRALAX / GLYCOLAX) 17 g packet Take 17 g by mouth as needed.   . Potassium 75 MG TABS Take 75 mg by mouth daily.   . predniSONE (DELTASONE) 5 MG tablet Take 5 mg by mouth daily.  . Prenatal Vit-Fe Fumarate-FA (MULTIVITAMIN-PRENATAL) 27-0.8 MG TABS tablet Take 1 tablet by mouth daily at 12 noon.  . sertraline (ZOLOFT) 25 MG tablet Take 1 tablet (25 mg total) by mouth daily.  . [DISCONTINUED] alendronate (FOSAMAX) 70 MG tablet Take 70 mg by mouth. 1 tablet 30 minutes before the first food, beverage or medicine of the day with plain water.  . [DISCONTINUED] hydrocortisone (CORTEF) 5 MG tablet Take 5 mg by mouth daily. Take with food or milk   No facility-administered encounter medications on file as of 12/12/2019.    Review of Systems:  Review of Systems  Constitutional: Positive for malaise/fatigue. Negative for chills and fever.  HENT: Negative for sore throat.        Concern about cerumen but no significant on exam  Eyes: Negative for  blurred vision.       Glasses  Respiratory: Negative for cough and shortness of breath.        No change and still not doing much to exert  Cardiovascular: Negative for chest pain, palpitations and leg swelling.  Gastrointestinal: Negative for abdominal pain.  Musculoskeletal: Negative for falls.  Skin: Negative for itching and rash.  Neurological: Negative for dizziness and loss of consciousness.  Endo/Heme/Allergies: Bruises/bleeds easily.  Psychiatric/Behavioral: Positive for depression. The patient is nervous/anxious. The patient does not have insomnia.        Still smoking    Health Maintenance  Topic Date Due  . Hepatitis C Screening  Never done  . PNA vac Low Risk Adult (1 of 2 - PCV13) Never done  . INFLUENZA VACCINE  04/01/2020  . MAMMOGRAM  09/19/2021  . TETANUS/TDAP  09/11/2028  . COLONOSCOPY  08/18/2029  . DEXA SCAN  Completed    Physical Exam: Vitals:   12/12/19 1444  BP: 110/60  Pulse: 96  Resp: 16  Temp: 97.8 F (36.6 C)  SpO2: 98%  Weight: 90 lb 12.8 oz (41.2 kg)  Height: 5\' 4"  (1.626 m)   Body mass index is 15.59 kg/m. Physical Exam Constitutional:      General: She is not in acute distress.    Appearance: She is not toxic-appearing.     Comments: Frail, petite female using one of our wheelchairs to get back to exam room due to weakness  Cardiovascular:     Rate and Rhythm: Normal rate and regular rhythm.     Pulses: Normal pulses.     Heart sounds: Normal heart sounds.  Pulmonary:     Effort: Pulmonary effort is normal.     Breath sounds: Normal breath sounds. No wheezing, rhonchi or rales.  Abdominal:     General: Bowel sounds are normal.     Palpations: Abdomen is soft.  Musculoskeletal:        General: Normal range of motion.     Right lower leg: No edema.     Left lower leg: No edema.  Skin:    General: Skin is warm and dry.     Coloration: Skin is pale.  Neurological:     General: No focal deficit present.     Mental Status: She is  alert and oriented to person, place, and time.  Psychiatric:     Comments: Somewhat flat affect (see hpi)     Labs reviewed: Basic Metabolic Panel: Recent Labs    02/19/19 0935 02/19/19 1507 02/21/19 0559 02/21/19 0559 02/22/19 0630 03/12/19 0403 03/13/19 0246 03/13/19 1050 08/19/19 0552 08/20/19 0503 08/21/19 0719  NA  --    < > 125*   < > 126*   < > 118*   < > 128* 127* 126*  K  --    < > 4.4   < > 3.8   < > 3.9   < > 3.7 3.4* 3.3*  CL  --    < > 99   < > 97*   < > 88*   < > 99 97* 97*  CO2  --    < > 21*   < > 21*   < > 20*   < > 20* 20* 20*  GLUCOSE  --    < > 111*   < > 86   < > 87   < > 80 120* 113*  BUN  --    < > 24*   < > 24*   < > 14   < > 9 10 7*  CREATININE  --    < > 1.15*   < > 1.06*   < > 1.03*   < > 0.92 0.92 0.78  CALCIUM  --    < > 8.3*   < > 8.5*   < > 8.1*   < > 8.3* 8.6* 8.5*  MG  --    < > 1.8  --  1.8  --  2.2  --   --   --   --   TSH 0.944  --   --   --   --   --   --   --   --   --   --    < > = values in this interval not displayed.   Liver Function Tests: Recent Labs    08/17/19 1504  AST 23  ALT 26  ALKPHOS 41  BILITOT 0.5  PROT 6.5  ALBUMIN 3.6   No results for input(s): LIPASE, AMYLASE in the last 8760 hours. No results for input(s): AMMONIA in the last 8760 hours. CBC: Recent Labs    03/12/19 0403 03/13/19 0246 03/29/19 0000 04/26/19 0000 08/17/19 1504 08/17/19 1855 08/19/19 0552 08/20/19 0503 08/21/19 0719  WBC 11.9*   < > 5.8   < > 9.4   < > 5.8 6.8 6.9  NEUTROABS 9.9*  --  3  --  8.4*  --   --   --   --   HGB 10.8*   < > 9.9*   < > 9.9*   < > 9.2* 9.8* 9.7*  HCT 29.8*   < > 28*   < > 28.7*   < > 26.5* 26.8* 26.1*  MCV 100.7*   < >  --   --  107.9*   < > 106.4* 101.1* 99.6  PLT 225   < > 251   < > 236   < > 238 236 232   < > = values in this interval not displayed.   Lipid Panel:  No results for input(s): CHOL, HDL, LDLCALC, TRIG, CHOLHDL, LDLDIRECT in the last 8760 hours. Lab Results  Component Value Date   HGBA1C  5.4 09/24/2017    Assessment/Plan 1. Depression, major, single episode, moderate (HCC) - continue process of trying to get her on a therapeutic dose of zoloft and then plan to wean the xanax -she is to use a full xanax at the outset b/c she always takes the second 1/2 anyway -Anne Ng will call to check in about how she is in another two weeks--we can then up her zoloft to 150mg  if needed and try to wean the xanax by going down to 1/2 pills -she will see me again at 6 wks - sertraline (ZOLOFT) 50 MG tablet; Take 1 tablet (50 mg total) by mouth daily.  Dispense: 30 tablet; Refill: 0 - ALPRAZolam (XANAX) 0.25 MG tablet; Take 1 tablet (0.25 mg total) by mouth 2 (two) times daily as needed for anxiety.  Dispense: 60 tablet; Refill: 0  2. Generalized anxiety disorder -as above, continues on buspar and xanax and trying to get on SSRI to help wean from benzo  3. Overgrown toenails - Anne Ng reports they are too thick for her to trim them for her so will refer - Ambulatory referral to Podiatry  4. Tobacco abuse counseling -down to 2 cigarettes per day -more than 3 mins spent counseling on benefits of quitting and Anne Ng had read her our poster on the door with the 10 ways she'll feel better/be better if she quits -I discussed how though she feels like it helps calm her, nicotine is a stimulant and doing the opposite -we set a goal for her to be down to 1 cigarette per ay when I see her in 6 wks (she smokes a part at a time)  5. Smoking addiction -ongoing, see #4 for plan, she did not want patches though caregiver said they helped her  6. Underweight -remains a concern due to her lung disease it appears to me and generalized frailty -encouraged po intake, supplement shakes and regular activity to gain muscle mass, strength and energy   7. Body mass index (BMI) of 19 or less in adult -ongoing concern, caregivers work with her faithfully to help with this--wt stable from last time, but only 90  lbs -I may refer her to nutrition next time for assistance with increasing protein in her diet  Next appt:  01/16/2020 6 wk f/u on depression/anxiety/smoking and prevnar which it appears she never had (then needs pneumovax next year)   Rayden Scheper L. Nealy Karapetian, D.O. [facility] [address],  [address] Cell Phone (Mon-Fri 8am-5pm):  [phone] On Call:  [phone] & follow prompts after 5pm & weekends Office Phone:  [phone] Office Fax:  [phone]

## 2019-12-28 ENCOUNTER — Telehealth: Payer: Self-pay

## 2019-12-28 NOTE — Telephone Encounter (Signed)
Let's increase the zoloft to 100mg  to see how that goes.  She may take two 50mg  tablets until they are used up, but may need a new Rx sent to the pharmacy.  Please call Anne Ng and let her know.

## 2019-12-28 NOTE — Telephone Encounter (Signed)
Patients care giver Anne Ng called to give Dr. Mariea Clonts an update on the new medications that patients has been taking she says patient says the Zoloft seems to be helping sum but he Xanax is not she is still having episodes of anxiety she has had a least 6 in the last to weeks

## 2019-12-29 NOTE — Telephone Encounter (Signed)
Called and spoke with patient about what Dr. Magdalene Molly prescribed for her to do I informed her because her care giver was not there at the time that if they had any questions they could call the office and we could repeat what was told to her

## 2019-12-31 ENCOUNTER — Other Ambulatory Visit: Payer: Self-pay

## 2019-12-31 ENCOUNTER — Other Ambulatory Visit (HOSPITAL_COMMUNITY): Payer: PPO

## 2019-12-31 ENCOUNTER — Inpatient Hospital Stay (HOSPITAL_COMMUNITY)
Admission: EM | Admit: 2019-12-31 | Discharge: 2020-01-06 | DRG: 286 | Disposition: A | Discharge status: Home Health | Payer: PPO | Attending: Internal Medicine | Admitting: Internal Medicine

## 2019-12-31 ENCOUNTER — Emergency Department (HOSPITAL_COMMUNITY): Payer: PPO

## 2019-12-31 ENCOUNTER — Encounter (HOSPITAL_COMMUNITY): Payer: Self-pay | Admitting: *Deleted

## 2019-12-31 DIAGNOSIS — Z681 Body mass index (BMI) 19 or less, adult: Secondary | ICD-10-CM | POA: Diagnosis not present

## 2019-12-31 DIAGNOSIS — J810 Acute pulmonary edema: Secondary | ICD-10-CM | POA: Diagnosis not present

## 2019-12-31 DIAGNOSIS — J440 Chronic obstructive pulmonary disease with acute lower respiratory infection: Secondary | ICD-10-CM | POA: Diagnosis not present

## 2019-12-31 DIAGNOSIS — J42 Unspecified chronic bronchitis: Secondary | ICD-10-CM | POA: Diagnosis present

## 2019-12-31 DIAGNOSIS — Z8249 Family history of ischemic heart disease and other diseases of the circulatory system: Secondary | ICD-10-CM

## 2019-12-31 DIAGNOSIS — R338 Other retention of urine: Secondary | ICD-10-CM | POA: Diagnosis present

## 2019-12-31 DIAGNOSIS — Z20822 Contact with and (suspected) exposure to covid-19: Secondary | ICD-10-CM | POA: Diagnosis present

## 2019-12-31 DIAGNOSIS — I130 Hypertensive heart and chronic kidney disease with heart failure and stage 1 through stage 4 chronic kidney disease, or unspecified chronic kidney disease: Secondary | ICD-10-CM | POA: Diagnosis not present

## 2019-12-31 DIAGNOSIS — I160 Hypertensive urgency: Secondary | ICD-10-CM | POA: Diagnosis not present

## 2019-12-31 DIAGNOSIS — R339 Retention of urine, unspecified: Secondary | ICD-10-CM | POA: Diagnosis present

## 2019-12-31 DIAGNOSIS — Z833 Family history of diabetes mellitus: Secondary | ICD-10-CM

## 2019-12-31 DIAGNOSIS — I447 Left bundle-branch block, unspecified: Secondary | ICD-10-CM | POA: Diagnosis present

## 2019-12-31 DIAGNOSIS — R0789 Other chest pain: Secondary | ICD-10-CM | POA: Diagnosis not present

## 2019-12-31 DIAGNOSIS — I340 Nonrheumatic mitral (valve) insufficiency: Secondary | ICD-10-CM | POA: Diagnosis present

## 2019-12-31 DIAGNOSIS — E43 Unspecified severe protein-calorie malnutrition: Secondary | ICD-10-CM | POA: Diagnosis not present

## 2019-12-31 DIAGNOSIS — J209 Acute bronchitis, unspecified: Secondary | ICD-10-CM | POA: Diagnosis present

## 2019-12-31 DIAGNOSIS — Z720 Tobacco use: Secondary | ICD-10-CM | POA: Diagnosis present

## 2019-12-31 DIAGNOSIS — I248 Other forms of acute ischemic heart disease: Secondary | ICD-10-CM | POA: Diagnosis not present

## 2019-12-31 DIAGNOSIS — J9601 Acute respiratory failure with hypoxia: Secondary | ICD-10-CM | POA: Diagnosis not present

## 2019-12-31 DIAGNOSIS — N179 Acute kidney failure, unspecified: Secondary | ICD-10-CM | POA: Diagnosis not present

## 2019-12-31 DIAGNOSIS — I483 Typical atrial flutter: Secondary | ICD-10-CM | POA: Diagnosis not present

## 2019-12-31 DIAGNOSIS — F418 Other specified anxiety disorders: Secondary | ICD-10-CM | POA: Diagnosis not present

## 2019-12-31 DIAGNOSIS — I499 Cardiac arrhythmia, unspecified: Secondary | ICD-10-CM | POA: Diagnosis not present

## 2019-12-31 DIAGNOSIS — J410 Simple chronic bronchitis: Secondary | ICD-10-CM

## 2019-12-31 DIAGNOSIS — R54 Age-related physical debility: Secondary | ICD-10-CM | POA: Diagnosis present

## 2019-12-31 DIAGNOSIS — F4310 Post-traumatic stress disorder, unspecified: Secondary | ICD-10-CM | POA: Diagnosis present

## 2019-12-31 DIAGNOSIS — R000 Tachycardia, unspecified: Secondary | ICD-10-CM | POA: Diagnosis not present

## 2019-12-31 DIAGNOSIS — M0600 Rheumatoid arthritis without rheumatoid factor, unspecified site: Secondary | ICD-10-CM | POA: Diagnosis present

## 2019-12-31 DIAGNOSIS — F419 Anxiety disorder, unspecified: Secondary | ICD-10-CM | POA: Diagnosis present

## 2019-12-31 DIAGNOSIS — K219 Gastro-esophageal reflux disease without esophagitis: Secondary | ICD-10-CM | POA: Diagnosis not present

## 2019-12-31 DIAGNOSIS — I7300 Raynaud's syndrome without gangrene: Secondary | ICD-10-CM | POA: Diagnosis present

## 2019-12-31 DIAGNOSIS — D631 Anemia in chronic kidney disease: Secondary | ICD-10-CM | POA: Diagnosis present

## 2019-12-31 DIAGNOSIS — R079 Chest pain, unspecified: Secondary | ICD-10-CM | POA: Diagnosis not present

## 2019-12-31 DIAGNOSIS — Z91040 Latex allergy status: Secondary | ICD-10-CM

## 2019-12-31 DIAGNOSIS — N758 Other diseases of Bartholin's gland: Secondary | ICD-10-CM | POA: Diagnosis not present

## 2019-12-31 DIAGNOSIS — I5021 Acute systolic (congestive) heart failure: Secondary | ICD-10-CM | POA: Diagnosis not present

## 2019-12-31 DIAGNOSIS — I480 Paroxysmal atrial fibrillation: Secondary | ICD-10-CM | POA: Diagnosis not present

## 2019-12-31 DIAGNOSIS — D7589 Other specified diseases of blood and blood-forming organs: Secondary | ICD-10-CM

## 2019-12-31 DIAGNOSIS — I2510 Atherosclerotic heart disease of native coronary artery without angina pectoris: Secondary | ICD-10-CM | POA: Diagnosis present

## 2019-12-31 DIAGNOSIS — I2729 Other secondary pulmonary hypertension: Secondary | ICD-10-CM | POA: Diagnosis not present

## 2019-12-31 DIAGNOSIS — Z7952 Long term (current) use of systemic steroids: Secondary | ICD-10-CM

## 2019-12-31 DIAGNOSIS — F329 Major depressive disorder, single episode, unspecified: Secondary | ICD-10-CM | POA: Diagnosis present

## 2019-12-31 DIAGNOSIS — E871 Hypo-osmolality and hyponatremia: Secondary | ICD-10-CM | POA: Diagnosis not present

## 2019-12-31 DIAGNOSIS — I5043 Acute on chronic combined systolic (congestive) and diastolic (congestive) heart failure: Secondary | ICD-10-CM | POA: Diagnosis present

## 2019-12-31 DIAGNOSIS — M069 Rheumatoid arthritis, unspecified: Secondary | ICD-10-CM | POA: Diagnosis not present

## 2019-12-31 DIAGNOSIS — R778 Other specified abnormalities of plasma proteins: Secondary | ICD-10-CM | POA: Diagnosis present

## 2019-12-31 DIAGNOSIS — M810 Age-related osteoporosis without current pathological fracture: Secondary | ICD-10-CM | POA: Diagnosis present

## 2019-12-31 DIAGNOSIS — N1831 Chronic kidney disease, stage 3a: Secondary | ICD-10-CM | POA: Diagnosis not present

## 2019-12-31 DIAGNOSIS — E538 Deficiency of other specified B group vitamins: Secondary | ICD-10-CM | POA: Diagnosis present

## 2019-12-31 DIAGNOSIS — Z807 Family history of other malignant neoplasms of lymphoid, hematopoietic and related tissues: Secondary | ICD-10-CM

## 2019-12-31 DIAGNOSIS — Z8669 Personal history of other diseases of the nervous system and sense organs: Secondary | ICD-10-CM

## 2019-12-31 DIAGNOSIS — F17210 Nicotine dependence, cigarettes, uncomplicated: Secondary | ICD-10-CM | POA: Diagnosis not present

## 2019-12-31 DIAGNOSIS — K529 Noninfective gastroenteritis and colitis, unspecified: Secondary | ICD-10-CM | POA: Diagnosis present

## 2019-12-31 DIAGNOSIS — Z888 Allergy status to other drugs, medicaments and biological substances status: Secondary | ICD-10-CM

## 2019-12-31 DIAGNOSIS — J441 Chronic obstructive pulmonary disease with (acute) exacerbation: Secondary | ICD-10-CM | POA: Diagnosis present

## 2019-12-31 DIAGNOSIS — F32A Depression, unspecified: Secondary | ICD-10-CM | POA: Diagnosis present

## 2019-12-31 DIAGNOSIS — I213 ST elevation (STEMI) myocardial infarction of unspecified site: Secondary | ICD-10-CM | POA: Diagnosis not present

## 2019-12-31 DIAGNOSIS — Z885 Allergy status to narcotic agent status: Secondary | ICD-10-CM

## 2019-12-31 DIAGNOSIS — Z79899 Other long term (current) drug therapy: Secondary | ICD-10-CM

## 2019-12-31 DIAGNOSIS — J449 Chronic obstructive pulmonary disease, unspecified: Secondary | ICD-10-CM | POA: Diagnosis present

## 2019-12-31 DIAGNOSIS — I5031 Acute diastolic (congestive) heart failure: Secondary | ICD-10-CM | POA: Diagnosis not present

## 2019-12-31 DIAGNOSIS — Z881 Allergy status to other antibiotic agents status: Secondary | ICD-10-CM

## 2019-12-31 DIAGNOSIS — R7989 Other specified abnormal findings of blood chemistry: Secondary | ICD-10-CM | POA: Diagnosis not present

## 2019-12-31 DIAGNOSIS — R0602 Shortness of breath: Secondary | ICD-10-CM | POA: Diagnosis not present

## 2019-12-31 DIAGNOSIS — R0603 Acute respiratory distress: Secondary | ICD-10-CM | POA: Diagnosis present

## 2019-12-31 LAB — BASIC METABOLIC PANEL
Anion gap: 12 (ref 5–15)
BUN: 19 mg/dL (ref 8–23)
CO2: 20 mmol/L — ABNORMAL LOW (ref 22–32)
Calcium: 8.6 mg/dL — ABNORMAL LOW (ref 8.9–10.3)
Chloride: 100 mmol/L (ref 98–111)
Creatinine, Ser: 1.08 mg/dL — ABNORMAL HIGH (ref 0.44–1.00)
GFR calc Af Amer: 59 mL/min — ABNORMAL LOW (ref >60)
GFR calc non Af Amer: 51 mL/min — ABNORMAL LOW (ref >60)
Glucose, Bld: 169 mg/dL — ABNORMAL HIGH (ref 70–99)
Potassium: 3.8 mmol/L (ref 3.5–5.1)
Sodium: 132 mmol/L — ABNORMAL LOW (ref 135–145)

## 2019-12-31 LAB — HEPATIC FUNCTION PANEL
ALT: 44 U/L (ref 0–44)
AST: 28 U/L (ref 15–41)
Albumin: 3.2 g/dL — ABNORMAL LOW (ref 3.5–5.0)
Alkaline Phosphatase: 44 U/L (ref 38–126)
Bilirubin, Direct: 0.1 mg/dL (ref 0.0–0.2)
Indirect Bilirubin: 0.6 mg/dL (ref 0.3–0.9)
Total Bilirubin: 0.7 mg/dL (ref 0.3–1.2)
Total Protein: 6.4 g/dL — ABNORMAL LOW (ref 6.5–8.1)

## 2019-12-31 LAB — MAGNESIUM: Magnesium: 1.8 mg/dL (ref 1.7–2.4)

## 2019-12-31 LAB — CBC WITH DIFFERENTIAL/PLATELET
Abs Immature Granulocytes: 0 10*3/uL (ref 0.00–0.07)
Basophils Absolute: 0.2 10*3/uL — ABNORMAL HIGH (ref 0.0–0.1)
Basophils Relative: 1 %
Eosinophils Absolute: 0 10*3/uL (ref 0.0–0.5)
Eosinophils Relative: 0 %
HCT: 35.7 % — ABNORMAL LOW (ref 36.0–46.0)
Hemoglobin: 12.2 g/dL (ref 12.0–15.0)
Lymphocytes Relative: 30 %
Lymphs Abs: 4.7 10*3/uL — ABNORMAL HIGH (ref 0.7–4.0)
MCH: 35.4 pg — ABNORMAL HIGH (ref 26.0–34.0)
MCHC: 34.2 g/dL (ref 30.0–36.0)
MCV: 103.5 fL — ABNORMAL HIGH (ref 80.0–100.0)
Monocytes Absolute: 0.2 10*3/uL (ref 0.1–1.0)
Monocytes Relative: 1 %
Neutro Abs: 10.6 10*3/uL — ABNORMAL HIGH (ref 1.7–7.7)
Neutrophils Relative %: 68 %
Platelets: 267 10*3/uL (ref 150–400)
RBC: 3.45 MIL/uL — ABNORMAL LOW (ref 3.87–5.11)
RDW: 14.3 % (ref 11.5–15.5)
WBC: 15.6 10*3/uL — ABNORMAL HIGH (ref 4.0–10.5)
nRBC: 0 % (ref 0.0–0.2)
nRBC: 0 /100 WBC

## 2019-12-31 LAB — RESPIRATORY PANEL BY RT PCR (FLU A&B, COVID)
Influenza A by PCR: NEGATIVE
Influenza B by PCR: NEGATIVE
SARS Coronavirus 2 by RT PCR: NEGATIVE

## 2019-12-31 LAB — TROPONIN I (HIGH SENSITIVITY)
Troponin I (High Sensitivity): 72 ng/L — ABNORMAL HIGH (ref <18)
Troponin I (High Sensitivity): 113 ng/L (ref <18)

## 2019-12-31 LAB — TSH: TSH: 2.889 u[IU]/mL (ref 0.350–4.500)

## 2019-12-31 LAB — PROCALCITONIN: Procalcitonin: <0.1 ng/mL

## 2019-12-31 LAB — BRAIN NATRIURETIC PEPTIDE: B Natriuretic Peptide: 3381.8 pg/mL — ABNORMAL HIGH (ref 0.0–100.0)

## 2019-12-31 MED ORDER — SODIUM CHLORIDE 0.9 % IV SOLN: 250.0000 mL | INTRAVENOUS | Status: DC | PRN

## 2019-12-31 MED ORDER — NITROGLYCERIN 2 % TD OINT
1.0000 [in_us] | TOPICAL_OINTMENT | Freq: Once | TRANSDERMAL | Status: AC
  Administered 2019-12-31: 1 [in_us] via TOPICAL
  Filled 2019-12-31: qty 1

## 2019-12-31 MED ORDER — LEVALBUTEROL HCL 0.63 MG/3ML IN NEBU: 0.6300 mg | INHALATION_SOLUTION | RESPIRATORY_TRACT | Status: DC | PRN

## 2019-12-31 MED ORDER — LEVALBUTEROL HCL 0.63 MG/3ML IN NEBU
0.6300 mg | INHALATION_SOLUTION | Freq: Four times a day (QID) | RESPIRATORY_TRACT | Status: DC
  Filled 2019-12-31: qty 3

## 2019-12-31 MED ORDER — SERTRALINE HCL 50 MG PO TABS: 50.0000 mg | ORAL_TABLET | Freq: Every day | ORAL | Status: DC

## 2019-12-31 MED ORDER — METHYLPREDNISOLONE SODIUM SUCC 125 MG IJ SOLR
60.0000 mg | Freq: Three times a day (TID) | INTRAMUSCULAR | Status: DC
  Administered 2019-12-31 – 2020-01-02 (×6): 60 mg via INTRAVENOUS
  Filled 2019-12-31 (×6): qty 2

## 2019-12-31 MED ORDER — DILTIAZEM HCL ER COATED BEADS 360 MG PO CP24
360.0000 mg | ORAL_CAPSULE | Freq: Every day | ORAL | Status: DC
  Administered 2019-12-31: 360 mg via ORAL
  Filled 2019-12-31: qty 1

## 2019-12-31 MED ORDER — SODIUM CHLORIDE 0.9% FLUSH
3.0000 mL | Freq: Two times a day (BID) | INTRAVENOUS | Status: DC
  Administered 2019-12-31 – 2020-01-02 (×6): 3 mL via INTRAVENOUS

## 2019-12-31 MED ORDER — TRAZODONE HCL 50 MG PO TABS
50.0000 mg | ORAL_TABLET | Freq: Once | ORAL | Status: AC
  Administered 2019-12-31: 22:00:00 50 mg via ORAL
  Filled 2019-12-31: qty 1

## 2019-12-31 MED ORDER — DILTIAZEM HCL ER BEADS 240 MG PO CP24: 360.0000 mg | ORAL_CAPSULE | Freq: Every day | ORAL | Status: DC

## 2019-12-31 MED ORDER — POTASSIUM CHLORIDE CRYS ER 20 MEQ PO TBCR
40.0000 meq | EXTENDED_RELEASE_TABLET | ORAL | Status: AC
  Administered 2019-12-31: 40 meq via ORAL
  Filled 2019-12-31: qty 2

## 2019-12-31 MED ORDER — FUROSEMIDE 10 MG/ML IJ SOLN
40.0000 mg | Freq: Two times a day (BID) | INTRAMUSCULAR | Status: DC
  Administered 2019-12-31 – 2020-01-01 (×2): 40 mg via INTRAVENOUS
  Filled 2019-12-31 (×2): qty 4

## 2019-12-31 MED ORDER — DILTIAZEM HCL 25 MG/5ML IV SOLN
5.0000 mg | INTRAVENOUS | Status: AC
  Administered 2019-12-31: 09:00:00 5 mg via INTRAVENOUS
  Filled 2019-12-31: qty 5

## 2019-12-31 MED ORDER — FUROSEMIDE 10 MG/ML IJ SOLN
40.0000 mg | Freq: Once | INTRAMUSCULAR | Status: AC
  Administered 2019-12-31: 08:00:00 40 mg via INTRAVENOUS
  Filled 2019-12-31: qty 4

## 2019-12-31 MED ORDER — HYDROXYCHLOROQUINE SULFATE 200 MG PO TABS
200.0000 mg | ORAL_TABLET | Freq: Every day | ORAL | Status: DC
  Administered 2019-12-31 – 2020-01-06 (×7): 200 mg via ORAL
  Filled 2019-12-31 (×7): qty 1

## 2019-12-31 MED ORDER — CARVEDILOL 12.5 MG PO TABS
6.2500 mg | ORAL_TABLET | Freq: Two times a day (BID) | ORAL | Status: DC
  Administered 2019-12-31: 13:00:00 6.25 mg via ORAL
  Filled 2019-12-31: qty 1

## 2019-12-31 MED ORDER — ACETAMINOPHEN 325 MG PO TABS
650.0000 mg | ORAL_TABLET | ORAL | Status: DC | PRN
  Administered 2019-12-31 – 2020-01-05 (×4): 650 mg via ORAL
  Filled 2019-12-31 (×4): qty 2

## 2019-12-31 MED ORDER — DILTIAZEM HCL 25 MG/5ML IV SOLN
10.0000 mg | Freq: Once | INTRAVENOUS | Status: AC
  Administered 2019-12-31: 22:00:00 10 mg via INTRAVENOUS
  Filled 2019-12-31: qty 5

## 2019-12-31 MED ORDER — METOPROLOL TARTRATE 5 MG/5ML IV SOLN
2.5000 mg | Freq: Once | INTRAVENOUS | Status: AC
  Administered 2019-12-31: 14:00:00 2.5 mg via INTRAVENOUS
  Filled 2019-12-31: qty 5

## 2019-12-31 MED ORDER — ENOXAPARIN SODIUM 30 MG/0.3ML ~~LOC~~ SOLN
30.0000 mg | SUBCUTANEOUS | Status: DC
  Administered 2019-12-31 – 2020-01-01 (×2): 30 mg via SUBCUTANEOUS
  Filled 2019-12-31 (×2): qty 0.3

## 2019-12-31 MED ORDER — ALPRAZOLAM 0.25 MG PO TABS
0.2500 mg | ORAL_TABLET | Freq: Two times a day (BID) | ORAL | Status: DC | PRN
  Administered 2019-12-31 – 2020-01-02 (×5): 0.25 mg via ORAL
  Filled 2019-12-31 (×4): qty 1

## 2019-12-31 MED ORDER — CALCIUM CARBONATE 1250 (500 CA) MG PO TABS
500.0000 mg | ORAL_TABLET | Freq: Every day | ORAL | Status: DC
  Administered 2020-01-01 – 2020-01-06 (×5): 500 mg via ORAL
  Filled 2019-12-31 (×6): qty 1

## 2019-12-31 MED ORDER — NICOTINE 7 MG/24HR TD PT24
7.0000 mg | MEDICATED_PATCH | Freq: Every day | TRANSDERMAL | Status: DC
  Administered 2019-12-31 – 2020-01-06 (×7): 7 mg via TRANSDERMAL
  Filled 2019-12-31 (×7): qty 1

## 2019-12-31 MED ORDER — ENOXAPARIN SODIUM 40 MG/0.4ML ~~LOC~~ SOLN: 40.0000 mg | SUBCUTANEOUS | Status: DC

## 2019-12-31 MED ORDER — BUSPIRONE HCL 10 MG PO TABS
30.0000 mg | ORAL_TABLET | Freq: Two times a day (BID) | ORAL | Status: DC
  Administered 2019-12-31 – 2020-01-06 (×13): 30 mg via ORAL
  Filled 2019-12-31 (×12): qty 3

## 2019-12-31 MED ORDER — SODIUM CHLORIDE 0.9% FLUSH: 3.0000 mL | INTRAVENOUS | Status: DC | PRN

## 2019-12-31 NOTE — Progress Notes (Signed)
Patient's HR is 145.  She is stressed but is not SOB or wheezing.  Ms. Tripi does not use HHN at home.  HHN will be changed to PRN

## 2019-12-31 NOTE — ED Triage Notes (Signed)
The pt arrived by gems from home  She lives alonesob since [phone] chest pain rales all lung fileds 02 sats low   Iv lt a-c  Ms 8mg  aspirin 324mg  and nitro x2 by ems    Non-rebreather on arrical 15 liters  zofran 4 ng iv high bp

## 2019-12-31 NOTE — Consult Note (Signed)
Responded to page, pt unavailable, no family present, staff will page again if further chaplain services needed.  Rev. Aleksi Brummet Chaplain 

## 2019-12-31 NOTE — ED Provider Notes (Signed)
[facility] EMERGENCY DEPARTMENT Provider Note   CSN: [id number] Arrival date & time: 12/31/19  0419   History Chief Complaint  Patient presents with  . Code STEMI    Regina Ortiz is a 74 y.o. female.  The history is provided by the patient.  She has history of hypertension, COPD and comes in because of severe dyspnea which started tonight.  She states that it came on over approximately 1 hour period.  She denies chest pain, heaviness, tightness, pressure.  There was some nausea but no vomiting.  She has had diaphoresis.  She denies any cough.  EMS noted hypoxia and respiratory distress.  ECG showed some ST elevation and code STEMI was activated.  She was placed on oxygen via nonrebreather mask with adequate oxygen saturations following that.  Past Medical History:  Diagnosis Date  . AIN grade I   . Anemia   . Anxiety   . Bruises easily   . Chronic diarrhea   . Chronic hyponatremia   . Colitis   . COPD (chronic obstructive pulmonary disease) ([place])   . Depression   . GERD (gastroesophageal reflux disease)   . History of chronic bronchitis   . History of vulvar dysplasia    serveral recurrency's   . Hypertension   . Insomnia   . LBBB (left bundle branch block) W/ PROLONGED PR   CARDIOLOGSIT-  DR Johnsie Cancel-   LOV IN EPIC  . Macrocytosis   . Major depressive disorder   . Nocturia   . Osteoporosis   . Raynaud's disease   . Sciatica    right side  . Seronegative rheumatoid arthritis ([place])   . Short of breath on exertion   . Smokers' cough ([place])   . Venous insufficiency of leg    , Edema  . VIN III (vulvar intraepithelial neoplasia III)    RECURRENT -  . Vitamin B12 deficiency     Patient Active Problem List   Diagnosis Date Noted  . Osteopenia 11/01/2019  . Protein-calorie malnutrition, severe 08/19/2019  . Urinary frequency 04/15/2019  . Senile osteoporosis   . Hyperlipidemia   . Adrenal insufficiency ([place]) 04/13/2019  . Uncontrolled  hypertension 04/13/2019  . Bilateral lower extremity edema 04/13/2019  . UTI (urinary tract infection) 04/02/2019  . Pain on right lower extremity 03/31/2019  . Prolonged Q-T interval on ECG 03/22/2019  . Acute blood loss anemia 03/21/2019  . Pressure injury of skin 03/15/2019  . Fracture of femoral neck, right, closed ([place]) 03/14/2019  . Fall at home, initial encounter 03/12/2019  . Urinary retention 02/15/2019  . Lumbosacral radiculopathy 02/15/2019  . Constipation 02/15/2019  . Acute urinary retention 02/15/2019  . Cauda equina syndrome ([place]) 01/28/2019  . Chest pain 09/25/2017  . Hypertensive urgency 09/24/2017  . Headache 09/24/2017  . Alcohol use 09/24/2017  . AKI (acute kidney injury) ([place]) 09/24/2017  . Bilateral carotid artery stenosis   . Vulvovaginal candidiasis 10/22/2015  . Mild malnutrition ([place]) 07/07/2013  . Chronic bronchitis ([place]) 07/07/2013  . Tobacco abuse 07/07/2013  . Hypertension   . Short of breath on exertion   . Hemorrhoid   . LBBB (left bundle branch block)   . Anxiety   . Depression   . RA (rheumatoid arthritis) ([place])   . Venous insufficiency of leg   . Chronic hyponatremia   . AIN grade I 04/05/2013  . Diarrhea 09/22/2012  . Hyponatremia 09/22/2012  . Hypokalemia 09/22/2012  . ARF (acute renal failure) ([place]) 09/22/2012  .  Atypical chest pain 06/22/2012  . Vulvar intraepithelial neoplasia III (VIN III) 01/29/2012    Past Surgical History:  Procedure Laterality Date  . APPENDECTOMY  1992  . BIOPSY  08/19/2019   Procedure: BIOPSY;  Surgeon: [name], MD;  Location: [facility];  Service: Gastroenterology;;  . CARDIOVASCULAR STRESS TEST  06-23-2012  DR Jenkins Rouge   NORMAL LEXISCAN MYOVIEW/ EF 83%/  NO ISCHEMIA  . CO2 LASER APPLICATION  [id number]   Procedure: CO2 LASER APPLICATION;  Surgeon: Janie Morning, MD PHD;  Location: [facility];  Service: Gynecology;  Laterality: N/A;  Laser Vaporization  . CO2 LASER  APPLICATION N/A [id number]   Procedure: CO2 LASER APPLICATION;  Surgeon: [name], MD;  Location: [facility];  Service: Gynecology;  Laterality: N/A;  . CO2 LASER APPLICATION N/A [id number]   Procedure: CO2 LASER APPLICATION of the vulva;  Surgeon: Janie Morning, MD;  Location: [facility];  Service: Gynecology;  Laterality: N/A;  . COLONOSCOPY WITH PROPOFOL N/A 08/19/2019   Procedure: COLONOSCOPY WITH PROPOFOL;  Surgeon: [name], MD;  Location: [facility];  Service: Gastroenterology;  Laterality: N/A;  . ESOPHAGOGASTRODUODENOSCOPY (EGD) WITH PROPOFOL N/A 08/19/2019   Procedure: ESOPHAGOGASTRODUODENOSCOPY (EGD) WITH PROPOFOL;  Surgeon: [name], MD;  Location: [facility];  Service: Gastroenterology;  Laterality: N/A;  . EXCISION VULVAR LESIONS  07/2012  . HEMORRHOID SURGERY N/A 04/15/2013   Procedure: Excision of external and internal  hemorrhoid with AIN, Exam under anesthesia;  Surgeon: Odis Hollingshead, MD;  Location: [facility];  Service: General;  Laterality: N/A;  . HIP PINNING,CANNULATED Right 03/15/2019   Procedure: Maebelle Munroe OF FEMORAL NECK FRACTURE;  Surgeon: Renette Butters, MD;  Location: [facility];  Service: Orthopedics;  Laterality: Right;  . POLYPECTOMY  08/19/2019   Procedure: POLYPECTOMY;  Surgeon: [name], MD;  Location: [facility];  Service: Gastroenterology;;  . REPAIR FISTULA IN ANO/  I & D PERIRECTAL ABSCESS  10-26-2002  DR Johnathan Hausen  . TRANSTHORACIC ECHOCARDIOGRAM  06-23-2012   NORMAL LVSF/ EF 55-60%  . VAGINAL HYSTERECTOMY  1997   partial  . VULVAR LESION REMOVAL N/A 11/03/2014   Procedure: CO2 LASER OF VULVAR ;  Surgeon: [name], MD;  Location: [facility];  Service: Gynecology;  Laterality: N/A;  . VULVECTOMY N/A 03/26/2015   Procedure: WIDE LOCA  EXCISION OF VULVAR;  Surgeon: [name], MD;  Location: [facility];  Service: Gynecology;  Laterality: N/A;  . Wide Local Excision of labia majora   04-04-2010   Right-sided lesion, CO2 ablation of right labia minora     OB History   No obstetric history on file.     Family History  Problem Relation Age of Onset  . Diabetes Mother   . Hypertension Father   . Heart attack Father 78  . Lymphoma Son   . Cancer Son        lymphoblastic lymphoma    Social History   Tobacco Use  . Smoking status: Current Every Day Smoker    Types: Cigarettes  . Smokeless tobacco: Never Used  . Tobacco comment: 2 cigarettes a day  Substance Use Topics  . Alcohol use: Not Currently    Alcohol/week: 2.0 standard drinks    Types: 2 Cans of beer per week  . Drug use: No    Home Medications Prior to Admission medications   Medication Sig Start Date End Date Taking? Authorizing Provider  acetaminophen (TYLENOL) 325 MG tablet Take 325 mg  by mouth every 6 (six) hours as needed. 2 tablets    [provider]  ALPRAZolam (XANAX) 0.25 MG tablet Take 1 tablet (0.25 mg total) by mouth 2 (two) times daily as needed for anxiety. 12/12/19   Reed, Tiffany L, DO  busPIRone (BUSPAR) 30 MG tablet Take by mouth 2 (two) times daily.    [provider]  calcium carbonate (OSCAL) 1500 (600 Ca) MG TABS tablet Take 600 mg of elemental calcium by mouth daily.    [provider]  denosumab (PROLIA) 60 MG/ML SOSY injection Inject 60 mg into the skin every 6 (six) months.    [provider]  diltiazem (CARDIZEM) 10 mg/ml oral suspension Take 360 mg by mouth daily at 6 (six) AM.    [provider]  ferrous sulfate 325 (65 FE) MG tablet Take 325 mg by mouth daily with breakfast.    [provider]  hydroxychloroquine (PLAQUENIL) 200 MG tablet Take 200 mg by mouth daily. 11/27/19   [provider]  polyethylene glycol (MIRALAX / GLYCOLAX) 17 g packet Take 17 g by mouth as needed.     [provider]  Potassium 75 MG TABS Take 75 mg by mouth daily.     [provider]  predniSONE (DELTASONE) 5 MG  tablet Take 5 mg by mouth daily.    [provider]  Prenatal Vit-Fe Fumarate-FA (MULTIVITAMIN-PRENATAL) 27-0.8 MG TABS tablet Take 1 tablet by mouth daily at 12 noon.    [provider]  sertraline (ZOLOFT) 50 MG tablet Take 1 tablet (50 mg total) by mouth daily. 12/12/19   Reed, Tiffany L, DO    Allergies    Latex, Augmentin [amoxicillin-pot clavulanate], Doxycycline, Hydrocodone, Lactose intolerance (gi), Lialda [mesalamine], Prinivil [lisinopril], Triamcinolone acetonide, Amlodipine, and Lotensin [benazepril hcl]  Review of Systems   Review of Systems  All other systems reviewed and are negative.   Physical Exam Updated Vital Signs BP (!) 161/102   Pulse (!) 123   Temp 98.1 F (36.7 C) (Oral)   Resp 11   Ht 5\' 4"  (1.626 m)   Wt 41.2 kg   LMP  (LMP Unknown)   SpO2 96%   BMI 15.59 kg/m   Physical Exam Vitals and nursing note reviewed.   74 year old female, in moderate respiratory distress. Vital signs are significant for elevated blood pressure and heart rate. Oxygen saturation is 96%, which is normal. Head is normocephalic and atraumatic. PERRLA, EOMI. Oropharynx is clear. Neck is nontender and supple without adenopathy. JVD is present at 90 degrees. Back is nontender and there is no CVA tenderness. Lungs have diffuse rales and coarse expiratory rhonchi.  There are are no wheezes. Chest is nontender. Heart has regular rate and rhythm without murmur. Abdomen is soft, flat, nontender without masses or hepatosplenomegaly and peristalsis is normoactive. Extremities have no cyanosis or edema, full range of motion is present. Skin is warm and dry without rash. Neurologic: Mental status is normal, cranial nerves are intact, there are no motor or sensory deficits.  ED Results / Procedures / Treatments   Labs (all labs ordered are listed, but only abnormal results are displayed) Labs Reviewed  BASIC METABOLIC PANEL - Abnormal; Notable for the following  components:      Result Value   Sodium 132 (*)    CO2 20 (*)    Glucose, Bld 169 (*)    Creatinine, Ser 1.08 (*)    Calcium 8.6 (*)    GFR calc non  Af Amer 51 (*)    GFR calc Af Amer 59 (*)    All other components within normal limits  BRAIN NATRIURETIC PEPTIDE - Abnormal; Notable for the following components:   B Natriuretic Peptide 3,381.8 (*)    All other components within normal limits  CBC WITH DIFFERENTIAL/PLATELET - Abnormal; Notable for the following components:   WBC 15.6 (*)    RBC 3.45 (*)    HCT 35.7 (*)    MCV 103.5 (*)    MCH 35.4 (*)    Neutro Abs 10.6 (*)    Lymphs Abs 4.7 (*)    Basophils Absolute 0.2 (*)    All other components within normal limits  TROPONIN I (HIGH SENSITIVITY) - Abnormal; Notable for the following components:   Troponin I (High Sensitivity) 72 (*)    All other components within normal limits  RESPIRATORY PANEL BY RT PCR (FLU A&B, COVID)  TROPONIN I (HIGH SENSITIVITY)    EKG EKG Interpretation  Date/Time:  Saturday Dec 31 2019 04:21:51 EDT Ventricular Rate:  120 PR Interval:    QRS Duration: 142 QT Interval:  350 QTC Calculation: 465 R Axis:   43 Text Interpretation: Sinus tachycardia Supraventricular bigeminy Probable left atrial enlargement IVCD, consider atypical LBBB When compared with ECG of 08/17/2019, HEART RATE has increased Confirmed by Delora Fuel ([id number]) on 12/31/2019 4:30:11 AM   Radiology DG Chest Port 1 View  Result Date: 12/31/2019 CLINICAL DATA:  Shortness of breath EXAM: PORTABLE CHEST 1 VIEW COMPARISON:  03/12/2019 FINDINGS: The lungs are hyperinflated with diffuse interstitial prominence. No pleural effusion or pneumothorax. Normal cardiomediastinal contours. Reticular opacities at both lung bases. IMPRESSION: COPD without focal airspace disease. Reticular opacities at both lung bases may indicate pulmonary edema or developing infection. Electronically Signed   By: Ulyses Jarred M.D.   On: 12/31/2019 05:26     Procedures Procedures  CRITICAL CARE Performed by: Delora Fuel Total critical care time: 95 minutes Critical care time was exclusive of separately billable procedures and treating other patients. Critical care was necessary to treat or prevent imminent or life-threatening deterioration. Critical care was time spent personally by me on the following activities: development of treatment plan with patient and/or surrogate as well as nursing, discussions with consultants, evaluation of patient's response to treatment, examination of patient, obtaining history from patient or surrogate, ordering and performing treatments and interventions, ordering and review of laboratory studies, ordering and review of radiographic studies, pulse oximetry and re-evaluation of patient's condition.  Medications Ordered in ED Medications  nitroGLYCERIN (NITROGLYN) 2 % ointment 1 inch (1 inch Topical Given 12/31/19 0436)    ED Course  I have reviewed the triage vital signs and the nursing notes.  Pertinent labs & imaging results that were available during my care of the patient were reviewed by me and considered in my medical decision making (see chart for details).  MDM Rules/Calculators/A&P Respiratory distress which appears to be pulmonary edema.  She does not have a history of that.  Blood pressure is markedly elevated and this may be the underlying cause.  ECG shows left bundle branch block which is pre-existing and does not represent a STEMI.  Code STEMI was canceled on arrival.  She is continued on a nonrebreather oxygen mask and x-ray and screening labs are obtained.  Old records are reviewed, and she has no relevant past visits.  5:20 AM Chest x-ray shows some mild to moderate pulmonary edema.  She is given a dose of furosemide.  6:15  AM Patient is now resting comfortably and no longer using accessory muscles of respiration.  Labs show mildly elevated troponin labs show mildly elevated troponin which  is felt to represent demand ischemia and not ACS.  BNP is markedly elevated consistent with acute heart failure.  Also, she is noted to have macrocytosis of uncertain significance.  Case is discussed with Dr. Myna Hidalgo of [facility] hospital[facility], who agrees to admit the patient.  Final Clinical Impression(s) / ED Diagnoses Final diagnoses:  Acute pulmonary edema (HCC)  Elevated troponin  Macrocytosis without anemia    Rx / DC Orders ED Discharge Orders    None       Delora Fuel, MD [id number] [phone]

## 2019-12-31 NOTE — ED Notes (Signed)
Cancelled code stemi per RN Gerald Stabs

## 2019-12-31 NOTE — H&P (Signed)
History and Physical    Regina Ortiz [id number] DOB: 07-13-1946 DOA: 12/31/2019  Referring MD/NP/PA: Mitzi Hansen, MD PCP: Gayland Curry, DO  Patient coming from: Home(lives alone) via EMS  Chief Complaint: Shortness of breath  I have personally briefly reviewed patient's old medical records in [place]   HPI: Regina Ortiz is a 74 y.o. female with medical history significant of hypertension, COPD, chronic hyponatremia, LBBB, rheumatoid arthritis, Raynaud's disease, neurogenic bladder, cauda equina syndrome  S/P SURGICAL decompression in 01/28/2019 by Dr. Ellene Route, anemia, anxiety, depression, tobacco abuse, and GERD presents with complaints of shortness of breath.  She had been up all night as she was sweating profusely and having to change her shirt.  Thereafter, developed progressively worsening shortness of breath.  Associated symptoms included wheezing, very minimal cough, leg swelling, and later develops substernal chest pressure without radiation.  Patient continues to smoke  a couple cigarettes throughout the week, but would like to quit.  She feels that her anxiety has worsened despite her Zoloft dose being recently increased from 50 mg to 100 mg daily a couple days ago.  At baseline patient reports that she is not on oxygen.  En route with EMS patient was noted to be hypoxic and was initially placed on a nonrebreather.  She received 324 mg of aspirin and 2 nitroglycerin prior to arrival.  ED Course: She presented as a code STEMI, but EKG was noted to show a left bundle branch block and code STEMI was canceled.  Patient was noted to be afebrile, pulse elevated up to 167, respirations elevated up to 26, blood pressure 161/102-190 9/129, and O2 saturations maintained on nonrebreather.  Labs significant for WBC 15.6, sodium 132, BUN 19, creatinine 1.08, glucose 169, BNP 3381.8, and troponin 72.  Chest x-ray revealed hyperinflated lungs with reticular bibasilar opacities  concerning for edema versus infection.  Patient was given 40 mg of Lasix IV and nitroglycerin ointment placed.  Review of Systems  Constitutional: Positive for diaphoresis and malaise/fatigue. Negative for fever.  HENT: Negative for congestion and nosebleeds.   Eyes: Negative for photophobia and pain.  Respiratory: Positive for cough, shortness of breath and wheezing.   Cardiovascular: Positive for chest pain, palpitations and leg swelling.  Gastrointestinal: Negative for abdominal pain, nausea and vomiting.  Genitourinary: Negative for dysuria and hematuria.  Musculoskeletal: Negative for falls.  Skin: Negative for rash.  Neurological: Positive for weakness. Negative for dizziness and loss of consciousness.  Psychiatric/Behavioral: The patient is nervous/anxious and has insomnia.     Past Medical History:  Diagnosis Date  . AIN grade I   . Anemia   . Anxiety   . Bruises easily   . Chronic diarrhea   . Chronic hyponatremia   . Colitis   . COPD (chronic obstructive pulmonary disease) ([place])   . Depression   . GERD (gastroesophageal reflux disease)   . History of chronic bronchitis   . History of vulvar dysplasia    serveral recurrency's   . Hypertension   . Insomnia   . LBBB (left bundle branch block) W/ PROLONGED PR   CARDIOLOGSIT-  DR Johnsie Cancel-   LOV IN EPIC  . Macrocytosis   . Major depressive disorder   . Nocturia   . Osteoporosis   . Raynaud's disease   . Sciatica    right side  . Seronegative rheumatoid arthritis ([place])   . Short of breath on exertion   . Smokers' cough ([place])   . Venous insufficiency of  leg    , Edema  . VIN III (vulvar intraepithelial neoplasia III)    RECURRENT -  . Vitamin B12 deficiency     Past Surgical History:  Procedure Laterality Date  . APPENDECTOMY  1992  . BIOPSY  08/19/2019   Procedure: BIOPSY;  Surgeon: [name], MD;  Location: [place];  Service: Gastroenterology;;  . CARDIOVASCULAR STRESS TEST  06-23-2012  DR Jenkins Rouge   NORMAL LEXISCAN MYOVIEW/ EF 83%/  NO ISCHEMIA  . CO2 LASER APPLICATION  [id number]   Procedure: CO2 LASER APPLICATION;  Surgeon: Janie Morning, MD PHD;  Location: [facility];  Service: Gynecology;  Laterality: N/A;  Laser Vaporization  . CO2 LASER APPLICATION N/A [id number]   Procedure: CO2 LASER APPLICATION;  Surgeon: [name], MD;  Location: [facility];  Service: Gynecology;  Laterality: N/A;  . CO2 LASER APPLICATION N/A [id number]   Procedure: CO2 LASER APPLICATION of the vulva;  Surgeon: Janie Morning, MD;  Location[facility];  Service: Gynecology;  Laterality: N/A;  . COLONOSCOPY WITH PROPOFOL N/A 08/19/2019   Procedure: COLONOSCOPY WITH PROPOFOL;  Surgeon: [name], MD;  Location: [place];  Service: Gastroenterology;  Laterality: N/A;  . ESOPHAGOGASTRODUODENOSCOPY (EGD) WITH PROPOFOL N/A 08/19/2019   Procedure: ESOPHAGOGASTRODUODENOSCOPY (EGD) WITH PROPOFOL;  Surgeon: [name], MD;  Location: [place];  Service: Gastroenterology;  Laterality: N/A;  . EXCISION VULVAR LESIONS  07/2012  . HEMORRHOID SURGERY N/A 04/15/2013   Procedure: Excision of external and internal  hemorrhoid with AIN, Exam under anesthesia;  Surgeon: Odis Hollingshead, MD;  Location: [facility];  Service: General;  Laterality: N/A;  . HIP PINNING,CANNULATED Right 03/15/2019   Procedure: Maebelle Munroe OF FEMORAL NECK FRACTURE;  Surgeon: Renette Butters, MD;  Location: [facility];  Service: Orthopedics;  Laterality: Right;  . POLYPECTOMY  08/19/2019   Procedure: POLYPECTOMY;  Surgeon: [name], MD;  Location: [facility];  Service: Gastroenterology;;  . REPAIR FISTULA IN ANO/  I & D PERIRECTAL ABSCESS  10-26-2002  DR Johnathan Hausen  . TRANSTHORACIC ECHOCARDIOGRAM  06-23-2012   NORMAL LVSF/ EF 55-60%  . VAGINAL HYSTERECTOMY  1997   partial  . VULVAR LESION REMOVAL N/A 11/03/2014   Procedure: CO2 LASER OF VULVAR ;  Surgeon: [name], MD;  Location[facility];  Service: Gynecology;  Laterality: N/A;  . VULVECTOMY N/A 03/26/2015   Procedure: WIDE LOCA  EXCISION OF VULVAR;  Surgeon: [name], MD;  Location: [place];  Service: Gynecology;  Laterality: N/A;  . Wide Local Excision of labia majora  04-04-2010   Right-sided lesion, CO2 ablation of right labia minora     reports that she has been smoking cigarettes. She has never used smokeless tobacco. She reports previous alcohol use of about 2.0 standard drinks of alcohol per week. She reports that she does not use drugs.  Allergies  Allergen Reactions  . Latex Swelling and Other (See Comments)     fever blisters  . Augmentin [Amoxicillin-Pot Clavulanate] Nausea And Vomiting  . Doxycycline Other (See Comments)    Blurred vision, nervous, unsteady  . Hydrocodone Itching    Acetaminophen:itching, confusion, hallucinations-side effects-Onset date 07/15/2018  . Lactose Intolerance (Gi) Other (See Comments)    Unknown, per pt  . Lialda [Mesalamine] Diarrhea  . Prinivil [Lisinopril] Diarrhea  . Triamcinolone Acetonide Other (See Comments)    REDNESS AND PAIN  . Amlodipine Rash  . Lotensin [Benazepril Hcl] Rash    Family History  Problem Relation Age of Onset  . Diabetes Mother   . Hypertension Father   . Heart attack Father 54  . Lymphoma Son   . Cancer Son        lymphoblastic lymphoma    Prior to Admission medications   Medication Sig Start Date End Date Taking? Authorizing Provider  acetaminophen (TYLENOL) 325 MG tablet Take 650 mg by mouth every 6 (six) hours as needed for mild pain.    Yes [provider]  ALPRAZolam (XANAX) 0.25 MG tablet Take 1 tablet (0.25 mg total) by mouth 2 (two) times daily as needed for anxiety. 12/12/19  Yes Reed, Tiffany L, DO  busPIRone (BUSPAR) 30 MG tablet Take 30 mg by mouth 2 (two) times daily.    Yes [provider]  calcium carbonate (OSCAL) 1500 (600 Ca) MG TABS tablet Take 600 mg of elemental  calcium by mouth daily.   Yes [provider]  denosumab (PROLIA) 60 MG/ML SOSY injection Inject 60 mg into the skin every 6 (six) months.   Yes [provider]  diltiazem (TIAZAC) 360 MG 24 hr capsule Take 360 mg by mouth daily.   Yes [provider]  ferrous sulfate 325 (65 FE) MG tablet Take 325 mg by mouth daily with breakfast.   Yes [provider]  hydroxychloroquine (PLAQUENIL) 200 MG tablet Take 200 mg by mouth daily. 11/27/19  Yes [provider]  polyethylene glycol (MIRALAX / GLYCOLAX) 17 g packet Take 17 g by mouth daily as needed for mild constipation.    Yes [provider]  predniSONE (DELTASONE) 5 MG tablet Take 5 mg by mouth daily.   Yes [provider]  sertraline (ZOLOFT) 50 MG tablet Take 1 tablet (50 mg total) by mouth daily. 12/12/19  Yes Gayland Curry, DO    Physical Exam:  Constitutional: Thin frail elderly female who was sitting straight up in bed and appears to be in some distress Vitals:   12/31/19 0432 12/31/19 0433 12/31/19 0556 12/31/19 0600  BP: (!) 188/114 (!) 188/114 (!) 170/125 (!) 161/102  Pulse: (!) 106 (!) 103 (!) 167 (!) 123  Resp: (!) 26 14 11 11   Temp: 98.1 F (36.7 C)     TempSrc: Oral     SpO2: 100% 100% 100% 96%  Weight:      Height:       Eyes: PERRL, lids and conjunctivae normal ENMT: Mucous membranes are moist. Posterior pharynx clear of any exudate or lesions. .  Neck: normal, supple, no masses, no thyromegaly.  JVD appreciated. Respiratory: Decreased overall aeration with positive expiratory wheeze appreciated.  Patient currently on 2-1/2 L of nasal cannula oxygen with O2 saturations maintained. Cardiovascular: Tachycardic, no murmurs / rubs / gallops.  1+ bilateral lower extremity edema. 2+ pedal pulses. No carotid bruits.  Abdomen: no tenderness, no masses palpated. No hepatosplenomegaly. Bowel sounds positive.  Musculoskeletal: no clubbing / cyanosis. No joint deformity  upper and lower extremities. Good ROM, no contractures. Normal muscle tone.  Skin: no rashes, lesions, ulcers. No induration Neurologic: CN 2-12 grossly intact. Sensation intact, DTR normal. Strength 5/5 in all 4.  Psychiatric: Normal judgment and insight. Alert and oriented x 3.  Anxious mood.     Labs on Admission: I have personally reviewed following labs and imaging studies  CBC: Recent Labs  Lab 12/31/19 0432  WBC 15.6*  NEUTROABS 10.6*  HGB 12.2  HCT 35.7*  MCV 103.5*  PLT 99991111   Basic Metabolic Panel: Recent  Labs  Lab 12/31/19 0432  NA 132*  K 3.8  CL 100  CO2 20*  GLUCOSE 169*  BUN 19  CREATININE 1.08*  CALCIUM 8.6*   GFR: Estimated Creatinine Clearance: 29.7 mL/min (A) (by C-G formula based on SCr of 1.08 mg/dL (H)). Liver Function Tests: No results for input(s): AST, ALT, ALKPHOS, BILITOT, PROT, ALBUMIN in the last 168 hours. No results for input(s): LIPASE, AMYLASE in the last 168 hours. No results for input(s): AMMONIA in the last 168 hours. Coagulation Profile: No results for input(s): INR, PROTIME in the last 168 hours. Cardiac Enzymes: No results for input(s): CKTOTAL, CKMB, CKMBINDEX, TROPONINI in the last 168 hours. BNP (last 3 results) No results for input(s): PROBNP in the last 8760 hours. HbA1C: No results for input(s): HGBA1C in the last 72 hours. CBG: No results for input(s): GLUCAP in the last 168 hours. Lipid Profile: No results for input(s): CHOL, HDL, LDLCALC, TRIG, CHOLHDL, LDLDIRECT in the last 72 hours. Thyroid Function Tests: No results for input(s): TSH, T4TOTAL, FREET4, T3FREE, THYROIDAB in the last 72 hours. Anemia Panel: No results for input(s): VITAMINB12, FOLATE, FERRITIN, TIBC, IRON, RETICCTPCT in the last 72 hours. Urine analysis:    Component Value Date/Time   COLORURINE YELLOW 03/12/2019 1148   APPEARANCEUR CLEAR 03/12/2019 1148   LABSPEC 1.003 (L) 03/12/2019 1148   PHURINE 7.0 03/12/2019 1148   GLUCOSEU NEGATIVE  03/12/2019 1148   HGBUR NEGATIVE 03/12/2019 1148   BILIRUBINUR NEGATIVE 03/12/2019 1148   KETONESUR NEGATIVE 03/12/2019 1148   PROTEINUR 30 (A) 03/12/2019 1148   UROBILINOGEN 0.2 09/22/2012 1100   NITRITE POSITIVE (A) 03/12/2019 1148   LEUKOCYTESUR MODERATE (A) 03/12/2019 1148   Sepsis Labs: No results found for this or any previous visit (from the past 240 hour(s)).   Radiological Exams on Admission: DG Chest Port 1 View  Result Date: 12/31/2019 CLINICAL DATA:  Shortness of breath EXAM: PORTABLE CHEST 1 VIEW COMPARISON:  03/12/2019 FINDINGS: The lungs are hyperinflated with diffuse interstitial prominence. No pleural effusion or pneumothorax. Normal cardiomediastinal contours. Reticular opacities at both lung bases. IMPRESSION: COPD without focal airspace disease. Reticular opacities at both lung bases may indicate pulmonary edema or developing infection. Electronically Signed   By: Ulyses Jarred M.D.   On: 12/31/2019 05:26    EKG: Independently reviewed.  Sinus tachycardia 120 bpm with left bundle branch block. Faster rate than previous tracings.  Assessment/Plan  Respiratory failure with hypoxia  Pulmonary edema Diastolic congestive heart failure exacerbation: Acute.  Patient present with shortness of breath after initially found to be hypoxic placed on a nonrebreather by EMS.  Chest x-ray concerning more so for pulmonary edema with BNP elevated at 3381.8.  Last EF noted to be 55-60% with grade 1 diastolic dysfunction back in 09/2017.  Patient had been given 40 mg of Lasix IV. -Admit to a telemetry bed -Heart failure orders set  initiated  -Continuous pulse oximetry with nasal cannula oxygen as needed to keep O2 saturations >92% -Strict I&Os and daily weights -Add-on TSH -Lasix 40 mg IV Bid -Reassess in a.m. and adjust diuresis as needed. -Check echocardiogram -Follow-up telemetry overnight   Bronchitis: Acute on Chronic. Patient was noted to have decreased aeration with  expiratory wheezes appreciated.  She is not on oxygen at baseline and does not use any inhalers at home.  Last PFTs performed in 2016 noted signs of obstruction with concern for emphysema, but no clear signs of overinflation were appreciated at that time. -Levalbuterol 4 times daily  -  Solu-Medrol IV 60 mg every 8 hours  Hypertensive urgency: Acute.  Blood pressures initially noted to be elevated up to 199/129 with heart rates elevated into the 150-160s.  Patient home medications include diltiazem 360 mg daily.  Case discussed with cardiology over the phone and recommended starting patient on Coreg 6.25 mg twice daily and titrate to 12.5 mg twice daily.  Patient had received a dose of IV diltiazem 5 mg and her home dose without improvement in heart rate. -Start Coreg 6.25 mg twice daily and will increase to 12.5 mg twice daily if needed  Elevated troponin: Acute.  Initial troponin elevated at 72.  Patient complained of some substernal chest tightness.  Suspect secondary to acute demand in the setting of respiratory distress.  LBBB:  chronic.    Rheumatoid arthritis Raynaud's disease Chronic steroid use: Patient has history of seronegative rheumatoid arthritis and Raynaud's disease.  On injections of denosumab, Plaquenil, and prednisone 5 mg daily. -Continue Plaquenil  Anxiety and depression: Home medications include alprazolam 0.25 mg twice daily, buspirone 30 mg twice daily, and sertraline 100 mg daily which was recently increased from 50 mg daily a couple days ago. -Hold sertraline as unclear if symptoms are related with increased dose of sertraline -Continue patient's other home meds  Urinary retention: Patient reports having some difficulty being able to urinate.  Records note previous history of urinary retention requiring Foley catheter placement as well as prior history of cauda equina syndrome. -Bladder scan with and out cath if greater than 350 mL of urine present -Attempt 3 in and out  caths prior to placing Foley cath   Hyponatremia: Chronic.  Sodium 132.  Patient on SSRI and appears to normally have sodium levels in the 120s, but could also be related with heart failure. -Recheck sodium in a.m.  Tobacco abuse -Low-dose nicotine patch -Counseled on the need of cessation of tobacco use  DVT prophylaxis: lovenox  Code Status: Full Family Communication: Attempted to reach son over the phone without answer. Disposition Plan: To be determined Consults called: Cardiology Admission status: Inpatient require more than 2 midnight stay based off severity of symptoms and need for IV diuresis  Norval Morton MD [facility] Pager [phone]   If 7PM-7AM, please contact night-coverage [email or web address] Password [facility]  12/31/2019, 7:41 AM

## 2019-12-31 NOTE — Progress Notes (Signed)
Noticed around 2130, the patient's heart rate was up to 147 bpm.  Her blood pressure was 111/71.  Patient stated talking to her friend on the phone caused her anxiety level to rise, which raised her heart rate.  Paged the on call physician, who ordered Cardizem 10 mg IV one time.  Gave the IV Cardizem at 2209.  A few minutes later, the heart rate dropped down to 76 bpm.  Slowly the pt started to relax.  BP was 131/72.  Pt now resting in bed with call bell in reach.  Will continue to monitor.  Lupita Dawn, RN

## 2019-12-31 NOTE — ED Notes (Signed)
The pt keeps calling out reporting that  She is afraid and she wants to go home  She also keeps asking if she is going to be ok

## 2019-12-31 NOTE — ED Notes (Signed)
purewick placed on patient.  

## 2019-12-31 NOTE — ED Notes (Signed)
MD Tamala Julian notified of sustained HR > 150 and elevated B/P >150.

## 2019-12-31 NOTE — ED Notes (Signed)
Lunch Tray Ordered 1115. 

## 2019-12-31 NOTE — Progress Notes (Signed)
Pt admitted from ED.  Pt in active A-fib w/ HR of 97. BP 137/78 (97).  Pt showing ST segment elevation on telemetry, MD paged.  Pt is A&O X 4 and neuro intact.  Pt currently comfortable and will continue to be monitored.

## 2020-01-01 ENCOUNTER — Inpatient Hospital Stay (HOSPITAL_COMMUNITY): Payer: PPO

## 2020-01-01 DIAGNOSIS — I5031 Acute diastolic (congestive) heart failure: Secondary | ICD-10-CM

## 2020-01-01 DIAGNOSIS — F329 Major depressive disorder, single episode, unspecified: Secondary | ICD-10-CM

## 2020-01-01 DIAGNOSIS — R338 Other retention of urine: Secondary | ICD-10-CM

## 2020-01-01 DIAGNOSIS — J441 Chronic obstructive pulmonary disease with (acute) exacerbation: Secondary | ICD-10-CM

## 2020-01-01 DIAGNOSIS — M069 Rheumatoid arthritis, unspecified: Secondary | ICD-10-CM

## 2020-01-01 LAB — ECHOCARDIOGRAM COMPLETE
Height: 64 in
Weight: 1513.24 oz

## 2020-01-01 LAB — BASIC METABOLIC PANEL
Anion gap: 9 (ref 5–15)
BUN: 36 mg/dL — ABNORMAL HIGH (ref 8–23)
CO2: 22 mmol/L (ref 22–32)
Calcium: 8.2 mg/dL — ABNORMAL LOW (ref 8.9–10.3)
Chloride: 96 mmol/L — ABNORMAL LOW (ref 98–111)
Creatinine, Ser: 1.38 mg/dL — ABNORMAL HIGH (ref 0.44–1.00)
GFR calc Af Amer: 44 mL/min — ABNORMAL LOW (ref >60)
GFR calc non Af Amer: 38 mL/min — ABNORMAL LOW (ref >60)
Glucose, Bld: 136 mg/dL — ABNORMAL HIGH (ref 70–99)
Potassium: 4.9 mmol/L (ref 3.5–5.1)
Sodium: 127 mmol/L — ABNORMAL LOW (ref 135–145)

## 2020-01-01 MED ORDER — FUROSEMIDE 10 MG/ML IJ SOLN
40.0000 mg | Freq: Every day | INTRAMUSCULAR | Status: DC
  Administered 2020-01-02: 40 mg via INTRAVENOUS
  Filled 2020-01-01: qty 4

## 2020-01-01 MED ORDER — MELATONIN 3 MG PO TABS
3.0000 mg | ORAL_TABLET | Freq: Every day | ORAL | Status: DC
  Administered 2020-01-02 – 2020-01-05 (×5): 3 mg via ORAL
  Filled 2020-01-01 (×5): qty 1

## 2020-01-01 MED ORDER — SERTRALINE HCL 50 MG PO TABS
50.0000 mg | ORAL_TABLET | Freq: Every day | ORAL | Status: DC
  Administered 2020-01-01 – 2020-01-05 (×5): 50 mg via ORAL
  Filled 2020-01-01 (×5): qty 1

## 2020-01-01 MED ORDER — METOPROLOL TARTRATE 5 MG/5ML IV SOLN
2.5000 mg | INTRAVENOUS | Status: DC | PRN
  Administered 2020-01-02 (×2): 2.5 mg via INTRAVENOUS
  Filled 2020-01-01 (×2): qty 5

## 2020-01-01 NOTE — Progress Notes (Signed)
PROGRESS NOTE    Regina Ortiz  [id number] DOB: August 03, 1946 DOA: 12/31/2019 PCP: Regina Curry, DO   Brief Narrative:  HPI on 12/31/2019 by Dr. Fuller Plan Regina Ortiz is a 74 y.o. female with medical history significant of hypertension, COPD, chronic hyponatremia, LBBB, rheumatoid arthritis, Raynaud's disease, neurogenic bladder, cauda equina syndrome  S/P SURGICAL decompression in 01/28/2019 by Dr. Ellene Route, anemia, anxiety, depression, tobacco abuse, and GERD presents with complaints of shortness of breath.  She had been up all night as she was sweating profusely and having to change her shirt.  Thereafter, developed progressively worsening shortness of breath.  Associated symptoms included wheezing, very minimal cough, leg swelling, and later develops substernal chest pressure without radiation.  Patient continues to smoke  a couple cigarettes throughout the week, but would like to quit.  She feels that her anxiety has worsened despite her Zoloft dose being recently increased from 50 mg to 100 mg daily a couple days ago.  At baseline patient reports that she is not on oxygen.  En route with EMS patient was noted to be hypoxic and was initially placed on a nonrebreather.  She received 324 mg of aspirin and 2 nitroglycerin prior to arrival.  Assessment & Plan   Acute respiratory failure with hypoxia/pulmonary edema/acute diastolic congestive heart failure exacerbation -Patient presented with shortness of breath and was found to be hypoxic and placed on nonrebreather -Chest x-ray was concerning for pulmonary edema -Patient with elevated BNP of 3381.8 on admission -Echocardiogram January 2019 showed an EF of 50-55 to 60% with grade 1 diastolic dysfunction -Pending echocardiogram -Continue IV Lasix, but will decrease dose given AKI -Monitor intake and output, daily weights -Continue supplemental oxygen to maintain saturations above 92%  Acute on chronic bronchitis -Patient does  not use oxygen at baseline or any inhalers -PFTs from 2016 noted signs of obstruction with concern for emphysema -Continue nebulizer treatments as well as Solu-Medrol  Hypertensive urgency -BPs have been elevated -Patient was on Cardizem at home however cardiology recommended changing this to Coreg   Elevated troponin -Suspect this is demand ischemia to the above  Acute kidney injury -Possibly secondary to Lasix use -Will decrease the dose of Lasix and continue to monitor BMP  Rheumatoid arthritis/Raynaud's disease -Patient on chronic steroids -Continue Plaquenil -Placed on Solu-Medrol  Anxiety/depression -Continue BuSpar and Xanax as needed  Urinary retention -Patient with reports of difficulty urinating, and has a history of this -Continue in and out cath-if done more than 3 times, will place Foley catheter  Hyponatremia -Appears to be chronic in bed at baseline -Patient is on SSRIs -Continue to monitor BMP  Chronic LBBB  Tobacco abuse -Continue nicotine patch -Discussed smoking cessation  DVT Prophylaxis Lovenox  Code Status: Full  Family Communication: None at bedside  Disposition Plan:  Status is: Inpatient  Remains inpatient appropriate because:IV treatments appropriate due to intensity of illness or inability to take PO   Dispo: The patient is from: Home              Anticipated d/c is to: Home              Anticipated d/c date is: 2 days              Patient currently is not medically stable to d/c.  Consultants Cardiology via phone on admission  Procedures  None  Antibiotics   Anti-infectives (From admission, onward)   Start     Dose/Rate Route Frequency Ordered Stop  12/31/19 1000  hydroxychloroquine (PLAQUENIL) tablet 200 mg     200 mg Oral Daily 12/31/19 0806        Subjective:   Regina Ortiz seen and examined today.  Feels breathing is mildly improved but continues to need oxygen.  States her shortness of breath was getting  continuously worse at home.  Currently denies chest pain, abdominal pain, nausea or vomiting, diarrhea or constipation, dizziness or headache.    Objective:   Vitals:   12/31/19 2334 01/01/20 0358 01/01/20 0619 01/01/20 0855  BP: 125/79 (!) 141/84  140/73  Pulse: (!) 50 76  72  Resp: 15 16  20   Temp: 98.3 F (36.8 C) (!) 97.5 F (36.4 C)  97.8 F (36.6 C)  TempSrc: Oral Oral  Oral  SpO2: 100% 100%  96%  Weight:   42.9 kg   Height:        Intake/Output Summary (Last 24 hours) at 01/01/2020 1212 Last data filed at 01/01/2020 [id number] Gross per 24 hour  Intake 350 ml  Output 1315 ml  Net -965 ml   Filed Weights   12/31/19 0430 12/31/19 1727 01/01/20 0619  Weight: 41.2 kg 42.9 kg 42.9 kg    Exam  General: Well developed, thin, chronically ill-appearing, NAD  HEENT: NCAT, mucous membranes moist.   Cardiovascular: S1 S2 auscultated  Respiratory: Diminished, expiratory wheezing  Abdomen: Soft, nontender, nondistended, + bowel sounds  Extremities: warm dry without cyanosis clubbing.  Trace lower extremity edema  Neuro: AAOx3, nonfocal  Psych: Normal affect and demeanor with intact judgement and insight   Data Reviewed: I have personally reviewed following labs and imaging studies  CBC: Recent Labs  Lab 12/31/19 0432  WBC 15.6*  NEUTROABS 10.6*  HGB 12.2  HCT 35.7*  MCV 103.5*  PLT [id number]   Basic Metabolic Panel: Recent Labs  Lab 12/31/19 0432 12/31/19 0939 01/01/20 0357  NA 132*  --  127*  K 3.8  --  4.9  CL 100  --  96*  CO2 20*  --  22  GLUCOSE 169*  --  136*  BUN 19  --  36*  CREATININE 1.08*  --  1.38*  CALCIUM 8.6*  --  8.2*  MG  --  1.8  --    GFR: Estimated Creatinine Clearance: 24.2 mL/min (A) (by C-G formula based on SCr of 1.38 mg/dL (H)). Liver Function Tests: Recent Labs  Lab 12/31/19 0939  AST 28  ALT 44  ALKPHOS 44  BILITOT 0.7  PROT 6.4*  ALBUMIN 3.2*   No results for input(s): LIPASE, AMYLASE in the last 168 hours. No results  for input(s): AMMONIA in the last 168 hours. Coagulation Profile: No results for input(s): INR, PROTIME in the last 168 hours. Cardiac Enzymes: No results for input(s): CKTOTAL, CKMB, CKMBINDEX, TROPONINI in the last 168 hours. BNP (last 3 results) No results for input(s): PROBNP in the last 8760 hours. HbA1C: No results for input(s): HGBA1C in the last 72 hours. CBG: No results for input(s): GLUCAP in the last 168 hours. Lipid Profile: No results for input(s): CHOL, HDL, LDLCALC, TRIG, CHOLHDL, LDLDIRECT in the last 72 hours. Thyroid Function Tests: Recent Labs    12/31/19 0644  TSH 2.889   Anemia Panel: No results for input(s): VITAMINB12, FOLATE, FERRITIN, TIBC, IRON, RETICCTPCT in the last 72 hours. Urine analysis:    Component Value Date/Time   COLORURINE YELLOW 03/12/2019 1148   APPEARANCEUR CLEAR 03/12/2019 1148   LABSPEC 1.003 (L) 03/12/2019 1148  PHURINE 7.0 03/12/2019 1148   GLUCOSEU NEGATIVE 03/12/2019 1148   HGBUR NEGATIVE 03/12/2019 1148   [facility] 03/12/2019 1148   KETONESUR NEGATIVE 03/12/2019 1148   PROTEINUR 30 (A) 03/12/2019 1148   UROBILINOGEN 0.2 09/22/2012 1100   NITRITE POSITIVE (A) 03/12/2019 1148   LEUKOCYTESUR MODERATE (A) 03/12/2019 1148   Sepsis Labs: @LABRCNTIP (procalcitonin:4,lacticidven:4)  ) Recent Results (from the past 240 hour(s))  Respiratory Panel by RT PCR (Flu A&B, Covid) - Nasopharyngeal Swab     Status: None   Collection Time: 12/31/19  7:15 AM   Specimen: Nasopharyngeal Swab  Result Value Ref Range Status   SARS Coronavirus 2 by RT PCR NEGATIVE NEGATIVE Final    Comment: (NOTE) SARS-CoV-2 target nucleic acids are NOT DETECTED. The SARS-CoV-2 RNA is generally detectable in upper respiratoy specimens during the acute phase of infection. The lowest concentration of SARS-CoV-2 viral copies this assay can detect is 131 copies/mL. A negative result does not preclude SARS-Cov-2 infection and should not be used as the  sole basis for treatment or other patient management decisions. A negative result may occur with  improper specimen collection/handling, submission of specimen other than nasopharyngeal swab, presence of viral mutation(s) within the areas targeted by this assay, and inadequate number of viral copies (<131 copies/mL). A negative result must be combined with clinical observations, patient history, and epidemiological information. The expected result is Negative. Fact Sheet for Patients:  PinkCheek.be Fact Sheet for Healthcare Providers:  GravelBags.it This test is not yet ap proved or cleared by the [place] FDA and  has been authorized for detection and/or diagnosis of SARS-CoV-2 by FDA under an Emergency Use Authorization (EUA). This EUA will remain  in effect (meaning this test can be used) for the duration of the COVID-19 declaration under Section 564(b)(1) of the Act, 21 U.S.C. section 360bbb-3(b)(1), unless the authorization is terminated or revoked sooner.    Influenza A by PCR NEGATIVE NEGATIVE Final   Influenza B by PCR NEGATIVE NEGATIVE Final    Comment: (NOTE) The Xpert Xpress SARS-CoV-2/FLU/RSV assay is intended as an aid in  the diagnosis of influenza from Nasopharyngeal swab specimens and  should not be used as a sole basis for treatment. Nasal washings and  aspirates are unacceptable for Xpert Xpress SARS-CoV-2/FLU/RSV  testing. Fact Sheet for Patients: PinkCheek.be Fact Sheet for Healthcare Providers: GravelBags.it This test is not yet approved or cleared by the [place] FDA and  has been authorized for detection and/or diagnosis of SARS-CoV-2 by  FDA under an Emergency Use Authorization (EUA). This EUA will remain  in effect (meaning this test can be used) for the duration of the  Covid-19 declaration under Section 564(b)(1) of the Act, 21    U.S.C. section 360bbb-3(b)(1), unless the authorization is  terminated or revoked. Performed at [facility], [place] [address] [place], [place] [address]       Radiology Studies: DG Chest Port 1 View  Result Date: 12/31/2019 CLINICAL DATA:  Shortness of breath EXAM: PORTABLE CHEST 1 VIEW COMPARISON:  03/12/2019 FINDINGS: The lungs are hyperinflated with diffuse interstitial prominence. No pleural effusion or pneumothorax. Normal cardiomediastinal contours. Reticular opacities at both lung bases. IMPRESSION: COPD without focal airspace disease. Reticular opacities at both lung bases may indicate pulmonary edema or developing infection. Electronically Signed   By: Ulyses Jarred M.D.   On: 12/31/2019 05:26     Scheduled Meds: . busPIRone  30 mg Oral BID  . calcium carbonate  500 mg of elemental  calcium Oral Q breakfast  . enoxaparin (LOVENOX) injection  30 mg Subcutaneous Q24H  . furosemide  40 mg Intravenous BID  . hydroxychloroquine  200 mg Oral Daily  . methylPREDNISolone (SOLU-MEDROL) injection  60 mg Intravenous Q8H  . nicotine  7 mg Transdermal Daily  . sodium chloride flush  3 mL Intravenous Q12H   Continuous Infusions: . sodium chloride       LOS: 1 day   Time Spent in minutes   45 minutes  Mikle Sternberg D.O. on 01/01/2020 at 12:12 PM  Between 7am to 7pm - Please see pager noted on [email or web address]  After 7pm go to [email or web address]  And look for the night coverage person covering for me after hours  [facility] Office  [phone]

## 2020-01-01 NOTE — Progress Notes (Signed)
  Echocardiogram 2D Echocardiogram has been performed.  Regina Ortiz 01/01/2020, 3:34 PM

## 2020-01-01 NOTE — Evaluation (Signed)
Physical Therapy Evaluation Patient Details Name: Regina Ortiz MRN: [id number] DOB: 08-05-1946 Today's Date: 01/01/2020   History of Present Illness  74 yo admitted with acute respiratory failure with tachypnea, Afib and acute CHF exacerbation. PMhx: HTN, COPD, LBBB, hyponatremia, RA, Raynaud's, cauda equina syndrome with neurogenic bladder, anxiety/depression  Clinical Impression  Pt pleasant supine on arrival who reports she feels her tachycardia and tachypnea on admission were due to anxiety from her neighbor. Pt report neighbor has been all in her business and recommending and changing doctors for her and that it gives her anxiety. Today pt with maintained sats 94-98% on RA and Hr 77-84. Pt with decreased activity tolerance, functional mobility and gait who will benefit from acute therapy to maximize mobility, safety and function. Pt has been sponge bathing downstairs because she is fearful of going upstairs and afraid of walking outside with one of her aides. Pt would benefit from RW reinforcement for safety and stair practice.      Follow Up Recommendations Home health PT    Equipment Recommendations  None recommended by PT    Recommendations for Other Services       Precautions / Restrictions Precautions Precautions: Fall      Mobility  Bed Mobility Overal bed mobility: Modified Independent                Transfers Overall transfer level: Modified independent                  Ambulation/Gait Ambulation/Gait assistance: Supervision Gait Distance (Feet): 200 Feet Assistive device: Rolling walker (2 wheeled) Gait Pattern/deviations: Step-through pattern;Decreased stride length   Gait velocity interpretation: >2.62 ft/sec, indicative of community ambulatory General Gait Details: cues for posture and proximity to RW with SpO2 >94% on RA throughout  Stairs            Wheelchair Mobility    Modified Rankin (Stroke Patients Only)        Balance Overall balance assessment: Needs assistance   Sitting balance-Leahy Scale: Good       Standing balance-Leahy Scale: Fair Standing balance comment: pt can stand without assist, bil UE support for gait                             Pertinent Vitals/Pain Pain Assessment: No/denies pain    Home Living Family/patient expects to be discharged to:: Private residence Living Arrangements: Alone Available Help at Discharge: Personal care attendant;Available PRN/intermittently Type of Home: House Home Access: Stairs to enter Entrance Stairs-Rails: Chemical engineer of Steps: 3 Home Layout: Two level;Able to live on main level with bedroom/bathroom;1/2 bath on main level Home Equipment: Walker - 2 wheels;Bedside commode Additional Comments: caregiver roughly 8 hrs a day for homemaking and , supervision for sponge bath pt doesn't go upstairs to shower    Prior Function Level of Independence: Needs assistance   Gait / Transfers Assistance Needed: ambulates with RW limited household levels   ADL's / Homemaking Assistance Needed: aide helps with dressing and bathing, sponge bathing as tub upstairs        Hand Dominance        Extremity/Trunk Assessment   Upper Extremity Assessment Upper Extremity Assessment: Overall WFL for tasks assessed    Lower Extremity Assessment Lower Extremity Assessment: Overall WFL for tasks assessed    Cervical / Trunk Assessment Cervical / Trunk Assessment: Normal  Communication   Communication: No difficulties  Cognition Arousal/Alertness: Awake/alert Behavior During  Therapy: WFL for tasks assessed/performed Overall Cognitive Status: Within Functional Limits for tasks assessed                                        General Comments      Exercises     Assessment/Plan    PT Assessment Patient needs continued PT services  PT Problem List Decreased strength;Decreased mobility;Decreased  safety awareness;Decreased activity tolerance;Decreased balance;Decreased knowledge of use of DME       PT Treatment Interventions DME instruction;Therapeutic exercise;Gait training;Balance training;Functional mobility training;Therapeutic activities;Stair training;Patient/family education    PT Goals (Current goals can be found in the Care Plan section)  Acute Rehab PT Goals Patient Stated Goal: return home and have my neighbor stay out of it PT Goal Formulation: With patient Time For Goal Achievement: 01/15/20 Potential to Achieve Goals: Good    Frequency Min 3X/week   Barriers to discharge Decreased caregiver support      Co-evaluation               AM-PAC PT "6 Clicks" Mobility  Outcome Measure Help needed turning from your back to your side while in a flat bed without using bedrails?: None Help needed moving from lying on your back to sitting on the side of a flat bed without using bedrails?: None Help needed moving to and from a bed to a chair (including a wheelchair)?: None Help needed standing up from a chair using your arms (e.g., wheelchair or bedside chair)?: None Help needed to walk in hospital room?: A Little Help needed climbing 3-5 steps with a railing? : A Little 6 Click Score: 22    End of Session   Activity Tolerance: Patient tolerated treatment well Patient left: in chair;with call bell/phone within reach Nurse Communication: Mobility status PT Visit Diagnosis: Other abnormalities of gait and mobility (R26.89);Difficulty in walking, not elsewhere classified (R26.2);Muscle weakness (generalized) (M62.81)    Time: [id number] PT Time Calculation (min) (ACUTE ONLY): 20 min   Charges:   PT Evaluation $PT Eval Moderate Complexity: 1 Mod          Avalyn Molino P, PT Acute Rehabilitation Services Pager: [phone] Office: [phone]   Sandy Salaam Vianca Bracher 01/01/2020, 12:58 PM

## 2020-01-02 DIAGNOSIS — R778 Other specified abnormalities of plasma proteins: Secondary | ICD-10-CM | POA: Diagnosis not present

## 2020-01-02 DIAGNOSIS — F419 Anxiety disorder, unspecified: Secondary | ICD-10-CM | POA: Diagnosis not present

## 2020-01-02 DIAGNOSIS — J42 Unspecified chronic bronchitis: Secondary | ICD-10-CM | POA: Diagnosis not present

## 2020-01-02 DIAGNOSIS — Z720 Tobacco use: Secondary | ICD-10-CM

## 2020-01-02 DIAGNOSIS — I5021 Acute systolic (congestive) heart failure: Secondary | ICD-10-CM | POA: Diagnosis not present

## 2020-01-02 LAB — BASIC METABOLIC PANEL
Anion gap: 9 (ref 5–15)
BUN: 46 mg/dL — ABNORMAL HIGH (ref 8–23)
CO2: 23 mmol/L (ref 22–32)
Calcium: 8.5 mg/dL — ABNORMAL LOW (ref 8.9–10.3)
Chloride: 96 mmol/L — ABNORMAL LOW (ref 98–111)
Creatinine, Ser: 1.49 mg/dL — ABNORMAL HIGH (ref 0.44–1.00)
GFR calc Af Amer: 40 mL/min — ABNORMAL LOW (ref >60)
GFR calc non Af Amer: 34 mL/min — ABNORMAL LOW (ref >60)
Glucose, Bld: 160 mg/dL — ABNORMAL HIGH (ref 70–99)
Potassium: 4.4 mmol/L (ref 3.5–5.1)
Sodium: 128 mmol/L — ABNORMAL LOW (ref 135–145)

## 2020-01-02 MED ORDER — ENOXAPARIN SODIUM 40 MG/0.4ML ~~LOC~~ SOLN
40.0000 mg | SUBCUTANEOUS | Status: DC
  Administered 2020-01-02: 40 mg via SUBCUTANEOUS
  Filled 2020-01-02 (×2): qty 0.4

## 2020-01-02 MED ORDER — SODIUM CHLORIDE 0.9 % IV SOLN: INTRAVENOUS | Status: DC

## 2020-01-02 MED ORDER — SODIUM CHLORIDE 0.9 % IV SOLN: 250.0000 mL | INTRAVENOUS | Status: DC | PRN

## 2020-01-02 MED ORDER — SODIUM CHLORIDE 0.9% FLUSH: 3.0000 mL | INTRAVENOUS | Status: DC | PRN

## 2020-01-02 MED ORDER — ASPIRIN 81 MG PO CHEW
81.0000 mg | CHEWABLE_TABLET | ORAL | Status: AC
  Administered 2020-01-03: 81 mg via ORAL
  Filled 2020-01-02: qty 1

## 2020-01-02 MED ORDER — METHYLPREDNISOLONE SODIUM SUCC 40 MG IJ SOLR
40.0000 mg | Freq: Two times a day (BID) | INTRAMUSCULAR | Status: DC
  Administered 2020-01-02 – 2020-01-03 (×3): 40 mg via INTRAVENOUS
  Filled 2020-01-02 (×4): qty 1

## 2020-01-02 MED ORDER — ADULT MULTIVITAMIN W/MINERALS CH
1.0000 | ORAL_TABLET | Freq: Every day | ORAL | Status: DC
  Administered 2020-01-02 – 2020-01-06 (×5): 1 via ORAL
  Filled 2020-01-02 (×6): qty 1

## 2020-01-02 MED ORDER — SODIUM CHLORIDE 0.9% FLUSH
3.0000 mL | Freq: Two times a day (BID) | INTRAVENOUS | Status: DC
  Administered 2020-01-04 – 2020-01-06 (×3): 3 mL via INTRAVENOUS

## 2020-01-02 MED ORDER — ALPRAZOLAM 0.5 MG PO TABS
0.5000 mg | ORAL_TABLET | Freq: Two times a day (BID) | ORAL | Status: DC | PRN
  Administered 2020-01-02 – 2020-01-04 (×5): 0.5 mg via ORAL
  Filled 2020-01-02 (×5): qty 1

## 2020-01-02 MED ORDER — HYDRALAZINE HCL 20 MG/ML IJ SOLN
5.0000 mg | Freq: Once | INTRAMUSCULAR | Status: AC
  Administered 2020-01-02: 5 mg via INTRAVENOUS
  Filled 2020-01-02: qty 1

## 2020-01-02 MED ORDER — HYDRALAZINE HCL 25 MG PO TABS
25.0000 mg | ORAL_TABLET | Freq: Three times a day (TID) | ORAL | Status: DC
  Administered 2020-01-02 – 2020-01-06 (×13): 25 mg via ORAL
  Filled 2020-01-02 (×14): qty 1

## 2020-01-02 MED ORDER — ENSURE ENLIVE PO LIQD: 237.0000 mL | Freq: Two times a day (BID) | ORAL | Status: DC

## 2020-01-02 NOTE — H&P (View-Only) (Signed)
Cardiology Consultation:   Patient ID: Regina Ortiz MRN: [id number]; DOB: 07-10-1946  Admit date: 12/31/2019 Date of Consult: 01/02/2020  Primary Care Provider: Gayland Curry, DO Primary Cardiologist: Candee Furbish, MD   Patient Profile:   Regina Ortiz is a 74 y.o. female with a hx of COPD, chronic left bundle branch block, anxiety/PTSD, rheumatoid arthritis, Raynaud's disease, cauda equina s/p surgical decompression 12/2018, tobacco abuse, urinary retension and HTN who is being seen today for the evaluation of reduced EF at the request of Dr. Ree Kida.   Low risk stress test January 2019.  Echocardiogram in 2019 showed normal LV function with grade 1 diastolic dysfunction.  Last seen by Dr. Marlou Porch virtually 12/2018.  Ongoing chest discomfort with multiple risk factor.  Recommended coronary CT with FFR but never completed.  History of Present Illness:   [name] presented with sudden onset shortness of breath.  Patient has longstanding history of intermittent lower extremity edema.  BNP 3381 CXR with pulmonary edema vs infection  BUN/SCr increases since admit 19/1.08>>36/1.38>>46/1.49 Hs-troponin 72>>113 TSH normal COVID and influenza panel negative  Patient was admitted for acute respiratory failure with hypoxia secondary to pulmonary edema and acute heart failure.  Also treated with nebulizer and Solu-Medrol for acute on chronic bronchitis.  BP running high>> discontinued Coreg and Cardizem  Getting IV lasix 40mg  BID  >> diuresed 1.4L. however weight is up 1 lb 94>>95 lb.   Cardiology asked for evaluation of low EF. Echo with Moderate to severe global reduction in LV systolic function; grade 1  diastolic dysfunction; moderate LVH; moderate MR; severe LAE.   Patient reports sudden onset dyspnea leading to ER evaluation.  Breathing improved since here.  She smokes 1 to 2 cigarettes/day.  She used to smoke less than half a pack a day for 50 years, quit last year but  relapsed this year about 2 months ago.  Lives by herself and Aids for 8 hours during daytime.  She was hypertensive and tachycardic during initial presentation.  Review of telemetry shows new onset intermittent atrial fibrillation/atrial flutter.  Currently in sinus rhythm.  Past Medical History:  Diagnosis Date  . AIN grade I   . Anemia   . Anxiety   . Bruises easily   . Chronic diarrhea   . Chronic hyponatremia   . Colitis   . COPD (chronic obstructive pulmonary disease) ([place])   . Depression   . GERD (gastroesophageal reflux disease)   . History of chronic bronchitis   . History of vulvar dysplasia    serveral recurrency's   . Hypertension   . Insomnia   . LBBB (left bundle branch block) W/ PROLONGED PR   CARDIOLOGSIT-  DR Johnsie Cancel-   LOV IN EPIC  . Macrocytosis   . Major depressive disorder   . Nocturia   . Osteoporosis   . Raynaud's disease   . Sciatica    right side  . Seronegative rheumatoid arthritis ([place])   . Short of breath on exertion   . Smokers' cough ([place])   . Venous insufficiency of leg    , Edema  . VIN III (vulvar intraepithelial neoplasia III)    RECURRENT -  . Vitamin B12 deficiency     Past Surgical History:  Procedure Laterality Date  . APPENDECTOMY  1992  . BIOPSY  08/19/2019   Procedure: BIOPSY;  Surgeon: [name], MD;  Location: [facility];  Service: Gastroenterology;;  . CARDIOVASCULAR STRESS TEST  06-23-2012  DR Jenkins [place]  NORMAL LEXISCAN MYOVIEW/ EF 83%/  NO ISCHEMIA  . CO2 LASER APPLICATION  [id number]   Procedure: CO2 LASER APPLICATION;  Surgeon: Janie Morning, MD PHD;  Location: [facility];  Service: Gynecology;  Laterality: N/A;  Laser Vaporization  . CO2 LASER APPLICATION N/A [id number]   Procedure: CO2 LASER APPLICATION;  Surgeon: [name], MD;  Location: [facility];  Service: Gynecology;  Laterality: N/A;  . CO2 LASER APPLICATION N/A [id number]   Procedure: CO2 LASER APPLICATION of the vulva;   Surgeon: Janie Morning, MD;  Location: [facility];  Service: Gynecology;  Laterality: N/A;  . COLONOSCOPY WITH PROPOFOL N/A 08/19/2019   Procedure: COLONOSCOPY WITH PROPOFOL;  Surgeon: [name], MD;  Location: [place];  Service: Gastroenterology;  Laterality: N/A;  . ESOPHAGOGASTRODUODENOSCOPY (EGD) WITH PROPOFOL N/A 08/19/2019   Procedure: ESOPHAGOGASTRODUODENOSCOPY (EGD) WITH PROPOFOL;  Surgeon: [name], MD;  Location: [place];  Service: Gastroenterology;  Laterality: N/A;  . EXCISION VULVAR LESIONS  07/2012  . HEMORRHOID SURGERY N/A 04/15/2013   Procedure: Excision of external and internal  hemorrhoid with AIN, Exam under anesthesia;  Surgeon: Odis Hollingshead, MD;  Location: [facility];  Service: General;  Laterality: N/A;  . HIP PINNING,CANNULATED Right 03/15/2019   Procedure: Maebelle Munroe OF FEMORAL NECK FRACTURE;  Surgeon: Renette Butters, MD;  Location: [place];  Service: Orthopedics;  Laterality: Right;  . POLYPECTOMY  08/19/2019   Procedure: POLYPECTOMY;  Surgeon: [name], MD;  Location: [facility];  Service: Gastroenterology;;  . REPAIR FISTULA IN ANO/  I & D PERIRECTAL ABSCESS  10-26-2002  DR Johnathan Hausen  . TRANSTHORACIC ECHOCARDIOGRAM  06-23-2012   NORMAL LVSF/ EF 55-60%  . VAGINAL HYSTERECTOMY  1997   partial  . VULVAR LESION REMOVAL N/A 11/03/2014   Procedure: CO2 LASER OF VULVAR ;  Surgeon: [name], MD;  Location: [facility];  Service: Gynecology;  Laterality: N/A;  . VULVECTOMY N/A 03/26/2015   Procedure: WIDE LOCA  EXCISION OF VULVAR;  Surgeon: [name], MD;  Location: [place];  Service: Gynecology;  Laterality: N/A;  . Wide Local Excision of labia majora  04-04-2010   Right-sided lesion, CO2 ablation of right labia minora     Inpatient Medications: Scheduled Meds: . busPIRone  30 mg Oral BID  . calcium carbonate  500 mg of elemental calcium Oral Q breakfast  . enoxaparin (LOVENOX) injection  30 mg  Subcutaneous Q24H  . furosemide  40 mg Intravenous Daily  . hydroxychloroquine  200 mg Oral Daily  . melatonin  3 mg Oral QHS  . methylPREDNISolone (SOLU-MEDROL) injection  40 mg Intravenous Q12H  . nicotine  7 mg Transdermal Daily  . sertraline  50 mg Oral Daily  . sodium chloride flush  3 mL Intravenous Q12H   Continuous Infusions: . sodium chloride     PRN Meds: sodium chloride, acetaminophen, ALPRAZolam, levalbuterol, metoprolol tartrate, sodium chloride flush  Allergies:    Allergies  Allergen Reactions  . Latex Swelling and Other (See Comments)     fever blisters  . Augmentin [Amoxicillin-Pot Clavulanate] Nausea And Vomiting  . Doxycycline Other (See Comments)    Blurred vision, nervous, unsteady  . Hydrocodone Itching    Acetaminophen:itching, confusion, hallucinations-side effects-Onset date 07/15/2018  . Lactose Intolerance (Gi) Other (See Comments)    Unknown, per pt  . Lialda [Mesalamine] Diarrhea  . Prinivil [Lisinopril] Diarrhea  . Triamcinolone Acetonide Other (See Comments)    REDNESS AND PAIN  . Amlodipine  Rash  . Lotensin [Benazepril Hcl] Rash    Social History:   Social History   Socioeconomic History  . Marital status: Widowed    Spouse name: Not on file  . Number of children: Not on file  . Years of education: Not on file  . Highest education level: Not on file  Occupational History  . Not on file  Tobacco Use  . Smoking status: Current Every Day Smoker    Types: Cigarettes  . Smokeless tobacco: Never Used  . Tobacco comment: 2 cigarettes a day  Substance and Sexual Activity  . Alcohol use: Not Currently    Alcohol/week: 2.0 standard drinks    Types: 2 Cans of beer per week  . Drug use: No  . Sexual activity: Yes  Other Topics Concern  . Not on file  Social History Narrative   Social History      Diet? reg      Do you drink/eat things with caffeine? yes      Marital status?                  Widowed                   What year were  you married? 1993      Do you live in a house, apartment, assisted living, condo, trailer, etc.? House condo      Is it one or more stories? 2 stories       How many persons live in your home? 1      Do you have any pets in your home? (please list) 0      Highest level of education completed?1 year college       Current or past profession: telemarketer      Do you exercise?                           yes           Type & how often?  Walk 2 - 3  Outside/inside  everyday       Advanced Directives      Do you have a living will? yes      Do you have a DNR form?      yes                            If not, do you want to discuss one?       Do you have signed POA/HPOA for forms? No       Functional Status      Do you have difficulty bathing or dressing yourself? yes      Do you have difficulty preparing food or eating? yes      Do you have difficulty managing your medications? yes      Do you have difficulty managing your finances? yes      Do you have difficulty affording your medications? No       Social Determinants of Health   Financial Resource Strain:   . Difficulty of Paying Living Expenses:   Food Insecurity:   . Worried About Charity fundraiser in the Last Year:   . Arboriculturist in the Last Year:   Transportation Needs:   . Film/video editor (Medical):   Marland Kitchen Lack of Transportation (Non-Medical):   Physical Activity:   . Days  of Exercise per Week:   . Minutes of Exercise per Session:   Stress:   . Feeling of Stress :   Social Connections:   . Frequency of Communication with Friends and Family:   . Frequency of Social Gatherings with Friends and Family:   . Attends Religious Services:   . Active Member of Clubs or Organizations:   . Attends Archivist Meetings:   Marland Kitchen Marital Status:   Intimate Partner Violence:   . Fear of Current or Ex-Partner:   . Emotionally Abused:   Marland Kitchen Physically Abused:   . Sexually Abused:     Family History:     Family History  Problem Relation Age of Onset  . Diabetes Mother   . Hypertension Father   . Heart attack Father 12  . Lymphoma Son   . Cancer Son        lymphoblastic lymphoma     ROS:  Please see the history of present illness.  All other ROS reviewed and negative.     Physical Exam/Data:   Vitals:   01/02/20 0603 01/02/20 0653 01/02/20 0730 01/02/20 1319  BP: (!) 170/98 (!) 153/87 (!) 152/86 (!) 178/95  Pulse:   82 96  Resp:  (!) 22 14 (!) 24  Temp:   98.2 F (36.8 C) (!) 97.4 F (36.3 C)  TempSrc:   Oral Oral  SpO2:   98% 100%  Weight:      Height:        Intake/Output Summary (Last 24 hours) at 01/02/2020 1324 Last data filed at 01/02/2020 1206 Gross per 24 hour  Intake 1110 ml  Output 2450 ml  Net -1340 ml   Last 3 Weights 01/02/2020 01/01/2020 12/31/2019  Weight (lbs) 95 lb 8 oz 94 lb 9.2 oz 94 lb 9.6 oz  Weight (kg) 43.319 kg 42.9 kg 42.91 kg     Body mass index is 16.39 kg/m.  General: Frail elderly ill-appearing female in no acute distress  HEENT: normal Lymph: no adenopathy Neck: no JVD Endocrine:  No thryomegaly Vascular: No carotid bruits; FA pulses 2+ bilaterally without bruits  Cardiac:  normal S1, S2; RRR; systolic murmur at the apex Lungs:  clear to auscultation bilaterally, no wheezing, rhonchi or rales  Abd: soft, nontender, no hepatomegaly  Ext: no edema Musculoskeletal:  No deformities, BUE and BLE strength normal and equal Skin: Scattered ecchymosis on extremity upper greater than lower Neuro:  CNs 2-12 intact, no focal abnormalities noted Psych:  Normal affect   EKG:  The EKG was personally reviewed and demonstrates:  Sinus tachycardia at 120 bpm, LVH, LBBB Telemetry:  Telemetry was personally reviewed and demonstrates: Intermittent atrial fibrillation/flutter with rapid ventricular rate  Relevant CV Studies:  Echo 01/01/20 1. Moderate to severe global reduction in LV systolic function; grade 1  diastolic dysfunction; moderate LVH;  moderate MR; severe LAE.  2. Left ventricular ejection fraction, by estimation, is 30 to 35%. The  left ventricle has moderately decreased function. The left ventricle  demonstrates global hypokinesis. There is moderate left ventricular  hypertrophy. Left ventricular diastolic  parameters are consistent with Grade I diastolic dysfunction (impaired  relaxation).  3. Right ventricular systolic function is normal. The right ventricular  size is normal.  4. Left atrial size was severely dilated.  5. The mitral valve is normal in structure. Moderate mitral valve  regurgitation. No evidence of mitral stenosis.  6. The aortic valve is tricuspid. Aortic valve regurgitation is not  visualized. Mild aortic valve  sclerosis is present, with no evidence of  aortic valve stenosis.  7. The inferior vena cava is normal in size with greater than 50%  respiratory variability, suggesting right atrial pressure of 3 mmHg.   Laboratory Data:  High Sensitivity Troponin:   Recent Labs  Lab 12/31/19 0432 12/31/19 0644  TROPONINIHS 72* 113*     Chemistry Recent Labs  Lab 12/31/19 0432 01/01/20 0357 01/02/20 0317  NA 132* 127* 128*  K 3.8 4.9 4.4  CL 100 96* 96*  CO2 20* 22 23  GLUCOSE 169* 136* 160*  BUN 19 36* 46*  CREATININE 1.08* 1.38* 1.49*  CALCIUM 8.6* 8.2* 8.5*  GFRNONAA 51* 38* 34*  GFRAA 59* 44* 40*  ANIONGAP 12 9 9     Recent Labs  Lab 12/31/19 0939  PROT 6.4*  ALBUMIN 3.2*  AST 28  ALT 44  ALKPHOS 44  BILITOT 0.7   Hematology Recent Labs  Lab 12/31/19 0432  WBC 15.6*  RBC 3.45*  HGB 12.2  HCT 35.7*  MCV 103.5*  MCH 35.4*  MCHC 34.2  RDW 14.3  PLT 267   BNP Recent Labs  Lab 12/31/19 0432  BNP 3,381.8*    Radiology/Studies:  DG Chest Port 1 View  Result Date: 12/31/2019 CLINICAL DATA:  Shortness of breath EXAM: PORTABLE CHEST 1 VIEW COMPARISON:  03/12/2019 FINDINGS: The lungs are hyperinflated with diffuse interstitial prominence. No pleural effusion  or pneumothorax. Normal cardiomediastinal contours. Reticular opacities at both lung bases. IMPRESSION: COPD without focal airspace disease. Reticular opacities at both lung bases may indicate pulmonary edema or developing infection. Electronically Signed   By: Ulyses Jarred M.D.   On: 12/31/2019 05:26   ECHOCARDIOGRAM COMPLETE  Result Date: 01/01/2020    ECHOCARDIOGRAM REPORT   Patient Name:   Anetta DRAPER Vasques Date of Exam: 01/01/2020 Medical Rec #:  [id number]          Height:       64.0 in Accession #:    [id number]         Weight:       94.6 lb Date of Birth:  [birth year]          BSA:          1.423 m Patient Age:    81 years           BP:           140/73 mmHg Patient Gender: F                  HR:           72 bpm. Exam Location:  Inpatient Procedure: 2D Echo, Cardiac Doppler and Color Doppler Indications:    CHF-Acute Diastolic  History:        Patient has prior history of Echocardiogram examinations, most                 recent 09/24/2017. COPD, Arrythmias:LBBB,                 Signs/Symptoms:Shortness of Breath; Risk Factors:Current Smoker,                 Hypertension and Dyslipidemia.  Sonographer:    Clayton Lefort RDCS (AE) Referring Phys: [id number] RONDELL A SMITH IMPRESSIONS  1. Moderate to severe global reduction in LV systolic function; grade 1 diastolic dysfunction; moderate LVH; moderate MR; severe LAE.  2. Left ventricular ejection fraction, by estimation, is 30 to 35%. The left ventricle has moderately decreased function.  The left ventricle demonstrates global hypokinesis. There is moderate left ventricular hypertrophy. Left ventricular diastolic parameters are consistent with Grade I diastolic dysfunction (impaired relaxation).  3. Right ventricular systolic function is normal. The right ventricular size is normal.  4. Left atrial size was severely dilated.  5. The mitral valve is normal in structure. Moderate mitral valve regurgitation. No evidence of mitral stenosis.  6. The aortic valve is  tricuspid. Aortic valve regurgitation is not visualized. Mild aortic valve sclerosis is present, with no evidence of aortic valve stenosis.  7. The inferior vena cava is normal in size with greater than 50% respiratory variability, suggesting right atrial pressure of 3 mmHg. FINDINGS  Left Ventricle: Left ventricular ejection fraction, by estimation, is 30 to 35%. The left ventricle has moderately decreased function. The left ventricle demonstrates global hypokinesis. The left ventricular internal cavity size was normal in size. There is moderate left ventricular hypertrophy. Left ventricular diastolic parameters are consistent with Grade I diastolic dysfunction (impaired relaxation). Right Ventricle: The right ventricular size is normal. Right ventricular systolic function is normal. There is normal pulmonary artery systolic pressure. The tricuspid regurgitant velocity is 2.57 m/s, and with an assumed right atrial pressure of 3 mmHg,  the estimated right ventricular systolic pressure is [id number] mmHg. Left Atrium: Left atrial size was severely dilated. Right Atrium: Right atrial size was normal in size. Pericardium: There is no evidence of pericardial effusion. Mitral Valve: The mitral valve is normal in structure. Normal mobility of the mitral valve leaflets. Moderate mitral valve regurgitation. No evidence of mitral valve stenosis. MV peak gradient, 5.8 mmHg. The mean mitral valve gradient is 2.0 mmHg. Tricuspid Valve: The tricuspid valve is normal in structure. Tricuspid valve regurgitation is mild . No evidence of tricuspid stenosis. Aortic Valve: The aortic valve is tricuspid. Aortic valve regurgitation is not visualized. Mild aortic valve sclerosis is present, with no evidence of aortic valve stenosis. Aortic valve mean gradient measures 3.0 mmHg. Aortic valve peak gradient measures 4.3 mmHg. Aortic valve area, by VTI measures 2.13 cm. Pulmonic Valve: The pulmonic valve was not well visualized. Pulmonic valve  regurgitation is mild. No evidence of pulmonic stenosis. Aorta: The aortic root is normal in size and structure. Venous: The inferior vena cava is normal in size with greater than 50% respiratory variability, suggesting right atrial pressure of 3 mmHg. IAS/Shunts: No atrial level shunt detected by color flow Doppler. Additional Comments: Moderate to severe global reduction in LV systolic function; grade 1 diastolic dysfunction; moderate LVH; moderate MR; severe LAE.  LEFT VENTRICLE PLAX 2D LVIDd:         4.70 cm LVIDs:         3.80 cm LV PW:         1.40 cm LV IVS:        1.30 cm LVOT diam:     1.70 cm LV SV:         50 LV SV Index:   35 LVOT Area:     2.27 cm  LV Volumes (MOD) LV vol d, MOD A2C: 84.5 ml LV vol d, MOD A4C: 99.0 ml LV vol s, MOD A2C: 57.8 ml LV vol s, MOD A4C: 66.8 ml LV SV MOD A2C:     26.7 ml LV SV MOD A4C:     99.0 ml LV SV MOD BP:      29.8 ml RIGHT VENTRICLE            IVC RV Basal diam:  2.50 cm  IVC diam: 2.00 cm RV S prime:     9.36 cm/s TAPSE (M-mode): 2.2 cm LEFT ATRIUM             Index       RIGHT ATRIUM           Index LA diam:        4.20 cm 2.95 cm/m  RA Area:     12.70 cm LA Vol (A2C):   70.1 ml 49.26 ml/m RA Volume:   26.60 ml  18.69 ml/m LA Vol (A4C):   90.6 ml 63.67 ml/m LA Biplane Vol: 82.6 ml 58.04 ml/m  AORTIC VALVE AV Area (Vmax):    2.12 cm AV Area (Vmean):   1.88 cm AV Area (VTI):     2.13 cm AV Vmax:           104.00 cm/s AV Vmean:          79.600 cm/s AV VTI:            0.237 m AV Peak Grad:      4.3 mmHg AV Mean Grad:      3.0 mmHg LVOT Vmax:         97.30 cm/s LVOT Vmean:        66.100 cm/s LVOT VTI:          0.222 m LVOT/AV VTI ratio: 0.94  AORTA Ao Root diam: 3.00 cm Ao Asc diam:  3.00 cm MITRAL VALVE                 TRICUSPID VALVE MV Area (PHT): 4.10 cm      TR Peak grad:   26.4 mmHg MV Peak grad:  5.8 mmHg      TR Vmax:        257.00 cm/s MV Mean grad:  2.0 mmHg MV Vmax:       1.20 m/s      SHUNTS MV Vmean:      70.3 cm/s     Systemic VTI:  0.22 m MV  Decel Time: 185 msec      Systemic Diam: 1.70 cm MR Peak grad:    109.4 mmHg MR Mean grad:    64.0 mmHg MR Vmax:         523.00 cm/s MR Vmean:        369.0 cm/s MR PISA:         3.08 cm MR PISA Eff ROA: 18 mm MR PISA Radius:  0.70 cm MV E velocity: 101.00 cm/s MV A velocity: 118.00 cm/s MV E/A ratio:  0.86 Kirk Ruths MD Electronically signed by Kirk Ruths MD Signature Date/Time: 01/01/2020/3:56:00 PM    Final      Assessment and Plan:   1. Chronic combined CHF -BNP greater than 3000 on admit with chest x-ray concerning for pulmonary edema versus infection. - Echo with Moderate to severe global reduction in LV systolic function; grade 1  diastolic dysfunction; moderate LVH; moderate MR; severe LAE.  -She is getting IV diuresis - Diuresed 1.4L. however weight is up 1 lb 94>>95 lb.  - BUN/SCr increases since admit 19/1.08>>36/1.38>>46/1.49 -Given newly reduced LV function patient will need ischemic evaluation.  Patient was having intermittent chest discomfort with multiple risk factors >> been evaluated by Dr. Marlou Porch last year and recommended coronary CT but unable to complete due to multiple medical problems. -Could be nonischemic cardiomyopathy secondary to elevated heart rate and uncontrolled blood pressure (reports high blood pressure at home, especially in the morning) vs  ischemic with multivessel CAD and MR - Concern for low output cardiomyopathy  2.  Acute kidney injury - BUN/SCr increases since admit 19/1.08>>36/1.38>>46/1.49 - IV lasix reduce to 40mg  qd from BID today   3. New onset atrial fibrillation/flutter -Intermittent.  Currently in sinus rhythm.  -CHADSVASCs score of 4 for age, sex, HTN and CHF - Will plan Eliquis after R & L cath tomorrow.  - Heparin per MD  4.  Moderate mitral regurgitation - MV peak gradient, 5.8 mmHg. The mean  mitral valve gradient is 2.0 mmHg.  - ? Function MR  5. Tobacco smoking - Recommended cessation   6. COPD - No active wheezing  7.  Hypertension -Patient reports intermittent elevation of blood pressure at home, especially in mornings - Elevated - Home Cardizem on hold (will avoid given low EF) - Hold ACE/ARB due to AKI - Will review meds with MD  8. Urinary retention - Per primary team   For questions or updates, please contact [place] Please consult [email or web address] for contact info under     Jarrett Soho, PA  01/02/2020 1:24 PM

## 2020-01-02 NOTE — Consult Note (Signed)
Cardiology Consultation:   Patient ID: Regina Ortiz MRN: [id number]; DOB: [birth year]  Admit date: 12/31/2019 Date of Consult: 01/02/2020  Primary Care Provider: Gayland Curry, DO Primary Cardiologist: Regina Furbish, MD   Patient Profile:   Regina Ortiz is a 74 y.o. female with a hx of COPD, chronic left bundle branch block, anxiety/PTSD, rheumatoid arthritis, Raynaud's disease, cauda equina s/p surgical decompression 12/2018, tobacco abuse, urinary retension and HTN who is being seen today for the evaluation of reduced EF at the request of Regina. Ree Ortiz.   Low risk stress test January 2019.  Echocardiogram in 2019 showed normal LV function with grade 1 diastolic dysfunction.  Last seen by Regina. Marlou Ortiz virtually 12/2018.  Ongoing chest discomfort with multiple risk factor.  Recommended coronary CT with FFR but never completed.  History of Present Illness:   Regina Ortiz presented with sudden onset shortness of breath.  Patient has longstanding history of intermittent lower extremity edema.  BNP 3381 CXR with pulmonary edema vs infection  BUN/SCr increases since admit 19/1.08>>36/1.38>>46/1.49 Hs-troponin 72>>113 TSH normal COVID and influenza panel negative  Patient was admitted for acute respiratory failure with hypoxia secondary to pulmonary edema and acute heart failure.  Also treated with nebulizer and Solu-Medrol for acute on chronic bronchitis.  BP running high>> discontinued Coreg and Cardizem  Getting IV lasix 40mg  BID  >> diuresed 1.4L. however weight is up 1 lb 94>>95 lb.   Cardiology asked for evaluation of low EF. Echo with Moderate to severe global reduction in LV systolic function; grade 1  diastolic dysfunction; moderate LVH; moderate MR; severe LAE.   Patient reports sudden onset dyspnea leading to ER evaluation.  Breathing improved since here.  She smokes 1 to 2 cigarettes/day.  She used to smoke less than half a pack a day for 50 years, quit last year but  relapsed this year about 2 months ago.  Lives by herself and Aids for 8 hours during daytime.  She was hypertensive and tachycardic during initial presentation.  Review of telemetry shows new onset intermittent atrial fibrillation/atrial flutter.  Currently in sinus rhythm.  Past Medical History:  Diagnosis Date  . AIN grade I   . Anemia   . Anxiety   . Bruises easily   . Chronic diarrhea   . Chronic hyponatremia   . Colitis   . COPD (chronic obstructive pulmonary disease) ([place])   . Depression   . GERD (gastroesophageal reflux disease)   . History of chronic bronchitis   . History of vulvar dysplasia    serveral recurrency's   . Hypertension   . Insomnia   . LBBB (left bundle branch block) W/ PROLONGED PR   CARDIOLOGSIT-  Regina Ortiz-   LOV IN EPIC  . Macrocytosis   . Major depressive disorder   . Nocturia   . Osteoporosis   . Raynaud's disease   . Sciatica    right side  . Seronegative rheumatoid arthritis ([place])   . Short of breath on exertion   . Smokers' cough ([place])   . Venous insufficiency of leg    , Edema  . VIN III (vulvar intraepithelial neoplasia III)    RECURRENT -  . Vitamin B12 deficiency     Past Surgical History:  Procedure Laterality Date  . APPENDECTOMY  1992  . BIOPSY  08/19/2019   Procedure: BIOPSY;  Surgeon: [name], MD;  Location: [facility];  Service: Gastroenterology;;  . CARDIOVASCULAR STRESS TEST  06-23-2012  Regina [place]  NORMAL LEXISCAN MYOVIEW/ EF 83%/  NO ISCHEMIA  . CO2 LASER APPLICATION  [id number]   Procedure: CO2 LASER APPLICATION;  Surgeon: Janie Morning, MD PHD;  Location: [facility];  Service: Gynecology;  Laterality: N/A;  Laser Vaporization  . CO2 LASER APPLICATION N/A [id number]   Procedure: CO2 LASER APPLICATION;  Surgeon: [name], MD;  Location: [facility];  Service: Gynecology;  Laterality: N/A;  . CO2 LASER APPLICATION N/A [id number]   Procedure: CO2 LASER APPLICATION of the vulva;   Surgeon: Janie Morning, MD;  Location: [facility];  Service: Gynecology;  Laterality: N/A;  . COLONOSCOPY WITH PROPOFOL N/A 08/19/2019   Procedure: COLONOSCOPY WITH PROPOFOL;  Surgeon: [name], MD;  Location: [place];  Service: Gastroenterology;  Laterality: N/A;  . ESOPHAGOGASTRODUODENOSCOPY (EGD) WITH PROPOFOL N/A 08/19/2019   Procedure: ESOPHAGOGASTRODUODENOSCOPY (EGD) WITH PROPOFOL;  Surgeon: [name], MD;  Location: [place];  Service: Gastroenterology;  Laterality: N/A;  . EXCISION VULVAR LESIONS  07/2012  . HEMORRHOID SURGERY N/A 04/15/2013   Procedure: Excision of external and internal  hemorrhoid with AIN, Exam under anesthesia;  Surgeon: Odis Hollingshead, MD;  Location: [facility];  Service: General;  Laterality: N/A;  . HIP PINNING,CANNULATED Right 03/15/2019   Procedure: Maebelle Munroe OF FEMORAL NECK FRACTURE;  Surgeon: Renette Butters, MD;  Location: [place];  Service: Orthopedics;  Laterality: Right;  . POLYPECTOMY  08/19/2019   Procedure: POLYPECTOMY;  Surgeon: [name], MD;  Location: [facility];  Service: Gastroenterology;;  . REPAIR FISTULA IN ANO/  I & D PERIRECTAL ABSCESS  10-26-2002  Regina Johnathan Hausen  . TRANSTHORACIC ECHOCARDIOGRAM  06-23-2012   NORMAL LVSF/ EF 55-60%  . VAGINAL HYSTERECTOMY  1997   partial  . VULVAR LESION REMOVAL N/A 11/03/2014   Procedure: CO2 LASER OF VULVAR ;  Surgeon: [name], MD;  Location: [facility];  Service: Gynecology;  Laterality: N/A;  . VULVECTOMY N/A 03/26/2015   Procedure: WIDE LOCA  EXCISION OF VULVAR;  Surgeon: [name], MD;  Location: [place];  Service: Gynecology;  Laterality: N/A;  . Wide Local Excision of labia majora  04-04-2010   Right-sided lesion, CO2 ablation of right labia minora     Inpatient Medications: Scheduled Meds: . busPIRone  30 mg Oral BID  . calcium carbonate  500 mg of elemental calcium Oral Q breakfast  . enoxaparin (LOVENOX) injection  30 mg  Subcutaneous Q24H  . furosemide  40 mg Intravenous Daily  . hydroxychloroquine  200 mg Oral Daily  . melatonin  3 mg Oral QHS  . methylPREDNISolone (SOLU-MEDROL) injection  40 mg Intravenous Q12H  . nicotine  7 mg Transdermal Daily  . sertraline  50 mg Oral Daily  . sodium chloride flush  3 mL Intravenous Q12H   Continuous Infusions: . sodium chloride     PRN Meds: sodium chloride, acetaminophen, ALPRAZolam, levalbuterol, metoprolol tartrate, sodium chloride flush  Allergies:    Allergies  Allergen Reactions  . Latex Swelling and Other (See Comments)     fever blisters  . Augmentin [Amoxicillin-Pot Clavulanate] Nausea And Vomiting  . Doxycycline Other (See Comments)    Blurred vision, nervous, unsteady  . Hydrocodone Itching    Acetaminophen:itching, confusion, hallucinations-side effects-Onset date 07/15/2018  . Lactose Intolerance (Gi) Other (See Comments)    Unknown, per pt  . Lialda [Mesalamine] Diarrhea  . Prinivil [Lisinopril] Diarrhea  . Triamcinolone Acetonide Other (See Comments)    REDNESS AND PAIN  . Amlodipine  Rash  . Lotensin [Benazepril Hcl] Rash    Social History:   Social History   Socioeconomic History  . Marital status: Widowed    Spouse name: Not on file  . Number of children: Not on file  . Years of education: Not on file  . Highest education level: Not on file  Occupational History  . Not on file  Tobacco Use  . Smoking status: Current Every Day Smoker    Types: Cigarettes  . Smokeless tobacco: Never Used  . Tobacco comment: 2 cigarettes a day  Substance and Sexual Activity  . Alcohol use: Not Currently    Alcohol/week: 2.0 standard drinks    Types: 2 Cans of beer per week  . Drug use: No  . Sexual activity: Yes  Other Topics Concern  . Not on file  Social History Narrative   Social History      Diet? reg      Do you drink/eat things with caffeine? yes      Marital status?                  Widowed                   What year were  you married? 1993      Do you live in a house, apartment, assisted living, condo, trailer, etc.? House condo      Is it one or more stories? 2 stories       How many persons live in your home? 1      Do you have any pets in your home? (please list) 0      Highest level of education completed?1 year college       Current or past profession: telemarketer      Do you exercise?                           yes           Type & how often?  Walk 2 - 3  Outside/inside  everyday       Advanced Directives      Do you have a living will? yes      Do you have a DNR form?      yes                            If not, do you want to discuss one?       Do you have signed POA/HPOA for forms? No       Functional Status      Do you have difficulty bathing or dressing yourself? yes      Do you have difficulty preparing food or eating? yes      Do you have difficulty managing your medications? yes      Do you have difficulty managing your finances? yes      Do you have difficulty affording your medications? No       Social Determinants of Health   Financial Resource Strain:   . Difficulty of Paying Living Expenses:   Food Insecurity:   . Worried About Charity fundraiser in the Last Year:   . Arboriculturist in the Last Year:   Transportation Needs:   . Film/video editor (Medical):   Marland Kitchen Lack of Transportation (Non-Medical):   Physical Activity:   . Days  of Exercise per Week:   . Minutes of Exercise per Session:   Stress:   . Feeling of Stress :   Social Connections:   . Frequency of Communication with Friends and Family:   . Frequency of Social Gatherings with Friends and Family:   . Attends Religious Services:   . Active Member of Clubs or Organizations:   . Attends Archivist Meetings:   Marland Kitchen Marital Status:   Intimate Partner Violence:   . Fear of Current or Ex-Partner:   . Emotionally Abused:   Marland Kitchen Physically Abused:   . Sexually Abused:     Family History:     Family History  Problem Relation Age of Onset  . Diabetes Mother   . Hypertension Father   . Heart attack Father 92  . Lymphoma Son   . Cancer Son        lymphoblastic lymphoma     ROS:  Please see the history of present illness.  All other ROS reviewed and negative.     Physical Exam/Data:   Vitals:   01/02/20 0603 01/02/20 0653 01/02/20 0730 01/02/20 1319  BP: (!) 170/98 (!) 153/87 (!) 152/86 (!) 178/95  Pulse:   82 96  Resp:  (!) 22 14 (!) 24  Temp:   98.2 F (36.8 C) (!) 97.4 F (36.3 C)  TempSrc:   Oral Oral  SpO2:   98% 100%  Weight:      Height:        Intake/Output Summary (Last 24 hours) at 01/02/2020 1324 Last data filed at 01/02/2020 1206 Gross per 24 hour  Intake 1110 ml  Output 2450 ml  Net -1340 ml   Last 3 Weights 01/02/2020 01/01/2020 12/31/2019  Weight (lbs) 95 lb 8 oz 94 lb 9.2 oz 94 lb 9.6 oz  Weight (kg) 43.319 kg 42.9 kg 42.91 kg     Body mass index is 16.39 kg/m.  General: Frail elderly ill-appearing female in no acute distress  HEENT: normal Lymph: no adenopathy Neck: no JVD Endocrine:  No thryomegaly Vascular: No carotid bruits; FA pulses 2+ bilaterally without bruits  Cardiac:  normal S1, S2; RRR; systolic murmur at the apex Lungs:  clear to auscultation bilaterally, no wheezing, rhonchi or rales  Abd: soft, nontender, no hepatomegaly  Ext: no edema Musculoskeletal:  No deformities, BUE and BLE strength normal and equal Skin: Scattered ecchymosis on extremity upper greater than lower Neuro:  CNs 2-12 intact, no focal abnormalities noted Psych:  Normal affect   EKG:  The EKG was personally reviewed and demonstrates:  Sinus tachycardia at 120 bpm, LVH, LBBB Telemetry:  Telemetry was personally reviewed and demonstrates: Intermittent atrial fibrillation/flutter with rapid ventricular rate  Relevant CV Studies:  Echo 01/01/20 1. Moderate to severe global reduction in LV systolic function; grade 1  diastolic dysfunction; moderate LVH;  moderate MR; severe LAE.  2. Left ventricular ejection fraction, by estimation, is 30 to 35%. The  left ventricle has moderately decreased function. The left ventricle  demonstrates global hypokinesis. There is moderate left ventricular  hypertrophy. Left ventricular diastolic  parameters are consistent with Grade I diastolic dysfunction (impaired  relaxation).  3. Right ventricular systolic function is normal. The right ventricular  size is normal.  4. Left atrial size was severely dilated.  5. The mitral valve is normal in structure. Moderate mitral valve  regurgitation. No evidence of mitral stenosis.  6. The aortic valve is tricuspid. Aortic valve regurgitation is not  visualized. Mild aortic valve  sclerosis is present, with no evidence of  aortic valve stenosis.  7. The inferior vena cava is normal in size with greater than 50%  respiratory variability, suggesting right atrial pressure of 3 mmHg.   Laboratory Data:  High Sensitivity Troponin:   Recent Labs  Lab 12/31/19 0432 12/31/19 0644  TROPONINIHS 72* 113*     Chemistry Recent Labs  Lab 12/31/19 0432 01/01/20 0357 01/02/20 0317  NA 132* 127* 128*  K 3.8 4.9 4.4  CL 100 96* 96*  CO2 20* 22 23  GLUCOSE 169* 136* 160*  BUN 19 36* 46*  CREATININE 1.08* 1.38* 1.49*  CALCIUM 8.6* 8.2* 8.5*  GFRNONAA 51* 38* 34*  GFRAA 59* 44* 40*  ANIONGAP 12 9 9     Recent Labs  Lab 12/31/19 0939  PROT 6.4*  ALBUMIN 3.2*  AST 28  ALT 44  ALKPHOS 44  BILITOT 0.7   Hematology Recent Labs  Lab 12/31/19 0432  WBC 15.6*  RBC 3.45*  HGB 12.2  HCT 35.7*  MCV 103.5*  MCH 35.4*  MCHC 34.2  RDW 14.3  PLT 267   BNP Recent Labs  Lab 12/31/19 0432  BNP 3,381.8*    Radiology/Studies:  DG Chest Port 1 View  Result Date: 12/31/2019 CLINICAL DATA:  Shortness of breath EXAM: PORTABLE CHEST 1 VIEW COMPARISON:  03/12/2019 FINDINGS: The lungs are hyperinflated with diffuse interstitial prominence. No pleural effusion  or pneumothorax. Normal cardiomediastinal contours. Reticular opacities at both lung bases. IMPRESSION: COPD without focal airspace disease. Reticular opacities at both lung bases may indicate pulmonary edema or developing infection. Electronically Signed   By: Ulyses Jarred M.D.   On: 12/31/2019 05:26   ECHOCARDIOGRAM COMPLETE  Result Date: 01/01/2020    ECHOCARDIOGRAM REPORT   Patient Name:   Regina Ortiz Date of Exam: 01/01/2020 Medical Rec #:  [id number]          Height:       64.0 in Accession #:    [id number]         Weight:       94.6 lb Date of Birth:  06-Oct-1945          BSA:          1.423 m Patient Age:    74 years           BP:           140/73 mmHg Patient Gender: F                  HR:           72 bpm. Exam Location:  Inpatient Procedure: 2D Echo, Cardiac Doppler and Color Doppler Indications:    CHF-Acute Diastolic  History:        Patient has prior history of Echocardiogram examinations, most                 recent 09/24/2017. COPD, Arrythmias:LBBB,                 Signs/Symptoms:Shortness of Breath; Risk Factors:Current Smoker,                 Hypertension and Dyslipidemia.  Sonographer:    Clayton Lefort RDCS (AE) Referring Phys: [id number] RONDELL A SMITH IMPRESSIONS  1. Moderate to severe global reduction in LV systolic function; grade 1 diastolic dysfunction; moderate LVH; moderate MR; severe LAE.  2. Left ventricular ejection fraction, by estimation, is 30 to 35%. The left ventricle has moderately decreased function.  The left ventricle demonstrates global hypokinesis. There is moderate left ventricular hypertrophy. Left ventricular diastolic parameters are consistent with Grade I diastolic dysfunction (impaired relaxation).  3. Right ventricular systolic function is normal. The right ventricular size is normal.  4. Left atrial size was severely dilated.  5. The mitral valve is normal in structure. Moderate mitral valve regurgitation. No evidence of mitral stenosis.  6. The aortic valve is  tricuspid. Aortic valve regurgitation is not visualized. Mild aortic valve sclerosis is present, with no evidence of aortic valve stenosis.  7. The inferior vena cava is normal in size with greater than 50% respiratory variability, suggesting right atrial pressure of 3 mmHg. FINDINGS  Left Ventricle: Left ventricular ejection fraction, by estimation, is 30 to 35%. The left ventricle has moderately decreased function. The left ventricle demonstrates global hypokinesis. The left ventricular internal cavity size was normal in size. There is moderate left ventricular hypertrophy. Left ventricular diastolic parameters are consistent with Grade I diastolic dysfunction (impaired relaxation). Right Ventricle: The right ventricular size is normal. Right ventricular systolic function is normal. There is normal pulmonary artery systolic pressure. The tricuspid regurgitant velocity is 2.57 m/s, and with an assumed right atrial pressure of 3 mmHg,  the estimated right ventricular systolic pressure is [id number]. Left Atrium: Left atrial size was severely dilated. Right Atrium: Right atrial size was normal in size. Pericardium: There is no evidence of pericardial effusion. Mitral Valve: The mitral valve is normal in structure. Normal mobility of the mitral valve leaflets. Moderate mitral valve regurgitation. No evidence of mitral valve stenosis. MV peak gradient, 5.8 mmHg. The mean mitral valve gradient is 2.0 mmHg. Tricuspid Valve: The tricuspid valve is normal in structure. Tricuspid valve regurgitation is mild . No evidence of tricuspid stenosis. Aortic Valve: The aortic valve is tricuspid. Aortic valve regurgitation is not visualized. Mild aortic valve sclerosis is present, with no evidence of aortic valve stenosis. Aortic valve mean gradient measures 3.0 mmHg. Aortic valve peak gradient measures 4.3 mmHg. Aortic valve area, by VTI measures 2.13 cm. Pulmonic Valve: The pulmonic valve was not well visualized. Pulmonic valve  regurgitation is mild. No evidence of pulmonic stenosis. Aorta: The aortic root is normal in size and structure. Venous: The inferior vena cava is normal in size with greater than 50% respiratory variability, suggesting right atrial pressure of 3 mmHg. IAS/Shunts: No atrial level shunt detected by color flow Doppler. Additional Comments: Moderate to severe global reduction in LV systolic function; grade 1 diastolic dysfunction; moderate LVH; moderate MR; severe LAE.  LEFT VENTRICLE PLAX 2D LVIDd:         4.70 cm LVIDs:         3.80 cm LV PW:         1.40 cm LV IVS:        1.30 cm LVOT diam:     1.70 cm LV SV:         50 LV SV Index:   35 LVOT Area:     2.27 cm  LV Volumes (MOD) LV vol d, MOD A2C: 84.5 ml LV vol d, MOD A4C: 99.0 ml LV vol s, MOD A2C: 57.8 ml LV vol s, MOD A4C: 66.8 ml LV SV MOD A2C:     26.7 ml LV SV MOD A4C:     99.0 ml LV SV MOD BP:      29.8 ml RIGHT VENTRICLE            IVC RV Basal diam:  2.50 cm  IVC diam: 2.00 cm RV S prime:     9.36 cm/s TAPSE (M-mode): 2.2 cm LEFT ATRIUM             Index       RIGHT ATRIUM           Index LA diam:        4.20 cm 2.95 cm/m  RA Area:     12.70 cm LA Vol (A2C):   70.1 ml 49.26 ml/m RA Volume:   26.60 ml  18.69 ml/m LA Vol (A4C):   90.6 ml 63.67 ml/m LA Biplane Vol: 82.6 ml 58.04 ml/m  AORTIC VALVE AV Area (Vmax):    2.12 cm AV Area (Vmean):   1.88 cm AV Area (VTI):     2.13 cm AV Vmax:           104.00 cm/s AV Vmean:          79.600 cm/s AV VTI:            0.237 m AV Peak Grad:      4.3 mmHg AV Mean Grad:      3.0 mmHg LVOT Vmax:         97.30 cm/s LVOT Vmean:        66.100 cm/s LVOT VTI:          0.222 m LVOT/AV VTI ratio: 0.94  AORTA Ao Root diam: 3.00 cm Ao Asc diam:  3.00 cm MITRAL VALVE                 TRICUSPID VALVE MV Area (PHT): 4.10 cm      TR Peak grad:   26.4 mmHg MV Peak grad:  5.8 mmHg      TR Vmax:        257.00 cm/s MV Mean grad:  2.0 mmHg MV Vmax:       1.20 m/s      SHUNTS MV Vmean:      70.3 cm/s     Systemic VTI:  0.22 m MV  Decel Time: 185 msec      Systemic Diam: 1.70 cm MR Peak grad:    109.4 mmHg MR Mean grad:    64.0 mmHg MR Vmax:         523.00 cm/s MR Vmean:        369.0 cm/s MR PISA:         3.08 cm MR PISA Eff ROA: 18 mm MR PISA Radius:  0.70 cm MV E velocity: 101.00 cm/s MV A velocity: 118.00 cm/s MV E/A ratio:  0.86 Kirk Ruths MD Electronically signed by Kirk Ruths MD Signature Date/Time: 01/01/2020/3:56:00 PM    Final      Assessment and Plan:   1. Chronic combined CHF -BNP greater than 3000 on admit with chest x-ray concerning for pulmonary edema versus infection. - Echo with Moderate to severe global reduction in LV systolic function; grade 1  diastolic dysfunction; moderate LVH; moderate MR; severe LAE.  -She is getting IV diuresis - Diuresed 1.4L. however weight is up 1 lb 94>>95 lb.  - BUN/SCr increases since admit 19/1.08>>36/1.38>>46/1.49 -Given newly reduced LV function patient will need ischemic evaluation.  Patient was having intermittent chest discomfort with multiple risk factors >> been evaluated by Regina. Marlou Ortiz last year and recommended coronary CT but unable to complete due to multiple medical problems. -Could be nonischemic cardiomyopathy secondary to elevated heart rate and uncontrolled blood pressure (reports high blood pressure at home, especially in the morning) vs  ischemic with multivessel CAD and MR - Concern for low output cardiomyopathy  2.  Acute kidney injury - BUN/SCr increases since admit 19/1.08>>36/1.38>>46/1.49 - IV lasix reduce to 40mg  qd from BID today   3. New onset atrial fibrillation/flutter -Intermittent.  Currently in sinus rhythm.  -CHADSVASCs score of 4 for age, sex, HTN and CHF - Will plan Eliquis after R & L cath tomorrow.  - Heparin per MD  4.  Moderate mitral regurgitation - MV peak gradient, 5.8 mmHg. The mean  mitral valve gradient is 2.0 mmHg.  - ? Function MR  5. Tobacco smoking - Recommended cessation   6. COPD - No active wheezing  7.  Hypertension -Patient reports intermittent elevation of blood pressure at home, especially in mornings - Elevated - Home Cardizem on hold (will avoid given low EF) - Hold ACE/ARB due to AKI - Will review meds with MD  8. Urinary retention - Per primary team   For questions or updates, please contact [place] Please consult [email or web address] for contact info under     Jarrett Soho, PA  01/02/2020 1:24 PM

## 2020-01-02 NOTE — Plan of Care (Signed)
  Problem: Clinical Measurements: Goal: Ability to maintain clinical measurements within normal limits will improve Outcome: Progressing   Problem: Clinical Measurements: Goal: Respiratory complications will improve Outcome: Progressing   Problem: Clinical Measurements: Goal: Cardiovascular complication will be avoided Outcome: Progressing   

## 2020-01-02 NOTE — Progress Notes (Signed)
Pt's blood pressure increased from 163/95 at 2338 to 173/99 at 0309.  Informed the on call physician.  The hospitalist modified the PRN order for IV Metoprolol 2.5 mg to every four hours as needed for a sustained heart rate greater than [id number] or systolic blood pressure greater than 160.  The IV 2.5 mg Metoprolol has been given.  Will continue to monitor.  Lupita Dawn, RN

## 2020-01-02 NOTE — Progress Notes (Signed)
BP increasing to 178/95. Pt very restless and anxious. No other complaints. IV Metoprolol 2.5mg  given as prn. Will continue to monitor.  Jerald Kief, RN

## 2020-01-02 NOTE — Progress Notes (Signed)
PROGRESS NOTE    Regina Ortiz  [id number] DOB: 24-Apr-1946 DOA: 12/31/2019 PCP: Gayland Curry, DO   Brief Narrative:  HPI on 12/31/2019 by Dr. Fuller Plan Regina Ortiz is a 74 y.o. female with medical history significant of hypertension, COPD, chronic hyponatremia, LBBB, rheumatoid arthritis, Raynaud's disease, neurogenic bladder, cauda equina syndrome  S/P SURGICAL decompression in 01/28/2019 by Dr. Ellene Route, anemia, anxiety, depression, tobacco abuse, and GERD presents with complaints of shortness of breath.  She had been up all night as she was sweating profusely and having to change her shirt.  Thereafter, developed progressively worsening shortness of breath.  Associated symptoms included wheezing, very minimal cough, leg swelling, and later develops substernal chest pressure without radiation.  Patient continues to smoke  a couple cigarettes throughout the week, but would like to quit.  She feels that her anxiety has worsened despite her Zoloft dose being recently increased from 50 mg to 100 mg daily a couple days ago.  At baseline patient reports that she is not on oxygen.  En route with EMS patient was noted to be hypoxic and was initially placed on a nonrebreather.  She received 324 mg of aspirin and 2 nitroglycerin prior to arrival.  Interim history Admitted with respiratory failure and pulmonary edema, found to have a new systolic CHF.  Cardiology consulted  Assessment & Plan   Acute respiratory failure with hypoxia/pulmonary edema/acute diastolic congestive heart failure exacerbation- New systolic heart failure -Patient presented with shortness of breath and was found to be hypoxic and placed on nonrebreather -Chest x-ray was concerning for pulmonary edema -Patient with elevated BNP of 3381.8 on admission -Echocardiogram January 2019 showed an EF of 50-55 to 60% with grade 1 diastolic dysfunction -Continue IV Lasix-was on 40 mg twice daily however given AKI, have  decreased dose to 40 mg daily -Monitor intake and output, daily weights -Echocardiogram shows an EF of 30 to [id number], grade 1 diastolic dysfunction, moderate MR and LVH, severe LAE. -Given new findings on echocardiogram, have consulted cardiology -Has been weaned off of supplemental oxygen and maintaining oxygen saturations in the 90s  Acute on chronic bronchitis -Patient does not use oxygen at baseline or any inhalers -PFTs from 2016 noted signs of obstruction with concern for emphysema -Continue nebulizer treatments as well as Solu-Medrol-we will wean Solu-Medrol  Hypertensive urgency -BPs has been elevated -Patient was on Cardizem at home however cardiology recommended changing this to Coreg   Elevated troponin -Suspect this is demand ischemia to the above -High-sensitivity troponin peaked to 113 -Patient with no chest pain at this time  Acute kidney injury -Possibly secondary to Lasix use -Creatinine up to 1.49 (baseline in 2020 approximate 0.7-0.9) -Lasix dose decreased -Continue to monitor BMP, intake and output  Rheumatoid arthritis/Raynaud's disease -Patient on chronic steroids -Continue Plaquenil -Placed on Solu-Medrol-decreasing dose  Anxiety/depression -Continue BuSpar and Xanax as needed  Urinary retention -Patient with reports of difficulty urinating, and has a history of this -Continue in and out cath-if done more than 3 times, will place Foley catheter (discussed with patient, she states she does not want to have Foley catheter placed again) -Will continue to monitor  Hyponatremia -Appears to be chronic in bed at baseline -Patient is on SSRIs -Continue to monitor BMP  Chronic LBBB  Tobacco abuse -Continue nicotine patch -Discussed smoking cessation  DVT Prophylaxis Lovenox  Code Status: Full  Family Communication: None at bedside  Disposition Plan:  Status is: Inpatient  Remains inpatient appropriate because:IV treatments appropriate due  to  intensity of illness or inability to take PO   Dispo: The patient is from: Home              Anticipated d/c is to: Home              Anticipated d/c date is: 2 days              Patient currently is not medically stable to d/c.  Consultants Cardiology via phone on admission  Procedures  None  Antibiotics   Anti-infectives (From admission, onward)   Start     Dose/Rate Route Frequency Ordered Stop   12/31/19 1000  hydroxychloroquine (PLAQUENIL) tablet 200 mg     200 mg Oral Daily 12/31/19 0806        Subjective:   Jaimey Ortiz seen and examined today.  Feels breathing has improved but continues to feel tired.  Denies current chest pain, abdominal pain, nausea or vomiting, diarrhea or constipation, dizziness or headache.    Objective:   Vitals:   01/02/20 0541 01/02/20 0603 01/02/20 0653 01/02/20 0730  BP: (!) 170/102 (!) 170/98 (!) 153/87 (!) 152/86  Pulse:    82  Resp: 18  (!) 22 14  Temp:    98.2 F (36.8 C)  TempSrc:    Oral  SpO2:    98%  Weight:      Height:        Intake/Output Summary (Last 24 hours) at 01/02/2020 0945 Last data filed at 01/02/2020 0551 Gross per 24 hour  Intake 970 ml  Output 1500 ml  Net -530 ml   Filed Weights   12/31/19 1727 01/01/20 0619 01/02/20 0300  Weight: 42.9 kg 42.9 kg 43.3 kg   Exam  General: Well developed, thin, chronically ill-appearing, NAD  HEENT: NCAT, mucous membranes moist.   Cardiovascular: S1 S2 auscultated, RRR  Respiratory: Diminished breath sounds however clear, no wheezing  Abdomen: Soft, nontender, nondistended, + bowel sounds  Extremities: warm dry without cyanosis clubbing. LE edema, L>R  Neuro: AAOx3, nonfocal  Psych: Depressed, however appropriate   Data Reviewed: I have personally reviewed following labs and imaging studies  CBC: Recent Labs  Lab 12/31/19 0432  WBC 15.6*  NEUTROABS 10.6*  HGB 12.2  HCT 35.7*  MCV 103.5*  PLT [id number]   Basic Metabolic Panel: Recent Labs  Lab  12/31/19 0432 12/31/19 0939 01/01/20 0357 01/02/20 0317  NA 132*  --  127* 128*  K 3.8  --  4.9 4.4  CL 100  --  96* 96*  CO2 20*  --  22 23  GLUCOSE 169*  --  136* 160*  BUN 19  --  36* 46*  CREATININE 1.08*  --  1.38* 1.49*  CALCIUM 8.6*  --  8.2* 8.5*  MG  --  1.8  --   --    GFR: Estimated Creatinine Clearance: 22.6 mL/min (A) (by C-G formula based on SCr of 1.49 mg/dL (H)). Liver Function Tests: Recent Labs  Lab 12/31/19 0939  AST 28  ALT 44  ALKPHOS 44  BILITOT 0.7  PROT 6.4*  ALBUMIN 3.2*   No results for input(s): LIPASE, AMYLASE in the last 168 hours. No results for input(s): AMMONIA in the last 168 hours. Coagulation Profile: No results for input(s): INR, PROTIME in the last 168 hours. Cardiac Enzymes: No results for input(s): CKTOTAL, CKMB, CKMBINDEX, TROPONINI in the last 168 hours. BNP (last 3 results) No results for input(s): PROBNP in the last 8760 hours. HbA1C:  No results for input(s): HGBA1C in the last 72 hours. CBG: No results for input(s): GLUCAP in the last 168 hours. Lipid Profile: No results for input(s): CHOL, HDL, LDLCALC, TRIG, CHOLHDL, LDLDIRECT in the last 72 hours. Thyroid Function Tests: Recent Labs    12/31/19 0644  TSH 2.889   Anemia Panel: No results for input(s): VITAMINB12, FOLATE, FERRITIN, TIBC, IRON, RETICCTPCT in the last 72 hours. Urine analysis:    Component Value Date/Time   COLORURINE YELLOW 03/12/2019 1148   APPEARANCEUR CLEAR 03/12/2019 1148   LABSPEC 1.003 (L) 03/12/2019 1148   PHURINE 7.0 03/12/2019 1148   GLUCOSEU NEGATIVE 03/12/2019 1148   HGBUR NEGATIVE 03/12/2019 1148   BILIRUBINUR NEGATIVE 03/12/2019 1148   KETONESUR NEGATIVE 03/12/2019 1148   PROTEINUR 30 (A) 03/12/2019 1148   UROBILINOGEN 0.2 09/22/2012 1100   NITRITE POSITIVE (A) 03/12/2019 1148   LEUKOCYTESUR MODERATE (A) 03/12/2019 1148   Sepsis Labs: @LABRCNTIP (procalcitonin:4,lacticidven:4)  ) Recent Results (from the past 240 hour(s))   Respiratory Panel by RT PCR (Flu A&B, Covid) - Nasopharyngeal Swab     Status: None   Collection Time: 12/31/19  7:15 AM   Specimen: Nasopharyngeal Swab  Result Value Ref Range Status   SARS Coronavirus 2 by RT PCR NEGATIVE NEGATIVE Final    Comment: (NOTE) SARS-CoV-2 target nucleic acids are NOT DETECTED. The SARS-CoV-2 RNA is generally detectable in upper respiratoy specimens during the acute phase of infection. The lowest concentration of SARS-CoV-2 viral copies this assay can detect is 131 copies/mL. A negative result does not preclude SARS-Cov-2 infection and should not be used as the sole basis for treatment or other patient management decisions. A negative result may occur with  improper specimen collection/handling, submission of specimen other than nasopharyngeal swab, presence of viral mutation(s) within the areas targeted by this assay, and inadequate number of viral copies (<131 copies/mL). A negative result must be combined with clinical observations, patient history, and epidemiological information. The expected result is Negative. Fact Sheet for Patients:  PinkCheek.be Fact Sheet for Healthcare Providers:  GravelBags.it This test is not yet ap proved or cleared by the [place] FDA and  has been authorized for detection and/or diagnosis of SARS-CoV-2 by FDA under an Emergency Use Authorization (EUA). This EUA will remain  in effect (meaning this test can be used) for the duration of the COVID-19 declaration under Section 564(b)(1) of the Act, 21 U.S.C. section 360bbb-3(b)(1), unless the authorization is terminated or revoked sooner.    Influenza A by PCR NEGATIVE NEGATIVE Final   Influenza B by PCR NEGATIVE NEGATIVE Final    Comment: (NOTE) The Xpert Xpress SARS-CoV-2/FLU/RSV assay is intended as an aid in  the diagnosis of influenza from Nasopharyngeal swab specimens and  should not be used as a sole  basis for treatment. Nasal washings and  aspirates are unacceptable for Xpert Xpress SARS-CoV-2/FLU/RSV  testing. Fact Sheet for Patients: PinkCheek.be Fact Sheet for Healthcare Providers: GravelBags.it This test is not yet approved or cleared by the [place] FDA and  has been authorized for detection and/or diagnosis of SARS-CoV-2 by  FDA under an Emergency Use Authorization (EUA). This EUA will remain  in effect (meaning this test can be used) for the duration of the  Covid-19 declaration under Section 564(b)(1) of the Act, 21  U.S.C. section 360bbb-3(b)(1), unless the authorization is  terminated or revoked. Performed at [facility], [place] [address], [place], [place] [address]       Radiology Studies: ECHOCARDIOGRAM COMPLETE  Result Date: 01/01/2020    ECHOCARDIOGRAM REPORT   Patient Name:   Edit DRAPER Wheless Date of Exam: 01/01/2020 Medical Rec #:  [id number]          Height:       64.0 in Accession #:    [id number]         Weight:       94.6 lb Date of Birth:  June 20, 1946          BSA:          1.423 m Patient Age:    15 years           BP:           140/73 mmHg Patient Gender: F                  HR:           72 bpm. Exam Location:  Inpatient Procedure: 2D Echo, Cardiac Doppler and Color Doppler Indications:    CHF-Acute Diastolic  History:        Patient has prior history of Echocardiogram examinations, most                 recent 09/24/2017. COPD, Arrythmias:LBBB,                 Signs/Symptoms:Shortness of Breath; Risk Factors:Current Smoker,                 Hypertension and Dyslipidemia.  Sonographer:    Clayton Lefort RDCS (AE) Referring Phys: [id number] RONDELL A SMITH IMPRESSIONS  1. Moderate to severe global reduction in LV systolic function; grade 1 diastolic dysfunction; moderate LVH; moderate MR; severe LAE.  2. Left ventricular ejection fraction, by estimation, is 30 to 35%. The left ventricle has moderately decreased  function. The left ventricle demonstrates global hypokinesis. There is moderate left ventricular hypertrophy. Left ventricular diastolic parameters are consistent with Grade I diastolic dysfunction (impaired relaxation).  3. Right ventricular systolic function is normal. The right ventricular size is normal.  4. Left atrial size was severely dilated.  5. The mitral valve is normal in structure. Moderate mitral valve regurgitation. No evidence of mitral stenosis.  6. The aortic valve is tricuspid. Aortic valve regurgitation is not visualized. Mild aortic valve sclerosis is present, with no evidence of aortic valve stenosis.  7. The inferior vena cava is normal in size with greater than 50% respiratory variability, suggesting right atrial pressure of 3 mmHg. FINDINGS  Left Ventricle: Left ventricular ejection fraction, by estimation, is 30 to 35%. The left ventricle has moderately decreased function. The left ventricle demonstrates global hypokinesis. The left ventricular internal cavity size was normal in size. There is moderate left ventricular hypertrophy. Left ventricular diastolic parameters are consistent with Grade I diastolic dysfunction (impaired relaxation). Right Ventricle: The right ventricular size is normal. Right ventricular systolic function is normal. There is normal pulmonary artery systolic pressure. The tricuspid regurgitant velocity is 2.57 m/s, and with an assumed right atrial pressure of 3 mmHg,  the estimated right ventricular systolic pressure is [id number] mmHg. Left Atrium: Left atrial size was severely dilated. Right Atrium: Right atrial size was normal in size. Pericardium: There is no evidence of pericardial effusion. Mitral Valve: The mitral valve is normal in structure. Normal mobility of the mitral valve leaflets. Moderate mitral valve regurgitation. No evidence of mitral valve stenosis. MV peak gradient, 5.8 mmHg. The mean mitral valve gradient is 2.0 mmHg. Tricuspid Valve: The tricuspid  valve is normal in  structure. Tricuspid valve regurgitation is mild . No evidence of tricuspid stenosis. Aortic Valve: The aortic valve is tricuspid. Aortic valve regurgitation is not visualized. Mild aortic valve sclerosis is present, with no evidence of aortic valve stenosis. Aortic valve mean gradient measures 3.0 mmHg. Aortic valve peak gradient measures 4.3 mmHg. Aortic valve area, by VTI measures 2.13 cm. Pulmonic Valve: The pulmonic valve was not well visualized. Pulmonic valve regurgitation is mild. No evidence of pulmonic stenosis. Aorta: The aortic root is normal in size and structure. Venous: The inferior vena cava is normal in size with greater than 50% respiratory variability, suggesting right atrial pressure of 3 mmHg. IAS/Shunts: No atrial level shunt detected by color flow Doppler. Additional Comments: Moderate to severe global reduction in LV systolic function; grade 1 diastolic dysfunction; moderate LVH; moderate MR; severe LAE.  LEFT VENTRICLE PLAX 2D LVIDd:         4.70 cm LVIDs:         3.80 cm LV PW:         1.40 cm LV IVS:        1.30 cm LVOT diam:     1.70 cm LV SV:         50 LV SV Index:   35 LVOT Area:     2.27 cm  LV Volumes (MOD) LV vol d, MOD A2C: 84.5 ml LV vol d, MOD A4C: 99.0 ml LV vol s, MOD A2C: 57.8 ml LV vol s, MOD A4C: 66.8 ml LV SV MOD A2C:     26.7 ml LV SV MOD A4C:     99.0 ml LV SV MOD BP:      29.8 ml RIGHT VENTRICLE            IVC RV Basal diam:  2.50 cm    IVC diam: 2.00 cm RV S prime:     9.36 cm/s TAPSE (M-mode): 2.2 cm LEFT ATRIUM             Index       RIGHT ATRIUM           Index LA diam:        4.20 cm 2.95 cm/m  RA Area:     12.70 cm LA Vol (A2C):   70.1 ml 49.26 ml/m RA Volume:   26.60 ml  18.69 ml/m LA Vol (A4C):   90.6 ml 63.67 ml/m LA Biplane Vol: 82.6 ml 58.04 ml/m  AORTIC VALVE AV Area (Vmax):    2.12 cm AV Area (Vmean):   1.88 cm AV Area (VTI):     2.13 cm AV Vmax:           104.00 cm/s AV Vmean:          79.600 cm/s AV VTI:            0.237 m AV  Peak Grad:      4.3 mmHg AV Mean Grad:      3.0 mmHg LVOT Vmax:         97.30 cm/s LVOT Vmean:        66.100 cm/s LVOT VTI:          0.222 m LVOT/AV VTI ratio: 0.94  AORTA Ao Root diam: 3.00 cm Ao Asc diam:  3.00 cm MITRAL VALVE                 TRICUSPID VALVE MV Area (PHT): 4.10 cm      TR Peak grad:   26.4 mmHg MV Peak  grad:  5.8 mmHg      TR Vmax:        257.00 cm/s MV Mean grad:  2.0 mmHg MV Vmax:       1.20 m/s      SHUNTS MV Vmean:      70.3 cm/s     Systemic VTI:  0.22 m MV Decel Time: 185 msec      Systemic Diam: 1.70 cm MR Peak grad:    109.4 mmHg MR Mean grad:    64.0 mmHg MR Vmax:         523.00 cm/s MR Vmean:        369.0 cm/s MR PISA:         3.08 cm MR PISA Eff ROA: 18 mm MR PISA Radius:  0.70 cm MV E velocity: 101.00 cm/s MV A velocity: 118.00 cm/s MV E/A ratio:  0.86 Kirk Ruths MD Electronically signed by Kirk Ruths MD Signature Date/Time: 01/01/2020/3:56:00 PM    Final      Scheduled Meds: . busPIRone  30 mg Oral BID  . calcium carbonate  500 mg of elemental calcium Oral Q breakfast  . enoxaparin (LOVENOX) injection  30 mg Subcutaneous Q24H  . furosemide  40 mg Intravenous Daily  . hydroxychloroquine  200 mg Oral Daily  . melatonin  3 mg Oral QHS  . methylPREDNISolone (SOLU-MEDROL) injection  60 mg Intravenous Q8H  . nicotine  7 mg Transdermal Daily  . sertraline  50 mg Oral Daily  . sodium chloride flush  3 mL Intravenous Q12H   Continuous Infusions: . sodium chloride       LOS: 2 days   Time Spent in minutes   45 minutes  Laterria Lasota D.O. on 01/02/2020 at 9:45 AM  Between 7am to 7pm - Please see pager noted on [email or web address]  After 7pm go to [email or web address]  And look for the night coverage person covering for me after hours  [facility] Office  [phone]

## 2020-01-02 NOTE — Progress Notes (Signed)
Initial Nutrition Assessment  DOCUMENTATION CODES:   Underweight, Severe malnutrition in context of chronic illness  INTERVENTION:    Ensure Enlive po BID, each supplement provides 350 kcal and 20 grams of protein  Magic cup TID with meals, each supplement provides 290 kcal and 9 grams of protein  MVI daily   NUTRITION DIAGNOSIS:   Severe Malnutrition related to chronic illness(COPD, CHF) as evidenced by severe fat depletion, severe muscle depletion.  GOAL:   Patient will meet greater than or equal to 90% of their needs  MONITOR:   PO intake, Supplement acceptance, Weight trends, Labs, I & O's  REASON FOR ASSESSMENT:   Malnutrition Screening Tool    ASSESSMENT:   Patient with PMH significant for HTN, COPD, chronic hyponatremia, LBBB, RA, neurogenic bladder, cauda equina syndrome s/p surgical decompression, depression, and GERD. Presents this admission with acute exacerbation of CHF.   Spoke with caregiver and pt at bedside. Denies loss of appetite PTA. Typically eats three meals with snacks daily that consist of B-coffee, danish, 2 eggs, grapes, toast L- [place] cheese sandwich D- PB jelly sandwich S-cakes, cookies, chips. Has chocolate Ensure on hand but does not drink them as "that flavor" gives her "diarrhea." Appetite okay this admission. Last three meal completions charted as 20%,50%, and 70%. Discussed the importance of protein intake for preservation of lean body mass. Pt willing to drink vanilla Ensure.   Pt endorses a UBW of 95 lb and denies recent wt loss. Reports having a small frame during her lifetime (highest weight around 125 lb). Records show multiple stated weights making it difficult to quantify actual weight loss.   I/O: -2,040 ml since admit  UOP: 1,050 ml x 24 hrs   Medications: os cal, 40 mg lasix daily, solumedrol Labs: Na 128 (L)   NUTRITION - FOCUSED PHYSICAL EXAM:    Most Recent Value  Orbital Region  Mild depletion  Upper Arm Region  Severe  depletion  Thoracic and Lumbar Region  Moderate depletion  Buccal Region  Severe depletion  Temple Region  Moderate depletion  Clavicle Bone Region  Severe depletion  Clavicle and Acromion Bone Region  Severe depletion  Scapular Bone Region  Moderate depletion  Dorsal Hand  Moderate depletion  Patellar Region  Severe depletion  Anterior Thigh Region  Severe depletion  Posterior Calf Region  Severe depletion  Edema (RD Assessment)  Mild  Hair  Reviewed  Eyes  Reviewed  Mouth  Reviewed  Skin  Reviewed  Nails  Reviewed     Diet Order:   Diet Order            Diet Heart Room service appropriate? Yes; Fluid consistency: Thin; Fluid restriction: 2000 mL Fluid  Diet effective now              EDUCATION NEEDS:   Education needs have been addressed  Skin:  Skin Assessment: Skin Integrity Issues: Skin Integrity Issues:: Stage I, Stage II Stage I: coccyx Stage II: coccyx  Last BM:  4/30  Height:   Ht Readings from Last 1 Encounters:  12/31/19 5\' 4"  (1.626 m)    Weight:   Wt Readings from Last 1 Encounters:  01/02/20 43.3 kg    BMI:  Body mass index is 16.39 kg/m.  Estimated Nutritional Needs:   Kcal:  1350-1550 kcal  Protein:  70-85 gram  Fluid:  >/= 1.3 L/day   Mariana Single RD, LDN Clinical Nutrition Pager listed in [place]

## 2020-01-02 NOTE — Progress Notes (Signed)
BP was still high at 170/98.  Informed on call physician who ordered Hydralazine 5 mg IV once.  After it was given, the pt's BP went down to 153/87.  Will continue to monitor.  Lupita Dawn, RN

## 2020-01-03 ENCOUNTER — Encounter (HOSPITAL_COMMUNITY)
Admission: EM | Disposition: A | Discharge status: Home Health | Payer: Self-pay | Source: Home / Self Care | Attending: Internal Medicine

## 2020-01-03 DIAGNOSIS — I5043 Acute on chronic combined systolic (congestive) and diastolic (congestive) heart failure: Secondary | ICD-10-CM | POA: Diagnosis not present

## 2020-01-03 DIAGNOSIS — I447 Left bundle-branch block, unspecified: Secondary | ICD-10-CM | POA: Diagnosis not present

## 2020-01-03 DIAGNOSIS — R778 Other specified abnormalities of plasma proteins: Secondary | ICD-10-CM | POA: Diagnosis not present

## 2020-01-03 DIAGNOSIS — I340 Nonrheumatic mitral (valve) insufficiency: Secondary | ICD-10-CM | POA: Diagnosis not present

## 2020-01-03 DIAGNOSIS — J810 Acute pulmonary edema: Secondary | ICD-10-CM

## 2020-01-03 HISTORY — PX: RIGHT/LEFT HEART CATH AND CORONARY ANGIOGRAPHY: CATH118266

## 2020-01-03 LAB — BASIC METABOLIC PANEL
Anion gap: 12 (ref 5–15)
BUN: 44 mg/dL — ABNORMAL HIGH (ref 8–23)
CO2: 21 mmol/L — ABNORMAL LOW (ref 22–32)
Calcium: 8.6 mg/dL — ABNORMAL LOW (ref 8.9–10.3)
Chloride: 93 mmol/L — ABNORMAL LOW (ref 98–111)
Creatinine, Ser: 1.17 mg/dL — ABNORMAL HIGH (ref 0.44–1.00)
GFR calc Af Amer: 53 mL/min — ABNORMAL LOW (ref >60)
GFR calc non Af Amer: 46 mL/min — ABNORMAL LOW (ref >60)
Glucose, Bld: 129 mg/dL — ABNORMAL HIGH (ref 70–99)
Potassium: 4.2 mmol/L (ref 3.5–5.1)
Sodium: 126 mmol/L — ABNORMAL LOW (ref 135–145)

## 2020-01-03 LAB — POCT I-STAT 7, (LYTES, BLD GAS, ICA,H+H)
Acid-Base Excess: 0 mmol/L (ref 0.0–2.0)
Bicarbonate: 24.1 mmol/L (ref 20.0–28.0)
Calcium, Ion: 1.2 mmol/L (ref 1.15–1.40)
HCT: 30 % — ABNORMAL LOW (ref 36.0–46.0)
Hemoglobin: 10.2 g/dL — ABNORMAL LOW (ref 12.0–15.0)
O2 Saturation: 99 %
Potassium: 4.4 mmol/L (ref 3.5–5.1)
Sodium: 126 mmol/L — ABNORMAL LOW (ref 135–145)
TCO2: 25 mmol/L (ref 22–32)
pCO2 arterial: 34.2 mmHg (ref 32.0–48.0)
pH, Arterial: 7.456 — ABNORMAL HIGH (ref 7.350–7.450)
pO2, Arterial: 134 mmHg — ABNORMAL HIGH (ref 83.0–108.0)

## 2020-01-03 LAB — POCT I-STAT EG7
Acid-Base Excess: 0 mmol/L (ref 0.0–2.0)
Bicarbonate: 24.8 mmol/L (ref 20.0–28.0)
Calcium, Ion: 1.2 mmol/L (ref 1.15–1.40)
HCT: 30 % — ABNORMAL LOW (ref 36.0–46.0)
Hemoglobin: 10.2 g/dL — ABNORMAL LOW (ref 12.0–15.0)
O2 Saturation: 76 %
Potassium: 4.4 mmol/L (ref 3.5–5.1)
Sodium: 127 mmol/L — ABNORMAL LOW (ref 135–145)
TCO2: 26 mmol/L (ref 22–32)
pCO2, Ven: 38.3 mmHg — ABNORMAL LOW (ref 44.0–60.0)
pH, Ven: 7.419 (ref 7.250–7.430)
pO2, Ven: 40 mmHg (ref 32.0–45.0)

## 2020-01-03 LAB — HEMOGLOBIN AND HEMATOCRIT, BLOOD
HCT: 30.2 % — ABNORMAL LOW (ref 36.0–46.0)
Hemoglobin: 11 g/dL — ABNORMAL LOW (ref 12.0–15.0)

## 2020-01-03 SURGERY — RIGHT/LEFT HEART CATH AND CORONARY ANGIOGRAPHY
Anesthesia: LOCAL

## 2020-01-03 MED ORDER — LABETALOL HCL 5 MG/ML IV SOLN
10.0000 mg | INTRAVENOUS | Status: AC | PRN
  Administered 2020-01-03: 10 mg via INTRAVENOUS
  Filled 2020-01-03: qty 4

## 2020-01-03 MED ORDER — FENTANYL CITRATE (PF) 100 MCG/2ML IJ SOLN
INTRAMUSCULAR | Status: AC
  Filled 2020-01-03: qty 2

## 2020-01-03 MED ORDER — CARVEDILOL 3.125 MG PO TABS
3.1250 mg | ORAL_TABLET | Freq: Two times a day (BID) | ORAL | Status: DC
  Administered 2020-01-03 – 2020-01-04 (×2): 3.125 mg via ORAL
  Filled 2020-01-03 (×2): qty 1

## 2020-01-03 MED ORDER — HYDRALAZINE HCL 20 MG/ML IJ SOLN
INTRAMUSCULAR | Status: DC | PRN
  Administered 2020-01-03: 10 mg via INTRAVENOUS

## 2020-01-03 MED ORDER — ACETAMINOPHEN 325 MG PO TABS: 650.0000 mg | ORAL_TABLET | ORAL | Status: DC | PRN

## 2020-01-03 MED ORDER — METHYLPREDNISOLONE SODIUM SUCC 40 MG IJ SOLR
40.0000 mg | Freq: Every day | INTRAMUSCULAR | Status: DC
  Administered 2020-01-04: 40 mg via INTRAVENOUS
  Filled 2020-01-03 (×2): qty 1

## 2020-01-03 MED ORDER — IOHEXOL 350 MG/ML SOLN
INTRAVENOUS | Status: DC | PRN
  Administered 2020-01-03: 09:00:00 85 mL

## 2020-01-03 MED ORDER — HEPARIN (PORCINE) IN NACL 1000-0.9 UT/500ML-% IV SOLN
INTRAVENOUS | Status: AC
  Filled 2020-01-03: qty 1000

## 2020-01-03 MED ORDER — SODIUM CHLORIDE 0.9 % IV SOLN: INTRAVENOUS | Status: AC

## 2020-01-03 MED ORDER — HYDRALAZINE HCL 20 MG/ML IJ SOLN
INTRAMUSCULAR | Status: AC
  Filled 2020-01-03: qty 1

## 2020-01-03 MED ORDER — ASPIRIN 81 MG PO CHEW
81.0000 mg | CHEWABLE_TABLET | Freq: Every day | ORAL | Status: DC
  Administered 2020-01-04 – 2020-01-06 (×3): 81 mg via ORAL
  Filled 2020-01-03 (×3): qty 1

## 2020-01-03 MED ORDER — ATORVASTATIN CALCIUM 80 MG PO TABS
80.0000 mg | ORAL_TABLET | Freq: Every day | ORAL | Status: DC
  Administered 2020-01-03 – 2020-01-06 (×4): 80 mg via ORAL
  Filled 2020-01-03 (×4): qty 1

## 2020-01-03 MED ORDER — SODIUM CHLORIDE 0.9 % IV SOLN: 250.0000 mL | INTRAVENOUS | Status: DC | PRN

## 2020-01-03 MED ORDER — SODIUM CHLORIDE 0.9% FLUSH
3.0000 mL | Freq: Two times a day (BID) | INTRAVENOUS | Status: DC
  Administered 2020-01-04 – 2020-01-05 (×2): 3 mL via INTRAVENOUS

## 2020-01-03 MED ORDER — FENTANYL CITRATE (PF) 100 MCG/2ML IJ SOLN
INTRAMUSCULAR | Status: DC | PRN
  Administered 2020-01-03 (×2): 25 ug via INTRAVENOUS

## 2020-01-03 MED ORDER — MIDAZOLAM HCL 2 MG/2ML IJ SOLN
INTRAMUSCULAR | Status: AC
  Filled 2020-01-03: qty 2

## 2020-01-03 MED ORDER — HEPARIN (PORCINE) IN NACL 1000-0.9 UT/500ML-% IV SOLN
INTRAVENOUS | Status: DC | PRN
  Administered 2020-01-03 (×2): 500 mL

## 2020-01-03 MED ORDER — LIDOCAINE HCL (PF) 1 % IJ SOLN
INTRAMUSCULAR | Status: DC | PRN
  Administered 2020-01-03: 15 mL

## 2020-01-03 MED ORDER — SODIUM CHLORIDE 0.9% FLUSH: 3.0000 mL | INTRAVENOUS | Status: DC | PRN

## 2020-01-03 MED ORDER — HYDRALAZINE HCL 20 MG/ML IJ SOLN: 10.0000 mg | INTRAMUSCULAR | Status: AC | PRN

## 2020-01-03 MED ORDER — LIDOCAINE HCL (PF) 1 % IJ SOLN
INTRAMUSCULAR | Status: AC
  Filled 2020-01-03: qty 30

## 2020-01-03 MED ORDER — MIDAZOLAM HCL 2 MG/2ML IJ SOLN
INTRAMUSCULAR | Status: DC | PRN
  Administered 2020-01-03 (×2): 0.5 mg via INTRAVENOUS

## 2020-01-03 MED ORDER — FUROSEMIDE 10 MG/ML IJ SOLN
40.0000 mg | Freq: Two times a day (BID) | INTRAMUSCULAR | Status: DC
  Administered 2020-01-03 – 2020-01-06 (×6): 40 mg via INTRAVENOUS
  Filled 2020-01-03 (×5): qty 4

## 2020-01-03 SURGICAL SUPPLY — 12 items
CATH INFINITI 5FR MPB2 (CATHETERS) ×1 IMPLANT
CATH INFINITI 5FR MULTPACK ANG (CATHETERS) ×1 IMPLANT
CATH SWAN GANZ 7F STRAIGHT (CATHETERS) ×1 IMPLANT
GUIDEWIRE .025 260CM (WIRE) ×1 IMPLANT
KIT HEART LEFT (KITS) ×2 IMPLANT
PACK CARDIAC CATHETERIZATION (CUSTOM PROCEDURE TRAY) ×2 IMPLANT
SHEATH PINNACLE 5F 10CM (SHEATH) ×1 IMPLANT
SHEATH PINNACLE 7F 10CM (SHEATH) ×1 IMPLANT
SHEATH PROBE COVER 6X72 (BAG) ×1 IMPLANT
TRANSDUCER W/STOPCOCK (MISCELLANEOUS) ×2 IMPLANT
TUBING CIL FLEX 10 FLL-RA (TUBING) ×2 IMPLANT
WIRE EMERALD 3MM-J .035X150CM (WIRE) ×1 IMPLANT

## 2020-01-03 NOTE — Progress Notes (Signed)
PT Cancellation Note  Patient Details Name: Regina Ortiz MRN: [id number] DOB: 1946-08-01   Cancelled Treatment:    Reason Eval/Treat Not Completed: Other (comment). Pt on bedrest after cardiac cath.   [place] 01/03/2020, 11:47 AM [place] Pager [phone] Office [phone]

## 2020-01-03 NOTE — Progress Notes (Addendum)
PROGRESS NOTE    Regina Ortiz  [id number] DOB: 1946/08/18 DOA: 12/31/2019 PCP: Gayland Curry, DO   Brief Narrative:  HPI on 12/31/2019 by Dr. Fuller Plan Regina Ortiz is a 74 y.o. female with medical history significant of hypertension, COPD, chronic hyponatremia, LBBB, rheumatoid arthritis, Raynaud's disease, neurogenic bladder, cauda equina syndrome  S/P SURGICAL decompression in 01/28/2019 by Dr. Ellene Route, anemia, anxiety, depression, tobacco abuse, and GERD presents with complaints of shortness of breath.  She had been up all night as she was sweating profusely and having to change her shirt.  Thereafter, developed progressively worsening shortness of breath.  Associated symptoms included wheezing, very minimal cough, leg swelling, and later develops substernal chest pressure without radiation.  Patient continues to smoke  a couple cigarettes throughout the week, but would like to quit.  She feels that her anxiety has worsened despite her Zoloft dose being recently increased from 50 mg to 100 mg daily a couple days ago.  At baseline patient reports that she is not on oxygen.  En route with EMS patient was noted to be hypoxic and was initially placed on a nonrebreather.  She received 324 mg of aspirin and 2 nitroglycerin prior to arrival.  Interim history Admitted with respiratory failure and pulmonary edema, found to have a new systolic CHF.  Cardiology consulted, planning for [facility] today.  Assessment & Plan   Acute respiratory failure with hypoxia/pulmonary edema/acute diastolic congestive heart failure exacerbation- New systolic heart failure -Patient presented with shortness of breath and was found to be hypoxic and placed on nonrebreather -Chest x-ray was concerning for pulmonary edema -Patient with elevated BNP of 3381.8 on admission -Echocardiogram January 2019 showed an EF of 50-55 to 60% with grade 1 diastolic dysfunction -Continue IV Lasix-was on 40 mg twice daily  however given AKI, have decreased dose to 40 mg daily -Monitor intake and output, daily weights -Echocardiogram shows an EF of 30 to [id number], grade 1 diastolic dysfunction, moderate MR and LVH, severe LAE. -Has been weaned off of supplemental oxygen and maintaining oxygen saturations in the 90s -Cardiology consulted and appreciated.  Planning for right and left heart cath today.  Acute on chronic bronchitis -Patient does not use oxygen at baseline or any inhalers -PFTs from 2016 noted signs of obstruction with concern for emphysema -Continue nebulizer treatments as well as Solu-Medrol--continue to wean  Hypertensive urgency -BPs has been elevated -Patient was on Cardizem at home however cardiology recommended changing this to Coreg   Elevated troponin -Suspect this is demand ischemia to the above -High-sensitivity troponin peaked to 113 -Patient with no chest pain at this time  Acute kidney injury -Possibly secondary to Lasix use -Creatinine peaked at 1.49 (baseline in 2020 approximate 0.7-0.9) -Lasix dose decreased -Creatinine down to 1.17 today -Continue to monitor BMP, intake and output  Rheumatoid arthritis/Raynaud's disease -Patient on chronic steroids -Continue Plaquenil -Placed on Solu-Medrol-decreasing dose  Anxiety/depression -Continue BuSpar and Xanax as needed  Urinary retention -Patient with reports of difficulty urinating, and has a history of this -Continue in and out cath-if done more than 3 times, will place Foley catheter (discussed with patient, she states she does not want to have Foley catheter placed again) -Will continue to monitor  Hyponatremia -Appears to be chronic in bed at baseline -Patient is on SSRIs -Continue to monitor BMP  Chronic LBBB  Tobacco abuse -Continue nicotine patch -Discussed smoking cessation  Severe malnutrition  -nutrition consulted, continue supplements  DVT Prophylaxis Lovenox  Code Status: Full  Family  Communication: None at bedside  Disposition Plan:  Status is: Inpatient  Remains inpatient appropriate because:IV treatments appropriate due to intensity of illness or inability to take PO.  Right and left heart cath as well as cardiology recommendations.   Dispo: The patient is from: Home              Anticipated d/c is to: Home              Anticipated d/c date is: 2 days              Patient currently is not medically stable to d/c.  Consultants Cardiology  Procedures  Echocardiogram  Antibiotics   Anti-infectives (From admission, onward)   Start     Dose/Rate Route Frequency Ordered Stop   12/31/19 1000  [MAR Hold]  hydroxychloroquine (PLAQUENIL) tablet 200 mg     (MAR Hold since Tue 01/03/2020 at 0826.Hold Reason: Transfer to a Procedural area.)   200 mg Oral Daily 12/31/19 0806        Subjective:   Regina Ortiz seen and examined today.  Patient states she is anxious for her procedure later today.  She denies current chest pain.  She feels her breathing has improved but she continues to feel very tired.  She denies current abdominal pain, chest pain, nausea or vomiting, diarrhea or constipation, dizziness or headache.    Objective:   Vitals:   01/03/20 0013 01/03/20 0537 01/03/20 0740 01/03/20 0828  BP: (!) 159/95 (!) 163/99 (!) 163/90   Pulse: 80 92 79   Resp: 17 17 18    Temp: 98.5 F (36.9 C) 98 F (36.7 C) (!) 97.5 F (36.4 C)   TempSrc: Oral Oral Oral   SpO2: 97% 99% 95% 100%  Weight:  43 kg    Height:        Intake/Output Summary (Last 24 hours) at 01/03/2020 0913 Last data filed at 01/03/2020 0546 Gross per 24 hour  Intake 483 ml  Output 2000 ml  Net -1517 ml   Filed Weights   01/01/20 0619 01/02/20 0300 01/03/20 0537  Weight: 42.9 kg 43.3 kg 43 kg   Exam  General: Well developed, thin, chronically ill-appearing, NAD  HEENT: NCAT, mucous membranes moist.   Cardiovascular: S1 S2 auscultated, RRR, soft SEM  Respiratory: Diminished breath sounds, no  wheezing  Abdomen: Soft, nontender, nondistended, + bowel sounds  Extremities: warm dry without cyanosis clubbing  Neuro: AAOx3, nonfocal  Psych: Appropriate mood and affect  Data Reviewed: I have personally reviewed following labs and imaging studies  CBC: Recent Labs  Lab 12/31/19 0432 01/03/20 0345  WBC 15.6*  --   NEUTROABS 10.6*  --   HGB 12.2 11.0*  HCT 35.7* 30.2*  MCV 103.5*  --   PLT 267  --    Basic Metabolic Panel: Recent Labs  Lab 12/31/19 0432 12/31/19 0939 01/01/20 0357 01/02/20 0317 01/03/20 0345  NA 132*  --  127* 128* 126*  K 3.8  --  4.9 4.4 4.2  CL 100  --  96* 96* 93*  CO2 20*  --  22 23 21*  GLUCOSE 169*  --  136* 160* 129*  BUN 19  --  36* 46* 44*  CREATININE 1.08*  --  1.38* 1.49* 1.17*  CALCIUM 8.6*  --  8.2* 8.5* 8.6*  MG  --  1.8  --   --   --    GFR: Estimated Creatinine Clearance: 28.6 mL/min (A) (by C-G formula based  on SCr of 1.17 mg/dL (H)). Liver Function Tests: Recent Labs  Lab 12/31/19 0939  AST 28  ALT 44  ALKPHOS 44  BILITOT 0.7  PROT 6.4*  ALBUMIN 3.2*   No results for input(s): LIPASE, AMYLASE in the last 168 hours. No results for input(s): AMMONIA in the last 168 hours. Coagulation Profile: No results for input(s): INR, PROTIME in the last 168 hours. Cardiac Enzymes: No results for input(s): CKTOTAL, CKMB, CKMBINDEX, TROPONINI in the last 168 hours. BNP (last 3 results) No results for input(s): PROBNP in the last 8760 hours. HbA1C: No results for input(s): HGBA1C in the last 72 hours. CBG: No results for input(s): GLUCAP in the last 168 hours. Lipid Profile: No results for input(s): CHOL, HDL, LDLCALC, TRIG, CHOLHDL, LDLDIRECT in the last 72 hours. Thyroid Function Tests: No results for input(s): TSH, T4TOTAL, FREET4, T3FREE, THYROIDAB in the last 72 hours. Anemia Panel: No results for input(s): VITAMINB12, FOLATE, FERRITIN, TIBC, IRON, RETICCTPCT in the last 72 hours. Urine analysis:    Component Value  Date/Time   COLORURINE YELLOW 03/12/2019 1148   APPEARANCEUR CLEAR 03/12/2019 1148   LABSPEC 1.003 (L) 03/12/2019 1148   PHURINE 7.0 03/12/2019 1148   GLUCOSEU NEGATIVE 03/12/2019 1148   HGBUR NEGATIVE 03/12/2019 1148   BILIRUBINUR NEGATIVE 03/12/2019 1148   KETONESUR NEGATIVE 03/12/2019 1148   PROTEINUR 30 (A) 03/12/2019 1148   UROBILINOGEN 0.2 09/22/2012 1100   NITRITE POSITIVE (A) 03/12/2019 1148   LEUKOCYTESUR MODERATE (A) 03/12/2019 1148   Sepsis Labs: @LABRCNTIP (procalcitonin:4,lacticidven:4)  ) Recent Results (from the past 240 hour(s))  Respiratory Panel by RT PCR (Flu A&B, Covid) - Nasopharyngeal Swab     Status: None   Collection Time: 12/31/19  7:15 AM   Specimen: Nasopharyngeal Swab  Result Value Ref Range Status   SARS Coronavirus 2 by RT PCR NEGATIVE NEGATIVE Final    Comment: (NOTE) SARS-CoV-2 target nucleic acids are NOT DETECTED. The SARS-CoV-2 RNA is generally detectable in upper respiratoy specimens during the acute phase of infection. The lowest concentration of SARS-CoV-2 viral copies this assay can detect is 131 copies/mL. A negative result does not preclude SARS-Cov-2 infection and should not be used as the sole basis for treatment or other patient management decisions. A negative result may occur with  improper specimen collection/handling, submission of specimen other than nasopharyngeal swab, presence of viral mutation(s) within the areas targeted by this assay, and inadequate number of viral copies (<131 copies/mL). A negative result must be combined with clinical observations, patient history, and epidemiological information. The expected result is Negative. Fact Sheet for Patients:  PinkCheek.be Fact Sheet for Healthcare Providers:  GravelBags.it This test is not yet ap proved or cleared by the [place] FDA and  has been authorized for detection and/or diagnosis of SARS-CoV-2 by FDA  under an Emergency Use Authorization (EUA). This EUA will remain  in effect (meaning this test can be used) for the duration of the COVID-19 declaration under Section 564(b)(1) of the Act, 21 U.S.C. section 360bbb-3(b)(1), unless the authorization is terminated or revoked sooner.    Influenza A by PCR NEGATIVE NEGATIVE Final   Influenza B by PCR NEGATIVE NEGATIVE Final    Comment: (NOTE) The Xpert Xpress SARS-CoV-2/FLU/RSV assay is intended as an aid in  the diagnosis of influenza from Nasopharyngeal swab specimens and  should not be used as a sole basis for treatment. Nasal washings and  aspirates are unacceptable for Xpert Xpress SARS-CoV-2/FLU/RSV  testing. Fact Sheet for Patients: PinkCheek.be Fact Sheet  for Healthcare Providers: GravelBags.it This test is not yet approved or cleared by the [place] and  has been authorized for detection and/or diagnosis of SARS-CoV-2 by  FDA under an Emergency Use Authorization (EUA). This EUA will remain  in effect (meaning this test can be used) for the duration of the  Covid-19 declaration under Section 564(b)(1) of the Act, 21  U.S.C. section 360bbb-3(b)(1), unless the authorization is  terminated or revoked. Performed at [facility], [place] [address] [place], [place] [address]       Radiology Studies: ECHOCARDIOGRAM COMPLETE  Result Date: 01/01/2020    ECHOCARDIOGRAM REPORT   Patient Name:   Regina Ortiz Date of Exam: 01/01/2020 Medical Rec #:  [id number]          Height:       64.0 in Accession #:    [id number]         Weight:       94.6 lb Date of Birth:  Feb 02, 1946          BSA:          1.423 m Patient Age:    25 years           BP:           140/73 mmHg Patient Gender: F                  HR:           72 bpm. Exam Location:  Inpatient Procedure: 2D Echo, Cardiac Doppler and Color Doppler Indications:    CHF-Acute Diastolic  History:        Patient has prior  history of Echocardiogram examinations, most                 recent 09/24/2017. COPD, Arrythmias:LBBB,                 Signs/Symptoms:Shortness of Breath; Risk Factors:Current Smoker,                 Hypertension and Dyslipidemia.  Sonographer:    Clayton Lefort RDCS (AE) Referring Phys: [id number] RONDELL A SMITH IMPRESSIONS  1. Moderate to severe global reduction in LV systolic function; grade 1 diastolic dysfunction; moderate LVH; moderate MR; severe LAE.  2. Left ventricular ejection fraction, by estimation, is 30 to 35%. The left ventricle has moderately decreased function. The left ventricle demonstrates global hypokinesis. There is moderate left ventricular hypertrophy. Left ventricular diastolic parameters are consistent with Grade I diastolic dysfunction (impaired relaxation).  3. Right ventricular systolic function is normal. The right ventricular size is normal.  4. Left atrial size was severely dilated.  5. The mitral valve is normal in structure. Moderate mitral valve regurgitation. No evidence of mitral stenosis.  6. The aortic valve is tricuspid. Aortic valve regurgitation is not visualized. Mild aortic valve sclerosis is present, with no evidence of aortic valve stenosis.  7. The inferior vena cava is normal in size with greater than 50% respiratory variability, suggesting right atrial pressure of 3 mmHg. FINDINGS  Left Ventricle: Left ventricular ejection fraction, by estimation, is 30 to 35%. The left ventricle has moderately decreased function. The left ventricle demonstrates global hypokinesis. The left ventricular internal cavity size was normal in size. There is moderate left ventricular hypertrophy. Left ventricular diastolic parameters are consistent with Grade I diastolic dysfunction (impaired relaxation). Right Ventricle: The right ventricular size is normal. Right ventricular systolic function is normal. There is normal pulmonary artery systolic  pressure. The tricuspid regurgitant velocity is 2.57  m/s, and with an assumed right atrial pressure of 3 mmHg,  the estimated right ventricular systolic pressure is [id number]. Left Atrium: Left atrial size was severely dilated. Right Atrium: Right atrial size was normal in size. Pericardium: There is no evidence of pericardial effusion. Mitral Valve: The mitral valve is normal in structure. Normal mobility of the mitral valve leaflets. Moderate mitral valve regurgitation. No evidence of mitral valve stenosis. MV peak gradient, 5.8 mmHg. The mean mitral valve gradient is 2.0 mmHg. Tricuspid Valve: The tricuspid valve is normal in structure. Tricuspid valve regurgitation is mild . No evidence of tricuspid stenosis. Aortic Valve: The aortic valve is tricuspid. Aortic valve regurgitation is not visualized. Mild aortic valve sclerosis is present, with no evidence of aortic valve stenosis. Aortic valve mean gradient measures 3.0 mmHg. Aortic valve peak gradient measures 4.3 mmHg. Aortic valve area, by VTI measures 2.13 cm. Pulmonic Valve: The pulmonic valve was not well visualized. Pulmonic valve regurgitation is mild. No evidence of pulmonic stenosis. Aorta: The aortic root is normal in size and structure. Venous: The inferior vena cava is normal in size with greater than 50% respiratory variability, suggesting right atrial pressure of 3 mmHg. IAS/Shunts: No atrial level shunt detected by color flow Doppler. Additional Comments: Moderate to severe global reduction in LV systolic function; grade 1 diastolic dysfunction; moderate LVH; moderate MR; severe LAE.  LEFT VENTRICLE PLAX 2D LVIDd:         4.70 cm LVIDs:         3.80 cm LV PW:         1.40 cm LV IVS:        1.30 cm LVOT diam:     1.70 cm LV SV:         50 LV SV Index:   35 LVOT Area:     2.27 cm  LV Volumes (MOD) LV vol d, MOD A2C: 84.5 ml LV vol d, MOD A4C: 99.0 ml LV vol s, MOD A2C: 57.8 ml LV vol s, MOD A4C: 66.8 ml LV SV MOD A2C:     26.7 ml LV SV MOD A4C:     99.0 ml LV SV MOD BP:      29.8 ml RIGHT VENTRICLE             IVC RV Basal diam:  2.50 cm    IVC diam: 2.00 cm RV S prime:     9.36 cm/s TAPSE (M-mode): 2.2 cm LEFT ATRIUM             Index       RIGHT ATRIUM           Index LA diam:        4.20 cm 2.95 cm/m  RA Area:     12.70 cm LA Vol (A2C):   70.1 ml 49.26 ml/m RA Volume:   26.60 ml  18.69 ml/m LA Vol (A4C):   90.6 ml 63.67 ml/m LA Biplane Vol: 82.6 ml 58.04 ml/m  AORTIC VALVE AV Area (Vmax):    2.12 cm AV Area (Vmean):   1.88 cm AV Area (VTI):     2.13 cm AV Vmax:           104.00 cm/s AV Vmean:          79.600 cm/s AV VTI:            0.237 m AV Peak Grad:      4.3 mmHg AV  Mean Grad:      3.0 mmHg LVOT Vmax:         97.30 cm/s LVOT Vmean:        66.100 cm/s LVOT VTI:          0.222 m LVOT/AV VTI ratio: 0.94  AORTA Ao Root diam: 3.00 cm Ao Asc diam:  3.00 cm MITRAL VALVE                 TRICUSPID VALVE MV Area (PHT): 4.10 cm      TR Peak grad:   26.4 mmHg MV Peak grad:  5.8 mmHg      TR Vmax:        257.00 cm/s MV Mean grad:  2.0 mmHg MV Vmax:       1.20 m/s      SHUNTS MV Vmean:      70.3 cm/s     Systemic VTI:  0.22 m MV Decel Time: 185 msec      Systemic Diam: 1.70 cm MR Peak grad:    109.4 mmHg MR Mean grad:    64.0 mmHg MR Vmax:         523.00 cm/s MR Vmean:        369.0 cm/s MR PISA:         3.08 cm MR PISA Eff ROA: 18 mm MR PISA Radius:  0.70 cm MV E velocity: 101.00 cm/s MV A velocity: 118.00 cm/s MV E/A ratio:  0.86 Kirk Ruths MD Electronically signed by Kirk Ruths MD Signature Date/Time: 01/01/2020/3:56:00 PM    Final      Scheduled Meds: . [MAR Hold] busPIRone  30 mg Oral BID  . [MAR Hold] calcium carbonate  500 mg of elemental calcium Oral Q breakfast  . [MAR Hold] enoxaparin (LOVENOX) injection  40 mg Subcutaneous Q24H  . [MAR Hold] feeding supplement (ENSURE ENLIVE)  237 mL Oral BID BM  . [MAR Hold] furosemide  40 mg Intravenous Daily  . [MAR Hold] hydrALAZINE  25 mg Oral Q8H  . [MAR Hold] hydroxychloroquine  200 mg Oral Daily  . [MAR Hold] melatonin  3 mg Oral QHS  .  [MAR Hold] methylPREDNISolone (SOLU-MEDROL) injection  40 mg Intravenous Q12H  . [MAR Hold] multivitamin with minerals  1 tablet Oral Daily  . [MAR Hold] nicotine  7 mg Transdermal Daily  . [MAR Hold] sertraline  50 mg Oral Daily  . [MAR Hold] sodium chloride flush  3 mL Intravenous Q12H  . [MAR Hold] sodium chloride flush  3 mL Intravenous Q12H   Continuous Infusions: . [MAR Hold] sodium chloride    . sodium chloride    . sodium chloride 10 mL/hr at 01/03/20 0559     LOS: 3 days   Time Spent in minutes   45 minutes  Samira Acero D.O. on 01/03/2020 at 9:13 AM  Between 7am to 7pm - Please see pager noted on [email or web address]  After 7pm go to [email or web address]  And look for the night coverage person covering for me after hours  [facility] Office  [phone]

## 2020-01-03 NOTE — Progress Notes (Signed)
Site area- right  Site Prior to Removal- 0   Pressure Applied For-  20 MInutes   Bedrest Beginning at - 1010   Manual- Yes   Patient Status During Pull- Stable    Post Pull Groin Site- 0   Post Pull Instructions Given- Yes   Post Pull Pulses Present- Yes    Dressing Applied- Tegaderm and Gauze Dressing    Comments:             

## 2020-01-03 NOTE — CV Procedure (Signed)
   Left and right heart cath via right femoral artery and vein.  Real-time vascular ultrasound used for access.  LAD diagonal [place] 111 bifurcation stenosis with LAD 50 to 70% and diagonal 60%.  Within the region of heavy calcification.  Left main is widely patent  Circumflex is widely patent  Right coronary is dominant and widely patent  Left ventriculogram demonstrates EF 40% with mid anterior wall towards the apex hypokinesis more dyssynchrony related to left bundle branch block.  Sheath pull and hold.  IV hydralazine given for poorly controlled blood pressure.

## 2020-01-03 NOTE — Progress Notes (Signed)
PT Cancellation Note  Patient Details Name: Regina Ortiz MRN: [id number] DOB: 1946-03-22   Cancelled Treatment:    Reason Eval/Treat Not Completed: Patient at procedure or test/unavailable. Pt at cardiac cath.   [place] 01/03/2020, 9:41 AM  [place] Pager [phone] Office [phone]

## 2020-01-03 NOTE — Interval H&P Note (Signed)
Cath Lab Visit (complete for each Cath Lab visit)  Clinical Evaluation Leading to the Procedure:   ACS: No.  Non-ACS:    Anginal Classification: CCS III  Anti-ischemic medical therapy: Minimal Therapy (1 class of medications)  Non-Invasive Test Results: High-risk stress test findings: cardiac mortality >3%/year  Prior CABG: No previous CABG      History and Physical Interval Note:  01/03/2020 8:33 AM  Regina Ortiz  has presented today for surgery, with the diagnosis of chf.  The various methods of treatment have been discussed with the patient and family. After consideration of risks, benefits and other options for treatment, the patient has consented to  Procedure(s): RIGHT/LEFT HEART CATH AND CORONARY ANGIOGRAPHY (N/A) as a surgical intervention.  The patient's history has been reviewed, patient examined, no change in status, stable for surgery.  I have reviewed the patient's chart and labs.  Questions were answered to the patient's satisfaction.     Belva Crome III

## 2020-01-03 NOTE — Progress Notes (Signed)
Physical Therapy Treatment Patient Details Name: Regina Ortiz MRN: [id number] DOB: Jul 19, 1946 Today's Date: 01/03/2020    History of Present Illness 74 yo admitted with acute respiratory failure with tachypnea, Afib and acute CHF exacerbation. Cardiac cath 5/4 and will be treated medically. PMhx: HTN, COPD, LBBB, hyponatremia, RA, Raynaud's, cauda equina syndrome with neurogenic bladder, anxiety/depression    PT Comments    Pt with cardiac cath earlier today. Able to amb in hallway. Continue to recommend home with HHPT.    Follow Up Recommendations  Home health PT     Equipment Recommendations  None recommended by PT    Recommendations for Other Services       Precautions / Restrictions Precautions Precautions: Fall Restrictions Weight Bearing Restrictions: No    Mobility  Bed Mobility Overal bed mobility: Needs Assistance Bed Mobility: Supine to Sit     Supine to sit: Supervision;HOB elevated     General bed mobility comments: supervision for lines. Incr time  Transfers Overall transfer level: Needs assistance Equipment used: Rolling walker (2 wheeled);1 person hand held assist Transfers: Sit to/from [facility] Sit to Stand: Min guard;Supervision Stand pivot transfers: Min guard       General transfer comment: Assist for lines and safety  Ambulation/Gait Ambulation/Gait assistance: Supervision Gait Distance (Feet): 200 Feet Assistive device: Rolling walker (2 wheeled) Gait Pattern/deviations: Step-through pattern;Decreased stride length;Trunk flexed Gait velocity: decr Gait velocity interpretation: 1.31 - 2.62 ft/sec, indicative of limited community ambulator General Gait Details: Assist for lines and Barrister's clerk    Modified Rankin (Stroke Patients Only)       Balance Overall balance assessment: Needs assistance Sitting-balance support: No upper extremity supported;Feet  supported Sitting balance-Leahy Scale: Good     Standing balance support: No upper extremity supported;During functional activity Standing balance-Leahy Scale: Fair                              Cognition Arousal/Alertness: Awake/alert Behavior During Therapy: WFL for tasks assessed/performed Overall Cognitive Status: Within Functional Limits for tasks assessed                                        Exercises      General Comments General comments (skin integrity, edema, etc.): VSS      Pertinent Vitals/Pain      Home Living                      Prior Function            PT Goals (current goals can now be found in the care plan section) Progress towards PT goals: Progressing toward goals    Frequency    Min 3X/week      PT Plan Current plan remains appropriate    Co-evaluation              AM-PAC PT "6 Clicks" Mobility   Outcome Measure  Help needed turning from your back to your side while in a flat bed without using bedrails?: None Help needed moving from lying on your back to sitting on the side of a flat bed without using bedrails?: A Little Help needed moving to and from a bed to a chair (including a wheelchair)?: A Little  Help needed standing up from a chair using your arms (e.g., wheelchair or bedside chair)?: A Little Help needed to walk in hospital room?: A Little Help needed climbing 3-5 steps with a railing? : A Little 6 Click Score: 19    End of Session Equipment Utilized During Treatment: Gait belt Activity Tolerance: Patient tolerated treatment well Patient left: in chair;with call bell/phone within reach Nurse Communication: Mobility status PT Visit Diagnosis: Other abnormalities of gait and mobility (R26.89);Difficulty in walking, not elsewhere classified (R26.2);Muscle weakness (generalized) (M62.81)     Time: [id number] PT Time Calculation (min) (ACUTE ONLY): 25 min  Charges:  $Gait  Training: 23-37 mins                     [place] Pager [phone] Office [place] 01/03/2020, 3:45 PM

## 2020-01-03 NOTE — Progress Notes (Signed)
Pt arrived back to 4e13. Right groin level 0. VSS. Bedrest x4 hours. Pt instrcuted on limitations. Friend at bedside.

## 2020-01-03 NOTE — Progress Notes (Signed)
DAILY PROGRESS NOTE   Patient Name: Regina Ortiz Date of Encounter: 01/03/2020 Cardiologist: Candee Furbish, MD  Chief Complaint    Anxious about health  Patient Profile   Regina Ortiz is a 74 y.o. female with a hx of COPD, chronic left bundle branch block, anxiety/PTSD, rheumatoid arthritis, Raynaud's disease, cauda equina s/p surgical decompression 12/2018, tobacco abuse, urinary retension and HTN who is being seen today for the evaluation of reduced EF at the request of Dr. Ree Kida.   Low risk stress test January 2019.  Echocardiogram in 2019 showed normal LV function with grade 1 diastolic dysfunction.  Last seen by Dr. Marlou Porch virtually 12/2018.  Ongoing chest discomfort with multiple risk factor.  Recommended coronary CT with FFR but never completed.  Subjective   Cath films personally reviewed as well as right heart hemodynamics- mild to moderate reduction in CO, pulmonary venous hypertension with elevated LVEDP. Creatinine improved - will increase lasix back to 40 mg IV BID. There is probably a significant LAD lesion, however, sounds difficult to intervene upon therefore will optimize medical therapy. May need to consider TEE evaluation of MR if it remains significant after diuresis.  Objective   Vitals:   01/03/20 1005 01/03/20 1010 01/03/20 1015 01/03/20 1020  BP: (!) 154/68 (!) 156/62 (!) 156/70 (!) 155/71  Pulse: 85 82 80 81  Resp: 17 15 17 16   Temp:      TempSrc:      SpO2: 99% 100% 99% 99%  Weight:      Height:        Intake/Output Summary (Last 24 hours) at 01/03/2020 1039 Last data filed at 01/03/2020 0546 Gross per 24 hour  Intake 483 ml  Output 1900 ml  Net -1417 ml   Filed Weights   01/01/20 0619 01/02/20 0300 01/03/20 0537  Weight: 42.9 kg 43.3 kg 43 kg    Physical Exam   General appearance: alert, pale and anxious Neck: no carotid bruit, no JVD and thyroid not enlarged, symmetric, no tenderness/mass/nodules Lungs: clear to auscultation  bilaterally Heart: regular rate and rhythm Abdomen: scaphoid, soft, non-tender Extremities: extremities normal, atraumatic, no cyanosis or edema Pulses: 2+ and symmetric Skin: Skin color, texture, turgor normal. No rashes or lesions Neurologic: Grossly normal Psych: Pleasant  Inpatient Medications    Scheduled Meds: . busPIRone  30 mg Oral BID  . calcium carbonate  500 mg of elemental calcium Oral Q breakfast  . enoxaparin (LOVENOX) injection  40 mg Subcutaneous Q24H  . feeding supplement (ENSURE ENLIVE)  237 mL Oral BID BM  . furosemide  40 mg Intravenous BID  . hydrALAZINE  25 mg Oral Q8H  . hydroxychloroquine  200 mg Oral Daily  . melatonin  3 mg Oral QHS  . [START ON 01/04/2020] methylPREDNISolone (SOLU-MEDROL) injection  40 mg Intravenous Daily  . multivitamin with minerals  1 tablet Oral Daily  . nicotine  7 mg Transdermal Daily  . sertraline  50 mg Oral Daily  . sodium chloride flush  3 mL Intravenous Q12H  . sodium chloride flush  3 mL Intravenous Q12H    Continuous Infusions: . sodium chloride      PRN Meds: sodium chloride, acetaminophen, ALPRAZolam, levalbuterol, metoprolol tartrate, sodium chloride flush   Labs   Results for orders placed or performed during the hospital encounter of 12/31/19 (from the past 48 hour(s))  Basic metabolic panel     Status: Abnormal   Collection Time: 01/02/20  3:17 AM  Result Value Ref Range  Sodium 128 (L) 135 - 145 mmol/L   Potassium 4.4 3.5 - 5.1 mmol/L   Chloride 96 (L) 98 - 111 mmol/L   CO2 23 22 - 32 mmol/L   Glucose, Bld 160 (H) 70 - 99 mg/dL    Comment: Glucose reference range applies only to samples taken after fasting for at least 8 hours.   BUN 46 (H) 8 - 23 mg/dL   Creatinine, Ser 1.49 (H) 0.44 - 1.00 mg/dL   Calcium 8.5 (L) 8.9 - 10.3 mg/dL   GFR calc non Af Amer 34 (L) >60 mL/min   GFR calc Af Amer 40 (L) >60 mL/min   Anion gap 9 5 - 15    Comment: Performed at [facility] [address],  [place], [place] [id number]  Basic metabolic panel     Status: Abnormal   Collection Time: 01/03/20  3:45 AM  Result Value Ref Range   Sodium 126 (L) 135 - 145 mmol/L   Potassium 4.2 3.5 - 5.1 mmol/L   Chloride 93 (L) 98 - 111 mmol/L   CO2 21 (L) 22 - 32 mmol/L   Glucose, Bld 129 (H) 70 - 99 mg/dL    Comment: Glucose reference range applies only to samples taken after fasting for at least 8 hours.   BUN 44 (H) 8 - 23 mg/dL   Creatinine, Ser 1.17 (H) 0.44 - 1.00 mg/dL   Calcium 8.6 (L) 8.9 - 10.3 mg/dL   GFR calc non Af Amer 46 (L) >60 mL/min   GFR calc Af Amer 53 (L) >60 mL/min   Anion gap 12 5 - 15    Comment: Performed at [facility] [address], [place], [place] [address]  Hemoglobin and hematocrit, blood     Status: Abnormal   Collection Time: 01/03/20  3:45 AM  Result Value Ref Range   Hemoglobin 11.0 (L) 12.0 - 15.0 g/dL   HCT 30.2 (L) 36.0 - 46.0 %    Comment: Performed at [facility], [place] [address] [place], [place] [address]    ECG   N/A  Telemetry   Sinus rhythm in the 80s - Personally Reviewed  Radiology    CARDIAC CATHETERIZATION  Addendum Date: 01/03/2020    Heavy left main and proximal LAD calcification.  Moderate to moderately severe proximal LAD diagonal bifurcation stenosis up to 70%.  Widely patent circumflex.  Widely patent right coronary.  Mid anterior wall hypokinesis greater than other walls related to ischemia from LAD versus dyssynchrony associated with left bundle branch block.  Estimated LVEF is 35 to 40%.  LVEDP is 25 mmHg.  Significant mitral regurgitation by 2D Doppler echocardiogram.  Severe hypertension noted during the procedure. RECOMMENDATIONS:  Medical therapy of heart failure including additional diuresis.  Preventive therapy including high intensity statin therapy for LAD disease.  Downstream management could include PCI with debulking using atherectomy versus combined coronary artery bypass grafting and mitral valve  repair depending upon the status of the mitral valve and role in development of acute pulmonary edema.  Once stable, may need transesophageal echocardiography.  Further discussion concerning severity of mitral valve disease on appropriate management.  Reassess kidney function in a.m.  In lab hypertension treated with IV hydralazine.  Aggressive management of hypertension is required and may have been the driving precipitant for acute heart failure.  Result Date: 01/03/2020  Heavy left main and proximal LAD calcification.  Moderate to moderately severe proximal LAD diagonal bifurcation stenosis up to  70%.  Widely patent circumflex.  Widely patent right coronary.  Mid anterior wall hypokinesis greater than other walls related to ischemia from LAD versus dyssynchrony associated with left bundle branch block.  Estimated LVEF is 35 to 40%.  LVEDP is 25 mmHg.  Significant mitral regurgitation by 2D Doppler echocardiogram. RECOMMENDATIONS:  Medical therapy of heart failure including additional diuresis.  Preventive therapy including high intensity statin therapy for LAD disease.  Downstream management could include PCI with debulking using atherectomy versus combined coronary artery bypass grafting and mitral valve repair depending upon the status of the mitral valve and role in development of acute pulmonary edema.  Once stable, may need transesophageal echocardiography.  Further discussion concerning severity of mitral valve disease on appropriate management.  Reassess kidney function in a.m.  ECHOCARDIOGRAM COMPLETE  Result Date: 01/01/2020    ECHOCARDIOGRAM REPORT   Patient Name:   Loana DRAPER Thaw Date of Exam: 01/01/2020 Medical Rec #:  [id number]          Height:       64.0 in Accession #:    [id number]         Weight:       94.6 lb Date of Birth:  15-Oct-1945          BSA:          1.423 m Patient Age:    23 years           BP:           140/73 mmHg Patient Gender: F                  HR:            72 bpm. Exam Location:  Inpatient Procedure: 2D Echo, Cardiac Doppler and Color Doppler Indications:    CHF-Acute Diastolic  History:        Patient has prior history of Echocardiogram examinations, most                 recent 09/24/2017. COPD, Arrythmias:LBBB,                 Signs/Symptoms:Shortness of Breath; Risk Factors:Current Smoker,                 Hypertension and Dyslipidemia.  Sonographer:    Clayton Lefort RDCS (AE) Referring Phys: [id number] RONDELL A SMITH IMPRESSIONS  1. Moderate to severe global reduction in LV systolic function; grade 1 diastolic dysfunction; moderate LVH; moderate MR; severe LAE.  2. Left ventricular ejection fraction, by estimation, is 30 to 35%. The left ventricle has moderately decreased function. The left ventricle demonstrates global hypokinesis. There is moderate left ventricular hypertrophy. Left ventricular diastolic parameters are consistent with Grade I diastolic dysfunction (impaired relaxation).  3. Right ventricular systolic function is normal. The right ventricular size is normal.  4. Left atrial size was severely dilated.  5. The mitral valve is normal in structure. Moderate mitral valve regurgitation. No evidence of mitral stenosis.  6. The aortic valve is tricuspid. Aortic valve regurgitation is not visualized. Mild aortic valve sclerosis is present, with no evidence of aortic valve stenosis.  7. The inferior vena cava is normal in size with greater than 50% respiratory variability, suggesting right atrial pressure of 3 mmHg. FINDINGS  Left Ventricle: Left ventricular ejection fraction, by estimation, is 30 to 35%. The left ventricle has moderately decreased function. The left ventricle demonstrates global hypokinesis. The left ventricular internal cavity size was normal in size. There is  moderate left ventricular hypertrophy. Left ventricular diastolic parameters are consistent with Grade I diastolic dysfunction (impaired relaxation). Right Ventricle: The right  ventricular size is normal. Right ventricular systolic function is normal. There is normal pulmonary artery systolic pressure. The tricuspid regurgitant velocity is 2.57 m/s, and with an assumed right atrial pressure of 3 mmHg,  the estimated right ventricular systolic pressure is [id number]. Left Atrium: Left atrial size was severely dilated. Right Atrium: Right atrial size was normal in size. Pericardium: There is no evidence of pericardial effusion. Mitral Valve: The mitral valve is normal in structure. Normal mobility of the mitral valve leaflets. Moderate mitral valve regurgitation. No evidence of mitral valve stenosis. MV peak gradient, 5.8 mmHg. The mean mitral valve gradient is 2.0 mmHg. Tricuspid Valve: The tricuspid valve is normal in structure. Tricuspid valve regurgitation is mild . No evidence of tricuspid stenosis. Aortic Valve: The aortic valve is tricuspid. Aortic valve regurgitation is not visualized. Mild aortic valve sclerosis is present, with no evidence of aortic valve stenosis. Aortic valve mean gradient measures 3.0 mmHg. Aortic valve peak gradient measures 4.3 mmHg. Aortic valve area, by VTI measures 2.13 cm. Pulmonic Valve: The pulmonic valve was not well visualized. Pulmonic valve regurgitation is mild. No evidence of pulmonic stenosis. Aorta: The aortic root is normal in size and structure. Venous: The inferior vena cava is normal in size with greater than 50% respiratory variability, suggesting right atrial pressure of 3 mmHg. IAS/Shunts: No atrial level shunt detected by color flow Doppler. Additional Comments: Moderate to severe global reduction in LV systolic function; grade 1 diastolic dysfunction; moderate LVH; moderate MR; severe LAE.  LEFT VENTRICLE PLAX 2D LVIDd:         4.70 cm LVIDs:         3.80 cm LV PW:         1.40 cm LV IVS:        1.30 cm LVOT diam:     1.70 cm LV SV:         50 LV SV Index:   35 LVOT Area:     2.27 cm  LV Volumes (MOD) LV vol d, MOD A2C: 84.5 ml LV vol  d, MOD A4C: 99.0 ml LV vol s, MOD A2C: 57.8 ml LV vol s, MOD A4C: 66.8 ml LV SV MOD A2C:     26.7 ml LV SV MOD A4C:     99.0 ml LV SV MOD BP:      29.8 ml RIGHT VENTRICLE            IVC RV Basal diam:  2.50 cm    IVC diam: 2.00 cm RV S prime:     9.36 cm/s TAPSE (M-mode): 2.2 cm LEFT ATRIUM             Index       RIGHT ATRIUM           Index LA diam:        4.20 cm 2.95 cm/m  RA Area:     12.70 cm LA Vol (A2C):   70.1 ml 49.26 ml/m RA Volume:   26.60 ml  18.69 ml/m LA Vol (A4C):   90.6 ml 63.67 ml/m LA Biplane Vol: 82.6 ml 58.04 ml/m  AORTIC VALVE AV Area (Vmax):    2.12 cm AV Area (Vmean):   1.88 cm AV Area (VTI):     2.13 cm AV Vmax:           104.00 cm/s AV Vmean:  79.600 cm/s AV VTI:            0.237 m AV Peak Grad:      4.3 mmHg AV Mean Grad:      3.0 mmHg LVOT Vmax:         97.30 cm/s LVOT Vmean:        66.100 cm/s LVOT VTI:          0.222 m LVOT/AV VTI ratio: 0.94  AORTA Ao Root diam: 3.00 cm Ao Asc diam:  3.00 cm MITRAL VALVE                 TRICUSPID VALVE MV Area (PHT): 4.10 cm      TR Peak grad:   26.4 mmHg MV Peak grad:  5.8 mmHg      TR Vmax:        257.00 cm/s MV Mean grad:  2.0 mmHg MV Vmax:       1.20 m/s      SHUNTS MV Vmean:      70.3 cm/s     Systemic VTI:  0.22 m MV Decel Time: 185 msec      Systemic Diam: 1.70 cm MR Peak grad:    109.4 mmHg MR Mean grad:    64.0 mmHg MR Vmax:         523.00 cm/s MR Vmean:        369.0 cm/s MR PISA:         3.08 cm MR PISA Eff ROA: 18 mm MR PISA Radius:  0.70 cm MV E velocity: 101.00 cm/s MV A velocity: 118.00 cm/s MV E/A ratio:  0.86 Kirk Ruths MD Electronically signed by Kirk Ruths MD Signature Date/Time: 01/01/2020/3:56:00 PM    Final     Cardiac Studies   See cath results  Assessment   1. Principal Problem: 2.   Acute on chronic combined systolic and diastolic CHF (congestive heart failure) ([place]) 3. Active Problems: 4.   LBBB (left bundle branch block) 5.   Anxiety 6.   Depression 7.   Rheumatoid arthritis ([place]) 8.    Chronic bronchitis ([place]) 9.   Tobacco abuse 10.   Acute urinary retention 11.   Acute respiratory failure with hypoxia ([facility]) 12.   COPD with acute exacerbation ([place]) 13.   Elevated troponin 14.   Acute pulmonary edema ([facility]) 15.   Plan   1. Based on cath results, will need further heart failure optimization. Increase lasix to BID. Continue hydralazine, may need to further increase. Would start coreg 3.125 mg BID tonight for CHF and additional BP control.   Time Spent Directly with Patient:  I have spent a total of 25 minutes with the patient reviewing hospital notes, telemetry, EKGs, labs and examining the patient as well as establishing an assessment and plan that was discussed personally with the patient.  > 50% of time was spent in direct patient care.  Length of Stay:  LOS: 3 days   Pixie Casino, MD, [facility], [place] Director of the [facility] &  Cardiovascular [facility] Diplomate of the American Board of Clinical Lipidology Attending Cardiologist  Direct Dial: [phone]  Fax: [phone]  Website:  [email or web address] Osgood Ariyana Faw 01/03/2020, 10:39 AM

## 2020-01-04 DIAGNOSIS — R778 Other specified abnormalities of plasma proteins: Secondary | ICD-10-CM | POA: Diagnosis not present

## 2020-01-04 DIAGNOSIS — I447 Left bundle-branch block, unspecified: Secondary | ICD-10-CM | POA: Diagnosis not present

## 2020-01-04 DIAGNOSIS — I5043 Acute on chronic combined systolic (congestive) and diastolic (congestive) heart failure: Secondary | ICD-10-CM | POA: Diagnosis not present

## 2020-01-04 LAB — BASIC METABOLIC PANEL
Anion gap: 12 (ref 5–15)
BUN: 44 mg/dL — ABNORMAL HIGH (ref 8–23)
CO2: 21 mmol/L — ABNORMAL LOW (ref 22–32)
Calcium: 8.2 mg/dL — ABNORMAL LOW (ref 8.9–10.3)
Chloride: 92 mmol/L — ABNORMAL LOW (ref 98–111)
Creatinine, Ser: 1.22 mg/dL — ABNORMAL HIGH (ref 0.44–1.00)
GFR calc Af Amer: 51 mL/min — ABNORMAL LOW (ref >60)
GFR calc non Af Amer: 44 mL/min — ABNORMAL LOW (ref >60)
Glucose, Bld: 91 mg/dL (ref 70–99)
Potassium: 4 mmol/L (ref 3.5–5.1)
Sodium: 125 mmol/L — ABNORMAL LOW (ref 135–145)

## 2020-01-04 LAB — CBC
HCT: 31.8 % — ABNORMAL LOW (ref 36.0–46.0)
Hemoglobin: 11.2 g/dL — ABNORMAL LOW (ref 12.0–15.0)
MCH: 35.3 pg — ABNORMAL HIGH (ref 26.0–34.0)
MCHC: 35.2 g/dL (ref 30.0–36.0)
MCV: 100.3 fL — ABNORMAL HIGH (ref 80.0–100.0)
Platelets: 219 10*3/uL (ref 150–400)
RBC: 3.17 MIL/uL — ABNORMAL LOW (ref 3.87–5.11)
RDW: 14.1 % (ref 11.5–15.5)
WBC: 7.9 10*3/uL (ref 4.0–10.5)
nRBC: 0 % (ref 0.0–0.2)

## 2020-01-04 MED ORDER — ALPRAZOLAM 0.5 MG PO TABS
0.5000 mg | ORAL_TABLET | Freq: Three times a day (TID) | ORAL | Status: DC | PRN
  Administered 2020-01-04 – 2020-01-06 (×7): 0.5 mg via ORAL
  Filled 2020-01-04 (×7): qty 1

## 2020-01-04 MED ORDER — RIVAROXABAN 15 MG PO TABS
15.0000 mg | ORAL_TABLET | Freq: Every day | ORAL | Status: DC
  Administered 2020-01-04 – 2020-01-05 (×2): 15 mg via ORAL
  Filled 2020-01-04 (×2): qty 1

## 2020-01-04 MED ORDER — SPIRONOLACTONE 12.5 MG HALF TABLET
12.5000 mg | ORAL_TABLET | Freq: Every day | ORAL | Status: DC
  Administered 2020-01-04 – 2020-01-06 (×3): 12.5 mg via ORAL
  Filled 2020-01-04 (×3): qty 1

## 2020-01-04 MED ORDER — PREDNISONE 20 MG PO TABS
20.0000 mg | ORAL_TABLET | Freq: Every day | ORAL | Status: DC
  Administered 2020-01-05 – 2020-01-06 (×2): 20 mg via ORAL
  Filled 2020-01-04 (×2): qty 1

## 2020-01-04 MED ORDER — CARVEDILOL 6.25 MG PO TABS
6.2500 mg | ORAL_TABLET | Freq: Two times a day (BID) | ORAL | Status: DC
  Administered 2020-01-04: 16:00:00 6.25 mg via ORAL
  Filled 2020-01-04 (×2): qty 1

## 2020-01-04 NOTE — Progress Notes (Signed)
Physical Therapy Treatment Patient Details Name: Regina Ortiz MRN: [id number] DOB: [birth year] Today's Date: 01/04/2020    History of Present Illness 74 yo admitted with acute respiratory failure with tachypnea, Afib and acute CHF exacerbation. Cardiac cath 5/4 and will be treated medically. PMhx: HTN, COPD, LBBB, hyponatremia, RA, Raynaud's, cauda equina syndrome with neurogenic bladder, anxiety/depression    PT Comments    Pt in bed upon arrival of PT, agreeable to PT session with focus on progressing endurance and functional strengthening. The pt was able to demo good stability with ambulation in the hallway, but required seated rest after 75 ft (ambulated 200 ft yesterday). The pt did complete multiple bouts of ambulation through the session, but reports significant fatigue that is limiting her from continued ambulation. The pt was instructed in LE exercises she can complete in the chair for general LE strengthening, and educated on importance of use of breathing techniques to reduce anxiety and improve tolerance for mobility. VSS during ambulation today. The pt will continue to benefit from skilled PT to further progress functional strength and endurance to improve safety and independence with mobility.    Follow Up Recommendations  Home health PT     Equipment Recommendations  None recommended by PT    Recommendations for Other Services       Precautions / Restrictions Precautions Precautions: Fall Restrictions Weight Bearing Restrictions: No    Mobility  Bed Mobility Overal bed mobility: Needs Assistance Bed Mobility: Supine to Sit     Supine to sit: Supervision;HOB elevated     General bed mobility comments: supervision for lines. Incr time  Transfers Overall transfer level: Needs assistance Equipment used: 1 person hand held assist Transfers: Sit to/from Stand Sit to Stand: Min guard         General transfer comment: minG for safety, PT managed lines, pt  completed x4 through session from bed, recliner, and commode. VC for hand positioning  Ambulation/Gait Ambulation/Gait assistance: Supervision Gait Distance (Feet): 75 Feet(75 ft x 2 then 25 ft in room) Assistive device: Rolling walker (2 wheeled) Gait Pattern/deviations: Step-through pattern;Decreased stride length;Trunk flexed Gait velocity: 0.36 m/s Gait velocity interpretation: <1.31 ft/sec, indicative of household ambulator General Gait Details: minG for safety, pt with increased anxiety due to fatigue and fear of falling, able to ambulate 75 ft x2 with chair follow and 2-3 min seated rest. VSS   Stairs             Wheelchair Mobility    Modified Rankin (Stroke Patients Only)       Balance Overall balance assessment: Needs assistance   Sitting balance-Leahy Scale: Good Sitting balance - Comments: supervision, but pt anxious about falling   Standing balance support: No upper extremity supported;During functional activity Standing balance-Leahy Scale: Fair Standing balance comment: pt can stand without assist, bil UE support for gait                            Cognition Arousal/Alertness: Awake/alert Behavior During Therapy: WFL for tasks assessed/performed Overall Cognitive Status: Within Functional Limits for tasks assessed                                        Exercises General Exercises - Lower Extremity Long Arc Quad: AROM;Strengthening;Both;15 reps;Seated(5 reps AROM, then 10 with graded manual resistance to knee ext adn flexion)  General Comments General comments (skin integrity, edema, etc.): VSS, max of 116 following ambulation      Pertinent Vitals/Pain Pain Assessment: No/denies pain    Home Living                      Prior Function            PT Goals (current goals can now be found in the care plan section) Acute Rehab PT Goals Patient Stated Goal: return home PT Goal Formulation: With  patient Time For Goal Achievement: 01/15/20 Potential to Achieve Goals: Good Progress towards PT goals: Progressing toward goals    Frequency    Min 3X/week      PT Plan Current plan remains appropriate    Co-evaluation              AM-PAC PT "6 Clicks" Mobility   Outcome Measure  Help needed turning from your back to your side while in a flat bed without using bedrails?: None Help needed moving from lying on your back to sitting on the side of a flat bed without using bedrails?: A Little Help needed moving to and from a bed to a chair (including a wheelchair)?: A Little Help needed standing up from a chair using your arms (e.g., wheelchair or bedside chair)?: A Little Help needed to walk in hospital room?: A Little Help needed climbing 3-5 steps with a railing? : A Little 6 Click Score: 19    End of Session Equipment Utilized During Treatment: Gait belt Activity Tolerance: Patient tolerated treatment well;Patient limited by fatigue Patient left: in chair;with call bell/phone within reach Nurse Communication: Mobility status PT Visit Diagnosis: Other abnormalities of gait and mobility (R26.89);Difficulty in walking, not elsewhere classified (R26.2);Muscle weakness (generalized) (M62.81)     Time: [id number] PT Time Calculation (min) (ACUTE ONLY): 28 min  Charges:  $Gait Training: 8-22 mins $Therapeutic Activity: 8-22 mins                     Karma Ganja, PT, DPT   Acute Rehabilitation Department Pager #: [phone]   Otho Bellows 01/04/2020, 4:21 PM

## 2020-01-04 NOTE — Discharge Instructions (Signed)
Information on my medicine - XARELTO (Rivaroxaban)  This medication education was reviewed with me or my healthcare representative as part of my discharge preparation.   Why was Xarelto prescribed for you? Xarelto was prescribed for you to reduce the risk of a blood clot forming that can cause a stroke if you have a medical condition called atrial fibrillation (a type of irregular heartbeat).  What do you need to know about xarelto ? Take your Xarelto ONCE DAILY at the same time every day with your evening meal. If you have difficulty swallowing the tablet whole, you may crush it and mix in applesauce just prior to taking your dose.  Take Xarelto exactly as prescribed by your doctor and DO NOT stop taking Xarelto without talking to the doctor who prescribed the medication.  Stopping without other stroke prevention medication to take the place of Xarelto may increase your risk of developing a clot that causes a stroke.  Refill your prescription before you run out.  After discharge, you should have regular check-up appointments with your healthcare provider that is prescribing your Xarelto.  In the future your dose may need to be changed if your kidney function or weight changes by a significant amount.  What do you do if you miss a dose? If you are taking Xarelto ONCE DAILY and you miss a dose, take it as soon as you remember on the same day then continue your regularly scheduled once daily regimen the next day. Do not take two doses of Xarelto at the same time or on the same day.   Important Safety Information A possible side effect of Xarelto is bleeding. You should call your healthcare provider right away if you experience any of the following: ? Bleeding from an injury or your nose that does not stop. ? Unusual colored urine (red or dark brown) or unusual colored stools (red or black). ? Unusual bruising for unknown reasons. ? A serious fall or if you hit your head (even if  there is no bleeding).  Some medicines may interact with Xarelto and might increase your risk of bleeding while on Xarelto. To help avoid this, consult your healthcare provider or pharmacist prior to using any new prescription or non-prescription medications, including herbals, vitamins, non-steroidal anti-inflammatory drugs (NSAIDs) and supplements.  This website has more information on Xarelto: [email or web address]  =======================================================  Atrial Fibrillation    Atrial fibrillation is a type of heartbeat that is irregular or fast. If you have this condition, your heart beats without any order. This makes it hard for your heart to pump blood in a normal way. Atrial fibrillation may come and go, or it may become a long-lasting problem. If this condition is not treated, it can put you at higher risk for stroke, heart failure, and other heart problems.  What are the causes? This condition may be caused by diseases that damage the heart. They include:  High blood pressure.  Heart failure.  Heart valve disease.  Heart surgery. Other causes include:  Diabetes.  Thyroid disease.  Being overweight.  Kidney disease. Sometimes the cause is not known.  What increases the risk? You are more likely to develop this condition if:  You are older.  You smoke.  You exercise often and very hard.  You have a family history of this condition.  You are a man.  You use drugs.  You drink a lot of alcohol.  You have lung conditions, such as emphysema, pneumonia, or COPD.  You have  sleep apnea.   What are the signs or symptoms? Common symptoms of this condition include:  A feeling that your heart is beating very fast.  Chest pain or discomfort.  Feeling short of breath.  Suddenly feeling light-headed or weak.  Getting tired easily during activity.  Fainting.  Sweating. In some cases, there are no symptoms.  How is this  treated? Treatment for this condition depends on underlying conditions and how you feel when you have atrial fibrillation. They include: 1. Medicines to: ? Prevent blood clots. ? Treat heart rate or heart rhythm problems. 2. Using devices, such as a pacemaker, to correct heart rhythm problems. 3. Doing surgery to remove the part of the heart that sends bad signals. 4. Closing an area where clots can form in the heart (left atrial appendage). In some cases, your doctor will treat other underlying conditions.  Follow these instructions at home:  Medicines 1. Take over-the-counter and prescription medicines only as told by your doctor. 2. Do not take any new medicines without first talking to your doctor. 3. If you are taking blood thinners: ? Talk with your doctor before you take any medicines that have aspirin or NSAIDs, such as ibuprofen, in them. ? Take your medicine exactly as told by your doctor. Take it at the same time each day. ? Avoid activities that could hurt or bruise you. Follow instructions about how to prevent falls. ? Wear a bracelet that says you are taking blood thinners. Or, carry a card that lists what medicines you take. Lifestyle          Do not use any products that have nicotine or tobacco in them. These include cigarettes, e-cigarettes, and chewing tobacco. If you need help quitting, ask your doctor.  Eat heart-healthy foods. Talk with your doctor about the right eating plan for you.  Exercise regularly as told by your doctor.  Do not drink alcohol.  Lose weight if you are overweight.  Do not use drugs, including cannabis.  General instructions  If you have a condition that causes breathing to stop for a short period of time (apnea), treat it as told by your doctor.  Keep a healthy weight. Do not use diet pills unless your doctor says they are safe for you. Diet pills may make heart problems worse.  Keep all follow-up visits as told by your doctor.  This is important.  Contact a doctor if:  You notice a change in the speed, rhythm, or strength of your heartbeat.  You are taking a blood-thinning medicine and you get more bruising.  You get tired more easily when you move or exercise.  You have a sudden change in weight.  Get help right away if:    1. You have pain in your chest or your belly (abdomen). 2. You have trouble breathing. 3. You have side effects of blood thinners, such as blood in your vomit, poop (stool), or pee (urine), or bleeding that cannot stop. 4. You have any signs of a stroke. "BE FAST" is an easy way to remember the main warning signs: ? B - Balance. Signs are dizziness, sudden trouble walking, or loss of balance. ? E - Eyes. Signs are trouble seeing or a change in how you see. ? F - Face. Signs are sudden weakness or loss of feeling in the face, or the face or eyelid drooping on one side. ? A - Arms. Signs are weakness or loss of feeling in an arm. This happens suddenly  and usually on one side of the body. ? S - Speech. Signs are sudden trouble speaking, slurred speech, or trouble understanding what people say. ? T - Time. Time to call emergency services. Write down what time symptoms started. 5. You have other signs of a stroke, such as: ? A sudden, very bad headache with no known cause. ? Feeling like you may vomit (nausea). ? Vomiting. ? A seizure.  These symptoms may be an emergency. Do not wait to see if the symptoms will go away. Get medical help right away. Call your local emergency services (911 in the [place] Do not drive yourself to the hospital. Summary  Atrial fibrillation is a type of heartbeat that is irregular or fast.  You are at higher risk of this condition if you smoke, are older, have diabetes, or are overweight.  Follow your doctor's instructions about medicines, diet, exercise, and follow-up visits.  Get help right away if you have signs or symptoms of a stroke.  Get help right  away if you cannot catch your breath, or you have chest pain or discomfort. This information is not intended to replace advice given to you by your health care provider. Make sure you discuss any questions you have with your health care provider. Document Revised: 02/09/2019 Document Reviewed: 02/09/2019 Elsevier Patient Education  [place] [place].

## 2020-01-04 NOTE — Progress Notes (Signed)
DAILY PROGRESS NOTE   Patient Name: Regina Ortiz Date of Encounter: 01/04/2020 Cardiologist: Candee Furbish, MD  Chief Complaint    Anxious about health.  Patient Profile   Regina Ortiz is a 74 y.o. female with a hx of COPD, chronic left bundle branch block, anxiety/PTSD, rheumatoid arthritis, Raynaud's disease, cauda equina s/p surgical decompression 12/2018, tobacco abuse, urinary retension and HTN who is being seen today for the evaluation of reduced EF at the request of Dr. Ree Kida.   Low risk stress test January 2019.  Echocardiogram in 2019 showed normal LV function with grade 1 diastolic dysfunction.  Last seen by Dr. Marlou Porch virtually 12/2018.  Ongoing chest discomfort with multiple risk factor.  Recommended coronary CT with FFR but never completed.  Subjective   Cath films personally reviewed as well as right heart hemodynamics- mild to moderate reduction in CO, pulmonary venous hypertension with elevated LVEDP. Creatinine improved - will increase lasix back to 40 mg IV BID. There is probably a significant LAD lesion, however, sounds difficult to intervene upon therefore will optimize medical therapy. May need to consider TEE evaluation of MR if it remains significant after diuresis.  05/05: having diarrhea, was having constipation, this is a chronic problem for her. Breathing ok Still feels very anxious No chest pain  Objective   Vitals:   01/04/20 0348 01/04/20 0350 01/04/20 0819 01/04/20 0832  BP: (!) 156/83 (!) 156/83 (!) 167/78   Pulse: 73  (!) 101 89  Resp: 14 15 14    Temp: 98.1 F (36.7 C)  97.8 F (36.6 C)   TempSrc: Oral  Oral   SpO2: 99% 100% 100%   Weight:  43.9 kg    Height:        Intake/Output Summary (Last 24 hours) at 01/04/2020 [id number] Last data filed at 01/04/2020 [id number] Gross per 24 hour  Intake 801.52 ml  Output 100 ml  Net 701.52 ml   Filed Weights   01/02/20 0300 01/03/20 0537 01/04/20 0350  Weight: 43.3 kg 43 kg 43.9 kg     Physical Exam   General appearance: alert and pale Neck: no adenopathy, no carotid bruit, no JVD, supple, symmetrical, trachea midline and thyroid not enlarged, symmetric, no tenderness/mass/nodules Lungs: rales bibasilar Heart: soft systolic murmur, LLSB Abdomen: soft, non-tender; bowel sounds normal; no masses,  no organomegaly Extremities: extremities normal, atraumatic, no cyanosis or edema Pulses: 2+ and symmetric Skin: Skin color, texture, turgor normal. No rashes or lesions Neurologic: Grossly normal Anxious affect  Inpatient Medications    Scheduled Meds: . aspirin  81 mg Oral Daily  . atorvastatin  80 mg Oral Daily  . busPIRone  30 mg Oral BID  . calcium carbonate  500 mg of elemental calcium Oral Q breakfast  . carvedilol  3.125 mg Oral BID WC  . feeding supplement (ENSURE ENLIVE)  237 mL Oral BID BM  . furosemide  40 mg Intravenous BID  . hydrALAZINE  25 mg Oral Q8H  . hydroxychloroquine  200 mg Oral Daily  . melatonin  3 mg Oral QHS  . methylPREDNISolone (SOLU-MEDROL) injection  40 mg Intravenous Daily  . multivitamin with minerals  1 tablet Oral Daily  . nicotine  7 mg Transdermal Daily  . sertraline  50 mg Oral Daily  . sodium chloride flush  3 mL Intravenous Q12H  . sodium chloride flush  3 mL Intravenous Q12H  . sodium chloride flush  3 mL Intravenous Q12H    Continuous Infusions: . sodium chloride    .  sodium chloride      PRN Meds: sodium chloride, sodium chloride, acetaminophen, ALPRAZolam, levalbuterol, metoprolol tartrate, sodium chloride flush, sodium chloride flush   Labs   Results for orders placed or performed during the hospital encounter of 12/31/19 (from the past 48 hour(s))  Basic metabolic panel     Status: Abnormal   Collection Time: 01/03/20  3:45 AM  Result Value Ref Range   Sodium 126 (L) 135 - 145 mmol/L   Potassium 4.2 3.5 - 5.1 mmol/L   Chloride 93 (L) 98 - 111 mmol/L   CO2 21 (L) 22 - 32 mmol/L   Glucose, Bld 129 (H) 70  - 99 mg/dL    Comment: Glucose reference range applies only to samples taken after fasting for at least 8 hours.   BUN 44 (H) 8 - 23 mg/dL   Creatinine, Ser 1.17 (H) 0.44 - 1.00 mg/dL   Calcium 8.6 (L) 8.9 - 10.3 mg/dL   GFR calc non Af Amer 46 (L) >60 mL/min   GFR calc Af Amer 53 (L) >60 mL/min   Anion gap 12 5 - 15    Comment: Performed at [facility] [address] [place], [place] [address]  Hemoglobin and hematocrit, blood     Status: Abnormal   Collection Time: 01/03/20  3:45 AM  Result Value Ref Range   Hemoglobin 11.0 (L) 12.0 - 15.0 g/dL   HCT 30.2 (L) 36.0 - 46.0 %    Comment: Performed at [facility], [place] [address] [place], [place] [address]  POCT I-Stat EG7     Status: Abnormal   Collection Time: 01/03/20  9:03 AM  Result Value Ref Range   pH, Ven 7.419 7.250 - 7.430   pCO2, Ven 38.3 (L) 44.0 - 60.0 mmHg   pO2, Ven 40.0 32.0 - 45.0 mmHg   Bicarbonate 24.8 20.0 - 28.0 mmol/L   TCO2 26 22 - 32 mmol/L   O2 Saturation 76.0 %   Acid-Base Excess 0.0 0.0 - 2.0 mmol/L   Sodium 127 (L) 135 - 145 mmol/L   Potassium 4.4 3.5 - 5.1 mmol/L   Calcium, Ion 1.20 1.15 - 1.40 mmol/L   HCT 30.0 (L) 36.0 - 46.0 %   Hemoglobin 10.2 (L) 12.0 - 15.0 g/dL   Sample type VENOUS   I-STAT 7, (LYTES, BLD GAS, ICA, H+H)     Status: Abnormal   Collection Time: 01/03/20  9:11 AM  Result Value Ref Range   pH, Arterial 7.456 (H) 7.350 - 7.450   pCO2 arterial 34.2 32.0 - 48.0 mmHg   pO2, Arterial 134 (H) 83.0 - 108.0 mmHg   Bicarbonate 24.1 20.0 - 28.0 mmol/L   TCO2 25 22 - 32 mmol/L   O2 Saturation 99.0 %   Acid-Base Excess 0.0 0.0 - 2.0 mmol/L   Sodium 126 (L) 135 - 145 mmol/L   Potassium 4.4 3.5 - 5.1 mmol/L   Calcium, Ion 1.20 1.15 - 1.40 mmol/L   HCT 30.0 (L) 36.0 - 46.0 %   Hemoglobin 10.2 (L) 12.0 - 15.0 g/dL   Sample type ARTERIAL   Basic metabolic panel     Status: Abnormal   Collection Time: 01/04/20  3:41 AM  Result Value Ref Range   Sodium 125 (L) 135 - 145  mmol/L   Potassium 4.0 3.5 - 5.1 mmol/L   Chloride 92 (L) 98 - 111 mmol/L   CO2 21 (L) 22 - 32 mmol/L   Glucose, Bld 91 70 -  99 mg/dL    Comment: Glucose reference range applies only to samples taken after fasting for at least 8 hours.   BUN 44 (H) 8 - 23 mg/dL   Creatinine, Ser 1.22 (H) 0.44 - 1.00 mg/dL   Calcium 8.2 (L) 8.9 - 10.3 mg/dL   GFR calc non Af Amer 44 (L) >60 mL/min   GFR calc Af Amer 51 (L) >60 mL/min   Anion gap 12 5 - 15    Comment: Performed at [facility] [address], [place], [place] [address]  CBC     Status: Abnormal   Collection Time: 01/04/20  3:41 AM  Result Value Ref Range   WBC 7.9 4.0 - 10.5 K/uL   RBC 3.17 (L) 3.87 - 5.11 MIL/uL   Hemoglobin 11.2 (L) 12.0 - 15.0 g/dL   HCT 31.8 (L) 36.0 - 46.0 %   MCV 100.3 (H) 80.0 - 100.0 fL   MCH 35.3 (H) 26.0 - 34.0 pg   MCHC 35.2 30.0 - 36.0 g/dL   RDW 14.1 11.5 - 15.5 %   Platelets 219 150 - 400 K/uL   nRBC 0.0 0.0 - 0.2 %    Comment: Performed at [facility], [place] [address] [place], [place] [address]    ECG   N/A  Telemetry   Sinus rhythm in the 80s - Personally Reviewed  Radiology    CARDIAC CATHETERIZATION  Addendum Date: 01/03/2020    Heavy left main and proximal LAD calcification.  Moderate to moderately severe proximal LAD diagonal bifurcation stenosis up to 70%.  Widely patent circumflex.  Widely patent right coronary.  Mid anterior wall hypokinesis greater than other walls related to ischemia from LAD versus dyssynchrony associated with left bundle branch block.  Estimated LVEF is 35 to 40%.  LVEDP is 25 mmHg.  Significant mitral regurgitation by 2D Doppler echocardiogram.  Severe hypertension noted during the procedure. RECOMMENDATIONS:  Medical therapy of heart failure including additional diuresis.  Preventive therapy including high intensity statin therapy for LAD disease.  Downstream management could include PCI with debulking using atherectomy versus combined  coronary artery bypass grafting and mitral valve repair depending upon the status of the mitral valve and role in development of acute pulmonary edema.  Once stable, may need transesophageal echocardiography.  Further discussion concerning severity of mitral valve disease on appropriate management.  Reassess kidney function in a.m.  In lab hypertension treated with IV hydralazine.  Aggressive management of hypertension is required and may have been the driving precipitant for acute heart failure.  Result Date: 01/03/2020  Heavy left main and proximal LAD calcification.  Moderate to moderately severe proximal LAD diagonal bifurcation stenosis up to 70%.  Widely patent circumflex.  Widely patent right coronary.  Mid anterior wall hypokinesis greater than other walls related to ischemia from LAD versus dyssynchrony associated with left bundle branch block.  Estimated LVEF is 35 to 40%.  LVEDP is 25 mmHg.  Significant mitral regurgitation by 2D Doppler echocardiogram. RECOMMENDATIONS:  Medical therapy of heart failure including additional diuresis.  Preventive therapy including high intensity statin therapy for LAD disease.  Downstream management could include PCI with debulking using atherectomy versus combined coronary artery bypass grafting and mitral valve repair depending upon the status of the mitral valve and role in development of acute pulmonary edema.  Once stable, may need transesophageal echocardiography.  Further discussion concerning severity of mitral valve disease on appropriate management.  Reassess kidney function in a.m.   Cardiac Studies  See cath results  Assessment   Principal Problem:   Acute on chronic combined systolic and diastolic CHF (congestive heart failure) (HCC) Active Problems:   LBBB (left bundle branch block)   Anxiety   Depression   Rheumatoid arthritis (HCC)   Chronic bronchitis (HCC)   Tobacco abuse   Acute urinary retention   Acute respiratory  failure with hypoxia (HCC)   COPD with acute exacerbation (HCC)   Elevated troponin   Acute pulmonary edema ([place])   Plan   1. Cr up slightly and Na is trending down (hyponatremia is chronic problem for her). With diarrhea, I/O are incomplete, may be getting dry. Continue IV Lasix for now, may be able to change to po soon. No ischemic sx, on Coreg, will increase dose since BP/HR still up. MD advise on starting spiro and/or Imdur      Length of Stay:  LOS: 4 days   Wendy Hoback 01/04/2020, 9:22 AM

## 2020-01-04 NOTE — Progress Notes (Signed)
PROGRESS NOTE    Regina Ortiz  [id number] DOB: 12/22/1945 DOA: 12/31/2019 PCP: Gayland Curry, DO    Chief Complaint  Patient presents with  . Chest Pain    Brief Narrative:HPI on 12/31/2019 by Dr. Fuller Plan Tiffanymarie Fredrick Morrisis a 74 y.o.femalewith medical history significant ofhypertension, COPD, chronic hyponatremia, LBBB, rheumatoid arthritis, Raynaud's disease,neurogenic bladder, cauda equina syndromeS/P SURGICALdecompression in 01/28/2019 by Dr. Theophilus Bones, anxiety, depression,tobacco abuse,and GERDpresents with complaints of shortness of breath. She had been up all night as she was sweating profusely and having to change her shirt. Thereafter, developed progressively worsening shortness of breath. Associated symptoms included wheezing, very minimal cough, leg swelling, and later develops substernal chest pressure without radiation. Patient continues to smoke a couple cigarettes throughout the week, but would like to quit. She feels that her anxiety has worsened despite her Zoloft dose being recently increased from 50 mg to 100 mg daily a couple days ago. At baseline patient reports that she is not on oxygen.  En routewith EMS patient was noted to be hypoxic and was initially placed on a nonrebreather. She received 324 mg of aspirin and 2 nitroglycerinprior to arrival.  01/04/2020-patient extremely anxious, reports she had diarrhea starting yesterday, she had been constipated prior to that received stool softeners.  She denies any chest pain at this time.  She is chronically short of breath.  Assessment & Plan:   Principal Problem:   Acute on chronic combined systolic and diastolic CHF (congestive heart failure) (HCC) Active Problems:   LBBB (left bundle branch block)   Anxiety   Depression   Rheumatoid arthritis (HCC)   Chronic bronchitis (HCC)   Tobacco abuse   Acute urinary retention   Acute respiratory failure with hypoxia (HCC)   COPD with  acute exacerbation (HCC)   Elevated troponin   Acute pulmonary edema (HCC)   #1 acute combined diastolic/new systolic heart failure-patient presented with shortness of breath and was found to be very hypoxic placed on nonrebreather at the time of admission.  BNP was 3381.  Chest x-ray showed pulmonary edema.  Echo showed ejection fraction 30 to 35% down from 50 to 55% in 2019.  Continue Lasix 40, Aldactone, Xarelto per cardiology.     Cath 5/5 -LAD diagonal [place] 111 bifurcation stenosis with LAD 50 to 70% and diagonal 60%.  Within the region of heavy calcification.  Left main is widely patent  Circumflex is widely patent  Right coronary is dominant and widely patent Left ventriculogram demonstrates EF 40% with mid anterior wall towards the apex hypokinesis more dyssynchrony related to left bundle branch block  #2 AKI with CKD stage III worse   Creatinine trending up.  Monitor closely on Lasix.  #3 Raynaud's disease/rheumatoid arthritis on Plaquenil and steroids chronically.  #4 status post hypertensive urgency on Coreg now.  Cardizem at home.  Coreg dose increased today 6.25 mg twice a day.  #5 anxiety depression continue BuSpar and Xanax will increase Xanax dose.  #6 urinary retention better  #7 acute on chronic bronchitis continue nebulizer treatments will taper steroids.  #8 chronic hyponatremia-on SSRIs monitor closely  #9 tobacco abuse discussed smoking cessation continue nicotine patch  #10 A. fib/a flutter-continue Coreg, Xarelto 15 mg to be started today per cardiology.   DVT prophylaxis: Xarelto to be started today Code Status: Full code Family Communication: Discussed with patient Disposition: Patient remains inpatient status post cath yesterday.  Remains on IV Lasix and the dose being adjusted.  Medications still being adjusted  and managed.  Followed by cardiology and needs clearance prior to discharge.  Dispo: The patient is from: Home              Anticipated  d/c is to: Home health physical therapy              Anticipated d/c date is: More than 2 days              Patient currently is not medically stable to d/c.    Consultants: Cardiology  Procedures: Cath 01/03/2020 Antimicrobials: None  Subjective: Patient resting in bed very anxious denies any chest pain   Objective: Vitals:   01/04/20 0348 01/04/20 0350 01/04/20 0819 01/04/20 0832  BP: (!) 156/83 (!) 156/83 (!) 167/78   Pulse: 73  (!) 101 89  Resp: 14 15 14    Temp: 98.1 F (36.7 C)  97.8 F (36.6 C)   TempSrc: Oral  Oral   SpO2: 99% 100% 100%   Weight:  43.9 kg    Height:        Intake/Output Summary (Last 24 hours) at 01/04/2020 1112 Last data filed at 01/04/2020 0835 Gross per 24 hour  Intake 801.52 ml  Output 100 ml  Net 701.52 ml   Filed Weights   01/02/20 0300 01/03/20 0537 01/04/20 0350  Weight: 43.3 kg 43 kg 43.9 kg    Examination:  General exam: Appears calm and comfortable  Respiratory system: Diminished breath sounds at the bases to auscultation. Respiratory effort normal. Cardiovascular system: S1 & S2 heard, RRR. No JVD, murmurs, rubs, gallops or clicks. No pedal edema. Gastrointestinal system: Abdomen is nondistended, soft and nontender. No organomegaly or masses felt. Normal bowel sounds heard. Central nervous system: Alert and oriented. No focal neurological deficits. Extremities: No edema Skin: No rashes, lesions or ulcers Psychiatry: Judgement and insight appear normal. Mood & affect appropriate.     Data Reviewed: I have personally reviewed following labs and imaging studies  CBC: Recent Labs  Lab 12/31/19 0432 01/03/20 0345 01/03/20 0903 01/03/20 0911 01/04/20 0341  WBC 15.6*  --   --   --  7.9  NEUTROABS 10.6*  --   --   --   --   HGB 12.2 11.0* 10.2* 10.2* 11.2*  HCT 35.7* 30.2* 30.0* 30.0* 31.8*  MCV 103.5*  --   --   --  100.3*  PLT 267  --   --   --  [id number]    Basic Metabolic Panel: Recent Labs  Lab 12/31/19 0432 12/31/19 0432  12/31/19 0939 01/01/20 0357 01/01/20 0357 01/02/20 0317 01/03/20 0345 01/03/20 0903 01/03/20 0911 01/04/20 0341  NA 132*   < >  --  127*   < > 128* 126* 127* 126* 125*  K 3.8   < >  --  4.9   < > 4.4 4.2 4.4 4.4 4.0  CL 100  --   --  96*  --  96* 93*  --   --  92*  CO2 20*  --   --  22  --  23 21*  --   --  21*  GLUCOSE 169*  --   --  136*  --  160* 129*  --   --  91  BUN 19  --   --  36*  --  46* 44*  --   --  44*  CREATININE 1.08*  --   --  1.38*  --  1.49* 1.17*  --   --  1.22*  CALCIUM 8.6*  --   --  8.2*  --  8.5* 8.6*  --   --  8.2*  MG  --   --  1.8  --   --   --   --   --   --   --    < > = values in this interval not displayed.    GFR: Estimated Creatinine Clearance: 28 mL/min (A) (by C-G formula based on SCr of 1.22 mg/dL (H)).  Liver Function Tests: Recent Labs  Lab 12/31/19 0939  AST 28  ALT 44  ALKPHOS 44  BILITOT 0.7  PROT 6.4*  ALBUMIN 3.2*    CBG: No results for input(s): GLUCAP in the last 168 hours.   Recent Results (from the past 240 hour(s))  Respiratory Panel by RT PCR (Flu A&B, Covid) - Nasopharyngeal Swab     Status: None   Collection Time: 12/31/19  7:15 AM   Specimen: Nasopharyngeal Swab  Result Value Ref Range Status   SARS Coronavirus 2 by RT PCR NEGATIVE NEGATIVE Final    Comment: (NOTE) SARS-CoV-2 target nucleic acids are NOT DETECTED. The SARS-CoV-2 RNA is generally detectable in upper respiratoy specimens during the acute phase of infection. The lowest concentration of SARS-CoV-2 viral copies this assay can detect is 131 copies/mL. A negative result does not preclude SARS-Cov-2 infection and should not be used as the sole basis for treatment or other patient management decisions. A negative result may occur with  improper specimen collection/handling, submission of specimen other than nasopharyngeal swab, presence of viral mutation(s) within the areas targeted by this assay, and inadequate number of viral copies (<131 copies/mL).  A negative result must be combined with clinical observations, patient history, and epidemiological information. The expected result is Negative. Fact Sheet for Patients:  PinkCheek.be Fact Sheet for Healthcare Providers:  GravelBags.it This test is not yet ap proved or cleared by the [place] FDA and  has been authorized for detection and/or diagnosis of SARS-CoV-2 by FDA under an Emergency Use Authorization (EUA). This EUA will remain  in effect (meaning this test can be used) for the duration of the COVID-19 declaration under Section 564(b)(1) of the Act, 21 U.S.C. section 360bbb-3(b)(1), unless the authorization is terminated or revoked sooner.    Influenza A by PCR NEGATIVE NEGATIVE Final   Influenza B by PCR NEGATIVE NEGATIVE Final    Comment: (NOTE) The Xpert Xpress SARS-CoV-2/FLU/RSV assay is intended as an aid in  the diagnosis of influenza from Nasopharyngeal swab specimens and  should not be used as a sole basis for treatment. Nasal washings and  aspirates are unacceptable for Xpert Xpress SARS-CoV-2/FLU/RSV  testing. Fact Sheet for Patients: PinkCheek.be Fact Sheet for Healthcare Providers: GravelBags.it This test is not yet approved or cleared by the [place] FDA and  has been authorized for detection and/or diagnosis of SARS-CoV-2 by  FDA under an Emergency Use Authorization (EUA). This EUA will remain  in effect (meaning this test can be used) for the duration of the  Covid-19 declaration under Section 564(b)(1) of the Act, 21  U.S.C. section 360bbb-3(b)(1), unless the authorization is  terminated or revoked. Performed at [facility], [place] [address] [place], [place] [address]          Radiology Studies: CARDIAC CATHETERIZATION  Addendum Date: 01/03/2020    Heavy left main and proximal LAD calcification.  Moderate to  moderately severe proximal LAD diagonal bifurcation stenosis up to 70%.  Widely patent circumflex.  Widely  patent right coronary.  Mid anterior wall hypokinesis greater than other walls related to ischemia from LAD versus dyssynchrony associated with left bundle branch block.  Estimated LVEF is 35 to 40%.  LVEDP is 25 mmHg.  Significant mitral regurgitation by 2D Doppler echocardiogram.  Severe hypertension noted during the procedure. RECOMMENDATIONS:  Medical therapy of heart failure including additional diuresis.  Preventive therapy including high intensity statin therapy for LAD disease.  Downstream management could include PCI with debulking using atherectomy versus combined coronary artery bypass grafting and mitral valve repair depending upon the status of the mitral valve and role in development of acute pulmonary edema.  Once stable, may need transesophageal echocardiography.  Further discussion concerning severity of mitral valve disease on appropriate management.  Reassess kidney function in a.m.  In lab hypertension treated with IV hydralazine.  Aggressive management of hypertension is required and may have been the driving precipitant for acute heart failure.  Result Date: 01/03/2020  Heavy left main and proximal LAD calcification.  Moderate to moderately severe proximal LAD diagonal bifurcation stenosis up to 70%.  Widely patent circumflex.  Widely patent right coronary.  Mid anterior wall hypokinesis greater than other walls related to ischemia from LAD versus dyssynchrony associated with left bundle branch block.  Estimated LVEF is 35 to 40%.  LVEDP is 25 mmHg.  Significant mitral regurgitation by 2D Doppler echocardiogram. RECOMMENDATIONS:  Medical therapy of heart failure including additional diuresis.  Preventive therapy including high intensity statin therapy for LAD disease.  Downstream management could include PCI with debulking using atherectomy versus combined coronary  artery bypass grafting and mitral valve repair depending upon the status of the mitral valve and role in development of acute pulmonary edema.  Once stable, may need transesophageal echocardiography.  Further discussion concerning severity of mitral valve disease on appropriate management.  Reassess kidney function in a.m.       Scheduled Meds: . aspirin  81 mg Oral Daily  . atorvastatin  80 mg Oral Daily  . busPIRone  30 mg Oral BID  . calcium carbonate  500 mg of elemental calcium Oral Q breakfast  . carvedilol  6.25 mg Oral BID WC  . feeding supplement (ENSURE ENLIVE)  237 mL Oral BID BM  . furosemide  40 mg Intravenous BID  . hydrALAZINE  25 mg Oral Q8H  . hydroxychloroquine  200 mg Oral Daily  . melatonin  3 mg Oral QHS  . methylPREDNISolone (SOLU-MEDROL) injection  40 mg Intravenous Daily  . multivitamin with minerals  1 tablet Oral Daily  . nicotine  7 mg Transdermal Daily  . sertraline  50 mg Oral Daily  . sodium chloride flush  3 mL Intravenous Q12H  . sodium chloride flush  3 mL Intravenous Q12H  . sodium chloride flush  3 mL Intravenous Q12H   Continuous Infusions: . sodium chloride    . sodium chloride       LOS: 4 days     Georgette Shell, MD [facility]   To contact the attending provider between 7A-7P or the covering provider during after hours 7P-7A, please log into the web site [email or web address] and access using universal [place] password for that web site. If you do not have the password, please call the hospital operator.  01/04/2020, 11:12 AM

## 2020-01-05 DIAGNOSIS — I5043 Acute on chronic combined systolic (congestive) and diastolic (congestive) heart failure: Secondary | ICD-10-CM | POA: Diagnosis not present

## 2020-01-05 DIAGNOSIS — I480 Paroxysmal atrial fibrillation: Secondary | ICD-10-CM | POA: Diagnosis not present

## 2020-01-05 DIAGNOSIS — J9601 Acute respiratory failure with hypoxia: Secondary | ICD-10-CM | POA: Diagnosis not present

## 2020-01-05 DIAGNOSIS — I447 Left bundle-branch block, unspecified: Secondary | ICD-10-CM | POA: Diagnosis not present

## 2020-01-05 LAB — BASIC METABOLIC PANEL
Anion gap: 7 (ref 5–15)
BUN: 40 mg/dL — ABNORMAL HIGH (ref 8–23)
CO2: 24 mmol/L (ref 22–32)
Calcium: 7.9 mg/dL — ABNORMAL LOW (ref 8.9–10.3)
Chloride: 93 mmol/L — ABNORMAL LOW (ref 98–111)
Creatinine, Ser: 1.17 mg/dL — ABNORMAL HIGH (ref 0.44–1.00)
GFR calc Af Amer: 53 mL/min — ABNORMAL LOW (ref >60)
GFR calc non Af Amer: 46 mL/min — ABNORMAL LOW (ref >60)
Glucose, Bld: 97 mg/dL (ref 70–99)
Potassium: 3.4 mmol/L — ABNORMAL LOW (ref 3.5–5.1)
Sodium: 124 mmol/L — ABNORMAL LOW (ref 135–145)

## 2020-01-05 LAB — MAGNESIUM: Magnesium: 1.9 mg/dL (ref 1.7–2.4)

## 2020-01-05 MED ORDER — LOPERAMIDE HCL 2 MG PO CAPS
2.0000 mg | ORAL_CAPSULE | Freq: Once | ORAL | Status: AC
  Administered 2020-01-05: 06:00:00 2 mg via ORAL
  Filled 2020-01-05: qty 1

## 2020-01-05 MED ORDER — POTASSIUM CHLORIDE CRYS ER 20 MEQ PO TBCR
40.0000 meq | EXTENDED_RELEASE_TABLET | Freq: Every day | ORAL | Status: DC
  Administered 2020-01-05 – 2020-01-06 (×2): 40 meq via ORAL
  Filled 2020-01-05 (×2): qty 2

## 2020-01-05 MED ORDER — CEPHALEXIN 500 MG PO CAPS
500.0000 mg | ORAL_CAPSULE | Freq: Two times a day (BID) | ORAL | Status: DC
  Administered 2020-01-05 – 2020-01-06 (×2): 500 mg via ORAL
  Filled 2020-01-05 (×2): qty 1

## 2020-01-05 MED ORDER — POTASSIUM CHLORIDE CRYS ER 20 MEQ PO TBCR: 20.0000 meq | EXTENDED_RELEASE_TABLET | Freq: Once | ORAL | Status: DC

## 2020-01-05 MED ORDER — LOPERAMIDE HCL 2 MG PO CAPS
2.0000 mg | ORAL_CAPSULE | Freq: Four times a day (QID) | ORAL | Status: DC | PRN
  Administered 2020-01-05 – 2020-01-06 (×2): 2 mg via ORAL
  Filled 2020-01-05 (×2): qty 1

## 2020-01-05 MED ORDER — SACCHAROMYCES BOULARDII 250 MG PO CAPS
250.0000 mg | ORAL_CAPSULE | Freq: Two times a day (BID) | ORAL | Status: DC
  Administered 2020-01-05 – 2020-01-06 (×2): 250 mg via ORAL
  Filled 2020-01-05 (×2): qty 1

## 2020-01-05 MED ORDER — MAGNESIUM SULFATE 4 GM/100ML IV SOLN
4.0000 g | Freq: Once | INTRAVENOUS | Status: AC
  Administered 2020-01-05: 4 g via INTRAVENOUS
  Filled 2020-01-05: qty 100

## 2020-01-05 MED ORDER — CARVEDILOL 12.5 MG PO TABS
12.5000 mg | ORAL_TABLET | Freq: Two times a day (BID) | ORAL | Status: DC
  Administered 2020-01-05 – 2020-01-06 (×3): 12.5 mg via ORAL
  Filled 2020-01-05 (×2): qty 1

## 2020-01-05 MED ORDER — SERTRALINE HCL 25 MG PO TABS
25.0000 mg | ORAL_TABLET | Freq: Every day | ORAL | Status: DC
  Administered 2020-01-06: 25 mg via ORAL
  Filled 2020-01-05: qty 1

## 2020-01-05 NOTE — Progress Notes (Signed)
PHARMACY NOTE:  ANTIMICROBIAL RENAL DOSAGE ADJUSTMENT  Current antimicrobial regimen includes a mismatch between antimicrobial dosage and estimated renal function.  As per policy approved by the Pharmacy & Therapeutics and Medical Executive Committees, the antimicrobial dosage will be adjusted accordingly.  Current antimicrobial dosage:  Cephalexin 500mg  PO q8h  Renal Function:  Estimated Creatinine Clearance: 28 mL/min (A) (by C-G formula based on SCr of 1.17 mg/dL (H)). []      On intermittent HD, scheduled: []      On CRRT    Antimicrobial dosage has been changed to:  Cephalexin 500mg  PO q12h   Additional comments:   Thaddius Manes A. Levada Dy, PharmD, BCPS, FNKF Clinical Pharmacist [place] Please utilize Amion for appropriate phone number to reach the unit pharmacist ([place])   01/05/2020 2:38 PM

## 2020-01-05 NOTE — Progress Notes (Signed)
PROGRESS NOTE    Regina Ortiz  [id number] DOB: [birth year] DOA: 12/31/2019 PCP: Regina Curry, DO   Chief Complaint  Patient presents with  . Chest Pain    Brief Narrative: HPI on 12/31/2019 by Regina Ortiz Regina Fenoglio Morrisis a 74 y.o.femalewith medical history significant ofhypertension, COPD, chronic hyponatremia, LBBB, rheumatoid arthritis, Raynaud's disease,neurogenic bladder, cauda equina syndromeS/P SURGICALdecompression in 01/28/2019 by Dr. Theophilus Ortiz, anxiety, depression,tobacco abuse,and GERDpresents with complaints of shortness of breath. She had been up all night as she was sweating profusely and having to change her shirt. Thereafter, developed progressively worsening shortness of breath. Associated symptoms included wheezing, very minimal cough, leg swelling, and later develops substernal chest pressure without radiation. Patient continues to smoke a couple cigarettes throughout the week, but would like to quit. She feels that her anxiety has worsened despite her Zoloft dose being recently increased from 50 mg to 100 mg daily a couple days ago. At baseline patient reports that she is not on oxygen.  En routewith EMS patient was noted to be hypoxic and was initially placed on a nonrebreather. She received 324 mg of aspirin and 2 nitroglycerinprior to arrival.  01/04/2020-patient extremely anxious, reports she had diarrhea starting yesterday, she had been constipated prior to that received stool softeners.  She denies any chest pain at this time.  She is chronically short of breath.  5/6-patient extremely anxious, she has a son who lives out of town she lives alone has a caregiver who comes in for few hours, a 76 year old son passed away, husband committed suicide 2 years ago she is extremely anxious and worried about all of this and her own health Patient was constipated she got stool softeners and laxatives and now she has more  diarrhea.  Assessment & Ortiz:   Principal Problem:   Acute on chronic combined systolic and diastolic CHF (congestive heart failure) (HCC) Active Problems:   LBBB (left bundle branch block)   Anxiety   Depression   Rheumatoid arthritis (HCC)   Chronic bronchitis (HCC)   Tobacco abuse   Acute urinary retention   Acute respiratory failure with hypoxia (HCC)   COPD with acute exacerbation (HCC)   Elevated troponin   Acute pulmonary edema (HCC)   #1 acute combined diastolic/new systolic heart failure-patient presented with shortness of breath and was found to be very hypoxic placed on nonrebreather at the time of admission.  BNP was 3381.  Chest x-ray showed pulmonary edema.  Echo showed ejection fraction 30 to 35% down from 50 to 55% in 2019.  Continue Lasix 40, Aldactone, Xarelto per cardiology.     Cath 5/5 -LAD diagonal [place] 111 bifurcation stenosis with LAD 50 to 70% and diagonal 60%. Within the region of heavy calcification.  Left main is widely patent  Circumflex is widely patent  Right coronary is dominant and widely patent Left ventriculogram demonstrates EF 40% with mid anterior wall towards the apex hypokinesis more dyssynchrony related to left bundle branch block  #2 AKI with CKD stage III worse   Creatinine trending up.  Monitor closely on Lasix.  #3 Raynaud's disease/rheumatoid arthritis on Plaquenil and steroids chronically.  #4 status post hypertensive urgency on Coreg now.  Cardizem at home.  Coreg dose increased today 6.25 mg twice a day.  #5 anxiety depression continue BuSpar and Xanax will increase Xanax dose.  #6 urinary retention resolved.  #7 acute on chronic bronchitis continue nebulizer treatments will taper steroids.  #8 chronic hyponatremia-sodium trending down on SSRIs monitor  closely  #9 tobacco abuse discussed smoking cessation continue nicotine patch  #10 A. fib/a flutter-continue Coreg, Xarelto 15 mg to be started today per  cardiology.  #11 diarrhea after she got stool softeners and laxatives.  I will start her on probiotics and as needed Imodium.  #12 infected Bartholin's gland warm soaks to the area.  Keflex for 7 days.   DVT prophylaxis: Xarelto to be started today Code Status: Full code Family Communication: Discussed with patient Disposition: Patient remains inpatient status post cath yesterday.  Remains on IV Lasix and the dose being adjusted.  Medications still being adjusted and managed.  Followed by cardiology and needs clearance prior to discharge.  Dispo: The patient is from: Home  Anticipated d/c is to: Home health physical therapy  Anticipated d/c date is: More than 2 days  Patient currently is not medically stable to d/c.    Consultants: Cardiology  Procedures: Cath 01/03/2020       Antimicrobials: Keflex  Subjective: Resting in bed has a lot of complaints denies chest pain or shortness of breath but anxious and concerned about her health  Objective: Vitals:   01/04/20 2006 01/04/20 2301 01/05/20 0321 01/05/20 0803  BP: (!) 161/84 (!) 165/83 (!) 155/78 (!) 159/64  Pulse: 87 87 88 86  Resp: 20 20 20 20   Temp: 98.1 F (36.7 C) 98.6 F (37 C) 98.5 F (36.9 C) 98.1 F (36.7 C)  TempSrc: Oral Oral Oral Oral  SpO2: 99% 98% 97% (!) 74%  Weight:   42.1 kg   Height:        Intake/Output Summary (Last 24 hours) at 01/05/2020 1426 Last data filed at 01/04/2020 2255 Gross per 24 hour  Intake 120 ml  Output 250 ml  Net -130 ml   Filed Weights   01/03/20 0537 01/04/20 0350 01/05/20 0321  Weight: 43 kg 43.9 kg 42.1 kg    Examination:  General exam: Appears calm and comfortable  Respiratory system: Clear to auscultation. Respiratory effort normal. Cardiovascular system: S1 & S2 heard, RRR. No JVD, murmurs, rubs, gallops or clicks. No pedal edema. Gastrointestinal system: Abdomen is nondistended, soft and nontender. No organomegaly or masses  felt. Normal bowel sounds heard. Hard erythematous area in the right labia majora, tender to touch. Central nervous system: Alert and oriented. No focal neurological deficits. Extremities: Symmetric 5 x 5 power. Skin: No rashes, lesions or ulcers Psychiatry: Judgement and insight appear normal. Mood & affect appropriate.     Data Reviewed: I have personally reviewed following labs and imaging studies  CBC: Recent Labs  Lab 12/31/19 0432 01/03/20 0345 01/03/20 0903 01/03/20 0911 01/04/20 0341  WBC 15.6*  --   --   --  7.9  NEUTROABS 10.6*  --   --   --   --   HGB 12.2 11.0* 10.2* 10.2* 11.2*  HCT 35.7* 30.2* 30.0* 30.0* 31.8*  MCV 103.5*  --   --   --  100.3*  PLT 267  --   --   --  [id number]    Basic Metabolic Panel: Recent Labs  Lab 12/31/19 0432 12/31/19 0939 01/01/20 0357 01/01/20 0357 01/02/20 0317 01/02/20 0317 01/03/20 0345 01/03/20 0903 01/03/20 0911 01/04/20 0341 01/05/20 0248 01/05/20 1025  NA   < >  --  127*   < > 128*   < > 126* 127* 126* 125* 124*  --   K   < >  --  4.9   < > 4.4   < >  4.2 4.4 4.4 4.0 3.4*  --   CL   < >  --  96*  --  96*  --  93*  --   --  92* 93*  --   CO2   < >  --  22  --  23  --  21*  --   --  21* 24  --   GLUCOSE   < >  --  136*  --  160*  --  129*  --   --  91 97  --   BUN   < >  --  36*  --  46*  --  44*  --   --  44* 40*  --   CREATININE   < >  --  1.38*  --  1.49*  --  1.17*  --   --  1.22* 1.17*  --   CALCIUM   < >  --  8.2*  --  8.5*  --  8.6*  --   --  8.2* 7.9*  --   MG  --  1.8  --   --   --   --   --   --   --   --   --  1.9   < > = values in this interval not displayed.    GFR: Estimated Creatinine Clearance: 28 mL/min (A) (by C-G formula based on SCr of 1.17 mg/dL (H)).  Liver Function Tests: Recent Labs  Lab 12/31/19 0939  AST 28  ALT 44  ALKPHOS 44  BILITOT 0.7  PROT 6.4*  ALBUMIN 3.2*    CBG: No results for input(s): GLUCAP in the last 168 hours.   Recent Results (from the past 240 hour(s))   Respiratory Panel by RT PCR (Flu A&B, Covid) - Nasopharyngeal Swab     Status: None   Collection Time: 12/31/19  7:15 AM   Specimen: Nasopharyngeal Swab  Result Value Ref Range Status   SARS Coronavirus 2 by RT PCR NEGATIVE NEGATIVE Final    Comment: (NOTE) SARS-CoV-2 target nucleic acids are NOT DETECTED. The SARS-CoV-2 RNA is generally detectable in upper respiratoy specimens during the acute phase of infection. The lowest concentration of SARS-CoV-2 viral copies this assay can detect is 131 copies/mL. A negative result does not preclude SARS-Cov-2 infection and should not be used as the sole basis for treatment or other patient management decisions. A negative result may occur with  improper specimen collection/handling, submission of specimen other than nasopharyngeal swab, presence of viral mutation(s) within the areas targeted by this assay, and inadequate number of viral copies (<131 copies/mL). A negative result must be combined with clinical observations, patient history, and epidemiological information. The expected result is Negative. Fact Sheet for Patients:  PinkCheek.be Fact Sheet for Healthcare Providers:  GravelBags.it This test is not yet ap proved or cleared by the [place] FDA and  has been authorized for detection and/or diagnosis of SARS-CoV-2 by FDA under an Emergency Use Authorization (EUA). This EUA will remain  in effect (meaning this test can be used) for the duration of the COVID-19 declaration under Section 564(b)(1) of the Act, 21 U.S.C. section 360bbb-3(b)(1), unless the authorization is terminated or revoked sooner.    Influenza A by PCR NEGATIVE NEGATIVE Final   Influenza B by PCR NEGATIVE NEGATIVE Final    Comment: (NOTE) The Xpert Xpress SARS-CoV-2/FLU/RSV assay is intended as an aid in  the diagnosis of influenza from Nasopharyngeal swab specimens and  should not  be used as a sole  basis for treatment. Nasal washings and  aspirates are unacceptable for Xpert Xpress SARS-CoV-2/FLU/RSV  testing. Fact Sheet for Patients: PinkCheek.be Fact Sheet for Healthcare Providers: GravelBags.it This test is not yet approved or cleared by the [place] FDA and  has been authorized for detection and/or diagnosis of SARS-CoV-2 by  FDA under an Emergency Use Authorization (EUA). This EUA will remain  in effect (meaning this test can be used) for the duration of the  Covid-19 declaration under Section 564(b)(1) of the Act, 21  U.S.C. section 360bbb-3(b)(1), unless the authorization is  terminated or revoked. Performed at [facility], [place] [address], [place], [place] [address]          Radiology Studies: No results found.      Scheduled Meds: . aspirin  81 mg Oral Daily  . atorvastatin  80 mg Oral Daily  . busPIRone  30 mg Oral BID  . calcium carbonate  500 mg of elemental calcium Oral Q breakfast  . carvedilol  12.5 mg Oral BID WC  . feeding supplement (ENSURE ENLIVE)  237 mL Oral BID BM  . furosemide  40 mg Intravenous BID  . hydrALAZINE  25 mg Oral Q8H  . hydroxychloroquine  200 mg Oral Daily  . melatonin  3 mg Oral QHS  . multivitamin with minerals  1 tablet Oral Daily  . nicotine  7 mg Transdermal Daily  . potassium chloride  40 mEq Oral Daily  . predniSONE  20 mg Oral Q breakfast  . rivaroxaban  15 mg Oral Q supper  . sertraline  50 mg Oral Daily  . sodium chloride flush  3 mL Intravenous Q12H  . sodium chloride flush  3 mL Intravenous Q12H  . spironolactone  12.5 mg Oral Daily   Continuous Infusions: . sodium chloride       LOS: 5 days      Georgette Shell, MD [facility]   To contact the attending provider between 7A-7P or the covering provider during after hours 7P-7A, please log into the web site [email or web address] and access using universal [place] password for  that web site. If you do not have the password, please call the hospital operator.  01/05/2020, 2:26 PM

## 2020-01-05 NOTE — Progress Notes (Addendum)
DAILY PROGRESS NOTE   Patient Name: Regina Ortiz Date of Encounter: 01/05/2020 Cardiologist: Candee Furbish, MD  Chief Complaint    Anxious about health.  Patient Profile   Regina Ortiz is a 74 y.o. female with a hx of COPD, chronic left bundle branch block, anxiety/PTSD, rheumatoid arthritis, Raynaud's disease, cauda equina s/p surgical decompression 12/2018, tobacco abuse, urinary retension and HTN who is being seen today for the evaluation of reduced EF at the request of Dr. Ree Kida.   Low risk stress test January 2019.  Echocardiogram in 2019 showed normal LV function with grade 1 diastolic dysfunction.  Last seen by Dr. Marlou Porch virtually 12/2018.  Ongoing chest discomfort with multiple risk factor.  Recommended coronary CT with FFR but never completed.  Subjective   Cath films personally reviewed as well as right heart hemodynamics- mild to moderate reduction in CO, pulmonary venous hypertension with elevated LVEDP. Creatinine improved - will increase lasix back to 40 mg IV BID. There is probably a significant LAD lesion, however, sounds difficult to intervene upon therefore will optimize medical therapy. May need to consider TEE evaluation of MR if it remains significant after diuresis.  05/05: having diarrhea, was having constipation, this is a chronic problem for her. Breathing ok Still feels very anxious No chest pain  05/06: miserable night due to diarrhea, concerned about health issues. No chest pain, feels anxiety, may be partly palpitations  Objective   Vitals:   01/04/20 2006 01/04/20 2301 01/05/20 0321 01/05/20 0803  BP: (!) 161/84 (!) 165/83 (!) 155/78 (!) 159/64  Pulse: 87 87 88 86  Resp: 20 20 20 20   Temp: 98.1 F (36.7 C) 98.6 F (37 C) 98.5 F (36.9 C) 98.1 F (36.7 C)  TempSrc: Oral Oral Oral Oral  SpO2: 99% 98% 97% (!) 74%  Weight:   42.1 kg   Height:        Intake/Output Summary (Last 24 hours) at 01/05/2020 1006 Last data filed at  01/04/2020 2255 Gross per 24 hour  Intake 360 ml  Output 550 ml  Net -190 ml   Filed Weights   01/03/20 0537 01/04/20 0350 01/05/20 0321  Weight: 43 kg 43.9 kg 42.1 kg    Physical Exam   General appearance: alert and pale Neck: no adenopathy, no carotid bruit, no JVD, supple, symmetrical, trachea midline and thyroid not enlarged, symmetric, no tenderness/mass/nodules Lungs: few rales bases Heart: regularly irregular rhythm and soft systolic murmur, LLSB Abdomen: soft, non-tender; bowel sounds normal; no masses,  no organomegaly Extremities: extremities normal, atraumatic, no cyanosis or edema Pulses: 2+ and symmetric Skin: Skin color, texture, turgor normal. No rashes or lesions Neurologic: Grossly normal Anxious affect  Inpatient Medications    Scheduled Meds: . aspirin  81 mg Oral Daily  . atorvastatin  80 mg Oral Daily  . busPIRone  30 mg Oral BID  . calcium carbonate  500 mg of elemental calcium Oral Q breakfast  . carvedilol  6.25 mg Oral BID WC  . feeding supplement (ENSURE ENLIVE)  237 mL Oral BID BM  . furosemide  40 mg Intravenous BID  . hydrALAZINE  25 mg Oral Q8H  . hydroxychloroquine  200 mg Oral Daily  . melatonin  3 mg Oral QHS  . multivitamin with minerals  1 tablet Oral Daily  . nicotine  7 mg Transdermal Daily  . predniSONE  20 mg Oral Q breakfast  . rivaroxaban  15 mg Oral Q supper  . sertraline  50 mg  Oral Daily  . sodium chloride flush  3 mL Intravenous Q12H  . sodium chloride flush  3 mL Intravenous Q12H  . spironolactone  12.5 mg Oral Daily    Continuous Infusions: . sodium chloride      PRN Meds: sodium chloride, acetaminophen, ALPRAZolam, levalbuterol, metoprolol tartrate, sodium chloride flush   Labs   Results for orders placed or performed during the hospital encounter of 12/31/19 (from the past 48 hour(s))  Basic metabolic panel     Status: Abnormal   Collection Time: 01/04/20  3:41 AM  Result Value Ref Range   Sodium 125 (L) 135 -  145 mmol/L   Potassium 4.0 3.5 - 5.1 mmol/L   Chloride 92 (L) 98 - 111 mmol/L   CO2 21 (L) 22 - 32 mmol/L   Glucose, Bld 91 70 - 99 mg/dL    Comment: Glucose reference range applies only to samples taken after fasting for at least 8 hours.   BUN 44 (H) 8 - 23 mg/dL   Creatinine, Ser 1.22 (H) 0.44 - 1.00 mg/dL   Calcium 8.2 (L) 8.9 - 10.3 mg/dL   GFR calc non Af Amer 44 (L) >60 mL/min   GFR calc Af Amer 51 (L) >60 mL/min   Anion gap 12 5 - 15    Comment: Performed at [facility] [address], [place], [place] [address]  CBC     Status: Abnormal   Collection Time: 01/04/20  3:41 AM  Result Value Ref Range   WBC 7.9 4.0 - 10.5 K/uL   RBC 3.17 (L) 3.87 - 5.11 MIL/uL   Hemoglobin 11.2 (L) 12.0 - 15.0 g/dL   HCT 31.8 (L) 36.0 - 46.0 %   MCV 100.3 (H) 80.0 - 100.0 fL   MCH 35.3 (H) 26.0 - 34.0 pg   MCHC 35.2 30.0 - 36.0 g/dL   RDW 14.1 11.5 - 15.5 %   Platelets 219 150 - 400 K/uL   nRBC 0.0 0.0 - 0.2 %    Comment: Performed at [facility], [place] [address] [place], [place] [id number]  Basic metabolic panel     Status: Abnormal   Collection Time: 01/05/20  2:48 AM  Result Value Ref Range   Sodium 124 (L) 135 - 145 mmol/L   Potassium 3.4 (L) 3.5 - 5.1 mmol/L   Chloride 93 (L) 98 - 111 mmol/L   CO2 24 22 - 32 mmol/L   Glucose, Bld 97 70 - 99 mg/dL    Comment: Glucose reference range applies only to samples taken after fasting for at least 8 hours.   BUN 40 (H) 8 - 23 mg/dL   Creatinine, Ser 1.17 (H) 0.44 - 1.00 mg/dL   Calcium 7.9 (L) 8.9 - 10.3 mg/dL   GFR calc non Af Amer 46 (L) >60 mL/min   GFR calc Af Amer 53 (L) >60 mL/min   Anion gap 7 5 - 15    Comment: Performed at [facility] [address], [place], [place] [address]    ECG   N/A  Telemetry   SR, [place] PVCs, PACs, episode Atach vs SVT- Personally Reviewed  Radiology    No results found.  Cardiac Studies   See cath results  Assessment   Principal Problem:   Acute on chronic  combined systolic and diastolic CHF (congestive heart failure) (HCC) Active Problems:   LBBB (left bundle branch block)   Anxiety   Depression   Rheumatoid arthritis ([place])  Chronic bronchitis (HCC)   Tobacco abuse   Acute urinary retention   Acute respiratory failure with hypoxia (HCC)   COPD with acute exacerbation (HCC)   Elevated troponin   Acute pulmonary edema (HCC)   Plan   1. BUN/Cr have improved today. I/O show poor po intake, but may not be complete. Wt down 4 lbs from yesterday. Sodium down to 124, K+ 3.4, will supplement. Low K+ likely due to diarrhea. With ectopy, keep K+ 4.0 and ck Mg. Chloride low at baseline, but slightly lower than on admit. Started on low-dose spiro. BP and HR still up, increase BB again.     Plan: IM hopes to d/c her in am. MD advise final med recs. Length of Stay:  LOS: 5 days   Cheney Gosch 01/05/2020, 10:06 AM

## 2020-01-05 NOTE — Progress Notes (Signed)
Physical Therapy Treatment Patient Details Name: Regina Ortiz MRN: [id number] DOB: September 24, 1945 Today's Date: 01/05/2020    History of Present Illness 74 yo admitted with acute respiratory failure with tachypnea, Afib and acute CHF exacerbation. Cardiac cath 5/4 and will be treated medically. PMhx: HTN, COPD, LBBB, hyponatremia, RA, Raynaud's, cauda equina syndrome with neurogenic bladder, anxiety/depression    PT Comments    Patient progressing with mobility this session walking much further and with encouragement participating in standing therex and side stepping in the room.  Aide from home here with her and singing to our dancing.  Regina Ortiz motivated despite some anxiety about going home.  Feel continued HHPT at d/c still appropriate.  Will follow until d/c.    Follow Up Recommendations  Home health PT     Equipment Recommendations  None recommended by PT    Recommendations for Other Services       Precautions / Restrictions Precautions Precautions: Fall    Mobility  Bed Mobility Overal bed mobility: Needs Assistance       Supine to sit: Supervision;HOB elevated     General bed mobility comments: increased time  Transfers Overall transfer level: Needs assistance Equipment used: Rolling walker (2 wheeled) Transfers: Sit to/from [facility] Sit to Stand: Min guard Stand pivot transfers: Min guard       General transfer comment: up to RW, to pull up brief, then needed to use BSC, her aide from home with her helping.  Ambulation/Gait Ambulation/Gait assistance: Supervision Gait Distance (Feet): 300 Feet Assistive device: Rolling walker (2 wheeled) Gait Pattern/deviations: Step-through pattern;Decreased stride length;Trunk flexed     General Gait Details: cues for forward gaze, no seated or standing rests needed, on RA VSS   Stairs             Wheelchair Mobility    Modified Rankin (Stroke Patients Only)       Balance  Overall balance assessment: Needs assistance   Sitting balance-Leahy Scale: Good Sitting balance - Comments: no assist needed at EOB     Standing balance-Leahy Scale: Fair Standing balance comment: can standing unaided, needs UE support for dynamic activity                            Cognition Arousal/Alertness: Awake/alert Behavior During Therapy: WFL for tasks assessed/performed Overall Cognitive Status: Within Functional Limits for tasks assessed                                        Exercises Other Exercises Other Exercises: sit<>stand x 7 Other Exercises: seated heel/toe ankle DF/PF x 10 Other Exercises: standing side stepping with UE support Other Exercises: standing marching in place x 10 with UE support    General Comments        Pertinent Vitals/Pain Pain Assessment: No/denies pain    Home Living                      Prior Function            PT Goals (current goals can now be found in the care plan section)      Frequency    Min 3X/week      PT Plan Current plan remains appropriate    Co-evaluation              AM-PAC  PT "6 Clicks" Mobility   Outcome Measure  Help needed turning from your back to your side while in a flat bed without using bedrails?: None Help needed moving from lying on your back to sitting on the side of a flat bed without using bedrails?: A Little Help needed moving to and from a bed to a chair (including a wheelchair)?: A Little Help needed standing up from a chair using your arms (e.g., wheelchair or bedside chair)?: A Little Help needed to walk in hospital room?: A Little Help needed climbing 3-5 steps with a railing? : A Little 6 Click Score: 19    End of Session   Activity Tolerance: Patient tolerated treatment well Patient left: in bed;with call bell/phone within reach;with nursing/sitter in room   PT Visit Diagnosis: Other abnormalities of gait and mobility  (R26.89);Muscle weakness (generalized) (M62.81)     Time: [id number] PT Time Calculation (min) (ACUTE ONLY): 18 min  Charges:  $Gait Training: 8-22 mins                     Magda Kiel, [place] [phone] 01/05/2020    Regina Ortiz 01/05/2020, 3:38 PM

## 2020-01-06 ENCOUNTER — Encounter (HOSPITAL_COMMUNITY): Payer: Self-pay | Admitting: Internal Medicine

## 2020-01-06 LAB — BASIC METABOLIC PANEL
Anion gap: 5 (ref 5–15)
BUN: 28 mg/dL — ABNORMAL HIGH (ref 8–23)
CO2: 25 mmol/L (ref 22–32)
Calcium: 7.9 mg/dL — ABNORMAL LOW (ref 8.9–10.3)
Chloride: 94 mmol/L — ABNORMAL LOW (ref 98–111)
Creatinine, Ser: 1.07 mg/dL — ABNORMAL HIGH (ref 0.44–1.00)
GFR calc Af Amer: 59 mL/min — ABNORMAL LOW (ref >60)
GFR calc non Af Amer: 51 mL/min — ABNORMAL LOW (ref >60)
Glucose, Bld: 111 mg/dL — ABNORMAL HIGH (ref 70–99)
Potassium: 4.2 mmol/L (ref 3.5–5.1)
Sodium: 124 mmol/L — ABNORMAL LOW (ref 135–145)

## 2020-01-06 LAB — MAGNESIUM: Magnesium: 2.7 mg/dL — ABNORMAL HIGH (ref 1.7–2.4)

## 2020-01-06 MED ORDER — ALPRAZOLAM 0.5 MG PO TABS: 0.5000 mg | ORAL_TABLET | Freq: Three times a day (TID) | ORAL | 2 refills | Status: DC | PRN

## 2020-01-06 MED ORDER — NICOTINE 7 MG/24HR TD PT24: 7.0000 mg | MEDICATED_PATCH | Freq: Every day | TRANSDERMAL | 0 refills | Status: DC

## 2020-01-06 MED ORDER — SPIRONOLACTONE 25 MG PO TABS: 12.5000 mg | ORAL_TABLET | Freq: Every day | ORAL | 2 refills | Status: DC

## 2020-01-06 MED ORDER — ASPIRIN 81 MG PO CHEW: 81.0000 mg | CHEWABLE_TABLET | Freq: Every day | ORAL | Status: DC

## 2020-01-06 MED ORDER — HYDRALAZINE HCL 25 MG PO TABS: 25.0000 mg | ORAL_TABLET | Freq: Three times a day (TID) | ORAL | 2 refills | Status: DC

## 2020-01-06 MED ORDER — LOPERAMIDE HCL 2 MG PO CAPS: 2.0000 mg | ORAL_CAPSULE | Freq: Four times a day (QID) | ORAL | 0 refills | Status: AC | PRN

## 2020-01-06 MED ORDER — ATORVASTATIN CALCIUM 80 MG PO TABS: 80.0000 mg | ORAL_TABLET | Freq: Every day | ORAL | 2 refills | Status: AC

## 2020-01-06 MED ORDER — SERTRALINE HCL 25 MG PO TABS: 25.0000 mg | ORAL_TABLET | Freq: Every day | ORAL | 2 refills | Status: DC

## 2020-01-06 MED ORDER — ADULT MULTIVITAMIN W/MINERALS CH: 1.0000 | ORAL_TABLET | Freq: Every day | ORAL | Status: AC

## 2020-01-06 MED ORDER — CARVEDILOL 12.5 MG PO TABS: 12.5000 mg | ORAL_TABLET | Freq: Two times a day (BID) | ORAL | 2 refills | Status: DC

## 2020-01-06 MED ORDER — CEPHALEXIN 500 MG PO CAPS: 500.0000 mg | ORAL_CAPSULE | Freq: Two times a day (BID) | ORAL | 0 refills | Status: AC

## 2020-01-06 MED ORDER — FUROSEMIDE 40 MG PO TABS: 40.0000 mg | ORAL_TABLET | Freq: Every day | ORAL | 2 refills | Status: DC

## 2020-01-06 MED ORDER — FUROSEMIDE 40 MG PO TABS: 40.0000 mg | ORAL_TABLET | Freq: Every day | ORAL | Status: DC

## 2020-01-06 MED ORDER — SACCHAROMYCES BOULARDII 250 MG PO CAPS: 250.0000 mg | ORAL_CAPSULE | Freq: Two times a day (BID) | ORAL | 1 refills | Status: AC

## 2020-01-06 MED ORDER — RIVAROXABAN 15 MG PO TABS: 15.0000 mg | ORAL_TABLET | Freq: Every day | ORAL | 1 refills | Status: DC

## 2020-01-06 MED FILL — FUROSEMIDE 40 MG TABLET: 40 | 30 days supply | Qty: 30 | Fill #0

## 2020-01-06 MED FILL — LOPERAMIDE 2 MG CAPSULE: 2 | 7 days supply | Qty: 30 | Fill #0

## 2020-01-06 MED FILL — ALPRAZolam 0.5 MG TABS: 0.5 | 30 days supply | Qty: 90 | Fill #0

## 2020-01-06 MED FILL — FLORASTOR 250 MG CAPSULE: 250 | 10 days supply | Qty: 20 | Fill #0

## 2020-01-06 MED FILL — NICOTINE 7 MG/24HR PATCH: 7 | 28 days supply | Qty: 28 | Fill #0

## 2020-01-06 MED FILL — SERTRALINE HCL 25 MG TABS: 25 | 30 days supply | Qty: 30 | Fill #0

## 2020-01-06 MED FILL — hydrALAZINE HCL 25 MG TABS: 25 | 30 days supply | Qty: 90 | Fill #0

## 2020-01-06 MED FILL — CARVEDILOL 12.5 MG TABLET: 12.5 | 30 days supply | Qty: 60 | Fill #0

## 2020-01-06 MED FILL — SPIRONOLACTONE 25 MG TABLET: 25 | 60 days supply | Qty: 30 | Fill #0

## 2020-01-06 MED FILL — CEPHALEXIN 500 MG CAPS: 500 | 6 days supply | Qty: 12 | Fill #0

## 2020-01-06 MED FILL — XARELTO 15 MG TABLET: 15 | 30 days supply | Qty: 30 | Fill #0

## 2020-01-06 MED FILL — ATORVASTATIN CALCIUM 80 MG: 80 | 30 days supply | Qty: 30 | Fill #0

## 2020-01-06 NOTE — Care Management Important Message (Signed)
Important Message  Patient Details  Name: Regina Ortiz MRN: [id number] Date of Birth: 05/27/1946   Medicare Important Message Given:  Yes     Orbie Pyo 01/06/2020, 1:09 PM

## 2020-01-06 NOTE — TOC Transition Note (Signed)
Transition of Care [facility]) - CM/SW Discharge Note Marvetta Gibbons RN, BSN Transitions of Care Unit 4E- RN Case Manager [phone]   Patient Details  Name: Regina Ortiz MRN: [id number] Date of Birth: 12-Mar-1946  Transition of Care [facility]) CM/SW Contact:  [name], RN Phone Number: 01/06/2020, 1:25 PM   Clinical Narrative:    Pt stable for transition home today, caregiver annette here to transport home. Orders placed for HHPT. In to speak with pt at bedside- list provided for [facility] choice- Per CMS guidelines from medicare.gov website with star ratings (copy placed in shadow chart)- pt and [name] both report pt has used [facility] in past and would prefer to use them again if able. Discussed DME- pt has all needed DME at home at this time- no DME need identified. [facility] pharmacy to fill meds prior to discharge.  Call made to [facility] with [name] for [name] referral- per [name] they are able to accept referral for start of care within 48 hr of discharge.  Went back to room to let pt and caregiver know [name] had accepted.    Final next level of care: [place] Barriers to Discharge: No Barriers Identified   Patient Goals and CMS Choice Patient states their goals for this hospitalization and ongoing recovery are:: to be able to do more for myself CMS Medicare.gov Compare Post Acute Care list provided to:: Patient Choice offered to / list presented to : Patient  Discharge Placement               Home with [facility]        Discharge Plan and Services   Discharge Planning Services: CM Consult Post Acute Care Choice: Home Health          DME Arranged: N/A DME Agency: NA       HH Arranged: PT HH Agency: [place] Date [place]: 01/06/20 Time HH Agency Contacted: 1300 Representative spoke with at [place]: [place] ([place]) Interventions     Readmission Risk Interventions Readmission Risk Prevention Plan 01/06/2020  02/22/2019 02/16/2019  Transportation Screening Complete Complete Complete  PCP or Specialist Appt within 3-5 Days - - Complete  HRI or [place] - - Complete  Social Work Consult for [place] Planning/Counseling - - Complete  Palliative Care Screening - - Not Applicable  Medication Review Press photographer) Complete Complete Complete  PCP or Specialist appointment within 3-5 days of discharge Complete Complete -  [place] or Home Care Consult Complete Complete -  SW Recovery Care/Counseling Consult Complete Complete -  Palliative Care Screening Complete Not Applicable -  [place] Not Applicable Not Applicable -  Some recent data might be hidden

## 2020-01-06 NOTE — Progress Notes (Signed)
DAILY PROGRESS NOTE   Patient Name: Regina Ortiz Date of Encounter: 01/06/2020 Cardiologist: Candee Furbish, MD  Chief Complaint     Patient Profile   Regina Ortiz is a 74 y.o. female with a hx of COPD, chronic left bundle branch block, anxiety/PTSD, rheumatoid arthritis, Raynaud's disease, cauda equina s/p surgical decompression 12/2018, tobacco abuse, urinary retension and HTN who is being seen today for the evaluation of reduced EF at the request of Dr. Ree Kida.   Low risk stress test January 2019.  Echocardiogram in 2019 showed normal LV function with grade 1 diastolic dysfunction.  Last seen by Dr. Marlou Porch virtually 12/2018.  Ongoing chest discomfort with multiple risk factor.  Recommended coronary CT with FFR but never completed.  Subjective   About net even fluids. Creatinine improved  To 1.07 today (from 1.17). Still on lasix 40 mg IV BID.  Objective   Vitals:   01/06/20 0400 01/06/20 0500 01/06/20 0506 01/06/20 0757  BP:   (!) 160/84 (!) 142/72  Pulse:   77   Resp: 15 13 17    Temp:   98.7 F (37.1 C) 98 F (36.7 C)  TempSrc:   Oral Oral  SpO2:   99%   Weight:      Height:        Intake/Output Summary (Last 24 hours) at 01/06/2020 1046 Last data filed at 01/06/2020 0600 Gross per 24 hour  Intake 600 ml  Output 525 ml  Net 75 ml   Filed Weights   01/04/20 0350 01/05/20 0321 01/06/20 0247  Weight: 43.9 kg 42.1 kg 42.7 kg    Physical Exam   General appearance: alert and pale Neck: no adenopathy, no carotid bruit, no JVD, supple, symmetrical, trachea midline and thyroid not enlarged, symmetric, no tenderness/mass/nodules Lungs: few rales bases Heart: regularly irregular rhythm and soft systolic murmur, LLSB Abdomen: soft, non-tender; bowel sounds normal; no masses,  no organomegaly Extremities: extremities normal, atraumatic, no cyanosis or edema Pulses: 2+ and symmetric Skin: Skin color, texture, turgor normal. No rashes or lesions Neurologic:  Grossly normal Anxious affect  Inpatient Medications    Scheduled Meds: . aspirin  81 mg Oral Daily  . atorvastatin  80 mg Oral Daily  . busPIRone  30 mg Oral BID  . calcium carbonate  500 mg of elemental calcium Oral Q breakfast  . carvedilol  12.5 mg Oral BID WC  . cephALEXin  500 mg Oral Q12H  . feeding supplement (ENSURE ENLIVE)  237 mL Oral BID BM  . furosemide  40 mg Intravenous BID  . hydrALAZINE  25 mg Oral Q8H  . hydroxychloroquine  200 mg Oral Daily  . melatonin  3 mg Oral QHS  . multivitamin with minerals  1 tablet Oral Daily  . nicotine  7 mg Transdermal Daily  . potassium chloride  40 mEq Oral Daily  . predniSONE  20 mg Oral Q breakfast  . rivaroxaban  15 mg Oral Q supper  . saccharomyces boulardii  250 mg Oral BID  . sertraline  25 mg Oral Daily  . sodium chloride flush  3 mL Intravenous Q12H  . sodium chloride flush  3 mL Intravenous Q12H  . spironolactone  12.5 mg Oral Daily    Continuous Infusions: . sodium chloride      PRN Meds: sodium chloride, acetaminophen, ALPRAZolam, levalbuterol, loperamide, metoprolol tartrate, sodium chloride flush   Labs   Results for orders placed or performed during the hospital encounter of 12/31/19 (from the past 48  hour(s))  Basic metabolic panel     Status: Abnormal   Collection Time: 01/05/20  2:48 AM  Result Value Ref Range   Sodium 124 (L) 135 - 145 mmol/L   Potassium 3.4 (L) 3.5 - 5.1 mmol/L   Chloride 93 (L) 98 - 111 mmol/L   CO2 24 22 - 32 mmol/L   Glucose, Bld 97 70 - 99 mg/dL    Comment: Glucose reference range applies only to samples taken after fasting for at least 8 hours.   BUN 40 (H) 8 - 23 mg/dL   Creatinine, Ser 1.17 (H) 0.44 - 1.00 mg/dL   Calcium 7.9 (L) 8.9 - 10.3 mg/dL   GFR calc non Af Amer 46 (L) >60 mL/min   GFR calc Af Amer 53 (L) >60 mL/min   Anion gap 7 5 - 15    Comment: Performed at [facility] [address] [place], [place] [address]  Magnesium     Status: None   Collection  Time: 01/05/20 10:25 AM  Result Value Ref Range   Magnesium 1.9 1.7 - 2.4 mg/dL    Comment: Performed at [facility], [place] [address] [place], [place] [id number]  Basic metabolic panel     Status: Abnormal   Collection Time: 01/06/20  8:20 AM  Result Value Ref Range   Sodium 124 (L) 135 - 145 mmol/L   Potassium 4.2 3.5 - 5.1 mmol/L   Chloride 94 (L) 98 - 111 mmol/L   CO2 25 22 - 32 mmol/L   Glucose, Bld 111 (H) 70 - 99 mg/dL    Comment: Glucose reference range applies only to samples taken after fasting for at least 8 hours.   BUN 28 (H) 8 - 23 mg/dL   Creatinine, Ser 1.07 (H) 0.44 - 1.00 mg/dL   Calcium 7.9 (L) 8.9 - 10.3 mg/dL   GFR calc non Af Amer 51 (L) >60 mL/min   GFR calc Af Amer 59 (L) >60 mL/min   Anion gap 5 5 - 15    Comment: Performed at [facility] [address] [place], [place] [address]  Magnesium     Status: Abnormal   Collection Time: 01/06/20  8:20 AM  Result Value Ref Range   Magnesium 2.7 (H) 1.7 - 2.4 mg/dL    Comment: Performed at [facility] [address], [place], [place] [address]    ECG   N/A  Telemetry   Predominantly sinus rhythm with ectopy- Personally Reviewed  Radiology    No results found.  Cardiac Studies   See cath results  Assessment   Principal Problem:   Acute on chronic combined systolic and diastolic CHF (congestive heart failure) (HCC) Active Problems:   LBBB (left bundle branch block)   Anxiety   Depression   Rheumatoid arthritis (HCC)   Chronic bronchitis (HCC)   Tobacco abuse   Acute urinary retention   Acute respiratory failure with hypoxia (HCC)   COPD with acute exacerbation (HCC)   Elevated troponin   Acute pulmonary edema ([place])   Plan   1. Creatinine improved - think she is close to diuretic endpoint. Had IV lasix this am - will d/c and switch to lasix 40 mg po daily tomorrow. Mag and potassium are good today. Could likely be discharged today from my standpoint. Follow-up with Dr.  Marlou Porch or APP at [facility] in 1-2 weeks after discharge.  [facility] will sign off.   Medication Recommendations:  As above Other recommendations (labs, testing, etc):  none Follow up as an outpatient:  [place] or APP at [facility] office     Pixie Casino, MD, [facility], [place] Director of the Advanced Lipid Disorders &  Cardiovascular Risk Reduction Clinic Diplomate of the American Board of Clinical Lipidology Attending Cardiologist  Direct Dial: [phone]  Fax: [phone]  Website:  [email or web address]  Length of Stay:  LOS: 6 days   Pixie Casino 01/06/2020, 10:46 AM

## 2020-01-06 NOTE — Discharge Summary (Signed)
Physician Discharge Summary  Regina Ortiz [id number] DOB: 17-Jun-1946 DOA: 12/31/2019  PCP: Gayland Curry, DO  Admit date: 12/31/2019 Discharge date: 01/06/2020  Admitted From: Home Disposition: Home  Recommendations for Outpatient Follow-up:  1. Follow up with PCP in 1-2 weeks 2. Please obtain BMP/CBC in one week Please follow up with cardiologist   Home Health yes Equipment/Devices: None  Discharge Condition stable and improved CODE STATUS: Full code Diet recommendation: Cardiac diet  Brief/Interim Summary:HPI on 12/31/2019 by Dr. Fuller Plan Regina Ortiz a 74 y.o.femalewith medical history significant ofhypertension, COPD, chronic hyponatremia, LBBB, rheumatoid arthritis, Raynaud's disease,neurogenic bladder, cauda equina syndromeS/P SURGICALdecompression in 01/28/2019 by Dr. Theophilus Bones, anxiety, depression,tobacco abuse,and GERDpresents with complaints of shortness of breath. She had been up all night as she was sweating profusely and having to change her shirt. Thereafter, developed progressively worsening shortness of breath. Associated symptoms included wheezing, very minimal cough, leg swelling, and later develops substernal chest pressure without radiation. Patient continues to smoke a couple cigarettes throughout the week, but would like to quit. She feels that her anxiety has worsened despite her Zoloft dose being recently increased from 50 mg to 100 mg daily a couple days ago. At baseline patient reports that she is not on oxygen.  En routewith EMS patient was noted to be hypoxic and was initially placed on a nonrebreather. She received 324 mg of aspirin and 2 nitroglycerinprior to arrival.   Discharge Diagnoses:  Principal Problem:   Acute on chronic combined systolic and diastolic CHF (congestive heart failure) (HCC) Active Problems:   LBBB (left bundle branch block)   Anxiety   Depression   Rheumatoid arthritis (HCC)   Chronic  bronchitis (HCC)   Tobacco abuse   Acute urinary retention   Acute respiratory failure with hypoxia (HCC)   COPD with acute exacerbation (HCC)   Elevated troponin   Acute pulmonary edema (HCC)  #1 acute combined diastolic/new systolic heart failure-patient presented with shortness of breath and was found to be very hypoxic placed on nonrebreather at the time of admission. BNP was 3381. Chest x-ray showed pulmonary edema.  She was treated with Lasix, Aldactone  Cath 5/5 -LAD diagonal [place] 111 bifurcation stenosis with LAD 50 to 70% and diagonal 60%. Within the region of heavy calcification.  Left main is widely patent  Circumflex is widely patent  Right coronary is dominant and widely patent Left ventriculogram demonstrates EF 40% with mid anterior wall towards the apex hypokinesis more dyssynchrony related to left bundle branch block  #2 AKI with CKD stage III remained stable on Lasix.    #3 Raynaud's disease/rheumatoid arthritis on Plaquenil and steroids chronically.  Continue.  #4 status post hypertensive urgency continue Coreg per cardiology recommendations.  Cardizem was not restarted.    #5 anxiety depression continue BuSpar and Xanax will increase Xanax dose.  #6 urinary retention resolved.  #7 acute on chronic bronchitis resolved.   #8 chronic hyponatremia-sodium 124 on the day of discharge same as a day prior to discharge.  SSRI dose was decreased.  Please monitor closely as an outpatient.    #9 tobacco abuse discussed smoking cessation continue nicotine patch  #10 A. fib/a flutter-continue Coreg, Xarelto per cardiology.  #11 diarrhea better after starting her on probiotics and as needed Imodium.  #12 infected Bartholin's gland warm soaks to the area.  Keflex for 7 days.  Warm soak to the area few times a day.  Discharge Instructions  Discharge Instructions    Call MD for:  persistant dizziness or light-headedness   Complete by: As directed    Call  MD for:  persistant nausea and vomiting   Complete by: As directed    Call MD for:  severe uncontrolled pain   Complete by: As directed    Diet - low sodium heart healthy   Complete by: As directed    Diet - low sodium heart healthy   Complete by: As directed    Increase activity slowly   Complete by: As directed    Increase activity slowly   Complete by: As directed      Allergies as of 01/06/2020      Reactions   Latex Swelling, Other (See Comments)    fever blisters   Augmentin [amoxicillin-pot Clavulanate] Nausea And Vomiting   Doxycycline Other (See Comments)   Blurred vision, nervous, unsteady   Hydrocodone Itching   Acetaminophen:itching, confusion, hallucinations-side effects-Onset date 07/15/2018   Lactose Intolerance (gi) Other (See Comments)   Unknown, per pt   Lialda [mesalamine] Diarrhea   Prinivil [lisinopril] Diarrhea   Triamcinolone Acetonide Other (See Comments)   REDNESS AND PAIN   Amlodipine Rash   Lotensin [benazepril Hcl] Rash      Medication List    STOP taking these medications   diltiazem 360 MG 24 hr capsule Commonly known as: TIAZAC     TAKE these medications   acetaminophen 325 MG tablet Commonly known as: TYLENOL Take 650 mg by mouth every 6 (six) hours as needed for mild pain. Notes to patient: For pain. Do not take more than 2000mg  per day   ALPRAZolam 0.5 MG tablet Commonly known as: XANAX Take 1 tablet (0.5 mg total) by mouth 3 (three) times daily as needed for anxiety. What changed:   medication strength  how much to take  when to take this Notes to patient: **INCREASED** For anxiety. May cause drowsiness and dizziness   aspirin 81 MG chewable tablet Chew 1 tablet (81 mg total) by mouth daily. Start taking on: Jan 07, 2020 Notes to patient: **NEW** For heart health    atorvastatin 80 MG tablet Commonly known as: LIPITOR Take 1 tablet (80 mg total) by mouth daily. Start taking on: Jan 07, 2020 Notes to patient: **NEW** To  lower cholesterol   busPIRone 30 MG tablet Commonly known as: BUSPAR Take 30 mg by mouth 2 (two) times daily. Notes to patient: For anxiety and mood   calcium carbonate 1500 (600 Ca) MG Tabs tablet Commonly known as: OSCAL Take 600 mg of elemental calcium by mouth daily. Notes to patient: Calcium supplement   carvedilol 12.5 MG tablet Commonly known as: COREG Take 1 tablet (12.5 mg total) by mouth 2 (two) times daily with a meal. Notes to patient: **NEW** To lower heart rate and blood pressure   cephALEXin 500 MG capsule Commonly known as: KEFLEX Take 1 capsule (500 mg total) by mouth every 12 (twelve) hours for 6 days.   denosumab 60 MG/ML Sosy injection Commonly known as: PROLIA Inject 60 mg into the skin every 6 (six) months. Notes to patient: To strengthen bone    ferrous sulfate 325 (65 FE) MG tablet Take 325 mg by mouth daily with breakfast. Notes to patient: Iron suplpement   furosemide 40 MG tablet Commonly known as: LASIX Take 1 tablet (40 mg total) by mouth daily. Start taking on: Jan 07, 2020 Notes to patient: **NEW** Water pill. To prevent water accumulation   hydrALAZINE 25 MG tablet Commonly known as: APRESOLINE Take 1  tablet (25 mg total) by mouth every 8 (eight) hours. Notes to patient: **NEW** To lower blood pressure   hydroxychloroquine 200 MG tablet Commonly known as: PLAQUENIL Take 200 mg by mouth daily. Notes to patient: For arthritis and bronchitis   loperamide 2 MG capsule Commonly known as: IMODIUM Take 1 capsule (2 mg total) by mouth every 6 (six) hours as needed for diarrhea or loose stools. Notes to patient: **NEW** For diarrhea   multivitamin with minerals Tabs tablet Take 1 tablet by mouth daily. Start taking on: Jan 07, 2020 Notes to patient: **NEW** Supplement   nicotine 7 mg/24hr patch Commonly known as: NICODERM CQ - dosed in mg/24 hr Place 1 patch (7 mg total) onto the skin daily. Start taking on: Jan 07, 2020 Notes to  patient: **NEW** Nicotine replacement to help quit tobacco   polyethylene glycol 17 g packet Commonly known as: MIRALAX / GLYCOLAX Take 17 g by mouth daily as needed for mild constipation. Notes to patient: For constipation   predniSONE 5 MG tablet Commonly known as: DELTASONE Take 5 mg by mouth daily. Notes to patient: For arthritis   Rivaroxaban 15 MG Tabs tablet Commonly known as: XARELTO Take 1 tablet (15 mg total) by mouth daily with supper. Notes to patient: **NEW** To prevent clots and stroke   saccharomyces boulardii 250 MG capsule Commonly known as: FLORASTOR Take 1 capsule (250 mg total) by mouth 2 (two) times daily. Notes to patient: **NEW** Probiotic supplement   sertraline 25 MG tablet Commonly known as: ZOLOFT Take 1 tablet (25 mg total) by mouth daily. Start taking on: Jan 07, 2020 What changed:   medication strength  how much to take Notes to patient: **DEREASED** For anxiety and mood   spironolactone 25 MG tablet Commonly known as: ALDACTONE Take 0.5 tablets (12.5 mg total) by mouth daily. Start taking on: Jan 07, 2020 Notes to patient: **NEW** To prevent water accumulation      Follow-up Information    Reed, Tiffany L, DO Follow up.   Specialty: Geriatric Medicine Contact information: [address] [place] [address] [id number]        Jerline Pain, MD .   Specialty: Cardiology Contact information: [phone] [place] [address] [phone]          Allergies  Allergen Reactions  . Latex Swelling and Other (See Comments)     fever blisters  . Augmentin [Amoxicillin-Pot Clavulanate] Nausea And Vomiting  . Doxycycline Other (See Comments)    Blurred vision, nervous, unsteady  . Hydrocodone Itching    Acetaminophen:itching, confusion, hallucinations-side effects-Onset date 07/15/2018  . Lactose Intolerance (Gi) Other (See Comments)    Unknown, per pt  . Lialda [Mesalamine] Diarrhea  . Prinivil  [Lisinopril] Diarrhea  . Triamcinolone Acetonide Other (See Comments)    REDNESS AND PAIN  . Amlodipine Rash  . Lotensin [Benazepril Hcl] Rash    Consultations: Cardiology  Procedures/Studies: CARDIAC CATHETERIZATION  Addendum Date: 01/03/2020    Heavy left main and proximal LAD calcification.  Moderate to moderately severe proximal LAD diagonal bifurcation stenosis up to 70%.  Widely patent circumflex.  Widely patent right coronary.  Mid anterior wall hypokinesis greater than other walls related to ischemia from LAD versus dyssynchrony associated with left bundle branch block.  Estimated LVEF is 35 to 40%.  LVEDP is 25 mmHg.  Significant mitral regurgitation by 2D Doppler echocardiogram.  Severe hypertension noted during the procedure. RECOMMENDATIONS:  Medical therapy of heart failure including  additional diuresis.  Preventive therapy including high intensity statin therapy for LAD disease.  Downstream management could include PCI with debulking using atherectomy versus combined coronary artery bypass grafting and mitral valve repair depending upon the status of the mitral valve and role in development of acute pulmonary edema.  Once stable, may need transesophageal echocardiography.  Further discussion concerning severity of mitral valve disease on appropriate management.  Reassess kidney function in a.m.  In lab hypertension treated with IV hydralazine.  Aggressive management of hypertension is required and may have been the driving precipitant for acute heart failure.  Result Date: 01/03/2020  Heavy left main and proximal LAD calcification.  Moderate to moderately severe proximal LAD diagonal bifurcation stenosis up to 70%.  Widely patent circumflex.  Widely patent right coronary.  Mid anterior wall hypokinesis greater than other walls related to ischemia from LAD versus dyssynchrony associated with left bundle branch block.  Estimated LVEF is 35 to 40%.  LVEDP is 25 mmHg.   Significant mitral regurgitation by 2D Doppler echocardiogram. RECOMMENDATIONS:  Medical therapy of heart failure including additional diuresis.  Preventive therapy including high intensity statin therapy for LAD disease.  Downstream management could include PCI with debulking using atherectomy versus combined coronary artery bypass grafting and mitral valve repair depending upon the status of the mitral valve and role in development of acute pulmonary edema.  Once stable, may need transesophageal echocardiography.  Further discussion concerning severity of mitral valve disease on appropriate management.  Reassess kidney function in a.m.  DG Chest Port 1 View  Result Date: 12/31/2019 CLINICAL DATA:  Shortness of breath EXAM: PORTABLE CHEST 1 VIEW COMPARISON:  03/12/2019 FINDINGS: The lungs are hyperinflated with diffuse interstitial prominence. No pleural effusion or pneumothorax. Normal cardiomediastinal contours. Reticular opacities at both lung bases. IMPRESSION: COPD without focal airspace disease. Reticular opacities at both lung bases may indicate pulmonary edema or developing infection. Electronically Signed   By: Ulyses Jarred M.D.   On: 12/31/2019 05:26   ECHOCARDIOGRAM COMPLETE  Result Date: 01/01/2020    ECHOCARDIOGRAM REPORT   Patient Name:   Regina Ortiz Date of Exam: 01/01/2020 Medical Rec #:  [id number]          Height:       64.0 in Accession #:    [id number]         Weight:       94.6 lb Date of Birth:  10/06/1945          BSA:          1.423 m Patient Age:    21 years           BP:           140/73 mmHg Patient Gender: F                  HR:           72 bpm. Exam Location:  Inpatient Procedure: 2D Echo, Cardiac Doppler and Color Doppler Indications:    CHF-Acute Diastolic  History:        Patient has prior history of Echocardiogram examinations, most                 recent 09/24/2017. COPD, Arrythmias:LBBB,                 Signs/Symptoms:Shortness of Breath; Risk Factors:Current  Smoker,                 Hypertension and Dyslipidemia.  Sonographer:  Clayton Lefort RDCS (AE) Referring Phys: [id number] RONDELL A SMITH IMPRESSIONS  1. Moderate to severe global reduction in LV systolic function; grade 1 diastolic dysfunction; moderate LVH; moderate MR; severe LAE.  2. Left ventricular ejection fraction, by estimation, is 30 to 35%. The left ventricle has moderately decreased function. The left ventricle demonstrates global hypokinesis. There is moderate left ventricular hypertrophy. Left ventricular diastolic parameters are consistent with Grade I diastolic dysfunction (impaired relaxation).  3. Right ventricular systolic function is normal. The right ventricular size is normal.  4. Left atrial size was severely dilated.  5. The mitral valve is normal in structure. Moderate mitral valve regurgitation. No evidence of mitral stenosis.  6. The aortic valve is tricuspid. Aortic valve regurgitation is not visualized. Mild aortic valve sclerosis is present, with no evidence of aortic valve stenosis.  7. The inferior vena cava is normal in size with greater than 50% respiratory variability, suggesting right atrial pressure of 3 mmHg. FINDINGS  Left Ventricle: Left ventricular ejection fraction, by estimation, is 30 to 35%. The left ventricle has moderately decreased function. The left ventricle demonstrates global hypokinesis. The left ventricular internal cavity size was normal in size. There is moderate left ventricular hypertrophy. Left ventricular diastolic parameters are consistent with Grade I diastolic dysfunction (impaired relaxation). Right Ventricle: The right ventricular size is normal. Right ventricular systolic function is normal. There is normal pulmonary artery systolic pressure. The tricuspid regurgitant velocity is 2.57 m/s, and with an assumed right atrial pressure of 3 mmHg,  the estimated right ventricular systolic pressure is [id number]. Left Atrium: Left atrial size was severely dilated.  Right Atrium: Right atrial size was normal in size. Pericardium: There is no evidence of pericardial effusion. Mitral Valve: The mitral valve is normal in structure. Normal mobility of the mitral valve leaflets. Moderate mitral valve regurgitation. No evidence of mitral valve stenosis. MV peak gradient, 5.8 mmHg. The mean mitral valve gradient is 2.0 mmHg. Tricuspid Valve: The tricuspid valve is normal in structure. Tricuspid valve regurgitation is mild . No evidence of tricuspid stenosis. Aortic Valve: The aortic valve is tricuspid. Aortic valve regurgitation is not visualized. Mild aortic valve sclerosis is present, with no evidence of aortic valve stenosis. Aortic valve mean gradient measures 3.0 mmHg. Aortic valve peak gradient measures 4.3 mmHg. Aortic valve area, by VTI measures 2.13 cm. Pulmonic Valve: The pulmonic valve was not well visualized. Pulmonic valve regurgitation is mild. No evidence of pulmonic stenosis. Aorta: The aortic root is normal in size and structure. Venous: The inferior vena cava is normal in size with greater than 50% respiratory variability, suggesting right atrial pressure of 3 mmHg. IAS/Shunts: No atrial level shunt detected by color flow Doppler. Additional Comments: Moderate to severe global reduction in LV systolic function; grade 1 diastolic dysfunction; moderate LVH; moderate MR; severe LAE.  LEFT VENTRICLE PLAX 2D LVIDd:         4.70 cm LVIDs:         3.80 cm LV PW:         1.40 cm LV IVS:        1.30 cm LVOT diam:     1.70 cm LV SV:         50 LV SV Index:   35 LVOT Area:     2.27 cm  LV Volumes (MOD) LV vol d, MOD A2C: 84.5 ml LV vol d, MOD A4C: 99.0 ml LV vol s, MOD A2C: 57.8 ml LV vol s, MOD A4C: 66.8 ml LV  SV MOD A2C:     26.7 ml LV SV MOD A4C:     99.0 ml LV SV MOD BP:      29.8 ml RIGHT VENTRICLE            IVC RV Basal diam:  2.50 cm    IVC diam: 2.00 cm RV S prime:     9.36 cm/s TAPSE (M-mode): 2.2 cm LEFT ATRIUM             Index       RIGHT ATRIUM           Index  LA diam:        4.20 cm 2.95 cm/m  RA Area:     12.70 cm LA Vol (A2C):   70.1 ml 49.26 ml/m RA Volume:   26.60 ml  18.69 ml/m LA Vol (A4C):   90.6 ml 63.67 ml/m LA Biplane Vol: 82.6 ml 58.04 ml/m  AORTIC VALVE AV Area (Vmax):    2.12 cm AV Area (Vmean):   1.88 cm AV Area (VTI):     2.13 cm AV Vmax:           104.00 cm/s AV Vmean:          79.600 cm/s AV VTI:            0.237 m AV Peak Grad:      4.3 mmHg AV Mean Grad:      3.0 mmHg LVOT Vmax:         97.30 cm/s LVOT Vmean:        66.100 cm/s LVOT VTI:          0.222 m LVOT/AV VTI ratio: 0.94  AORTA Ao Root diam: 3.00 cm Ao Asc diam:  3.00 cm MITRAL VALVE                 TRICUSPID VALVE MV Area (PHT): 4.10 cm      TR Peak grad:   26.4 mmHg MV Peak grad:  5.8 mmHg      TR Vmax:        257.00 cm/s MV Mean grad:  2.0 mmHg MV Vmax:       1.20 m/s      SHUNTS MV Vmean:      70.3 cm/s     Systemic VTI:  0.22 m MV Decel Time: 185 msec      Systemic Diam: 1.70 cm MR Peak grad:    109.4 mmHg MR Mean grad:    64.0 mmHg MR Vmax:         523.00 cm/s MR Vmean:        369.0 cm/s MR PISA:         3.08 cm MR PISA Eff ROA: 18 mm MR PISA Radius:  0.70 cm MV E velocity: 101.00 cm/s MV A velocity: 118.00 cm/s MV E/A ratio:  0.86 Kirk Ruths MD Electronically signed by Kirk Ruths MD Signature Date/Time: 01/01/2020/3:56:00 PM    Final     (Echo, Carotid, EGD, Colonoscopy, ERCP)    Subjective: Patient is resting in bed no complaints of shortness of breath diarrhea nausea or chest pain however remains anxious  Discharge Exam: Vitals:   01/06/20 0757 01/06/20 1205  BP: (!) 142/72 (!) 142/71  Pulse:  72  Resp:  18  Temp: 98 F (36.7 C) (!) 97.5 F (36.4 C)  SpO2:  99%   Vitals:   01/06/20 0500 01/06/20 0506 01/06/20 0757 01/06/20 1205  BP:  (!) 160/84 Marland Kitchen)  142/72 (!) 142/71  Pulse:  77  72  Resp: 13 17  18   Temp:  98.7 F (37.1 C) 98 F (36.7 C) (!) 97.5 F (36.4 C)  TempSrc:  Oral Oral Oral  SpO2:  99%  99%  Weight:      Height:         General: Pt is alert, awake, not in acute distress Cardiovascular: RRR, S1/S2 +, no rubs, no gallops Respiratory: CTA bilaterally, no wheezing, no rhonchi Abdominal: Soft, NT, ND, bowel sounds + Extremities: no edema, no cyanosis    The results of significant diagnostics from this hospitalization (including imaging, microbiology, ancillary and laboratory) are listed below for reference.     Microbiology: Recent Results (from the past 240 hour(s))  Respiratory Panel by RT PCR (Flu A&B, Covid) - Nasopharyngeal Swab     Status: None   Collection Time: 12/31/19  7:15 AM   Specimen: Nasopharyngeal Swab  Result Value Ref Range Status   SARS Coronavirus 2 by RT PCR NEGATIVE NEGATIVE Final    Comment: (NOTE) SARS-CoV-2 target nucleic acids are NOT DETECTED. The SARS-CoV-2 RNA is generally detectable in upper respiratoy specimens during the acute phase of infection. The lowest concentration of SARS-CoV-2 viral copies this assay can detect is 131 copies/mL. A negative result does not preclude SARS-Cov-2 infection and should not be used as the sole basis for treatment or other patient management decisions. A negative result may occur with  improper specimen collection/handling, submission of specimen other than nasopharyngeal swab, presence of viral mutation(s) within the areas targeted by this assay, and inadequate number of viral copies (<131 copies/mL). A negative result must be combined with clinical observations, patient history, and epidemiological information. The expected result is Negative. Fact Sheet for Patients:  PinkCheek.be Fact Sheet for Healthcare Providers:  GravelBags.it This test is not yet ap proved or cleared by the [place] FDA and  has been authorized for detection and/or diagnosis of SARS-CoV-2 by FDA under an Emergency Use Authorization (EUA). This EUA will remain  in effect (meaning this test can  be used) for the duration of the COVID-19 declaration under Section 564(b)(1) of the Act, 21 U.S.C. section 360bbb-3(b)(1), unless the authorization is terminated or revoked sooner.    Influenza A by PCR NEGATIVE NEGATIVE Final   Influenza B by PCR NEGATIVE NEGATIVE Final    Comment: (NOTE) The Xpert Xpress SARS-CoV-2/FLU/RSV assay is intended as an aid in  the diagnosis of influenza from Nasopharyngeal swab specimens and  should not be used as a sole basis for treatment. Nasal washings and  aspirates are unacceptable for Xpert Xpress SARS-CoV-2/FLU/RSV  testing. Fact Sheet for Patients: PinkCheek.be Fact Sheet for Healthcare Providers: GravelBags.it This test is not yet approved or cleared by the [place] FDA and  has been authorized for detection and/or diagnosis of SARS-CoV-2 by  FDA under an Emergency Use Authorization (EUA). This EUA will remain  in effect (meaning this test can be used) for the duration of the  Covid-19 declaration under Section 564(b)(1) of the Act, 21  U.S.C. section 360bbb-3(b)(1), unless the authorization is  terminated or revoked. Performed at [facility], [place] [address] [place], [place] [address]      Labs: BNP (last 3 results) Recent Labs    02/19/19 0738 12/31/19 0432  BNP 238.0* [id number]*   Basic Metabolic Panel: Recent Labs  Lab 12/31/19 [id number] 01/01/20 0357 01/02/20 [id number] 01/02/20 [id number] 01/03/20 0345 01/03/20 0345 01/03/20 [id number] 01/03/20 0911 01/04/20 0341 01/05/20  [id number] 01/05/20 1025 01/06/20 0820  NA  --    < > 128*   < > 126*   < > 127* 126* 125* 124*  --  124*  K  --    < > 4.4   < > 4.2   < > 4.4 4.4 4.0 3.4*  --  4.2  CL  --    < > 96*  --  93*  --   --   --  92* 93*  --  94*  CO2  --    < > 23  --  21*  --   --   --  21* 24  --  25  GLUCOSE  --    < > 160*  --  129*  --   --   --  91 97  --  111*  BUN  --    < > 46*  --  44*  --   --   --  44* 40*  --  28*   CREATININE  --    < > 1.49*  --  1.17*  --   --   --  1.22* 1.17*  --  1.07*  CALCIUM  --    < > 8.5*  --  8.6*  --   --   --  8.2* 7.9*  --  7.9*  MG 1.8  --   --   --   --   --   --   --   --   --  1.9 2.7*   < > = values in this interval not displayed.   Liver Function Tests: Recent Labs  Lab 12/31/19 0939  AST 28  ALT 44  ALKPHOS 44  BILITOT 0.7  PROT 6.4*  ALBUMIN 3.2*   No results for input(s): LIPASE, AMYLASE in the last 168 hours. No results for input(s): AMMONIA in the last 168 hours. CBC: Recent Labs  Lab 12/31/19 0432 01/03/20 0345 01/03/20 0903 01/03/20 0911 01/04/20 0341  WBC 15.6*  --   --   --  7.9  NEUTROABS 10.6*  --   --   --   --   HGB 12.2 11.0* 10.2* 10.2* 11.2*  HCT 35.7* 30.2* 30.0* 30.0* 31.8*  MCV 103.5*  --   --   --  100.3*  PLT 267  --   --   --  219   Cardiac Enzymes: No results for input(s): CKTOTAL, CKMB, CKMBINDEX, TROPONINI in the last 168 hours. BNP: Invalid input(s): POCBNP CBG: No results for input(s): GLUCAP in the last 168 hours. D-Dimer No results for input(s): DDIMER in the last 72 hours. Hgb A1c No results for input(s): HGBA1C in the last 72 hours. Lipid Profile No results for input(s): CHOL, HDL, LDLCALC, TRIG, CHOLHDL, LDLDIRECT in the last 72 hours. Thyroid function studies No results for input(s): TSH, T4TOTAL, T3FREE, THYROIDAB in the last 72 hours.  Invalid input(s): FREET3 Anemia work up No results for input(s): VITAMINB12, FOLATE, FERRITIN, TIBC, IRON, RETICCTPCT in the last 72 hours. Urinalysis    Component Value Date/Time   COLORURINE YELLOW 03/12/2019 1148   APPEARANCEUR CLEAR 03/12/2019 1148   LABSPEC 1.003 (L) 03/12/2019 1148   PHURINE 7.0 03/12/2019 1148   GLUCOSEU NEGATIVE 03/12/2019 1148   HGBUR NEGATIVE 03/12/2019 1148   BILIRUBINUR NEGATIVE 03/12/2019 1148   KETONESUR NEGATIVE 03/12/2019 1148   PROTEINUR 30 (A) 03/12/2019 1148   UROBILINOGEN 0.2 09/22/2012 1100   NITRITE POSITIVE (A)  03/12/2019 1148  LEUKOCYTESUR MODERATE (A) 03/12/2019 1148   Sepsis Labs Invalid input(s): PROCALCITONIN,  WBC,  LACTICIDVEN Microbiology Recent Results (from the past 240 hour(s))  Respiratory Panel by RT PCR (Flu A&B, Covid) - Nasopharyngeal Swab     Status: None   Collection Time: 12/31/19  7:15 AM   Specimen: Nasopharyngeal Swab  Result Value Ref Range Status   SARS Coronavirus 2 by RT PCR NEGATIVE NEGATIVE Final    Comment: (NOTE) SARS-CoV-2 target nucleic acids are NOT DETECTED. The SARS-CoV-2 RNA is generally detectable in upper respiratoy specimens during the acute phase of infection. The lowest concentration of SARS-CoV-2 viral copies this assay can detect is 131 copies/mL. A negative result does not preclude SARS-Cov-2 infection and should not be used as the sole basis for treatment or other patient management decisions. A negative result may occur with  improper specimen collection/handling, submission of specimen other than nasopharyngeal swab, presence of viral mutation(s) within the areas targeted by this assay, and inadequate number of viral copies (<131 copies/mL). A negative result must be combined with clinical observations, patient history, and epidemiological information. The expected result is Negative. Fact Sheet for Patients:  PinkCheek.be Fact Sheet for Healthcare Providers:  GravelBags.it This test is not yet ap proved or cleared by the [place] FDA and  has been authorized for detection and/or diagnosis of SARS-CoV-2 by FDA under an Emergency Use Authorization (EUA). This EUA will remain  in effect (meaning this test can be used) for the duration of the COVID-19 declaration under Section 564(b)(1) of the Act, 21 U.S.C. section 360bbb-3(b)(1), unless the authorization is terminated or revoked sooner.    Influenza A by PCR NEGATIVE NEGATIVE Final   Influenza B by PCR NEGATIVE NEGATIVE Final     Comment: (NOTE) The Xpert Xpress SARS-CoV-2/FLU/RSV assay is intended as an aid in  the diagnosis of influenza from Nasopharyngeal swab specimens and  should not be used as a sole basis for treatment. Nasal washings and  aspirates are unacceptable for Xpert Xpress SARS-CoV-2/FLU/RSV  testing. Fact Sheet for Patients: PinkCheek.be Fact Sheet for Healthcare Providers: GravelBags.it This test is not yet approved or cleared by the [place] FDA and  has been authorized for detection and/or diagnosis of SARS-CoV-2 by  FDA under an Emergency Use Authorization (EUA). This EUA will remain  in effect (meaning this test can be used) for the duration of the  Covid-19 declaration under Section 564(b)(1) of the Act, 21  U.S.C. section 360bbb-3(b)(1), unless the authorization is  terminated or revoked. Performed at [facility], [place] [address], [place], [place] [address]      Time coordinating discharge:  39 minutes  SIGNED:   Georgette Shell, MD  [facility] 01/06/2020, 12:34 PM Pager   If 7PM-7AM, please contact night-coverage [email or web address] Password [email or web address]

## 2020-01-09 ENCOUNTER — Ambulatory Visit: Payer: PPO | Admitting: Podiatrist

## 2020-01-09 ENCOUNTER — Telehealth: Payer: Self-pay | Admitting: *Deleted

## 2020-01-09 NOTE — Telephone Encounter (Signed)
I have made the 1st attempt to contact the patient or family member in charge, in order to follow up from recently being discharged from the hospital. No Answer but I will make another attempt at a different time.        (Appointment on 01/16/20 already scheduled)

## 2020-01-10 DIAGNOSIS — N319 Neuromuscular dysfunction of bladder, unspecified: Secondary | ICD-10-CM | POA: Diagnosis not present

## 2020-01-10 DIAGNOSIS — I0981 Rheumatic heart failure: Secondary | ICD-10-CM | POA: Diagnosis not present

## 2020-01-10 DIAGNOSIS — M8580 Other specified disorders of bone density and structure, unspecified site: Secondary | ICD-10-CM | POA: Diagnosis not present

## 2020-01-10 DIAGNOSIS — J9601 Acute respiratory failure with hypoxia: Secondary | ICD-10-CM

## 2020-01-10 DIAGNOSIS — I6523 Occlusion and stenosis of bilateral carotid arteries: Secondary | ICD-10-CM | POA: Diagnosis not present

## 2020-01-10 DIAGNOSIS — I7300 Raynaud's syndrome without gangrene: Secondary | ICD-10-CM | POA: Diagnosis not present

## 2020-01-10 DIAGNOSIS — N183 Chronic kidney disease, stage 3 unspecified: Secondary | ICD-10-CM | POA: Diagnosis not present

## 2020-01-10 DIAGNOSIS — J441 Chronic obstructive pulmonary disease with (acute) exacerbation: Secondary | ICD-10-CM

## 2020-01-10 DIAGNOSIS — I4892 Unspecified atrial flutter: Secondary | ICD-10-CM | POA: Diagnosis not present

## 2020-01-10 DIAGNOSIS — F329 Major depressive disorder, single episode, unspecified: Secondary | ICD-10-CM | POA: Diagnosis not present

## 2020-01-10 DIAGNOSIS — D631 Anemia in chronic kidney disease: Secondary | ICD-10-CM

## 2020-01-10 DIAGNOSIS — I4891 Unspecified atrial fibrillation: Secondary | ICD-10-CM | POA: Diagnosis not present

## 2020-01-10 DIAGNOSIS — F419 Anxiety disorder, unspecified: Secondary | ICD-10-CM | POA: Diagnosis not present

## 2020-01-10 DIAGNOSIS — D7589 Other specified diseases of blood and blood-forming organs: Secondary | ICD-10-CM | POA: Diagnosis not present

## 2020-01-10 DIAGNOSIS — M5441 Lumbago with sciatica, right side: Secondary | ICD-10-CM | POA: Diagnosis not present

## 2020-01-10 DIAGNOSIS — M5417 Radiculopathy, lumbosacral region: Secondary | ICD-10-CM | POA: Diagnosis not present

## 2020-01-10 DIAGNOSIS — N179 Acute kidney failure, unspecified: Secondary | ICD-10-CM | POA: Diagnosis not present

## 2020-01-10 DIAGNOSIS — M810 Age-related osteoporosis without current pathological fracture: Secondary | ICD-10-CM | POA: Diagnosis not present

## 2020-01-10 DIAGNOSIS — M0600 Rheumatoid arthritis without rheumatoid factor, unspecified site: Secondary | ICD-10-CM | POA: Diagnosis not present

## 2020-01-10 DIAGNOSIS — G834 Cauda equina syndrome: Secondary | ICD-10-CM | POA: Diagnosis not present

## 2020-01-10 DIAGNOSIS — I130 Hypertensive heart and chronic kidney disease with heart failure and stage 1 through stage 4 chronic kidney disease, or unspecified chronic kidney disease: Secondary | ICD-10-CM | POA: Diagnosis not present

## 2020-01-10 DIAGNOSIS — I5043 Acute on chronic combined systolic (congestive) and diastolic (congestive) heart failure: Secondary | ICD-10-CM | POA: Diagnosis not present

## 2020-01-10 DIAGNOSIS — I447 Left bundle-branch block, unspecified: Secondary | ICD-10-CM | POA: Diagnosis not present

## 2020-01-10 DIAGNOSIS — I088 Other rheumatic multiple valve diseases: Secondary | ICD-10-CM

## 2020-01-10 DIAGNOSIS — M170 Bilateral primary osteoarthritis of knee: Secondary | ICD-10-CM | POA: Diagnosis not present

## 2020-01-10 NOTE — Telephone Encounter (Signed)
Transition Care Management Follow-up Telephone Call  Date of discharge and from where: 01/06/20 [facility]  How have you been since you were released from the hospital? alittle weak  Any questions or concerns? No   Items Reviewed:  Did the pt receive and understand the discharge instructions provided? Yes   Medications obtained and verified? Yes   Any new allergies since your discharge? No   Dietary orders reviewed? Yes  Do you have support at home? Yes   Caregiver, Patent examiner  Other (ie: DME, Home Health, etc) Home Health Ordered. Assessment today  Functional Questionnaire: (I = Independent and D = Dependent) ADL's: I with assistance  Bathing/Dressing- I with assistance   Meal Prep- D  Eating- I  Maintaining continence- I  Transferring/Ambulation- I with assistance  Managing Meds- D Caregiver places in Pill box   Follow up appointments reviewed:    PCP Hospital f/u appt confirmed? Yes  Scheduled to see Dr. Mariea Clonts on 01/16/20 Does not want to schedule a sooner appointment.  [facility] f/u appt confirmed? No    Are transportation arrangements needed? No   If their condition worsens, is the pt aware to call  their PCP or go to the ED? Yes  Was the patient provided with contact information for the PCP's office or ED? Yes  Was the pt encouraged to call back with questions or concerns? Yes

## 2020-01-12 ENCOUNTER — Telehealth: Payer: Self-pay | Admitting: *Deleted

## 2020-01-12 NOTE — Telephone Encounter (Signed)
Ok to enter these orders to print and fax to [place] as requested

## 2020-01-12 NOTE — Telephone Encounter (Signed)
Claiborne Billings with [name] called requesting Rx's to be written for DME Equipment, [place] with Seat and a Bedside Commode.  Wants orders to be faxed to [place] ([facility]) Fax: [phone] Please Advise.

## 2020-01-16 ENCOUNTER — Encounter: Payer: Self-pay | Admitting: Internal Medicine

## 2020-01-16 ENCOUNTER — Ambulatory Visit (INDEPENDENT_AMBULATORY_CARE_PROVIDER_SITE_OTHER): Payer: PPO | Admitting: Internal Medicine

## 2020-01-16 ENCOUNTER — Other Ambulatory Visit: Payer: Self-pay

## 2020-01-16 VITALS — BP 130/78 | HR 78 | Temp 97.7°F | Ht 65.0 in | Wt 95.4 lb

## 2020-01-16 DIAGNOSIS — F5105 Insomnia due to other mental disorder: Secondary | ICD-10-CM | POA: Diagnosis not present

## 2020-01-16 DIAGNOSIS — Z1159 Encounter for screening for other viral diseases: Secondary | ICD-10-CM | POA: Diagnosis not present

## 2020-01-16 DIAGNOSIS — K591 Functional diarrhea: Secondary | ICD-10-CM

## 2020-01-16 DIAGNOSIS — R2689 Other abnormalities of gait and mobility: Secondary | ICD-10-CM | POA: Diagnosis not present

## 2020-01-16 DIAGNOSIS — F411 Generalized anxiety disorder: Secondary | ICD-10-CM | POA: Diagnosis not present

## 2020-01-16 DIAGNOSIS — I5043 Acute on chronic combined systolic (congestive) and diastolic (congestive) heart failure: Secondary | ICD-10-CM | POA: Diagnosis not present

## 2020-01-16 DIAGNOSIS — Z23 Encounter for immunization: Secondary | ICD-10-CM | POA: Diagnosis not present

## 2020-01-16 DIAGNOSIS — Z716 Tobacco abuse counseling: Secondary | ICD-10-CM | POA: Diagnosis not present

## 2020-01-16 DIAGNOSIS — F409 Phobic anxiety disorder, unspecified: Secondary | ICD-10-CM

## 2020-01-16 DIAGNOSIS — F321 Major depressive disorder, single episode, moderate: Secondary | ICD-10-CM

## 2020-01-16 MED ORDER — MIRTAZAPINE 7.5 MG PO TABS: 7.5000 mg | ORAL_TABLET | Freq: Every day | ORAL | 5 refills | Status: DC

## 2020-01-16 NOTE — Progress Notes (Signed)
Location:  [facility] clinic Provider: Careli Luzader L. Mariea Clonts, D.O., C.M.D.  Code Status: full code Goals of Care:  Advanced Directives 01/16/2020  Does Patient Have a Medical Advance Directive? No  Type of Advance Directive Living will  Does patient want to make changes to medical advance directive? No - Patient declined  Copy of [place] in Chart? -  Would patient like information on creating a medical advance directive? -  Pre-existing out of facility DNR order (yellow form or pink MOST form) -     Chief Complaint  Patient presents with  . Transitions Of Care    discharge date  01/06/2020    HPI: Patient is a 74 y.o. female seen today for hospital follow-up s/p admission from 5/1 to 01/06/20 with shortness of breath. She was diagnosed with acute on chronic combined CHF.  BNP was 3381.  CXR showed pulmonary edema.  She was diuresed with lasix and aldactone.  She underwent cardiac cath which showed 50-70% LAD and diagonal bifurcation and diagonal 60% within region of heavy calcification.  EF was 40%.  Aldactone was added 12.5mg  daily.  Is on a 1200cc fluid restriction.  She likes to drink water and she has to take most of it in the morning b/c of all of her pills.    Weight here 94 but stable at 85-86 lbs at home.  Weight is up three lbs from when I saw her last.    Hyponatremia remained stable.    She had an infected bartholin's glad that was treated with keflex and warm soaks.  The bumps are now gone and she denies pain there now.    Nicotine patch prescribed for smoking cessation.    She had diarrhea which improved with probiotics.  Xanax was increased while there 0.5mg  po tid prn pain.  zoloft was decreased to 25mg  daily from 50mg  daily.  Anxiety was really bad yesterday--caregiver tried to get her to relax and imagine she was somewhere else.  Has her hands in fists and goes on about how she doesn't now what to do.  She's worked up like that about 4 days out of 7 days.   Takes first xanax at 7:30, another early afternoon, may or may not take the 3rd one.    Has the trazodone 50mg  for sleep, but it gives her nightmares.  Might sleep 2-3 hrs, but then feels like she's not sleeping at all.  They tried melatonin but did not help.    Says dyspnea is still there, but a lot better.  Oxygen level is good.  PT has her taking bigger steps.  She walks with the walker at home.    She says a rollator and bedside commode have been recommended.  She does not have anyone with her after 6pm.  She sometimes is incontinent of stool when she has loose bms and others constipation.    She is currently on nicoderm patch 7mg /24hr daily.  She's not having cravings.    Past Medical History:  Diagnosis Date  . AIN grade I   . Anemia   . Anxiety   . Bruises easily   . Chronic diarrhea   . Chronic hyponatremia   . Colitis   . COPD (chronic obstructive pulmonary disease) ([place])   . Depression   . GERD (gastroesophageal reflux disease)   . History of chronic bronchitis   . History of vulvar dysplasia    serveral recurrency's   . Hypertension   . Insomnia   .  LBBB (left bundle branch block) W/ PROLONGED PR   CARDIOLOGSIT-  DR Johnsie Cancel-   LOV IN EPIC  . Macrocytosis   . Major depressive disorder   . Nocturia   . Osteoporosis   . Raynaud's disease   . Sciatica    right side  . Seronegative rheumatoid arthritis ([place])   . Short of breath on exertion   . Smokers' cough ([place])   . Venous insufficiency of leg    , Edema  . VIN III (vulvar intraepithelial neoplasia III)    RECURRENT -  . Vitamin B12 deficiency     Past Surgical History:  Procedure Laterality Date  . APPENDECTOMY  1992  . BIOPSY  08/19/2019   Procedure: BIOPSY;  Surgeon: [name], MD;  Location: [facility];  Service: Gastroenterology;;  . CARDIOVASCULAR STRESS TEST  06-23-2012  DR Jenkins [place]   NORMAL LEXISCAN MYOVIEW/ EF 83%/  NO ISCHEMIA  . CO2 LASER APPLICATION  [id number]   Procedure: CO2 LASER  APPLICATION;  Surgeon: Janie Morning, MD PHD;  Location: [facility];  Service: Gynecology;  Laterality: N/A;  Laser Vaporization  . CO2 LASER APPLICATION N/A [id number]   Procedure: CO2 LASER APPLICATION;  Surgeon: [name], MD;  Location: [facility];  Service: Gynecology;  Laterality: N/A;  . CO2 LASER APPLICATION N/A [id number]   Procedure: CO2 LASER APPLICATION of the vulva;  Surgeon: Janie Morning, MD;  Location: [facility];  Service: Gynecology;  Laterality: N/A;  . COLONOSCOPY WITH PROPOFOL N/A 08/19/2019   Procedure: COLONOSCOPY WITH PROPOFOL;  Surgeon: [name], MD;  Location: [facility];  Service: Gastroenterology;  Laterality: N/A;  . ESOPHAGOGASTRODUODENOSCOPY (EGD) WITH PROPOFOL N/A 08/19/2019   Procedure: ESOPHAGOGASTRODUODENOSCOPY (EGD) WITH PROPOFOL;  Surgeon: [name], MD;  Location: [facility];  Service: Gastroenterology;  Laterality: N/A;  . EXCISION VULVAR LESIONS  07/2012  . HEMORRHOID SURGERY N/A 04/15/2013   Procedure: Excision of external and internal  hemorrhoid with AIN, Exam under anesthesia;  Surgeon: Odis Hollingshead, MD;  Location: [facility];  Service: General;  Laterality: N/A;  . HIP PINNING,CANNULATED Right 03/15/2019   Procedure: Maebelle Munroe OF FEMORAL NECK FRACTURE;  Surgeon: Renette Butters, MD;  Location: [facility];  Service: Orthopedics;  Laterality: Right;  . POLYPECTOMY  08/19/2019   Procedure: POLYPECTOMY;  Surgeon: [name], MD;  Location: [facility];  Service: Gastroenterology;;  . REPAIR FISTULA IN ANO/  I & D PERIRECTAL ABSCESS  10-26-2002  DR Johnathan Hausen  . RIGHT/LEFT HEART CATH AND CORONARY ANGIOGRAPHY N/A 01/03/2020   Procedure: RIGHT/LEFT HEART CATH AND CORONARY ANGIOGRAPHY;  Surgeon: Belva Crome, MD;  Location: [facility];  Service: Cardiovascular;  Laterality: N/A;  . TRANSTHORACIC ECHOCARDIOGRAM  06-23-2012   NORMAL LVSF/ EF 55-60%  . VAGINAL HYSTERECTOMY  1997   partial  .  VULVAR LESION REMOVAL N/A 11/03/2014   Procedure: CO2 LASER OF VULVAR ;  Surgeon: [name], MD;  Location: [facility];  Service: Gynecology;  Laterality: N/A;  . VULVECTOMY N/A 03/26/2015   Procedure: WIDE LOCA  EXCISION OF VULVAR;  Surgeon: [name], MD;  Location: [facility];  Service: Gynecology;  Laterality: N/A;  . Wide Local Excision of labia majora  04-04-2010   Right-sided lesion, CO2 ablation of right labia minora    Allergies  Allergen Reactions  . Latex Swelling and Other (See Comments)     fever blisters  . Augmentin [Amoxicillin-Pot Clavulanate] Nausea And Vomiting  .  Doxycycline Other (See Comments)    Blurred vision, nervous, unsteady  . Hydrocodone Itching    Acetaminophen:itching, confusion, hallucinations-side effects-Onset date 07/15/2018  . Lactose Intolerance (Gi) Other (See Comments)    Unknown, per pt  . Lialda [Mesalamine] Diarrhea  . Prinivil [Lisinopril] Diarrhea  . Triamcinolone Acetonide Other (See Comments)    REDNESS AND PAIN  . Amlodipine Rash  . Lotensin [Benazepril Hcl] Rash    Outpatient Encounter Medications as of 01/16/2020  Medication Sig  . acetaminophen (TYLENOL) 325 MG tablet Take 650 mg by mouth every 6 (six) hours as needed for mild pain.   Marland Kitchen ALPRAZolam (XANAX) 0.5 MG tablet Take 1 tablet (0.5 mg total) by mouth 3 (three) times daily as needed for anxiety.  Marland Kitchen aspirin 81 MG chewable tablet Chew 1 tablet (81 mg total) by mouth daily.  Marland Kitchen atorvastatin (LIPITOR) 80 MG tablet Take 1 tablet (80 mg total) by mouth daily.  . busPIRone (BUSPAR) 30 MG tablet Take 30 mg by mouth 2 (two) times daily.   . calcium carbonate (OSCAL) 1500 (600 Ca) MG TABS tablet Take 600 mg of elemental calcium by mouth daily.  . carvedilol (COREG) 12.5 MG tablet Take 1 tablet (12.5 mg total) by mouth 2 (two) times daily with a meal.  . denosumab (PROLIA) 60 MG/ML SOSY injection Inject 60 mg into the skin every 6 (six) months.  . ferrous  sulfate 325 (65 FE) MG tablet Take 325 mg by mouth daily with breakfast.  . furosemide (LASIX) 40 MG tablet Take 1 tablet (40 mg total) by mouth daily.  . hydrALAZINE (APRESOLINE) 25 MG tablet Take 1 tablet (25 mg total) by mouth every 8 (eight) hours.  . hydroxychloroquine (PLAQUENIL) 200 MG tablet Take 200 mg by mouth daily.  Marland Kitchen loperamide (IMODIUM) 2 MG capsule Take 1 capsule (2 mg total) by mouth every 6 (six) hours as needed for diarrhea or loose stools.  . Multiple Vitamin (MULTIVITAMIN WITH MINERALS) TABS tablet Take 1 tablet by mouth daily.  . nicotine (NICODERM CQ - DOSED IN MG/24 HR) 7 mg/24hr patch Place 1 patch (7 mg total) onto the skin daily.  . polyethylene glycol (MIRALAX / GLYCOLAX) 17 g packet Take 17 g by mouth daily as needed for mild constipation.   . predniSONE (DELTASONE) 5 MG tablet Take 5 mg by mouth daily.  . Rivaroxaban (XARELTO) 15 MG TABS tablet Take 1 tablet (15 mg total) by mouth daily with supper.  . saccharomyces boulardii (FLORASTOR) 250 MG capsule Take 1 capsule (250 mg total) by mouth 2 (two) times daily.  . sertraline (ZOLOFT) 25 MG tablet Take 1 tablet (25 mg total) by mouth daily.  Marland Kitchen spironolactone (ALDACTONE) 25 MG tablet Take 0.5 tablets (12.5 mg total) by mouth daily.   No facility-administered encounter medications on file as of 01/16/2020.    Review of Systems:  Review of Systems  Constitutional: Positive for malaise/fatigue. Negative for chills and fever.  HENT: Positive for hearing loss.   Eyes: Negative for blurred vision.  Respiratory: Positive for shortness of breath. Negative for cough and wheezing.        Improving  Cardiovascular: Negative for chest pain, palpitations and leg swelling.  Gastrointestinal: Negative for constipation.  Genitourinary: Negative for dysuria.  Musculoskeletal: Negative for falls.  Skin: Negative for itching and rash.       Bartholin's gland cyst resolved  Neurological: Positive for weakness. Negative for  dizziness and loss of consciousness.       Is back  to ambulating without assistive device some  Endo/Heme/Allergies: Bruises/bleeds easily.  Psychiatric/Behavioral: Positive for depression. The patient is nervous/anxious.     Health Maintenance  Topic Date Due  . Hepatitis C Screening  Never done  . PNA vac Low Risk Adult (1 of 2 - PCV13) Never done  . INFLUENZA VACCINE  04/01/2020  . MAMMOGRAM  09/19/2021  . TETANUS/TDAP  09/11/2028  . COLONOSCOPY  08/18/2029  . DEXA SCAN  Completed  . COVID-19 Vaccine  Completed    Physical Exam: Vitals:   01/16/20 1319  Weight: 95 lb 6.4 oz (43.3 kg)  Height: 5\' 5"  (1.651 m)   Body mass index is 15.88 kg/m. Physical Exam Vitals reviewed.  Constitutional:      General: She is not in acute distress.    Appearance: Normal appearance. She is not toxic-appearing.     Comments: Frail female in NAD  HENT:     Head: Normocephalic and atraumatic.  Eyes:     Comments: glasses  Cardiovascular:     Rate and Rhythm: Normal rate and regular rhythm.     Pulses: Normal pulses.     Heart sounds: Normal heart sounds.  Pulmonary:     Effort: Pulmonary effort is normal.     Breath sounds: Wheezing present.  Abdominal:     General: Bowel sounds are normal.     Palpations: Abdomen is soft.     Tenderness: There is no abdominal tenderness.  Musculoskeletal:        General: Normal range of motion.     Right lower leg: No edema.     Left lower leg: No edema.  Skin:    General: Skin is warm and dry.     Coloration: Skin is pale.  Neurological:     General: No focal deficit present.     Mental Status: She is alert and oriented to person, place, and time.  Psychiatric:        Mood and Affect: Mood normal.        Behavior: Behavior normal.     Labs reviewed: Basic Metabolic Panel: Recent Labs    02/19/19 0935 02/19/19 1507 03/13/19 0246 12/31/19 [id number] 12/31/19 0939 01/01/20 0357 01/04/20 0341 01/05/20 0248 01/05/20 1025 01/06/20 0820    NA  --    < >  --   --   --    < > 125* 124*  --  124*  K  --    < >  --   --   --    < > 4.0 3.4*  --  4.2  CL  --    < >  --   --   --    < > 92* 93*  --  94*  CO2  --    < >  --   --   --    < > 21* 24  --  25  GLUCOSE  --    < >  --   --   --    < > 91 97  --  111*  BUN  --    < >  --   --   --    < > 44* 40*  --  28*  CREATININE  --    < >  --   --   --    < > 1.22* 1.17*  --  1.07*  CALCIUM  --    < >  --   --   --    < >  8.2* 7.9*  --  7.9*  MG  --    < >   < >  --  1.8  --   --   --  1.9 2.7*  TSH 0.944  --   --  2.889  --   --   --   --   --   --    < > = values in this interval not displayed.   Liver Function Tests: Recent Labs    08/17/19 1504 12/31/19 0939  AST 23 28  ALT 26 44  ALKPHOS 41 44  BILITOT 0.5 0.7  PROT 6.5 6.4*  ALBUMIN 3.6 3.2*   No results for input(s): LIPASE, AMYLASE in the last 8760 hours. No results for input(s): AMMONIA in the last 8760 hours. CBC: Recent Labs    03/29/19 0000 04/26/19 0000 08/17/19 1504 08/17/19 1855 08/21/19 0719 08/21/19 0719 12/31/19 0432 01/03/20 0345 01/03/20 0903 01/03/20 0911 01/04/20 0341  WBC 5.8   < > 9.4   < > 6.9  --  15.6*  --   --   --  7.9  NEUTROABS 3  --  8.4*  --   --   --  10.6*  --   --   --   --   HGB 9.9*   < > 9.9*   < > 9.7*   < > 12.2   < > 10.2* 10.2* 11.2*  HCT 28*   < > 28.7*   < > 26.1*   < > 35.7*   < > 30.0* 30.0* 31.8*  MCV  --   --  107.9*   < > 99.6  --  103.5*  --   --   --  100.3*  PLT 251   < > 236   < > 232  --  267  --   --   --  219   < > = values in this interval not displayed.   Lipid Panel: No results for input(s): CHOL, HDL, LDLCALC, TRIG, CHOLHDL, LDLDIRECT in the last 8760 hours. Lab Results  Component Value Date   HGBA1C 5.4 09/24/2017    Procedures since last visit: DG Chest Port 1 View  Result Date: 12/31/2019 CLINICAL DATA:  Shortness of breath EXAM: PORTABLE CHEST 1 VIEW COMPARISON:  03/12/2019 FINDINGS: The lungs are hyperinflated with diffuse interstitial  prominence. No pleural effusion or pneumothorax. Normal cardiomediastinal contours. Reticular opacities at both lung bases. IMPRESSION: COPD without focal airspace disease. Reticular opacities at both lung bases may indicate pulmonary edema or developing infection. Electronically Signed   By: Ulyses Jarred M.D.   On: 12/31/2019 05:26   ECHOCARDIOGRAM COMPLETE  Result Date: 01/01/2020    ECHOCARDIOGRAM REPORT   Patient Name:   Adjoa DRAPER Beirne Date of Exam: 01/01/2020 Medical Rec #:  [id number]          Height:       64.0 in Accession #:    [id number]         Weight:       94.6 lb Date of Birth:  Oct 14, 1945          BSA:          1.423 m Patient Age:    74 years           BP:           140/73 mmHg Patient Gender: F                  HR:  72 bpm. Exam Location:  Inpatient Procedure: 2D Echo, Cardiac Doppler and Color Doppler Indications:    CHF-Acute Diastolic  History:        Patient has prior history of Echocardiogram examinations, most                 recent 09/24/2017. COPD, Arrythmias:LBBB,                 Signs/Symptoms:Shortness of Breath; Risk Factors:Current Smoker,                 Hypertension and Dyslipidemia.  Sonographer:    Clayton Lefort RDCS (AE) Referring Phys: [id number] RONDELL A SMITH IMPRESSIONS  1. Moderate to severe global reduction in LV systolic function; grade 1 diastolic dysfunction; moderate LVH; moderate MR; severe LAE.  2. Left ventricular ejection fraction, by estimation, is 30 to 35%. The left ventricle has moderately decreased function. The left ventricle demonstrates global hypokinesis. There is moderate left ventricular hypertrophy. Left ventricular diastolic parameters are consistent with Grade I diastolic dysfunction (impaired relaxation).  3. Right ventricular systolic function is normal. The right ventricular size is normal.  4. Left atrial size was severely dilated.  5. The mitral valve is normal in structure. Moderate mitral valve regurgitation. No evidence of mitral  stenosis.  6. The aortic valve is tricuspid. Aortic valve regurgitation is not visualized. Mild aortic valve sclerosis is present, with no evidence of aortic valve stenosis.  7. The inferior vena cava is normal in size with greater than 50% respiratory variability, suggesting right atrial pressure of 3 mmHg. FINDINGS  Left Ventricle: Left ventricular ejection fraction, by estimation, is 30 to 35%. The left ventricle has moderately decreased function. The left ventricle demonstrates global hypokinesis. The left ventricular internal cavity size was normal in size. There is moderate left ventricular hypertrophy. Left ventricular diastolic parameters are consistent with Grade I diastolic dysfunction (impaired relaxation). Right Ventricle: The right ventricular size is normal. Right ventricular systolic function is normal. There is normal pulmonary artery systolic pressure. The tricuspid regurgitant velocity is 2.57 m/s, and with an assumed right atrial pressure of 3 mmHg,  the estimated right ventricular systolic pressure is [id number]. Left Atrium: Left atrial size was severely dilated. Right Atrium: Right atrial size was normal in size. Pericardium: There is no evidence of pericardial effusion. Mitral Valve: The mitral valve is normal in structure. Normal mobility of the mitral valve leaflets. Moderate mitral valve regurgitation. No evidence of mitral valve stenosis. MV peak gradient, 5.8 mmHg. The mean mitral valve gradient is 2.0 mmHg. Tricuspid Valve: The tricuspid valve is normal in structure. Tricuspid valve regurgitation is mild . No evidence of tricuspid stenosis. Aortic Valve: The aortic valve is tricuspid. Aortic valve regurgitation is not visualized. Mild aortic valve sclerosis is present, with no evidence of aortic valve stenosis. Aortic valve mean gradient measures 3.0 mmHg. Aortic valve peak gradient measures 4.3 mmHg. Aortic valve area, by VTI measures 2.13 cm. Pulmonic Valve: The pulmonic valve was not  well visualized. Pulmonic valve regurgitation is mild. No evidence of pulmonic stenosis. Aorta: The aortic root is normal in size and structure. Venous: The inferior vena cava is normal in size with greater than 50% respiratory variability, suggesting right atrial pressure of 3 mmHg. IAS/Shunts: No atrial level shunt detected by color flow Doppler. Additional Comments: Moderate to severe global reduction in LV systolic function; grade 1 diastolic dysfunction; moderate LVH; moderate MR; severe LAE.  LEFT VENTRICLE PLAX 2D LVIDd:  4.70 cm LVIDs:         3.80 cm LV PW:         1.40 cm LV IVS:        1.30 cm LVOT diam:     1.70 cm LV SV:         50 LV SV Index:   35 LVOT Area:     2.27 cm  LV Volumes (MOD) LV vol d, MOD A2C: 84.5 ml LV vol d, MOD A4C: 99.0 ml LV vol s, MOD A2C: 57.8 ml LV vol s, MOD A4C: 66.8 ml LV SV MOD A2C:     26.7 ml LV SV MOD A4C:     99.0 ml LV SV MOD BP:      29.8 ml RIGHT VENTRICLE            IVC RV Basal diam:  2.50 cm    IVC diam: 2.00 cm RV S prime:     9.36 cm/s TAPSE (M-mode): 2.2 cm LEFT ATRIUM             Index       RIGHT ATRIUM           Index LA diam:        4.20 cm 2.95 cm/m  RA Area:     12.70 cm LA Vol (A2C):   70.1 ml 49.26 ml/m RA Volume:   26.60 ml  18.69 ml/m LA Vol (A4C):   90.6 ml 63.67 ml/m LA Biplane Vol: 82.6 ml 58.04 ml/m  AORTIC VALVE AV Area (Vmax):    2.12 cm AV Area (Vmean):   1.88 cm AV Area (VTI):     2.13 cm AV Vmax:           104.00 cm/s AV Vmean:          79.600 cm/s AV VTI:            0.237 m AV Peak Grad:      4.3 mmHg AV Mean Grad:      3.0 mmHg LVOT Vmax:         97.30 cm/s LVOT Vmean:        66.100 cm/s LVOT VTI:          0.222 m LVOT/AV VTI ratio: 0.94  AORTA Ao Root diam: 3.00 cm Ao Asc diam:  3.00 cm MITRAL VALVE                 TRICUSPID VALVE MV Area (PHT): 4.10 cm      TR Peak grad:   26.4 mmHg MV Peak grad:  5.8 mmHg      TR Vmax:        257.00 cm/s MV Mean grad:  2.0 mmHg MV Vmax:       1.20 m/s      SHUNTS MV Vmean:      70.3 cm/s      Systemic VTI:  0.22 m MV Decel Time: 185 msec      Systemic Diam: 1.70 cm MR Peak grad:    109.4 mmHg MR Mean grad:    64.0 mmHg MR Vmax:         523.00 cm/s MR Vmean:        369.0 cm/s MR PISA:         3.08 cm MR PISA Eff ROA: 18 mm MR PISA Radius:  0.70 cm MV E velocity: 101.00 cm/s MV A velocity: 118.00 cm/s MV E/A ratio:  0.86 Kirk Ruths MD Electronically  signed by Kirk Ruths MD Signature Date/Time: 01/01/2020/3:56:00 PM    Final     Assessment/Plan 1. Acute on chronic combined systolic and diastolic CHF (congestive heart failure) (HCC) - weight down and she's stable now, cont daily weights, fluid and sodium restrictions, monitor for dyspnea and edema, call for changes - CBC with Differential/Platelet; Future - COMPLETE METABOLIC PANEL WITH GFR; Future - TSH; Future  2. Depression, major, single episode, moderate ([place]) - we opted to add remeron for sleep, appetite and mood  - mirtazapine (REMERON) 7.5 MG tablet; Take 1 tablet (7.5 mg total) by mouth at bedtime.  Dispense: 30 tablet; Refill: 5  3. Generalized anxiety disorder -continues buspar and xanax and still anxious and depressed - mirtazapine (REMERON) 7.5 MG tablet; Take 1 tablet (7.5 mg total) by mouth at bedtime.  Dispense: 30 tablet; Refill: 5 - TSH; Future  4. Insomnia due to anxiety and fear -hoping to hit a few birds with one stone here - mirtazapine (REMERON) 7.5 MG tablet; Take 1 tablet (7.5 mg total) by mouth at bedtime.  Dispense: 30 tablet; Refill: 5  5. Need for assistance due to unsteady gait - cont home health and caregivers - DME Bedside commode due to challenges making it in time at night - For home use only DME 4 wheeled rolling walker with seat ([id number])--needs new walker  6. Functional diarrhea - having challenges making it to the restroom in time even though it's not far--hoping bedside commode will help prevent incontinence to some extent and falls b/c she wants to clean up and high risk to  fall - DME Bedside commode - COMPLETE METABOLIC PANEL WITH GFR; Future  7. Need for vaccination with 13-polyvalent pneumococcal conjugate vaccine -prevnar given now that through with covid series  8. Tobacco abuse counseling -discussed smoking cessation--she's really smoking only a few cigarettes and recommended cutting down by one every few weeks  9. Encounter for hepatitis C screening test for low risk patient - Hepatitis C antibody; Future   Labs/tests ordered:   Lab Orders     CBC with Differential/Platelet     COMPLETE METABOLIC PANEL WITH GFR     TSH     Hepatitis C antibody    Next appt:  04/19/2020   Deana Krock L. Vernon Ariel, D.O. [facility] [address], [place] [address] Cell Phone (Mon-Fri 8am-5pm):  [phone] On Call:  [phone] & follow prompts after 5pm & weekends Office Phone:  [phone] Office Fax:  [phone]

## 2020-01-16 NOTE — Telephone Encounter (Signed)
Faxed order to  [facility] ([facility] fax [phone]

## 2020-01-16 NOTE — Patient Instructions (Signed)
Stop zoloft and trazodone. Start remeron 7.5mg  at bedtime for depression, anxiety, poor sleep and loss of appetite.

## 2020-01-17 DIAGNOSIS — N183 Chronic kidney disease, stage 3 unspecified: Secondary | ICD-10-CM | POA: Diagnosis not present

## 2020-01-17 DIAGNOSIS — I447 Left bundle-branch block, unspecified: Secondary | ICD-10-CM | POA: Diagnosis not present

## 2020-01-17 DIAGNOSIS — M8580 Other specified disorders of bone density and structure, unspecified site: Secondary | ICD-10-CM | POA: Diagnosis not present

## 2020-01-17 DIAGNOSIS — I130 Hypertensive heart and chronic kidney disease with heart failure and stage 1 through stage 4 chronic kidney disease, or unspecified chronic kidney disease: Secondary | ICD-10-CM | POA: Diagnosis not present

## 2020-01-17 DIAGNOSIS — M810 Age-related osteoporosis without current pathological fracture: Secondary | ICD-10-CM | POA: Diagnosis not present

## 2020-01-17 DIAGNOSIS — M5441 Lumbago with sciatica, right side: Secondary | ICD-10-CM | POA: Diagnosis not present

## 2020-01-17 DIAGNOSIS — I5043 Acute on chronic combined systolic (congestive) and diastolic (congestive) heart failure: Secondary | ICD-10-CM | POA: Diagnosis not present

## 2020-01-17 DIAGNOSIS — D7589 Other specified diseases of blood and blood-forming organs: Secondary | ICD-10-CM | POA: Diagnosis not present

## 2020-01-17 DIAGNOSIS — F419 Anxiety disorder, unspecified: Secondary | ICD-10-CM | POA: Diagnosis not present

## 2020-01-17 DIAGNOSIS — M0600 Rheumatoid arthritis without rheumatoid factor, unspecified site: Secondary | ICD-10-CM | POA: Diagnosis not present

## 2020-01-17 DIAGNOSIS — G834 Cauda equina syndrome: Secondary | ICD-10-CM | POA: Diagnosis not present

## 2020-01-17 DIAGNOSIS — N179 Acute kidney failure, unspecified: Secondary | ICD-10-CM | POA: Diagnosis not present

## 2020-01-17 DIAGNOSIS — I7300 Raynaud's syndrome without gangrene: Secondary | ICD-10-CM | POA: Diagnosis not present

## 2020-01-17 DIAGNOSIS — F329 Major depressive disorder, single episode, unspecified: Secondary | ICD-10-CM | POA: Diagnosis not present

## 2020-01-17 DIAGNOSIS — I0981 Rheumatic heart failure: Secondary | ICD-10-CM | POA: Diagnosis not present

## 2020-01-17 DIAGNOSIS — M170 Bilateral primary osteoarthritis of knee: Secondary | ICD-10-CM | POA: Diagnosis not present

## 2020-01-17 DIAGNOSIS — D631 Anemia in chronic kidney disease: Secondary | ICD-10-CM | POA: Diagnosis not present

## 2020-01-17 DIAGNOSIS — N319 Neuromuscular dysfunction of bladder, unspecified: Secondary | ICD-10-CM | POA: Diagnosis not present

## 2020-01-17 DIAGNOSIS — M5417 Radiculopathy, lumbosacral region: Secondary | ICD-10-CM | POA: Diagnosis not present

## 2020-01-17 DIAGNOSIS — I4891 Unspecified atrial fibrillation: Secondary | ICD-10-CM | POA: Diagnosis not present

## 2020-01-17 DIAGNOSIS — J441 Chronic obstructive pulmonary disease with (acute) exacerbation: Secondary | ICD-10-CM | POA: Diagnosis not present

## 2020-01-17 DIAGNOSIS — I4892 Unspecified atrial flutter: Secondary | ICD-10-CM | POA: Diagnosis not present

## 2020-01-17 DIAGNOSIS — J9601 Acute respiratory failure with hypoxia: Secondary | ICD-10-CM | POA: Diagnosis not present

## 2020-01-17 DIAGNOSIS — I088 Other rheumatic multiple valve diseases: Secondary | ICD-10-CM | POA: Diagnosis not present

## 2020-01-17 DIAGNOSIS — I6523 Occlusion and stenosis of bilateral carotid arteries: Secondary | ICD-10-CM | POA: Diagnosis not present

## 2020-01-24 DIAGNOSIS — R269 Unspecified abnormalities of gait and mobility: Secondary | ICD-10-CM | POA: Diagnosis not present

## 2020-01-31 DIAGNOSIS — J9601 Acute respiratory failure with hypoxia: Secondary | ICD-10-CM | POA: Diagnosis not present

## 2020-01-31 DIAGNOSIS — N319 Neuromuscular dysfunction of bladder, unspecified: Secondary | ICD-10-CM | POA: Diagnosis not present

## 2020-01-31 DIAGNOSIS — M170 Bilateral primary osteoarthritis of knee: Secondary | ICD-10-CM | POA: Diagnosis not present

## 2020-01-31 DIAGNOSIS — J441 Chronic obstructive pulmonary disease with (acute) exacerbation: Secondary | ICD-10-CM | POA: Diagnosis not present

## 2020-01-31 DIAGNOSIS — D7589 Other specified diseases of blood and blood-forming organs: Secondary | ICD-10-CM | POA: Diagnosis not present

## 2020-01-31 DIAGNOSIS — I4892 Unspecified atrial flutter: Secondary | ICD-10-CM | POA: Diagnosis not present

## 2020-01-31 DIAGNOSIS — N179 Acute kidney failure, unspecified: Secondary | ICD-10-CM | POA: Diagnosis not present

## 2020-01-31 DIAGNOSIS — I4891 Unspecified atrial fibrillation: Secondary | ICD-10-CM | POA: Diagnosis not present

## 2020-01-31 DIAGNOSIS — I5043 Acute on chronic combined systolic (congestive) and diastolic (congestive) heart failure: Secondary | ICD-10-CM | POA: Diagnosis not present

## 2020-01-31 DIAGNOSIS — I7300 Raynaud's syndrome without gangrene: Secondary | ICD-10-CM | POA: Diagnosis not present

## 2020-01-31 DIAGNOSIS — M5441 Lumbago with sciatica, right side: Secondary | ICD-10-CM | POA: Diagnosis not present

## 2020-01-31 DIAGNOSIS — I6523 Occlusion and stenosis of bilateral carotid arteries: Secondary | ICD-10-CM | POA: Diagnosis not present

## 2020-01-31 DIAGNOSIS — I130 Hypertensive heart and chronic kidney disease with heart failure and stage 1 through stage 4 chronic kidney disease, or unspecified chronic kidney disease: Secondary | ICD-10-CM | POA: Diagnosis not present

## 2020-01-31 DIAGNOSIS — M8580 Other specified disorders of bone density and structure, unspecified site: Secondary | ICD-10-CM | POA: Diagnosis not present

## 2020-01-31 DIAGNOSIS — N183 Chronic kidney disease, stage 3 unspecified: Secondary | ICD-10-CM | POA: Diagnosis not present

## 2020-01-31 DIAGNOSIS — G834 Cauda equina syndrome: Secondary | ICD-10-CM | POA: Diagnosis not present

## 2020-01-31 DIAGNOSIS — F329 Major depressive disorder, single episode, unspecified: Secondary | ICD-10-CM | POA: Diagnosis not present

## 2020-01-31 DIAGNOSIS — D631 Anemia in chronic kidney disease: Secondary | ICD-10-CM | POA: Diagnosis not present

## 2020-01-31 DIAGNOSIS — I0981 Rheumatic heart failure: Secondary | ICD-10-CM | POA: Diagnosis not present

## 2020-01-31 DIAGNOSIS — M5417 Radiculopathy, lumbosacral region: Secondary | ICD-10-CM | POA: Diagnosis not present

## 2020-01-31 DIAGNOSIS — I447 Left bundle-branch block, unspecified: Secondary | ICD-10-CM | POA: Diagnosis not present

## 2020-01-31 DIAGNOSIS — M0600 Rheumatoid arthritis without rheumatoid factor, unspecified site: Secondary | ICD-10-CM | POA: Diagnosis not present

## 2020-01-31 DIAGNOSIS — F419 Anxiety disorder, unspecified: Secondary | ICD-10-CM | POA: Diagnosis not present

## 2020-01-31 DIAGNOSIS — I088 Other rheumatic multiple valve diseases: Secondary | ICD-10-CM | POA: Diagnosis not present

## 2020-01-31 DIAGNOSIS — M810 Age-related osteoporosis without current pathological fracture: Secondary | ICD-10-CM | POA: Diagnosis not present

## 2020-02-06 ENCOUNTER — Telehealth: Payer: Self-pay

## 2020-02-06 NOTE — Telephone Encounter (Signed)
Outside of weight gain, is she more short of breath? Having more edema?  She had been stable on the recordings at home at her visit with me so I did not change the diuretics at that time.   I did start her on remeron also to try to improve appetite thought I would not expect dramatic changes in weight like this from this med, just gradual. If she is having more edema or shortness of breath, let's increase the aldactone back to 25mg  daily from 12.5mg  daily.   Continue daily weights and call if trend persists.

## 2020-02-06 NOTE — Telephone Encounter (Signed)
Sreehesh, PT from [facility], called and stated that patient's weight has steadily been increasing. On 6/3 it was 98, today it was 100.       01/16/20 12/12/19 11/07/19                           Weight 95 lb 6.4 oz (43.3 kg) 90 lb 12.8 oz (41.2 kg) 91 lb (41.3 kg)

## 2020-02-07 NOTE — Telephone Encounter (Signed)
Did we also make the medication change with the aldactone?  (Just clarifying)

## 2020-02-07 NOTE — Telephone Encounter (Signed)
I did not tell her to change the medication at this time as the edema/SOB were not concerning at this time.  I did tell her that we might have to adjust the medication if sx persist.  The aldactone dose remains the same at this time.

## 2020-02-07 NOTE — Telephone Encounter (Signed)
I talked with Anne Ng, caregiver. She stated that she had some slight edema yesterday but today there is no edema. She does have some slight SOB, only after she has been up walking for a period of time, no SOB at rest or with minimal exertion. She states that patient is eating larger meals and thought at least some of the weight gain could be from the mirtazapine.   Asked caregiver to continue to monitor weight and edema/SOB and to contact the office if the trends persist. CG expressed understanding.

## 2020-02-08 ENCOUNTER — Telehealth: Payer: Self-pay | Admitting: Cardiology

## 2020-02-08 NOTE — Telephone Encounter (Signed)
New message   Per Alma Downs wants to know if need to be on a fluid restriction. Please advise.

## 2020-02-08 NOTE — Telephone Encounter (Signed)
Fluid restrict 1-1.5L per day Regina Furbish, MD

## 2020-02-08 NOTE — Telephone Encounter (Signed)
I spoke with Alma Downs and gave her information from Dr Marlou Porch.  She will let patient know

## 2020-02-09 ENCOUNTER — Emergency Department (HOSPITAL_COMMUNITY): Payer: PPO

## 2020-02-09 ENCOUNTER — Other Ambulatory Visit: Payer: Self-pay

## 2020-02-09 ENCOUNTER — Encounter (HOSPITAL_COMMUNITY): Payer: Self-pay

## 2020-02-09 ENCOUNTER — Ambulatory Visit: Payer: PPO | Admitting: Cardiology

## 2020-02-09 ENCOUNTER — Inpatient Hospital Stay (HOSPITAL_COMMUNITY)
Admission: EM | Admit: 2020-02-09 | Discharge: 2020-02-13 | DRG: 377 | Disposition: A | Discharge status: Home Health | Payer: PPO | Attending: Family Medicine | Admitting: Family Medicine

## 2020-02-09 DIAGNOSIS — J449 Chronic obstructive pulmonary disease, unspecified: Secondary | ICD-10-CM | POA: Diagnosis present

## 2020-02-09 DIAGNOSIS — K922 Gastrointestinal hemorrhage, unspecified: Secondary | ICD-10-CM | POA: Diagnosis present

## 2020-02-09 DIAGNOSIS — E871 Hypo-osmolality and hyponatremia: Secondary | ICD-10-CM | POA: Diagnosis present

## 2020-02-09 DIAGNOSIS — I447 Left bundle-branch block, unspecified: Secondary | ICD-10-CM | POA: Diagnosis not present

## 2020-02-09 DIAGNOSIS — Z7952 Long term (current) use of systemic steroids: Secondary | ICD-10-CM

## 2020-02-09 DIAGNOSIS — L89152 Pressure ulcer of sacral region, stage 2: Secondary | ICD-10-CM | POA: Diagnosis not present

## 2020-02-09 DIAGNOSIS — Z87412 Personal history of vulvar dysplasia: Secondary | ICD-10-CM

## 2020-02-09 DIAGNOSIS — Z7901 Long term (current) use of anticoagulants: Secondary | ICD-10-CM | POA: Diagnosis not present

## 2020-02-09 DIAGNOSIS — K449 Diaphragmatic hernia without obstruction or gangrene: Secondary | ICD-10-CM | POA: Diagnosis present

## 2020-02-09 DIAGNOSIS — K5900 Constipation, unspecified: Secondary | ICD-10-CM | POA: Diagnosis not present

## 2020-02-09 DIAGNOSIS — F329 Major depressive disorder, single episode, unspecified: Secondary | ICD-10-CM | POA: Diagnosis not present

## 2020-02-09 DIAGNOSIS — Z91040 Latex allergy status: Secondary | ICD-10-CM

## 2020-02-09 DIAGNOSIS — K921 Melena: Principal | ICD-10-CM | POA: Diagnosis present

## 2020-02-09 DIAGNOSIS — I7300 Raynaud's syndrome without gangrene: Secondary | ICD-10-CM | POA: Diagnosis present

## 2020-02-09 DIAGNOSIS — R0902 Hypoxemia: Secondary | ICD-10-CM | POA: Diagnosis not present

## 2020-02-09 DIAGNOSIS — I776 Arteritis, unspecified: Secondary | ICD-10-CM | POA: Diagnosis not present

## 2020-02-09 DIAGNOSIS — K219 Gastro-esophageal reflux disease without esophagitis: Secondary | ICD-10-CM | POA: Diagnosis present

## 2020-02-09 DIAGNOSIS — I482 Chronic atrial fibrillation, unspecified: Secondary | ICD-10-CM | POA: Diagnosis present

## 2020-02-09 DIAGNOSIS — T501X5A Adverse effect of loop [high-ceiling] diuretics, initial encounter: Secondary | ICD-10-CM | POA: Diagnosis present

## 2020-02-09 DIAGNOSIS — I130 Hypertensive heart and chronic kidney disease with heart failure and stage 1 through stage 4 chronic kidney disease, or unspecified chronic kidney disease: Secondary | ICD-10-CM | POA: Diagnosis not present

## 2020-02-09 DIAGNOSIS — Z720 Tobacco use: Secondary | ICD-10-CM | POA: Diagnosis present

## 2020-02-09 DIAGNOSIS — F32A Depression, unspecified: Secondary | ICD-10-CM | POA: Diagnosis present

## 2020-02-09 DIAGNOSIS — Z888 Allergy status to other drugs, medicaments and biological substances status: Secondary | ICD-10-CM

## 2020-02-09 DIAGNOSIS — F419 Anxiety disorder, unspecified: Secondary | ICD-10-CM | POA: Diagnosis not present

## 2020-02-09 DIAGNOSIS — D7589 Other specified diseases of blood and blood-forming organs: Secondary | ICD-10-CM | POA: Diagnosis present

## 2020-02-09 DIAGNOSIS — N179 Acute kidney failure, unspecified: Secondary | ICD-10-CM | POA: Diagnosis not present

## 2020-02-09 DIAGNOSIS — I10 Essential (primary) hypertension: Secondary | ICD-10-CM | POA: Diagnosis present

## 2020-02-09 DIAGNOSIS — R918 Other nonspecific abnormal finding of lung field: Secondary | ICD-10-CM | POA: Diagnosis not present

## 2020-02-09 DIAGNOSIS — G4700 Insomnia, unspecified: Secondary | ICD-10-CM | POA: Diagnosis not present

## 2020-02-09 DIAGNOSIS — M069 Rheumatoid arthritis, unspecified: Secondary | ICD-10-CM | POA: Diagnosis not present

## 2020-02-09 DIAGNOSIS — F17210 Nicotine dependence, cigarettes, uncomplicated: Secondary | ICD-10-CM | POA: Diagnosis present

## 2020-02-09 DIAGNOSIS — Z807 Family history of other malignant neoplasms of lymphoid, hematopoietic and related tissues: Secondary | ICD-10-CM

## 2020-02-09 DIAGNOSIS — D649 Anemia, unspecified: Secondary | ICD-10-CM

## 2020-02-09 DIAGNOSIS — Z833 Family history of diabetes mellitus: Secondary | ICD-10-CM

## 2020-02-09 DIAGNOSIS — Z79899 Other long term (current) drug therapy: Secondary | ICD-10-CM

## 2020-02-09 DIAGNOSIS — R0603 Acute respiratory distress: Secondary | ICD-10-CM | POA: Diagnosis not present

## 2020-02-09 DIAGNOSIS — D62 Acute posthemorrhagic anemia: Secondary | ICD-10-CM | POA: Diagnosis present

## 2020-02-09 DIAGNOSIS — Z7982 Long term (current) use of aspirin: Secondary | ICD-10-CM

## 2020-02-09 DIAGNOSIS — R0789 Other chest pain: Secondary | ICD-10-CM | POA: Diagnosis not present

## 2020-02-09 DIAGNOSIS — E785 Hyperlipidemia, unspecified: Secondary | ICD-10-CM | POA: Diagnosis present

## 2020-02-09 DIAGNOSIS — Z8249 Family history of ischemic heart disease and other diseases of the circulatory system: Secondary | ICD-10-CM

## 2020-02-09 DIAGNOSIS — R0602 Shortness of breath: Secondary | ICD-10-CM | POA: Diagnosis not present

## 2020-02-09 DIAGNOSIS — Z20822 Contact with and (suspected) exposure to covid-19: Secondary | ICD-10-CM | POA: Diagnosis present

## 2020-02-09 DIAGNOSIS — K222 Esophageal obstruction: Secondary | ICD-10-CM | POA: Diagnosis not present

## 2020-02-09 DIAGNOSIS — E538 Deficiency of other specified B group vitamins: Secondary | ICD-10-CM | POA: Diagnosis present

## 2020-02-09 DIAGNOSIS — I5023 Acute on chronic systolic (congestive) heart failure: Secondary | ICD-10-CM | POA: Diagnosis present

## 2020-02-09 DIAGNOSIS — R079 Chest pain, unspecified: Secondary | ICD-10-CM | POA: Diagnosis not present

## 2020-02-09 DIAGNOSIS — N189 Chronic kidney disease, unspecified: Secondary | ICD-10-CM | POA: Diagnosis present

## 2020-02-09 DIAGNOSIS — N1831 Chronic kidney disease, stage 3a: Secondary | ICD-10-CM | POA: Diagnosis present

## 2020-02-09 DIAGNOSIS — Z8719 Personal history of other diseases of the digestive system: Secondary | ICD-10-CM

## 2020-02-09 DIAGNOSIS — R195 Other fecal abnormalities: Secondary | ICD-10-CM | POA: Diagnosis not present

## 2020-02-09 LAB — CBC
HCT: 14.2 % — ABNORMAL LOW (ref 36.0–46.0)
HCT: 19.1 % — ABNORMAL LOW (ref 36.0–46.0)
Hemoglobin: 4.4 g/dL — CL (ref 12.0–15.0)
Hemoglobin: 6.1 g/dL — CL (ref 12.0–15.0)
MCH: 37.6 pg — ABNORMAL HIGH (ref 26.0–34.0)
MCH: 33 pg (ref 26.0–34.0)
MCHC: 31 g/dL (ref 30.0–36.0)
MCHC: 31.9 g/dL (ref 30.0–36.0)
MCV: 121.4 fL — ABNORMAL HIGH (ref 80.0–100.0)
MCV: 103.2 fL — ABNORMAL HIGH (ref 80.0–100.0)
Platelets: 174 10*3/uL (ref 150–400)
Platelets: 149 10*3/uL — ABNORMAL LOW (ref 150–400)
RBC: 1.17 MIL/uL — ABNORMAL LOW (ref 3.87–5.11)
RBC: 1.85 MIL/uL — ABNORMAL LOW (ref 3.87–5.11)
RDW: 20 % — ABNORMAL HIGH (ref 11.5–15.5)
RDW: 24.7 % — ABNORMAL HIGH (ref 11.5–15.5)
WBC: 6.7 10*3/uL (ref 4.0–10.5)
WBC: 5.5 10*3/uL (ref 4.0–10.5)
nRBC: 0.3 % — ABNORMAL HIGH (ref 0.0–0.2)
nRBC: 0 % (ref 0.0–0.2)

## 2020-02-09 LAB — FERRITIN: Ferritin: 33 ng/mL (ref 11–307)

## 2020-02-09 LAB — BASIC METABOLIC PANEL
Anion gap: 8 (ref 5–15)
BUN: 35 mg/dL — ABNORMAL HIGH (ref 8–23)
CO2: 20 mmol/L — ABNORMAL LOW (ref 22–32)
Calcium: 8.2 mg/dL — ABNORMAL LOW (ref 8.9–10.3)
Chloride: 101 mmol/L (ref 98–111)
Creatinine, Ser: 1.58 mg/dL — ABNORMAL HIGH (ref 0.44–1.00)
GFR calc Af Amer: 37 mL/min — ABNORMAL LOW (ref >60)
GFR calc non Af Amer: 32 mL/min — ABNORMAL LOW (ref >60)
Glucose, Bld: 139 mg/dL — ABNORMAL HIGH (ref 70–99)
Potassium: 4.8 mmol/L (ref 3.5–5.1)
Sodium: 129 mmol/L — ABNORMAL LOW (ref 135–145)

## 2020-02-09 LAB — FOLATE: Folate: 24.8 ng/mL (ref >5.9)

## 2020-02-09 LAB — RETICULOCYTES
Immature Retic Fract: 34.5 % — ABNORMAL HIGH (ref 2.3–15.9)
RBC.: 1.21 MIL/uL — ABNORMAL LOW (ref 3.87–5.11)
Retic Count, Absolute: 162.7 10*3/uL (ref 19.0–186.0)
Retic Ct Pct: 13.5 % — ABNORMAL HIGH (ref 0.4–3.1)

## 2020-02-09 LAB — VITAMIN B12: Vitamin B-12: 443 pg/mL (ref 180–914)

## 2020-02-09 LAB — IRON AND TIBC
Iron: 41 ug/dL (ref 28–170)
Saturation Ratios: 13 % (ref 10.4–31.8)
TIBC: 326 ug/dL (ref 250–450)
UIBC: 285 ug/dL

## 2020-02-09 LAB — TROPONIN I (HIGH SENSITIVITY)
Troponin I (High Sensitivity): 18 ng/L — ABNORMAL HIGH (ref <18)
Troponin I (High Sensitivity): 16 ng/L (ref <18)

## 2020-02-09 LAB — SARS CORONAVIRUS 2 BY RT PCR (HOSPITAL ORDER, PERFORMED IN ~~LOC~~ HOSPITAL LAB): SARS Coronavirus 2: NEGATIVE

## 2020-02-09 LAB — PREPARE RBC (CROSSMATCH)

## 2020-02-09 LAB — BRAIN NATRIURETIC PEPTIDE: B Natriuretic Peptide: 786.6 pg/mL — ABNORMAL HIGH (ref 0.0–100.0)

## 2020-02-09 LAB — POC OCCULT BLOOD, ED: Fecal Occult Bld: POSITIVE — AB

## 2020-02-09 MED ORDER — ALPRAZOLAM 0.25 MG PO TABS
0.5000 mg | ORAL_TABLET | Freq: Once | ORAL | Status: AC
  Administered 2020-02-09: 0.5 mg via ORAL
  Filled 2020-02-09: qty 2

## 2020-02-09 MED ORDER — SODIUM CHLORIDE 0.9 % IV SOLN: 10.0000 mL/h | Freq: Once | INTRAVENOUS | Status: DC

## 2020-02-09 MED ORDER — ACETAMINOPHEN 650 MG RE SUPP: 650.0000 mg | Freq: Four times a day (QID) | RECTAL | Status: DC | PRN

## 2020-02-09 MED ORDER — ONDANSETRON HCL 4 MG/2ML IJ SOLN: 4.0000 mg | Freq: Four times a day (QID) | INTRAMUSCULAR | Status: DC | PRN

## 2020-02-09 MED ORDER — ACETAMINOPHEN 325 MG PO TABS
650.0000 mg | ORAL_TABLET | Freq: Four times a day (QID) | ORAL | Status: DC | PRN
  Administered 2020-02-10 – 2020-02-11 (×2): 650 mg via ORAL
  Filled 2020-02-09 (×2): qty 2

## 2020-02-09 MED ORDER — ONDANSETRON HCL 4 MG PO TABS: 4.0000 mg | ORAL_TABLET | Freq: Four times a day (QID) | ORAL | Status: DC | PRN

## 2020-02-09 MED ORDER — SODIUM CHLORIDE 0.9 % IV SOLN
8.0000 mg/h | INTRAVENOUS | Status: DC
  Administered 2020-02-10 – 2020-02-11 (×3): 8 mg/h via INTRAVENOUS
  Filled 2020-02-09 (×5): qty 80

## 2020-02-09 MED ORDER — SODIUM CHLORIDE 0.9% FLUSH: 3.0000 mL | Freq: Once | INTRAVENOUS | Status: DC

## 2020-02-09 MED ORDER — LORAZEPAM 2 MG/ML IJ SOLN: 1.0000 mg | Freq: Once | INTRAMUSCULAR | Status: DC

## 2020-02-09 MED ORDER — SODIUM CHLORIDE 0.9 % IV SOLN
80.0000 mg | Freq: Once | INTRAVENOUS | Status: AC
  Administered 2020-02-09: 80 mg via INTRAVENOUS
  Filled 2020-02-09: qty 80

## 2020-02-09 MED ORDER — SODIUM CHLORIDE 0.9 % IV BOLUS
1000.0000 mL | Freq: Once | INTRAVENOUS | Status: AC
  Administered 2020-02-09: 1000 mL via INTRAVENOUS

## 2020-02-09 MED ORDER — ALPRAZOLAM 0.5 MG PO TABS
0.5000 mg | ORAL_TABLET | Freq: Once | ORAL | Status: AC
  Administered 2020-02-09: 0.5 mg via ORAL
  Filled 2020-02-09: qty 1

## 2020-02-09 NOTE — ED Provider Notes (Signed)
[facility] EMERGENCY DEPARTMENT Provider Note   CSN: [id number] Arrival date & time: 02/09/20  1310     History No chief complaint on file.   Regina Ortiz is a 74 y.o. female with a past medical history of COPD, GERD, CHF with an EF of 30 to 35%, A. fib/a flutter currently on Xarelto, recent endoscopy/colonoscopy done in December 2020 without active bleeding after found to have Hemoccult positive stools, presenting to the ED with her chief complaint of chest pain, shortness of breath and rectal bleeding.  States that 2 days ago she noticed dark-colored stools, has been feeling short of breath.  This morning started having constant chest pain.  She has not taken any medications to help with her symptoms.  She has been taking her home medications as regularly prescribed.  She denies any injuries or falls.  HPI     Past Medical History:  Diagnosis Date  . AIN grade I   . Anemia   . Anxiety   . Bruises easily   . Chronic diarrhea   . Chronic hyponatremia   . Colitis   . COPD (chronic obstructive pulmonary disease) ([place])   . Depression   . GERD (gastroesophageal reflux disease)   . History of chronic bronchitis   . History of vulvar dysplasia    serveral recurrency's   . Hypertension   . Insomnia   . LBBB (left bundle branch block) W/ PROLONGED PR   CARDIOLOGSIT-  DR Johnsie Cancel-   LOV IN EPIC  . Macrocytosis   . Major depressive disorder   . Nocturia   . Osteoporosis   . Raynaud's disease   . Sciatica    right side  . Seronegative rheumatoid arthritis ([place])   . Short of breath on exertion   . Smokers' cough ([place])   . Venous insufficiency of leg    , Edema  . VIN III (vulvar intraepithelial neoplasia III)    RECURRENT -  . Vitamin B12 deficiency     Patient Active Problem List   Diagnosis Date Noted  . Macrocytosis 02/09/2020  . Acute on chronic combined systolic and diastolic CHF (congestive heart failure) ([place]) 01/03/2020  . Acute pulmonary  edema (HCC)   . Acute respiratory failure with hypoxia ([place]) 12/31/2019  . COPD (chronic obstructive pulmonary disease) ([place]) 12/31/2019  . Elevated troponin 12/31/2019  . Osteopenia 11/01/2019  . Protein-calorie malnutrition, severe 08/19/2019  . Urinary frequency 04/15/2019  . Senile osteoporosis   . Hyperlipidemia   . Adrenal insufficiency ([place]) 04/13/2019  . Uncontrolled hypertension 04/13/2019  . Bilateral lower extremity edema 04/13/2019  . UTI (urinary tract infection) 04/02/2019  . Pain on right lower extremity 03/31/2019  . Acute blood loss anemia 03/21/2019  . Pressure injury of skin 03/15/2019  . Fracture of femoral neck, right, closed ([place]) 03/14/2019  . Fall at home, initial encounter 03/12/2019  . Urinary retention 02/15/2019  . Lumbosacral radiculopathy 02/15/2019  . Constipation 02/15/2019  . Acute urinary retention 02/15/2019  . Cauda equina syndrome ([place]) 01/28/2019  . Chest pain 09/25/2017  . Hypertensive urgency 09/24/2017  . Headache 09/24/2017  . Alcohol use 09/24/2017  . AKI (acute kidney injury) ([place]) 09/24/2017  . Bilateral carotid artery stenosis   . Vulvovaginal candidiasis 10/22/2015  . Mild malnutrition ([place]) 07/07/2013  . Chronic bronchitis ([place]) 07/07/2013  . Tobacco abuse 07/07/2013  . Hypertension   . Short of breath on exertion   . Hemorrhoid   . LBBB (left bundle branch block)   .  Anxiety   . Depression   . Rheumatoid arthritis ([place])   . Venous insufficiency of leg   . Chronic hyponatremia   . AIN grade I 04/05/2013  . Diarrhea 09/22/2012  . Hyponatremia 09/22/2012  . Hypokalemia 09/22/2012  . Acute on chronic kidney failure ([place]) 09/22/2012  . Atypical chest pain 06/22/2012  . Vulvar intraepithelial neoplasia III (VIN III) 01/29/2012    Past Surgical History:  Procedure Laterality Date  . APPENDECTOMY  1992  . BIOPSY  08/19/2019   Procedure: BIOPSY;  Surgeon: [name], MD;  Location: [facility];  Service:  Gastroenterology;;  . CARDIOVASCULAR STRESS TEST  06-23-2012  DR Jenkins Rouge   NORMAL LEXISCAN MYOVIEW/ EF 83%/  NO ISCHEMIA  . CO2 LASER APPLICATION  78/29/5621   Procedure: CO2 LASER APPLICATION;  Surgeon: Janie Morning, MD PHD;  Location: [facility] [place] [facility];  Service: Gynecology;  Laterality: N/A;  Laser Vaporization  . CO2 LASER APPLICATION N/A 3/0/8657   Procedure: CO2 LASER APPLICATION;  Surgeon: [name], MD;  Location: [facility];  Service: Gynecology;  Laterality: N/A;  . CO2 LASER APPLICATION N/A 8/46/9629   Procedure: CO2 LASER APPLICATION of the vulva;  Surgeon: Janie Morning, MD;  Location[facility];  Service: Gynecology;  Laterality: N/A;  . COLONOSCOPY WITH PROPOFOL N/A 08/19/2019   Procedure: COLONOSCOPY WITH PROPOFOL;  Surgeon: [name], MD;  Location: [facility];  Service: Gastroenterology;  Laterality: N/A;  . ESOPHAGOGASTRODUODENOSCOPY (EGD) WITH PROPOFOL N/A 08/19/2019   Procedure: ESOPHAGOGASTRODUODENOSCOPY (EGD) WITH PROPOFOL;  Surgeon: [name], MD;  Location: [facility];  Service: Gastroenterology;  Laterality: N/A;  . EXCISION VULVAR LESIONS  07/2012  . HEMORRHOID SURGERY N/A 04/15/2013   Procedure: Excision of external and internal  hemorrhoid with AIN, Exam under anesthesia;  Surgeon: Odis Hollingshead, MD;  Location: [facility];  Service: General;  Laterality: N/A;  . HIP PINNING,CANNULATED Right 03/15/2019   Procedure: Maebelle Munroe OF FEMORAL NECK FRACTURE;  Surgeon: Renette Butters, MD;  Location: [facility];  Service: Orthopedics;  Laterality: Right;  . POLYPECTOMY  08/19/2019   Procedure: POLYPECTOMY;  Surgeon: [name], MD;  Location: [facility];  Service: Gastroenterology;;  . REPAIR FISTULA IN ANO/  I & D PERIRECTAL ABSCESS  10-26-2002  DR Johnathan Hausen  . RIGHT/LEFT HEART CATH AND CORONARY ANGIOGRAPHY N/A 01/03/2020   Procedure: RIGHT/LEFT HEART CATH AND CORONARY ANGIOGRAPHY;  Surgeon: Belva Crome, MD;   Location: [facility];  Service: Cardiovascular;  Laterality: N/A;  . TRANSTHORACIC ECHOCARDIOGRAM  06-23-2012   NORMAL LVSF/ EF 55-60%  . VAGINAL HYSTERECTOMY  1997   partial  . VULVAR LESION REMOVAL N/A 11/03/2014   Procedure: CO2 LASER OF VULVAR ;  Surgeon: [name], MD;  Location: [facility];  Service: Gynecology;  Laterality: N/A;  . VULVECTOMY N/A 03/26/2015   Procedure: WIDE LOCA  EXCISION OF VULVAR;  Surgeon: [name], MD;  Location: [facility];  Service: Gynecology;  Laterality: N/A;  . Wide Local Excision of labia majora  04-04-2010   Right-sided lesion, CO2 ablation of right labia minora     OB History   No obstetric history on file.     Family History  Problem Relation Age of Onset  . Diabetes Mother   . Hypertension Father   . Heart attack Father 63  . Lymphoma Son   . Cancer Son        lymphoblastic lymphoma    Social History  Tobacco Use  . Smoking status: Current Every Day Smoker    Types: Cigarettes  . Smokeless tobacco: Never Used  . Tobacco comment: 2 cigarettes a day  Vaping Use  . Vaping Use: Never used  Substance Use Topics  . Alcohol use: Not Currently    Alcohol/week: 2.0 standard drinks    Types: 2 Cans of beer per week  . Drug use: No    Home Medications Prior to Admission medications   Medication Sig Start Date End Date Taking? Authorizing Provider  acetaminophen (TYLENOL) 325 MG tablet Take 650 mg by mouth every 6 (six) hours as needed for mild pain.     [provider]  ALPRAZolam Duanne Moron) 0.5 MG tablet Take 1 tablet (0.5 mg total) by mouth 3 (three) times daily as needed for anxiety. 01/06/20   Georgette Shell, MD  aspirin 81 MG chewable tablet Chew 1 tablet (81 mg total) by mouth daily. 01/07/20   Georgette Shell, MD  atorvastatin (LIPITOR) 80 MG tablet Take 1 tablet (80 mg total) by mouth daily. 01/07/20   Georgette Shell, MD  busPIRone (BUSPAR) 30 MG tablet Take 30 mg by mouth  2 (two) times daily.     [provider]  calcium carbonate (OSCAL) 1500 (600 Ca) MG TABS tablet Take 600 mg of elemental calcium by mouth daily.    [provider]  carvedilol (COREG) 12.5 MG tablet Take 1 tablet (12.5 mg total) by mouth 2 (two) times daily with a meal. 01/06/20   Georgette Shell, MD  denosumab (PROLIA) 60 MG/ML SOSY injection Inject 60 mg into the skin every 6 (six) months.    [provider]  ferrous sulfate 325 (65 FE) MG tablet Take 325 mg by mouth daily with breakfast.    [provider]  furosemide (LASIX) 40 MG tablet Take 1 tablet (40 mg total) by mouth daily. 01/07/20   Georgette Shell, MD  hydrALAZINE (APRESOLINE) 25 MG tablet Take 1 tablet (25 mg total) by mouth every 8 (eight) hours. 01/06/20   Georgette Shell, MD  hydroxychloroquine (PLAQUENIL) 200 MG tablet Take 200 mg by mouth daily. 11/27/19   [provider]  loperamide (IMODIUM) 2 MG capsule Take 1 capsule (2 mg total) by mouth every 6 (six) hours as needed for diarrhea or loose stools. 01/06/20   Georgette Shell, MD  mirtazapine (REMERON) 7.5 MG tablet Take 1 tablet (7.5 mg total) by mouth at bedtime. 01/16/20   Reed, Tiffany L, DO  Multiple Vitamin (MULTIVITAMIN WITH MINERALS) TABS tablet Take 1 tablet by mouth daily. 01/07/20   Georgette Shell, MD  nicotine (NICODERM CQ - DOSED IN MG/24 HR) 7 mg/24hr patch Place 1 patch (7 mg total) onto the skin daily. 01/07/20   Georgette Shell, MD  polyethylene glycol (MIRALAX / GLYCOLAX) 17 g packet Take 17 g by mouth daily as needed for mild constipation.     [provider]  predniSONE (DELTASONE) 5 MG tablet Take 5 mg by mouth daily.    [provider]  Rivaroxaban (XARELTO) 15 MG TABS tablet Take 1 tablet (15 mg total) by mouth daily with supper. 01/06/20   Georgette Shell, MD  saccharomyces boulardii (FLORASTOR) 250 MG capsule Take 1 capsule (250 mg total) by mouth 2 (two) times daily.  01/06/20   Georgette Shell, MD  spironolactone (ALDACTONE) 25 MG tablet Take 0.5 tablets (12.5 mg total) by mouth daily. 01/07/20   Georgette Shell, MD  Allergies    Latex, Augmentin [amoxicillin-pot clavulanate], Doxycycline, Hydrocodone, Lactose intolerance (gi), Lialda [mesalamine], Prinivil [lisinopril], Triamcinolone acetonide, Amlodipine, and Lotensin [benazepril hcl]  Review of Systems   Review of Systems  Constitutional: Negative for appetite change, chills and fever.  HENT: Negative for ear pain, rhinorrhea, sneezing and sore throat.   Eyes: Negative for photophobia and visual disturbance.  Respiratory: Positive for shortness of breath. Negative for cough, chest tightness and wheezing.   Cardiovascular: Positive for chest pain. Negative for palpitations.  Gastrointestinal: Positive for blood in stool. Negative for abdominal pain, constipation, diarrhea, nausea and vomiting.  Genitourinary: Negative for dysuria, hematuria and urgency.  Musculoskeletal: Negative for myalgias.  Skin: Negative for rash.  Neurological: Negative for dizziness, weakness and light-headedness.    Physical Exam Updated Vital Signs BP (!) 100/48   Pulse 69   Temp 97.7 F (36.5 C) (Oral)   Resp 18   LMP  (LMP Unknown)   SpO2 100%   Physical Exam Vitals and nursing note reviewed.  Constitutional:      General: She is not in acute distress.    Appearance: She is well-developed. She is ill-appearing.  HENT:     Head: Normocephalic and atraumatic.     Nose: Nose normal.  Eyes:     General: No scleral icterus.       Left eye: No discharge.     Conjunctiva/sclera: Conjunctivae normal.  Cardiovascular:     Rate and Rhythm: Normal rate and regular rhythm.     Heart sounds: Normal heart sounds. No murmur heard.  No friction rub. No gallop.   Pulmonary:     Effort: Pulmonary effort is normal. No respiratory distress.     Breath sounds: Normal breath sounds.  Abdominal:     General: Bowel  sounds are normal. There is no distension.     Palpations: Abdomen is soft.     Tenderness: There is no abdominal tenderness. There is no guarding.  Genitourinary:    Rectum: Guaiac result positive.     Comments: Black stool on rectal exam. Musculoskeletal:        General: Normal range of motion.     Cervical back: Normal range of motion and neck supple.  Skin:    General: Skin is warm and dry.     Coloration: Skin is pale.     Findings: No rash.  Neurological:     Mental Status: She is alert.     Motor: No abnormal muscle tone.     Coordination: Coordination normal.     ED Results / Procedures / Treatments   Labs (all labs ordered are listed, but only abnormal results are displayed) Labs Reviewed  BASIC METABOLIC PANEL - Abnormal; Notable for the following components:      Result Value   Sodium 129 (*)    CO2 20 (*)    Glucose, Bld 139 (*)    BUN 35 (*)    Creatinine, Ser 1.58 (*)    Calcium 8.2 (*)    GFR calc non Af Amer 32 (*)    GFR calc Af Amer 37 (*)    All other components within normal limits  CBC - Abnormal; Notable for the following components:   RBC 1.17 (*)    Hemoglobin 4.4 (*)    HCT 14.2 (*)    MCV 121.4 (*)    MCH 37.6 (*)    RDW 20.0 (*)    nRBC 0.3 (*)    All other components within  normal limits  BRAIN NATRIURETIC PEPTIDE - Abnormal; Notable for the following components:   B Natriuretic Peptide 786.6 (*)    All other components within normal limits  RETICULOCYTES - Abnormal; Notable for the following components:   Retic Ct Pct 13.5 (*)    RBC. 1.21 (*)    Immature Retic Fract 34.5 (*)    All other components within normal limits  POC OCCULT BLOOD, ED - Abnormal; Notable for the following components:   Fecal Occult Bld POSITIVE (*)    All other components within normal limits  TROPONIN I (HIGH SENSITIVITY) - Abnormal; Notable for the following components:   Troponin I (High Sensitivity) 18 (*)    All other components within normal limits    SARS CORONAVIRUS 2 BY RT PCR (HOSPITAL ORDER, [facility])  VITAMIN B12  IRON AND TIBC  FERRITIN  FOLATE  TYPE AND SCREEN  PREPARE RBC (CROSSMATCH)  TROPONIN I (HIGH SENSITIVITY)    EKG None  Radiology DG Chest 2 View  Result Date: 02/09/2020 CLINICAL DATA:  Chest pain and shortness of breath for 1 day. EXAM: CHEST - 2 VIEW COMPARISON:  Single-view of the chest 12/31/2019. PA and lateral chest 09/13/2019. FINDINGS: Lung volumes are low but the lungs are clear. Heart size is normal. Atherosclerosis noted. No pneumothorax or pleural effusion. IMPRESSION: No acute disease. Aortic Atherosclerosis (ICD10-I70.0). Electronically Signed   By: Inge Rise M.D.   On: 02/09/2020 14:14    Procedures .Critical Care Performed by: Delia Heady, PA-C Authorized by: Delia Heady, PA-C   Critical care provider statement:    Critical care time (minutes):  35   Critical care time was exclusive of:  Separately billable procedures and treating other patients   Critical care was necessary to treat or prevent imminent or life-threatening deterioration of the following conditions:  Cardiac failure, circulatory failure, respiratory failure and CNS failure or compromise   Critical care was time spent personally by me on the following activities:  Development of treatment plan with patient or surrogate, discussions with consultants, evaluation of patient's response to treatment, examination of patient, obtaining history from patient or surrogate, ordering and review of laboratory studies, ordering and review of radiographic studies, pulse oximetry and re-evaluation of patient's condition   I assumed direction of critical care for this patient from another provider in my specialty: no     (including critical care time)  Medications Ordered in ED Medications  sodium chloride flush (NS) 0.9 % injection 3 mL (has no administration in time range)  sodium chloride 0.9 % bolus 1,000  mL (has no administration in time range)  0.9 %  sodium chloride infusion (has no administration in time range)    ED Course  I have reviewed the triage vital signs and the nursing notes.  Pertinent labs & imaging results that were available during my care of the patient were reviewed by me and considered in my medical decision making (see chart for details).  Clinical Course as of Feb 09 1539  Thu Feb 09, 2020  1503 [place] for 1 unit of emergent release blood.   [HK]  1503 Hemoglobin(!!): 4.4 [HK]  1524 Spoke to Dr. Michail Sermon of [facility].  They will see the patient in consult, he did review patient's chart.   [HK]  1532 BP(!): 100/48 [HK]    Clinical Course User Index [HK] Delia Heady, PA-C   MDM Rules/Calculators/A&P  74 year old female presenting to the ED with chest pain, shortness of breath and GI bleeding.  Symptoms have been going on for the past 2 days.  She is currently anticoagulated on Xarelto for her A. fib/a flutter.  She had a bowel movement in the ED that appeared to be dark red/black.  Blood pressures 03-500 systolic.  Lab work significant for hemoglobin of 4.4.  Patient was given 1 unit of emergency release blood and ordered 3 units.  She was given IV fluids as well.  There was some improvement in systolic blood pressure with this.  She is endorsing chest pain and shortness of breath which I feel is due to her symptomatic anemia.  Chart review showed that patient had similar Hemoccult positive stools in December 2020 with unremarkable endoscopy/colonoscopy findings.  She last took Xarelto yesterday.  [facility] to follow in consult for this admission.  Remainder of lab work significant for sodium of 129 which appears chronic, BNP in the 700s, troponin of 18 which I feel could be due to demand.  EKG shows left bundle branch block which patient has a history of.  Will admit the patient to hospitalist service and have GI consult.  All imaging,  if done today, including plain films, CT scans, and ultrasounds, independently reviewed by me, and interpretations confirmed via formal radiology reads.  Portions of this note were generated with [employer]. Dictation errors may occur despite best attempts at proofreading.  Final Clinical Impression(s) / ED Diagnoses Final diagnoses:  Symptomatic anemia  Gastrointestinal hemorrhage, unspecified gastrointestinal hemorrhage type    Rx / DC Orders ED Discharge Orders    None       Delia Heady, PA-C 02/09/20 1542    Milton Ferguson, MD 02/13/20 [phone]

## 2020-02-09 NOTE — Plan of Care (Signed)

## 2020-02-09 NOTE — ED Notes (Signed)
Help take patient to the bathroom wheeled patient got her in the bed on the monitor into a gown patient is resting with call bell in reach

## 2020-02-09 NOTE — ED Notes (Signed)
Date and time results received: 02/09/20 1445 (use smartphrase ".now" to insert current time)  Test: hgb Critical Value: 4.4  Name of Provider Notified: Delia Heady, PA  Orders Received? Or Actions Taken?: Orders Received - See Orders for details and Actions Taken: EDP to review chart further prior to new orders

## 2020-02-09 NOTE — Consult Note (Signed)
Referring Provider: Delia Heady, PA-C Primary Care Physician:  Gayland Curry, DO Primary Gastroenterologist:  Dr. Therisa Doyne [facility])  Reason for Consultation:  Upper GI bleeding  HPI: Regina Ortiz is a 74 y.o. female with past medical history noted below to include for A. fib (on Xarelto and aspirin), COPD, CHF (EF 30-35% as of 12/2019), and anxiety presenting suspected upper GI bleeding.  Patient noted loose, melenic stools on Tuesday 6/8.  She then took Imodium and did not have any bowel movements on Wednesday 6/9.  However, today, she has had at least 5 melenic, loose stools.  She also started experiencing weakness, shortness of breath, and chest pain, leading her to present to the ED.  Patient states she has intermittent diarrhea for which she takes Imodium.  However, she did not noticed any black stools until Tuesday 6/8.  Denies any abdominal pain, nausea, vomiting, dysphagia.    States she was on Xarelto in the past and recently restarted it within the last couple of months.  She is also on 81 mg aspirin daily.  Denies NSAID use.  Is unsure if she has been taking a PPI at home, stating that she has a lot of medications and cannot keep track of all of them.  Last EGD was 10/24/2019 and was pertinent for 4 cm hiatal hernia, scarred (post-ulcer) mucosa in the antrum, and one duodenal polyp (bx: benign, negative for, celiac and Giardia).  She also had an EGD 08/19/2019 which showed erosive gastropathy with stigmata of recent bleeding, erythematous mucosa in the antrum (bx: negative for H. Pylori).  Normal duodenum, biopsied (bx: consistent with tubular adenoma).   Last colonoscopy was 08/19/2019 and showed two 4 to 9 mm polyps in the transverse colon (dx: tubular adenoma, no dysplasia or carcinoma), two 4 to 9 mm polyps in the descending colon (dx: tubular adenoma, no dysplasia or carcinoma), sigmoid and descending diverticulosis in the sigmoid colon and in the descending colon,  erythematous, inflamed and nodular mucosa in the cecum.   Past Medical History:  Diagnosis Date   AIN grade I    Anemia    Anxiety    Bruises easily    Chronic diarrhea    Chronic hyponatremia    Colitis    COPD (chronic obstructive pulmonary disease) (HCC)    Depression    GERD (gastroesophageal reflux disease)    History of chronic bronchitis    History of vulvar dysplasia    serveral recurrency's    Hypertension    Insomnia    LBBB (left bundle branch block) W/ PROLONGED PR   CARDIOLOGSIT-  DR Johnsie Cancel-   LOV IN EPIC   Macrocytosis    Major depressive disorder    Nocturia    Osteoporosis    Raynaud's disease    Sciatica    right side   Seronegative rheumatoid arthritis (HCC)    Short of breath on exertion    Smokers' cough ([place])    Venous insufficiency of leg    , Edema   VIN III (vulvar intraepithelial neoplasia III)    RECURRENT -   Vitamin B12 deficiency     Past Surgical History:  Procedure Laterality Date   APPENDECTOMY  1992   BIOPSY  08/19/2019   Procedure: BIOPSY;  Surgeon: [name], MD;  Location: [place];  Service: Gastroenterology;;   CARDIOVASCULAR STRESS TEST  06-23-2012  DR Jenkins [place]   NORMAL LEXISCAN MYOVIEW/ EF 83%/  NO ISCHEMIA   CO2 LASER APPLICATION  51/88/4166  Procedure: CO2 LASER APPLICATION;  Surgeon: Janie Morning, MD PHD;  Location: [facility];  Service: Gynecology;  Laterality: N/A;  Laser Vaporization   CO2 LASER APPLICATION N/A 03/10/239   Procedure: CO2 LASER APPLICATION;  Surgeon: [name], MD;  Location: [facility];  Service: Gynecology;  Laterality: N/A;   CO2 LASER APPLICATION N/A 9/73/5329   Procedure: CO2 LASER APPLICATION of the vulva;  Surgeon: Janie Morning, MD;  Location: [facility];  Service: Gynecology;  Laterality: N/A;   COLONOSCOPY WITH PROPOFOL N/A 08/19/2019   Procedure: COLONOSCOPY WITH PROPOFOL;  Surgeon: [name], MD;   Location: [place];  Service: Gastroenterology;  Laterality: N/A;   ESOPHAGOGASTRODUODENOSCOPY (EGD) WITH PROPOFOL N/A 08/19/2019   Procedure: ESOPHAGOGASTRODUODENOSCOPY (EGD) WITH PROPOFOL;  Surgeon: [name], MD;  Location: [place];  Service: Gastroenterology;  Laterality: N/A;   EXCISION VULVAR LESIONS  07/2012   HEMORRHOID SURGERY N/A 04/15/2013   Procedure: Excision of external and internal  hemorrhoid with AIN, Exam under anesthesia;  Surgeon: Odis Hollingshead, MD;  Location: [facility];  Service: General;  Laterality: N/A;   HIP PINNING,CANNULATED Right 03/15/2019   Procedure: Maebelle Munroe OF FEMORAL NECK FRACTURE;  Surgeon: Renette Butters, MD;  Location: [place];  Service: Orthopedics;  Laterality: Right;   POLYPECTOMY  08/19/2019   Procedure: POLYPECTOMY;  Surgeon: [name], MD;  Location: [place];  Service: Gastroenterology;;   REPAIR FISTULA IN ANO/  I & D PERIRECTAL ABSCESS  10-26-2002  DR MATTHEW MARTIN   RIGHT/LEFT HEART CATH AND CORONARY ANGIOGRAPHY N/A 01/03/2020   Procedure: RIGHT/LEFT HEART CATH AND CORONARY ANGIOGRAPHY;  Surgeon: Belva Crome, MD;  Location: [place] [facility];  Service: Cardiovascular;  Laterality: N/A;   TRANSTHORACIC ECHOCARDIOGRAM  06-23-2012   NORMAL LVSF/ EF 55-60%   VAGINAL HYSTERECTOMY  1997   partial   VULVAR LESION REMOVAL N/A 11/03/2014   Procedure: CO2 LASER OF VULVAR ;  Surgeon: [name], MD;  Location: [place];  Service: Gynecology;  Laterality: N/A;   VULVECTOMY N/A 03/26/2015   Procedure: WIDE LOCA  EXCISION OF VULVAR;  Surgeon: [name], MD;  Location: [place];  Service: Gynecology;  Laterality: N/A;   Wide Local Excision of labia majora  04-04-2010   Right-sided lesion, CO2 ablation of right labia minora    Prior to Admission medications   Medication Sig Start Date End Date Taking? Authorizing Provider  acetaminophen (TYLENOL) 325 MG tablet Take 650 mg by mouth every 6 (six)  hours as needed for mild pain.    Yes [provider]  ALPRAZolam (XANAX) 0.5 MG tablet Take 1 tablet (0.5 mg total) by mouth 3 (three) times daily as needed for anxiety. 01/06/20  Yes Georgette Shell, MD  aspirin 81 MG chewable tablet Chew 1 tablet (81 mg total) by mouth daily. 01/07/20  Yes Georgette Shell, MD  atorvastatin (LIPITOR) 80 MG tablet Take 1 tablet (80 mg total) by mouth daily. 01/07/20  Yes Georgette Shell, MD  busPIRone (BUSPAR) 30 MG tablet Take 30 mg by mouth 2 (two) times daily.    Yes [provider]  calcium carbonate (OSCAL) 1500 (600 Ca) MG TABS tablet Take 600 mg of elemental calcium by mouth daily.   Yes [provider]  carvedilol (COREG) 12.5 MG tablet Take 1 tablet (12.5 mg total) by mouth 2 (two) times daily with a meal. 01/06/20  Yes Georgette Shell, MD  denosumab (PROLIA) 60 MG/ML  SOSY injection Inject 60 mg into the skin every 6 (six) months.   Yes [provider]  ferrous sulfate 325 (65 FE) MG tablet Take 325 mg by mouth daily with breakfast.   Yes [provider]  furosemide (LASIX) 40 MG tablet Take 1 tablet (40 mg total) by mouth daily. 01/07/20  Yes Georgette Shell, MD  hydrALAZINE (APRESOLINE) 25 MG tablet Take 1 tablet (25 mg total) by mouth every 8 (eight) hours. 01/06/20  Yes Georgette Shell, MD  hydroxychloroquine (PLAQUENIL) 200 MG tablet Take 200 mg by mouth daily. 11/27/19  Yes [provider]  loperamide (IMODIUM) 2 MG capsule Take 1 capsule (2 mg total) by mouth every 6 (six) hours as needed for diarrhea or loose stools. 01/06/20  Yes Georgette Shell, MD  mirtazapine (REMERON) 7.5 MG tablet Take 1 tablet (7.5 mg total) by mouth at bedtime. 01/16/20  Yes Reed, Tiffany L, DO  Multiple Vitamin (MULTIVITAMIN WITH MINERALS) TABS tablet Take 1 tablet by mouth daily. 01/07/20  Yes Georgette Shell, MD  Polyethyl Glycol-Propyl Glycol (SYSTANE FREE OP) Place 1 drop into both eyes daily as  needed (For Dry eyes).   Yes [provider]  polyethylene glycol (MIRALAX / GLYCOLAX) 17 g packet Take 17 g by mouth daily as needed for mild constipation.    Yes [provider]  predniSONE (DELTASONE) 5 MG tablet Take 5 mg by mouth daily.   Yes [provider]  Rivaroxaban (XARELTO) 15 MG TABS tablet Take 1 tablet (15 mg total) by mouth daily with supper. 01/06/20  Yes Georgette Shell, MD  saccharomyces boulardii (FLORASTOR) 250 MG capsule Take 1 capsule (250 mg total) by mouth 2 (two) times daily. 01/06/20  Yes Georgette Shell, MD  spironolactone (ALDACTONE) 25 MG tablet Take 0.5 tablets (12.5 mg total) by mouth daily. 01/07/20  Yes Georgette Shell, MD  nicotine (NICODERM CQ - DOSED IN MG/24 HR) 7 mg/24hr patch Place 1 patch (7 mg total) onto the skin daily. 01/07/20   Georgette Shell, MD    Scheduled Meds:  ALPRAZolam  0.5 mg Oral Once   sodium chloride flush  3 mL Intravenous Once   Continuous Infusions:  sodium chloride     PRN Meds:.acetaminophen **OR** acetaminophen, ondansetron **OR** ondansetron (ZOFRAN) IV  Allergies as of 02/09/2020 - Review Complete 02/09/2020  Allergen Reaction Noted   Latex Swelling and Other (See Comments) 03/30/2012   Augmentin [amoxicillin-pot clavulanate] Nausea And Vomiting 01/28/2019   Doxycycline Other (See Comments) 09/22/2013   Hydrocodone Itching 11/01/2019   Lactose intolerance (gi) Other (See Comments) 07/15/2012   Lialda [mesalamine] Diarrhea 10/22/2015   Prinivil [lisinopril] Diarrhea 07/15/2012   Triamcinolone acetonide Other (See Comments) 07/15/2012   Amlodipine Rash 07/15/2012   Lotensin [benazepril hcl] Rash 07/15/2012    Family History  Problem Relation Age of Onset   Diabetes Mother    Hypertension Father    Heart attack Father 45   Lymphoma Son    Cancer Son        lymphoblastic lymphoma    Social History   Socioeconomic History   Marital status: Widowed     Spouse name: Not on file   Number of children: Not on file   Years of education: Not on file   Highest education level: Not on file  Occupational History   Not on file  Tobacco Use   Smoking status: Current Every Day Smoker    Types: Cigarettes   Smokeless tobacco:  Never Used   Tobacco comment: 2 cigarettes a day  Vaping Use   Vaping Use: Never used  Substance and Sexual Activity   Alcohol use: Not Currently    Alcohol/week: 2.0 standard drinks    Types: 2 Cans of beer per week   Drug use: No   Sexual activity: Yes  Other Topics Concern   Not on file  Social History Narrative   Social History      Diet? reg      Do you drink/eat things with caffeine? yes      Marital status?                  Widowed                   What year were you married? 1993      Do you live in a house, apartment, assisted living, condo, trailer, etc.? House condo      Is it one or more stories? 2 stories       How many persons live in your home? 1      Do you have any pets in your home? (please list) 0      Highest level of education completed?1 year college       Current or past profession: telemarketer      Do you exercise?                           yes           Type & how often?  Walk 2 - 3  Outside/inside  everyday       Advanced Directives      Do you have a living will? yes      Do you have a DNR form?      yes                            If not, do you want to discuss one?       Do you have signed POA/HPOA for forms? No       Functional Status      Do you have difficulty bathing or dressing yourself? yes      Do you have difficulty preparing food or eating? yes      Do you have difficulty managing your medications? yes      Do you have difficulty managing your finances? yes      Do you have difficulty affording your medications? No       Social Determinants of Radio broadcast assistant Strain:    Difficulty of Paying Living Expenses:   Food  Insecurity:    Worried About Charity fundraiser in the Last Year:    Arboriculturist in the Last Year:   Transportation Needs:    Film/video editor (Medical):    Lack of Transportation (Non-Medical):   Physical Activity:    Days of Exercise per Week:    Minutes of Exercise per Session:   Stress:    Feeling of Stress :   Social Connections:    Frequency of Communication with Friends and Family:    Frequency of Social Gatherings with Friends and Family:    Attends Religious Services:    Active Member of Clubs or Organizations:    Attends Archivist Meetings:    Marital Status:  Intimate Partner Violence:    Fear of Current or Ex-Partner:    Emotionally Abused:    Physically Abused:    Sexually Abused:     Review of Systems: Review of Systems  Constitutional: Positive for malaise/fatigue. Negative for chills, fever and weight loss.  HENT: Negative for hearing loss and tinnitus.   Eyes: Negative for pain and redness.  Respiratory: Positive for shortness of breath. Negative for cough.   Cardiovascular: Positive for chest pain. Negative for palpitations.  Gastrointestinal: Positive for abdominal pain, diarrhea and melena. Negative for blood in stool, constipation, heartburn, nausea and vomiting.  Genitourinary: Negative for flank pain and hematuria.  Musculoskeletal: Negative for back pain and joint pain.  Skin: Negative for itching and rash.  Neurological: Negative for seizures and loss of consciousness.  Endo/Heme/Allergies: Negative for polydipsia. Bruises/bleeds easily.  Psychiatric/Behavioral: Negative for substance abuse. The patient is nervous/anxious.     Physical Exam: Vital signs: Vitals:   02/09/20 1600 02/09/20 1615  BP: (!) 106/42 (!) 149/43  Pulse: 69 70  Resp: 17 20  Temp:    SpO2: 100% 100%      Physical Exam Constitutional:      General: She is not in acute distress.    Comments: thin  HENT:     Head: Normocephalic  and atraumatic.  Eyes:     General: No scleral icterus.    Extraocular Movements: Extraocular movements intact.     Comments: Conjunctival pallor  Cardiovascular:     Rate and Rhythm: Normal rate and regular rhythm.     Pulses: Normal pulses.     Heart sounds: Normal heart sounds.  Pulmonary:     Effort: Pulmonary effort is normal. No respiratory distress.     Breath sounds: Normal breath sounds.  Abdominal:     General: Bowel sounds are normal. There is no distension (epigastric).     Palpations: Abdomen is soft. There is no mass.     Tenderness: There is abdominal tenderness. There is no guarding or rebound.  Musculoskeletal:        General: No swelling or tenderness.     Cervical back: Normal range of motion and neck supple.     Right lower leg: No edema.     Left lower leg: No edema.  Skin:    General: Skin is warm and dry.     Coloration: Skin is pale.  Neurological:     General: No focal deficit present.     Mental Status: She is oriented to person, place, and time. She is lethargic.  Psychiatric:        Mood and Affect: Mood is anxious.        Behavior: Behavior normal. Behavior is cooperative.     GI:  Lab Results: Recent Labs    02/09/20 1317  WBC 6.7  HGB 4.4*  HCT 14.2*  PLT 174   BMET Recent Labs    02/09/20 1317  NA 129*  K 4.8  CL 101  CO2 20*  GLUCOSE 139*  BUN 35*  CREATININE 1.58*  CALCIUM 8.2*   LFT No results for input(s): PROT, ALBUMIN, AST, ALT, ALKPHOS, BILITOT, BILIDIR, IBILI in the last 72 hours. PT/INR No results for input(s): LABPROT, INR in the last 72 hours.   Studies/Results: DG Chest 2 View  Result Date: 02/09/2020 CLINICAL DATA:  Chest pain and shortness of breath for 1 day. EXAM: CHEST - 2 VIEW COMPARISON:  Single-view of the chest 12/31/2019. PA and lateral chest 09/13/2019.  FINDINGS: Lung volumes are low but the lungs are clear. Heart size is normal. Atherosclerosis noted. No pneumothorax or pleural effusion.  IMPRESSION: No acute disease. Aortic Atherosclerosis (ICD10-I70.0). Electronically Signed   By: Inge Rise M.D.   On: 02/09/2020 14:14    Impression: Suspected upper GI bleeding in the setting of Xarelto and aspirin -Hgb 4.4 on arrival, as compared to 11.2 on 01/04/20 -BUN 35/Cr 1.58 -Currently hemodynamically stable (normal HR, normotensive)  A. Fib on Xarelto: Last dose 6/9  Chronic hyponatremia: 129 today  CHF (EF 30-35%)  Plan: EGD tomorrow with Dr. Michail Sermon.  I thoroughly discussed the procedure, nature, benefits, risks (including but not limited to bleeding, perforation, infection, anesthesia/cardiac or pulmonary complications).  Patient verbalized understanding and gave consent to proceed with the procedure tomorrow.    Protonix drip ordered.   Continue to monitor H&H with transfusion as needed to maintain Hgb 7-8.  [facility] will follow.   LOS: 0 days   Salley Slaughter  PA-C 02/09/2020, 4:32 PM  Contact #  [phone]

## 2020-02-09 NOTE — ED Triage Notes (Signed)
Patient arrived by [facility] for increased SOB and CP for the past 2 days, also reports 5 episodes of diarrhea this am. Patient rates the pain worse with inspiration. Alert and oriernted

## 2020-02-09 NOTE — ED Notes (Signed)
2W unable to take report at this time. Phone # provided.

## 2020-02-09 NOTE — H&P (Signed)
History and Physical    Regina Ortiz:[id number] DOB: 1946-03-12 DOA: 02/09/2020  PCP: Regina Curry, DO  Patient coming from: Home I have personally briefly reviewed patient's old medical records in [place]  Chief Complaint: Chest pain, shortness of breath, Black tarry stool  HPI: Regina Ortiz is a 74 y.o. female with medical history significant of A. fib on Xarelto, COPD, anxiety, CHF with ejection fraction of 30 to 35%, GERD, depression presented with midsternal chest pain, shortness of breath and black tarry stool since 2 days.  Patient tells me that she had midsternal chest pain, radiates to her left arm, associated with shortness of breath.  Noticed black tarry watery diarrhea this morning.  She was too weak to get up from commode.  She called her caregiver because she was very dizzy and lightheaded and came to ER for further evaluation and management.    She tells me that she noted black tarry stool on Tuesday and then she took Imodium and her diarrhea resolved however this morning she had multiple loose stools which was black and she noticed bright red blood in it.  She is on Xarelto for the history of A. fib.  Her last dose was this morning.  Denies over-the-counter NSAID use, decreased appetite, weight loss, abdominal pain, headache, nausea, vomiting, urinary symptoms.  She lives alone.  Has a caregiver who takes care of her during morning time.  She has quit smoking.  No history of alcohol, recent drug use.  She does have leg swelling however denies orthopnea, PND, palpitation, fever, chills, cough or congestion.  During my encounter: Patient appears anxious and requested for Xanax.  ED Course: Upon arrival to ED: Patient's heart rate, respiratory rate: WNL, blood pressure soft.  Afebrile with no leukocytosis.  CBC shows hemoglobin of 4.4, MCV: 121, POC occult blood positive, troponin: 18, chest x-ray negative.  CMP shows sodium of 129, AKI on CKD.  Patient  received 1 unit PRBC in the ED.  EDP consulted GI for further evaluation and management.  Tried hospitalist consulted for admission for symptomatic anemia.  Review of Systems: As per HPI otherwise negative.    Past Medical History:  Diagnosis Date  . AIN grade I   . Anemia   . Anxiety   . Bruises easily   . Chronic diarrhea   . Chronic hyponatremia   . Colitis   . COPD (chronic obstructive pulmonary disease) ([place])   . Depression   . GERD (gastroesophageal reflux disease)   . History of chronic bronchitis   . History of vulvar dysplasia    serveral recurrency's   . Hypertension   . Insomnia   . LBBB (left bundle branch block) W/ PROLONGED PR   CARDIOLOGSIT-  DR Johnsie Cancel-   LOV IN EPIC  . Macrocytosis   . Major depressive disorder   . Nocturia   . Osteoporosis   . Raynaud's disease   . Sciatica    right side  . Seronegative rheumatoid arthritis ([place])   . Short of breath on exertion   . Smokers' cough ([place])   . Venous insufficiency of leg    , Edema  . VIN III (vulvar intraepithelial neoplasia III)    RECURRENT -  . Vitamin B12 deficiency     Past Surgical History:  Procedure Laterality Date  . APPENDECTOMY  1992  . BIOPSY  08/19/2019   Procedure: BIOPSY;  Surgeon: [name], MD;  Location: [place];  Service: Gastroenterology;;  .  CARDIOVASCULAR STRESS TEST  06-23-2012  DR Jenkins [place]   NORMAL LEXISCAN MYOVIEW/ EF 83%/  NO ISCHEMIA  . CO2 LASER APPLICATION  74/08/8785   Procedure: CO2 LASER APPLICATION;  Surgeon: Janie Morning, MD PHD;  Location: [facility];  Service: Gynecology;  Laterality: N/A;  Laser Vaporization  . CO2 LASER APPLICATION N/A 03/06/7208   Procedure: CO2 LASER APPLICATION;  Surgeon: [name], MD;  Location: [facility];  Service: Gynecology;  Laterality: N/A;  . CO2 LASER APPLICATION N/A 4/70/9628   Procedure: CO2 LASER APPLICATION of the vulva;  Surgeon: Janie Morning, MD;  Location[facility];  Service: Gynecology;  Laterality: N/A;  . COLONOSCOPY WITH PROPOFOL N/A 08/19/2019   Procedure: COLONOSCOPY WITH PROPOFOL;  Surgeon: [name], MD;  Location: [place];  Service: Gastroenterology;  Laterality: N/A;  . ESOPHAGOGASTRODUODENOSCOPY (EGD) WITH PROPOFOL N/A 08/19/2019   Procedure: ESOPHAGOGASTRODUODENOSCOPY (EGD) WITH PROPOFOL;  Surgeon: [name], MD;  Location: [place];  Service: Gastroenterology;  Laterality: N/A;  . EXCISION VULVAR LESIONS  07/2012  . HEMORRHOID SURGERY N/A 04/15/2013   Procedure: Excision of external and internal  hemorrhoid with AIN, Exam under anesthesia;  Surgeon: Odis Hollingshead, MD;  Location: [facility];  Service: General;  Laterality: N/A;  . HIP PINNING,CANNULATED Right 03/15/2019   Procedure: Maebelle Munroe OF FEMORAL NECK FRACTURE;  Surgeon: Renette Butters, MD;  Location: [place];  Service: Orthopedics;  Laterality: Right;  . POLYPECTOMY  08/19/2019   Procedure: POLYPECTOMY;  Surgeon: [name], MD;  Location: [facility];  Service: Gastroenterology;;  . REPAIR FISTULA IN ANO/  I & D PERIRECTAL ABSCESS  10-26-2002  DR Johnathan Hausen  . RIGHT/LEFT HEART CATH AND CORONARY ANGIOGRAPHY N/A 01/03/2020   Procedure: RIGHT/LEFT HEART CATH AND CORONARY ANGIOGRAPHY;  Surgeon: Belva Crome, MD;  Location: [place] [facility];  Service: Cardiovascular;  Laterality: N/A;  . TRANSTHORACIC ECHOCARDIOGRAM  06-23-2012   NORMAL LVSF/ EF 55-60%  . VAGINAL HYSTERECTOMY  1997   partial  . VULVAR LESION REMOVAL N/A 11/03/2014   Procedure: CO2 LASER OF VULVAR ;  Surgeon: [name], MD;  Location: [facility];  Service: Gynecology;  Laterality: N/A;  . VULVECTOMY N/A 03/26/2015   Procedure: WIDE LOCA  EXCISION OF VULVAR;  Surgeon: [name], MD;  Location: [place];  Service: Gynecology;  Laterality: N/A;  . Wide Local Excision of labia majora  04-04-2010   Right-sided lesion, CO2 ablation of right labia minora     reports  that she has been smoking cigarettes. She has never used smokeless tobacco. She reports previous alcohol use of about 2.0 standard drinks of alcohol per week. She reports that she does not use drugs.  Allergies  Allergen Reactions  . Latex Swelling and Other (See Comments)     fever blisters  . Augmentin [Amoxicillin-Pot Clavulanate] Nausea And Vomiting  . Doxycycline Other (See Comments)    Blurred vision, nervous, unsteady  . Hydrocodone Itching    Acetaminophen:itching, confusion, hallucinations-side effects-Onset date 07/15/2018  . Lactose Intolerance (Gi) Other (See Comments)    Unknown, per pt  . Lialda [Mesalamine] Diarrhea  . Prinivil [Lisinopril] Diarrhea  . Triamcinolone Acetonide Other (See Comments)    REDNESS AND PAIN  . Amlodipine Rash  . Lotensin [Benazepril Hcl] Rash    Family History  Problem Relation Age of Onset  . Diabetes Mother   . Hypertension Father   . Heart attack Father 41  . Lymphoma Son   .  Cancer Son        lymphoblastic lymphoma    Prior to Admission medications   Medication Sig Start Date End Date Taking? Authorizing Provider  acetaminophen (TYLENOL) 325 MG tablet Take 650 mg by mouth every 6 (six) hours as needed for mild pain.     [provider]  ALPRAZolam Duanne Moron) 0.5 MG tablet Take 1 tablet (0.5 mg total) by mouth 3 (three) times daily as needed for anxiety. 01/06/20   Georgette Shell, MD  aspirin 81 MG chewable tablet Chew 1 tablet (81 mg total) by mouth daily. 01/07/20   Georgette Shell, MD  atorvastatin (LIPITOR) 80 MG tablet Take 1 tablet (80 mg total) by mouth daily. 01/07/20   Georgette Shell, MD  busPIRone (BUSPAR) 30 MG tablet Take 30 mg by mouth 2 (two) times daily.     [provider]  calcium carbonate (OSCAL) 1500 (600 Ca) MG TABS tablet Take 600 mg of elemental calcium by mouth daily.    [provider]  carvedilol (COREG) 12.5 MG tablet Take 1 tablet (12.5 mg total) by mouth 2 (two) times  daily with a meal. 01/06/20   Georgette Shell, MD  denosumab (PROLIA) 60 MG/ML SOSY injection Inject 60 mg into the skin every 6 (six) months.    [provider]  ferrous sulfate 325 (65 FE) MG tablet Take 325 mg by mouth daily with breakfast.    [provider]  furosemide (LASIX) 40 MG tablet Take 1 tablet (40 mg total) by mouth daily. 01/07/20   Georgette Shell, MD  hydrALAZINE (APRESOLINE) 25 MG tablet Take 1 tablet (25 mg total) by mouth every 8 (eight) hours. 01/06/20   Georgette Shell, MD  hydroxychloroquine (PLAQUENIL) 200 MG tablet Take 200 mg by mouth daily. 11/27/19   [provider]  loperamide (IMODIUM) 2 MG capsule Take 1 capsule (2 mg total) by mouth every 6 (six) hours as needed for diarrhea or loose stools. 01/06/20   Georgette Shell, MD  mirtazapine (REMERON) 7.5 MG tablet Take 1 tablet (7.5 mg total) by mouth at bedtime. 01/16/20   Reed, Tiffany L, DO  Multiple Vitamin (MULTIVITAMIN WITH MINERALS) TABS tablet Take 1 tablet by mouth daily. 01/07/20   Georgette Shell, MD  nicotine (NICODERM CQ - DOSED IN MG/24 HR) 7 mg/24hr patch Place 1 patch (7 mg total) onto the skin daily. 01/07/20   Georgette Shell, MD  polyethylene glycol (MIRALAX / GLYCOLAX) 17 g packet Take 17 g by mouth daily as needed for mild constipation.     [provider]  predniSONE (DELTASONE) 5 MG tablet Take 5 mg by mouth daily.    [provider]  Rivaroxaban (XARELTO) 15 MG TABS tablet Take 1 tablet (15 mg total) by mouth daily with supper. 01/06/20   Georgette Shell, MD  saccharomyces boulardii (FLORASTOR) 250 MG capsule Take 1 capsule (250 mg total) by mouth 2 (two) times daily. 01/06/20   Georgette Shell, MD  spironolactone (ALDACTONE) 25 MG tablet Take 0.5 tablets (12.5 mg total) by mouth daily. 01/07/20   Georgette Shell, MD    Physical Exam: Vitals:   02/09/20 1450 02/09/20 1520 02/09/20 1539  BP: (!) 96/47 (!) 100/48 (!) 94/50    Pulse: 69 69 69  Resp: 16 18 17   Temp: 97.7 F (36.5 C) 97.7 F (36.5 C) 97.9 F (36.6 C)  TempSrc: Oral Oral Oral  SpO2: 97% 100% 100%    Constitutional:  Appears dehydrated, pale, anxious Eyes: PERRL, lids and conjunctivae normal ENMT: Mucous membranes are dry. Posterior pharynx clear of any exudate or lesions.Normal dentition.  Neck: normal, supple, no masses, no thyromegaly Respiratory: clear to auscultation bilaterally, no wheezing, no crackles. Normal respiratory effort. No accessory muscle use.  Cardiovascular: Regular rate and rhythm, no murmurs / rubs / gallops.  Bilateral 2+ pitting edema positive 2+ pedal pulses. No carotid bruits.  Abdomen: Mild epigastric tenderness positive, no masses palpated. No hepatosplenomegaly. Bowel sounds positive.  Musculoskeletal: no clubbing / cyanosis. No joint deformity upper and lower extremities. Good ROM, no contractures. Normal muscle tone.  Skin: no rashes, lesions, ulcers. No induration Neurologic: CN 2-12 grossly intact. Sensation intact, DTR normal. Strength 5/5 in all 4.  Psychiatric: Normal judgment and insight. Alert and oriented x 3. Normal mood.    Labs on Admission: I have personally reviewed following labs and imaging studies  CBC: Recent Labs  Lab 02/09/20 1317  WBC 6.7  HGB 4.4*  HCT 14.2*  MCV 121.4*  PLT 628   Basic Metabolic Panel: Recent Labs  Lab 02/09/20 1317  NA 129*  K 4.8  CL 101  CO2 20*  GLUCOSE 139*  BUN 35*  CREATININE 1.58*  CALCIUM 8.2*   GFR: CrCl cannot be calculated (Unknown ideal weight.). Liver Function Tests: No results for input(s): AST, ALT, ALKPHOS, BILITOT, PROT, ALBUMIN in the last 168 hours. No results for input(s): LIPASE, AMYLASE in the last 168 hours. No results for input(s): AMMONIA in the last 168 hours. Coagulation Profile: No results for input(s): INR, PROTIME in the last 168 hours. Cardiac Enzymes: No results for input(s): CKTOTAL, CKMB, CKMBINDEX, TROPONINI in the  last 168 hours. BNP (last 3 results) No results for input(s): PROBNP in the last 8760 hours. HbA1C: No results for input(s): HGBA1C in the last 72 hours. CBG: No results for input(s): GLUCAP in the last 168 hours. Lipid Profile: No results for input(s): CHOL, HDL, LDLCALC, TRIG, CHOLHDL, LDLDIRECT in the last 72 hours. Thyroid Function Tests: No results for input(s): TSH, T4TOTAL, FREET4, T3FREE, THYROIDAB in the last 72 hours. Anemia Panel: Recent Labs    02/09/20 1505  RETICCTPCT 13.5*   Urine analysis:    Component Value Date/Time   COLORURINE YELLOW 03/12/2019 1148   APPEARANCEUR CLEAR 03/12/2019 1148   LABSPEC 1.003 (L) 03/12/2019 1148   PHURINE 7.0 03/12/2019 1148   GLUCOSEU NEGATIVE 03/12/2019 1148   HGBUR NEGATIVE 03/12/2019 1148   [place] 03/12/2019 1148   KETONESUR NEGATIVE 03/12/2019 1148   PROTEINUR 30 (A) 03/12/2019 1148   UROBILINOGEN 0.2 09/22/2012 1100   NITRITE POSITIVE (A) 03/12/2019 1148   LEUKOCYTESUR MODERATE (A) 03/12/2019 1148    Radiological Exams on Admission: DG Chest 2 View  Result Date: 02/09/2020 CLINICAL DATA:  Chest pain and shortness of breath for 1 day. EXAM: CHEST - 2 VIEW COMPARISON:  Single-view of the chest 12/31/2019. PA and lateral chest 09/13/2019. FINDINGS: Lung volumes are low but the lungs are clear. Heart size is normal. Atherosclerosis noted. No pneumothorax or pleural effusion. IMPRESSION: No acute disease. Aortic Atherosclerosis (ICD10-I70.0). Electronically Signed   By: Inge Rise M.D.   On: 02/09/2020 14:14    EKG: Independently reviewed.  Normal sinus rhythm, left bundle branch block, no ST elevation or depression noted.  Assessment/Plan Principal Problem:   GI bleeding Active Problems:   Symptomatic anemia   Hyponatremia   Acute on chronic kidney failure (HCC)   Hypertension   Major depressive disorder  Tobacco abuse   Acute blood loss anemia   Hyperlipidemia   COPD (chronic obstructive  pulmonary disease) (HCC)   Macrocytosis    Acute GI bleeding/symptomatic anemia/acute blood loss anemia: -Patient presented with chest pain, shortness of breath and black tarry stool -H&H: 4.4/14.2 was 11.2/31.63-month ago -POC occult blood positive. -Admit patient to stepdown unit for close monitoring. -Received 1 unit PRBC in ED.  CBC every 4 hour.  Transfuse as needed. -Hold Xarelto and aspirin.  Monitor vitals closely. -Continue PPI drip.  Zofran as needed for nausea and vomiting.  We will keep her n.p.o. -EDP consulted GI-plan for EGD tomorrow.  Acute on chronic kidney disease: -Creatinine: 1.58, GFR: 32-baseline creatinine 1.07, GFR: 51 -Likely prerenal due to decreased renal perfusion -Avoid nephrotoxic medication.  Monitor BMP closely.  A. fib: Rate controlled -Hold Xarelto and Coreg for now -On telemetry  Macrocytosis: MCV of 121 -B12 and folate is ordered and is pending  Chronic hyponatremia: Sodium 129 -Likely secondary to Lasix.  Patient is alert and oriented -Continue to monitor  Hyperlipidemia: Continue statin  Depression/anxiety: Continue home meds -Continue Remeron, BuSpar, Xanax as needed  Hypertension: Blood pressure is upon arrival 96/47 -Hold home BP medication: Coreg, hydralazine, Aldactone -Resume home BP medicines once blood pressure stabilizes  Systolic CHF: Due to exacerbation -With ejection fraction of 30 to 35% -Chest x-ray is negative.  BNP: 786-was 3381 1 month ago -Has chronic bilateral leg edema -Hold Lasix and Aldactone for now due to AKI and soft BP. -Strict INO's and daily weight.  Monitor signs for fluid overload. -Resume home meds once her blood pressure and kidney function back to baseline.  COPD: Not in acute exacerbation -No wheezing noted on exam.  Maintaining oxygen saturation of 100% on room air   DVT prophylaxis: SCD/TED Code Status: Full code Family Communication: None present at bedside.  Plan of care discussed with  patient in length and he verbalized understanding and agreed with it. Disposition Plan: To be determined Consults called: GI by EDP Admission status: Inpatient  Mckinley Jewel MD [facility]  If 7PM-7AM, please contact night-coverage [email or web address] Password [facility]  02/09/2020, 3:42 PM

## 2020-02-09 NOTE — Progress Notes (Addendum)
Patient became very anxious stating she can not breathe. M. Sharlet Salina paged and new orders were given. Upon assessing the patient, wheezing was heard and patient's anxiety increased. M. Sharlet Salina was paged due to the patient receiving 3 unit of blood since ED admission. Awaiting new orders.   0000 Patient became increasingly anxious climbing out of bed. Wheezing was heard in all lung bases, Blood was stopped and O2 @ 2 L was started on the patient.   [id number] Patient became increasingly tachypneic HR sustained in the 120s, O2 saturation was 78-82%, patient was placed on a NRB.  Rapid Response was called to bedside as well as M. Denny and respiratory. Duoneb was administered with no affect. Blood pressure was showing severe hypertension   0030 Patient was placed on bipap and ABG was drawn, a total of 40mg  of lasix IV has been administered.   0230 Patient's HR is improving, but still tachypneic O2 is reading 100%, blood pressure has returned within normal range.

## 2020-02-10 ENCOUNTER — Encounter (HOSPITAL_COMMUNITY)
Admission: EM | Disposition: A | Discharge status: Home Health | Payer: Self-pay | Source: Home / Self Care | Attending: Family Medicine

## 2020-02-10 ENCOUNTER — Inpatient Hospital Stay (HOSPITAL_COMMUNITY): Payer: PPO

## 2020-02-10 DIAGNOSIS — Z720 Tobacco use: Secondary | ICD-10-CM

## 2020-02-10 DIAGNOSIS — D649 Anemia, unspecified: Secondary | ICD-10-CM

## 2020-02-10 DIAGNOSIS — D62 Acute posthemorrhagic anemia: Secondary | ICD-10-CM

## 2020-02-10 DIAGNOSIS — J449 Chronic obstructive pulmonary disease, unspecified: Secondary | ICD-10-CM

## 2020-02-10 DIAGNOSIS — N1831 Chronic kidney disease, stage 3a: Secondary | ICD-10-CM

## 2020-02-10 DIAGNOSIS — N179 Acute kidney failure, unspecified: Secondary | ICD-10-CM

## 2020-02-10 LAB — TYPE AND SCREEN
ABO/RH(D): A POS
Antibody Screen: NEGATIVE
Unit division: 0
Unit division: 0
Unit division: 0

## 2020-02-10 LAB — CBC
HCT: 33 % — ABNORMAL LOW (ref 36.0–46.0)
Hemoglobin: 11.3 g/dL — ABNORMAL LOW (ref 12.0–15.0)
MCH: 31.7 pg (ref 26.0–34.0)
MCHC: 34.2 g/dL (ref 30.0–36.0)
MCV: 92.4 fL (ref 80.0–100.0)
Platelets: 169 10*3/uL (ref 150–400)
RBC: 3.57 MIL/uL — ABNORMAL LOW (ref 3.87–5.11)
RDW: 21.7 % — ABNORMAL HIGH (ref 11.5–15.5)
WBC: 11.3 10*3/uL — ABNORMAL HIGH (ref 4.0–10.5)
nRBC: 0.3 % — ABNORMAL HIGH (ref 0.0–0.2)

## 2020-02-10 LAB — BLOOD GAS, ARTERIAL
Acid-base deficit: 7.7 mmol/L — ABNORMAL HIGH (ref 0.0–2.0)
Bicarbonate: 16.7 mmol/L — ABNORMAL LOW (ref 20.0–28.0)
Drawn by: 55062
FIO2: 100
O2 Saturation: 99.1 %
Patient temperature: 36.6
pCO2 arterial: 29.9 mmHg — ABNORMAL LOW (ref 32.0–48.0)
pH, Arterial: 7.364 (ref 7.350–7.450)
pO2, Arterial: 178 mmHg — ABNORMAL HIGH (ref 83.0–108.0)

## 2020-02-10 LAB — COMPREHENSIVE METABOLIC PANEL
ALT: 36 U/L (ref 0–44)
AST: 30 U/L (ref 15–41)
Albumin: 2.8 g/dL — ABNORMAL LOW (ref 3.5–5.0)
Alkaline Phosphatase: 31 U/L — ABNORMAL LOW (ref 38–126)
Anion gap: 7 (ref 5–15)
BUN: 32 mg/dL — ABNORMAL HIGH (ref 8–23)
CO2: 18 mmol/L — ABNORMAL LOW (ref 22–32)
Calcium: 8.1 mg/dL — ABNORMAL LOW (ref 8.9–10.3)
Chloride: 107 mmol/L (ref 98–111)
Creatinine, Ser: 1.46 mg/dL — ABNORMAL HIGH (ref 0.44–1.00)
GFR calc Af Amer: 41 mL/min — ABNORMAL LOW (ref >60)
GFR calc non Af Amer: 35 mL/min — ABNORMAL LOW (ref >60)
Glucose, Bld: 115 mg/dL — ABNORMAL HIGH (ref 70–99)
Potassium: 4.5 mmol/L (ref 3.5–5.1)
Sodium: 132 mmol/L — ABNORMAL LOW (ref 135–145)
Total Bilirubin: 1 mg/dL (ref 0.3–1.2)
Total Protein: 5 g/dL — ABNORMAL LOW (ref 6.5–8.1)

## 2020-02-10 LAB — BPAM RBC
Blood Product Expiration Date: 202106182359
Blood Product Expiration Date: 202106302359
Blood Product Expiration Date: 202107122359
ISSUE DATE / TIME: 202106101501
ISSUE DATE / TIME: 202106101757
ISSUE DATE / TIME: 202106102116
Unit Type and Rh: 5100
Unit Type and Rh: 6200
Unit Type and Rh: 6200

## 2020-02-10 SURGERY — EGD (ESOPHAGOGASTRODUODENOSCOPY)
Anesthesia: Monitor Anesthesia Care

## 2020-02-10 MED ORDER — FUROSEMIDE 10 MG/ML IJ SOLN
INTRAMUSCULAR | Status: AC
  Filled 2020-02-10: qty 2

## 2020-02-10 MED ORDER — ATORVASTATIN CALCIUM 80 MG PO TABS
80.0000 mg | ORAL_TABLET | Freq: Every day | ORAL | Status: DC
  Administered 2020-02-10 – 2020-02-12 (×3): 80 mg via ORAL
  Filled 2020-02-10 (×3): qty 1

## 2020-02-10 MED ORDER — HYDROXYCHLOROQUINE SULFATE 200 MG PO TABS
200.0000 mg | ORAL_TABLET | Freq: Every day | ORAL | Status: DC
  Administered 2020-02-10 – 2020-02-13 (×4): 200 mg via ORAL
  Filled 2020-02-10 (×4): qty 1

## 2020-02-10 MED ORDER — NICOTINE 7 MG/24HR TD PT24: 7.0000 mg | MEDICATED_PATCH | Freq: Every day | TRANSDERMAL | Status: DC

## 2020-02-10 MED ORDER — FERROUS SULFATE 325 (65 FE) MG PO TABS
325.0000 mg | ORAL_TABLET | Freq: Every day | ORAL | Status: DC
  Administered 2020-02-10 – 2020-02-13 (×4): 325 mg via ORAL
  Filled 2020-02-10 (×4): qty 1

## 2020-02-10 MED ORDER — IPRATROPIUM-ALBUTEROL 0.5-2.5 (3) MG/3ML IN SOLN
3.0000 mL | RESPIRATORY_TRACT | Status: DC | PRN
  Administered 2020-02-10: 3 mL via RESPIRATORY_TRACT
  Filled 2020-02-10: qty 3

## 2020-02-10 MED ORDER — FUROSEMIDE 40 MG PO TABS
40.0000 mg | ORAL_TABLET | Freq: Every day | ORAL | Status: DC
  Administered 2020-02-10 – 2020-02-13 (×4): 40 mg via ORAL
  Filled 2020-02-10 (×4): qty 1

## 2020-02-10 MED ORDER — ALPRAZOLAM 0.5 MG PO TABS
0.5000 mg | ORAL_TABLET | Freq: Three times a day (TID) | ORAL | Status: DC | PRN
  Administered 2020-02-10 – 2020-02-13 (×7): 0.5 mg via ORAL
  Filled 2020-02-10 (×7): qty 1

## 2020-02-10 MED ORDER — PREDNISONE 5 MG PO TABS
5.0000 mg | ORAL_TABLET | Freq: Every day | ORAL | Status: DC
  Administered 2020-02-10 – 2020-02-13 (×4): 5 mg via ORAL
  Filled 2020-02-10 (×4): qty 1

## 2020-02-10 MED ORDER — MIRTAZAPINE 15 MG PO TABS
7.5000 mg | ORAL_TABLET | Freq: Every day | ORAL | Status: DC
  Administered 2020-02-10 – 2020-02-12 (×3): 7.5 mg via ORAL
  Filled 2020-02-10 (×3): qty 1

## 2020-02-10 MED ORDER — SPIRONOLACTONE 12.5 MG HALF TABLET
12.5000 mg | ORAL_TABLET | Freq: Every day | ORAL | Status: DC
  Administered 2020-02-10 – 2020-02-13 (×4): 12.5 mg via ORAL
  Filled 2020-02-10 (×4): qty 1

## 2020-02-10 MED ORDER — FUROSEMIDE 10 MG/ML IJ SOLN
20.0000 mg | Freq: Once | INTRAMUSCULAR | Status: AC
  Administered 2020-02-10: 20 mg via INTRAVENOUS

## 2020-02-10 MED ORDER — BUSPIRONE HCL 10 MG PO TABS
30.0000 mg | ORAL_TABLET | Freq: Two times a day (BID) | ORAL | Status: DC
  Administered 2020-02-10 – 2020-02-13 (×7): 30 mg via ORAL
  Filled 2020-02-10 (×8): qty 3

## 2020-02-10 NOTE — Progress Notes (Signed)
[facility] Gastroenterology Progress Note  Azariya Freeman 74 y.o. 1946-02-02   Subjective: Feels short of breath and anxious. Put on BiPap overnight and currently on 4 L N/C.  Objective: Vital signs: Vitals:   02/10/20 0424 02/10/20 0744  BP: 134/79   Pulse: 92 96  Resp: (!) 23   Temp: (!) 97.5 F (36.4 C) 98.9 F (37.2 C)  SpO2: 99% (!) 4%    Physical Exam: Gen: lethargic, elderly, thin, mild acute distress  HEENT: anicteric sclera CV: RRR Chest: Coarse breath sounds Abd: soft, nontender, nondistended, +BS Ext: no edema  Lab Results: Recent Labs    02/09/20 1317 02/10/20 0220  NA 129* 132*  K 4.8 4.5  CL 101 107  CO2 20* 18*  GLUCOSE 139* 115*  BUN 35* 32*  CREATININE 1.58* 1.46*  CALCIUM 8.2* 8.1*   Recent Labs    02/10/20 0220  AST 30  ALT 36  ALKPHOS 31*  BILITOT 1.0  PROT 5.0*  ALBUMIN 2.8*   Recent Labs    02/09/20 1746 02/10/20 0220  WBC 5.5 11.3*  HGB 6.1* 11.3*  HCT 19.1* 33.0*  MCV 103.2* 92.4  PLT 149* 169      Assessment/Plan: Melenic stools on Xarelto. Hgb increase from 4.4 to 11.3 after 1.5 units of blood which means one of these Hgbs is not accurate. Developed pulmonary edema following blood transfusions requiring BiPap and IV Lasix. Cancelling planned EGD for today due to pulmonary edema with respiratory distress overnight. No signs of ongoing bleeding. Continue to hold Xarelto. NPO except ice chips ok. [facility] will f/u tomorrow and decide on timing of EGD.   Lear Ng 02/10/2020, 9:51 AM  Questions please call [phone] ID: [name], female   DOB: 04-28-1946, 74 y.o.   MRN: [id number]

## 2020-02-10 NOTE — Progress Notes (Signed)
RT NOTES: Removed patient from bipap and placed on 4lpm nasal cannula. Patient tolerating well. Sats 97%. Will continue to monitor.

## 2020-02-10 NOTE — Progress Notes (Signed)
PROGRESS NOTE    Regina Ortiz  ZOX:[id number] DOB: [birth year] DOA: 02/09/2020 PCP: Gayland Curry, DO      Brief Narrative:  [name] is a 73 y.o. F with hx RA on Plaquenil, A. Fib on Xarelto, sCHF EF 30-35%, depression, HTN, and COPD not on O2 who presented with 2 days black tarry stools.  Finally, had black diarrhea, was too weak to get up from commode, came to ER.  In the ER, Hgb 4.4 and FOBT+.  BP soft.  Given fluids, blood and the hospitalist service were consulted for GI bleed.          Assessment & Plan:  Acute GI bleed -Hold Xarelto, aspirin -Continue IV PPI -Consult GI for endoscopy, appreciate cares   Acute blood loss anemia Hgb 4.4 on admission.  Given 1 1/2 units overnight and developed pulmonary edema.  Interestingly, Hgb came up to 11 g/dL with just 1 1/2 units PRBCs. -Trend Hgb -Hgb transfusion threshold 7 g/dL unless signs of ischemia, renal underperfusion or worsening CHF  Acute on chronic systolic CHF Given 1 1/2 units PRBCs overnight and developed new respiratory distress. CXR showed new infiltrates.  Started on Lasix and BiPAP and symptoms improved. -Continue home statin -Hold home aspirin -Resume home Lasix, spironolactone -Hold carvedilol until hemodynamics clearer -Not on ARB, unclear reason  Atrial fibrillation, chronic -Hold Xarelto -Hold carvedilol until hemodynamics clearer  Depression/Anxiety -Continue Xanax, BuSpar, mirtazapine  Hypertension BP has come up -Hold carvedilol until hemodynamics clearer  Smoking COPD Cessation recommended.   -Continue nicotine patch  Rheumatoid arthritis -Continue Plaquenil, daily prednisone  Stage I pressure injury, sacrum, POA  Hyponatremia Mild, asymptomatic -Trend Na  AKI on CKD IIIa Baseline Cr 1.1, currently up to 1.5 due to fluid overload         Disposition: Status is: Inpatient  Remains inpatient appropriate because:degree of anemia, hypoxic respiratory  failure this mornign requiring BiPAP   Dispo: The patient is from: Home              Anticipated d/c is to: SNF              Anticipated d/c date is: 3 days              Patient currently is not medically stable to d/c.              MDM: The below labs and imaging reports were reviewed and summarized above.  Medication management as above.   DVT prophylaxis: SCDs Start: 02/09/20 1539 Place TED hose Start: 02/09/20 1539  Code Status: FULL Family Communication: None    Consultants:   GI  Procedures:   6/11 endoscopy  Antimicrobials:      Culture data:              Subjective: The patient is anxious and short of breath.  She has had no further melena this morning.  She has no swelling, confusion, fever.  Objective: Vitals:   02/10/20 0307 02/10/20 0359 02/10/20 0424 02/10/20 0744  BP:  134/79 134/79   Pulse: 95 85 92 96  Resp: (!) 23 20 (!) 23   Temp:  98 F (36.7 C) (!) 97.5 F (36.4 C) 98.9 F (37.2 C)  TempSrc:  Axillary Axillary Oral  SpO2: 100%  99% (!) 4%    Intake/Output Summary (Last 24 hours) at 02/10/2020 0932 Last data filed at 02/10/2020 0332 Gross per 24 hour  Intake 294.5 ml  Output 650 ml  Net -  355.5 ml   There were no vitals filed for this visit.  Examination: General appearance: Elderly adult female, alert and in mild distress from anxiety, shortness of breath.   HEENT: Anicteric, conjunctiva pale, lids and lashes normal. No nasal deformity, discharge, epistaxis.  Lips moist, dentures in place, thin film on tongue, no oral lesions, oropharynx moist, hearing normal.   Skin: Warm and dry.  Pale, no jaundice.  No suspicious rashes or lesions. Cardiac: RRR, nl S1-S2, no murmurs appreciated.  Capillary refill is brisk.  JVP normal.  No LE edema.  Radial pulses 2+ and symmetric. Respiratory:, No wheezing, rales at the bases bilaterally.  Tachypneic  Abdomen: Abdomen soft.  No TTP or guarding. No ascites, distension,  hepatosplenomegaly.   MSK: No deformities or effusions. Neuro: Awake and alert.  EOMI, moves all extremities. Speech fluent.    Psych: Sensorium intact and responding to questions, attention normal. Affect anxious.  Judgment and insight appear normal.    Data Reviewed: I have personally reviewed following labs and imaging studies:  CBC: Recent Labs  Lab 02/09/20 1317 02/09/20 1746 02/10/20 0220  WBC 6.7 5.5 11.3*  HGB 4.4* 6.1* 11.3*  HCT 14.2* 19.1* 33.0*  MCV 121.4* 103.2* 92.4  PLT 174 149* 712   Basic Metabolic Panel: Recent Labs  Lab 02/09/20 1317 02/10/20 0220  NA 129* 132*  K 4.8 4.5  CL 101 107  CO2 20* 18*  GLUCOSE 139* 115*  BUN 35* 32*  CREATININE 1.58* 1.46*  CALCIUM 8.2* 8.1*   GFR: CrCl cannot be calculated (Unknown ideal weight.). Liver Function Tests: Recent Labs  Lab 02/10/20 0220  AST 30  ALT 36  ALKPHOS 31*  BILITOT 1.0  PROT 5.0*  ALBUMIN 2.8*   No results for input(s): LIPASE, AMYLASE in the last 168 hours. No results for input(s): AMMONIA in the last 168 hours. Coagulation Profile: No results for input(s): INR, PROTIME in the last 168 hours. Cardiac Enzymes: No results for input(s): CKTOTAL, CKMB, CKMBINDEX, TROPONINI in the last 168 hours. BNP (last 3 results) No results for input(s): PROBNP in the last 8760 hours. HbA1C: No results for input(s): HGBA1C in the last 72 hours. CBG: No results for input(s): GLUCAP in the last 168 hours. Lipid Profile: No results for input(s): CHOL, HDL, LDLCALC, TRIG, CHOLHDL, LDLDIRECT in the last 72 hours. Thyroid Function Tests: No results for input(s): TSH, T4TOTAL, FREET4, T3FREE, THYROIDAB in the last 72 hours. Anemia Panel: Recent Labs    02/09/20 1505  VITAMINB12 443  FOLATE 24.8  FERRITIN 33  TIBC 326  IRON 41  RETICCTPCT 13.5*   Urine analysis:    Component Value Date/Time   COLORURINE YELLOW 03/12/2019 1148   APPEARANCEUR CLEAR 03/12/2019 1148   LABSPEC 1.003 (L) 03/12/2019  1148   PHURINE 7.0 03/12/2019 1148   GLUCOSEU NEGATIVE 03/12/2019 1148   HGBUR NEGATIVE 03/12/2019 1148   BILIRUBINUR NEGATIVE 03/12/2019 1148   KETONESUR NEGATIVE 03/12/2019 1148   PROTEINUR 30 (A) 03/12/2019 1148   UROBILINOGEN 0.2 09/22/2012 1100   NITRITE POSITIVE (A) 03/12/2019 1148   LEUKOCYTESUR MODERATE (A) 03/12/2019 1148   Sepsis Labs: @LABRCNTIP (procalcitonin:4,lacticacidven:4)  ) Recent Results (from the past 240 hour(s))  SARS Coronavirus 2 by RT PCR (hospital order, performed in [place] hospital lab) Nasopharyngeal Nasopharyngeal Swab     Status: None   Collection Time: 02/09/20  5:35 PM   Specimen: Nasopharyngeal Swab  Result Value Ref Range Status   SARS Coronavirus 2 NEGATIVE NEGATIVE Final  Comment: (NOTE) SARS-CoV-2 target nucleic acids are NOT DETECTED.  The SARS-CoV-2 RNA is generally detectable in upper and lower respiratory specimens during the acute phase of infection. The lowest concentration of SARS-CoV-2 viral copies this assay can detect is 250 copies / mL. A negative result does not preclude SARS-CoV-2 infection and should not be used as the sole basis for treatment or other patient management decisions.  A negative result may occur with improper specimen collection / handling, submission of specimen other than nasopharyngeal swab, presence of viral mutation(s) within the areas targeted by this assay, and inadequate number of viral copies (<250 copies / mL). A negative result must be combined with clinical observations, patient history, and epidemiological information.  Fact Sheet for Patients:   StrictlyIdeas.no  Fact Sheet for Healthcare Providers: BankingDealers.co.za  This test is not yet approved or  cleared by the [place] FDA and has been authorized for detection and/or diagnosis of SARS-CoV-2 by FDA under an Emergency Use Authorization (EUA).  This EUA will remain in effect  (meaning this test can be used) for the duration of the COVID-19 declaration under Section 564(b)(1) of the Act, 21 U.S.C. section 360bbb-3(b)(1), unless the authorization is terminated or revoked sooner.  Performed at [facility], [place] [address] [place], [place] [address]          Radiology Studies: DG Chest 2 View  Result Date: 02/09/2020 CLINICAL DATA:  Chest pain and shortness of breath for 1 day. EXAM: CHEST - 2 VIEW COMPARISON:  Single-view of the chest 12/31/2019. PA and lateral chest 09/13/2019. FINDINGS: Lung volumes are low but the lungs are clear. Heart size is normal. Atherosclerosis noted. No pneumothorax or pleural effusion. IMPRESSION: No acute disease. Aortic Atherosclerosis (ICD10-I70.0). Electronically Signed   By: Inge Rise M.D.   On: 02/09/2020 14:14   DG Chest Port 1 View  Result Date: 02/10/2020 CLINICAL DATA:  Chest acute respiratory distress, on CPAP EXAM: PORTABLE CHEST 1 VIEW COMPARISON:  Radiograph 02/09/2020, 06/22/2012 FINDINGS: There has been rapid interval progression of the heterogeneous interstitial and airspace opacity throughout both lungs, right greater than left with some associated interlobular septal thickening and vascular congestion. Cardiomediastinal silhouette is similar to prior accounting for differences in inflation in technique. The aorta is calcified. The remaining cardiomediastinal contours are unremarkable. Telemetry leads overlie the chest. Gaseous distention of the stomach, nonspecific. No other acute osseous or soft tissue abnormality. Degenerative changes are present in the imaged spine and shoulders. Vascular calcium in the left carotid and right axillary region. IMPRESSION: Rapid interval progression of the heterogeneous interstitial and airspace opacity throughout both lungs, right greater than left, with associated vascular congestion and interlobular septal thickening. Findings could reflect rapidly worsening and slightly  asymmetric pulmonary edema or ARDS. Infection is less favored though should be excluded clinically. Electronically Signed   By: Lovena Le M.D.   On: 02/10/2020 00:55        Scheduled Meds: . atorvastatin  80 mg Oral Daily  . busPIRone  30 mg Oral BID  . [START ON 02/11/2020] ferrous sulfate  325 mg Oral Q breakfast  . furosemide  40 mg Oral Daily  . hydroxychloroquine  200 mg Oral Daily  . LORazepam  1 mg Intravenous Once  . mirtazapine  7.5 mg Oral QHS  . predniSONE  5 mg Oral Daily  . sodium chloride flush  3 mL Intravenous Once  . spironolactone  12.5 mg Oral Daily   Continuous Infusions: . pantoprozole (PROTONIX) infusion  8 mg/hr (02/10/20 0211)     LOS: 1 day    Time spent: 73 mnutes    Edwin Dada, MD [facility] 02/10/2020, 9:32 AM     Please page though [place] or Epic secure chat:  For [employer], Adult nurse

## 2020-02-10 NOTE — Significant Event (Signed)
Rapid Response Event Note  Overview: Respiratory failure  Initial Focused Assessment: Nursing staff notified me regarding pt in respiratory distress. Upon arrival, pt is in severe distress with tachypnea and accessory muscle use. Pt's sats on NRB 15L 73%-80%. Pt immediately placed to BIPAP per RT and PCXR ordered. M. Sharlet Salina previously contacted by staff and Lasix ordered. M. Sharlet Salina came to bedside. PCXR obtained consistent with CHF with worse pulmonary edema on right. BBS Coarse rales throughout and more pronounced on right. Skin is ecchymotic, pitting edema, warm, pink and diaphoretic. +JVD.   HR 120s ST, 165/101 (119), RR 30-34 with sats 100% on BIPAP 20/8 and 100% Fio2.   ABG- 7.36/30/178/17 after being on BIPAP for about 45 mins  Interventions: -STAT BIPAP per RT settings -STAT PCXR -ABG in 30 mins -Lasix per previous order -New PIV  Plan of Care:  -Monitor Strict I and O status, assess response to lasix -Continue on BIPAP for now -ABG in 30 mins and call results to provider  Event Summary: Call received 0002 (I was with another pt) Arrived at call 0022 Call ended [phone]  Regina Ortiz

## 2020-02-10 NOTE — Evaluation (Signed)
Physical Therapy Evaluation Patient Details Name: Regina Ortiz MRN: [id number] DOB: [birth year] Today's Date: 02/10/2020   History of Present Illness  74 yo female with onset of pulm edema, with GI bleed requiring transfusion and has AKI with setting of CKD3.  Has sacral pressure ulcer, has been on O2 at hosp and had rapid response overnight from anxiety and struggle to breathe.  PMHx:  acute respiratory failure with tachypnea, Afib, CHF, Cardiac cath 5/4, HTN, COPD, LBBB, hyponatremia, RA, Raynaud's, cauda equina syndrome with neurogenic bladder, anxiety, depression  Clinical Impression  Pt was seen for mobility despite her anxiety with movement, and was able to stand and attempt steps with HHA and nearby walker.  Follow up with all mobility needs, to work on standing endurance and LE strengthening.  Will follow acutely knowing pt and family may elect to take her home and will manage her care accordingly.     Follow Up Recommendations SNF;Supervision for mobility/OOB    Equipment Recommendations  None recommended by PT    Recommendations for Other Services       Precautions / Restrictions Precautions Precautions: Fall Precaution Comments: monitor vitals Restrictions Weight Bearing Restrictions: No      Mobility  Bed Mobility Overal bed mobility: Needs Assistance Bed Mobility: Supine to Sit;Sit to Supine     Supine to sit: Mod assist Sit to supine: Mod assist   General bed mobility comments: assist to support scooting out and to EOB  Transfers Overall transfer level: Needs assistance Equipment used: Rolling walker (2 wheeled);1 person hand held assist Transfers: Sit to/from Stand Sit to Stand: Mod assist         General transfer comment: mod to power up then min to maintain  Ambulation/Gait             General Gait Details: unable to take a step  Stairs            Wheelchair Mobility    Modified Rankin (Stroke Patients Only)       Balance  Overall balance assessment: Needs assistance Sitting-balance support: Feet supported;Bilateral upper extremity supported Sitting balance-Leahy Scale: Fair     Standing balance support: Bilateral upper extremity supported;During functional activity Standing balance-Leahy Scale: Poor Standing balance comment: poor requiring small amount of assist to control standing balance                             Pertinent Vitals/Pain Pain Assessment: Faces Faces Pain Scale: Hurts a little bit Pain Location: generalized Pain Intervention(s): Monitored during session;Other (comment) (asking for anxiety meds)    Home Living Family/patient expects to be discharged to:: Private residence Living Arrangements: Alone;Other (Comment) (caregiver help) Available Help at Discharge: Personal care attendant;Available PRN/intermittently Type of Home: House Home Access: Stairs to enter Entrance Stairs-Rails: Chemical engineer of Steps: 3 Home Layout: Two level;Able to live on main level with bedroom/bathroom;1/2 bath on main level Home Equipment: Walker - 2 wheels;Bedside commode;Walker - 4 wheels Additional Comments: caregiver roughly 8 hrs a day for homemaking and , supervision for sponge bath pt doesn't go upstairs to shower    Prior Function Level of Independence: Needs assistance   Gait / Transfers Assistance Needed: rollator for room to room trips  ADL's / Homemaking Assistance Needed: aide helps with dressing and bathing, sponge bathing as tub upstairs  Comments: has been able to walk and bathe herself but requires help on back     Hand Dominance  Dominant Hand: Right    Extremity/Trunk Assessment   Upper Extremity Assessment Upper Extremity Assessment: Generalized weakness    Lower Extremity Assessment Lower Extremity Assessment: Generalized weakness    Cervical / Trunk Assessment Cervical / Trunk Assessment: Kyphotic  Communication   Communication: No  difficulties  Cognition Arousal/Alertness: Awake/alert Behavior During Therapy: Flat affect Overall Cognitive Status: Within Functional Limits for tasks assessed                                 General Comments: able to follow instructions, generally weak      General Comments General comments (skin integrity, edema, etc.): Pt was able to control her standing balance with HHA, did not use RW initially    Exercises     Assessment/Plan    PT Assessment Patient needs continued PT services  PT Problem List Decreased strength;Decreased range of motion;Decreased activity tolerance;Decreased balance;Decreased mobility;Decreased coordination;Decreased knowledge of use of DME;Decreased safety awareness;Cardiopulmonary status limiting activity;Decreased skin integrity;Pain       PT Treatment Interventions DME instruction;Gait training;Stair training;Functional mobility training;Therapeutic activities;Therapeutic exercise;Balance training;Neuromuscular re-education;Patient/family education    PT Goals (Current goals can be found in the Care Plan section)  Acute Rehab PT Goals Patient Stated Goal: to feel less anxious PT Goal Formulation: With patient/family Time For Goal Achievement: 02/24/20 Potential to Achieve Goals: Good    Frequency Min 3X/week   Barriers to discharge Decreased caregiver support;Inaccessible home environment has upstairs to reach when able, home alone 16 hours a day    Co-evaluation               AM-PAC PT "6 Clicks" Mobility  Outcome Measure Help needed turning from your back to your side while in a flat bed without using bedrails?: A Little Help needed moving from lying on your back to sitting on the side of a flat bed without using bedrails?: A Little Help needed moving to and from a bed to a chair (including a wheelchair)?: A Little Help needed standing up from a chair using your arms (e.g., wheelchair or bedside chair)?: A Lot Help  needed to walk in hospital room?: Total Help needed climbing 3-5 steps with a railing? : Total 6 Click Score: 13    End of Session Equipment Utilized During Treatment: Gait belt;Oxygen Activity Tolerance: Patient limited by fatigue;Treatment limited secondary to medical complications (Comment) Patient left: in bed;with call bell/phone within reach;with bed alarm set;with family/visitor present Nurse Communication: Mobility status PT Visit Diagnosis: Unsteadiness on feet (R26.81);Muscle weakness (generalized) (M62.81)    Time: 1525-1550 PT Time Calculation (min) (ACUTE ONLY): 25 min   Charges:   PT Evaluation $PT Eval Moderate Complexity: 1 Mod PT Treatments $Therapeutic Activity: 8-22 mins       Ramond Dial 02/10/2020, 5:22 PM   Mee Hives, PT MS Acute Rehab Dept. Number: [place] [facility] [place]

## 2020-02-10 NOTE — Progress Notes (Signed)
Initial Nutrition Assessment  DOCUMENTATION CODES:   Not applicable  INTERVENTION:   -RD will follow for diet advancement and add supplements as appropriate  NUTRITION DIAGNOSIS:   Inadequate oral intake related to inability to eat as evidenced by NPO status.  GOAL:   Patient will meet greater than or equal to 90% of their needs  MONITOR:   PO intake, Diet advancement, Labs, Weight trends, Skin, I & O's  REASON FOR ASSESSMENT:   Malnutrition Screening Tool    ASSESSMENT:   Regina Ortiz is a 74 y.o. female with medical history significant of A. fib on Xarelto, COPD, anxiety, CHF with ejection fraction of 30 to 35%, GERD, depression presented with midsternal chest pain, shortness of breath and black tarry stool since 2 days.  Pt admitted with acute GIB, symptomatic anemia, and acute blood loss anemia.  Reviewed I/O's: -356 ml x 24 hours  UOP: 650 ml x 24 hours  Pt in with MD at time of visit. RD unable to obtain further nutrition-related history or complete nutrition-related history at this time.   Per GI notes, with pulmonary edema and will defer EGD to a later date due to respiratory distress. May pursue EGD tomorrow depending on pt status. She remains NPO.  No wt recorded since 01/16/20, so unsure if pt has lost weight PTA.   Medications reviewed and include ativan and protonix.  Labs reviewed: Na: 132.   Diet Order:   Diet Order            Diet NPO time specified Except for: Ice Chips  Diet effective now                 EDUCATION NEEDS:   No education needs have been identified at this time  Skin:  Skin Assessment: Reviewed RN Assessment  Last BM:  Unknown  Height:   Ht Readings from Last 1 Encounters:  01/16/20 5\' 5"  (1.651 m)    Weight:   Wt Readings from Last 1 Encounters:  01/16/20 43.3 kg    Ideal Body Weight:  56.8 kg  BMI:  There is no height or weight on file to calculate BMI.  Estimated Nutritional Needs:   Kcal:   1500-1700  Protein:  70-85 grams  Fluid:  > 1.5 L    Loistine Chance, RD, LDN, [place] Registered Dietitian II Certified Diabetes Care and Education Specialist Please refer to [facility] for RD and/or RD on-call/weekend/after hours pager

## 2020-02-10 NOTE — Progress Notes (Addendum)
RN paged NP.  Audible wheezing noted halfway through 3rd unit of blood.  Blood was slowed, then halted as wheezing became worse.  No improvement with duoneb.  Pt did not receive lasix between units of blood and has a hx of CHF.  Pt very anxious and had desats to the 70s after blood was stopped.  NRB and then Bi-Pap started.  CXR findings:   IMPRESSION: Rapid interval progression of the heterogeneous interstitial and airspace opacity throughout both lungs, right greater than left, with associated vascular congestion and interlobular septal thickening. Findings could reflect rapidly worsening and slightly asymmetric pulmonary edema or ARDS. Infection is less favored though should be excluded clinically.  Lasix 20mg  with minimal improvement.  Patient required straight cath with over 59mL return.  A second dose of Lasix 20 mg ordered.  Foley ordered if needed.

## 2020-02-11 ENCOUNTER — Encounter (HOSPITAL_COMMUNITY)
Admission: EM | Disposition: A | Discharge status: Home Health | Payer: Self-pay | Source: Home / Self Care | Attending: Family Medicine

## 2020-02-11 ENCOUNTER — Encounter (HOSPITAL_COMMUNITY): Payer: Self-pay | Admitting: Internal Medicine

## 2020-02-11 ENCOUNTER — Inpatient Hospital Stay (HOSPITAL_COMMUNITY): Payer: PPO | Admitting: Certified Registered Nurse Anesthetist

## 2020-02-11 HISTORY — PX: ESOPHAGOGASTRODUODENOSCOPY (EGD) WITH PROPOFOL: SHX5813

## 2020-02-11 LAB — BASIC METABOLIC PANEL
Anion gap: 9 (ref 5–15)
BUN: 24 mg/dL — ABNORMAL HIGH (ref 8–23)
CO2: 18 mmol/L — ABNORMAL LOW (ref 22–32)
Calcium: 7.6 mg/dL — ABNORMAL LOW (ref 8.9–10.3)
Chloride: 106 mmol/L (ref 98–111)
Creatinine, Ser: 1.26 mg/dL — ABNORMAL HIGH (ref 0.44–1.00)
GFR calc Af Amer: 49 mL/min — ABNORMAL LOW (ref >60)
GFR calc non Af Amer: 42 mL/min — ABNORMAL LOW (ref >60)
Glucose, Bld: 83 mg/dL (ref 70–99)
Potassium: 4 mmol/L (ref 3.5–5.1)
Sodium: 133 mmol/L — ABNORMAL LOW (ref 135–145)

## 2020-02-11 LAB — CBC
HCT: 30.3 % — ABNORMAL LOW (ref 36.0–46.0)
Hemoglobin: 10.3 g/dL — ABNORMAL LOW (ref 12.0–15.0)
MCH: 32 pg (ref 26.0–34.0)
MCHC: 34 g/dL (ref 30.0–36.0)
MCV: 94.1 fL (ref 80.0–100.0)
Platelets: 158 10*3/uL (ref 150–400)
RBC: 3.22 MIL/uL — ABNORMAL LOW (ref 3.87–5.11)
RDW: 21.8 % — ABNORMAL HIGH (ref 11.5–15.5)
WBC: 6.3 10*3/uL (ref 4.0–10.5)
nRBC: 0 % (ref 0.0–0.2)

## 2020-02-11 SURGERY — ESOPHAGOGASTRODUODENOSCOPY (EGD) WITH PROPOFOL
Anesthesia: Monitor Anesthesia Care

## 2020-02-11 MED ORDER — PROPOFOL 10 MG/ML IV BOLUS
INTRAVENOUS | Status: DC | PRN
  Administered 2020-02-11: 20 mg via INTRAVENOUS

## 2020-02-11 MED ORDER — SODIUM CHLORIDE 0.9 % IV SOLN: INTRAVENOUS | Status: DC

## 2020-02-11 MED ORDER — CARVEDILOL 6.25 MG PO TABS
6.2500 mg | ORAL_TABLET | Freq: Two times a day (BID) | ORAL | Status: DC
  Administered 2020-02-11 – 2020-02-12 (×3): 6.25 mg via ORAL
  Filled 2020-02-11 (×3): qty 1

## 2020-02-11 MED ORDER — RIVAROXABAN 15 MG PO TABS
15.0000 mg | ORAL_TABLET | Freq: Every day | ORAL | Status: DC
  Administered 2020-02-11 – 2020-02-12 (×3): 15 mg via ORAL
  Filled 2020-02-11 (×3): qty 1

## 2020-02-11 MED ORDER — PROPOFOL 500 MG/50ML IV EMUL
INTRAVENOUS | Status: DC | PRN
  Administered 2020-02-11: 100 ug/kg/min via INTRAVENOUS

## 2020-02-11 MED ORDER — LACTATED RINGERS IV SOLN
INTRAVENOUS | Status: AC | PRN
  Administered 2020-02-11: 10 mL/h via INTRAVENOUS

## 2020-02-11 MED ORDER — ASPIRIN EC 81 MG PO TBEC
81.0000 mg | DELAYED_RELEASE_TABLET | Freq: Every day | ORAL | Status: DC
  Administered 2020-02-12 – 2020-02-13 (×2): 81 mg via ORAL
  Filled 2020-02-11 (×2): qty 1

## 2020-02-11 SURGICAL SUPPLY — 14 items

## 2020-02-11 NOTE — Progress Notes (Signed)
Patient seen and examined, and case discussed with Dr. Norina Buzzard, hospitalist.  Following diuresis, the patient's respiratory status is back to baseline.  She is on room air with a saturation of about 95% she is in no respiratory distress.  The patient has not had further melena for approximately 3 days, and although her hemoglobin shows a slight delusional drop overnight from 11.3 to 10.3, during that same time her BUN has declined, suggesting the absence of any ongoing upper tract bleeding.  Since the patient was on aspirin and Xarelto prior to admission, she is at risk for ulcers.  She indicates that her most recent endoscopy, which did not show active ulcer disease, was done prior to being put back on aspirin.  The patient had endoscopic evaluation here in the hospital back in December which showed multiple erosions; it is possible she has had a subsequent endoscopy (based on the GI consultation note) but I am currently unable to locate that record (perhaps it was done as an outpatient, but the patient does not endorse having had an outpatient endoscopy).  In any event, I discussed with the patient the advantages and disadvantages of endoscopic evaluation versus observation with empiric medical therapy.  Both the patient and I feel that endoscopic evaluation would be a good idea, to help clarify the origin of the patient's recent bleeding, to determine her current clinical status and risk of further bleeding, and most importantly, to guide medical therapy going forward, since this patient may require ongoing anticoagulation therapy.  I reviewed the risks of the procedure with particular reference to anesthesia risk given her low ejection fraction and COPD, but it appears she tolerated endoscopy in the recent past without a significant problem.    Accordingly, we will proceed endoscopic evaluation today.  Cleotis Nipper, M.D. Pager [phone] If no answer or after 5 PM call  [phone]

## 2020-02-11 NOTE — Interval H&P Note (Signed)
History and Physical Interval Note:  02/11/2020 2:16 PM  Regina Ortiz  has presented today for surgery, with the diagnosis of melena.  The various methods of treatment have been discussed with the patient. After consideration of risks, benefits and other options for treatment, the patient has consented to  Procedure(s): ESOPHAGOGASTRODUODENOSCOPY (EGD) WITH PROPOFOL (N/A) as a surgical intervention.  The patient's history has been reviewed, patient examined, no change in status, stable for surgery.  I have reviewed the patient's chart and labs.  Questions were answered to the patient's satisfaction.     Youlanda Mighty Kaliann Coryell

## 2020-02-11 NOTE — Transfer of Care (Signed)
Immediate Anesthesia Transfer of Care Note  Patient: Regina Ortiz  Procedure(s) Performed: ESOPHAGOGASTRODUODENOSCOPY (EGD) WITH PROPOFOL (N/A )  Patient Location: Endoscopy Unit  Anesthesia Type:MAC  Level of Consciousness: sedated  Airway & Oxygen Therapy: Patient Spontanous Breathing and Patient connected to nasal cannula oxygen  Post-op Assessment: Report given to RN and Post -op Vital signs reviewed and stable  Post vital signs: Reviewed and stable  Last Vitals:  Vitals Value Taken Time  BP 140/51 02/11/20 1443  Temp    Pulse 65 02/11/20 1444  Resp 16 02/11/20 1444  SpO2 97 % 02/11/20 1444  Vitals shown include unvalidated device data.  Last Pain:  Vitals:   02/11/20 1345  TempSrc: Oral  PainSc: 0-No pain         Complications: No complications documented.

## 2020-02-11 NOTE — Social Work (Signed)
CSW acknowledging rec for SNF placement. Aware pt from home w/ private caregivers.  TOC team to follow up with family to discuss for therapy recommendations needed to best determine disposition/for insurance authorization.   Westley Hummer, MSW, [place] Work

## 2020-02-11 NOTE — NC FL2 (Signed)
[place] LEVEL OF CARE SCREENING TOOL     IDENTIFICATION  Patient Name: Regina Ortiz Birthdate: 09-02-1945 Sex: female Admission Date (Current Location): 02/09/2020  [facility] and [place] Number:  Herbalist and Address:  [facility] [place], [place] [address], [place], [place] [address]      Provider Number: [id number]  Attending Physician Name and Address:  Edwin Dada, *  Relative Name and Phone Number:       Current Level of Care: Hospital Recommended Level of Care: [place] Prior Approval Number:    Date Approved/Denied:   PASRR Number: [id number]  Discharge Plan: SNF    Current Diagnoses: Patient Active Problem List   Diagnosis Date Noted  . Macrocytosis 02/09/2020  . GI bleeding 02/09/2020  . Acute on chronic combined systolic and diastolic CHF (congestive heart failure) ([place]) 01/03/2020  . Acute pulmonary edema (HCC)   . Acute respiratory failure with hypoxia ([place]) 12/31/2019  . COPD (chronic obstructive pulmonary disease) ([place]) 12/31/2019  . Elevated troponin 12/31/2019  . Osteopenia 11/01/2019  . Protein-calorie malnutrition, severe 08/19/2019  . Urinary frequency 04/15/2019  . Senile osteoporosis   . Hyperlipidemia   . Adrenal insufficiency ([place]) 04/13/2019  . Uncontrolled hypertension 04/13/2019  . Bilateral lower extremity edema 04/13/2019  . UTI (urinary tract infection) 04/02/2019  . Pain on right lower extremity 03/31/2019  . Acute blood loss anemia 03/21/2019  . Pressure injury of skin 03/15/2019  . Fracture of femoral neck, right, closed ([place]) 03/14/2019  . Fall at home, initial encounter 03/12/2019  . Urinary retention 02/15/2019  . Lumbosacral radiculopathy 02/15/2019  . Constipation 02/15/2019  . Acute urinary retention 02/15/2019  . Cauda equina syndrome ([place]) 01/28/2019  . Chest pain 09/25/2017  . Hypertensive urgency 09/24/2017  . Headache 09/24/2017  . Alcohol  use 09/24/2017  . AKI (acute kidney injury) ([place]) 09/24/2017  . Bilateral carotid artery stenosis   . Vulvovaginal candidiasis 10/22/2015  . Mild malnutrition ([place]) 07/07/2013  . Chronic bronchitis ([place]) 07/07/2013  . Tobacco abuse 07/07/2013  . Hypertension   . Short of breath on exertion   . Hemorrhoid   . LBBB (left bundle branch block)   . Anxiety   . Major depressive disorder   . Rheumatoid arthritis ([place])   . Venous insufficiency of leg   . Chronic hyponatremia   . AIN grade I 04/05/2013  . Diarrhea 09/22/2012  . Hyponatremia 09/22/2012  . Hypokalemia 09/22/2012  . Acute on chronic kidney failure ([place]) 09/22/2012  . Atypical chest pain 06/22/2012  . Symptomatic anemia 06/22/2012  . Vulvar intraepithelial neoplasia III (VIN III) 01/29/2012    Orientation RESPIRATION BLADDER Height & Weight     Self, Time, Situation, Place (WDL)  Normal Incontinent, External catheter Weight: 105 lb 2.6 oz (47.7 kg) Height:     BEHAVIORAL SYMPTOMS/MOOD NEUROLOGICAL BOWEL NUTRITION STATUS        Diet (see discharge summary)  AMBULATORY STATUS COMMUNICATION OF NEEDS Skin   Extensive Assist Verbally Skin abrasions (skin tear on right arm w/ gauze and foam)                       Personal Care Assistance Level of Assistance  Bathing, Feeding, Dressing Bathing Assistance: Maximum assistance Feeding assistance: Independent Dressing Assistance: Maximum assistance     Functional Limitations Info  Sight, Hearing, Speech Sight Info: Adequate Hearing Info: Adequate Speech Info: Adequate    SPECIAL CARE FACTORS FREQUENCY  PT (By licensed PT), OT (By licensed OT)     PT Frequency: 5x week OT Frequency: 5x week            Contractures Contractures Info: Not present    Additional Factors Info  Code Status, Allergies, Psychotropic Code Status Info: Full Code Allergies Info: Latex, Augmentin (Amoxicillin-pot Clavulanate), Doxycycline, Hydrocodone, Lactose Intolerance (Gi),  Lialda (Mesalamine), Prinivil (Lisinopril), Triamcinolone Acetonide, Amlodipine, Lotensin (Benazepril Hcl) Psychotropic Info: busPIRone (BUSPAR) tablet 30 mg 2x daily PO; mirtazapine (REMERON) tablet 7.5 mg daily at bedtime PO         Current Medications (02/11/2020):  This is the current hospital active medication list Current Facility-Administered Medications  Medication Dose Route Frequency Provider Last Rate Last Admin  . acetaminophen (TYLENOL) tablet 650 mg  650 mg Oral Q6H PRN Pahwani, Rinka R, MD   650 mg at 02/10/20 0914   Or  . acetaminophen (TYLENOL) suppository 650 mg  650 mg Rectal Q6H PRN Pahwani, Rinka R, MD      . ALPRAZolam (XANAX) tablet 0.5 mg  0.5 mg Oral TID PRN Pahwani, Rinka R, MD   0.5 mg at 02/10/20 1620  . atorvastatin (LIPITOR) tablet 80 mg  80 mg Oral Daily Pahwani, Rinka R, MD   80 mg at 02/10/20 1727  . busPIRone (BUSPAR) tablet 30 mg  30 mg Oral BID Pahwani, Rinka R, MD   30 mg at 02/11/20 0812  . carvedilol (COREG) tablet 6.25 mg  6.25 mg Oral BID WC Edwin Dada, MD   6.25 mg at 02/11/20 0816  . ferrous sulfate tablet 325 mg  325 mg Oral Q breakfast Edwin Dada, MD   325 mg at 02/11/20 0811  . furosemide (LASIX) tablet 40 mg  40 mg Oral Daily Edwin Dada, MD   40 mg at 02/11/20 0811  . hydroxychloroquine (PLAQUENIL) tablet 200 mg  200 mg Oral Daily Pahwani, Rinka R, MD   200 mg at 02/11/20 0811  . ipratropium-albuterol (DUONEB) 0.5-2.5 (3) MG/3ML nebulizer solution 3 mL  3 mL Nebulization Q4H PRN Lang Snow, FNP   3 mL at 02/10/20 0009  . LORazepam (ATIVAN) injection 1 mg  1 mg Intravenous Once Lang Snow, FNP      . mirtazapine (REMERON) tablet 7.5 mg  7.5 mg Oral QHS Pahwani, Rinka R, MD   7.5 mg at 02/10/20 2048  . ondansetron (ZOFRAN) tablet 4 mg  4 mg Oral Q6H PRN Pahwani, Rinka R, MD       Or  . ondansetron (ZOFRAN) injection 4 mg  4 mg Intravenous Q6H PRN Pahwani, Rinka R, MD      . pantoprazole (PROTONIX) 80 mg  in sodium chloride 0.9 % 100 mL (0.8 mg/mL) infusion  8 mg/hr Intravenous Continuous Salley Slaughter, PA-C 10 mL/hr at 02/11/20 0046 8 mg/hr at 02/11/20 0046  . predniSONE (DELTASONE) tablet 5 mg  5 mg Oral Daily Pahwani, Rinka R, MD   5 mg at 02/11/20 0812  . sodium chloride flush (NS) 0.9 % injection 3 mL  3 mL Intravenous Once Milton Ferguson, MD      . spironolactone (ALDACTONE) tablet 12.5 mg  12.5 mg Oral Daily Danford, Suann Larry, MD   12.5 mg at 02/11/20 9937     Discharge Medications: Please see discharge summary for a list of discharge medications.  Relevant Imaging Results:  Relevant Lab Results:   Additional Information [id number] [place], [place]

## 2020-02-11 NOTE — Progress Notes (Signed)
PROGRESS NOTE    Kerstie Agent  BSJ:[id number] DOB: 05-02-1946 DOA: 02/09/2020 PCP: Gayland Curry, DO      Brief Narrative:  Mrs. Storti is a 74 y.o. F with hx RA on Plaquenil, A. Fib on Xarelto, sCHF EF 30-35%, depression, HTN, and COPD not on O2 who presented with 2 days black tarry stools.  Finally, had black diarrhea, was too weak to get up from commode, came to ER.  In the ER, Hgb 4.4 and FOBT+.  BP soft.  Given fluids, blood and the hospitalist service were consulted for GI bleed.          Assessment & Plan:  Acute GI bleed No further melena.    EGD today showed no gastric cause for bleeding.  Plan for capsule study tomorrow. -Resume Xarelto, aspirin -Continue PPI in oral form -Capsule study tomorrow -Consult GI for endoscopy, appreciate cares   Acute blood loss anemia Hgb 4.4 on admission. Repeat before transfusion up to 6.1 g/dL.  Given 1 1/2 units overnight and developed pulmonary edema.  Interestingly, Hgb came up to 11 g/dL with just 1 1/2 units PRBCs, I agree with GI, there appears to be some error in initial measurement, in retrospect.  Hgb stable today. -Trend Hgb -Hgb transfusion threshold 7 g/dL unless signs of ischemia, worsening renal function or worsening CHF  Acute on chronic systolic CHF Given 1 1/2 units PRBCs on admission and developed respiratory distress. CXR showed new infiltrates.  Started on Lasix and BiPAP and symptoms improved.  Today, weaned off O2  -Continue Lasix spironolactone -Continue aspirin -Resume carvedilol  -Not on ARB, unclear reason  Atrial fibrillation, chronic -Resume Xarelto -REsume carvedilol  Depression/Anxiety -Continue Xanax, BuSpar, mirtazapine  Hypertension BP normal -Resume carvedilol  Smoking COPD Cessation recommended.   -Continue nicotine patch  Rheumatoid arthritis -Continue Plaquenil, daily prednisone  Stage I pressure injury, sacrum, POA  Hyponatremia Mild, asymptomatic,  stable  AKI on CKD IIIa Baseline Cr 1.1, currently up to 1.5 due to fluid overload, now back to baseline         Disposition:  Status is: Inpatient  Remains inpatient appropriate because: The patient is elderly, frail, has con commitment CHF and rheumatoid arthritis as well as CKD stage IIIa, and requires ongoing evaluation in the hospital for GI bleeding with capsule endoscopy.   Dispo:  Patient From: Home  Planned Disposition: Home  Expected discharge date: 02/13/20  Medically stable for discharge: No                   MDM: The below labs and imaging reports reviewed and summarized above.  Medication management as above.      DVT prophylaxis: Back on Xarelto  Code Status: FULL Family Communication: None    Consultants:   GI  Procedures:  Endoscopy No blood in stomach, active bleeding, or source of recent melena/anemia/heme positivity seen on this exam. - Widely patent Schatzki ring. - 3 cm hiatal hernia. - Normal stomach. - Normal examined duodenum. - No specimens collected.   Antimicrobials:      Culture data:              Subjective: Patient shortness of breath is resolved.  She is still anxious because she does not know what is going on.  She has had no further melena, hematemesis, confusion.  Her respirations seem improved.   .  Objective: Vitals:   02/11/20 1345 02/11/20 1442 02/11/20 1453 02/11/20 1541  BP: (!) 175/88 (!) 140/51 Marland Kitchen)  164/60 (!) 176/94  Pulse: 72 66 67 72  Resp: 18 16 19 20   Temp: 98.7 F (37.1 C) 97.9 F (36.6 C)  97.9 F (36.6 C)  TempSrc: Oral Axillary  Oral  SpO2: 98% 98% 95% 100%  Weight: 47 kg     Height: 5\' 4"  (1.626 m)       Intake/Output Summary (Last 24 hours) at 02/11/2020 1647 Last data filed at 02/11/2020 1440 Gross per 24 hour  Intake 300 ml  Output 1025 ml  Net -725 ml   Filed Weights   02/11/20 0413 02/11/20 1345  Weight: 47.7 kg 47 kg    Examination: General  appearance: Elderly adult female, Lying in bed, no acute distress     HEENT: Conjunctival pink, lids and lashes normal.  No nasal deformity, discharge, or epistaxis.  Lips moist, dentures in place, no oral lesions, hearing normal. Skin: Normal color, no suspicious rashes or lesions Cardiac: RRR, no murmur, JVP normal, no lower extremity edema Respiratory: Respiratory effort normal, no wheezing or rales. Abdomen: Abdomen soft without tenderness palpation or guarding.  No ascites or distention. MSK:  Neuro: Awake and alert, extraocular movements intact, moves all extremities with generalized weakness, speech fluent Psych: Sensorium intact responding to questions, attention normal, affect normal, judgment insight appear normal      Data Reviewed: I have personally reviewed following labs and imaging studies:  CBC: Recent Labs  Lab 02/09/20 1317 02/09/20 1746 02/10/20 0220 02/11/20 0559  WBC 6.7 5.5 11.3* 6.3  HGB 4.4* 6.1* 11.3* 10.3*  HCT 14.2* 19.1* 33.0* 30.3*  MCV 121.4* 103.2* 92.4 94.1  PLT 174 149* 169 259   Basic Metabolic Panel: Recent Labs  Lab 02/09/20 1317 02/10/20 0220 02/11/20 0559  NA 129* 132* 133*  K 4.8 4.5 4.0  CL 101 107 106  CO2 20* 18* 18*  GLUCOSE 139* 115* 83  BUN 35* 32* 24*  CREATININE 1.58* 1.46* 1.26*  CALCIUM 8.2* 8.1* 7.6*   GFR: Estimated Creatinine Clearance: 29.1 mL/min (A) (by C-G formula based on SCr of 1.26 mg/dL (H)). Liver Function Tests: Recent Labs  Lab 02/10/20 0220  AST 30  ALT 36  ALKPHOS 31*  BILITOT 1.0  PROT 5.0*  ALBUMIN 2.8*   No results for input(s): LIPASE, AMYLASE in the last 168 hours. No results for input(s): AMMONIA in the last 168 hours. Coagulation Profile: No results for input(s): INR, PROTIME in the last 168 hours. Cardiac Enzymes: No results for input(s): CKTOTAL, CKMB, CKMBINDEX, TROPONINI in the last 168 hours. BNP (last 3 results) No results for input(s): PROBNP in the last 8760  hours. HbA1C: No results for input(s): HGBA1C in the last 72 hours. CBG: No results for input(s): GLUCAP in the last 168 hours. Lipid Profile: No results for input(s): CHOL, HDL, LDLCALC, TRIG, CHOLHDL, LDLDIRECT in the last 72 hours. Thyroid Function Tests: No results for input(s): TSH, T4TOTAL, FREET4, T3FREE, THYROIDAB in the last 72 hours. Anemia Panel: Recent Labs    02/09/20 1505  VITAMINB12 443  FOLATE 24.8  FERRITIN 33  TIBC 326  IRON 41  RETICCTPCT 13.5*   Urine analysis:    Component Value Date/Time   COLORURINE YELLOW 03/12/2019 1148   APPEARANCEUR CLEAR 03/12/2019 1148   LABSPEC 1.003 (L) 03/12/2019 1148   PHURINE 7.0 03/12/2019 1148   GLUCOSEU NEGATIVE 03/12/2019 1148   HGBUR NEGATIVE 03/12/2019 1148   BILIRUBINUR NEGATIVE 03/12/2019 1148   KETONESUR NEGATIVE 03/12/2019 1148   PROTEINUR 30 (A) 03/12/2019 1148  UROBILINOGEN 0.2 09/22/2012 1100   NITRITE POSITIVE (A) 03/12/2019 1148   LEUKOCYTESUR MODERATE (A) 03/12/2019 1148   Sepsis Labs: @LABRCNTIP (procalcitonin:4,lacticacidven:4)  ) Recent Results (from the past 240 hour(s))  SARS Coronavirus 2 by RT PCR (hospital order, performed in [facility] hospital lab) Nasopharyngeal Nasopharyngeal Swab     Status: None   Collection Time: 02/09/20  5:35 PM   Specimen: Nasopharyngeal Swab  Result Value Ref Range Status   SARS Coronavirus 2 NEGATIVE NEGATIVE Final    Comment: (NOTE) SARS-CoV-2 target nucleic acids are NOT DETECTED.  The SARS-CoV-2 RNA is generally detectable in upper and lower respiratory specimens during the acute phase of infection. The lowest concentration of SARS-CoV-2 viral copies this assay can detect is 250 copies / mL. A negative result does not preclude SARS-CoV-2 infection and should not be used as the sole basis for treatment or other patient management decisions.  A negative result may occur with improper specimen collection / handling, submission of specimen other than  nasopharyngeal swab, presence of viral mutation(s) within the areas targeted by this assay, and inadequate number of viral copies (<250 copies / mL). A negative result must be combined with clinical observations, patient history, and epidemiological information.  Fact Sheet for Patients:   StrictlyIdeas.no  Fact Sheet for Healthcare Providers: BankingDealers.co.za  This test is not yet approved or  cleared by the [place] FDA and has been authorized for detection and/or diagnosis of SARS-CoV-2 by FDA under an Emergency Use Authorization (EUA).  This EUA will remain in effect (meaning this test can be used) for the duration of the COVID-19 declaration under Section 564(b)(1) of the Act, 21 U.S.C. section 360bbb-3(b)(1), unless the authorization is terminated or revoked sooner.  Performed at [facility], [place] [address], [place], [place] [address]          Radiology Studies: DG Chest Port 1 View  Result Date: 02/10/2020 CLINICAL DATA:  Chest acute respiratory distress, on CPAP EXAM: PORTABLE CHEST 1 VIEW COMPARISON:  Radiograph 02/09/2020, 06/22/2012 FINDINGS: There has been rapid interval progression of the heterogeneous interstitial and airspace opacity throughout both lungs, right greater than left with some associated interlobular septal thickening and vascular congestion. Cardiomediastinal silhouette is similar to prior accounting for differences in inflation in technique. The aorta is calcified. The remaining cardiomediastinal contours are unremarkable. Telemetry leads overlie the chest. Gaseous distention of the stomach, nonspecific. No other acute osseous or soft tissue abnormality. Degenerative changes are present in the imaged spine and shoulders. Vascular calcium in the left carotid and right axillary region. IMPRESSION: Rapid interval progression of the heterogeneous interstitial and airspace opacity throughout both  lungs, right greater than left, with associated vascular congestion and interlobular septal thickening. Findings could reflect rapidly worsening and slightly asymmetric pulmonary edema or ARDS. Infection is less favored though should be excluded clinically. Electronically Signed   By: Lovena Le M.D.   On: 02/10/2020 00:55        Scheduled Meds: . atorvastatin  80 mg Oral Daily  . busPIRone  30 mg Oral BID  . carvedilol  6.25 mg Oral BID WC  . ferrous sulfate  325 mg Oral Q breakfast  . furosemide  40 mg Oral Daily  . hydroxychloroquine  200 mg Oral Daily  . LORazepam  1 mg Intravenous Once  . mirtazapine  7.5 mg Oral QHS  . predniSONE  5 mg Oral Daily  . sodium chloride flush  3 mL Intravenous Once  . spironolactone  12.5  mg Oral Daily   Continuous Infusions: . pantoprozole (PROTONIX) infusion 8 mg/hr (02/11/20 1100)     LOS: 2 days    Time spent: 25 mnutes    Edwin Dada, MD [facility] 02/11/2020, 4:47 PM     Please page though [place] or Epic secure chat:  For [employer], Adult nurse

## 2020-02-11 NOTE — Progress Notes (Signed)
Patient's upper endoscopy was well-tolerated.  The current exam, unlike the one back in December, did not show any erosive gastropathy or other source of melena or heme positivity or anemia.  Recommendations:  1.  Okay to resume anticoagulation at this time  2.  Given that the patient has had recent colonoscopy which was unrevealing for a source of blood loss, and that the current endoscopy is negative as well, and since the patient had significant anemia and has an ongoing need for anticoagulation, I feel we should complete her GI work-up by doing a capsule endoscopy study.  This will be performed tomorrow (ideally, while on anticoagulation to "unmask" any bleeding from vascular ectasia) and hopefully the results will be available by Monday afternoon or Tuesday, although it would not be essential to keep the patient in the hospital all that time if her hemoglobin is remaining stable.  Will discuss with Dr. Loleta Books.  Cleotis Nipper, M.D. Pager [phone] If no answer or after 5 PM call [phone]

## 2020-02-11 NOTE — TOC Initial Note (Signed)
Transition of Care [facility]) - Initial/Assessment Note    Patient Details  Name: Ann-Marie Kluge MRN: [id number] Date of Birth: 03/03/1946  Transition of Care [facility]) CM/SW Contact:    Trula Ore, [place] Phone Number: 02/11/2020, 1:42 PM  Clinical Narrative:                  CSW spoke with patient at bedside. Patient declined SNF placement. Patient said she has [place] through [facility]. She said she has two caregivers during the day. She has no caregiver at night. CSW reached out to case manger to let her know that patient declined SNF placement.   TOC team will continue to follow  Expected Discharge Plan: [place] Barriers to Discharge: Continued Medical Work up   Patient Goals and CMS Choice Patient states their goals for this hospitalization and ongoing recovery are:: to go home with home health services CMS Medicare.gov Compare Post Acute Care list provided to:: Patient Choice offered to / list presented to : Patient  Expected Discharge Plan and Services Expected Discharge Plan: [place]       Living arrangements for the past 2 months: Single Family Home                                      Prior Living Arrangements/Services Living arrangements for the past 2 months: Single Family Home Lives with:: Self Patient language and need for interpreter reviewed:: Yes Do you feel safe going back to the place where you live?: Yes      Need for Family Participation in Patient Care: Yes (Comment) Care giver support system in place?: Yes (comment)   Criminal Activity/Legal Involvement Pertinent to Current Situation/Hospitalization: No - Comment as needed  Activities of Daily Living      Permission Sought/Granted Permission sought to share information with : Case Manager, Family Supports, Customer service manager                Emotional Assessment Appearance:: Appears stated  age Attitude/Demeanor/Rapport: Gracious Affect (typically observed): Calm Orientation: : Oriented to Self, Oriented to Place, Oriented to  Time, Oriented to Situation (WDL) Alcohol / Substance Use: Not Applicable Psych Involvement: No (comment)  Admission diagnosis:  GI bleeding [K92.2] Symptomatic anemia [D64.9] Gastrointestinal hemorrhage, unspecified gastrointestinal hemorrhage type [K92.2] Patient Active Problem List   Diagnosis Date Noted  . Macrocytosis 02/09/2020  . GI bleeding 02/09/2020  . Acute on chronic combined systolic and diastolic CHF (congestive heart failure) ([place]) 01/03/2020  . Acute pulmonary edema (HCC)   . Acute respiratory failure with hypoxia ([place]) 12/31/2019  . COPD (chronic obstructive pulmonary disease) ([place]) 12/31/2019  . Elevated troponin 12/31/2019  . Osteopenia 11/01/2019  . Protein-calorie malnutrition, severe 08/19/2019  . Urinary frequency 04/15/2019  . Senile osteoporosis   . Hyperlipidemia   . Adrenal insufficiency ([place]) 04/13/2019  . Uncontrolled hypertension 04/13/2019  . Bilateral lower extremity edema 04/13/2019  . UTI (urinary tract infection) 04/02/2019  . Pain on right lower extremity 03/31/2019  . Acute blood loss anemia 03/21/2019  . Pressure injury of skin 03/15/2019  . Fracture of femoral neck, right, closed ([place]) 03/14/2019  . Fall at home, initial encounter 03/12/2019  . Urinary retention 02/15/2019  . Lumbosacral radiculopathy 02/15/2019  . Constipation 02/15/2019  . Acute urinary retention 02/15/2019  . Cauda equina syndrome ([place]) 01/28/2019  . Chest pain 09/25/2017  .  Hypertensive urgency 09/24/2017  . Headache 09/24/2017  . Alcohol use 09/24/2017  . AKI (acute kidney injury) ([place]) 09/24/2017  . Bilateral carotid artery stenosis   . Vulvovaginal candidiasis 10/22/2015  . Mild malnutrition ([place]) 07/07/2013  . Chronic bronchitis ([place]) 07/07/2013  . Tobacco abuse 07/07/2013  . Hypertension   . Short of breath on  exertion   . Hemorrhoid   . LBBB (left bundle branch block)   . Anxiety   . Major depressive disorder   . Rheumatoid arthritis ([place])   . Venous insufficiency of leg   . Chronic hyponatremia   . AIN grade I 04/05/2013  . Diarrhea 09/22/2012  . Hyponatremia 09/22/2012  . Hypokalemia 09/22/2012  . Acute on chronic kidney failure ([place]) 09/22/2012  . Atypical chest pain 06/22/2012  . Symptomatic anemia 06/22/2012  . Vulvar intraepithelial neoplasia III (VIN III) 01/29/2012   PCP:  Gayland Curry, DO Pharmacy:   [place], [place] - [address] [place] [place] [address] Phone: [phone] Fax: [place], [place] [address] [place] [address] Phone: [phone] Fax: [place], [place] [address] Phone: [phone] Fax: [phone]     Social Determinants of Health (SDOH) Interventions    Readmission Risk Interventions Readmission Risk Prevention Plan 01/06/2020 02/22/2019 02/16/2019  Transportation Screening Complete Complete Complete  PCP or Specialist Appt within 3-5 Days - - Complete  HRI or [place] - - Complete  Social Work Consult for [place] Planning/Counseling - - Complete  Palliative Care Screening - - Not Applicable  Medication Review Press photographer) Complete Complete Complete  PCP or Specialist appointment within 3-5 days of discharge Complete Complete -  [place] or Home Care Consult Complete Complete -  SW Recovery Care/Counseling Consult Complete Complete -  Palliative Care Screening Complete Not Applicable -  [place] Not Applicable Not Applicable -  Some recent data might be hidden

## 2020-02-11 NOTE — Op Note (Signed)
[facility] Patient Name: Regina Ortiz Procedure Date : 02/11/2020 MRN: [id number] Attending MD: Ronald Lobo , MD Date of Birth: 09-14-1945 CSN: [id number] Age: 74 Admit Type: Inpatient Procedure:                Upper GI endoscopy Indications:              Acute post hemorrhagic anemia, Heme positive stool,                            Melena in a patient on aspirin and Xarelto. Providers:                Ronald Lobo, MD, Jeanella Cara, RN,                            Elspeth Cho Tech., Technician, Clearnce Sorrel, CRNA Referring MD:              Medicines:                Monitored Anesthesia Care Complications:            No immediate complications. Estimated Blood Loss:     Estimated blood loss: none. Procedure:                Pre-Anesthesia Assessment:                           - Prior to the procedure, a History and Physical                            was performed, and patient medications and                            allergies were reviewed. The patient's tolerance of                            previous anesthesia was also reviewed. The risks                            and benefits of the procedure and the sedation                            options and risks were discussed with the patient.                            All questions were answered, and informed consent                            was obtained. Prior Anticoagulants: The patient has                            taken Xarelto (rivaroxaban), last dose was 4 days                            prior to procedure. ASA Grade Assessment: III - A  patient with severe systemic disease. After                            reviewing the risks and benefits, the patient was                            deemed in satisfactory condition to undergo the                            procedure.                           After obtaining informed consent, the endoscope was                             passed under direct vision. Throughout the                            procedure, the patient's blood pressure, pulse, and                            oxygen saturations were monitored continuously. The                            GIF-H190 ([id number]) Olympus gastroscope was                            introduced through the mouth, and advanced to the                            second part of duodenum. The upper GI endoscopy was                            accomplished without difficulty. The patient                            tolerated the procedure well. Scope In: Scope Out: Findings:      A widely patent Schatzki ring was found at the gastroesophageal junction.      The exam of the esophagus was otherwise normal.      A 3 cm hiatal hernia was present.      The entire examined stomach was normal. No blood or coffee grounds were       present.      There is no endoscopic evidence of erythema or inflammatory changes       suggestive of gastritis, ulceration or erosion in the entire examined       stomach.      The cardia and gastric fundus were normal on retroflexion.      The examined duodenum was normal. The duodenal bulb was carefully       inspected by 4-quadrant pull-through.      The stomach and duodenum were re-examined with no additional findings. Impression:               - No blood in stomach, active bleeding, or source  of recent melena/anemia/heme positivity seen on                            this exam.                           - Widely patent Schatzki ring.                           - 3 cm hiatal hernia.                           - Normal stomach.                           - Normal examined duodenum.                           - No specimens collected. Recommendation:           - To visualize the small bowel, perform video                            capsule endoscopy tomorrow. Procedure Code(s):        --- Professional ---                            [id number], Esophagogastroduodenoscopy, flexible,                            transoral; diagnostic, including collection of                            specimen(s) by brushing or washing, when performed                            (separate procedure) Diagnosis Code(s):        --- Professional ---                           K22.2, Esophageal obstruction                           D62, Acute posthemorrhagic anemia                           R19.5, Other fecal abnormalities                           K92.1, Melena (includes Hematochezia) CPT copyright 2019 American Medical Association. All rights reserved. The codes documented in this report are preliminary and upon coder review may  be revised to meet current compliance requirements. Ronald Lobo, MD 02/11/2020 3:02:55 PM This report has been signed electronically. Number of Addenda: 0

## 2020-02-11 NOTE — Anesthesia Preprocedure Evaluation (Signed)
Anesthesia Evaluation  Patient identified by MRN, date of birth, ID band Patient awake    Reviewed: Allergy & Precautions, NPO status , Patient's Chart, lab work & pertinent test results  Airway Mallampati: II  TM Distance: >3 FB Neck ROM: Limited    Dental  (+) Edentulous Upper   Pulmonary Current Smoker,    breath sounds clear to auscultation       Cardiovascular hypertension,  Rhythm:Irregular     Neuro/Psych    GI/Hepatic   Endo/Other    Renal/GU      Musculoskeletal   Abdominal   Peds  Hematology   Anesthesia Other Findings   Reproductive/Obstetrics                             Anesthesia Physical Anesthesia Plan  ASA: III  Anesthesia Plan: MAC   Post-op Pain Management:    Induction: Intravenous  PONV Risk Score and Plan: Propofol infusion  Airway Management Planned: Natural Airway and Nasal Cannula  Additional Equipment:   Intra-op Plan:   Post-operative Plan:   Informed Consent: I have reviewed the patients History and Physical, chart, labs and discussed the procedure including the risks, benefits and alternatives for the proposed anesthesia with the patient or authorized representative who has indicated his/her understanding and acceptance.       Plan Discussed with: CRNA and Anesthesiologist  Anesthesia Plan Comments:         Anesthesia Quick Evaluation

## 2020-02-12 ENCOUNTER — Encounter (HOSPITAL_COMMUNITY): Payer: Self-pay | Admitting: Gastroenterology

## 2020-02-12 ENCOUNTER — Encounter (HOSPITAL_COMMUNITY)
Admission: EM | Disposition: A | Discharge status: Home Health | Payer: Self-pay | Source: Home / Self Care | Attending: Family Medicine

## 2020-02-12 HISTORY — PX: GIVENS CAPSULE STUDY: SHX5432

## 2020-02-12 LAB — COMPREHENSIVE METABOLIC PANEL
ALT: 27 U/L (ref 0–44)
AST: 23 U/L (ref 15–41)
Albumin: 2.3 g/dL — ABNORMAL LOW (ref 3.5–5.0)
Alkaline Phosphatase: 28 U/L — ABNORMAL LOW (ref 38–126)
Anion gap: 9 (ref 5–15)
BUN: 19 mg/dL (ref 8–23)
CO2: 21 mmol/L — ABNORMAL LOW (ref 22–32)
Calcium: 7.6 mg/dL — ABNORMAL LOW (ref 8.9–10.3)
Chloride: 103 mmol/L (ref 98–111)
Creatinine, Ser: 1.28 mg/dL — ABNORMAL HIGH (ref 0.44–1.00)
GFR calc Af Amer: 48 mL/min — ABNORMAL LOW (ref >60)
GFR calc non Af Amer: 41 mL/min — ABNORMAL LOW (ref >60)
Glucose, Bld: 89 mg/dL (ref 70–99)
Potassium: 3.3 mmol/L — ABNORMAL LOW (ref 3.5–5.1)
Sodium: 133 mmol/L — ABNORMAL LOW (ref 135–145)
Total Bilirubin: 1 mg/dL (ref 0.3–1.2)
Total Protein: 4.4 g/dL — ABNORMAL LOW (ref 6.5–8.1)

## 2020-02-12 LAB — CBC
HCT: 32.8 % — ABNORMAL LOW (ref 36.0–46.0)
Hemoglobin: 10.9 g/dL — ABNORMAL LOW (ref 12.0–15.0)
MCH: 31.7 pg (ref 26.0–34.0)
MCHC: 33.2 g/dL (ref 30.0–36.0)
MCV: 95.3 fL (ref 80.0–100.0)
Platelets: 158 10*3/uL (ref 150–400)
RBC: 3.44 MIL/uL — ABNORMAL LOW (ref 3.87–5.11)
RDW: 20.8 % — ABNORMAL HIGH (ref 11.5–15.5)
WBC: 6.1 10*3/uL (ref 4.0–10.5)
nRBC: 0 % (ref 0.0–0.2)

## 2020-02-12 LAB — URINALYSIS, ROUTINE W REFLEX MICROSCOPIC
Bacteria, UA: NONE SEEN
Bilirubin Urine: NEGATIVE
Glucose, UA: NEGATIVE mg/dL
Ketones, ur: NEGATIVE mg/dL
Nitrite: NEGATIVE
Protein, ur: NEGATIVE mg/dL
Specific Gravity, Urine: 1.003 — ABNORMAL LOW (ref 1.005–1.030)
pH: 6 (ref 5.0–8.0)

## 2020-02-12 SURGERY — IMAGING PROCEDURE, GI TRACT, INTRALUMINAL, VIA CAPSULE
Anesthesia: LOCAL

## 2020-02-12 MED ORDER — PANTOPRAZOLE SODIUM 40 MG PO TBEC
40.0000 mg | DELAYED_RELEASE_TABLET | Freq: Two times a day (BID) | ORAL | Status: DC
  Administered 2020-02-12 – 2020-02-13 (×3): 40 mg via ORAL
  Filled 2020-02-12 (×3): qty 1

## 2020-02-12 MED ORDER — POLYETHYLENE GLYCOL 3350 17 G PO PACK
17.0000 g | PACK | Freq: Once | ORAL | Status: AC
  Administered 2020-02-12: 17 g via ORAL
  Filled 2020-02-12: qty 1

## 2020-02-12 MED ORDER — POTASSIUM CHLORIDE CRYS ER 20 MEQ PO TBCR
40.0000 meq | EXTENDED_RELEASE_TABLET | Freq: Once | ORAL | Status: AC
  Administered 2020-02-12: 40 meq via ORAL
  Filled 2020-02-12: qty 2

## 2020-02-12 MED ORDER — SODIUM CHLORIDE 0.9 % IV SOLN: INTRAVENOUS | Status: DC

## 2020-02-12 MED ORDER — RIVAROXABAN 15 MG PO TABS: 15.0000 mg | ORAL_TABLET | Freq: Every day | ORAL | Status: DC

## 2020-02-12 MED ORDER — BISACODYL 10 MG RE SUPP
10.0000 mg | Freq: Once | RECTAL | Status: AC
  Administered 2020-02-12: 10 mg via RECTAL
  Filled 2020-02-12: qty 1

## 2020-02-12 MED ORDER — HYDRALAZINE HCL 25 MG PO TABS
25.0000 mg | ORAL_TABLET | Freq: Three times a day (TID) | ORAL | Status: DC
  Administered 2020-02-12: 25 mg via ORAL
  Filled 2020-02-12: qty 1

## 2020-02-12 MED ORDER — CARVEDILOL 25 MG PO TABS
25.0000 mg | ORAL_TABLET | Freq: Two times a day (BID) | ORAL | Status: DC
  Administered 2020-02-12 – 2020-02-13 (×2): 25 mg via ORAL
  Filled 2020-02-12 (×2): qty 1

## 2020-02-12 MED ORDER — CARVEDILOL 12.5 MG PO TABS: 12.5000 mg | ORAL_TABLET | Freq: Two times a day (BID) | ORAL | Status: DC

## 2020-02-12 SURGICAL SUPPLY — 1 items: TOWEL COTTON PACK 4EA (MISCELLANEOUS) ×4 IMPLANT

## 2020-02-12 NOTE — Progress Notes (Signed)
Pt ingested pill cam at 0945. Tolerated well. Instructions reviewed with pt and bedside RN.

## 2020-02-12 NOTE — Progress Notes (Signed)
In and Out cath done to obtain U/A.  Urine tube to the lab

## 2020-02-12 NOTE — Progress Notes (Addendum)
PROGRESS NOTE    Kimyata Milich  KGU:[id number] DOB: 08-25-1946 DOA: 02/09/2020 PCP: Gayland Curry, DO      Brief Narrative:  Mrs. Mahmood is a 74 y.o. F with hx RA on Plaquenil, A. Fib on Xarelto, sCHF EF 30-35%, depression, HTN, and COPD not on O2 who presented with 2 days black tarry stools.  Finally, had black diarrhea, was too weak to get up from commode, came to ER.  In the ER, Hgb 4.4 and FOBT+.  BP soft.  Given fluids, blood and the hospitalist service were consulted for GI bleed.          Assessment & Plan:  Acute GI bleed Admitted and started on PPI drip.  Transfused blood and monitored.  Clinical bleeding stopped.  EGD performed on 6/12 showed no gastric/duodenal source.  GI plan for capsule study now.    -Continue Xarelto, aspirin -Continue PPI -Consult GI, appreciate cares   Acute blood loss anemia Hgb 4.4 on admission. Repeat before transfusion up to 6.1 g/dL.  Given 1 1/2 units overnight and developed pulmonary edema.  Interestingly, Hgb came up to 11 g/dL with just 1 1/2 units PRBCs, I agree with GI, there appears to be some error in initial measurement, in retrospect.  Hgb stable today.  Hgb stable, no further bleeding.    Dysuria -Check urine culture  Acute on chronic systolic CHF Given 1 1/2 units PRBCs on admission and developed respiratory distress. CXR showed new infiltrates.  Started on Lasix and BiPAP and symptoms improved.  Stable off O2 -Continue Lasix spironolactone, carvedilol -Continue aspirin -Not on ARB, unclear reason  Atrial fibrillation, chronic Rate controlled -Continue Xarelto, carvedilol   Depression/Anxiety -Continue Xanax, BuSpar, mirtazapine  Hypertension BP elevated -Continue carvedilol, increase dose  Smoking COPD Cessation recommended.   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 Maurice Small in the past.  I defer this to Dr. Trudie Reed, and recommend patient discuss with Dr. Trudie Reed before restarting hydralazine.        Disposition:  Status is: Inpatient  Remains inpatient appropriate because:Of ongoing inpatient work-up with capsule endoscopy today to be finished late tonight, likely discharge tomorrow.   Dispo:  Patient From: Home  Planned Disposition: Home  Expected discharge date: 02/13/20  Medically stable for discharge: No                    MDM: The below labs and imaging reports reviewed and summarized above.  Medication management as above.       DVT prophylaxis: Back on Xarelto  Code Status: FULL Family Communication: None    Consultants:   GI  Procedures:  Endoscopy No blood in stomach, active bleeding, or source of recent melena/anemia/heme positivity seen on this exam. - Widely patent Schatzki ring. - 3 cm hiatal hernia. - Normal stomach. - Normal examined duodenum. - No specimens collected.   Antimicrobials:      Culture data:              Subjective: Patient is anxious, she is constipated.  She has had no melena, hematemesis, confusion.  Respirations normal.       .  Objective: Vitals:   02/11/20  1657 02/11/20 2205 02/12/20 0300 02/12/20 0812  BP: (!) 181/93 (!) 146/81  (!) 161/94  Pulse: 76 64  78  Resp: 13 16  18   Temp: 97.6 F (36.4 C) 97.6 F (36.4 C)  98.7 F (37.1 C)  TempSrc: Oral Oral    SpO2: 98% 100%  100%  Weight:   45.6 kg   Height:        Intake/Output Summary (Last 24 hours) at 02/12/2020 1228 Last data filed at 02/11/2020 1440 Gross per 24 hour  Intake 100 ml  Output 725 ml  Net -625 ml   Filed Weights   02/11/20 0413  02/11/20 1345 02/12/20 0300  Weight: 47.7 kg 47 kg 45.6 kg    Examination: General appearance: Thin elderly female, lying in bed, no history distress, interactive     HEENT: Conjunctival pink, lids and lashes normal.  No nasal deformity, discharge, epistaxis lips normal, dentures in place, no oral lesions, hearing normal Skin: Mild pallor, no suspicious rashes or lesions Cardiac: RRR, no murmurs, trace pitting lower extremity edema Respiratory: Respiratory effort normal, no wheezing rales Abdomen: Abdomen soft, no tenderness to palpation or guarding, no ascites or distention MSK:  Neuro: Alert, extraocular movements intact, moves all extremities with generalized weakness, speech fluent Psych: Sensorium intact responding to questions, attention normal, affect normal, judgment insight appear normal       Data Reviewed: I have personally reviewed following labs and imaging studies:  CBC: Recent Labs  Lab 02/09/20 1317 02/09/20 1746 02/10/20 0220 02/11/20 0559 02/12/20 0413  WBC 6.7 5.5 11.3* 6.3 6.1  HGB 4.4* 6.1* 11.3* 10.3* 10.9*  HCT 14.2* 19.1* 33.0* 30.3* 32.8*  MCV 121.4* 103.2* 92.4 94.1 95.3  PLT 174 149* 169 158 867   Basic Metabolic Panel: Recent Labs  Lab 02/09/20 1317 02/10/20 0220 02/11/20 0559 02/12/20 0413  NA 129* 132* 133* 133*  K 4.8 4.5 4.0 3.3*  CL 101 107 106 103  CO2 20* 18* 18* 21*  GLUCOSE 139* 115* 83 89  BUN 35* 32* 24* 19  CREATININE 1.58* 1.46* 1.26* 1.28*  CALCIUM 8.2* 8.1* 7.6* 7.6*   GFR: Estimated Creatinine Clearance: 27.8 mL/min (A) (by C-G formula based on SCr of 1.28 mg/dL (H)). Liver Function Tests: Recent Labs  Lab 02/10/20 0220 02/12/20 0413  AST 30 23  ALT 36 27  ALKPHOS 31* 28*  BILITOT 1.0 1.0  PROT 5.0* 4.4*  ALBUMIN 2.8* 2.3*   No results for input(s): LIPASE, AMYLASE in the last 168 hours. No results for input(s): AMMONIA in the last 168 hours. Coagulation Profile: No results for input(s): INR, PROTIME in  the last 168 hours. Cardiac Enzymes: No results for input(s): CKTOTAL, CKMB, CKMBINDEX, TROPONINI in the last 168 hours. BNP (last 3 results) No results for input(s): PROBNP in the last 8760 hours. HbA1C: No results for input(s): HGBA1C in the last 72 hours. CBG: No results for input(s): GLUCAP in the last 168 hours. Lipid Profile: No results for input(s): CHOL, HDL, LDLCALC, TRIG, CHOLHDL, LDLDIRECT in the last 72 hours. Thyroid Function Tests: No results for input(s): TSH, T4TOTAL, FREET4, T3FREE, THYROIDAB in the last 72 hours. Anemia Panel: Recent Labs    02/09/20 1505  VITAMINB12 443  FOLATE 24.8  FERRITIN 33  TIBC 326  IRON 41  RETICCTPCT 13.5*   Urine analysis:    Component Value Date/Time   COLORURINE YELLOW 03/12/2019 1148   APPEARANCEUR CLEAR 03/12/2019 1148   LABSPEC 1.003 (L) 03/12/2019 1148   PHURINE 7.0  03/12/2019 [place] 03/12/2019 1148   [place] 03/12/2019 [place] 03/12/2019 1148   KETONESUR NEGATIVE 03/12/2019 1148   PROTEINUR 30 (A) 03/12/2019 1148   UROBILINOGEN 0.2 09/22/2012 1100   NITRITE POSITIVE (A) 03/12/2019 1148   LEUKOCYTESUR MODERATE (A) 03/12/2019 1148   Sepsis Labs: @LABRCNTIP (procalcitonin:4,lacticacidven:4)  ) Recent Results (from the past 240 hour(s))  SARS Coronavirus 2 by RT PCR (hospital order, performed in [facility] hospital lab) Nasopharyngeal Nasopharyngeal Swab     Status: None   Collection Time: 02/09/20  5:35 PM   Specimen: Nasopharyngeal Swab  Result Value Ref Range Status   SARS Coronavirus 2 NEGATIVE NEGATIVE Final    Comment: (NOTE) SARS-CoV-2 target nucleic acids are NOT DETECTED.  The SARS-CoV-2 RNA is generally detectable in upper and lower respiratory specimens during the acute phase of infection. The lowest concentration of SARS-CoV-2 viral copies this assay can detect is 250 copies / mL. A negative result does not preclude SARS-CoV-2 infection and should not be  used as the sole basis for treatment or other patient management decisions.  A negative result may occur with improper specimen collection / handling, submission of specimen other than nasopharyngeal swab, presence of viral mutation(s) within the areas targeted by this assay, and inadequate number of viral copies (<250 copies / mL). A negative result must be combined with clinical observations, patient history, and epidemiological information.  Fact Sheet for Patients:   StrictlyIdeas.no  Fact Sheet for Healthcare Providers: BankingDealers.co.za  This test is not yet approved or  cleared by the [place] FDA and has been authorized for detection and/or diagnosis of SARS-CoV-2 by FDA under an Emergency Use Authorization (EUA).  This EUA will remain in effect (meaning this test can be used) for the duration of the COVID-19 declaration under Section 564(b)(1) of the Act, 21 U.S.C. section 360bbb-3(b)(1), unless the authorization is terminated or revoked sooner.  Performed at [facility], [place] [address], [place], [place] [address]          Radiology Studies: No results found.      Scheduled Meds: . aspirin EC  81 mg Oral Daily  . atorvastatin  80 mg Oral Daily  . busPIRone  30 mg Oral BID  . carvedilol  25 mg Oral BID WC  . ferrous sulfate  325 mg Oral Q breakfast  . furosemide  40 mg Oral Daily  . hydroxychloroquine  200 mg Oral Daily  . LORazepam  1 mg Intravenous Once  . mirtazapine  7.5 mg Oral QHS  . pantoprazole  40 mg Oral BID AC  . polyethylene glycol  17 g Oral Once  . predniSONE  5 mg Oral Daily  . rivaroxaban  15 mg Oral Q supper  . sodium chloride flush  3 mL Intravenous Once  . spironolactone  12.5 mg Oral Daily   Continuous Infusions:    LOS: 3 days    Time spent: 25 mnutes    Edwin Dada, MD [facility] 02/12/2020, 12:28 PM     Please page though [place] or Epic  secure chat:  For [employer], Adult nurse

## 2020-02-12 NOTE — Progress Notes (Signed)
Problem:  Melenic stool, recently restarted on Xarelto, hgb 4.4 (?spurious) (MCV 121) on admission, developed dyspnea/pulm edema following Tx, received total of 2 units of prc's.   Hemoglobin stable at 10.9.  BUN dropping progressively, currently 19. Prev egd 08/2019 erosive gastropathy, colonoscopy 08/2019 small polyps and tics.  Current egd neg.exc for 3 cm hiatal hernia.  Feels a little stronger today than yesterday.  Finally has had 2 BM's after suppository and Miralax (1st time since Wednesday), but fortunately, has not had "overshoot" diarr.  Now on regular diet.  Capsule study in progress, back on Xarelto.  No acute c/o's.  Plan: await results of capsule endoscopy.  Regina Ortiz, M.D. Pager [phone] If no answer or after 5 PM call [phone]

## 2020-02-12 NOTE — Anesthesia Postprocedure Evaluation (Signed)
Anesthesia Post Note  Patient: Regina Ortiz  Procedure(s) Performed: ESOPHAGOGASTRODUODENOSCOPY (EGD) WITH PROPOFOL (N/A )     Patient location during evaluation: PACU Anesthesia Type: MAC Level of consciousness: awake and alert Pain management: pain level controlled Vital Signs Assessment: post-procedure vital signs reviewed and stable Respiratory status: spontaneous breathing, nonlabored ventilation, respiratory function stable and patient connected to nasal cannula oxygen Cardiovascular status: stable and blood pressure returned to baseline Postop Assessment: no apparent nausea or vomiting Anesthetic complications: no   No complications documented.  Last Vitals:  Vitals:   02/11/20 1657 02/11/20 2205  BP: (!) 181/93 (!) 146/81  Pulse: 76 64  Resp: 13 16  Temp: 36.4 C 36.4 C  SpO2: 98% 100%    Last Pain:  Vitals:   02/11/20 2205  TempSrc: Oral  PainSc:                  Benigna Delisi COKER

## 2020-02-13 ENCOUNTER — Telehealth: Payer: Self-pay | Admitting: *Deleted

## 2020-02-13 LAB — COMPREHENSIVE METABOLIC PANEL
ALT: 27 U/L (ref 0–44)
AST: 24 U/L (ref 15–41)
Albumin: 2.3 g/dL — ABNORMAL LOW (ref 3.5–5.0)
Alkaline Phosphatase: 29 U/L — ABNORMAL LOW (ref 38–126)
Anion gap: 6 (ref 5–15)
BUN: 15 mg/dL (ref 8–23)
CO2: 22 mmol/L (ref 22–32)
Calcium: 7.3 mg/dL — ABNORMAL LOW (ref 8.9–10.3)
Chloride: 101 mmol/L (ref 98–111)
Creatinine, Ser: 1.36 mg/dL — ABNORMAL HIGH (ref 0.44–1.00)
GFR calc Af Amer: 44 mL/min — ABNORMAL LOW (ref >60)
GFR calc non Af Amer: 38 mL/min — ABNORMAL LOW (ref >60)
Glucose, Bld: 106 mg/dL — ABNORMAL HIGH (ref 70–99)
Potassium: 4.1 mmol/L (ref 3.5–5.1)
Sodium: 129 mmol/L — ABNORMAL LOW (ref 135–145)
Total Bilirubin: 0.9 mg/dL (ref 0.3–1.2)
Total Protein: 4.3 g/dL — ABNORMAL LOW (ref 6.5–8.1)

## 2020-02-13 LAB — CBC
HCT: 31.8 % — ABNORMAL LOW (ref 36.0–46.0)
Hemoglobin: 10.4 g/dL — ABNORMAL LOW (ref 12.0–15.0)
MCH: 31.4 pg (ref 26.0–34.0)
MCHC: 32.7 g/dL (ref 30.0–36.0)
MCV: 96.1 fL (ref 80.0–100.0)
Platelets: 149 10*3/uL — ABNORMAL LOW (ref 150–400)
RBC: 3.31 MIL/uL — ABNORMAL LOW (ref 3.87–5.11)
RDW: 20.4 % — ABNORMAL HIGH (ref 11.5–15.5)
WBC: 7.4 10*3/uL (ref 4.0–10.5)
nRBC: 0 % (ref 0.0–0.2)

## 2020-02-13 MED ORDER — CARVEDILOL 25 MG PO TABS: 25.0000 mg | ORAL_TABLET | Freq: Two times a day (BID) | ORAL | 3 refills | Status: DC

## 2020-02-13 MED ORDER — LOPERAMIDE HCL 2 MG PO CAPS
2.0000 mg | ORAL_CAPSULE | Freq: Once | ORAL | Status: AC
  Administered 2020-02-13: 2 mg via ORAL
  Filled 2020-02-13: qty 1

## 2020-02-13 NOTE — Telephone Encounter (Signed)
Transition Care Management Follow-up Telephone Call  Date of discharge and from where: 02/13/2020 [place]  How have you been since you were released from the hospital? Better per Caregiver  Any questions or concerns? No   Items Reviewed:  Did the pt receive and understand the discharge instructions provided? Yes   Medications obtained and verified? Yes   Any new allergies since your discharge? No   Dietary orders reviewed? Yes  Do you have support at home? Yes   Other (ie: DME, Home Health, etc) Home Health  Functional Questionnaire: (I = Independent and D = Dependent) ADL's: I with assistance   Bathing/Dressing- I with assistance   Meal Prep- D  Eating- I  Maintaining continence- I  Transferring/Ambulation- I with assistance Walker  Managing Meds- D   Follow up appointments reviewed:    PCP Hospital f/u appt confirmed? Yes  Scheduled to see Janett Billow on 02/22/20 .  [facility] f/u appt confirmed? No    Are transportation arrangements needed? No   If their condition worsens, is the pt aware to call  their PCP or go to the ED? Yes  Was the patient provided with contact information for the PCP's office or ED? Yes  Was the pt encouraged to call back with questions or concerns? Yes

## 2020-02-13 NOTE — Progress Notes (Addendum)
Nutrition Follow-up  DOCUMENTATION CODES:   Underweight  INTERVENTION:  Recommend patient consume oral nutrition supplements at discharge. Consider Ensure Enlive po BID, each supplement provides 350 kcal and 20 grams of protein. Recommendations provided in discharge instructions.   NUTRITION DIAGNOSIS:   Inadequate oral intake related to inability to eat as evidenced by NPO status.  Progressing, pt now on Heart Healthy diet.   GOAL:   Patient will meet greater than or equal to 90% of their needs  Progressing.   MONITOR:   PO intake, Diet advancement, Labs, Weight trends, Skin, I & O's  REASON FOR ASSESSMENT:   Malnutrition Screening Tool    ASSESSMENT:   Regina Ortiz is a 74 y.o. female with medical history significant of A. fib on Xarelto, COPD, anxiety, CHF with ejection fraction of 30 to 35%, GERD, depression presented with midsternal chest pain, shortness of breath and black tarry stool since 2 days.  Pt admitted with acute GIB, symptomatic anemia, and acute blood loss anemia.  6/12 s/p EGD 6/13 capsule endoscopy  Nurse tech in room at time of visit, pt to discharge today. At baseline, pt has good appetite and eats at least 3 balanced meals and 2 snacks per day. Pt states her UBW is 99 lbs, current wt is 100.53 lbs. Discussed use of oral nutrition supplements with patient.   No PO Intake documented.   Medications reviewed and include: ferrous sulfate, lasix, remeron, deltasone, aldactone  Labs: Na 129 (L)  Diet Order:   Diet Order            Diet Heart Room service appropriate? Yes; Fluid consistency: Thin  Diet effective now                 EDUCATION NEEDS:   No education needs have been identified at this time  Skin:  Skin Assessment: Reviewed RN Assessment  Last BM:  6/11  Height:   Ht Readings from Last 1 Encounters:  02/11/20 5\' 4"  (1.626 m)    Weight:   Wt Readings from Last 1 Encounters:  02/12/20 45.6 kg    Ideal Body  Weight:  56.8 kg  BMI:  Body mass index is 17.26 kg/m.  Estimated Nutritional Needs:   Kcal:  1500-1700  Protein:  70-85 grams  Fluid:  > 1.5 L   Larkin Ina, MS, RD, LDN RD pager number and weekend/on-call pager number located in [place].

## 2020-02-13 NOTE — Discharge Instructions (Signed)

## 2020-02-13 NOTE — Discharge Summary (Signed)
Physician Discharge Summary  Shell Blanchette IDP:[id number] DOB: 08/22/1946 DOA: 02/09/2020  PCP: Gayland Curry, DO  Admit date: 02/09/2020 Discharge date: 02/13/2020  Admitted From: Home  Disposition:  Home with [facility]   Recommendations for Outpatient Follow-up:  1. Follow up with PCP Dr. Mariea Clonts in 1 week 2. Follow up with Dr. Cristina Gong in 2-4 weeks 3. Dr. Mariea Clonts: Please obtain CBC in 1-2 weeks 4. Dr. Trudie Reed: If patient indeed has history of hydralazine induced vasculitis, please communicate this to PCP Dr. Mariea Clonts      Home Health: PT/OT due to ongoing balance issues making it unsafe to leave home  Equipment/Devices: Walker  Discharge Condition: Fair  CODE STATUS: FULL Diet recommendation: Regular     Brief/Interim Summary: Mrs. Silbernagel is a 74 y.o. F with hx RA on Plaquenil, A. Fib on Xarelto, sCHF EF 30-35%, depression, HTN, and COPD not on O2 who presented with 2 days black tarry stools.  Finally, had black diarrhea, was too weak to get up from commode, came to ER.  In the ER, Hgb 4.4 and FOBT+.  BP soft.  Given fluids, blood and the hospitalist service were consulted for GI bleed.      PRINCIPAL HOSPITAL DIAGNOSIS: Acute GI bleed, possibly small bowel source    Discharge Diagnoses:  Acute GI bleed Admitted and started on PPI drip.  Transfused 1.5 units PRBCs.  After transfusion, patient had EGD performed on 6/12 showed no gastric/duodenal source.  Was restarted on Xarelto and monitored for 36 hours and had no clinical bleeding, Hgb remained stable.    Patient underwent capsule endoscopy and will follow up with GI.  If she is found to have persistent blood loss by serial Hgbs, will need discussion of risks benefits of ongoing Xarelto use with GI, Cardiology.     Acute blood loss anemia Hgb 4.4 on admission. Repeat before transfusion up to 6.1 g/dL.  Given 1 1/2 units at time of admission and developed pulmonary edema.  Interestingly, Hgb came up to 11 g/dL with  just 1 1/2 units PRBCs, and so there appears to be some error in initial measurement, in retrospect.   After transfusion and after restarting Xarelto, Hgb monitored 48 hours and remained stable.  No further clinical bleeding.  Acute on chronic systolic CHF Given 1 1/2 units PRBCs on admission and developed respiratory distress. CXR showed new infiltrates.  Started on Lasix and BiPAP and symptoms resolved.  Atrial fibrillation, chronic Rate controlled  Depression/Anxiety Continue Xanax, BuSpar, mirtazapine  Hypertension BP elevated  COPD  Rheumatoid arthritis No active disease.  Continue Plaquenil, daily prednisone  Stage I pressure injury, sacrum, POA  Hyponatremia Mild, asymptomatic, stable  AKI on CKD IIIa Baseline Cr 1.1, up to 1.5 at admission due to anemia.  Resolved back to baseline by the time of discharge.  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  Diagnosed by Dr. Maurice Small in the past.  She had recently been restarted on hydralazine due to persistent hypertension.  However, if she truly has had a hydralazine associated vasculitis, she should avoid this agent.  I defer this to Dr. Trudie Reed, who is more intimately acquainted with her rheumatological history, and recommend patient discuss with Dr. Trudie Reed before restarting hydralazine.             Discharge Instructions  Discharge Instructions    Discharge instructions   Complete by: As directed    From Dr. Loleta Books: You were admitted with bleeding from  the intestines. Here, your endoscopy showed no specific findings in the stomach.    This likely means the bleeding was from your intestines.  You should follow up with Dr. Cristina Gong in his office to discuss the capsule study findings to determine if there is a cause for your bleeding.  In the meantime, resume your home Xarelto and aspirin as you were taking them before.   For your blood pressure, follow  up with Dr. Nyoka Cowden to adjust your medicines.  I will forward your discharge summary to Dr. Trudie Reed to ask her if you really have a sensitivity to hydralazine.  Resume your other home medicines.   Increase activity slowly   Complete by: As directed      Allergies as of 02/13/2020      Reactions   Latex Swelling, Other (See Comments)    fever blisters   Augmentin [amoxicillin-pot Clavulanate] Nausea And Vomiting   Doxycycline Other (See Comments)   Blurred vision, nervous, unsteady   Hydrocodone Itching   Acetaminophen:itching, confusion, hallucinations-side effects-Onset date 07/15/2018   Lactose Intolerance (gi) Other (See Comments)   Unknown, per pt   Lialda [mesalamine] Diarrhea   Prinivil [lisinopril] Diarrhea   Triamcinolone Acetonide Other (See Comments)   REDNESS AND PAIN   Amlodipine Rash   Lotensin [benazepril Hcl] Rash      Medication List    STOP taking these medications   hydrALAZINE 25 MG tablet Commonly known as: APRESOLINE     TAKE these medications   acetaminophen 325 MG tablet Commonly known as: TYLENOL Take 650 mg by mouth every 6 (six) hours as needed for mild pain.   ALPRAZolam 0.5 MG tablet Commonly known as: XANAX Take 1 tablet (0.5 mg total) by mouth 3 (three) times daily as needed for anxiety.   aspirin 81 MG chewable tablet Chew 1 tablet (81 mg total) by mouth daily.   atorvastatin 80 MG tablet Commonly known as: LIPITOR Take 1 tablet (80 mg total) by mouth daily.   busPIRone 30 MG tablet Commonly known as: BUSPAR Take 30 mg by mouth 2 (two) times daily.   calcium carbonate 1500 (600 Ca) MG Tabs tablet Commonly known as: OSCAL Take 600 mg of elemental calcium by mouth daily.   carvedilol 25 MG tablet Commonly known as: COREG Take 1 tablet (25 mg total) by mouth 2 (two) times daily with a meal. What changed:   medication strength  how much to take   denosumab 60 MG/ML Sosy injection Commonly known as: PROLIA Inject 60 mg into  the skin every 6 (six) months.   ferrous sulfate 325 (65 FE) MG tablet Take 325 mg by mouth daily with breakfast.   furosemide 40 MG tablet Commonly known as: LASIX Take 1 tablet (40 mg total) by mouth daily.   hydroxychloroquine 200 MG tablet Commonly known as: PLAQUENIL Take 200 mg by mouth daily.   loperamide 2 MG capsule Commonly known as: IMODIUM Take 1 capsule (2 mg total) by mouth every 6 (six) hours as needed for diarrhea or loose stools.   mirtazapine 7.5 MG tablet Commonly known as: REMERON Take 1 tablet (7.5 mg total) by mouth at bedtime.   multivitamin with minerals Tabs tablet Take 1 tablet by mouth daily.   nicotine 7 mg/24hr patch Commonly known as: NICODERM CQ - dosed in mg/24 hr Place 1 patch (7 mg total) onto the skin daily.   polyethylene glycol 17 g packet Commonly known as: MIRALAX / GLYCOLAX Take 17 g by mouth daily  as needed for mild constipation.   predniSONE 5 MG tablet Commonly known as: DELTASONE Take 5 mg by mouth daily.   Rivaroxaban 15 MG Tabs tablet Commonly known as: XARELTO Take 1 tablet (15 mg total) by mouth daily with supper.   saccharomyces boulardii 250 MG capsule Commonly known as: FLORASTOR Take 1 capsule (250 mg total) by mouth 2 (two) times daily.   spironolactone 25 MG tablet Commonly known as: ALDACTONE Take 0.5 tablets (12.5 mg total) by mouth daily.   SYSTANE FREE OP Place 1 drop into both eyes daily as needed (For Dry eyes).       Follow-up Information    Reed, Tiffany L, DO. Schedule an appointment as soon as possible for a visit in 1 week(s).   Specialty: Geriatric Medicine Contact information: [place] [place] [address] [phone]        Buccini, Herbie [place], MD. Schedule an appointment as soon as possible for a visit in 1 week(s).   Specialty: Gastroenterology Contact information: [address] [place] [address] [phone]              Allergies  Allergen  Reactions  . Latex Swelling and Other (See Comments)     fever blisters  . Augmentin [Amoxicillin-Pot Clavulanate] Nausea And Vomiting  . Doxycycline Other (See Comments)    Blurred vision, nervous, unsteady  . Hydrocodone Itching    Acetaminophen:itching, confusion, hallucinations-side effects-Onset date 07/15/2018  . Lactose Intolerance (Gi) Other (See Comments)    Unknown, per pt  . Lialda [Mesalamine] Diarrhea  . Prinivil [Lisinopril] Diarrhea  . Triamcinolone Acetonide Other (See Comments)    REDNESS AND PAIN  . Amlodipine Rash  . Lotensin [Benazepril Hcl] Rash    Consultations:  GI   Procedures/Studies: DG Chest 2 View  Result Date: 02/09/2020 CLINICAL DATA:  Chest pain and shortness of breath for 1 day. EXAM: CHEST - 2 VIEW COMPARISON:  Single-view of the chest 12/31/2019. PA and lateral chest 09/13/2019. FINDINGS: Lung volumes are low but the lungs are clear. Heart size is normal. Atherosclerosis noted. No pneumothorax or pleural effusion. IMPRESSION: No acute disease. Aortic Atherosclerosis (ICD10-I70.0). Electronically Signed   By: Inge Rise M.D.   On: 02/09/2020 14:14   DG Chest Port 1 View  Result Date: 02/10/2020 CLINICAL DATA:  Chest acute respiratory distress, on CPAP EXAM: PORTABLE CHEST 1 VIEW COMPARISON:  Radiograph 02/09/2020, 06/22/2012 FINDINGS: There has been rapid interval progression of the heterogeneous interstitial and airspace opacity throughout both lungs, right greater than left with some associated interlobular septal thickening and vascular congestion. Cardiomediastinal silhouette is similar to prior accounting for differences in inflation in technique. The aorta is calcified. The remaining cardiomediastinal contours are unremarkable. Telemetry leads overlie the chest. Gaseous distention of the stomach, nonspecific. No other acute osseous or soft tissue abnormality. Degenerative changes are present in the imaged spine and shoulders. Vascular calcium  in the left carotid and right axillary region. IMPRESSION: Rapid interval progression of the heterogeneous interstitial and airspace opacity throughout both lungs, right greater than left, with associated vascular congestion and interlobular septal thickening. Findings could reflect rapidly worsening and slightly asymmetric pulmonary edema or ARDS. Infection is less favored though should be excluded clinically. Electronically Signed   By: Lovena Le M.D.   On: 02/10/2020 00:55       Subjective: Patient is feeling better.  She has had some brown bowel movements overnight, no melena, hematemesis.  No bright red blood per rectum.  No confusion, fever.  No dysuria.  Discharge Exam: Vitals:   02/12/20 2207 02/13/20 0812  BP: 133/74 (!) 145/82  Pulse: 71 72  Resp: 18 19  Temp: 98.3 F (36.8 C) 98.2 F (36.8 C)  SpO2: 97% 97%   Vitals:   02/12/20 0300 02/12/20 0812 02/12/20 2207 02/13/20 0812  BP:  (!) 161/94 133/74 (!) 145/82  Pulse:  78 71 72  Resp:  18 18 19   Temp:  98.7 F (37.1 C) 98.3 F (36.8 C) 98.2 F (36.8 C)  TempSrc:   Oral   SpO2:  100% 97% 97%  Weight: 45.6 kg     Height:        General: Pt is alert, awake, not in acute distress Cardiovascular: RRR, nl S1-S2, no murmurs appreciated.   No LE edema.   Respiratory: Normal respiratory rate and rhythm.  CTAB without rales or wheezes. Abdominal: Abdomen soft and non-tender.  No distension or HSM.   Neuro/Psych: Strength symmetric in upper and lower extremities, just generally weak.  Judgment and insight appear normal.   The results of significant diagnostics from this hospitalization (including imaging, microbiology, ancillary and laboratory) are listed below for reference.     Microbiology: Recent Results (from the past 240 hour(s))  SARS Coronavirus 2 by RT PCR (hospital order, performed in [facility] hospital lab) Nasopharyngeal Nasopharyngeal Swab     Status: None   Collection Time: 02/09/20  5:35 PM    Specimen: Nasopharyngeal Swab  Result Value Ref Range Status   SARS Coronavirus 2 NEGATIVE NEGATIVE Final    Comment: (NOTE) SARS-CoV-2 target nucleic acids are NOT DETECTED.  The SARS-CoV-2 RNA is generally detectable in upper and lower respiratory specimens during the acute phase of infection. The lowest concentration of SARS-CoV-2 viral copies this assay can detect is 250 copies / mL. A negative result does not preclude SARS-CoV-2 infection and should not be used as the sole basis for treatment or other patient management decisions.  A negative result may occur with improper specimen collection / handling, submission of specimen other than nasopharyngeal swab, presence of viral mutation(s) within the areas targeted by this assay, and inadequate number of viral copies (<250 copies / mL). A negative result must be combined with clinical observations, patient history, and epidemiological information.  Fact Sheet for Patients:   StrictlyIdeas.no  Fact Sheet for Healthcare Providers: BankingDealers.co.za  This test is not yet approved or  cleared by the [place] FDA and has been authorized for detection and/or diagnosis of SARS-CoV-2 by FDA under an Emergency Use Authorization (EUA).  This EUA will remain in effect (meaning this test can be used) for the duration of the COVID-19 declaration under Section 564(b)(1) of the Act, 21 U.S.C. section 360bbb-3(b)(1), unless the authorization is terminated or revoked sooner.  Performed at [facility], [place] [address], [place], [place] [address]      Labs: BNP (last 3 results) Recent Labs    02/19/19 0738 12/31/19 0432 02/09/20 1317  BNP 238.0* 3,381.8* 409.8*   Basic Metabolic Panel: Recent Labs  Lab 02/09/20 1317 02/10/20 0220 02/11/20 0559 02/12/20 0413 02/13/20 0258  NA 129* 132* 133* 133* 129*  K 4.8 4.5 4.0 3.3* 4.1  CL 101 107 106 103 101  CO2 20* 18* 18* 21*  22  GLUCOSE 139* 115* 83 89 106*  BUN 35* 32* 24* 19 15  CREATININE 1.58* 1.46* 1.26* 1.28* 1.36*  CALCIUM 8.2* 8.1* 7.6* 7.6* 7.3*   Liver Function Tests:  Recent Labs  Lab 02/10/20 0220 02/12/20 0413 02/13/20 0258  AST 30 23 24   ALT 36 27 27  ALKPHOS 31* 28* 29*  BILITOT 1.0 1.0 0.9  PROT 5.0* 4.4* 4.3*  ALBUMIN 2.8* 2.3* 2.3*   No results for input(s): LIPASE, AMYLASE in the last 168 hours. No results for input(s): AMMONIA in the last 168 hours. CBC: Recent Labs  Lab 02/09/20 1746 02/10/20 0220 02/11/20 0559 02/12/20 0413 02/13/20 0258  WBC 5.5 11.3* 6.3 6.1 7.4  HGB 6.1* 11.3* 10.3* 10.9* 10.4*  HCT 19.1* 33.0* 30.3* 32.8* 31.8*  MCV 103.2* 92.4 94.1 95.3 96.1  PLT 149* 169 158 158 149*   Cardiac Enzymes: No results for input(s): CKTOTAL, CKMB, CKMBINDEX, TROPONINI in the last 168 hours. BNP: Invalid input(s): POCBNP CBG: No results for input(s): GLUCAP in the last 168 hours. D-Dimer No results for input(s): DDIMER in the last 72 hours. Hgb A1c No results for input(s): HGBA1C in the last 72 hours. Lipid Profile No results for input(s): CHOL, HDL, LDLCALC, TRIG, CHOLHDL, LDLDIRECT in the last 72 hours. Thyroid function studies No results for input(s): TSH, T4TOTAL, T3FREE, THYROIDAB in the last 72 hours.  Invalid input(s): FREET3 Anemia work up No results for input(s): VITAMINB12, FOLATE, FERRITIN, TIBC, IRON, RETICCTPCT in the last 72 hours. Urinalysis    Component Value Date/Time   COLORURINE YELLOW 02/12/2020 1847   APPEARANCEUR HAZY (A) 02/12/2020 1847   LABSPEC 1.003 (L) 02/12/2020 1847   PHURINE 6.0 02/12/2020 1847   GLUCOSEU NEGATIVE 02/12/2020 1847   HGBUR SMALL (A) 02/12/2020 1847   BILIRUBINUR NEGATIVE 02/12/2020 1847   KETONESUR NEGATIVE 02/12/2020 1847   PROTEINUR NEGATIVE 02/12/2020 1847   UROBILINOGEN 0.2 09/22/2012 1100   NITRITE NEGATIVE 02/12/2020 1847   LEUKOCYTESUR LARGE (A) 02/12/2020 1847   Sepsis Labs Invalid input(s):  PROCALCITONIN,  WBC,  LACTICIDVEN Microbiology Recent Results (from the past 240 hour(s))  SARS Coronavirus 2 by RT PCR (hospital order, performed in [place] hospital lab) Nasopharyngeal Nasopharyngeal Swab     Status: None   Collection Time: 02/09/20  5:35 PM   Specimen: Nasopharyngeal Swab  Result Value Ref Range Status   SARS Coronavirus 2 NEGATIVE NEGATIVE Final    Comment: (NOTE) SARS-CoV-2 target nucleic acids are NOT DETECTED.  The SARS-CoV-2 RNA is generally detectable in upper and lower respiratory specimens during the acute phase of infection. The lowest concentration of SARS-CoV-2 viral copies this assay can detect is 250 copies / mL. A negative result does not preclude SARS-CoV-2 infection and should not be used as the sole basis for treatment or other patient management decisions.  A negative result may occur with improper specimen collection / handling, submission of specimen other than nasopharyngeal swab, presence of viral mutation(s) within the areas targeted by this assay, and inadequate number of viral copies (<250 copies / mL). A negative result must be combined with clinical observations, patient history, and epidemiological information.  Fact Sheet for Patients:   StrictlyIdeas.no  Fact Sheet for Healthcare Providers: BankingDealers.co.za  This test is not yet approved or  cleared by the [place] FDA and has been authorized for detection and/or diagnosis of SARS-CoV-2 by FDA under an Emergency Use Authorization (EUA).  This EUA will remain in effect (meaning this test can be used) for the duration of the COVID-19 declaration under Section 564(b)(1) of the Act, 21 U.S.C. section 360bbb-3(b)(1), unless the authorization is terminated or revoked sooner.  Performed at [facility], [place],  [place] [address]      Time coordinating discharge: 35  minutes      SIGNED:   Edwin Dada, MD  [facility] 02/13/2020, 5:03 PM

## 2020-02-13 NOTE — TOC Progression Note (Signed)
Transition of Care [facility]) - Progression Note    Patient Details  Name: Uzma Hellmer MRN: [id number] Date of Birth: [birth year]  Transition of Care [facility]) CM/SW [place], [place] Phone Number: 02/13/2020, 1:31 PM  Clinical Narrative:     CSW contacted [facility] and confrimed pt already receiving [facility] services, PT/RN.  CSW met with pt and confirmed plans for pt to return home. CSW explained she is still active with [facility] which pt is satisfied with. Pt explains she has two caretakers at home who are there every day. Explains that her caretaker Anne Ng will transport her home.  Pt states she has DME at home including rollaider, rolling walker, and 3in1. Pt is okay with CSW contacted son and caregiver.   CSW contacted pts Son. Son was concerned about pt not being ready for discharge. He requested to speak with MD. MD will follow up.   CSW contacted Caregiver Anne Ng who confirmed  That she would be transporting pt home.   No other needs identified. Pt to [facility] with [facility]. TOC sign off for now.   Expected Discharge Plan: [place] Two Barriers to Discharge: Continued Medical Work up  Expected Discharge Plan and Services Expected Discharge Plan: [place] arrangements for the past 2 months: Single Family Home Expected Discharge Date: 02/13/20                                     Social Determinants of Health (SDOH) Interventions    Readmission Risk Interventions Readmission Risk Prevention Plan 01/06/2020 02/22/2019 02/16/2019  Transportation Screening Complete Complete Complete  PCP or Specialist Appt within 3-5 Days - - Complete  HRI or [place] - - Complete  Social Work Consult for [place] Planning/Counseling - - Complete  Palliative Care Screening - - Not Applicable  Medication Review Press photographer) Complete Complete Complete  PCP or Specialist appointment within 3-5 days of discharge Complete  Complete -  [place] or Home Care Consult Complete Complete -  SW Recovery Care/Counseling Consult Complete Complete -  Palliative Care Screening Complete Not Applicable -  [place] Not Applicable Not Applicable -  Some recent data might be hidden

## 2020-02-14 ENCOUNTER — Telehealth: Payer: Self-pay

## 2020-02-14 NOTE — Telephone Encounter (Signed)
Message left on clinical intake voicemail requesting verbal orders for:   1.) Therapy 2 x a week for 2 weeks 2.) Therapy 1 x a week for 1 week 3.) Nursing Evaluation for pressure sore for grade 1 sore.  Per [employer] standing order, verbal order given. Message will be sent to patient's provider as a FYI.

## 2020-02-15 DIAGNOSIS — M2550 Pain in unspecified joint: Secondary | ICD-10-CM | POA: Diagnosis not present

## 2020-02-15 DIAGNOSIS — M8000XD Age-related osteoporosis with current pathological fracture, unspecified site, subsequent encounter for fracture with routine healing: Secondary | ICD-10-CM | POA: Diagnosis not present

## 2020-02-15 DIAGNOSIS — K5289 Other specified noninfective gastroenteritis and colitis: Secondary | ICD-10-CM | POA: Diagnosis not present

## 2020-02-15 DIAGNOSIS — M0609 Rheumatoid arthritis without rheumatoid factor, multiple sites: Secondary | ICD-10-CM | POA: Diagnosis not present

## 2020-02-15 DIAGNOSIS — I5020 Unspecified systolic (congestive) heart failure: Secondary | ICD-10-CM | POA: Diagnosis not present

## 2020-02-15 DIAGNOSIS — Z681 Body mass index (BMI) 19 or less, adult: Secondary | ICD-10-CM | POA: Diagnosis not present

## 2020-02-15 DIAGNOSIS — Z79899 Other long term (current) drug therapy: Secondary | ICD-10-CM | POA: Diagnosis not present

## 2020-02-16 DIAGNOSIS — N319 Neuromuscular dysfunction of bladder, unspecified: Secondary | ICD-10-CM | POA: Diagnosis not present

## 2020-02-16 DIAGNOSIS — N179 Acute kidney failure, unspecified: Secondary | ICD-10-CM | POA: Diagnosis not present

## 2020-02-16 DIAGNOSIS — I088 Other rheumatic multiple valve diseases: Secondary | ICD-10-CM | POA: Diagnosis not present

## 2020-02-16 DIAGNOSIS — I5043 Acute on chronic combined systolic (congestive) and diastolic (congestive) heart failure: Secondary | ICD-10-CM | POA: Diagnosis not present

## 2020-02-16 DIAGNOSIS — D7589 Other specified diseases of blood and blood-forming organs: Secondary | ICD-10-CM | POA: Diagnosis not present

## 2020-02-16 DIAGNOSIS — I7300 Raynaud's syndrome without gangrene: Secondary | ICD-10-CM | POA: Diagnosis not present

## 2020-02-16 DIAGNOSIS — I6523 Occlusion and stenosis of bilateral carotid arteries: Secondary | ICD-10-CM | POA: Diagnosis not present

## 2020-02-16 DIAGNOSIS — I130 Hypertensive heart and chronic kidney disease with heart failure and stage 1 through stage 4 chronic kidney disease, or unspecified chronic kidney disease: Secondary | ICD-10-CM | POA: Diagnosis not present

## 2020-02-16 DIAGNOSIS — F329 Major depressive disorder, single episode, unspecified: Secondary | ICD-10-CM | POA: Diagnosis not present

## 2020-02-16 DIAGNOSIS — D631 Anemia in chronic kidney disease: Secondary | ICD-10-CM | POA: Diagnosis not present

## 2020-02-16 DIAGNOSIS — I0981 Rheumatic heart failure: Secondary | ICD-10-CM | POA: Diagnosis not present

## 2020-02-16 DIAGNOSIS — F419 Anxiety disorder, unspecified: Secondary | ICD-10-CM | POA: Diagnosis not present

## 2020-02-16 DIAGNOSIS — J441 Chronic obstructive pulmonary disease with (acute) exacerbation: Secondary | ICD-10-CM | POA: Diagnosis not present

## 2020-02-16 DIAGNOSIS — M810 Age-related osteoporosis without current pathological fracture: Secondary | ICD-10-CM | POA: Diagnosis not present

## 2020-02-16 DIAGNOSIS — N183 Chronic kidney disease, stage 3 unspecified: Secondary | ICD-10-CM | POA: Diagnosis not present

## 2020-02-16 DIAGNOSIS — I447 Left bundle-branch block, unspecified: Secondary | ICD-10-CM | POA: Diagnosis not present

## 2020-02-16 DIAGNOSIS — M170 Bilateral primary osteoarthritis of knee: Secondary | ICD-10-CM | POA: Diagnosis not present

## 2020-02-16 DIAGNOSIS — I4891 Unspecified atrial fibrillation: Secondary | ICD-10-CM | POA: Diagnosis not present

## 2020-02-16 DIAGNOSIS — M8580 Other specified disorders of bone density and structure, unspecified site: Secondary | ICD-10-CM | POA: Diagnosis not present

## 2020-02-16 DIAGNOSIS — J9601 Acute respiratory failure with hypoxia: Secondary | ICD-10-CM | POA: Diagnosis not present

## 2020-02-16 DIAGNOSIS — M0600 Rheumatoid arthritis without rheumatoid factor, unspecified site: Secondary | ICD-10-CM | POA: Diagnosis not present

## 2020-02-16 DIAGNOSIS — M5417 Radiculopathy, lumbosacral region: Secondary | ICD-10-CM | POA: Diagnosis not present

## 2020-02-16 DIAGNOSIS — M5441 Lumbago with sciatica, right side: Secondary | ICD-10-CM | POA: Diagnosis not present

## 2020-02-16 DIAGNOSIS — G834 Cauda equina syndrome: Secondary | ICD-10-CM | POA: Diagnosis not present

## 2020-02-16 DIAGNOSIS — I4892 Unspecified atrial flutter: Secondary | ICD-10-CM | POA: Diagnosis not present

## 2020-02-21 DIAGNOSIS — L89151 Pressure ulcer of sacral region, stage 1: Secondary | ICD-10-CM | POA: Diagnosis not present

## 2020-02-22 ENCOUNTER — Encounter: Payer: Self-pay | Admitting: Nurse Practitioner

## 2020-02-22 ENCOUNTER — Other Ambulatory Visit: Payer: Self-pay

## 2020-02-22 ENCOUNTER — Ambulatory Visit (INDEPENDENT_AMBULATORY_CARE_PROVIDER_SITE_OTHER): Payer: PPO | Admitting: Nurse Practitioner

## 2020-02-22 VITALS — BP 130/88 | HR 77 | Temp 97.8°F | Ht 65.0 in | Wt 91.2 lb

## 2020-02-22 DIAGNOSIS — E871 Hypo-osmolality and hyponatremia: Secondary | ICD-10-CM

## 2020-02-22 DIAGNOSIS — I5043 Acute on chronic combined systolic (congestive) and diastolic (congestive) heart failure: Secondary | ICD-10-CM | POA: Diagnosis not present

## 2020-02-22 DIAGNOSIS — E43 Unspecified severe protein-calorie malnutrition: Secondary | ICD-10-CM

## 2020-02-22 DIAGNOSIS — D62 Acute posthemorrhagic anemia: Secondary | ICD-10-CM | POA: Diagnosis not present

## 2020-02-22 DIAGNOSIS — F411 Generalized anxiety disorder: Secondary | ICD-10-CM

## 2020-02-22 DIAGNOSIS — I10 Essential (primary) hypertension: Secondary | ICD-10-CM

## 2020-02-22 DIAGNOSIS — K5901 Slow transit constipation: Secondary | ICD-10-CM

## 2020-02-22 DIAGNOSIS — L89153 Pressure ulcer of sacral region, stage 3: Secondary | ICD-10-CM

## 2020-02-22 DIAGNOSIS — J42 Unspecified chronic bronchitis: Secondary | ICD-10-CM

## 2020-02-22 LAB — CBC WITH DIFFERENTIAL/PLATELET
Absolute Monocytes: 409 cells/uL (ref 200–950)
Basophils Absolute: 20 cells/uL (ref 0–200)
Basophils Relative: 0.3 %
Eosinophils Absolute: 40 cells/uL (ref 15–500)
Eosinophils Relative: 0.6 %
HCT: 33.1 % — ABNORMAL LOW (ref 35.0–45.0)
Hemoglobin: 11 g/dL — ABNORMAL LOW (ref 11.7–15.5)
Lymphs Abs: 1967 cells/uL (ref 850–3900)
MCH: 31.8 pg (ref 27.0–33.0)
MCHC: 33.2 g/dL (ref 32.0–36.0)
MCV: 95.7 fL (ref 80.0–100.0)
MPV: 10 fL (ref 7.5–12.5)
Monocytes Relative: 6.2 %
Neutro Abs: 4165 cells/uL (ref 1500–7800)
Neutrophils Relative %: 63.1 %
Platelets: 192 10*3/uL (ref 140–400)
RBC: 3.46 10*6/uL — ABNORMAL LOW (ref 3.80–5.10)
RDW: 17.1 % — ABNORMAL HIGH (ref 11.0–15.0)
Total Lymphocyte: 29.8 %
WBC: 6.6 10*3/uL (ref 3.8–10.8)

## 2020-02-22 LAB — BASIC METABOLIC PANEL WITH GFR
BUN/Creatinine Ratio: 20 (calc) (ref 6–22)
BUN: 31 mg/dL — ABNORMAL HIGH (ref 7–25)
CO2: 30 mmol/L (ref 20–32)
Calcium: 9.3 mg/dL (ref 8.6–10.4)
Chloride: 97 mmol/L — ABNORMAL LOW (ref 98–110)
Creat: 1.57 mg/dL — ABNORMAL HIGH (ref 0.60–0.93)
GFR, Est African American: 37 mL/min/{1.73_m2} — ABNORMAL LOW (ref >=60)
GFR, Est Non African American: 32 mL/min/{1.73_m2} — ABNORMAL LOW (ref >=60)
Glucose, Bld: 111 mg/dL — ABNORMAL HIGH (ref 65–99)
Potassium: 4.2 mmol/L (ref 3.5–5.3)
Sodium: 134 mmol/L — ABNORMAL LOW (ref 135–146)

## 2020-02-22 NOTE — Progress Notes (Signed)
Careteam: Patient Care Team: Gayland Curry, DO as PCP - General (Geriatric Medicine) Jerline Pain, MD as PCP - Cardiology (Cardiology) Alyson Ingles Candee Furbish, MD as Consulting Physician (Urology) Kristeen Miss, MD as Consulting Physician (Neurosurgery)  PLACE OF SERVICE:  [place] Directive information Does Patient Have a Medical Advance Directive?: Yes, Type of Advance Directive: Living will, Does patient want to make changes to medical advance directive?: No - Patient declined  Allergies  Allergen Reactions  . Latex Swelling and Other (See Comments)     fever blisters  . Augmentin [Amoxicillin-Pot Clavulanate] Nausea And Vomiting  . Doxycycline Other (See Comments)    Blurred vision, nervous, unsteady  . Hydrocodone Itching    Acetaminophen:itching, confusion, hallucinations-side effects-Onset date 07/15/2018  . Lactose Intolerance (Gi) Other (See Comments)    Unknown, per pt  . Lialda [Mesalamine] Diarrhea  . Prinivil [Lisinopril] Diarrhea  . Triamcinolone Acetonide Other (See Comments)    REDNESS AND PAIN  . Amlodipine Rash  . Lotensin [Benazepril Hcl] Rash    Chief Complaint  Patient presents with  . Transitions Of Care    Discharge date 02/13/2020     HPI: Patient is a 74 y.o. female for hospital follow up.  hx RA on Plaquenil, A. Fib on Xarelto, sCHF EF 30-35%, depression, HTN, and COPD who went to ED due to 2 days black tarry stools. reported black diarrhea, was too weak to get up from commode, came to ER. In the ER, Hgb 4.4 and FOBT+. BP soft. Given fluids, blood and the hospitalist service were consulted for GI bleed. She was given 1 1/2 units of PRBC and hgb came up to 11. No further bleeding noted. Has restarted xarelto. Reports now stools are small and frequent- no dark stools no signs of bleeding.  No shortness of breath or fluid retention.   Weight loss noted- reports she is trying to eat but only getting 2 meals a day. Has protein shakes  and yogurt.    Reports she is very anxious- anxious about coming to the office, doing or saying the wrong thing.  On buspar 30 mg BID, remeron 7.5 (started recently) with xanax 0.5 TID  Has area forming on her bottom followed by home health nursing. Was told that she she needed to change positions frequently    Review of Systems:  Review of Systems  Constitutional: Negative for chills, fever and weight loss.  HENT: Negative for tinnitus.   Respiratory: Negative for cough, sputum production and shortness of breath.   Cardiovascular: Negative for chest pain, palpitations and leg swelling.  Gastrointestinal: Positive for constipation. Negative for abdominal pain, diarrhea and heartburn.  Genitourinary: Negative for dysuria, frequency and urgency.  Musculoskeletal: Negative for back pain, falls, joint pain and myalgias.  Skin:       Pressure ulcer to scarum  Neurological: Negative for dizziness and headaches.  Endo/Heme/Allergies: Bruises/bleeds easily.  Psychiatric/Behavioral: Negative for depression and memory loss. The patient is nervous/anxious. The patient does not have insomnia.     Past Medical History:  Diagnosis Date  . AIN grade I   . Anemia   . Anxiety   . Bruises easily   . Chronic diarrhea   . Chronic hyponatremia   . Colitis   . COPD (chronic obstructive pulmonary disease) ([place])   . Depression   . GERD (gastroesophageal reflux disease)   . History of chronic bronchitis   . History of vulvar dysplasia    serveral recurrency's   .  Hypertension   . Insomnia   . LBBB (left bundle branch block) W/ PROLONGED PR   CARDIOLOGSIT-  DR Johnsie Cancel-   LOV IN EPIC  . Macrocytosis   . Major depressive disorder   . Nocturia   . Osteoporosis   . Raynaud's disease   . Sciatica    right side  . Seronegative rheumatoid arthritis ([place])   . Short of breath on exertion   . Smokers' cough ([place])   . Venous insufficiency of leg    , Edema  . VIN III (vulvar intraepithelial neoplasia  III)    RECURRENT -  . Vitamin B12 deficiency    Past Surgical History:  Procedure Laterality Date  . APPENDECTOMY  1992  . BIOPSY  08/19/2019   Procedure: BIOPSY;  Surgeon: [name], MD;  Location: [place];  Service: Gastroenterology;;  . CARDIOVASCULAR STRESS TEST  06-23-2012  DR Jenkins [place]   NORMAL LEXISCAN MYOVIEW/ EF 83%/  NO ISCHEMIA  . CO2 LASER APPLICATION  81/82/9937   Procedure: CO2 LASER APPLICATION;  Surgeon: Janie Morning, MD PHD;  Location[facility];  Service: Gynecology;  Laterality: N/A;  Laser Vaporization  . CO2 LASER APPLICATION N/A 09/06/9676   Procedure: CO2 LASER APPLICATION;  Surgeon: [name], MD;  Location[facility];  Service: Gynecology;  Laterality: N/A;  . CO2 LASER APPLICATION N/A 9/38/1017   Procedure: CO2 LASER APPLICATION of the vulva;  Surgeon: Janie Morning, MD;  [facility];  Service: Gynecology;  Laterality: N/A;  . COLONOSCOPY WITH PROPOFOL N/A 08/19/2019   Procedure: COLONOSCOPY WITH PROPOFOL;  Surgeon: [name], MD;  Location: [place];  Service: Gastroenterology;  Laterality: N/A;  . ESOPHAGOGASTRODUODENOSCOPY (EGD) WITH PROPOFOL N/A 08/19/2019   Procedure: ESOPHAGOGASTRODUODENOSCOPY (EGD) WITH PROPOFOL;  Surgeon: [name], MD;  Location: [place];  Service: Gastroenterology;  Laterality: N/A;  . ESOPHAGOGASTRODUODENOSCOPY (EGD) WITH PROPOFOL N/A 02/11/2020   Procedure: ESOPHAGOGASTRODUODENOSCOPY (EGD) WITH PROPOFOL;  Surgeon: Ronald Lobo, MD;  Location: [place];  Service: Endoscopy;  Laterality: N/A;  . EXCISION VULVAR LESIONS  07/2012  . GIVENS CAPSULE STUDY N/A 02/12/2020   Procedure: GIVENS CAPSULE STUDY;  Surgeon: Ronald Lobo, MD;  [facility];  Service: Endoscopy;  Laterality: N/A;  . HEMORRHOID SURGERY N/A 04/15/2013   Procedure: Excision of external and internal  hemorrhoid with AIN, Exam under anesthesia;  Surgeon: Odis Hollingshead,  MD;  Location: [facility];  Service: General;  Laterality: N/A;  . HIP PINNING,CANNULATED Right 03/15/2019   Procedure: Maebelle Munroe OF FEMORAL NECK FRACTURE;  Surgeon: Renette Butters, MD;  Location: [place];  Service: Orthopedics;  Laterality: Right;  . POLYPECTOMY  08/19/2019   Procedure: POLYPECTOMY;  Surgeon: [name], MD;  Location: [facility] ENDOSCOPY;  Service: Gastroenterology;;  . REPAIR FISTULA IN ANO/  I & D PERIRECTAL ABSCESS  10-26-2002  DR Johnathan Hausen  . RIGHT/LEFT HEART CATH AND CORONARY ANGIOGRAPHY N/A 01/03/2020   Procedure: RIGHT/LEFT HEART CATH AND CORONARY ANGIOGRAPHY;  Surgeon: Belva Crome, MD;  Location: [place] [facility];  Service: Cardiovascular;  Laterality: N/A;  . TRANSTHORACIC ECHOCARDIOGRAM  06-23-2012   NORMAL LVSF/ EF 55-60%  . VAGINAL HYSTERECTOMY  1997   partial  . VULVAR LESION REMOVAL N/A 11/03/2014   Procedure: CO2 LASER OF VULVAR ;  Surgeon: [name], MD;  Location: [facility];  Service: Gynecology;  Laterality: N/A;  . VULVECTOMY N/A 03/26/2015   Procedure: WIDE LOCA  EXCISION OF VULVAR;  Surgeon: Terrence Dupont  Denman George, MD;  Location: [facility];  Service: Gynecology;  Laterality: N/A;  . Wide Local Excision of labia majora  04-04-2010   Right-sided lesion, CO2 ablation of right labia minora   Social History:   reports that she has been smoking cigarettes. She has never used smokeless tobacco. She reports previous alcohol use of about 2.0 standard drinks of alcohol per week. She reports that she does not use drugs.  Family History  Problem Relation Age of Onset  . Diabetes Mother   . Hypertension Father   . Heart attack Father 64  . Lymphoma Son   . Cancer Son        lymphoblastic lymphoma    Medications: Patient's Medications  New Prescriptions   No medications on file  Previous Medications   ACETAMINOPHEN (TYLENOL) 325 MG TABLET    Take 650 mg by mouth every 6 (six) hours as needed for mild pain.    ALPRAZOLAM (XANAX) 0.5 MG  TABLET    Take 1 tablet (0.5 mg total) by mouth 3 (three) times daily as needed for anxiety.   ASPIRIN 81 MG CHEWABLE TABLET    Chew 1 tablet (81 mg total) by mouth daily.   ATORVASTATIN (LIPITOR) 80 MG TABLET    Take 1 tablet (80 mg total) by mouth daily.   BUSPIRONE (BUSPAR) 30 MG TABLET    Take 30 mg by mouth 2 (two) times daily.    CALCIUM CARBONATE (OSCAL) 1500 (600 CA) MG TABS TABLET    Take 600 mg of elemental calcium by mouth daily.   CARVEDILOL (COREG) 25 MG TABLET    Take 1 tablet (25 mg total) by mouth 2 (two) times daily with a meal.   DENOSUMAB (PROLIA) 60 MG/ML SOSY INJECTION    Inject 60 mg into the skin every 6 (six) months.   FERROUS SULFATE 325 (65 FE) MG TABLET    Take 325 mg by mouth daily with breakfast.   FUROSEMIDE (LASIX) 40 MG TABLET    Take 1 tablet (40 mg total) by mouth daily.   HYDROXYCHLOROQUINE (PLAQUENIL) 200 MG TABLET    Take 200 mg by mouth daily.   LOPERAMIDE (IMODIUM) 2 MG CAPSULE    Take 1 capsule (2 mg total) by mouth every 6 (six) hours as needed for diarrhea or loose stools.   MIRTAZAPINE (REMERON) 7.5 MG TABLET    Take 1 tablet (7.5 mg total) by mouth at bedtime.   MULTIPLE VITAMIN (MULTIVITAMIN WITH MINERALS) TABS TABLET    Take 1 tablet by mouth daily.   POLYETHYL GLYCOL-PROPYL GLYCOL (SYSTANE FREE OP)    Place 1 drop into both eyes daily as needed (For Dry eyes).   POLYETHYLENE GLYCOL (MIRALAX / GLYCOLAX) 17 G PACKET    Take 17 g by mouth daily as needed for mild constipation.    PREDNISONE (DELTASONE) 5 MG TABLET    Take 5 mg by mouth daily.   RIVAROXABAN (XARELTO) 15 MG TABS TABLET    Take 1 tablet (15 mg total) by mouth daily with supper.   SACCHAROMYCES BOULARDII (FLORASTOR) 250 MG CAPSULE    Take 1 capsule (250 mg total) by mouth 2 (two) times daily.   SPIRONOLACTONE (ALDACTONE) 25 MG TABLET    Take 0.5 tablets (12.5 mg total) by mouth daily.  Modified Medications   No medications on file  Discontinued Medications   NICOTINE (NICODERM CQ - DOSED  IN MG/24 HR) 7 MG/24HR PATCH    Place 1 patch (7 mg total)  onto the skin daily.    Physical Exam:  Vitals:   02/22/20 0909  BP: 130/88  Pulse: 77  Temp: 97.8 F (36.6 C)  SpO2: 99%  Weight: 91 lb 3.2 oz (41.4 kg)  Height: 5\' 5"  (1.651 m)   Body mass index is 15.18 kg/m. Wt Readings from Last 3 Encounters:  02/22/20 91 lb 3.2 oz (41.4 kg)  02/12/20 100 lb 8.5 oz (45.6 kg)  01/16/20 95 lb 6.4 oz (43.3 kg)    Physical Exam Constitutional:      General: She is not in acute distress.    Appearance: She is underweight. She is not diaphoretic.  HENT:     Head: Normocephalic and atraumatic.     Mouth/Throat:     Pharynx: No oropharyngeal exudate.  Eyes:     Conjunctiva/sclera: Conjunctivae normal.     Pupils: Pupils are equal, round, and reactive to light.  Cardiovascular:     Rate and Rhythm: Normal rate and regular rhythm.     Heart sounds: Normal heart sounds.  Pulmonary:     Effort: Pulmonary effort is normal.     Breath sounds: Normal breath sounds.  Abdominal:     General: Bowel sounds are normal.     Palpations: Abdomen is soft.  Musculoskeletal:        General: No tenderness.     Cervical back: Normal range of motion and neck supple.     Right lower leg: No edema.     Left lower leg: No edema.  Skin:    General: Skin is warm and dry.     Findings: Bruising present.     Comments: Redness noted to sacral area with small pinpoint area of breakdown noted.   Neurological:     Mental Status: She is alert and oriented to person, place, and time.  Psychiatric:        Mood and Affect: Mood normal.        Behavior: Behavior normal.     Labs reviewed: Basic Metabolic Panel: Recent Labs    03/13/19 0246 12/31/19 0644 12/31/19 0939 01/01/20 0357 01/05/20 0248 01/05/20 1025 01/06/20 0820 02/09/20 1317 02/11/20 0559 02/12/20 0413 02/13/20 0258  NA  --   --   --    < >   < >  --  124*   < > 133* 133* 129*  K  --   --   --    < >   < >  --  4.2   < > 4.0  3.3* 4.1  CL  --   --   --    < >   < >  --  94*   < > 106 103 101  CO2  --   --   --    < >   < >  --  25   < > 18* 21* 22  GLUCOSE  --   --   --    < >   < >  --  111*   < > 83 89 106*  BUN  --   --   --    < >   < >  --  28*   < > 24* 19 15  CREATININE  --   --   --    < >   < >  --  1.07*   < > 1.26* 1.28* 1.36*  CALCIUM  --   --   --    < >   < >  --  7.9*   < > 7.6* 7.6* 7.3*  MG   < >  --  1.8  --   --  1.9 2.7*  --   --   --   --   TSH  --  2.889  --   --   --   --   --   --   --   --   --    < > = values in this interval not displayed.   Liver Function Tests: Recent Labs    02/10/20 0220 02/12/20 0413 02/13/20 0258  AST 30 23 24   ALT 36 27 27  ALKPHOS 31* 28* 29*  BILITOT 1.0 1.0 0.9  PROT 5.0* 4.4* 4.3*  ALBUMIN 2.8* 2.3* 2.3*   No results for input(s): LIPASE, AMYLASE in the last 8760 hours. No results for input(s): AMMONIA in the last 8760 hours. CBC: Recent Labs    03/29/19 0000 04/26/19 0000 08/17/19 1504 08/17/19 1855 12/31/19 0432 01/03/20 0345 02/11/20 0559 02/12/20 0413 02/13/20 0258  WBC 5.8   < > 9.4   < > 15.6*   < > 6.3 6.1 7.4  NEUTROABS 3  --  8.4*  --  10.6*  --   --   --   --   HGB 9.9*   < > 9.9*   < > 12.2   < > 10.3* 10.9* 10.4*  HCT 28*   < > 28.7*   < > 35.7*   < > 30.3* 32.8* 31.8*  MCV  --   --  107.9*   < > 103.5*   < > 94.1 95.3 96.1  PLT 251   < > 236   < > 267   < > 158 158 149*   < > = values in this interval not displayed.   Lipid Panel: No results for input(s): CHOL, HDL, LDLCALC, TRIG, CHOLHDL, LDLDIRECT in the last 8760 hours. TSH: Recent Labs    12/31/19 0644  TSH 2.889   A1C: Lab Results  Component Value Date   HGBA1C 5.4 09/24/2017     Assessment/Plan 1. Acute on chronic combined systolic and diastolic CHF (congestive heart failure) ([place]) euvolemic at this time, continues on lasix and aldactone.   2. Hyponatremia -ongoing, she is also on multiple diuretic due to heart failure, will follow up at this  time. - BASIC METABOLIC PANEL WITH GFR  3. Generalized anxiety disorder Ongoing, remeron was added at last office visit. Will continue remeron, buspar, and xanax PRN  4. Acute blood loss anemia No further bleeding noted at this time. Continues on iron supplement  - CBC with Differential/Platelet  5. Pressure injury of sacral region, stage 3 ([place]) -noted, educated to increase protein intake, offloading, changing position often, to make sure she was not sitting or laying on hard surfaces. Using pressure reducing surfaces. To use mepilex, can change every 3 days or if soiled. Nursing to continue to monitor.   6. Chronic bronchitis, unspecified chronic bronchitis type ([place]) Overall stable. Without worsening of shortness of breath, cough or congestion.   7. Protein-calorie malnutrition, severe -weight loss noted, continues on remeron qhs, encouraged her to increase protein in diet and restart protein supplement daily   8. Slow transit constipation -encouraged her to restart protein supplement and yoguart as she states this helps with her BMs, she has miralax to use if needed.   9. Hypertension Off hydralazine, blood pressure controlled at this time.   Next appt: 4 weeks with Dr Mariea Clonts  [address] [address]. [place], [facility] [phone]

## 2020-02-22 NOTE — Patient Instructions (Signed)
Continue remeron 7.5 mg by mouth at bedtime for sleep and appetite and mood  To restart your protein supplement and yogurt (greek yogurt has more protein) 3 nutritious meals a day

## 2020-02-23 ENCOUNTER — Other Ambulatory Visit: Payer: Self-pay

## 2020-02-23 ENCOUNTER — Telehealth: Payer: Self-pay

## 2020-02-23 MED ORDER — RIVAROXABAN 15 MG PO TABS: 15.0000 mg | ORAL_TABLET | Freq: Every day | ORAL | 1 refills | Status: DC

## 2020-02-23 NOTE — Telephone Encounter (Signed)
Pharmacy called because [facility] called to get approval for a refill of Xarelto spoke with Janett Billow because they said in her note she had said to continue this medication she said this was not so that the hospital note had the medication still on patients list and that she had no problems while taking but it would be alright to send refill to [facility] for patient  Refill request sent

## 2020-02-29 NOTE — Telephone Encounter (Signed)
A user error has taken place This encounter was created in error - please disregard. 

## 2020-03-04 ENCOUNTER — Inpatient Hospital Stay (HOSPITAL_COMMUNITY)
Admission: EM | Admit: 2020-03-04 | Discharge: 2020-03-08 | DRG: 378 | Disposition: A | Discharge status: Home Health | Payer: PPO | Attending: Family Medicine | Admitting: Family Medicine

## 2020-03-04 ENCOUNTER — Encounter (HOSPITAL_COMMUNITY): Payer: Self-pay | Admitting: Internal Medicine

## 2020-03-04 ENCOUNTER — Other Ambulatory Visit: Payer: Self-pay

## 2020-03-04 ENCOUNTER — Emergency Department (HOSPITAL_COMMUNITY): Payer: PPO

## 2020-03-04 DIAGNOSIS — N390 Urinary tract infection, site not specified: Secondary | ICD-10-CM | POA: Diagnosis not present

## 2020-03-04 DIAGNOSIS — Z8679 Personal history of other diseases of the circulatory system: Secondary | ICD-10-CM

## 2020-03-04 DIAGNOSIS — K219 Gastro-esophageal reflux disease without esophagitis: Secondary | ICD-10-CM | POA: Diagnosis present

## 2020-03-04 DIAGNOSIS — I872 Venous insufficiency (chronic) (peripheral): Secondary | ICD-10-CM | POA: Diagnosis present

## 2020-03-04 DIAGNOSIS — Z885 Allergy status to narcotic agent status: Secondary | ICD-10-CM

## 2020-03-04 DIAGNOSIS — I447 Left bundle-branch block, unspecified: Secondary | ICD-10-CM | POA: Diagnosis present

## 2020-03-04 DIAGNOSIS — I160 Hypertensive urgency: Secondary | ICD-10-CM | POA: Diagnosis present

## 2020-03-04 DIAGNOSIS — R0789 Other chest pain: Secondary | ICD-10-CM | POA: Diagnosis not present

## 2020-03-04 DIAGNOSIS — M0600 Rheumatoid arthritis without rheumatoid factor, unspecified site: Secondary | ICD-10-CM | POA: Diagnosis present

## 2020-03-04 DIAGNOSIS — Z20822 Contact with and (suspected) exposure to covid-19: Secondary | ICD-10-CM | POA: Diagnosis present

## 2020-03-04 DIAGNOSIS — Z881 Allergy status to other antibiotic agents status: Secondary | ICD-10-CM

## 2020-03-04 DIAGNOSIS — J449 Chronic obstructive pulmonary disease, unspecified: Secondary | ICD-10-CM | POA: Diagnosis present

## 2020-03-04 DIAGNOSIS — Z7901 Long term (current) use of anticoagulants: Secondary | ICD-10-CM | POA: Diagnosis not present

## 2020-03-04 DIAGNOSIS — Z91040 Latex allergy status: Secondary | ICD-10-CM

## 2020-03-04 DIAGNOSIS — F32A Depression, unspecified: Secondary | ICD-10-CM

## 2020-03-04 DIAGNOSIS — R0602 Shortness of breath: Secondary | ICD-10-CM

## 2020-03-04 DIAGNOSIS — R778 Other specified abnormalities of plasma proteins: Secondary | ICD-10-CM | POA: Diagnosis not present

## 2020-03-04 DIAGNOSIS — L89151 Pressure ulcer of sacral region, stage 1: Secondary | ICD-10-CM | POA: Diagnosis present

## 2020-03-04 DIAGNOSIS — F17210 Nicotine dependence, cigarettes, uncomplicated: Secondary | ICD-10-CM | POA: Diagnosis present

## 2020-03-04 DIAGNOSIS — E8809 Other disorders of plasma-protein metabolism, not elsewhere classified: Secondary | ICD-10-CM | POA: Diagnosis present

## 2020-03-04 DIAGNOSIS — I130 Hypertensive heart and chronic kidney disease with heart failure and stage 1 through stage 4 chronic kidney disease, or unspecified chronic kidney disease: Secondary | ICD-10-CM | POA: Diagnosis present

## 2020-03-04 DIAGNOSIS — I25119 Atherosclerotic heart disease of native coronary artery with unspecified angina pectoris: Secondary | ICD-10-CM | POA: Diagnosis present

## 2020-03-04 DIAGNOSIS — M810 Age-related osteoporosis without current pathological fracture: Secondary | ICD-10-CM | POA: Diagnosis present

## 2020-03-04 DIAGNOSIS — Z79899 Other long term (current) drug therapy: Secondary | ICD-10-CM

## 2020-03-04 DIAGNOSIS — I499 Cardiac arrhythmia, unspecified: Secondary | ICD-10-CM | POA: Diagnosis not present

## 2020-03-04 DIAGNOSIS — I482 Chronic atrial fibrillation, unspecified: Secondary | ICD-10-CM | POA: Diagnosis present

## 2020-03-04 DIAGNOSIS — K449 Diaphragmatic hernia without obstruction or gangrene: Secondary | ICD-10-CM | POA: Diagnosis not present

## 2020-03-04 DIAGNOSIS — I10 Essential (primary) hypertension: Secondary | ICD-10-CM

## 2020-03-04 DIAGNOSIS — R079 Chest pain, unspecified: Secondary | ICD-10-CM | POA: Diagnosis not present

## 2020-03-04 DIAGNOSIS — Z87412 Personal history of vulvar dysplasia: Secondary | ICD-10-CM

## 2020-03-04 DIAGNOSIS — D62 Acute posthemorrhagic anemia: Secondary | ICD-10-CM | POA: Diagnosis present

## 2020-03-04 DIAGNOSIS — K222 Esophageal obstruction: Secondary | ICD-10-CM | POA: Diagnosis present

## 2020-03-04 DIAGNOSIS — I213 ST elevation (STEMI) myocardial infarction of unspecified site: Secondary | ICD-10-CM | POA: Diagnosis not present

## 2020-03-04 DIAGNOSIS — I5022 Chronic systolic (congestive) heart failure: Secondary | ICD-10-CM | POA: Diagnosis present

## 2020-03-04 DIAGNOSIS — Z90710 Acquired absence of both cervix and uterus: Secondary | ICD-10-CM

## 2020-03-04 DIAGNOSIS — I517 Cardiomegaly: Secondary | ICD-10-CM | POA: Diagnosis not present

## 2020-03-04 DIAGNOSIS — D509 Iron deficiency anemia, unspecified: Secondary | ICD-10-CM | POA: Diagnosis not present

## 2020-03-04 DIAGNOSIS — F419 Anxiety disorder, unspecified: Secondary | ICD-10-CM | POA: Diagnosis present

## 2020-03-04 DIAGNOSIS — K921 Melena: Secondary | ICD-10-CM

## 2020-03-04 DIAGNOSIS — Z7952 Long term (current) use of systemic steroids: Secondary | ICD-10-CM

## 2020-03-04 DIAGNOSIS — G4700 Insomnia, unspecified: Secondary | ICD-10-CM | POA: Diagnosis present

## 2020-03-04 DIAGNOSIS — B9620 Unspecified Escherichia coli [E. coli] as the cause of diseases classified elsewhere: Secondary | ICD-10-CM | POA: Diagnosis present

## 2020-03-04 DIAGNOSIS — N179 Acute kidney failure, unspecified: Secondary | ICD-10-CM | POA: Diagnosis present

## 2020-03-04 DIAGNOSIS — F329 Major depressive disorder, single episode, unspecified: Secondary | ICD-10-CM | POA: Diagnosis present

## 2020-03-04 DIAGNOSIS — K922 Gastrointestinal hemorrhage, unspecified: Secondary | ICD-10-CM | POA: Diagnosis present

## 2020-03-04 DIAGNOSIS — Z888 Allergy status to other drugs, medicaments and biological substances status: Secondary | ICD-10-CM

## 2020-03-04 DIAGNOSIS — K2971 Gastritis, unspecified, with bleeding: Principal | ICD-10-CM | POA: Diagnosis present

## 2020-03-04 DIAGNOSIS — R9431 Abnormal electrocardiogram [ECG] [EKG]: Secondary | ICD-10-CM | POA: Diagnosis not present

## 2020-03-04 DIAGNOSIS — R7989 Other specified abnormal findings of blood chemistry: Secondary | ICD-10-CM | POA: Diagnosis present

## 2020-03-04 DIAGNOSIS — Z7982 Long term (current) use of aspirin: Secondary | ICD-10-CM

## 2020-03-04 DIAGNOSIS — E871 Hypo-osmolality and hyponatremia: Secondary | ICD-10-CM | POA: Diagnosis present

## 2020-03-04 DIAGNOSIS — Z91011 Allergy to milk products: Secondary | ICD-10-CM

## 2020-03-04 DIAGNOSIS — I428 Other cardiomyopathies: Secondary | ICD-10-CM | POA: Diagnosis present

## 2020-03-04 DIAGNOSIS — E785 Hyperlipidemia, unspecified: Secondary | ICD-10-CM | POA: Diagnosis present

## 2020-03-04 DIAGNOSIS — K3189 Other diseases of stomach and duodenum: Secondary | ICD-10-CM | POA: Diagnosis not present

## 2020-03-04 DIAGNOSIS — N1832 Chronic kidney disease, stage 3b: Secondary | ICD-10-CM | POA: Diagnosis present

## 2020-03-04 DIAGNOSIS — R195 Other fecal abnormalities: Secondary | ICD-10-CM | POA: Diagnosis not present

## 2020-03-04 DIAGNOSIS — Z8249 Family history of ischemic heart disease and other diseases of the circulatory system: Secondary | ICD-10-CM

## 2020-03-04 HISTORY — DX: Cardiomyopathy, unspecified: I42.9

## 2020-03-04 HISTORY — DX: Gastrointestinal hemorrhage, unspecified: K92.2

## 2020-03-04 HISTORY — DX: Atherosclerotic heart disease of native coronary artery without angina pectoris: I25.10

## 2020-03-04 HISTORY — DX: Personal history of other diseases of the circulatory system: Z86.79

## 2020-03-04 HISTORY — DX: Essential (primary) hypertension: I10

## 2020-03-04 LAB — TROPONIN I (HIGH SENSITIVITY)
Troponin I (High Sensitivity): 24 ng/L — ABNORMAL HIGH (ref <18)
Troponin I (High Sensitivity): 29 ng/L — ABNORMAL HIGH (ref <18)

## 2020-03-04 LAB — COMPREHENSIVE METABOLIC PANEL
ALT: 31 U/L (ref 0–44)
ALT: 33 U/L (ref 0–44)
AST: 28 U/L (ref 15–41)
AST: 29 U/L (ref 15–41)
Albumin: 2.7 g/dL — ABNORMAL LOW (ref 3.5–5.0)
Albumin: 3.1 g/dL — ABNORMAL LOW (ref 3.5–5.0)
Alkaline Phosphatase: 39 U/L (ref 38–126)
Alkaline Phosphatase: 41 U/L (ref 38–126)
Anion gap: 9 (ref 5–15)
Anion gap: 10 (ref 5–15)
BUN: 36 mg/dL — ABNORMAL HIGH (ref 8–23)
BUN: 34 mg/dL — ABNORMAL HIGH (ref 8–23)
CO2: 23 mmol/L (ref 22–32)
CO2: 26 mmol/L (ref 22–32)
Calcium: 8.7 mg/dL — ABNORMAL LOW (ref 8.9–10.3)
Calcium: 9.1 mg/dL (ref 8.9–10.3)
Chloride: 95 mmol/L — ABNORMAL LOW (ref 98–111)
Chloride: 95 mmol/L — ABNORMAL LOW (ref 98–111)
Creatinine, Ser: 1.58 mg/dL — ABNORMAL HIGH (ref 0.44–1.00)
Creatinine, Ser: 1.71 mg/dL — ABNORMAL HIGH (ref 0.44–1.00)
GFR calc Af Amer: 37 mL/min — ABNORMAL LOW (ref >60)
GFR calc Af Amer: 33 mL/min — ABNORMAL LOW (ref >60)
GFR calc non Af Amer: 32 mL/min — ABNORMAL LOW (ref >60)
GFR calc non Af Amer: 29 mL/min — ABNORMAL LOW (ref >60)
Glucose, Bld: 128 mg/dL — ABNORMAL HIGH (ref 70–99)
Glucose, Bld: 108 mg/dL — ABNORMAL HIGH (ref 70–99)
Potassium: 4.3 mmol/L (ref 3.5–5.1)
Potassium: 4.5 mmol/L (ref 3.5–5.1)
Sodium: 127 mmol/L — ABNORMAL LOW (ref 135–145)
Sodium: 131 mmol/L — ABNORMAL LOW (ref 135–145)
Total Bilirubin: 0.4 mg/dL (ref 0.3–1.2)
Total Bilirubin: 0.6 mg/dL (ref 0.3–1.2)
Total Protein: 5.2 g/dL — ABNORMAL LOW (ref 6.5–8.1)
Total Protein: 6 g/dL — ABNORMAL LOW (ref 6.5–8.1)

## 2020-03-04 LAB — IRON AND TIBC
Iron: 99 ug/dL (ref 28–170)
Saturation Ratios: 29 % (ref 10.4–31.8)
TIBC: 339 ug/dL (ref 250–450)
UIBC: 240 ug/dL

## 2020-03-04 LAB — CBC WITH DIFFERENTIAL/PLATELET
Abs Immature Granulocytes: 0.03 10*3/uL (ref 0.00–0.07)
Basophils Absolute: 0 10*3/uL (ref 0.0–0.1)
Basophils Relative: 0 %
Eosinophils Absolute: 0 10*3/uL (ref 0.0–0.5)
Eosinophils Relative: 0 %
HCT: 26.1 % — ABNORMAL LOW (ref 36.0–46.0)
Hemoglobin: 8.7 g/dL — ABNORMAL LOW (ref 12.0–15.0)
Immature Granulocytes: 0 %
Lymphocytes Relative: 27 %
Lymphs Abs: 2 10*3/uL (ref 0.7–4.0)
MCH: 31.4 pg (ref 26.0–34.0)
MCHC: 33.3 g/dL (ref 30.0–36.0)
MCV: 94.2 fL (ref 80.0–100.0)
Monocytes Absolute: 0.6 10*3/uL (ref 0.1–1.0)
Monocytes Relative: 8 %
Neutro Abs: 4.8 10*3/uL (ref 1.7–7.7)
Neutrophils Relative %: 65 %
Platelets: 164 10*3/uL (ref 150–400)
RBC: 2.77 MIL/uL — ABNORMAL LOW (ref 3.87–5.11)
RDW: 17 % — ABNORMAL HIGH (ref 11.5–15.5)
WBC: 7.5 10*3/uL (ref 4.0–10.5)
nRBC: 0 % (ref 0.0–0.2)

## 2020-03-04 LAB — CBC
HCT: 29.9 % — ABNORMAL LOW (ref 36.0–46.0)
Hemoglobin: 10.1 g/dL — ABNORMAL LOW (ref 12.0–15.0)
MCH: 31.5 pg (ref 26.0–34.0)
MCHC: 33.8 g/dL (ref 30.0–36.0)
MCV: 93.1 fL (ref 80.0–100.0)
Platelets: 182 10*3/uL (ref 150–400)
RBC: 3.21 MIL/uL — ABNORMAL LOW (ref 3.87–5.11)
RDW: 17 % — ABNORMAL HIGH (ref 11.5–15.5)
WBC: 8.9 10*3/uL (ref 4.0–10.5)
nRBC: 0 % (ref 0.0–0.2)

## 2020-03-04 LAB — TYPE AND SCREEN
ABO/RH(D): A POS
Antibody Screen: NEGATIVE

## 2020-03-04 LAB — POC OCCULT BLOOD, ED: Fecal Occult Bld: POSITIVE — AB

## 2020-03-04 LAB — PROTIME-INR
INR: 1.8 — ABNORMAL HIGH (ref 0.8–1.2)
Prothrombin Time: 20.3 seconds — ABNORMAL HIGH (ref 11.4–15.2)

## 2020-03-04 LAB — BRAIN NATRIURETIC PEPTIDE: B Natriuretic Peptide: 1072.2 pg/mL — ABNORMAL HIGH (ref 0.0–100.0)

## 2020-03-04 LAB — VITAMIN B12: Vitamin B-12: 681 pg/mL (ref 180–914)

## 2020-03-04 LAB — SARS CORONAVIRUS 2 BY RT PCR (HOSPITAL ORDER, PERFORMED IN ~~LOC~~ HOSPITAL LAB): SARS Coronavirus 2: NEGATIVE

## 2020-03-04 LAB — FERRITIN: Ferritin: 84 ng/mL (ref 11–307)

## 2020-03-04 LAB — FOLATE: Folate: 45.8 ng/mL (ref >5.9)

## 2020-03-04 MED ORDER — BUSPIRONE HCL 10 MG PO TABS
30.0000 mg | ORAL_TABLET | Freq: Two times a day (BID) | ORAL | Status: DC
  Administered 2020-03-04 – 2020-03-08 (×8): 30 mg via ORAL
  Filled 2020-03-04 (×8): qty 3

## 2020-03-04 MED ORDER — NITROGLYCERIN 0.4 MG SL SUBL: 0.4000 mg | SUBLINGUAL_TABLET | SUBLINGUAL | Status: DC | PRN

## 2020-03-04 MED ORDER — FERROUS SULFATE 325 (65 FE) MG PO TABS
325.0000 mg | ORAL_TABLET | Freq: Every day | ORAL | Status: DC
  Administered 2020-03-05 – 2020-03-08 (×4): 325 mg via ORAL
  Filled 2020-03-04 (×4): qty 1

## 2020-03-04 MED ORDER — HYDRALAZINE HCL 10 MG PO TABS
20.0000 mg | ORAL_TABLET | Freq: Three times a day (TID) | ORAL | Status: DC
  Administered 2020-03-04: 20 mg via ORAL
  Filled 2020-03-04: qty 2

## 2020-03-04 MED ORDER — SODIUM CHLORIDE 0.9 % IV SOLN
8.0000 mg/h | INTRAVENOUS | Status: DC
  Administered 2020-03-04 – 2020-03-05 (×3): 8 mg/h via INTRAVENOUS
  Filled 2020-03-04 (×6): qty 80

## 2020-03-04 MED ORDER — PREDNISONE 5 MG PO TABS
5.0000 mg | ORAL_TABLET | Freq: Every day | ORAL | Status: DC
  Administered 2020-03-04 – 2020-03-08 (×5): 5 mg via ORAL
  Filled 2020-03-04 (×5): qty 1

## 2020-03-04 MED ORDER — CARVEDILOL 25 MG PO TABS: 25.0000 mg | ORAL_TABLET | Freq: Two times a day (BID) | ORAL | Status: DC

## 2020-03-04 MED ORDER — SPIRONOLACTONE 25 MG PO TABS: 25.0000 mg | ORAL_TABLET | Freq: Every day | ORAL | Status: DC

## 2020-03-04 MED ORDER — ATORVASTATIN CALCIUM 80 MG PO TABS
80.0000 mg | ORAL_TABLET | Freq: Every day | ORAL | Status: DC
  Administered 2020-03-04 – 2020-03-08 (×6): 80 mg via ORAL
  Filled 2020-03-04 (×6): qty 1

## 2020-03-04 MED ORDER — SPIRONOLACTONE 12.5 MG HALF TABLET
12.5000 mg | ORAL_TABLET | Freq: Every day | ORAL | Status: DC
  Administered 2020-03-04 – 2020-03-05 (×2): 12.5 mg via ORAL
  Filled 2020-03-04 (×2): qty 1

## 2020-03-04 MED ORDER — SODIUM CHLORIDE 0.9 % IV SOLN
80.0000 mg | Freq: Once | INTRAVENOUS | Status: AC
  Administered 2020-03-04: 80 mg via INTRAVENOUS
  Filled 2020-03-04: qty 80

## 2020-03-04 MED ORDER — FUROSEMIDE 10 MG/ML IJ SOLN
40.0000 mg | Freq: Once | INTRAMUSCULAR | Status: AC
  Administered 2020-03-04: 40 mg via INTRAVENOUS
  Filled 2020-03-04: qty 4

## 2020-03-04 MED ORDER — ALPRAZOLAM 0.5 MG PO TABS
0.5000 mg | ORAL_TABLET | Freq: Three times a day (TID) | ORAL | Status: DC | PRN
  Administered 2020-03-04 – 2020-03-08 (×9): 0.5 mg via ORAL
  Filled 2020-03-04 (×10): qty 1

## 2020-03-04 MED ORDER — ISOSORBIDE MONONITRATE ER 30 MG PO TB24
15.0000 mg | ORAL_TABLET | Freq: Every day | ORAL | Status: DC
  Administered 2020-03-04 – 2020-03-08 (×5): 15 mg via ORAL
  Filled 2020-03-04 (×5): qty 1

## 2020-03-04 MED ORDER — PANTOPRAZOLE SODIUM 40 MG IV SOLR: 40.0000 mg | Freq: Two times a day (BID) | INTRAVENOUS | Status: DC

## 2020-03-04 MED ORDER — CARVEDILOL 12.5 MG PO TABS
12.5000 mg | ORAL_TABLET | Freq: Two times a day (BID) | ORAL | Status: DC
  Administered 2020-03-04 – 2020-03-08 (×8): 12.5 mg via ORAL
  Filled 2020-03-04 (×8): qty 1

## 2020-03-04 MED ORDER — MIRTAZAPINE 15 MG PO TABS
7.5000 mg | ORAL_TABLET | Freq: Every day | ORAL | Status: DC
  Administered 2020-03-04 – 2020-03-07 (×4): 7.5 mg via ORAL
  Filled 2020-03-04 (×5): qty 1

## 2020-03-04 MED ORDER — SODIUM CHLORIDE 0.9 % IV SOLN: INTRAVENOUS | Status: DC

## 2020-03-04 NOTE — Consult Note (Signed)
Cardiology Consultation:   Patient ID: Emmajo Bennette; [id number]; May 14, 1946   Admit date: 03/04/2020 Date of Consult: 03/04/2020  Primary Care Provider: Gayland Curry, DO Primary Cardiologist: Candee Furbish, MD Primary Electrophysiologist: None   Patient Profile:   Regina Ortiz is a 74 y.o. female with a history of hypertension, recently documented cardiomyopathy with LVEF 30 to 35%, moderate mitral regurgitation, CAD with heavily calcified bifurcational LAD/diagonal disease managed medically as of May, transiently documented atrial fibrillation with initiation of Xarelto in May, COPD with chronic hypoxic respiratory failure, GI bleed who is being seen today for the evaluation of chest pain in the setting of recurrent anemia and suspected recurrent GI bleed at the request of Dr. Florene Glen.  History of Present Illness:   Ms. Canton presents to the hospital complaining of recent shortness of breath and chest discomfort, hypertensive over baseline, found to have recurrent anemia with heme positive stools and is admitted for further work-up.  She was recently hospitalized with GI bleed and hemoglobin down to 4.4 requiring PRBC transfusion, temporarily off Xarelto, and with evaluation by gastroenterology including EGD showing no acute source of bleeding (colonoscopy from December 2020 had just a few tubular adenomas, diverticulosis, and internal hemorrhoids), and unremarkable capsule study more recently.  She has been reevaluated by gastroenterology with plan for follow-up EGD, Xarelto has been held.  Hemoglobin 8 7 up to 10 1 without transfusion.  From a cardiac perspective, she does not appear fluid overloaded.  Chest x-ray shows improvement of previous interstitial findings.  Weights have been stable.  BNP 1072 and high-sensitivity troponin I levels are not suggestive of ACS, 24 and 29.  ECG shows sinus rhythm with old left bundle branch block.  She had a cardiac catheterization recently  in May at which point plan was medical therapy for treatment of moderate LAD/diagonal bifurcation disease that was heavily calcified.  Most recent echocardiogram showed LVEF in the range of 30 to 35% with global hypokinesis and moderate mitral regurgitation.  Outpatient cardiac regimen included Aldactone, Coreg, and Lasix.  Presumably not on ARB/Entresto due to renal insufficiency.  Past Medical History:  Diagnosis Date  . Anemia   . Anxiety   . Bruises easily   . CAD (coronary artery disease)    Bifurcational LAD/diagonal disease managed medically  . Cardiomyopathy ([place])   . Chronic hyponatremia   . Colitis   . COPD (chronic obstructive pulmonary disease) ([place])   . Depression   . Essential hypertension   . GERD (gastroesophageal reflux disease)   . GI bleed   . History of atrial fibrillation   . History of chronic bronchitis   . Insomnia   . LBBB (left bundle branch block)   . Macrocytosis   . Major depressive disorder   . Nocturia   . Osteoporosis   . Raynaud's disease   . Sciatica    Right side  . Seronegative rheumatoid arthritis ([place])   . Venous insufficiency of leg   . VIN III (vulvar intraepithelial neoplasia III)    Recurrent  . Vitamin B12 deficiency     Past Surgical History:  Procedure Laterality Date  . APPENDECTOMY  1992  . BIOPSY  08/19/2019   Procedure: BIOPSY;  Surgeon: [name], MD;  Location: [place];  Service: Gastroenterology;;  . CARDIOVASCULAR STRESS TEST  06-23-2012  DR Jenkins [place]   NORMAL LEXISCAN MYOVIEW/ EF 83%/  NO ISCHEMIA  . CO2 LASER APPLICATION  10/93/2355   Procedure: CO2 LASER APPLICATION;  Surgeon:  Janie Morning, MD PHD;  Location: [facility];  Service: Gynecology;  Laterality: N/A;  Laser Vaporization  . CO2 LASER APPLICATION N/A 02/02/1496   Procedure: CO2 LASER APPLICATION;  Surgeon: [name], MD;  Location: [facility];  Service: Gynecology;  Laterality: N/A;  . CO2 LASER APPLICATION N/A  0/26/3785   Procedure: CO2 LASER APPLICATION of the vulva;  Surgeon: Janie Morning, MD;  Location: [facility];  Service: Gynecology;  Laterality: N/A;  . COLONOSCOPY WITH PROPOFOL N/A 08/19/2019   Procedure: COLONOSCOPY WITH PROPOFOL;  Surgeon: [name], MD;  Location: [place];  Service: Gastroenterology;  Laterality: N/A;  . ESOPHAGOGASTRODUODENOSCOPY (EGD) WITH PROPOFOL N/A 08/19/2019   Procedure: ESOPHAGOGASTRODUODENOSCOPY (EGD) WITH PROPOFOL;  Surgeon: [name], MD;  Location: [place];  Service: Gastroenterology;  Laterality: N/A;  . ESOPHAGOGASTRODUODENOSCOPY (EGD) WITH PROPOFOL N/A 02/11/2020   Procedure: ESOPHAGOGASTRODUODENOSCOPY (EGD) WITH PROPOFOL;  Surgeon: Ronald Lobo, MD;  Location: [place];  Service: Endoscopy;  Laterality: N/A;  . EXCISION VULVAR LESIONS  07/2012  . GIVENS CAPSULE STUDY N/A 02/12/2020   Procedure: GIVENS CAPSULE STUDY;  Surgeon: Ronald Lobo, MD;  Location: [facility];  Service: Endoscopy;  Laterality: N/A;  . HEMORRHOID SURGERY N/A 04/15/2013   Procedure: Excision of external and internal  hemorrhoid with AIN, Exam under anesthesia;  Surgeon: Odis Hollingshead, MD;  Location: [facility];  Service: General;  Laterality: N/A;  . HIP PINNING,CANNULATED Right 03/15/2019   Procedure: Maebelle Munroe OF FEMORAL NECK FRACTURE;  Surgeon: Renette Butters, MD;  Location: [place];  Service: Orthopedics;  Laterality: Right;  . POLYPECTOMY  08/19/2019   Procedure: POLYPECTOMY;  Surgeon: [name], MD;  Location: [facility];  Service: Gastroenterology;;  . REPAIR FISTULA IN ANO/  I & D PERIRECTAL ABSCESS  10-26-2002  DR Johnathan Hausen  . RIGHT/LEFT HEART CATH AND CORONARY ANGIOGRAPHY N/A 01/03/2020   Procedure: RIGHT/LEFT HEART CATH AND CORONARY ANGIOGRAPHY;  Surgeon: Belva Crome, MD;  Location: [place] [facility];  Service: Cardiovascular;  Laterality: N/A;  . TRANSTHORACIC ECHOCARDIOGRAM  06-23-2012   NORMAL LVSF/ EF 55-60%  . VAGINAL  HYSTERECTOMY  1997   partial  . VULVAR LESION REMOVAL N/A 11/03/2014   Procedure: CO2 LASER OF VULVAR ;  Surgeon: [name], MD;  Location: [facility];  Service: Gynecology;  Laterality: N/A;  . VULVECTOMY N/A 03/26/2015   Procedure: WIDE LOCA  EXCISION OF VULVAR;  Surgeon: [name], MD;  Location: [place];  Service: Gynecology;  Laterality: N/A;  . Wide Local Excision of labia majora  04-04-2010   Right-sided lesion, CO2 ablation of right labia minora     Inpatient Medications: Scheduled Meds: . [START ON 03/07/2020] pantoprazole  40 mg Intravenous Q12H   Continuous Infusions: . pantoprozole (PROTONIX) infusion 8 mg/hr (03/04/20 0501)   PRN Meds: nitroGLYCERIN  Allergies:    Allergies  Allergen Reactions  . Latex Swelling and Other (See Comments)     fever blisters  . Augmentin [Amoxicillin-Pot Clavulanate] Nausea And Vomiting  . Doxycycline Other (See Comments)    Blurred vision, nervous, unsteady  . Hydrocodone Itching    Acetaminophen:itching, confusion, hallucinations-side effects-Onset date 07/15/2018  . Lactose Intolerance (Gi) Other (See Comments)    Unknown, per pt  . Lialda [Mesalamine] Diarrhea  . Prinivil [Lisinopril] Diarrhea  . Triamcinolone Acetonide Other (See Comments)    REDNESS AND PAIN  . Amlodipine Rash  . Lotensin [Benazepril Hcl] Rash    Social History:  Social History   Tobacco Use  . Smoking status: Current Every Day Smoker    Types: Cigarettes  . Smokeless tobacco: Never Used  . Tobacco comment: 2 cigarettes a day  Substance Use Topics  . Alcohol use: Not Currently    Alcohol/week: 2.0 standard drinks    Types: 2 Cans of beer per week    Family History:   The patient's family history includes Cancer in her son; Diabetes in her mother; Heart attack (age of onset: 80) in her father; Hypertension in her father; Lymphoma in her son.  ROS:  Anxiety, back pain.  Physical Exam/Data:   Vitals:    03/04/20 0621 03/04/20 0642 03/04/20 0729 03/04/20 1117  BP:  (!) 170/82 (!) 176/98 (!) 173/98  Pulse:  79 80 77  Resp:  20 18 18   Temp: 97.9 F (36.6 C) 97.9 F (36.6 C) 97.9 F (36.6 C) 97.9 F (36.6 C)  TempSrc: Oral Oral Oral Oral  SpO2:  100% 100% 100%  Weight:  41 kg    Height:  5\' 4"  (1.626 m)      Intake/Output Summary (Last 24 hours) at 03/04/2020 1128 Last data filed at 03/04/2020 0900 Gross per 24 hour  Intake 165 ml  Output 201 ml  Net -36 ml   Filed Weights   03/04/20 0642  Weight: 41 kg   Body mass index is 15.52 kg/m.   Gen: Frail-appearing elderly woman in no obvious distress. HEENT: Conjunctiva and lids normal, oropharynx clear. Neck: Supple, no elevated JVP or carotid bruits, no thyromegaly. Lungs: Decreased breath sounds throughout, no wheezing. Cardiac: Regular rate and rhythm, no S3, 2/6 apical systolic murmur, no pericardial rub. Abdomen: Soft, nontender, bowel sounds present. Extremities: No pitting edema, distal pulses 2+. Skin: Warm and dry. Musculoskeletal: Mild kyphosis. Neuropsychiatric: Alert and oriented x3, affect grossly appropriate.  EKG:  An ECG dated 03/04/2020 was personally reviewed today and demonstrated:  Sinus rhythm with left bundle branch block.  Telemetry:  I personally reviewed telemetry which shows sinus rhythm.  Relevant CV Studies:  Echocardiogram 01/01/2020: 1. Moderate to severe global reduction in LV systolic function; grade 1  diastolic dysfunction; moderate LVH; moderate MR; severe LAE.  2. Left ventricular ejection fraction, by estimation, is 30 to 35%. The  left ventricle has moderately decreased function. The left ventricle  demonstrates global hypokinesis. There is moderate left ventricular  hypertrophy. Left ventricular diastolic  parameters are consistent with Grade I diastolic dysfunction (impaired  relaxation).  3. Right ventricular systolic function is normal. The right ventricular  size is normal.  4.  Left atrial size was severely dilated.  5. The mitral valve is normal in structure. Moderate mitral valve  regurgitation. No evidence of mitral stenosis.  6. The aortic valve is tricuspid. Aortic valve regurgitation is not  visualized. Mild aortic valve sclerosis is present, with no evidence of  aortic valve stenosis.  7. The inferior vena cava is normal in size with greater than 50%  respiratory variability, suggesting right atrial pressure of 3 mmHg.   Cardiac catheterization 01/03/2020:  Heavy left main and proximal LAD calcification.  Moderate to moderately severe proximal LAD diagonal bifurcation stenosis up to 70%.  Widely patent circumflex.  Widely patent right coronary.  Mid anterior wall hypokinesis greater than other walls related to ischemia from LAD versus dyssynchrony associated with left bundle branch block.  Estimated LVEF is 35 to 40%.  LVEDP is 25 mmHg.  Significant mitral regurgitation by 2D Doppler echocardiogram.  Severe hypertension  noted during the procedure.  RECOMMENDATIONS:   Medical therapy of heart failure including additional diuresis.  Preventive therapy including high intensity statin therapy for LAD disease.  Downstream management could include PCI with debulking using atherectomy versus combined coronary artery bypass grafting and mitral valve repair depending upon the status of the mitral valve and role in development of acute pulmonary edema.  Once stable, may need transesophageal echocardiography.  Further discussion concerning severity of mitral valve disease on appropriate management.  Reassess kidney function in a.m.  In lab hypertension treated with IV hydralazine.  Aggressive management of hypertension is required and may have been the driving precipitant for acute heart failure.  Laboratory Data:  Chemistry Recent Labs  Lab 03/04/20 0117 03/04/20 1015  NA 127* 131*  K 4.3 4.5  CL 95* 95*  CO2 23 26  GLUCOSE 128* 108*    BUN 36* 34*  CREATININE 1.58* 1.71*  CALCIUM 8.7* 9.1  GFRNONAA 32* 29*  GFRAA 37* 33*  ANIONGAP 9 10    Recent Labs  Lab 03/04/20 0117 03/04/20 1015  PROT 5.2* 6.0*  ALBUMIN 2.7* 3.1*  AST 28 29  ALT 31 33  ALKPHOS 39 41  BILITOT 0.4 0.6   Hematology Recent Labs  Lab 03/04/20 0117 03/04/20 1015  WBC 7.5 8.9  RBC 2.77* 3.21*  HGB 8.7* 10.1*  HCT 26.1* 29.9*  MCV 94.2 93.1  MCH 31.4 31.5  MCHC 33.3 33.8  RDW 17.0* 17.0*  PLT 164 182   Cardiac Enzymes Recent Labs  Lab 02/09/20 1317 02/09/20 1505 03/04/20 0117 03/04/20 0313  TROPONINIHS 18* 16 24* 29*   BNP Recent Labs  Lab 03/04/20 0117  BNP 1,072.2*     Radiology/Studies:  DG Chest Portable 1 View  Result Date: 03/04/2020 CLINICAL DATA:  Chest pain. EXAM: PORTABLE CHEST 1 VIEW COMPARISON:  02/10/2020. FINDINGS: Cardiomegaly. Unchanged mediastinal contours with aortic atherosclerosis. Resolved interstitial opacities from prior exam. No pleural fluid or pneumothorax. No new airspace disease. There may be trace fluid in the right minor fissure. No acute osseous abnormalities are seen. IMPRESSION: 1. Stable cardiomegaly and aortic atherosclerosis. 2. Resolved interstitial opacities from prior exam. No new findings. Electronically Signed   By: Keith Rake M.D.   On: 03/04/2020 02:25    Assessment and Plan:   1.  History of transient atrial fibrillation/flutter documented during hospital stay in May per Dr. Debara Pickett.  CHA2DS2-VASc score is 6.  She has been on Xarelto since that time although it has been interrupted now twice due to GI bleeding.  She is in sinus rhythm at this time.  2.  Secondary cardiomyopathy, likely nonischemic with LVEF 30 to 35% and global hypokinesis by echocardiogram in May.  3.  Heavily calcified bifurcational LAD/diagonal disease by cardiac catheterization in May, managed medically at that point.  Still has substrate for recurrent angina particularly in the setting of elevated blood  pressure and anemia.  High-sensitivity troponin I levels were not suggestive of ACS and ECG is stable.  4.  Recurrent GI bleed with plan for follow-up EGD by gastroenterology.  Patient seen and examined.  I reviewed extensive records and updated the chart.  Agree with stopping Xarelto, would not plan on resuming it at this point in light of two GI bleeds within the last month and no clear source for correction and reduction in risk of recurrent GI bleeding.  In terms of her cardiomyopathy would plan on resuming Aldactone and Coreg, also likely hydralazine/nitrate combination.  She does not  appear fluid overloaded, might be able to use Lasix on an as-needed basis going forward.  Our service will follow with you.  Signed, Rozann Lesches, MD  03/04/2020 11:28 AM

## 2020-03-04 NOTE — ED Provider Notes (Signed)
TIME SEEN: 2:10 AM  CHIEF COMPLAINT: Chest pain and shortness of breath  HPI: Patient is a 74 year old female with history of A. fib on Xarelto, CHF, hypertension, COPD not on home oxygen, rheumatoid arthritis on Plaquenil who presents to the emergency department with central chest pressure, tightness that started tonight and radiated into her throat.  She is also having shortness of breath.  Symptoms better with sitting upright.  No nausea, vomiting, diaphoresis or dizziness.  No fever or cough.  Feels like both of her legs have been swollen.  States CP started yesterday afternoon.   It appears patient was just discharged from the hospital on 02/13/2020 for melena.  Found to have a hemoglobin of 4.4 at that time.  Given blood and started on PPI drip.  ED on 02/11/2020 showed no gastric or duodenal source.  Xarelto was restarted and monitored for 36 hours with no bleeding.  Hemoglobin stable.  Patient underwent capsule endoscopy on 02/14/2020 which showed no evidence of bleeding in the small bowel.  She states that she thinks that her bleeding has improved and she has not seen melena in a couple of days.    Cath 01/03/20:   Heavy left main and proximal LAD calcification.  Moderate to moderately severe proximal LAD diagonal bifurcation stenosis up to 70%.  Widely patent circumflex.  Widely patent right coronary.  Mid anterior wall hypokinesis greater than other walls related to ischemia from LAD versus dyssynchrony associated with left bundle branch block.  Estimated LVEF is 35 to 40%.  LVEDP is 25 mmHg.  Significant mitral regurgitation by 2D Doppler echocardiogram.  Severe hypertension noted during the procedure.  RECOMMENDATIONS:   Medical therapy of heart failure including additional diuresis.  Preventive therapy including high intensity statin therapy for LAD disease.  Downstream management could include PCI with debulking using atherectomy versus combined coronary artery bypass  grafting and mitral valve repair depending upon the status of the mitral valve and role in development of acute pulmonary edema.  Once stable, may need transesophageal echocardiography.  Further discussion concerning severity of mitral valve disease on appropriate management.  Reassess kidney function in a.m.  In lab hypertension treated with IV hydralazine.  Aggressive management of hypertension is required and may have been the driving precipitant for acute heart failure.   Echo 01/01/20:  IMPRESSIONS    1. Moderate to severe global reduction in LV systolic function; grade 1  diastolic dysfunction; moderate LVH; moderate MR; severe LAE.  2. Left ventricular ejection fraction, by estimation, is 30 to 35%. The  left ventricle has moderately decreased function. The left ventricle  demonstrates global hypokinesis. There is moderate left ventricular  hypertrophy. Left ventricular diastolic  parameters are consistent with Grade I diastolic dysfunction (impaired  relaxation).  3. Right ventricular systolic function is normal. The right ventricular  size is normal.  4. Left atrial size was severely dilated.  5. The mitral valve is normal in structure. Moderate mitral valve  regurgitation. No evidence of mitral stenosis.  6. The aortic valve is tricuspid. Aortic valve regurgitation is not  visualized. Mild aortic valve sclerosis is present, with no evidence of  aortic valve stenosis.  7. The inferior vena cava is normal in size with greater than 50%  respiratory variability, suggesting right atrial pressure of 3 mmHg.   ROS: See HPI Constitutional: no fever  Eyes: no drainage  ENT: no runny nose   Cardiovascular:   chest pain  Resp: Patient SOB  GI: no vomiting GU: no  dysuria Integumentary: no rash  Allergy: no hives  Musculoskeletal: no leg swelling  Neurological: no slurred speech ROS otherwise negative  PAST MEDICAL HISTORY/PAST SURGICAL HISTORY:  Past Medical  History:  Diagnosis Date  . AIN grade I   . Anemia   . Anxiety   . Bruises easily   . Chronic diarrhea   . Chronic hyponatremia   . Colitis   . COPD (chronic obstructive pulmonary disease) ([place])   . Depression   . GERD (gastroesophageal reflux disease)   . History of chronic bronchitis   . History of vulvar dysplasia    serveral recurrency's   . Hypertension   . Insomnia   . LBBB (left bundle branch block) W/ PROLONGED PR   CARDIOLOGSIT-  DR Johnsie Cancel-   LOV IN EPIC  . Macrocytosis   . Major depressive disorder   . Nocturia   . Osteoporosis   . Raynaud's disease   . Sciatica    right side  . Seronegative rheumatoid arthritis ([place])   . Short of breath on exertion   . Smokers' cough ([place])   . Venous insufficiency of leg    , Edema  . VIN III (vulvar intraepithelial neoplasia III)    RECURRENT -  . Vitamin B12 deficiency     MEDICATIONS:  Prior to Admission medications   Medication Sig Start Date End Date Taking? Authorizing Provider  acetaminophen (TYLENOL) 325 MG tablet Take 650 mg by mouth every 6 (six) hours as needed for mild pain.     [provider]  ALPRAZolam Duanne Moron) 0.5 MG tablet Take 1 tablet (0.5 mg total) by mouth 3 (three) times daily as needed for anxiety. 01/06/20   Georgette Shell, MD  aspirin 81 MG chewable tablet Chew 1 tablet (81 mg total) by mouth daily. 01/07/20   Georgette Shell, MD  atorvastatin (LIPITOR) 80 MG tablet Take 1 tablet (80 mg total) by mouth daily. 01/07/20   Georgette Shell, MD  busPIRone (BUSPAR) 30 MG tablet Take 30 mg by mouth 2 (two) times daily.     [provider]  calcium carbonate (OSCAL) 1500 (600 Ca) MG TABS tablet Take 600 mg of elemental calcium by mouth daily.    [provider]  carvedilol (COREG) 25 MG tablet Take 1 tablet (25 mg total) by mouth 2 (two) times daily with a meal. 02/13/20   Danford, Suann Larry, MD  denosumab (PROLIA) 60 MG/ML SOSY injection Inject 60 mg into the skin every  6 (six) months.    [provider]  ferrous sulfate 325 (65 FE) MG tablet Take 325 mg by mouth daily with breakfast.    [provider]  furosemide (LASIX) 40 MG tablet Take 1 tablet (40 mg total) by mouth daily. 01/07/20   Georgette Shell, MD  hydroxychloroquine (PLAQUENIL) 200 MG tablet Take 200 mg by mouth daily. 11/27/19   [provider]  loperamide (IMODIUM) 2 MG capsule Take 1 capsule (2 mg total) by mouth every 6 (six) hours as needed for diarrhea or loose stools. 01/06/20   Georgette Shell, MD  mirtazapine (REMERON) 7.5 MG tablet Take 1 tablet (7.5 mg total) by mouth at bedtime. 01/16/20   Reed, Tiffany L, DO  Multiple Vitamin (MULTIVITAMIN WITH MINERALS) TABS tablet Take 1 tablet by mouth daily. 01/07/20   Georgette Shell, MD  Polyethyl Glycol-Propyl Glycol (SYSTANE FREE OP) Place 1 drop into both eyes daily as needed (For Dry eyes).    [provider]  polyethylene glycol (MIRALAX / GLYCOLAX) 17 g packet Take 17 g by mouth daily as needed for mild constipation.     [provider]  predniSONE (DELTASONE) 5 MG tablet Take 5 mg by mouth daily.    [provider]  Rivaroxaban (XARELTO) 15 MG TABS tablet Take 1 tablet (15 mg total) by mouth daily with supper. 02/23/20   Lauree Chandler, NP  saccharomyces boulardii (FLORASTOR) 250 MG capsule Take 1 capsule (250 mg total) by mouth 2 (two) times daily. 01/06/20   Georgette Shell, MD  spironolactone (ALDACTONE) 25 MG tablet Take 0.5 tablets (12.5 mg total) by mouth daily. 01/07/20   Georgette Shell, MD    ALLERGIES:  Allergies  Allergen Reactions  . Latex Swelling and Other (See Comments)     fever blisters  . Augmentin [Amoxicillin-Pot Clavulanate] Nausea And Vomiting  . Doxycycline Other (See Comments)    Blurred vision, nervous, unsteady  . Hydrocodone Itching    Acetaminophen:itching, confusion, hallucinations-side effects-Onset date 07/15/2018  . Lactose Intolerance  (Gi) Other (See Comments)    Unknown, per pt  . Lialda [Mesalamine] Diarrhea  . Prinivil [Lisinopril] Diarrhea  . Triamcinolone Acetonide Other (See Comments)    REDNESS AND PAIN  . Amlodipine Rash  . Lotensin [Benazepril Hcl] Rash    SOCIAL HISTORY:  Social History   Tobacco Use  . Smoking status: Current Every Day Smoker    Types: Cigarettes  . Smokeless tobacco: Never Used  . Tobacco comment: 2 cigarettes a day  Substance Use Topics  . Alcohol use: Not Currently    Alcohol/week: 2.0 standard drinks    Types: 2 Cans of beer per week    FAMILY HISTORY: Family History  Problem Relation Age of Onset  . Diabetes Mother   . Hypertension Father   . Heart attack Father 53  . Lymphoma Son   . Cancer Son        lymphoblastic lymphoma    EXAM: BP (!) 194/93   Pulse 74   Resp 14   LMP  (LMP Unknown)   SpO2 100%  CONSTITUTIONAL: Alert and oriented and responds appropriately to questions.  Elderly, in no distress HEAD: Normocephalic EYES: Conjunctivae clear, pupils appear equal, EOM appear intact ENT: normal nose; moist mucous membranes NECK: Supple, normal ROM CARD: RRR; S1 and S2 appreciated; no murmurs, no clicks, no rubs, no gallops RESP: Normal chest excursion without splinting or tachypnea; breath sounds clear and equal bilaterally; no wheezes, no rhonchi, no rales, no hypoxia or respiratory distress, speaking full sentences ABD/GI: Normal bowel sounds; non-distended; soft, non-tender, no rebound, no guarding, no peritoneal signs, no hepatosplenomegaly RECTAL:  Normal rectal tone, melena on exam, guaiac positive, no hemorrhoids appreciated, nontender rectal exam, no fecal impaction\ BACK:  The back appears normal EXT: Normal ROM in all joints; no deformity noted, +1 edema in distal bilateral lower extremities; no cyanosis, no calf tenderness or calf swelling SKIN: Normal color for age and race; warm; no rash on exposed skin NEURO: Moves all extremities equally PSYCH:  The patient's mood and manner are appropriate.   MEDICAL DECISION MAKING: Patient here with chest pain, shortness of breath.  History of CHF with EF of 35%.  Does not appear significantly volume overloaded today.  Did have 70% stenosis of the LAD on cath in May 2021.  Cardiac labs pending as well as BNP, chest x-ray.  EKG currently shows no new ischemic change.  Will repeat EKG and give nitroglycerin.  Will hold  aspirin at this time as she appears she is having black stools and her hemoglobin has gone from 11.0 on 02/22/2020 down to 8.7 today.  Hemoccult pending.  ED PROGRESS: Patient is guaiac positive.  Will start on Protonix.  Her EKG showed new ischemic changes in inferior leads.  I have had the cardiology fellow on-call Dr. Emilio Aspen review these EKGs.  He agrees they do not meet [place] criteria.  Her first high-sensitivity troponin is 24 and she does report chest pain started almost 12 hours ago.  At this time I do not feel she meets STEMI criteria nor does the cardiologist.  And this could be from demand from another GI bleed.  Will discuss with medicine for admission.  BNP also elevated at 1072.  She does feel like her legs are swollen although I do not appreciate significant edema on exam and her chest x-ray is clear.  She does describe orthopnea.  Will give dose of IV Lasix.  Second troponin is flat at 29.  Will discuss with hospitalist for admission.  Patient updated with plan.  4:24 AM Discussed patient's case with hospitalist, Dr. Josephine Cables.  I have recommended admission and patient (and family if present) agree with this plan. Admitting physician will place admission orders.   I reviewed all nursing notes, vitals, pertinent previous records and reviewed/interpreted all EKGs, lab and urine results, imaging (as available).     EKG Interpretation  Date/Time:  Sunday March 04 2020 01:07:05 EDT Ventricular Rate:  95 PR Interval:    QRS Duration: 148 QT Interval:  462 QTC  Calculation: 581 R Axis:   53 Text Interpretation: Sinus tachycardia Ventricular bigeminy Left bundle branch block NEW ST depression and T wave inversions in inferior leads Reconfirmed by Toiya Morrish, Cyril Mourning [phone]) on 03/04/2020 3:05:11 AM         EKG Interpretation  Date/Time:  Sunday March 04 2020 02:25:03 EDT Ventricular Rate:  74 PR Interval:    QRS Duration: 151 QT Interval:  439 QTC Calculation: 488 R Axis:   65 Text Interpretation: Incomplete analysis due to missing data in precordial lead(s) Sinus rhythm Consider left atrial enlargement Left bundle branch block Missing lead(s): V4 ST depression and T wave inversions in inferior leads Confirmed by Pryor Curia [phone]) on 03/04/2020 3:07:40 AM        EKG Interpretation  Date/Time:  Sunday March 04 2020 03:47:07 EDT Ventricular Rate:  79 PR Interval:    QRS Duration: 145 QT Interval:  424 QTC Calculation: 487 R Axis:   57 Text Interpretation: Sinus rhythm Left bundle branch block No significant change since last tracing Confirmed by Kyllian Clingerman, Cyril Mourning [phone]) on 03/04/2020 3:54:41 AM         CRITICAL CARE Performed by: Cyril Mourning Fabio Wah   Total critical care time: 45 minutes  Critical care time was exclusive of separately billable procedures and treating other patients.  Critical care was necessary to treat or prevent imminent or life-threatening deterioration.  Critical care was time spent personally by me on the following activities: development of treatment plan with patient and/or surrogate as well as nursing, discussions with consultants, evaluation of patient's response to treatment, examination of patient, obtaining history from patient or surrogate, ordering and performing treatments and interventions, ordering and review of laboratory studies, ordering and review of radiographic studies, pulse oximetry and re-evaluation of patient's condition.   Regina Ortiz was evaluated in Emergency Department on 03/04/2020 for the  symptoms described in the history of present illness. She was evaluated  in the context of the global COVID-19 pandemic, which necessitated consideration that the patient might be at risk for infection with the SARS-CoV-2 virus that causes COVID-19. Institutional protocols and algorithms that pertain to the evaluation of patients at risk for COVID-19 are in a state of rapid change based on information released by regulatory bodies including the CDC and federal and state organizations. These policies and algorithms were followed during the patient's care in the ED.      Steel Kerney, Delice Bison, DO 03/04/20 0425

## 2020-03-04 NOTE — ED Triage Notes (Signed)
BIB [facility] after pt c/o of C/P and sHOB after getting up and going to the bathroom. Pt reports laying down and pain unrelieved. Pt also reports taking xanax at 0015 with some release. EMS gave 324 aspirin and 1 dose of SL Nitro. Pt reports decrease c/p after nitro. Pt b/p 166/83 upion arrival, HR 73.

## 2020-03-04 NOTE — Consult Note (Signed)
[facility] Consult  Referring Provider: [facility] Primary Care Physician:  Gayland Curry, DO Primary Gastroenterologist: Dr.Christapher Gillian/[facility]  Reason for Consultation: Anemia, FOBT positive stool  HPI: Regina Ortiz is a 74 y.o. female was admitted today with complaints of chest pain, midsternal, pressure-like with radiation to the throat.  On admission she was found to have a hemoglobin of 8.7, baseline hemoglobin is 11, repeat hemoglobin is 10.1 without transfusion. Patient states that since discharge she has had a few episodes of dark possibly black stools. She has not noted any fresh blood in stool. She denies nausea, vomiting, abdominal pain. To be noted, she recently had an endoscopy on 02/11/2020 which was unremarkable along with a capsule endoscopy on 02/14/2020 which was unremarkable and her last colonoscopy was in 08/2019 with removal of a few tubular adenomas, diverticulosis, internal hemorrhoids and biopsies negative for microscopic colitis.  Patient is on Xarelto for atrial fibrillation.   Past Medical History:  Diagnosis Date  . AIN grade I   . Anemia   . Anxiety   . Bruises easily   . Chronic diarrhea   . Chronic hyponatremia   . Colitis   . COPD (chronic obstructive pulmonary disease) ([place])   . Depression   . GERD (gastroesophageal reflux disease)   . History of chronic bronchitis   . History of vulvar dysplasia    serveral recurrency's   . Hypertension   . Insomnia   . LBBB (left bundle branch block) W/ PROLONGED PR   CARDIOLOGSIT-  DR Johnsie Cancel-   LOV IN EPIC  . Macrocytosis   . Major depressive disorder   . Nocturia   . Osteoporosis   . Raynaud's disease   . Sciatica    right side  . Seronegative rheumatoid arthritis ([place])   . Short of breath on exertion   . Smokers' cough ([place])   . Venous insufficiency of leg    , Edema  . VIN III (vulvar intraepithelial neoplasia III)    RECURRENT -  . Vitamin B12 deficiency     Past  Surgical History:  Procedure Laterality Date  . APPENDECTOMY  1992  . BIOPSY  08/19/2019   Procedure: BIOPSY;  Surgeon: [name], MD;  Location: [place];  Service: Gastroenterology;;  . CARDIOVASCULAR STRESS TEST  06-23-2012  DR Jenkins [place]   NORMAL LEXISCAN MYOVIEW/ EF 83%/  NO ISCHEMIA  . CO2 LASER APPLICATION  50/05/3817   Procedure: CO2 LASER APPLICATION;  Surgeon: Janie Morning, MD PHD;  Location: [facility];  Service: Gynecology;  Laterality: N/A;  Laser Vaporization  . CO2 LASER APPLICATION N/A 10/10/9369   Procedure: CO2 LASER APPLICATION;  Surgeon: [name], MD;  Location: [facility];  Service: Gynecology;  Laterality: N/A;  . CO2 LASER APPLICATION N/A 6/96/7893   Procedure: CO2 LASER APPLICATION of the vulva;  Surgeon: Janie Morning, MD;  Location: [facility];  Service: Gynecology;  Laterality: N/A;  . COLONOSCOPY WITH PROPOFOL N/A 08/19/2019   Procedure: COLONOSCOPY WITH PROPOFOL;  Surgeon: [name], MD;  Location: [place];  Service: Gastroenterology;  Laterality: N/A;  . ESOPHAGOGASTRODUODENOSCOPY (EGD) WITH PROPOFOL N/A 08/19/2019   Procedure: ESOPHAGOGASTRODUODENOSCOPY (EGD) WITH PROPOFOL;  Surgeon: [name], MD;  Location: [place];  Service: Gastroenterology;  Laterality: N/A;  . ESOPHAGOGASTRODUODENOSCOPY (EGD) WITH PROPOFOL N/A 02/11/2020   Procedure: ESOPHAGOGASTRODUODENOSCOPY (EGD) WITH PROPOFOL;  Surgeon: Ronald Lobo, MD;  Location: [place];  Service: Endoscopy;  Laterality: N/A;  . EXCISION VULVAR LESIONS  07/2012  . GIVENS CAPSULE STUDY N/A 02/12/2020   Procedure: GIVENS CAPSULE STUDY;  Surgeon: Ronald Lobo, MD;  Location: [facility];  Service: Endoscopy;  Laterality: N/A;  . HEMORRHOID SURGERY N/A 04/15/2013   Procedure: Excision of external and internal  hemorrhoid with AIN, Exam under anesthesia;  Surgeon: Odis Hollingshead, MD;  Location: [facility];  Service: General;  Laterality: N/A;   . HIP PINNING,CANNULATED Right 03/15/2019   Procedure: Maebelle Munroe OF FEMORAL NECK FRACTURE;  Surgeon: Renette Butters, MD;  Location: [place];  Service: Orthopedics;  Laterality: Right;  . POLYPECTOMY  08/19/2019   Procedure: POLYPECTOMY;  Surgeon: [name], MD;  Location: [facility];  Service: Gastroenterology;;  . REPAIR FISTULA IN ANO/  I & D PERIRECTAL ABSCESS  10-26-2002  DR Johnathan Hausen  . RIGHT/LEFT HEART CATH AND CORONARY ANGIOGRAPHY N/A 01/03/2020   Procedure: RIGHT/LEFT HEART CATH AND CORONARY ANGIOGRAPHY;  Surgeon: Belva Crome, MD;  Location: [place] [facility];  Service: Cardiovascular;  Laterality: N/A;  . TRANSTHORACIC ECHOCARDIOGRAM  06-23-2012   NORMAL LVSF/ EF 55-60%  . VAGINAL HYSTERECTOMY  1997   partial  . VULVAR LESION REMOVAL N/A 11/03/2014   Procedure: CO2 LASER OF VULVAR ;  Surgeon: [name], MD;  Location: [facility];  Service: Gynecology;  Laterality: N/A;  . VULVECTOMY N/A 03/26/2015   Procedure: WIDE LOCA  EXCISION OF VULVAR;  Surgeon: [name], MD;  Location: [place];  Service: Gynecology;  Laterality: N/A;  . Wide Local Excision of labia majora  04-04-2010   Right-sided lesion, CO2 ablation of right labia minora    Prior to Admission medications   Medication Sig Start Date End Date Taking? Authorizing Provider  acetaminophen (TYLENOL) 325 MG tablet Take 650 mg by mouth every 6 (six) hours as needed for mild pain.    Yes [provider]  ALPRAZolam (XANAX) 0.5 MG tablet Take 1 tablet (0.5 mg total) by mouth 3 (three) times daily as needed for anxiety. 01/06/20  Yes Georgette Shell, MD  aspirin 81 MG chewable tablet Chew 1 tablet (81 mg total) by mouth daily. 01/07/20  Yes Georgette Shell, MD  atorvastatin (LIPITOR) 80 MG tablet Take 1 tablet (80 mg total) by mouth daily. 01/07/20  Yes Georgette Shell, MD  busPIRone (BUSPAR) 30 MG tablet Take 30 mg by mouth 2 (two) times daily.    Yes [provider]  calcium carbonate (OSCAL) 1500 (600 Ca) MG TABS tablet Take 600 mg of elemental calcium by mouth daily.   Yes [provider]  carvedilol (COREG) 25 MG tablet Take 1 tablet (25 mg total) by mouth 2 (two) times daily with a meal. 02/13/20  Yes Danford, Suann Larry, MD  ferrous sulfate 325 (65 FE) MG tablet Take 325 mg by mouth daily with breakfast.   Yes [provider]  furosemide (LASIX) 40 MG tablet Take 1 tablet (40 mg total) by mouth daily. 01/07/20  Yes Georgette Shell, MD  hydroxychloroquine (PLAQUENIL) 200 MG tablet Take 200 mg by mouth daily. 11/27/19  Yes [provider]  loperamide (IMODIUM) 2 MG capsule Take 1 capsule (2 mg total) by mouth every 6 (six) hours as needed for diarrhea or loose stools. 01/06/20  Yes Georgette Shell, MD  mirtazapine (REMERON) 7.5 MG tablet Take 1 tablet (7.5 mg total) by mouth at bedtime. 01/16/20  Yes Reed, Tiffany L, DO  Multiple Vitamin (MULTIVITAMIN WITH MINERALS) TABS tablet Take 1 tablet by  mouth daily. 01/07/20  Yes Georgette Shell, MD  Polyethyl Glycol-Propyl Glycol (SYSTANE FREE OP) Place 1 drop into both eyes daily as needed (For Dry eyes).   Yes [provider]  predniSONE (DELTASONE) 5 MG tablet Take 5 mg by mouth daily.   Yes [provider]  Rivaroxaban (XARELTO) 15 MG TABS tablet Take 1 tablet (15 mg total) by mouth daily with supper. 02/23/20  Yes Lauree Chandler, NP  saccharomyces boulardii (FLORASTOR) 250 MG capsule Take 1 capsule (250 mg total) by mouth 2 (two) times daily. 01/06/20  Yes Georgette Shell, MD  spironolactone (ALDACTONE) 25 MG tablet Take 0.5 tablets (12.5 mg total) by mouth daily. Patient taking differently: Take 25 mg by mouth daily.  01/07/20  Yes Georgette Shell, MD  denosumab (PROLIA) 60 MG/ML SOSY injection Inject 60 mg into the skin every 6 (six) months.    [provider]    Current Facility-Administered Medications  Medication Dose  Route Frequency Provider Last Rate Last Admin  . nitroGLYCERIN (NITROSTAT) SL tablet 0.4 mg  0.4 mg Sublingual Q5 min PRN Ward, Kristen N, DO      . pantoprazole (PROTONIX) 80 mg in sodium chloride 0.9 % 100 mL (0.8 mg/mL) infusion  8 mg/hr Intravenous Continuous Ward, Kristen N, DO 10 mL/hr at 03/04/20 0501 8 mg/hr at 03/04/20 0501  . [START ON 03/07/2020] pantoprazole (PROTONIX) injection 40 mg  40 mg Intravenous Q12H Ward, Kristen N, DO        Allergies as of 03/04/2020 - Review Complete 03/04/2020  Allergen Reaction Noted  . Latex Swelling and Other (See Comments) 03/30/2012  . Augmentin [amoxicillin-pot clavulanate] Nausea And Vomiting 01/28/2019  . Doxycycline Other (See Comments) 09/22/2013  . Hydrocodone Itching 11/01/2019  . Lactose intolerance (gi) Other (See Comments) 07/15/2012  . Lialda [mesalamine] Diarrhea 10/22/2015  . Prinivil [lisinopril] Diarrhea 07/15/2012  . Triamcinolone acetonide Other (See Comments) 07/15/2012  . Amlodipine Rash 07/15/2012  . Lotensin [benazepril hcl] Rash 07/15/2012    Family History  Problem Relation Age of Onset  . Diabetes Mother   . Hypertension Father   . Heart attack Father 67  . Lymphoma Son   . Cancer Son        lymphoblastic lymphoma    Social History   Socioeconomic History  . Marital status: Widowed    Spouse name: Not on file  . Number of children: Not on file  . Years of education: Not on file  . Highest education level: Not on file  Occupational History  . Not on file  Tobacco Use  . Smoking status: Current Every Day Smoker    Types: Cigarettes  . Smokeless tobacco: Never Used  . Tobacco comment: 2 cigarettes a day  Vaping Use  . Vaping Use: Never used  Substance and Sexual Activity  . Alcohol use: Not Currently    Alcohol/week: 2.0 standard drinks    Types: 2 Cans of beer per week  . Drug use: No  . Sexual activity: Yes  Other Topics Concern  . Not on file  Social History Narrative   Social History       Diet? reg      Do you drink/eat things with caffeine? yes      Marital status?                  Widowed                   What year  were you married? 1993      Do you live in a house, apartment, assisted living, condo, trailer, etc.? House condo      Is it one or more stories? 2 stories       How many persons live in your home? 1      Do you have any pets in your home? (please list) 0      Highest level of education completed?1 year college       Current or past profession: telemarketer      Do you exercise?                           yes           Type & how often?  Walk 2 - 3  Outside/inside  everyday       Advanced Directives      Do you have a living will? yes      Do you have a DNR form?      yes                            If not, do you want to discuss one?       Do you have signed POA/HPOA for forms? No       Functional Status      Do you have difficulty bathing or dressing yourself? yes      Do you have difficulty preparing food or eating? yes      Do you have difficulty managing your medications? yes      Do you have difficulty managing your finances? yes      Do you have difficulty affording your medications? No       Social Determinants of Health   Financial Resource Strain:   . Difficulty of Paying Living Expenses:   Food Insecurity:   . Worried About Charity fundraiser in the Last Year:   . Arboriculturist in the Last Year:   Transportation Needs:   . Film/video editor (Medical):   Marland Kitchen Lack of Transportation (Non-Medical):   Physical Activity:   . Days of Exercise per Week:   . Minutes of Exercise per Session:   Stress:   . Feeling of Stress :   Social Connections:   . Frequency of Communication with Friends and Family:   . Frequency of Social Gatherings with Friends and Family:   . Attends Religious Services:   . Active Member of Clubs or Organizations:   . Attends Archivist Meetings:   Marland Kitchen Marital Status:   Intimate Partner  Violence:   . Fear of Current or Ex-Partner:   . Emotionally Abused:   Marland Kitchen Physically Abused:   . Sexually Abused:     Review of Systems: Positive for: GI: Described in detail in HPI.    Gen: Denies any fever, chills, rigors, night sweats, anorexia, fatigue, weakness, malaise, involuntary weight loss, and sleep disorder CV: chest pain, Denies angina, palpitations, syncope, orthopnea, PND, peripheral edema, and claudication. Resp: Denies dyspnea, cough, sputum, wheezing, coughing up blood. GU : Denies urinary burning, blood in urine, urinary frequency, urinary hesitancy, nocturnal urination, and urinary incontinence. MS: Denies joint pain or swelling.  Denies muscle weakness, cramps, atrophy.  Derm: Denies rash, itching, oral ulcerations, hives, unhealing ulcers.  Psych: Denies depression, anxiety, memory loss, suicidal ideation, hallucinations,  and confusion. Heme:  Denies bruising, bleeding, and enlarged lymph nodes. Neuro:  Denies any headaches, dizziness, paresthesias. Endo:  Denies any problems with DM, thyroid, adrenal function.  Physical Exam: Vital signs in last 24 hours: Temp:  [97.8 F (36.6 C)-97.9 F (36.6 C)] 97.9 F (36.6 C) (07/04 0729) Pulse Rate:  [74-80] 80 (07/04 0729) Resp:  [14-21] 18 (07/04 0729) BP: (159-194)/(82-98) 176/98 (07/04 0729) SpO2:  [100 %] 100 % (07/04 0729) Weight:  [41 kg] 41 kg (07/04 0642) Last BM Date: 03/04/20  General:   Alert,  Well-developed, thinly built, anxious, pleasant and cooperative in NAD, elderly Head:  Normocephalic and atraumatic. Eyes: Mild pallor Ears:  Normal auditory acuity. Nose:  No deformity, discharge,  or lesions. Mouth:  No deformity or lesions.  Oropharynx pink & moist. Neck:  Supple; no masses or thyromegaly. Lungs:  Clear throughout to auscultation.   No wheezes, crackles, or rhonchi. No acute distress. Heart:  Regular rate and rhythm; no murmurs, clicks, rubs,  or gallops. Extremities:  Without clubbing or  edema. Neurologic:  Alert and  oriented x4;  grossly normal neurologically. Skin:  Intact without significant lesions or rashes. Psych:  Alert and cooperative. Normal mood and affect. Abdomen:  Soft, nontender and nondistended. No masses, hepatosplenomegaly or hernias noted. Normal bowel sounds, without guarding, and without rebound.         Lab Results: Recent Labs    03/04/20 0117 03/04/20 1015  WBC 7.5 8.9  HGB 8.7* 10.1*  HCT 26.1* 29.9*  PLT 164 182   BMET Recent Labs    03/04/20 0117  NA 127*  K 4.3  CL 95*  CO2 23  GLUCOSE 128*  BUN 36*  CREATININE 1.58*  CALCIUM 8.7*   LFT Recent Labs    03/04/20 0117  PROT 5.2*  ALBUMIN 2.7*  AST 28  ALT 31  ALKPHOS 39  BILITOT 0.4   PT/INR Recent Labs    03/04/20 0313  LABPROT 20.3*  INR 1.8*    Studies/Results: DG Chest Portable 1 View  Result Date: 03/04/2020 CLINICAL DATA:  Chest pain. EXAM: PORTABLE CHEST 1 VIEW COMPARISON:  02/10/2020. FINDINGS: Cardiomegaly. Unchanged mediastinal contours with aortic atherosclerosis. Resolved interstitial opacities from prior exam. No pleural fluid or pneumothorax. No new airspace disease. There may be trace fluid in the right minor fissure. No acute osseous abnormalities are seen. IMPRESSION: 1. Stable cardiomegaly and aortic atherosclerosis. 2. Resolved interstitial opacities from prior exam. No new findings. Electronically Signed   By: Keith Rake M.D.   On: 03/04/2020 02:25    Impression: Anemia, history of dark stool, FOBT positive stool, on Xarelto, hemodynamically stable Slightly elevated BUN/creatinine ratio of 36/1.58 Patient has been started on IV Protonix drip for suspected upper GI bleeding  Plan: Diagnostic EGD in a.m., clear liquid diet today. I think the primary team needs to discuss with cardiology if patient really needs to continue Xarelto, as she has had multiple admissions with recurrent and symptomatic anemia.    LOS: 0 days   [name], MD   03/04/2020, 10:42 AM

## 2020-03-04 NOTE — H&P (View-Only) (Signed)
[facility] Consult  Referring Provider: [facility] Primary Care Physician:  Gayland Curry, DO Primary Gastroenterologist: Dr.Aviah Sorci/[facility]  Reason for Consultation: Anemia, FOBT positive stool  HPI: Regina Ortiz is a 74 y.o. female was admitted today with complaints of chest pain, midsternal, pressure-like with radiation to the throat.  On admission she was found to have a hemoglobin of 8.7, baseline hemoglobin is 11, repeat hemoglobin is 10.1 without transfusion. Patient states that since discharge she has had a few episodes of dark possibly black stools. She has not noted any fresh blood in stool. She denies nausea, vomiting, abdominal pain. To be noted, she recently had an endoscopy on 02/11/2020 which was unremarkable along with a capsule endoscopy on 02/14/2020 which was unremarkable and her last colonoscopy was in 08/2019 with removal of a few tubular adenomas, diverticulosis, internal hemorrhoids and biopsies negative for microscopic colitis.  Patient is on Xarelto for atrial fibrillation.   Past Medical History:  Diagnosis Date  . AIN grade I   . Anemia   . Anxiety   . Bruises easily   . Chronic diarrhea   . Chronic hyponatremia   . Colitis   . COPD (chronic obstructive pulmonary disease) ([place])   . Depression   . GERD (gastroesophageal reflux disease)   . History of chronic bronchitis   . History of vulvar dysplasia    serveral recurrency's   . Hypertension   . Insomnia   . LBBB (left bundle branch block) W/ PROLONGED PR   CARDIOLOGSIT-  DR Johnsie Cancel-   LOV IN EPIC  . Macrocytosis   . Major depressive disorder   . Nocturia   . Osteoporosis   . Raynaud's disease   . Sciatica    right side  . Seronegative rheumatoid arthritis ([place])   . Short of breath on exertion   . Smokers' cough ([place])   . Venous insufficiency of leg    , Edema  . VIN III (vulvar intraepithelial neoplasia III)    RECURRENT -  . Vitamin B12 deficiency     Past  Surgical History:  Procedure Laterality Date  . APPENDECTOMY  1992  . BIOPSY  08/19/2019   Procedure: BIOPSY;  Surgeon: [name], MD;  Location: [place];  Service: Gastroenterology;;  . CARDIOVASCULAR STRESS TEST  06-23-2012  DR Jenkins [place]   NORMAL LEXISCAN MYOVIEW/ EF 83%/  NO ISCHEMIA  . CO2 LASER APPLICATION  57/26/2035   Procedure: CO2 LASER APPLICATION;  Surgeon: Janie Morning, MD PHD;  Location: [facility];  Service: Gynecology;  Laterality: N/A;  Laser Vaporization  . CO2 LASER APPLICATION N/A 01/08/7415   Procedure: CO2 LASER APPLICATION;  Surgeon: [name], MD;  Location: [facility];  Service: Gynecology;  Laterality: N/A;  . CO2 LASER APPLICATION N/A 3/84/5364   Procedure: CO2 LASER APPLICATION of the vulva;  Surgeon: Janie Morning, MD;  Location: [facility];  Service: Gynecology;  Laterality: N/A;  . COLONOSCOPY WITH PROPOFOL N/A 08/19/2019   Procedure: COLONOSCOPY WITH PROPOFOL;  Surgeon: [name], MD;  Location: [place];  Service: Gastroenterology;  Laterality: N/A;  . ESOPHAGOGASTRODUODENOSCOPY (EGD) WITH PROPOFOL N/A 08/19/2019   Procedure: ESOPHAGOGASTRODUODENOSCOPY (EGD) WITH PROPOFOL;  Surgeon: [name], MD;  Location: [place];  Service: Gastroenterology;  Laterality: N/A;  . ESOPHAGOGASTRODUODENOSCOPY (EGD) WITH PROPOFOL N/A 02/11/2020   Procedure: ESOPHAGOGASTRODUODENOSCOPY (EGD) WITH PROPOFOL;  Surgeon: Ronald Lobo, MD;  Location: [place];  Service: Endoscopy;  Laterality: N/A;  . EXCISION VULVAR LESIONS  07/2012  . GIVENS CAPSULE STUDY N/A 02/12/2020   Procedure: GIVENS CAPSULE STUDY;  Surgeon: Ronald Lobo, MD;  Location: [facility]OSCOPY;  Service: Endoscopy;  Laterality: N/A;  . HEMORRHOID SURGERY N/A 04/15/2013   Procedure: Excision of external and internal  hemorrhoid with AIN, Exam under anesthesia;  Surgeon: Odis Hollingshead, MD;  Location: [facility];  Service: General;  Laterality: N/A;   . HIP PINNING,CANNULATED Right 03/15/2019   Procedure: Maebelle Munroe OF FEMORAL NECK FRACTURE;  Surgeon: Renette Butters, MD;  Location: [place];  Service: Orthopedics;  Laterality: Right;  . POLYPECTOMY  08/19/2019   Procedure: POLYPECTOMY;  Surgeon: [name], MD;  Location: [facility] ENDOSCOPY;  Service: Gastroenterology;;  . REPAIR FISTULA IN ANO/  I & D PERIRECTAL ABSCESS  10-26-2002  DR Johnathan Hausen  . RIGHT/LEFT HEART CATH AND CORONARY ANGIOGRAPHY N/A 01/03/2020   Procedure: RIGHT/LEFT HEART CATH AND CORONARY ANGIOGRAPHY;  Surgeon: Belva Crome, MD;  Location: [place] [facility];  Service: Cardiovascular;  Laterality: N/A;  . TRANSTHORACIC ECHOCARDIOGRAM  06-23-2012   NORMAL LVSF/ EF 55-60%  . VAGINAL HYSTERECTOMY  1997   partial  . VULVAR LESION REMOVAL N/A 11/03/2014   Procedure: CO2 LASER OF VULVAR ;  Surgeon: [name], MD;  Location: [facility];  Service: Gynecology;  Laterality: N/A;  . VULVECTOMY N/A 03/26/2015   Procedure: WIDE LOCA  EXCISION OF VULVAR;  Surgeon: [name], MD;  Location: [place];  Service: Gynecology;  Laterality: N/A;  . Wide Local Excision of labia majora  04-04-2010   Right-sided lesion, CO2 ablation of right labia minora    Prior to Admission medications   Medication Sig Start Date End Date Taking? Authorizing Provider  acetaminophen (TYLENOL) 325 MG tablet Take 650 mg by mouth every 6 (six) hours as needed for mild pain.    Yes [provider]  ALPRAZolam (XANAX) 0.5 MG tablet Take 1 tablet (0.5 mg total) by mouth 3 (three) times daily as needed for anxiety. 01/06/20  Yes Georgette Shell, MD  aspirin 81 MG chewable tablet Chew 1 tablet (81 mg total) by mouth daily. 01/07/20  Yes Georgette Shell, MD  atorvastatin (LIPITOR) 80 MG tablet Take 1 tablet (80 mg total) by mouth daily. 01/07/20  Yes Georgette Shell, MD  busPIRone (BUSPAR) 30 MG tablet Take 30 mg by mouth 2 (two) times daily.    Yes [provider]  calcium carbonate (OSCAL) 1500 (600 Ca) MG TABS tablet Take 600 mg of elemental calcium by mouth daily.   Yes [provider]  carvedilol (COREG) 25 MG tablet Take 1 tablet (25 mg total) by mouth 2 (two) times daily with a meal. 02/13/20  Yes Danford, Suann Larry, MD  ferrous sulfate 325 (65 FE) MG tablet Take 325 mg by mouth daily with breakfast.   Yes [provider]  furosemide (LASIX) 40 MG tablet Take 1 tablet (40 mg total) by mouth daily. 01/07/20  Yes Georgette Shell, MD  hydroxychloroquine (PLAQUENIL) 200 MG tablet Take 200 mg by mouth daily. 11/27/19  Yes [provider]  loperamide (IMODIUM) 2 MG capsule Take 1 capsule (2 mg total) by mouth every 6 (six) hours as needed for diarrhea or loose stools. 01/06/20  Yes Georgette Shell, MD  mirtazapine (REMERON) 7.5 MG tablet Take 1 tablet (7.5 mg total) by mouth at bedtime. 01/16/20  Yes Reed, Tiffany L, DO  Multiple Vitamin (MULTIVITAMIN WITH MINERALS) TABS tablet Take 1 tablet by  mouth daily. 01/07/20  Yes Georgette Shell, MD  Polyethyl Glycol-Propyl Glycol (SYSTANE FREE OP) Place 1 drop into both eyes daily as needed (For Dry eyes).   Yes [provider]  predniSONE (DELTASONE) 5 MG tablet Take 5 mg by mouth daily.   Yes [provider]  Rivaroxaban (XARELTO) 15 MG TABS tablet Take 1 tablet (15 mg total) by mouth daily with supper. 02/23/20  Yes Lauree Chandler, NP  saccharomyces boulardii (FLORASTOR) 250 MG capsule Take 1 capsule (250 mg total) by mouth 2 (two) times daily. 01/06/20  Yes Georgette Shell, MD  spironolactone (ALDACTONE) 25 MG tablet Take 0.5 tablets (12.5 mg total) by mouth daily. Patient taking differently: Take 25 mg by mouth daily.  01/07/20  Yes Georgette Shell, MD  denosumab (PROLIA) 60 MG/ML SOSY injection Inject 60 mg into the skin every 6 (six) months.    [provider]    Current Facility-Administered Medications  Medication Dose  Route Frequency Provider Last Rate Last Admin  . nitroGLYCERIN (NITROSTAT) SL tablet 0.4 mg  0.4 mg Sublingual Q5 min PRN Ward, Kristen N, DO      . pantoprazole (PROTONIX) 80 mg in sodium chloride 0.9 % 100 mL (0.8 mg/mL) infusion  8 mg/hr Intravenous Continuous Ward, Kristen N, DO 10 mL/hr at 03/04/20 0501 8 mg/hr at 03/04/20 0501  . [START ON 03/07/2020] pantoprazole (PROTONIX) injection 40 mg  40 mg Intravenous Q12H Ward, Kristen N, DO        Allergies as of 03/04/2020 - Review Complete 03/04/2020  Allergen Reaction Noted  . Latex Swelling and Other (See Comments) 03/30/2012  . Augmentin [amoxicillin-pot clavulanate] Nausea And Vomiting 01/28/2019  . Doxycycline Other (See Comments) 09/22/2013  . Hydrocodone Itching 11/01/2019  . Lactose intolerance (gi) Other (See Comments) 07/15/2012  . Lialda [mesalamine] Diarrhea 10/22/2015  . Prinivil [lisinopril] Diarrhea 07/15/2012  . Triamcinolone acetonide Other (See Comments) 07/15/2012  . Amlodipine Rash 07/15/2012  . Lotensin [benazepril hcl] Rash 07/15/2012    Family History  Problem Relation Age of Onset  . Diabetes Mother   . Hypertension Father   . Heart attack Father 32  . Lymphoma Son   . Cancer Son        lymphoblastic lymphoma    Social History   Socioeconomic History  . Marital status: Widowed    Spouse name: Not on file  . Number of children: Not on file  . Years of education: Not on file  . Highest education level: Not on file  Occupational History  . Not on file  Tobacco Use  . Smoking status: Current Every Day Smoker    Types: Cigarettes  . Smokeless tobacco: Never Used  . Tobacco comment: 2 cigarettes a day  Vaping Use  . Vaping Use: Never used  Substance and Sexual Activity  . Alcohol use: Not Currently    Alcohol/week: 2.0 standard drinks    Types: 2 Cans of beer per week  . Drug use: No  . Sexual activity: Yes  Other Topics Concern  . Not on file  Social History Narrative   Social History       Diet? reg      Do you drink/eat things with caffeine? yes      Marital status?                  Widowed                   What year  were you married? 1993      Do you live in a house, apartment, assisted living, condo, trailer, etc.? House condo      Is it one or more stories? 2 stories       How many persons live in your home? 1      Do you have any pets in your home? (please list) 0      Highest level of education completed?1 year college       Current or past profession: telemarketer      Do you exercise?                           yes           Type & how often?  Walk 2 - 3  Outside/inside  everyday       Advanced Directives      Do you have a living will? yes      Do you have a DNR form?      yes                            If not, do you want to discuss one?       Do you have signed POA/HPOA for forms? No       Functional Status      Do you have difficulty bathing or dressing yourself? yes      Do you have difficulty preparing food or eating? yes      Do you have difficulty managing your medications? yes      Do you have difficulty managing your finances? yes      Do you have difficulty affording your medications? No       Social Determinants of Health   Financial Resource Strain:   . Difficulty of Paying Living Expenses:   Food Insecurity:   . Worried About Charity fundraiser in the Last Year:   . Arboriculturist in the Last Year:   Transportation Needs:   . Film/video editor (Medical):   Marland Kitchen Lack of Transportation (Non-Medical):   Physical Activity:   . Days of Exercise per Week:   . Minutes of Exercise per Session:   Stress:   . Feeling of Stress :   Social Connections:   . Frequency of Communication with Friends and Family:   . Frequency of Social Gatherings with Friends and Family:   . Attends Religious Services:   . Active Member of Clubs or Organizations:   . Attends Archivist Meetings:   Marland Kitchen Marital Status:   Intimate Partner  Violence:   . Fear of Current or Ex-Partner:   . Emotionally Abused:   Marland Kitchen Physically Abused:   . Sexually Abused:     Review of Systems: Positive for: GI: Described in detail in HPI.    Gen: Denies any fever, chills, rigors, night sweats, anorexia, fatigue, weakness, malaise, involuntary weight loss, and sleep disorder CV: chest pain, Denies angina, palpitations, syncope, orthopnea, PND, peripheral edema, and claudication. Resp: Denies dyspnea, cough, sputum, wheezing, coughing up blood. GU : Denies urinary burning, blood in urine, urinary frequency, urinary hesitancy, nocturnal urination, and urinary incontinence. MS: Denies joint pain or swelling.  Denies muscle weakness, cramps, atrophy.  Derm: Denies rash, itching, oral ulcerations, hives, unhealing ulcers.  Psych: Denies depression, anxiety, memory loss, suicidal ideation, hallucinations,  and confusion. Heme:  Denies bruising, bleeding, and enlarged lymph nodes. Neuro:  Denies any headaches, dizziness, paresthesias. Endo:  Denies any problems with DM, thyroid, adrenal function.  Physical Exam: Vital signs in last 24 hours: Temp:  [97.8 F (36.6 C)-97.9 F (36.6 C)] 97.9 F (36.6 C) (07/04 0729) Pulse Rate:  [74-80] 80 (07/04 0729) Resp:  [14-21] 18 (07/04 0729) BP: (159-194)/(82-98) 176/98 (07/04 0729) SpO2:  [100 %] 100 % (07/04 0729) Weight:  [41 kg] 41 kg (07/04 0642) Last BM Date: 03/04/20  General:   Alert,  Well-developed, thinly built, anxious, pleasant and cooperative in NAD, elderly Head:  Normocephalic and atraumatic. Eyes: Mild pallor Ears:  Normal auditory acuity. Nose:  No deformity, discharge,  or lesions. Mouth:  No deformity or lesions.  Oropharynx pink & moist. Neck:  Supple; no masses or thyromegaly. Lungs:  Clear throughout to auscultation.   No wheezes, crackles, or rhonchi. No acute distress. Heart:  Regular rate and rhythm; no murmurs, clicks, rubs,  or gallops. Extremities:  Without clubbing or  edema. Neurologic:  Alert and  oriented x4;  grossly normal neurologically. Skin:  Intact without significant lesions or rashes. Psych:  Alert and cooperative. Normal mood and affect. Abdomen:  Soft, nontender and nondistended. No masses, hepatosplenomegaly or hernias noted. Normal bowel sounds, without guarding, and without rebound.         Lab Results: Recent Labs    03/04/20 0117 03/04/20 1015  WBC 7.5 8.9  HGB 8.7* 10.1*  HCT 26.1* 29.9*  PLT 164 182   BMET Recent Labs    03/04/20 0117  NA 127*  K 4.3  CL 95*  CO2 23  GLUCOSE 128*  BUN 36*  CREATININE 1.58*  CALCIUM 8.7*   LFT Recent Labs    03/04/20 0117  PROT 5.2*  ALBUMIN 2.7*  AST 28  ALT 31  ALKPHOS 39  BILITOT 0.4   PT/INR Recent Labs    03/04/20 0313  LABPROT 20.3*  INR 1.8*    Studies/Results: DG Chest Portable 1 View  Result Date: 03/04/2020 CLINICAL DATA:  Chest pain. EXAM: PORTABLE CHEST 1 VIEW COMPARISON:  02/10/2020. FINDINGS: Cardiomegaly. Unchanged mediastinal contours with aortic atherosclerosis. Resolved interstitial opacities from prior exam. No pleural fluid or pneumothorax. No new airspace disease. There may be trace fluid in the right minor fissure. No acute osseous abnormalities are seen. IMPRESSION: 1. Stable cardiomegaly and aortic atherosclerosis. 2. Resolved interstitial opacities from prior exam. No new findings. Electronically Signed   By: Keith Rake M.D.   On: 03/04/2020 02:25    Impression: Anemia, history of dark stool, FOBT positive stool, on Xarelto, hemodynamically stable Slightly elevated BUN/creatinine ratio of 36/1.58 Patient has been started on IV Protonix drip for suspected upper GI bleeding  Plan: Diagnostic EGD in a.m., clear liquid diet today. I think the primary team needs to discuss with cardiology if patient really needs to continue Xarelto, as she has had multiple admissions with recurrent and symptomatic anemia.    LOS: 0 days   [name], MD   03/04/2020, 10:42 AM

## 2020-03-04 NOTE — Progress Notes (Signed)
PROGRESS NOTE    Regina Ortiz  IWL:[id number] DOB: 1945/11/08 DOA: 03/04/2020 PCP: Gayland Curry, DO   No chief complaint on file.  Brief Narrative:  Regina Ortiz is Regina Ortiz 74 y.o. female with medical history significant for Regina Ortiz. fib on Xarelto, hypertension, sCHF (EF 30-35%), COPD (not on home oxygen), rheumatoid arthritis on Plaquenil who presents to the emergency department due to chest pain that started few hours prior to arrival at the ED.  Chest pain was midsternal and was described as pressure-like, it was rated as 6-7/10 on pain scale and with radiation towards the throat.  She was recently admitted on 6/10 and discharged on 6/14 due to acute GI bleed.  Patient states that she called her neighbor who suggested that she should activate EMS so that she could be taken to the ED for further evaluation and management.  Patient feels that her legs were swollen, but denies fever, chills, headache, nausea or vomiting.  ED Course:  In the emergency department, BP was elevated at 194/93, but other vital signs are within normal range.  Work-up in the ED shows normocytic anemia, hyponatremia, hypoalbuminemia, BUN to creatinine 36/1.58 (baseline creatinine1.26-1.46), proBNP 1072.2, troponin 24>29. fecal occult blood was positive.  Chest x-ray showed stable cardiomegaly and aortic atherosclerosis.  IV Lasix 40 mg x 1 was given, she was started on IV Protonix drip.  Hospitalist was asked to admit.  For further evaluation and management.  Assessment & Plan:   Principal Problem:   Chest pain Active Problems:   Hyponatremia   Acute on chronic kidney failure (HCC)   Hypertension   LBBB (left bundle branch block)   Depression   Hypertensive urgency   Prolonged QT interval   COPD (chronic obstructive pulmonary disease) (HCC)   Elevated troponin   GI bleeding   Atrial fibrillation, chronic (HCC)  Chest pain  Coronary Artery Disease:  CP resolved, troponin mildly elevated, relatively  flat Recent cath 12/2019 with heavy L main and proximal LAD calcification, mod to mod severe proximal LAD diagonal bifurcation stenosis up to 70%, mid anterior wall hypokinesis greater than other walls related to ischemia from LAD vs dyssynchrony associated with LBBB  Appreciate cardiology recommendations  Continue statin, holding aspirin at this time with GI bleed  Acute GI bleed  Acute Blood Loss Anemia FOBT was positive Holding xarelto and aspirin at this time - xarelto will not be resumed GI consult, appreciate recommendations, planning for EGD in AM Previous workup with EGD and capsule endoscopy in 01/2020 were unrevealing  Continue PPI gtt  Hyponatremia Continue to monitor, appears relatively euvolemic Fluid restriction  Hypertensive urgency hypertension Follow with BP meds, coreg, spironolactone, imdur  Systolic CHF LVEF 17-40%, proBNP 1072.2 Appears relatively euvolemic at this point Restart coreg, aldactone.  Continue imdur.  Hold hydralazine - concern for hydralazine induced vasculitis on last d/c summary - pt was supposed to discuss this with outpatient providers. Cardiology c/s, appreciate recommendations  COPD not in acute exacerbation Continue to monitor  Acute on chronic kidney disease Creatinine on admission=1.58, GFR 32   baseline creatinine = 1.07-1.58 Continue to monitor  Renally adjust medications, avoid nephrotoxic agents/dehydration/hypotension  Hyperlipidemia Statin   Regina Ortiz. fib rate controlled Hold Xarelto at this time - will likely not resume this Continue telemetry  Ecchymoses: diffuse bruising, follow with holding xarelto.  Platelets normal.  INR elevated, likely related to doac.  She notes this is distressing to her with how it looks/she notes that she's embarrassed by it, continue  to follow and support outpatient.  DVT prophylaxis: xarelto  Code Status: full  Family Communication: none at bedside Disposition:   Status is:  Observation  The patient remains OBS appropriate and will d/c before 2 midnights.  Dispo: The patient is from: Home              Anticipated d/c is to: pending              Anticipated d/c date is: 1 day              Patient currently is not medically stable to d/c.  Consultants:   Cardiology  GI  Procedures:   none  Antimicrobials:  Anti-infectives (From admission, onward)   None     Subjective: Denies CP No bloody or black stool   Objective: Vitals:   03/04/20 0621 03/04/20 0642 03/04/20 0729 03/04/20 1117  BP:  (!) 170/82 (!) 176/98 (!) 173/98  Pulse:  79 80 77  Resp:  20 18 18   Temp: 97.9 F (36.6 C) 97.9 F (36.6 C) 97.9 F (36.6 C) 97.9 F (36.6 C)  TempSrc: Oral Oral Oral Oral  SpO2:  100% 100% 100%  Weight:  41 kg    Height:  5\' 4"  (1.626 m)      Intake/Output Summary (Last 24 hours) at 03/04/2020 1511 Last data filed at 03/04/2020 1159 Gross per 24 hour  Intake 165 ml  Output 451 ml  Net -286 ml   Filed Weights   03/04/20 0642  Weight: 41 kg    Examination:  General exam: Appears calm and comfortable  Respiratory system: Clear to auscultation. Respiratory effort normal. Cardiovascular system: S1 & S2 heard, RRR. Gastrointestinal system: Abdomen is nondistended, soft and nontender. Central nervous system: Alert and oriented. No focal neurological deficits. Extremities: no LEE Skin: diffuse ecchymoses  Psychiatry: Judgement and insight appear normal. Mood & affect appropriate.     Data Reviewed: I have personally reviewed following labs and imaging studies  CBC: Recent Labs  Lab 03/04/20 0117 03/04/20 1015  WBC 7.5 8.9  NEUTROABS 4.8  --   HGB 8.7* 10.1*  HCT 26.1* 29.9*  MCV 94.2 93.1  PLT 164 540    Basic Metabolic Panel: Recent Labs  Lab 03/04/20 0117 03/04/20 1015  NA 127* 131*  K 4.3 4.5  CL 95* 95*  CO2 23 26  GLUCOSE 128* 108*  BUN 36* 34*  CREATININE 1.58* 1.71*  CALCIUM 8.7* 9.1    GFR: Estimated  Creatinine Clearance: 18.4 mL/min (Regina Ortiz) (by C-G formula based on SCr of 1.71 mg/dL (H)).  Liver Function Tests: Recent Labs  Lab 03/04/20 0117 03/04/20 1015  AST 28 29  ALT 31 33  ALKPHOS 39 41  BILITOT 0.4 0.6  PROT 5.2* 6.0*  ALBUMIN 2.7* 3.1*    CBG: No results for input(s): GLUCAP in the last 168 hours.   Recent Results (from the past 240 hour(s))  SARS Coronavirus 2 by RT PCR (hospital order, performed in [facility] hospital lab) Nasopharyngeal Nasopharyngeal Swab     Status: None   Collection Time: 03/04/20  4:31 AM   Specimen: Nasopharyngeal Swab  Result Value Ref Range Status   SARS Coronavirus 2 NEGATIVE NEGATIVE Final    Comment: (NOTE) SARS-CoV-2 target nucleic acids are NOT DETECTED.  The SARS-CoV-2 RNA is generally detectable in upper and lower respiratory specimens during the acute phase of infection. The lowest concentration of SARS-CoV-2 viral copies this assay can detect is 250 copies / mL.  Diedre Maclellan negative result does not preclude SARS-CoV-2 infection and should not be used as the sole basis for treatment or other patient management decisions.  Andreina Outten negative result may occur with improper specimen collection / handling, submission of specimen other than nasopharyngeal swab, presence of viral mutation(s) within the areas targeted by this assay, and inadequate number of viral copies (<250 copies / mL). Thi Klich negative result must be combined with clinical observations, patient history, and epidemiological information.  Fact Sheet for Patients:   StrictlyIdeas.no  Fact Sheet for Healthcare Providers: BankingDealers.co.za  This test is not yet approved or  cleared by the [place] FDA and has been authorized for detection and/or diagnosis of SARS-CoV-2 by FDA under an Emergency Use Authorization (EUA).  This EUA will remain in effect (meaning this test can be used) for the duration of the COVID-19 declaration under  Section 564(b)(1) of the Act, 21 U.S.C. section 360bbb-3(b)(1), unless the authorization is terminated or revoked sooner.  Performed at [facility], [place] [address], [place], [place] [address]          Radiology Studies: DG Chest Portable 1 View  Result Date: 03/04/2020 CLINICAL DATA:  Chest pain. EXAM: PORTABLE CHEST 1 VIEW COMPARISON:  02/10/2020. FINDINGS: Cardiomegaly. Unchanged mediastinal contours with aortic atherosclerosis. Resolved interstitial opacities from prior exam. No pleural fluid or pneumothorax. No new airspace disease. There may be trace fluid in the right minor fissure. No acute osseous abnormalities are seen. IMPRESSION: 1. Stable cardiomegaly and aortic atherosclerosis. 2. Resolved interstitial opacities from prior exam. No new findings. Electronically Signed   By: Keith Rake M.D.   On: 03/04/2020 02:25        Scheduled Meds: . carvedilol  12.5 mg Oral BID WC  . hydrALAZINE  20 mg Oral Q8H  . isosorbide mononitrate  15 mg Oral Daily  . [START ON 03/07/2020] pantoprazole  40 mg Intravenous Q12H  . spironolactone  12.5 mg Oral Daily   Continuous Infusions: . sodium chloride 20 mL/hr at 03/04/20 1406  . pantoprozole (PROTONIX) infusion 8 mg/hr (03/04/20 0501)     LOS: 0 days    Time spent: over 30 min    Fayrene Helper, MD [facility]   To contact the attending provider between 7A-7P or the covering provider during after hours 7P-7A, please log into the web site [email or web address] and access using universal [place] password for that web site. If you do not have the password, please call the hospital operator.  03/04/2020, 3:11 PM

## 2020-03-04 NOTE — H&P (Signed)
History and Physical  [name] UKG:[id number] DOB: [birth year] DOA: 03/04/2020  Referring physician: Pryor Curia, MD PCP: Regina Curry, DO  Patient coming from: Home  Chief Complaint: Chest pain and shortness of breath  HPI: Regina Ortiz is a 74 y.o. female with medical history significant for A. fib on Xarelto, hypertension, sCHF (EF 30-35%), COPD (not on home oxygen), rheumatoid arthritis on Plaquenil who presents to the emergency department due to chest pain that started few hours prior to arrival at the ED.  Chest pain was midsternal and was described as pressure-like, it was rated as 6-7/10 on pain scale and with radiation towards the throat.  She was recently admitted on 6/10 and discharged on 6/14 due to acute GI bleed.  Patient states that she called her neighbor who suggested that she should activate EMS so that she could be taken to the ED for further evaluation and management.  Patient feels that her legs were swollen, but denies fever, chills, headache, nausea or vomiting.  ED Course:  In the emergency department, BP was elevated at 194/93, but other vital signs are within normal range.  Work-up in the ED shows normocytic anemia, hyponatremia, hypoalbuminemia, BUN to creatinine 36/1.58 (baseline creatinine1.26-1.46), proBNP 1072.2, troponin 24>29. fecal occult blood was positive.  Chest x-ray showed stable cardiomegaly and aortic atherosclerosis.  IV Lasix 40 mg x 1 was given, she was started on IV Protonix drip.  Hospitalist was asked to admit.  For further evaluation and management.  Review of Systems: Constitutional: Negative for chills and fever.  HENT: Negative for ear pain and sore throat.   Eyes: Negative for pain and visual disturbance.  Respiratory: Shortness of breath.  Negative for cough, chest tightness Cardiovascular: Positive for chest pain.  Negative for palpitations.  Gastrointestinal: Negative for abdominal pain and vomiting.  Endocrine: Negative  for polyphagia and polyuria.  Genitourinary: Negative for decreased urine volume, dysuria, enuresis, hematuria, vaginal discharge and vaginal pain.  Musculoskeletal: Negative for arthralgias and back pain.  Skin: Negative for color change and rash.  Allergic/Immunologic: Negative for immunocompromised state.  Neurological: Negative for tremors, syncope, speech difficulty, weakness, light-headedness and headaches.  Hematological: Does not bruise/bleed easily.  All other systems reviewed and are negative   Past Medical History:  Diagnosis Date  . AIN grade I   . Anemia   . Anxiety   . Bruises easily   . Chronic diarrhea   . Chronic hyponatremia   . Colitis   . COPD (chronic obstructive pulmonary disease) ([place])   . Depression   . GERD (gastroesophageal reflux disease)   . History of chronic bronchitis   . History of vulvar dysplasia    serveral recurrency's   . Hypertension   . Insomnia   . LBBB (left bundle branch block) W/ PROLONGED PR   CARDIOLOGSIT-  DR Regina Ortiz-   LOV IN EPIC  . Macrocytosis   . Major depressive disorder   . Nocturia   . Osteoporosis   . Raynaud's disease   . Sciatica    right side  . Seronegative rheumatoid arthritis ([place])   . Short of breath on exertion   . Smokers' cough ([place])   . Venous insufficiency of leg    , Edema  . VIN III (vulvar intraepithelial neoplasia III)    RECURRENT -  . Vitamin B12 deficiency    Past Surgical History:  Procedure Laterality Date  . APPENDECTOMY  1992  . BIOPSY  08/19/2019   Procedure: BIOPSY;  Surgeon:  Regina Juniper, MD;  Location: [facility] ENDOSCOPY;  Service: Gastroenterology;;  . CARDIOVASCULAR STRESS TEST  06-23-2012  DR Jenkins [place]   NORMAL LEXISCAN MYOVIEW/ EF 83%/  NO ISCHEMIA  . CO2 LASER APPLICATION  16/06/9603   Procedure: CO2 LASER APPLICATION;  Surgeon: Regina Morning, MD PHD;  Location: [facility];  Service: Gynecology;  Laterality: N/A;  Laser Vaporization  . CO2 LASER APPLICATION N/A  01/03/980   Procedure: CO2 LASER APPLICATION;  Surgeon: Regina Amber, MD;  Location: [facility];  Service: Gynecology;  Laterality: N/A;  . CO2 LASER APPLICATION N/A 1/91/4782   Procedure: CO2 LASER APPLICATION of the vulva;  Surgeon: Regina Morning, MD;  Location: [facility];  Service: Gynecology;  Laterality: N/A;  . COLONOSCOPY WITH PROPOFOL N/A 08/19/2019   Procedure: COLONOSCOPY WITH PROPOFOL;  Surgeon: Regina Juniper, MD;  Location: [place];  Service: Gastroenterology;  Laterality: N/A;  . ESOPHAGOGASTRODUODENOSCOPY (EGD) WITH PROPOFOL N/A 08/19/2019   Procedure: ESOPHAGOGASTRODUODENOSCOPY (EGD) WITH PROPOFOL;  Surgeon: Regina Juniper, MD;  Location: [place];  Service: Gastroenterology;  Laterality: N/A;  . ESOPHAGOGASTRODUODENOSCOPY (EGD) WITH PROPOFOL N/A 02/11/2020   Procedure: ESOPHAGOGASTRODUODENOSCOPY (EGD) WITH PROPOFOL;  Surgeon: Regina Lobo, MD;  Location: [place];  Service: Endoscopy;  Laterality: N/A;  . EXCISION VULVAR LESIONS  07/2012  . GIVENS CAPSULE STUDY N/A 02/12/2020   Procedure: GIVENS CAPSULE STUDY;  Surgeon: Regina Lobo, MD;  Location: [facility] ENDOSCOPY;  Service: Endoscopy;  Laterality: N/A;  . HEMORRHOID SURGERY N/A 04/15/2013   Procedure: Excision of external and internal  hemorrhoid with AIN, Exam under anesthesia;  Surgeon: Regina Hollingshead, MD;  Location: [facility];  Service: General;  Laterality: N/A;  . HIP PINNING,CANNULATED Right 03/15/2019   Procedure: Regina Ortiz OF FEMORAL NECK FRACTURE;  Surgeon: Regina Butters, MD;  Location: [place];  Service: Orthopedics;  Laterality: Right;  . POLYPECTOMY  08/19/2019   Procedure: POLYPECTOMY;  Surgeon: Regina Juniper, MD;  Location: [facility] ENDOSCOPY;  Service: Gastroenterology;;  . REPAIR FISTULA IN ANO/  I & D PERIRECTAL ABSCESS  10-26-2002  DR Regina Ortiz  . RIGHT/LEFT HEART CATH AND CORONARY ANGIOGRAPHY N/A 01/03/2020   Procedure: RIGHT/LEFT HEART CATH AND CORONARY ANGIOGRAPHY;  Surgeon:  Regina Crome, MD;  Location: [place] [facility];  Service: Cardiovascular;  Laterality: N/A;  . TRANSTHORACIC ECHOCARDIOGRAM  06-23-2012   NORMAL LVSF/ EF 55-60%  . VAGINAL HYSTERECTOMY  1997   partial  . VULVAR LESION REMOVAL N/A 11/03/2014   Procedure: CO2 LASER OF VULVAR ;  Surgeon: Regina Amber, MD;  Location: [facility];  Service: Gynecology;  Laterality: N/A;  . VULVECTOMY N/A 03/26/2015   Procedure: WIDE LOCA  EXCISION OF VULVAR;  Surgeon: Regina Amber, MD;  Location: [place];  Service: Gynecology;  Laterality: N/A;  . Wide Local Excision of labia majora  04-04-2010   Right-sided lesion, CO2 ablation of right labia minora    Social History:  reports that she has been smoking cigarettes. She has never used smokeless tobacco. She reports previous alcohol use of about 2.0 standard drinks of alcohol per week. She reports that she does not use drugs.   Allergies  Allergen Reactions  . Latex Swelling and Other (See Comments)     fever blisters  . Augmentin [Amoxicillin-Pot Clavulanate] Nausea And Vomiting  . Doxycycline Other (See Comments)    Blurred vision, nervous, unsteady  . Hydrocodone Itching    Acetaminophen:itching, confusion, hallucinations-side effects-Onset date 07/15/2018  . Lactose Intolerance (Gi)  Other (See Comments)    Unknown, per pt  . Lialda [Mesalamine] Diarrhea  . Prinivil [Lisinopril] Diarrhea  . Triamcinolone Acetonide Other (See Comments)    REDNESS AND PAIN  . Amlodipine Rash  . Lotensin [Benazepril Hcl] Rash    Family History  Problem Relation Age of Onset  . Diabetes Mother   . Hypertension Father   . Heart attack Father 62  . Lymphoma Son   . Cancer Son        lymphoblastic lymphoma     Prior to Admission medications   Medication Sig Start Date End Date Taking? Authorizing Provider  acetaminophen (TYLENOL) 325 MG tablet Take 650 mg by mouth every 6 (six) hours as needed for mild pain.     [provider]  ALPRAZolam Duanne Moron) 0.5 MG tablet Take 1 tablet (0.5 mg total) by mouth 3 (three) times daily as needed for anxiety. 01/06/20   Georgette Shell, MD  aspirin 81 MG chewable tablet Chew 1 tablet (81 mg total) by mouth daily. 01/07/20   Georgette Shell, MD  atorvastatin (LIPITOR) 80 MG tablet Take 1 tablet (80 mg total) by mouth daily. 01/07/20   Georgette Shell, MD  busPIRone (BUSPAR) 30 MG tablet Take 30 mg by mouth 2 (two) times daily.     [provider]  calcium carbonate (OSCAL) 1500 (600 Ca) MG TABS tablet Take 600 mg of elemental calcium by mouth daily.    [provider]  carvedilol (COREG) 25 MG tablet Take 1 tablet (25 mg total) by mouth 2 (two) times daily with a meal. 02/13/20   Danford, Suann Larry, MD  denosumab (PROLIA) 60 MG/ML SOSY injection Inject 60 mg into the skin every 6 (six) months.    [provider]  ferrous sulfate 325 (65 FE) MG tablet Take 325 mg by mouth daily with breakfast.    [provider]  furosemide (LASIX) 40 MG tablet Take 1 tablet (40 mg total) by mouth daily. 01/07/20   Georgette Shell, MD  hydroxychloroquine (PLAQUENIL) 200 MG tablet Take 200 mg by mouth daily. 11/27/19   [provider]  loperamide (IMODIUM) 2 MG capsule Take 1 capsule (2 mg total) by mouth every 6 (six) hours as needed for diarrhea or loose stools. 01/06/20   Georgette Shell, MD  mirtazapine (REMERON) 7.5 MG tablet Take 1 tablet (7.5 mg total) by mouth at bedtime. 01/16/20   Reed, Tiffany L, DO  Multiple Vitamin (MULTIVITAMIN WITH MINERALS) TABS tablet Take 1 tablet by mouth daily. 01/07/20   Georgette Shell, MD  Polyethyl Glycol-Propyl Glycol (SYSTANE FREE OP) Place 1 drop into both eyes daily as needed (For Dry eyes).    [provider]  polyethylene glycol (MIRALAX / GLYCOLAX) 17 g packet Take 17 g by mouth daily as needed for mild constipation.     [provider]  predniSONE (DELTASONE) 5 MG tablet Take 5  mg by mouth daily.    [provider]  Rivaroxaban (XARELTO) 15 MG TABS tablet Take 1 tablet (15 mg total) by mouth daily with supper. 02/23/20   Lauree Chandler, NP  saccharomyces boulardii (FLORASTOR) 250 MG capsule Take 1 capsule (250 mg total) by mouth 2 (two) times daily. 01/06/20   Georgette Shell, MD  spironolactone (ALDACTONE) 25 MG tablet Take 0.5 tablets (12.5 mg total) by mouth daily. 01/07/20   Georgette Shell, MD    Physical Exam: BP (!) 159/98   Pulse 79  Temp 97.8 F (36.6 C) (Oral)   Resp (!) 21   LMP  (LMP Unknown)   SpO2 100%   . General: 75 y.o. year-old female well developed well nourished in no acute distress.  Alert and oriented x3. Marland Kitchen HEENT: NCAT, EOMI, PERRLA . Neck: Supple, trachea midline . Cardiovascular: Regular rate and rhythm with no rubs or gallops.  No thyromegaly or JVD noted.  No lower extremity edema. 2/4 pulses in all 4 extremities. Marland Kitchen Respiratory: Clear to auscultation with no wheezes or rales. Good inspiratory effort. . Abdomen: Soft nontender nondistended with normal bowel sounds x4 quadrants. . Muskuloskeletal: No cyanosis, clubbing or edema noted bilaterally . Neuro: CN II-XII intact, strength, sensation, reflexes . Skin: No ulcerative lesions noted or rashes . Psychiatry: Judgement and insight appear normal. Mood is appropriate for condition and setting          Labs on Admission:  Basic Metabolic Panel: Recent Labs  Lab 03/04/20 0117  NA 127*  K 4.3  CL 95*  CO2 23  GLUCOSE 128*  BUN 36*  CREATININE 1.58*  CALCIUM 8.7*   Liver Function Tests: Recent Labs  Lab 03/04/20 0117  AST 28  ALT 31  ALKPHOS 39  BILITOT 0.4  PROT 5.2*  ALBUMIN 2.7*   No results for input(s): LIPASE, AMYLASE in the last 168 hours. No results for input(s): AMMONIA in the last 168 hours. CBC: Recent Labs  Lab 03/04/20 0117  WBC 7.5  NEUTROABS 4.8  HGB 8.7*  HCT 26.1*  MCV 94.2  PLT 164   Cardiac Enzymes: No results for  input(s): CKTOTAL, CKMB, CKMBINDEX, TROPONINI in the last 168 hours.  BNP (last 3 results) Recent Labs    12/31/19 0432 02/09/20 1317 03/04/20 0117  BNP 3,381.8* 786.6* 1,072.2*    ProBNP (last 3 results) No results for input(s): PROBNP in the last 8760 hours.  CBG: No results for input(s): GLUCAP in the last 168 hours.  Radiological Exams on Admission: DG Chest Portable 1 View  Result Date: 03/04/2020 CLINICAL DATA:  Chest pain. EXAM: PORTABLE CHEST 1 VIEW COMPARISON:  02/10/2020. FINDINGS: Cardiomegaly. Unchanged mediastinal contours with aortic atherosclerosis. Resolved interstitial opacities from prior exam. No pleural fluid or pneumothorax. No new airspace disease. There may be trace fluid in the right minor fissure. No acute osseous abnormalities are seen. IMPRESSION: 1. Stable cardiomegaly and aortic atherosclerosis. 2. Resolved interstitial opacities from prior exam. No new findings. Electronically Signed   By: Keith Rake M.D.   On: 03/04/2020 02:25    EKG: I independently viewed the EKG done and my findings are as followed: Sinus rhythm at rate of 77 bpm with LBBB, TWI in inferior leads and prolonged QTc 478ms  Assessment/Plan Present on Admission: . Chest pain . Hyponatremia . Hypertension . LBBB (left bundle branch block) . Hypertensive urgency . COPD (chronic obstructive pulmonary disease) ([place]) . Elevated troponin . GI bleeding . Acute on chronic kidney failure (HCC)  Principal Problem:   Chest pain Active Problems:   Hyponatremia   Acute on chronic kidney failure (HCC)   Hypertension   LBBB (left bundle branch block)   Depression   Hypertensive urgency   Prolonged QT interval   COPD (chronic obstructive pulmonary disease) (HCC)   Elevated troponin   GI bleeding   Atrial fibrillation, chronic (HCC)   Chest pain rule out ACS/NSTEMI Chest pain has since improved Patient will be placed on observation status on  telemetry monitored unit,   Troponins 24>29, continue to trend  troponin EKG showed TWI in inferior leads, repeat EKG  Consider cardiology consult to help decide if Stress test is needed in am Versus other diagnostic modalities.   Continue nitroglycerin IV heparin cannot be started at this time due to patient having GI bleed (FOBT positive)  Elevated troponin Continue management as per above  Cath 01/03/20:   Heavy left main and proximal LAD calcification.  Moderate to moderately severe proximal LAD diagonal bifurcation stenosis up to 70%.  Widely patent circumflex.  Widely patent right coronary.  Mid anterior wall hypokinesis greater than other walls related to ischemia from LAD versus dyssynchrony associated with left bundle branch block.  Estimated LVEF is 35 to 40%. LVEDP is 25 mmHg.  Significant mitral regurgitation by 2D Doppler echocardiogram.  Severe hypertension noted during the procedure.  RECOMMENDATIONS:   Medical therapy of heart failure including additional diuresis.  Preventive therapy including high intensity statin therapy for LAD disease. Downstream management could include PCI with debulking using atherectomy versus combined coronary artery bypass grafting and mitral valve repair depending upon the status of the mitral valve and role in development of acute pulmonary edema.  Once stable, may need transesophageal echocardiography.  Further discussion concerning severity of mitral valve disease on appropriate management.  Reassess kidney function in a.m.  In lab hypertension treated with IV hydralazine. Aggressive management of hypertension is required and may have been the driving precipitant for acute heart failure.   Echo 01/01/20:  IMPRESSIONS    1. Moderate to severe global reduction in LV systolic function; grade 1  diastolic dysfunction; moderate LVH; moderate MR; severe LAE.  2. Left ventricular ejection fraction, by estimation, is 30 to 35%. The  left ventricle  has moderately decreased function. The left ventricle  demonstrates global hypokinesis. There is moderate left ventricular  hypertrophy. Left ventricular diastolic  parameters are consistent with Grade I diastolic dysfunction (impaired  relaxation).  3. Right ventricular systolic function is normal. The right ventricular  size is normal.  4. Left atrial size was severely dilated.  5. The mitral valve is normal in structure. Moderate mitral valve  regurgitation. No evidence of mitral stenosis.  6. The aortic valve is tricuspid. Aortic valve regurgitation is not  visualized. Mild aortic valve sclerosis is present, with no evidence of  aortic valve stenosis.  7. The inferior vena cava is normal in size with greater than 50%  respiratory variability, suggesting right atrial pressure of 3 mmHg.  Acute GI bleed FOBT was positive Patient had recent complete work-up of GI bleed which was negative, however she has since started taking Xarelto.  Xarelto held at this time. Patient was started on IV Protonix drip Hemoglobin at 8.7 at this time, due to monitor and consider blood transfusion if patient becomes symptomatic or hemoglobin drops below 7 Consider GI consult if patient continues to have GI bleed  Hyponatremia (chronic) Sodium 127, this is possibly secondary to diuretic effect Patient continues to be alert and oriented Continue to monitor  Hypertensive urgency hypertension Continue to monitor BMP and manage accordingly  COPD not in acute exacerbation Patient not in any acute distress, O2 sat 97% - 100% on room air Continue to monitor  Acute on chronic kidney disease Creatinine on admission=1.58, GFR 32   baseline creatinine = 107, GFR 51   Renally adjust medications, avoid nephrotoxic agents/dehydration/hypotension  Hyperlipidemia Continue statin when med rec is updated  Systolic CHF LVEF 42-87%, proBNP 1072.2, this was around 786.6 about 3 weeks ago Chest x-ray showed  stable cardiomegaly with no pleural fluid or pneumothorax but showed trace fluid in the right minor fissure.  IV Lasix 40 Mg x1 was given in the ED Continue to monitor and treat accordingly  A. fib rate controlled Hold Xarelto at this time Continue telemetry    DVT prophylaxis: SCds  Code Status: Full code  Family Communication: None at bedside  Disposition Plan:  Patient is from:                        home Anticipated DC to:                   SNF or family members home Anticipated DC date:                24 hours Anticipated DC barriers:           Cessation of GI bleed and cardiac clearance of chest pain   Consults called: None  Admission status: Observation    Bernadette Hoit MD [facility] Pager [phone]  If 7PM-7AM, please contact night-coverage [email or web address] Password [email or web address]  03/04/2020, 6:12 AM

## 2020-03-05 ENCOUNTER — Encounter (HOSPITAL_COMMUNITY)
Admission: EM | Disposition: A | Discharge status: Home Health | Payer: Self-pay | Source: Home / Self Care | Attending: Family Medicine

## 2020-03-05 ENCOUNTER — Encounter (HOSPITAL_COMMUNITY): Payer: Self-pay | Admitting: Internal Medicine

## 2020-03-05 ENCOUNTER — Observation Stay (HOSPITAL_COMMUNITY): Payer: PPO | Admitting: Certified Registered"

## 2020-03-05 DIAGNOSIS — I160 Hypertensive urgency: Secondary | ICD-10-CM | POA: Diagnosis present

## 2020-03-05 DIAGNOSIS — F17210 Nicotine dependence, cigarettes, uncomplicated: Secondary | ICD-10-CM | POA: Diagnosis present

## 2020-03-05 DIAGNOSIS — G4700 Insomnia, unspecified: Secondary | ICD-10-CM | POA: Diagnosis present

## 2020-03-05 DIAGNOSIS — J449 Chronic obstructive pulmonary disease, unspecified: Secondary | ICD-10-CM | POA: Diagnosis present

## 2020-03-05 DIAGNOSIS — N179 Acute kidney failure, unspecified: Secondary | ICD-10-CM | POA: Diagnosis present

## 2020-03-05 DIAGNOSIS — I872 Venous insufficiency (chronic) (peripheral): Secondary | ICD-10-CM | POA: Diagnosis present

## 2020-03-05 DIAGNOSIS — R0789 Other chest pain: Secondary | ICD-10-CM | POA: Diagnosis not present

## 2020-03-05 DIAGNOSIS — D62 Acute posthemorrhagic anemia: Secondary | ICD-10-CM | POA: Diagnosis present

## 2020-03-05 DIAGNOSIS — I447 Left bundle-branch block, unspecified: Secondary | ICD-10-CM | POA: Diagnosis present

## 2020-03-05 DIAGNOSIS — I428 Other cardiomyopathies: Secondary | ICD-10-CM | POA: Diagnosis present

## 2020-03-05 DIAGNOSIS — N1832 Chronic kidney disease, stage 3b: Secondary | ICD-10-CM | POA: Diagnosis present

## 2020-03-05 DIAGNOSIS — N390 Urinary tract infection, site not specified: Secondary | ICD-10-CM | POA: Diagnosis not present

## 2020-03-05 DIAGNOSIS — I130 Hypertensive heart and chronic kidney disease with heart failure and stage 1 through stage 4 chronic kidney disease, or unspecified chronic kidney disease: Secondary | ICD-10-CM | POA: Diagnosis present

## 2020-03-05 DIAGNOSIS — M810 Age-related osteoporosis without current pathological fracture: Secondary | ICD-10-CM | POA: Diagnosis present

## 2020-03-05 DIAGNOSIS — I482 Chronic atrial fibrillation, unspecified: Secondary | ICD-10-CM | POA: Diagnosis present

## 2020-03-05 DIAGNOSIS — K2971 Gastritis, unspecified, with bleeding: Secondary | ICD-10-CM | POA: Diagnosis present

## 2020-03-05 DIAGNOSIS — E871 Hypo-osmolality and hyponatremia: Secondary | ICD-10-CM | POA: Diagnosis present

## 2020-03-05 DIAGNOSIS — F419 Anxiety disorder, unspecified: Secondary | ICD-10-CM | POA: Diagnosis present

## 2020-03-05 DIAGNOSIS — F329 Major depressive disorder, single episode, unspecified: Secondary | ICD-10-CM | POA: Diagnosis present

## 2020-03-05 DIAGNOSIS — L89151 Pressure ulcer of sacral region, stage 1: Secondary | ICD-10-CM | POA: Diagnosis present

## 2020-03-05 DIAGNOSIS — E8809 Other disorders of plasma-protein metabolism, not elsewhere classified: Secondary | ICD-10-CM | POA: Diagnosis present

## 2020-03-05 DIAGNOSIS — Z20822 Contact with and (suspected) exposure to covid-19: Secondary | ICD-10-CM | POA: Diagnosis present

## 2020-03-05 DIAGNOSIS — I5022 Chronic systolic (congestive) heart failure: Secondary | ICD-10-CM | POA: Diagnosis present

## 2020-03-05 DIAGNOSIS — K219 Gastro-esophageal reflux disease without esophagitis: Secondary | ICD-10-CM | POA: Diagnosis present

## 2020-03-05 DIAGNOSIS — I25119 Atherosclerotic heart disease of native coronary artery with unspecified angina pectoris: Secondary | ICD-10-CM | POA: Diagnosis present

## 2020-03-05 HISTORY — PX: ESOPHAGOGASTRODUODENOSCOPY (EGD) WITH PROPOFOL: SHX5813

## 2020-03-05 LAB — OSMOLALITY, URINE: Osmolality, Ur: 333 mOsm/kg (ref 300–900)

## 2020-03-05 LAB — COMPREHENSIVE METABOLIC PANEL
ALT: 28 U/L (ref 0–44)
AST: 24 U/L (ref 15–41)
Albumin: 2.6 g/dL — ABNORMAL LOW (ref 3.5–5.0)
Alkaline Phosphatase: 38 U/L (ref 38–126)
Anion gap: 7 (ref 5–15)
BUN: 25 mg/dL — ABNORMAL HIGH (ref 8–23)
CO2: 23 mmol/L (ref 22–32)
Calcium: 7.9 mg/dL — ABNORMAL LOW (ref 8.9–10.3)
Chloride: 93 mmol/L — ABNORMAL LOW (ref 98–111)
Creatinine, Ser: 1.4 mg/dL — ABNORMAL HIGH (ref 0.44–1.00)
GFR calc Af Amer: 42 mL/min — ABNORMAL LOW (ref >60)
GFR calc non Af Amer: 37 mL/min — ABNORMAL LOW (ref >60)
Glucose, Bld: 111 mg/dL — ABNORMAL HIGH (ref 70–99)
Potassium: 4.4 mmol/L (ref 3.5–5.1)
Sodium: 123 mmol/L — ABNORMAL LOW (ref 135–145)
Total Bilirubin: 0.8 mg/dL (ref 0.3–1.2)
Total Protein: 5.1 g/dL — ABNORMAL LOW (ref 6.5–8.1)

## 2020-03-05 LAB — OSMOLALITY: Osmolality: 265 mOsm/kg — ABNORMAL LOW (ref 275–295)

## 2020-03-05 LAB — CBC
HCT: 28.4 % — ABNORMAL LOW (ref 36.0–46.0)
Hemoglobin: 9.8 g/dL — ABNORMAL LOW (ref 12.0–15.0)
MCH: 32.2 pg (ref 26.0–34.0)
MCHC: 34.5 g/dL (ref 30.0–36.0)
MCV: 93.4 fL (ref 80.0–100.0)
Platelets: 181 10*3/uL (ref 150–400)
RBC: 3.04 MIL/uL — ABNORMAL LOW (ref 3.87–5.11)
RDW: 17.1 % — ABNORMAL HIGH (ref 11.5–15.5)
WBC: 9.1 10*3/uL (ref 4.0–10.5)
nRBC: 0 % (ref 0.0–0.2)

## 2020-03-05 LAB — APTT: aPTT: 34 seconds (ref 24–36)

## 2020-03-05 LAB — SODIUM, URINE, RANDOM: Sodium, Ur: 47 mmol/L

## 2020-03-05 LAB — PROTIME-INR
INR: 1.2 (ref 0.8–1.2)
Prothrombin Time: 15.1 seconds (ref 11.4–15.2)

## 2020-03-05 LAB — SODIUM: Sodium: 125 mmol/L — ABNORMAL LOW (ref 135–145)

## 2020-03-05 SURGERY — ESOPHAGOGASTRODUODENOSCOPY (EGD) WITH PROPOFOL
Anesthesia: Monitor Anesthesia Care

## 2020-03-05 MED ORDER — ADULT MULTIVITAMIN W/MINERALS CH
1.0000 | ORAL_TABLET | Freq: Every day | ORAL | Status: DC
  Administered 2020-03-05 – 2020-03-08 (×4): 1 via ORAL
  Filled 2020-03-05 (×4): qty 1

## 2020-03-05 MED ORDER — LACTATED RINGERS IV SOLN: INTRAVENOUS | Status: DC | PRN

## 2020-03-05 MED ORDER — CALCIUM CARBONATE 1250 (500 CA) MG PO TABS
600.0000 mg | ORAL_TABLET | Freq: Every day | ORAL | Status: DC
  Administered 2020-03-05 – 2020-03-08 (×4): 500 mg via ORAL
  Filled 2020-03-05 (×4): qty 1

## 2020-03-05 MED ORDER — ACETAMINOPHEN 325 MG PO TABS
650.0000 mg | ORAL_TABLET | Freq: Four times a day (QID) | ORAL | Status: DC | PRN
  Administered 2020-03-05 – 2020-03-08 (×2): 650 mg via ORAL
  Filled 2020-03-05 (×2): qty 2

## 2020-03-05 MED ORDER — SACCHAROMYCES BOULARDII 250 MG PO CAPS
250.0000 mg | ORAL_CAPSULE | Freq: Two times a day (BID) | ORAL | Status: DC
  Administered 2020-03-05 – 2020-03-08 (×7): 250 mg via ORAL
  Filled 2020-03-05 (×8): qty 1

## 2020-03-05 MED ORDER — LIDOCAINE 2% (20 MG/ML) 5 ML SYRINGE
INTRAMUSCULAR | Status: DC | PRN
  Administered 2020-03-05: 40 mg via INTRAVENOUS

## 2020-03-05 MED ORDER — FUROSEMIDE 40 MG PO TABS
40.0000 mg | ORAL_TABLET | Freq: Every day | ORAL | Status: DC
  Administered 2020-03-05 – 2020-03-07 (×3): 40 mg via ORAL
  Filled 2020-03-05 (×3): qty 1

## 2020-03-05 MED ORDER — HYDROXYCHLOROQUINE SULFATE 200 MG PO TABS
200.0000 mg | ORAL_TABLET | Freq: Every day | ORAL | Status: DC
  Administered 2020-03-05 – 2020-03-08 (×4): 200 mg via ORAL
  Filled 2020-03-05 (×4): qty 1

## 2020-03-05 MED ORDER — LOPERAMIDE HCL 2 MG PO CAPS
2.0000 mg | ORAL_CAPSULE | Freq: Four times a day (QID) | ORAL | Status: DC | PRN
  Administered 2020-03-06: 2 mg via ORAL
  Filled 2020-03-05: qty 1

## 2020-03-05 MED ORDER — ASPIRIN 81 MG PO CHEW
81.0000 mg | CHEWABLE_TABLET | Freq: Every day | ORAL | Status: DC
  Administered 2020-03-06: 81 mg via ORAL
  Filled 2020-03-05: qty 1

## 2020-03-05 MED ORDER — PROPOFOL 10 MG/ML IV BOLUS
INTRAVENOUS | Status: DC | PRN
  Administered 2020-03-05: 60 mg via INTRAVENOUS

## 2020-03-05 MED ORDER — PANTOPRAZOLE SODIUM 40 MG PO TBEC
40.0000 mg | DELAYED_RELEASE_TABLET | Freq: Every day | ORAL | Status: DC
  Administered 2020-03-05 – 2020-03-08 (×4): 40 mg via ORAL
  Filled 2020-03-05 (×4): qty 1

## 2020-03-05 MED ORDER — DENOSUMAB 60 MG/ML ~~LOC~~ SOSY: 60.0000 mg | PREFILLED_SYRINGE | SUBCUTANEOUS | Status: DC

## 2020-03-05 MED ORDER — LACTATED RINGERS IV SOLN
INTRAVENOUS | Status: DC
  Administered 2020-03-05: 1000 mL via INTRAVENOUS

## 2020-03-05 SURGICAL SUPPLY — 15 items

## 2020-03-05 NOTE — Op Note (Signed)
EGD was performed for anemia, black stools and FOBT positive stools, last use of Xarelto on 03/03/2020.   Findings: Normal proximal, mid and distal esophagus. Widely patent Schatzki's ring. Small hiatal hernia. Mild erythematous mucosa in the antrum no evidence of active bleeding. Normal retroflexion. Normal duodenal bulb. Mild erythema noted in first portion of duodenum with normal-appearing second portion of duodenum.   Recommendations: Resume regular diet. As per cardiology evaluation yesterday okay to take patient off Xarelto due to evidence of recurrent anemia. Patient to follow-up with me in the office in the next 4 to 6 weeks.  Okay to discharge patient.  [name], MD

## 2020-03-05 NOTE — Op Note (Signed)
[facility] Patient Name: Regina Ortiz Procedure Date : 03/05/2020 MRN: [id number] Attending MD: [name] , MD Date of Birth: 10-Jun-1946 CSN: [id number] Age: 74 Admit Type: Inpatient Procedure:                Upper GI endoscopy Indications:              Iron deficiency anemia, Heme positive stool, black                            stools Providers:                [name], MD, Elmer Ramp. Tilden Dome, RN, Laverda Sorenson,                            Technician, Vania Rea, CRNA Referring MD:             [facility] Medicines:                Monitored Anesthesia Care Complications:            No immediate complications. Estimated blood loss:                            None. Estimated Blood Loss:     Estimated blood loss: none. Procedure:                Pre-Anesthesia Assessment:                           - Prior to the procedure, a History and Physical                            was performed, and patient medications and                            allergies were reviewed. The patient's tolerance of                            previous anesthesia was also reviewed. The risks                            and benefits of the procedure and the sedation                            options and risks were discussed with the patient.                            All questions were answered, and informed consent                            was obtained. Prior Anticoagulants: The patient has                            taken Xarelto (rivaroxaban), last dose was 1 day  prior to procedure. ASA Grade Assessment: III - A                            patient with severe systemic disease. After                            reviewing the risks and benefits, the patient was                            deemed in satisfactory condition to undergo the                            procedure.                           After obtaining informed consent, the endoscope was                             passed under direct vision. Throughout the                            procedure, the patient's blood pressure, pulse, and                            oxygen saturations were monitored continuously. The                            [id number]-[id number] ([id number]) Olympus gastroscope was                            introduced through the mouth, and advanced to the                            second part of duodenum. The upper GI endoscopy was                            accomplished without difficulty. The patient                            tolerated the procedure well. Scope In: Scope Out: Findings:      The upper third of the esophagus, middle third of the esophagus and       lower third of the esophagus were normal.      A widely patent Schatzki ring was found at the gastroesophageal junction.      A small hiatal hernia was present.      Localized mild mucosal changes characterized by erythema were found at       the pylorus.      The cardia and gastric fundus were normal on retroflexion.      The duodenal bulb and second portion of the duodenum were normal.      Localized mildly erythematous mucosa was found in the first portion of       the duodenum. Impression:               - Normal upper third of esophagus, middle  third of                            esophagus and lower third of esophagus.                           - Widely patent Schatzki ring.                           - Small hiatal hernia.                           - Erythematous mucosa in the pylorus.                           - Normal duodenal bulb and second portion of the                            duodenum.                           - Erythematous duodenopathy.                           - No specimens collected. Moderate Sedation:      Patient did not receive moderate sedation for this procedure, but       instead received monitored anesthesia care. Recommendation:           - Resume regular diet.                           -  Use Protonix (pantoprazole) 40 mg PO daily.                           - As per recommendations from cardiology, xarelto                            is going to be stopped due to recurrent anemia.                           - Oral iron supplementation. Procedure Code(s):        --- Professional ---                           [phone], Esophagogastroduodenoscopy, flexible,                            transoral; diagnostic, including collection of                            specimen(s) by brushing or washing, when performed                            (separate procedure) Diagnosis Code(s):        --- Professional ---  K22.2, Esophageal obstruction                           K44.9, Diaphragmatic hernia without obstruction or                            gangrene                           K31.89, Other diseases of stomach and duodenum                           D50.9, Iron deficiency anemia, unspecified                           R19.5, Other fecal abnormalities CPT copyright 2019 [employer]. All rights reserved. The codes documented in this report are preliminary and upon coder review may  be revised to meet current compliance requirements. [name], MD 03/05/2020 8:23:25 AM This report has been signed electronically. Number of Addenda: 0

## 2020-03-05 NOTE — Anesthesia Postprocedure Evaluation (Signed)
Anesthesia Post Note  Patient: Regina Ortiz  Procedure(s) Performed: ESOPHAGOGASTRODUODENOSCOPY (EGD) WITH PROPOFOL (N/A )     Patient location during evaluation: PACU Anesthesia Type: MAC Level of consciousness: awake and alert Pain management: pain level controlled Vital Signs Assessment: post-procedure vital signs reviewed and stable Respiratory status: spontaneous breathing, nonlabored ventilation, respiratory function stable and patient connected to nasal cannula oxygen Cardiovascular status: stable and blood pressure returned to baseline Postop Assessment: no apparent nausea or vomiting Anesthetic complications: no   No complications documented.  Last Vitals:  Vitals:   03/05/20 1144 03/05/20 1145  BP: 105/60 105/60  Pulse: 71 75  Resp:  18  Temp: 36.5 C 36.5 C  SpO2:  98%    Last Pain:  Vitals:   03/05/20 1145  TempSrc: Oral  PainSc:                  Tiajuana Amass

## 2020-03-05 NOTE — Anesthesia Preprocedure Evaluation (Signed)
Anesthesia Evaluation  Patient identified by MRN, date of birth, ID band Patient awake    Reviewed: Allergy & Precautions, NPO status , Patient's Chart, lab work & pertinent test results  Airway Mallampati: II  TM Distance: >3 FB Neck ROM: Limited    Dental  (+) Edentulous Upper   Pulmonary COPD, Current Smoker,    breath sounds clear to auscultation       Cardiovascular hypertension, Pt. on medications and Pt. on home beta blockers + CAD and +CHF  + dysrhythmias  Rhythm:Irregular Rate:Normal     Neuro/Psych  Headaches,  Neuromuscular disease    GI/Hepatic GERD  ,(+)     substance abuse  ,   Endo/Other    Renal/GU CRFRenal disease     Musculoskeletal  (+) Arthritis ,   Abdominal   Peds  Hematology  (+) anemia ,   Anesthesia Other Findings   Reproductive/Obstetrics                             Anesthesia Physical  Anesthesia Plan  ASA: III  Anesthesia Plan: MAC   Post-op Pain Management:    Induction: Intravenous  PONV Risk Score and Plan: 1 and Propofol infusion, Ondansetron and Treatment may vary due to age or medical condition  Airway Management Planned: Natural Airway and Nasal Cannula  Additional Equipment:   Intra-op Plan:   Post-operative Plan:   Informed Consent: I have reviewed the patients History and Physical, chart, labs and discussed the procedure including the risks, benefits and alternatives for the proposed anesthesia with the patient or authorized representative who has indicated his/her understanding and acceptance.       Plan Discussed with: CRNA and Anesthesiologist  Anesthesia Plan Comments:         Anesthesia Quick Evaluation

## 2020-03-05 NOTE — Anesthesia Procedure Notes (Signed)
Procedure Name: MAC Date/Time: 03/05/2020 8:06 AM Performed by: Orlie Dakin, CRNA Pre-anesthesia Checklist: Patient identified, Emergency Drugs available, Suction available and Patient being monitored Patient Re-evaluated:Patient Re-evaluated prior to induction Oxygen Delivery Method: Nasal cannula Preoxygenation: Pre-oxygenation with 100% oxygen Induction Type: IV induction Placement Confirmation: positive ETCO2

## 2020-03-05 NOTE — Brief Op Note (Signed)
03/04/2020 - 03/05/2020  8:15 AM  PATIENT:  Regina Ortiz  74 y.o. female  PRE-OPERATIVE DIAGNOSIS:  black stools, anemia, FOBT positive  POST-OPERATIVE DIAGNOSIS:  gastritis, small hiatal hernia, schatzi ring, duodenitits  PROCEDURE:  Procedure(s): ESOPHAGOGASTRODUODENOSCOPY (EGD) WITH PROPOFOL (N/A)  SURGEON:  Surgeon(s) and Role:    [name], MD - Primary  PHYSICIAN ASSISTANT:   ASSISTANTS: Tory Emerald, RN, Marcia Brash  ANESTHESIA:   MAC  EBL:  None  BLOOD ADMINISTERED:none  DRAINS: none   LOCAL MEDICATIONS USED:  NONE  SPECIMEN:  No Specimen  DISPOSITION OF SPECIMEN:  N/A  COUNTS:  YES  TOURNIQUET:  * No tourniquets in log *  DICTATION: .Dragon Dictation  PLAN OF CARE: Admit to inpatient   PATIENT DISPOSITION:  PACU - hemodynamically stable.   Delay start of Pharmacological VTE agent (>24hrs) due to surgical blood loss or risk of bleeding: yes

## 2020-03-05 NOTE — Interval H&P Note (Signed)
History and Physical Interval Note: 74/female with anemia, dark stools, FOBT positive, last dose of xarelto on 03/03/20 for an EGD.  03/05/2020 7:57 AM  Regina Ortiz  has presented today for EGD, with the diagnosis of black stools, anemia, FOBT positive.  The various methods of treatment have been discussed with the patient and family. After consideration of risks, benefits and other options for treatment, the patient has consented to  Procedure(s): ESOPHAGOGASTRODUODENOSCOPY (EGD) WITH PROPOFOL (N/A) as a surgical intervention.  The patient's history has been reviewed, patient examined, no change in status, stable for surgery.  I have reviewed the patient's chart and labs.  Questions were answered to the patient's satisfaction.     [name]

## 2020-03-05 NOTE — Plan of Care (Signed)
  Problem: Activity: Goal: Risk for activity intolerance will decrease Outcome: Progressing   Problem: Coping: Goal: Level of anxiety will decrease Outcome: Progressing   Problem: Safety: Goal: Ability to remain free from injury will improve Outcome: Progressing   

## 2020-03-05 NOTE — Progress Notes (Addendum)
PROGRESS NOTE    Regina Ortiz  UTM:[id number] DOB: 1946-08-20 DOA: 03/04/2020 PCP: Gayland Curry, DO   No chief complaint on file.  Brief Narrative:  Regina Ortiz is Regina Ortiz 74 y.o. female with medical history significant for Regina Ortiz. fib on Xarelto, hypertension, sCHF (EF 30-35%), COPD (not on home oxygen), rheumatoid arthritis on Plaquenil who presents to the emergency department due to chest pain that started few hours prior to arrival at the ED.  Chest pain was midsternal and was described as pressure-like, it was rated as 6-7/10 on pain scale and with radiation towards the throat.  She was recently admitted on 6/10 and discharged on 6/14 due to acute GI bleed.  Patient states that she called her neighbor who suggested that she should activate EMS so that she could be taken to the ED for further evaluation and management.  Patient feels that her legs were swollen, but denies fever, chills, headache, nausea or vomiting.  ED Course:  In the emergency department, BP was elevated at 194/93, but other vital signs are within normal range.  Work-up in the ED shows normocytic anemia, hyponatremia, hypoalbuminemia, BUN to creatinine 36/1.58 (baseline creatinine1.26-1.46), proBNP 1072.2, troponin 24>29. fecal occult blood was positive.  Chest x-ray showed stable cardiomegaly and aortic atherosclerosis.  IV Lasix 40 mg x 1 was given, she was started on IV Protonix drip.  Hospitalist was asked to admit.  For further evaluation and management.  Assessment & Plan:   Principal Problem:   Chest pain Active Problems:   Hyponatremia   Acute on chronic kidney failure (HCC)   Hypertension   LBBB (left bundle branch block)   Depression   Hypertensive urgency   Prolonged QT interval   COPD (chronic obstructive pulmonary disease) (HCC)   Elevated troponin   GI bleeding   Atrial fibrillation, chronic (HCC)  Chest pain   Coronary Artery Disease:  CP resolved, troponin mildly elevated, relatively  flat Recent cath 12/2019 with heavy L main and proximal LAD calcification, mod to mod severe proximal LAD diagonal bifurcation stenosis up to 70%, mid anterior wall hypokinesis greater than other walls related to ischemia from LAD vs dyssynchrony associated with LBBB  Appreciate cardiology recommendations  Continue statin, holding aspirin at this time with GI bleed - per cardiology, continue to hold aspirin due to recurrent bleeding  Acute GI bleed   Acute Blood Loss Anemia FOBT was positive Holding xarelto and aspirin at this time - xarelto will not be resumed GI consult, appreciate recommendations - EGD with mild erythematous mucosa in the antrum no evidence of active bleeding, mild erythema noted in the first portion of the duodenum  Follow up with Dr. Therisa Doyne within 4-6 weeks, discontinue xarelto Previous workup with EGD and capsule endoscopy in 01/2020 were unrevealing  Continue PPI gtt  Hyponatremia Worsened today, appears euvolemic Follow serum osm, urine na, urine osm Continue fluid restriction, continue home lasix  Fluid restriction  Hypertensive urgency hypertension Follow with BP meds, coreg, spironolactone, imdur, lasix   Systolic CHF LVEF 56-81%, proBNP 1072.2 Appears relatively euvolemic at this point Restart coreg, aldactone.  Continue imdur.  Hold hydralazine - concern for hydralazine induced vasculitis on last d/c summary - pt was supposed to discuss this with outpatient providers. Cardiology c/s, appreciate recommendations  COPD not in acute exacerbation Continue to monitor  Acute on chronic kidney disease Creatinine on admission=1.58, GFR 32   baseline creatinine = 1.07-1.58 Continue to monitor, 1.4 today Renally adjust medications, avoid nephrotoxic agents/dehydration/hypotension  Hyperlipidemia Statin   Regina Ortiz. fib rate controlled Hold Xarelto at this time - will likely not resume this Continue telemetry  Ecchymoses: diffuse bruising, follow with holding  xarelto.  Platelets normal.  INR elevated, likely related to doac.  She notes this is distressing to her with how it looks/she notes that she's embarrassed by it, continue to follow and support outpatient.  DVT prophylaxis: xarelto  Code Status: full  Family Communication: none at bedside - called son on cell phone, no answer Disposition:   Status is: inpatient  The patient will require care spanning > 2 midnights and should be moved to inpatient because: worsening hyponatremia requiring w/u and management, pending therapy eval  Dispo: The patient is from: Home              Anticipated d/c is to: pending              Anticipated d/c date is: 1 day              Patient currently is not medically stable to d/c.  Consultants:   Cardiology  GI  Procedures:   none  Antimicrobials:  Anti-infectives (From admission, onward)   Start     Dose/Rate Route Frequency Ordered Stop   03/05/20 1100  hydroxychloroquine (PLAQUENIL) tablet 200 mg     Discontinue     200 mg Oral Daily 03/05/20 1007       Subjective: Feels anxious, no other complaints  Objective: Vitals:   03/05/20 1145 03/05/20 1200 03/05/20 1218 03/05/20 1300  BP: 105/60 (!) 100/55 111/66 (!) 90/54  Pulse: 75 65 75 72  Resp: 18     Temp: 97.7 F (36.5 C)     TempSrc: Oral     SpO2: 98%  98%   Weight:      Height:        Intake/Output Summary (Last 24 hours) at 03/05/2020 1442 Last data filed at 03/05/2020 1300 Gross per 24 hour  Intake 1733.42 ml  Output 1600 ml  Net 133.42 ml   Filed Weights   03/04/20 0642 03/05/20 0443 03/05/20 0728  Weight: 41 kg 41.6 kg 41.6 kg    Examination:  General: No acute distress. Cardiovascular: Heart sounds show Regina Ortiz regular rate, and rhythm. Lungs: Clear to auscultation bilaterally Abdomen: Soft, nontender, nondistended Neurological: Alert and oriented 3. Moves all extremities 4. Cranial nerves II through XII grossly intact. Skin: diffuse ecchymoses  Extremities: No  clubbing or cyanosis. No edema.     Data Reviewed: I have personally reviewed following labs and imaging studies  CBC: Recent Labs  Lab 03/04/20 0117 03/04/20 1015 03/05/20 0158  WBC 7.5 8.9 9.1  NEUTROABS 4.8  --   --   HGB 8.7* 10.1* 9.8*  HCT 26.1* 29.9* 28.4*  MCV 94.2 93.1 93.4  PLT 164 182 481    Basic Metabolic Panel: Recent Labs  Lab 03/04/20 0117 03/04/20 1015 03/05/20 0158 03/05/20 1227  NA 127* 131* 123* 125*  K 4.3 4.5 4.4  --   CL 95* 95* 93*  --   CO2 23 26 23   --   GLUCOSE 128* 108* 111*  --   BUN 36* 34* 25*  --   CREATININE 1.58* 1.71* 1.40*  --   CALCIUM 8.7* 9.1 7.9*  --     GFR: Estimated Creatinine Clearance: 22.8 mL/min (Regina Ortiz) (by C-G formula based on SCr of 1.4 mg/dL (H)).  Liver Function Tests: Recent Labs  Lab 03/04/20 0117 03/04/20 1015 03/05/20 0158  AST 28 29 24   ALT 31 33 28  ALKPHOS 39 41 38  BILITOT 0.4 0.6 0.8  PROT 5.2* 6.0* 5.1*  ALBUMIN 2.7* 3.1* 2.6*    CBG: No results for input(s): GLUCAP in the last 168 hours.   Recent Results (from the past 240 hour(s))  SARS Coronavirus 2 by RT PCR (hospital order, performed in [facility] hospital lab) Nasopharyngeal Nasopharyngeal Swab     Status: None   Collection Time: 03/04/20  4:31 AM   Specimen: Nasopharyngeal Swab  Result Value Ref Range Status   SARS Coronavirus 2 NEGATIVE NEGATIVE Final    Comment: (NOTE) SARS-CoV-2 target nucleic acids are NOT DETECTED.  The SARS-CoV-2 RNA is generally detectable in upper and lower respiratory specimens during the acute phase of infection. The lowest concentration of SARS-CoV-2 viral copies this assay can detect is 250 copies / mL. Regina Ortiz negative result does not preclude SARS-CoV-2 infection and should not be used as the sole basis for treatment or other patient management decisions.  Regina Ortiz negative result may occur with improper specimen collection / handling, submission of specimen other than nasopharyngeal swab, presence of viral  mutation(s) within the areas targeted by this assay, and inadequate number of viral copies (<250 copies / mL). Regina Ortiz negative result must be combined with clinical observations, patient history, and epidemiological information.  Fact Sheet for Patients:   StrictlyIdeas.no  Fact Sheet for Healthcare Providers: BankingDealers.co.za  This test is not yet approved or  cleared by the [place] FDA and has been authorized for detection and/or diagnosis of SARS-CoV-2 by FDA under an Emergency Use Authorization (EUA).  This EUA will remain in effect (meaning this test can be used) for the duration of the COVID-19 declaration under Section 564(b)(1) of the Act, 21 U.S.C. section 360bbb-3(b)(1), unless the authorization is terminated or revoked sooner.  Performed at [facility], [place] [address], [place], [place] [address]          Radiology Studies: DG Chest Portable 1 View  Result Date: 03/04/2020 CLINICAL DATA:  Chest pain. EXAM: PORTABLE CHEST 1 VIEW COMPARISON:  02/10/2020. FINDINGS: Cardiomegaly. Unchanged mediastinal contours with aortic atherosclerosis. Resolved interstitial opacities from prior exam. No pleural fluid or pneumothorax. No new airspace disease. There may be trace fluid in the right minor fissure. No acute osseous abnormalities are seen. IMPRESSION: 1. Stable cardiomegaly and aortic atherosclerosis. 2. Resolved interstitial opacities from prior exam. No new findings. Electronically Signed   By: Keith Rake M.D.   On: 03/04/2020 02:25        Scheduled Meds:  aspirin  81 mg Oral Daily   atorvastatin  80 mg Oral Daily   busPIRone  30 mg Oral BID   calcium carbonate  500 mg of elemental calcium Oral Daily   carvedilol  12.5 mg Oral BID WC   ferrous sulfate  325 mg Oral Q breakfast   furosemide  40 mg Oral Daily   hydroxychloroquine  200 mg Oral Daily   isosorbide mononitrate  15 mg Oral Daily    mirtazapine  7.5 mg Oral QHS   multivitamin with minerals  1 tablet Oral Daily   pantoprazole  40 mg Oral Daily   predniSONE  5 mg Oral Daily   saccharomyces boulardii  250 mg Oral BID   spironolactone  12.5 mg Oral Daily   Continuous Infusions:    LOS: 0 days    Time spent: over 30 min    Regina Helper, MD [facility]  To contact the attending provider between 7A-7P or the covering provider during after hours 7P-7A, please log into the web site [email or web address] and access using universal [place] password for that web site. If you do not have the password, please call the hospital operator.  03/05/2020, 2:42 PM

## 2020-03-05 NOTE — Progress Notes (Signed)
Progress Note  Patient Name: Regina Ortiz Date of Encounter: 03/05/2020  [facility] Cardiologist: Candee Furbish, MD   Subjective   Sleeping comfortably after endoscopy.  Easily arousable, feels better.  Inpatient Medications    Scheduled Meds: . aspirin  81 mg Oral Daily  . atorvastatin  80 mg Oral Daily  . busPIRone  30 mg Oral BID  . calcium carbonate  500 mg of elemental calcium Oral Daily  . carvedilol  12.5 mg Oral BID WC  . ferrous sulfate  325 mg Oral Q breakfast  . furosemide  40 mg Oral Daily  . hydroxychloroquine  200 mg Oral Daily  . isosorbide mononitrate  15 mg Oral Daily  . mirtazapine  7.5 mg Oral QHS  . multivitamin with minerals  1 tablet Oral Daily  . pantoprazole  40 mg Oral Daily  . predniSONE  5 mg Oral Daily  . saccharomyces boulardii  250 mg Oral BID  . spironolactone  12.5 mg Oral Daily   Continuous Infusions:  PRN Meds: acetaminophen, ALPRAZolam, loperamide, nitroGLYCERIN   Vital Signs    Vitals:   03/05/20 0834 03/05/20 0845 03/05/20 0921 03/05/20 0923  BP: (!) 109/59 (!) 114/57  (!) 152/84  Pulse: 68 68 73 72  Resp: 19 17  18   Temp:   98.2 F (36.8 C) 98.4 F (36.9 C)  TempSrc:   Oral Oral  SpO2: 98% 97% 99% 98%  Weight:      Height:        Intake/Output Summary (Last 24 hours) at 03/05/2020 1125 Last data filed at 03/05/2020 0815 Gross per 24 hour  Intake 1533.42 ml  Output 1450 ml  Net 83.42 ml   Last 3 Weights 03/05/2020 03/05/2020 03/04/2020  Weight (lbs) 91 lb 11.4 oz 91 lb 11.2 oz 90 lb 6.4 oz  Weight (kg) 41.6 kg 41.595 kg 41.005 kg      Telemetry    Sinus rhythm, left bundle branch block-no changes- Personally Reviewed  ECG    Sinus rhythm left bundle atelectatic no changes- Personally Reviewed  Physical Exam   GEN: No acute distress.  Diffuse bruising noted. Neck: No JVD Cardiac: RRR, no murmurs, rubs, or gallops.  Respiratory: Clear to auscultation bilaterally. GI: Soft, nontender, non-distended  MS: No  edema; No deformity. Neuro:  Nonfocal  Psych: Normal affect   Labs    High Sensitivity Troponin:   Recent Labs  Lab 02/09/20 1317 02/09/20 1505 03/04/20 0117 03/04/20 0313  TROPONINIHS 18* 16 24* 29*      Chemistry Recent Labs  Lab 03/04/20 0117 03/04/20 1015 03/05/20 0158  NA 127* 131* 123*  K 4.3 4.5 4.4  CL 95* 95* 93*  CO2 23 26 23   GLUCOSE 128* 108* 111*  BUN 36* 34* 25*  CREATININE 1.58* 1.71* 1.40*  CALCIUM 8.7* 9.1 7.9*  PROT 5.2* 6.0* 5.1*  ALBUMIN 2.7* 3.1* 2.6*  AST 28 29 24   ALT 31 33 28  ALKPHOS 39 41 38  BILITOT 0.4 0.6 0.8  GFRNONAA 32* 29* 37*  GFRAA 37* 33* 42*  ANIONGAP 9 10 7      Hematology Recent Labs  Lab 03/04/20 0117 03/04/20 1015 03/05/20 0158  WBC 7.5 8.9 9.1  RBC 2.77* 3.21* 3.04*  HGB 8.7* 10.1* 9.8*  HCT 26.1* 29.9* 28.4*  MCV 94.2 93.1 93.4  MCH 31.4 31.5 32.2  MCHC 33.3 33.8 34.5  RDW 17.0* 17.0* 17.1*  PLT 164 182 181    BNP Recent Labs  Lab  03/04/20 0117  BNP 1,072.2*     DDimer No results for input(s): DDIMER in the last 168 hours.   Radiology    DG Chest Portable 1 View  Result Date: 03/04/2020 CLINICAL DATA:  Chest pain. EXAM: PORTABLE CHEST 1 VIEW COMPARISON:  02/10/2020. FINDINGS: Cardiomegaly. Unchanged mediastinal contours with aortic atherosclerosis. Resolved interstitial opacities from prior exam. No pleural fluid or pneumothorax. No new airspace disease. There may be trace fluid in the right minor fissure. No acute osseous abnormalities are seen. IMPRESSION: 1. Stable cardiomegaly and aortic atherosclerosis. 2. Resolved interstitial opacities from prior exam. No new findings. Electronically Signed   By: Keith Rake M.D.   On: 03/04/2020 02:25    Cardiac Studies   Echo 01/01/2020-EF 30 to 35%  Cardiac catheterization 01/03/2020-medical management Diagnostic Dominance: Right    Patient Profile     74 y.o. female here with recurrent GI bleed while on Xarelto for atrial fibrillation with  coronary artery disease-medical management, cardiomyopathy partially ischemic with EF 30 to 35%  Assessment & Plan    Chronic systolic heart failure -Not using Entresto because of chronic kidney disease -It would be nice to use hydralazine isosorbide combination however there was some concern about potential allergic reaction/vasculitis regarding hydralazine.  I am fine not utilizing this medication at this time. -Appears euvolemic.  No changes made. -Aldactone okay.  Flat troponin-not ACS.  Coronary artery disease with angina -Anginal symptoms precipitated by severe anemia.  Currently resolved.  Continue with secondary management.  Recent cardiac catheterization reviewed-medical management.  Iron deficiency anemia from GI bleeding source -GI notes reviewed.  EGD with no obvious bleeding source. -Continue with Protonix. -Schatzki's ring.  Hiatal hernia small.  Mild erythema duodenum. -Agree with stopping Xarelto.  Would not utilize aspirin because of recurrent bleeding.   We will sign off.  Please let us know if we can be of further assistance.      For questions or updates, please contact [facility] Please consult [email or web address] for contact info under        Signed, Candee Furbish, MD  03/05/2020, 11:25 AM

## 2020-03-05 NOTE — Transfer of Care (Signed)
Immediate Anesthesia Transfer of Care Note  Patient: Regina Ortiz  Procedure(s) Performed: ESOPHAGOGASTRODUODENOSCOPY (EGD) WITH PROPOFOL (N/A )  Patient Location: Endoscopy Unit  Anesthesia Type:MAC  Level of Consciousness: drowsy  Airway & Oxygen Therapy: Patient Spontanous Breathing and Patient connected to nasal cannula oxygen  Post-op Assessment: Report given to RN and Post -op Vital signs reviewed and stable  Post vital signs: Reviewed and stable  Last Vitals:  Vitals Value Taken Time  BP    Temp    Pulse 67 03/05/20 0814  Resp 18 03/05/20 0814  SpO2 100 % 03/05/20 0814  Vitals shown include unvalidated device data.  Last Pain:  Vitals:   03/05/20 0728  TempSrc: Oral  PainSc:          Complications: No complications documented.

## 2020-03-06 ENCOUNTER — Ambulatory Visit: Payer: PPO | Admitting: Podiatry

## 2020-03-06 ENCOUNTER — Inpatient Hospital Stay (HOSPITAL_COMMUNITY): Payer: PPO

## 2020-03-06 LAB — CBC WITH DIFFERENTIAL/PLATELET
Abs Immature Granulocytes: 0.03 10*3/uL (ref 0.00–0.07)
Basophils Absolute: 0 10*3/uL (ref 0.0–0.1)
Basophils Relative: 0 %
Eosinophils Absolute: 0 10*3/uL (ref 0.0–0.5)
Eosinophils Relative: 0 %
HCT: 25.7 % — ABNORMAL LOW (ref 36.0–46.0)
Hemoglobin: 8.7 g/dL — ABNORMAL LOW (ref 12.0–15.0)
Immature Granulocytes: 0 %
Lymphocytes Relative: 27 %
Lymphs Abs: 1.9 10*3/uL (ref 0.7–4.0)
MCH: 31.3 pg (ref 26.0–34.0)
MCHC: 33.9 g/dL (ref 30.0–36.0)
MCV: 92.4 fL (ref 80.0–100.0)
Monocytes Absolute: 0.5 10*3/uL (ref 0.1–1.0)
Monocytes Relative: 8 %
Neutro Abs: 4.5 10*3/uL (ref 1.7–7.7)
Neutrophils Relative %: 65 %
Platelets: 170 10*3/uL (ref 150–400)
RBC: 2.78 MIL/uL — ABNORMAL LOW (ref 3.87–5.11)
RDW: 17.2 % — ABNORMAL HIGH (ref 11.5–15.5)
WBC: 6.9 10*3/uL (ref 4.0–10.5)
nRBC: 0 % (ref 0.0–0.2)

## 2020-03-06 LAB — COMPREHENSIVE METABOLIC PANEL
ALT: 26 U/L (ref 0–44)
AST: 22 U/L (ref 15–41)
Albumin: 2.6 g/dL — ABNORMAL LOW (ref 3.5–5.0)
Alkaline Phosphatase: 35 U/L — ABNORMAL LOW (ref 38–126)
Anion gap: 8 (ref 5–15)
BUN: 30 mg/dL — ABNORMAL HIGH (ref 8–23)
CO2: 21 mmol/L — ABNORMAL LOW (ref 22–32)
Calcium: 8.2 mg/dL — ABNORMAL LOW (ref 8.9–10.3)
Chloride: 93 mmol/L — ABNORMAL LOW (ref 98–111)
Creatinine, Ser: 1.61 mg/dL — ABNORMAL HIGH (ref 0.44–1.00)
GFR calc Af Amer: 36 mL/min — ABNORMAL LOW (ref >60)
GFR calc non Af Amer: 31 mL/min — ABNORMAL LOW (ref >60)
Glucose, Bld: 109 mg/dL — ABNORMAL HIGH (ref 70–99)
Potassium: 4.1 mmol/L (ref 3.5–5.1)
Sodium: 122 mmol/L — ABNORMAL LOW (ref 135–145)
Total Bilirubin: 0.6 mg/dL (ref 0.3–1.2)
Total Protein: 4.9 g/dL — ABNORMAL LOW (ref 6.5–8.1)

## 2020-03-06 LAB — BASIC METABOLIC PANEL
Anion gap: 9 (ref 5–15)
Anion gap: 8 (ref 5–15)
BUN: 28 mg/dL — ABNORMAL HIGH (ref 8–23)
BUN: 27 mg/dL — ABNORMAL HIGH (ref 8–23)
CO2: 20 mmol/L — ABNORMAL LOW (ref 22–32)
CO2: 23 mmol/L (ref 22–32)
Calcium: 8.1 mg/dL — ABNORMAL LOW (ref 8.9–10.3)
Calcium: 8 mg/dL — ABNORMAL LOW (ref 8.9–10.3)
Chloride: 92 mmol/L — ABNORMAL LOW (ref 98–111)
Chloride: 94 mmol/L — ABNORMAL LOW (ref 98–111)
Creatinine, Ser: 1.64 mg/dL — ABNORMAL HIGH (ref 0.44–1.00)
Creatinine, Ser: 1.71 mg/dL — ABNORMAL HIGH (ref 0.44–1.00)
GFR calc Af Amer: 35 mL/min — ABNORMAL LOW (ref >60)
GFR calc Af Amer: 33 mL/min — ABNORMAL LOW (ref >60)
GFR calc non Af Amer: 30 mL/min — ABNORMAL LOW (ref >60)
GFR calc non Af Amer: 29 mL/min — ABNORMAL LOW (ref >60)
Glucose, Bld: 106 mg/dL — ABNORMAL HIGH (ref 70–99)
Glucose, Bld: 126 mg/dL — ABNORMAL HIGH (ref 70–99)
Potassium: 3.9 mmol/L (ref 3.5–5.1)
Potassium: 3.8 mmol/L (ref 3.5–5.1)
Sodium: 121 mmol/L — ABNORMAL LOW (ref 135–145)
Sodium: 125 mmol/L — ABNORMAL LOW (ref 135–145)

## 2020-03-06 LAB — MAGNESIUM: Magnesium: 1.9 mg/dL (ref 1.7–2.4)

## 2020-03-06 LAB — PHOSPHORUS: Phosphorus: 3.3 mg/dL (ref 2.5–4.6)

## 2020-03-06 LAB — TSH: TSH: 1.04 u[IU]/mL (ref 0.350–4.500)

## 2020-03-06 MED ORDER — FUROSEMIDE 40 MG PO TABS: 40.0000 mg | ORAL_TABLET | Freq: Once | ORAL | Status: DC

## 2020-03-06 MED ORDER — FUROSEMIDE 10 MG/ML IJ SOLN: 40.0000 mg | Freq: Once | INTRAMUSCULAR | Status: DC

## 2020-03-06 MED ORDER — FUROSEMIDE 40 MG PO TABS
40.0000 mg | ORAL_TABLET | Freq: Once | ORAL | Status: AC
  Administered 2020-03-06: 40 mg via ORAL
  Filled 2020-03-06: qty 1

## 2020-03-06 NOTE — Plan of Care (Signed)
  Problem: Clinical Measurements: Goal: Respiratory complications will improve Outcome: Progressing   Problem: Activity: Goal: Risk for activity intolerance will decrease Outcome: Progressing   Problem: Safety: Goal: Ability to remain free from injury will improve Outcome: Progressing   

## 2020-03-06 NOTE — Evaluation (Signed)
Physical Therapy Evaluation Patient Details Name: Soo Steelman MRN: [id number] DOB: Dec 12, 1945 Today's Date: 03/06/2020   History of Present Illness  Almee Pelphrey is a 74 y.o. female with medical history significant for A. fib on Xarelto, hypertension, sCHF (EF 30-35%), COPD (not on home oxygen), rheumatoid arthritis on Plaquenil who presents to the emergency department due to chest pain that started few hours prior to arrival at the ED.  Chest pain was midsternal and was described as pressure-like, it was rated as 6-7/10 on pain scale and with radiation towards the throat.  She was recently admitted on 6/10 and discharged on 6/14 due to acute GI bleed.  Clinical Impression  Pt admitted with above diagnosis. Pt was able to ambulate with rW in halls with good safety overall. Should progress well.  Pt currently with functional limitations due to the deficits listed below (see PT Problem List). Pt will benefit from skilled PT to increase their independence and safety with mobility to allow discharge to the venue listed below.      Follow Up Recommendations Home health PT;Supervision - Intermittent    Equipment Recommendations  None recommended by PT    Recommendations for Other Services       Precautions / Restrictions Precautions Precautions: Fall Precaution Comments: monitor vitals Restrictions Weight Bearing Restrictions: No      Mobility  Bed Mobility Overal bed mobility: Needs Assistance Bed Mobility: Supine to Sit     Supine to sit: Min guard     General bed mobility comments: No assist to come to eOB  Transfers Overall transfer level: Needs assistance Equipment used: Rolling walker (2 wheeled) Transfers: Sit to/from Stand Sit to Stand: Min guard         General transfer comment: min guard to come to standing.  cues for hand placemetn  Ambulation/Gait Ambulation/Gait assistance: Min guard Gait Distance (Feet): 150 Feet Assistive device: Rolling walker  (2 wheeled) Gait Pattern/deviations: Step-through pattern;Decreased stride length;Drifts right/left;Trunk flexed   Gait velocity interpretation: <1.31 ft/sec, indicative of household ambulator General Gait Details: Pt did well with ambulation overall.  Pt was safe with use of RW.  slightly flexed posture at times.    Stairs            Wheelchair Mobility    Modified Rankin (Stroke Patients Only)       Balance Overall balance assessment: Needs assistance Sitting-balance support: Feet supported;No upper extremity supported Sitting balance-Leahy Scale: Fair     Standing balance support: Bilateral upper extremity supported;During functional activity Standing balance-Leahy Scale: Poor Standing balance comment: requires UE support on RW                             Pertinent Vitals/Pain Pain Assessment: No/denies pain    Home Living Family/patient expects to be discharged to:: Private residence Living Arrangements: Alone Available Help at Discharge: Personal care attendant;Available PRN/intermittently (9-5 M-Sun caregiver; 2x/wk therapy) Type of Home: House Home Access: Stairs to enter Entrance Stairs-Rails: Chemical engineer of Steps: 3 Home Layout: Two level;Able to live on main level with bedroom/bathroom;1/2 bath on main level Home Equipment: Walker - 2 wheels;Bedside commode;Walker - 4 wheels Additional Comments: caregiver roughly 8 hrs a day for homemaking and , supervision for sponge bath pt doesn't go upstairs to shower    Prior Function Level of Independence: Needs assistance   Gait / Transfers Assistance Needed: rollator for room to room trips  ADL's / Homemaking  Assistance Needed: aide helps with dressing and bathing, sponge bathing as tub upstairs  Comments: has been able to walk and bathe herself but requires help on back     Hand Dominance   Dominant Hand: Right    Extremity/Trunk Assessment   Upper Extremity  Assessment Upper Extremity Assessment: Generalized weakness    Lower Extremity Assessment Lower Extremity Assessment: Generalized weakness    Cervical / Trunk Assessment Cervical / Trunk Assessment: Kyphotic  Communication   Communication: No difficulties  Cognition Arousal/Alertness: Awake/alert Behavior During Therapy: Flat affect Overall Cognitive Status: Within Functional Limits for tasks assessed                                 General Comments: able to follow instructions, generally weak      General Comments General comments (skin integrity, edema, etc.): Pt c/o pain in bil heels.  Note that pt has dressings on them.  Floated heels on pillow. Also c/o pain due to buttock wound and pt on pillow in chair as well.     Exercises     Assessment/Plan    PT Assessment Patient needs continued PT services  PT Problem List Decreased strength;Decreased range of motion;Decreased activity tolerance;Decreased balance;Decreased mobility;Decreased coordination;Decreased knowledge of use of DME;Decreased safety awareness;Cardiopulmonary status limiting activity;Decreased skin integrity;Pain       PT Treatment Interventions DME instruction;Gait training;Stair training;Functional mobility training;Therapeutic activities;Therapeutic exercise;Balance training;Neuromuscular re-education;Patient/family education    PT Goals (Current goals can be found in the Care Plan section)  Acute Rehab PT Goals Patient Stated Goal: to feel less anxious PT Goal Formulation: With patient Time For Goal Achievement: 03/20/20 Potential to Achieve Goals: Good    Frequency Min 3X/week   Barriers to discharge   has caregiver most of day each day and stays on ground level    Co-evaluation               AM-PAC PT "6 Clicks" Mobility  Outcome Measure Help needed turning from your back to your side while in a flat bed without using bedrails?: None Help needed moving from lying on your  back to sitting on the side of a flat bed without using bedrails?: None Help needed moving to and from a bed to a chair (including a wheelchair)?: A Little Help needed standing up from a chair using your arms (e.g., wheelchair or bedside chair)?: A Little Help needed to walk in hospital room?: A Little Help needed climbing 3-5 steps with a railing? : A Little 6 Click Score: 20    End of Session Equipment Utilized During Treatment: Gait belt Activity Tolerance: Patient limited by fatigue Patient left: in chair;with call bell/phone within reach;with chair alarm set Nurse Communication: Mobility status PT Visit Diagnosis: Unsteadiness on feet (R26.81);Muscle weakness (generalized) (M62.81)    Time: 5643-3295 PT Time Calculation (min) (ACUTE ONLY): 23 min   Charges:   PT Evaluation $PT Eval Moderate Complexity: 1 Mod PT Treatments $Gait Training: 8-22 mins        Freada Twersky W,PT Acute Rehabilitation Services Pager:  [phone]  Office:  [place] 03/06/2020, 11:23 AM

## 2020-03-06 NOTE — Progress Notes (Signed)
PROGRESS NOTE    Regina Ortiz  MGQ:[id number] DOB: [birth year] DOA: 03/04/2020 PCP: Gayland Curry, DO   No chief complaint on file.  Brief Narrative:  Regina Ortiz is Regina Ortiz 74 y.o. female with medical history significant for Regina Ortiz. fib on Xarelto, hypertension, sCHF (EF 30-35%), COPD (not on home oxygen), rheumatoid arthritis on Plaquenil who presents to the emergency department due to chest pain that started few hours prior to arrival at the ED.  She was admitted with chest pain and recurrent anemia with heme positive stools.  She was recently discharged on 6/14 for treatment for Regina Ortiz GI bleed.  She's now s/p repeat EGD which did not reveal any active bleeding.  Plan to discontinue xarelto and aspirin.  Cardiology also evaluated her and agreed with discontinuation of xarelto and aspirin.  Suspected chest pain likely related to anemia.  Hospitalization has been complicated by hyponatremia.  Discharge pending improvement in hyponatremia.   Assessment & Plan:   Principal Problem:   Chest pain Active Problems:   Hyponatremia   Acute on chronic kidney failure (HCC)   Hypertension   LBBB (left bundle branch block)   Depression   Hypertensive urgency   Prolonged QT interval   COPD (chronic obstructive pulmonary disease) (HCC)   Elevated troponin   GI bleeding   Atrial fibrillation, chronic (HCC)  Hyponatremia Worsened again, appears euvolemic Follow serum osm (low), urine na (47), urine osm (333) - supports SIADH Follow TSH Discontinue spironolactone  Continue fluid restriction (750 cc/daily), continue home lasix - will give additional dose of lasix today and follow repeat sodium (weight is up almost 1 kg since yesterday) Consider nephrology c/s    Chest pain  Coronary Artery Disease:  CP resolved, troponin mildly elevated, relatively flat Recent cath 12/2019 with heavy L main and proximal LAD calcification, mod to mod severe proximal LAD diagonal bifurcation stenosis up to 70%, mid  anterior wall hypokinesis greater than other walls related to ischemia from LAD vs dyssynchrony associated with LBBB  Appreciate cardiology recommendations - suspect angina precipitated by anemia, now resolved, recommending medical management  Continue statin, holding aspirin at this time with GI bleed - per cardiology, continue to hold aspirin due to recurrent bleeding  Acute GI bleed  Acute Blood Loss Anemia FOBT was positive Holding xarelto and aspirin at this time - xarelto and aspirin will not be resumed GI consult, appreciate recommendations - EGD with mild erythematous mucosa in the antrum no evidence of active bleeding, mild erythema noted in the first portion of the duodenum  Follow up with Dr. Therisa Doyne within 4-6 weeks, discontinue xarelto Previous workup with EGD and capsule endoscopy in 01/2020 were unrevealing  Continue PPI   Hypertensive urgency hypertension Follow with BP meds, coreg, spironolactone, imdur, lasix   Systolic CHF LVEF 26-71%, proBNP 1072.2 Appears relatively euvolemic at this point Restart coreg, aldactone.  Continue imdur.  Hold hydralazine - concern for hydralazine induced vasculitis on last d/c summary - pt was supposed to discuss this with outpatient providers. Cardiology c/s, appreciate recommendations (signed off 7/5)  COPD not in acute exacerbation Continue to monitor  Acute on chronic kidney disease Creatinine on admission=1.58, GFR 32   baseline creatinine = 1.07-1.58 Continue to monitor, 1.64 today Renally adjust medications, avoid nephrotoxic agents/dehydration/hypotension  Hyperlipidemia Statin   Regina Ortiz. fib rate controlled Hold Xarelto at this time - will likely not resume this Continue telemetry  Ecchymoses: diffuse bruising, follow with holding xarelto.  Platelets normal.  INR elevated, likely related  to doac.  She notes this is distressing to her with how it looks/she notes that she's embarrassed by it, continue to follow and support  outpatient as we've stopped xarelto/aspirin.  DVT prophylaxis: xarelto  Code Status: full  Family Communication: none at bedside - son on phone Disposition:   Status is: inpatient  The patient will require care spanning > 2 midnights and should be moved to inpatient because: worsening hyponatremia requiring w/u and management, pending therapy eval  Dispo: The patient is from: Home              Anticipated d/c is to: pending              Anticipated d/c date is: 1 day              Patient currently is not medically stable to d/c.  Consultants:   Cardiology  GI  Procedures:  EGD Impression - Normal upper third of esophagus, middle third of esophagus and lower third of esophagus. - Widely patent Schatzki ring. - Small hiatal hernia. - Erythematous mucosa in the pylorus. - Normal duodenal bulb and second portion of the duodenum. - Erythematous duodenopathy. - No specimens collected. Recommendations - Resume regular diet. - Use Protonix (pantoprazole) 40 mg PO daily. - As per recommendations from cardiology, xarelto is going to be stopped due to recurrent anemia. - Oral iron supplementation.  Antimicrobials:  Anti-infectives (From admission, onward)   Start     Dose/Rate Route Frequency Ordered Stop   03/05/20 1100  hydroxychloroquine (PLAQUENIL) tablet 200 mg     Discontinue     200 mg Oral Daily 03/05/20 1007       Subjective: No new complaints  Objective: Vitals:   03/06/20 0311 03/06/20 0335 03/06/20 0833 03/06/20 1225  BP:  129/82 119/72 121/64  Pulse:  71 80 73  Resp:  18  16  Temp:  98.7 F (37.1 C)  (!) 97.5 F (36.4 C)  TempSrc:  Oral  Oral  SpO2:  98% 100% 100%  Weight: 42.3 kg     Height:        Intake/Output Summary (Last 24 hours) at 03/06/2020 1452 Last data filed at 03/06/2020 1300 Gross per 24 hour  Intake 840 ml  Output 800 ml  Net 40 ml   Filed Weights   03/05/20 0443 03/05/20 0728 03/06/20 0311  Weight: 41.6 kg 41.6 kg 42.3 kg     Examination:  General: No acute distress. Cardiovascular: Heart sounds show Regina Ortiz regular rate, and rhythm.  Lungs: Clear to auscultation bilaterally  Abdomen: Soft, nontender, nondistended Neurological: Alert and oriented 3. Moves all extremities 4. Cranial nerves II through XII grossly intact. Skin: Warm and dry. No rashes or lesions. Extremities: No clubbing or cyanosis. No edema.    Data Reviewed: I have personally reviewed following labs and imaging studies  CBC: Recent Labs  Lab 03/04/20 0117 03/04/20 1015 03/05/20 0158 03/06/20 0425  WBC 7.5 8.9 9.1 6.9  NEUTROABS 4.8  --   --  4.5  HGB 8.7* 10.1* 9.8* 8.7*  HCT 26.1* 29.9* 28.4* 25.7*  MCV 94.2 93.1 93.4 92.4  PLT 164 182 181 287    Basic Metabolic Panel: Recent Labs  Lab 03/04/20 0117 03/04/20 0117 03/04/20 1015 03/05/20 0158 03/05/20 1227 03/06/20 0425 03/06/20 1230  NA 127*   < > 131* 123* 125* 122* 121*  K 4.3  --  4.5 4.4  --  4.1 3.9  CL 95*  --  95* 93*  --  93* 92*  CO2 23  --  26 23  --  21* 20*  GLUCOSE 128*  --  108* 111*  --  109* 106*  BUN 36*  --  34* 25*  --  30* 28*  CREATININE 1.58*  --  1.71* 1.40*  --  1.61* 1.64*  CALCIUM 8.7*  --  9.1 7.9*  --  8.2* 8.1*  MG  --   --   --   --   --  1.9  --   PHOS  --   --   --   --   --  3.3  --    < > = values in this interval not displayed.    GFR: Estimated Creatinine Clearance: 19.8 mL/min (Regina Ortiz) (by C-G formula based on SCr of 1.64 mg/dL (H)).  Liver Function Tests: Recent Labs  Lab 03/04/20 0117 03/04/20 1015 03/05/20 0158 03/06/20 0425  AST 28 29 24 22   ALT 31 33 28 26  ALKPHOS 39 41 38 35*  BILITOT 0.4 0.6 0.8 0.6  PROT 5.2* 6.0* 5.1* 4.9*  ALBUMIN 2.7* 3.1* 2.6* 2.6*    CBG: No results for input(s): GLUCAP in the last 168 hours.   Recent Results (from the past 240 hour(s))  SARS Coronavirus 2 by RT PCR (hospital order, performed in [facility] hospital lab) Nasopharyngeal Nasopharyngeal Swab     Status: None    Collection Time: 03/04/20  4:31 AM   Specimen: Nasopharyngeal Swab  Result Value Ref Range Status   SARS Coronavirus 2 NEGATIVE NEGATIVE Final    Comment: (NOTE) SARS-CoV-2 target nucleic acids are NOT DETECTED.  The SARS-CoV-2 RNA is generally detectable in upper and lower respiratory specimens during the acute phase of infection. The lowest concentration of SARS-CoV-2 viral copies this assay can detect is 250 copies / mL. Regina Ortiz negative result does not preclude SARS-CoV-2 infection and should not be used as the sole basis for treatment or other patient management decisions.  Regina Ortiz negative result may occur with improper specimen collection / handling, submission of specimen other than nasopharyngeal swab, presence of viral mutation(s) within the areas targeted by this assay, and inadequate number of viral copies (<250 copies / mL). Regina Ortiz negative result must be combined with clinical observations, patient history, and epidemiological information.  Fact Sheet for Patients:   StrictlyIdeas.no  Fact Sheet for Healthcare Providers: BankingDealers.co.za  This test is not yet approved or  cleared by the [place] FDA and has been authorized for detection and/or diagnosis of SARS-CoV-2 by FDA under an Emergency Use Authorization (EUA).  This EUA will remain in effect (meaning this test can be used) for the duration of the COVID-19 declaration under Section 564(b)(1) of the Act, 21 U.S.C. section 360bbb-3(b)(1), unless the authorization is terminated or revoked sooner.  Performed at [facility], [address] [place], [place] [address]          Radiology Studies: DG CHEST PORT 1 VIEW  Result Date: 03/06/2020 CLINICAL DATA:  Shortness of breath. EXAM: PORTABLE CHEST 1 VIEW COMPARISON:  March 04, 2020. FINDINGS: Stable cardiomegaly. No pneumothorax or pleural effusion is noted. Both lungs are clear. The visualized skeletal structures  are unremarkable. IMPRESSION: No active disease. Aortic Atherosclerosis (ICD10-I70.0). Electronically Signed   By: Marijo Conception M.D.   On: 03/06/2020 10:26        Scheduled Meds: . atorvastatin  80 mg Oral Daily  . busPIRone  30 mg Oral BID  . calcium carbonate  500 mg of elemental calcium Oral Daily  . carvedilol  12.5 mg Oral BID WC  . ferrous sulfate  325 mg Oral Q breakfast  . furosemide  40 mg Oral Daily  . furosemide  40 mg Oral Once  . hydroxychloroquine  200 mg Oral Daily  . isosorbide mononitrate  15 mg Oral Daily  . mirtazapine  7.5 mg Oral QHS  . multivitamin with minerals  1 tablet Oral Daily  . pantoprazole  40 mg Oral Daily  . predniSONE  5 mg Oral Daily  . saccharomyces boulardii  250 mg Oral BID   Continuous Infusions:    LOS: 1 day    Time spent: over 30 min    Fayrene Helper, MD [facility]   To contact the attending provider between 7A-7P or the covering provider during after hours 7P-7A, please log into the web site [email or web address] and access using universal [place] password for that web site. If you do not have the password, please call the hospital operator.  03/06/2020, 2:52 PM

## 2020-03-07 LAB — CBC WITH DIFFERENTIAL/PLATELET
Abs Immature Granulocytes: 0.08 10*3/uL — ABNORMAL HIGH (ref 0.00–0.07)
Basophils Absolute: 0 10*3/uL (ref 0.0–0.1)
Basophils Relative: 0 %
Eosinophils Absolute: 0 10*3/uL (ref 0.0–0.5)
Eosinophils Relative: 0 %
HCT: 27.2 % — ABNORMAL LOW (ref 36.0–46.0)
Hemoglobin: 9.1 g/dL — ABNORMAL LOW (ref 12.0–15.0)
Immature Granulocytes: 1 %
Lymphocytes Relative: 18 %
Lymphs Abs: 1.5 10*3/uL (ref 0.7–4.0)
MCH: 31.7 pg (ref 26.0–34.0)
MCHC: 33.5 g/dL (ref 30.0–36.0)
MCV: 94.8 fL (ref 80.0–100.0)
Monocytes Absolute: 0.4 10*3/uL (ref 0.1–1.0)
Monocytes Relative: 4 %
Neutro Abs: 6.4 10*3/uL (ref 1.7–7.7)
Neutrophils Relative %: 77 %
Platelets: 169 10*3/uL (ref 150–400)
RBC: 2.87 MIL/uL — ABNORMAL LOW (ref 3.87–5.11)
RDW: 17 % — ABNORMAL HIGH (ref 11.5–15.5)
WBC: 8.4 10*3/uL (ref 4.0–10.5)
nRBC: 0 % (ref 0.0–0.2)

## 2020-03-07 LAB — MAGNESIUM: Magnesium: 1.7 mg/dL (ref 1.7–2.4)

## 2020-03-07 LAB — URINALYSIS, ROUTINE W REFLEX MICROSCOPIC
Bilirubin Urine: NEGATIVE
Glucose, UA: NEGATIVE mg/dL
Ketones, ur: NEGATIVE mg/dL
Nitrite: NEGATIVE
Protein, ur: NEGATIVE mg/dL
RBC / HPF: >50 RBC/hpf — ABNORMAL HIGH (ref 0–5)
Specific Gravity, Urine: 1.004 — ABNORMAL LOW (ref 1.005–1.030)
WBC, UA: >50 WBC/hpf — ABNORMAL HIGH (ref 0–5)
pH: 5 (ref 5.0–8.0)

## 2020-03-07 LAB — COMPREHENSIVE METABOLIC PANEL
ALT: 26 U/L (ref 0–44)
AST: 25 U/L (ref 15–41)
Albumin: 2.7 g/dL — ABNORMAL LOW (ref 3.5–5.0)
Alkaline Phosphatase: 39 U/L (ref 38–126)
Anion gap: 10 (ref 5–15)
BUN: 26 mg/dL — ABNORMAL HIGH (ref 8–23)
CO2: 22 mmol/L (ref 22–32)
Calcium: 8.2 mg/dL — ABNORMAL LOW (ref 8.9–10.3)
Chloride: 95 mmol/L — ABNORMAL LOW (ref 98–111)
Creatinine, Ser: 1.57 mg/dL — ABNORMAL HIGH (ref 0.44–1.00)
GFR calc Af Amer: 37 mL/min — ABNORMAL LOW (ref >60)
GFR calc non Af Amer: 32 mL/min — ABNORMAL LOW (ref >60)
Glucose, Bld: 170 mg/dL — ABNORMAL HIGH (ref 70–99)
Potassium: 3.4 mmol/L — ABNORMAL LOW (ref 3.5–5.1)
Sodium: 127 mmol/L — ABNORMAL LOW (ref 135–145)
Total Bilirubin: 0.7 mg/dL (ref 0.3–1.2)
Total Protein: 5.1 g/dL — ABNORMAL LOW (ref 6.5–8.1)

## 2020-03-07 LAB — PHOSPHORUS: Phosphorus: 2.3 mg/dL — ABNORMAL LOW (ref 2.5–4.6)

## 2020-03-07 MED ORDER — POTASSIUM CHLORIDE CRYS ER 20 MEQ PO TBCR
40.0000 meq | EXTENDED_RELEASE_TABLET | Freq: Once | ORAL | Status: AC
  Administered 2020-03-07: 40 meq via ORAL
  Filled 2020-03-07: qty 2

## 2020-03-07 MED ORDER — SODIUM CHLORIDE 0.9 % IV SOLN
1.0000 g | INTRAVENOUS | Status: DC
  Administered 2020-03-07 – 2020-03-08 (×2): 1 g via INTRAVENOUS
  Filled 2020-03-07 (×2): qty 10

## 2020-03-07 NOTE — Progress Notes (Signed)
03/07/20 1600  Clinical Encounter Type  Visited With Patient  Visit Type Initial  Referral From Patient  Consult/Referral To Chaplain  Spiritual Encounters  Spiritual Needs Emotional;Grief support  Stress Factors  Patient Stress Factors Family relationships;Loss;Health changes;Lack of knowledge  Family Stress Factors Family relationships   Chaplain responded to page for patient support @ 14:25. Tyrese greeted Chaplain and requested that Chaplain "pull up a chair and sit down." Israel informed Chaplain that Jaiyana is mad at her care giver Anne Ng and her son Shanon Brow. Cerita is also mad at God, "but knows God has too much to take care of for Aaryn to be mad at God." Trent's husband Dominica Severin was a Contractor and took his own life a few years ago. Bexley is presently grieving the loss of her husband Dominica Severin and reports to Chaplain that she is the one who found Dominica Severin dead. Jesika states, "I am mad at [place] for killing himself. I am mad at [facility] for not being here with me and helping me make decisions. Dominica Severin was a strong man, stronger than me in my faith." Ayan states "Aennett should not have told me what she did." Anne Ng told Emmett, "it is just going to keep happening and one day you are going to die. There is nothing else the doctors can do for you." Kemari said she is mad at [facility] because she feels like they are having side conversations about her and withholding information from Israel. Jaymes wants everyone, including her medical team, to be straight forward with her in reference to her medical status and if she is in fact dying. Rushie made the statement several times, "if it (death) is going to happen lets just get it over with instead of making me worry about the when and how for the rest of my life. I am not afraid to die. I am afraid of the process and the pain." Chaplain asked if Mccall felt that a conversation with the Palliative care team would interest Kimberlly, seeing how the team can educate around death and dying,  as well as assist in the management of chronic medical symptoms in the life span that Demia has remaining on the earth. Charolett agreed that a palliative care conversation would be of benefit to her and her family. Hiromi mentioned also several times her struggle with depression and anxiety. Echo was seeing a counselor weekly prior to COVID-19. This counselor was provided through Hospice. That particular counselor is not currently available for adult support. However, Morgane is in need of a new counselor/therapist (Preferred female) who can see Shakeena at her place of residence or has a transportation option.  Jayliani mentioned several times the lack of desire to continue living.  Kaytlynn did not mention a plan for suicide. Candita did mention being angry at her son Shanon Brow for not caring enough to visit more. Matricia is angry with Anne Ng for telling Glendia basically that her health is going to continue to deteriorate and death will be the end result. Emmalise is angry with her father for dying 2 day prior to the day she started school when she was 74 years old.  Vinetta is angry with her husband Dominica Severin for committing suicide.  Jamelia is mad at God for several poor outcomes in her life. Rai is mad at her neighbor Romie Minus for caring but not having the ability to actively listen and understand what life is like with chronic anxiety.   Kasumi is haunted by a constant fear of "  what if and what next?" Carlie is not sleeping well. "Sometimes I wake up more tired than when I went to bed." Chaplain asked if Josephyne is having detailed dreams. Alonna said, "Yes, it is like if I am trying to get from the bed to the window, it is not that far and it should not be that hard. But hills of sand keep forming and I keep climbing and climbing, and I never get to the window. I wake up and I am so tired from climbing." Chaplain asked if Jennise has ever had the experience of her mind being completely silent. Levita said, "No! My mind is always going. It never stops thinking and  it gets even worse when I sleep."   Chaplain sensed that Serrena is lonely and in need of human one on one interaction. Teryn would benefit from skin to skin contact, holding hands, pat on the back, hugs. Yousra is difficult to assist in coping due to her dislike of many of the regular coping tools. Shannette does not enjoy reading, coloring, television, games, puzzles, or gardening/getting dirty. Estha states, "I am just on a pity pot." Chaplain offers ministry of presence and active listening. Chaplains remain available for support as needs arise.   Chaplain Resident, Evelene Croon, [place] [phone]

## 2020-03-07 NOTE — TOC Initial Note (Signed)
Transition of Care [facility]) - Initial/Assessment Note    Patient Details  Name: Regina Ortiz MRN: [id number] Date of Birth: Jul 07, 1946  Transition of Care [facility]) CM/SW Contact:    Zenon Mayo, RN Phone Number: 03/07/2020, 2:45 PM  Clinical Narrative:                 Patient is from home ,she has care givers around the clock.  [place] [phone] at bedside states patient will need transport at discharge tomorrow. She will also need HHRN,HHPT,  NCM offered choice to patient, she states she would like [facility] , she has had them in the past .  NCM made referral to [facility] with [name] and he states he can take referral.  Soc will begin 24 to 48 hrs post dc.  The  Caregiver that will be receiving patient at discharge at her home is [facility] 336 [phone].  Expected Discharge Plan: [place] Barriers to Discharge: Continued Medical Work up   Patient Goals and CMS Choice Patient states their goals for this hospitalization and ongoing recovery are:: get better CMS Medicare.gov Compare Post Acute Care list provided to:: Patient Choice offered to / list presented to : Patient  Expected Discharge Plan and Services Expected Discharge Plan: [place]   Discharge Planning Services: CM Consult Post Acute Care Choice: [place] arrangements for the past 2 months: Single Family Home                   DME Agency: NA       HH Arranged: RN, PT, Disease Management [place] Agency: [place] Date [facility] Agency Contacted: 03/07/20 Time HH Agency Contacted: 15 Representative spoke with at [place]: [name]  Prior Living Arrangements/Services Living arrangements for the past 2 months: [place] Lives with:: Self Patient language and need for interpreter reviewed:: Yes Do you feel safe going back to the place where you live?: Yes      Need for Family Participation in Patient Care: Yes (Comment) Care giver  support system in place?: Yes (comment) Current home services: Homehealth aide Criminal Activity/Legal Involvement Pertinent to Current Situation/Hospitalization: No - Comment as needed  Activities of Daily Living Home Assistive Devices/Equipment: Walker (specify type) ADL Screening (condition at time of admission) Patient's cognitive ability adequate to safely complete daily activities?: Yes Is the patient deaf or have difficulty hearing?: No Does the patient have difficulty seeing, even when wearing glasses/contacts?: No Does the patient have difficulty concentrating, remembering, or making decisions?: No Patient able to express need for assistance with ADLs?: Yes Does the patient have difficulty dressing or bathing?: No Independently performs ADLs?: Yes (appropriate for developmental age) Does the patient have difficulty walking or climbing stairs?: Yes Weakness of Legs: Both Weakness of Arms/Hands: Both  Permission Sought/Granted                  Emotional Assessment Appearance:: Appears stated age Attitude/Demeanor/Rapport: Engaged Affect (typically observed): Appropriate Orientation: : Oriented to Self, Oriented to Place, Oriented to  Time, Oriented to Situation Alcohol / Substance Use: Not Applicable Psych Involvement: No (comment)  Admission diagnosis:  Melena [K92.1] Chest pain [R07.9] Chest pain, unspecified type [R07.9] Patient Active Problem List   Diagnosis Date Noted  . Atrial fibrillation, chronic ([place]) 03/04/2020  . Macrocytosis 02/09/2020  . GI bleeding 02/09/2020  . Acute on chronic combined systolic and diastolic CHF (congestive heart failure) (  [place]) 01/03/2020  . Acute pulmonary edema (HCC)   . Acute respiratory failure with hypoxia ([place]) 12/31/2019  . COPD (chronic obstructive pulmonary disease) ([place]) 12/31/2019  . Elevated troponin 12/31/2019  . Osteopenia 11/01/2019  . Protein-calorie malnutrition, severe 08/19/2019  . Urinary frequency  04/15/2019  . Senile osteoporosis   . Hyperlipidemia   . Adrenal insufficiency ([place]) 04/13/2019  . Uncontrolled hypertension 04/13/2019  . Bilateral lower extremity edema 04/13/2019  . UTI (urinary tract infection) 04/02/2019  . Pain on right lower extremity 03/31/2019  . Prolonged QT interval 03/22/2019  . Acute blood loss anemia 03/21/2019  . Pressure injury of skin 03/15/2019  . Fracture of femoral neck, right, closed ([place]) 03/14/2019  . Fall at home, initial encounter 03/12/2019  . Urinary retention 02/15/2019  . Lumbosacral radiculopathy 02/15/2019  . Constipation 02/15/2019  . Acute urinary retention 02/15/2019  . Cauda equina syndrome ([place]) 01/28/2019  . Chest pain 09/25/2017  . Hypertensive urgency 09/24/2017  . Headache 09/24/2017  . Alcohol use 09/24/2017  . AKI (acute kidney injury) ([place]) 09/24/2017  . Bilateral carotid artery stenosis   . Vulvovaginal candidiasis 10/22/2015  . Mild malnutrition ([place]) 07/07/2013  . Chronic bronchitis ([place]) 07/07/2013  . Tobacco abuse 07/07/2013  . Hypertension   . Short of breath on exertion   . Hemorrhoid   . LBBB (left bundle branch block)   . Anxiety   . Depression   . Rheumatoid arthritis ([place])   . Venous insufficiency of leg   . Chronic hyponatremia   . AIN grade I 04/05/2013  . Diarrhea 09/22/2012  . Hyponatremia 09/22/2012  . Hypokalemia 09/22/2012  . Acute on chronic kidney failure ([place]) 09/22/2012  . Atypical chest pain 06/22/2012  . Symptomatic anemia 06/22/2012  . Vulvar intraepithelial neoplasia III (VIN III) 01/29/2012   PCP:  Gayland Curry, DO Pharmacy:   [place], [place] - [address] [place] [place] [address] Phone: [phone] Fax: [place], [place] [address] [place] [address] Phone: [phone] Fax: [place],  [place] [address] Phone: [phone] Fax: [phone]     Social Determinants of Health (SDOH) Interventions    Readmission Risk Interventions Readmission Risk Prevention Plan 03/07/2020 01/06/2020 02/22/2019  Transportation Screening Complete Complete Complete  PCP or Specialist Appt within 3-5 Days - - -  HRI or [place] for [place] - - -  Medication Review Press photographer) Complete Complete Complete  PCP or Specialist appointment within 3-5 days of discharge Complete Complete Complete  HRI or [place] Complete Complete Complete  SW Recovery Care/Counseling Consult Complete Complete Complete  Palliative Care Screening Not Applicable Complete Not [place] Not Applicable Not Applicable Not Applicable  Some recent data might be hidden

## 2020-03-07 NOTE — Progress Notes (Signed)
PROGRESS NOTE    Regina Ortiz  RUE:[id number] DOB: 1945/10/20 DOA: 03/04/2020 PCP: Gayland Curry, DO      Brief Narrative:  Regina Ortiz is a 74 y.o. F with hx RA on Plaquenil/prednisone, A. fib on Xarelto, sCHF EF 30-35%, depression/anxiety, HTN, recent admission for GI bleed, and COPD not on O2 who presented with acute onset chest pain.  Patient was at home, doing no particular activity, when she had onset of midsternal pressure-like chest pain, moderate to severe and radiating to the throat.  In the ER, blood pressure 194/93 BNP 1072, chest x-ray clear.  She was started on IV Lasix and the hospital service was asked to evaluate for CHF flare.  Hgb was lower than on previous d/c 3 weeks ago and patient was FOBT+.          Assessment & Plan:  Hyponatremia Sodium up to 125 today.  This is asymptomatic.  Her baseline is around 130 in the context of her age, polypharmacy, and CHF. -Fluid restriction   UTI  Patient reports new onset dysuria, urinary frequency over the last several days. -Obtain urine culture and urinalysis -Start ceftriaxone  Angina Coronary artery disease, secondary prevention Troponin low and flat.  Cardiology were consulted, they noted a recent catheterization that showed heavy left main proximal LAD calcification with severe proximal LAD diagonal stenosis, mid anterior wall hypokinesis.  Cardiology suspected her angina was precipitated by anemia.  Recommended continue statin, hold aspirin due to ongoing blood losses. -Hold aspirin -Continue statin    Gastritis, chronic blood loss anemia The patient was admitted, her aspirin and Xarelto were held.  Previous work-up with EGD and capsule endoscopy were unrevealing. GI were consulted, they performed EGD that showed mild erythematous mucosa in the antrum without evidence of active bleeding, and mild erythema in the first portion of the duodenum.  -Continue PPI -Continue iron  Chronic systolic  CHF Appears euvolemic to dry on exam. Creatinine rising. -Continue Imdur -Continue Coreg, Aldactone -Hold Lasix for now, hold spiro  Hypertensive urgency Chronic hypertension Blood pressure improved -Continue Coreg, Imdur  COPD Evidence of acute exacerbation  Chronic kidney disease stage IIIb Creatinine baseline appears to be trending upward.  Chronic atrial fibrillation Balance of benefit and harm favor stopping anticoagulation. -Continue carvedilol  Stage I pressure injury, coccyx, POA  Depression/anxiety -Continue BuSpar, mirtazapine -Continue Xanax PRN  Rheumatoid arthritis -Continue Plaquenil, prednisone         Disposition: Status is: Inpatient  Remains inpatient appropriate because: new symptoms of infection, in setting of her age, co-morbid CHF EF 30%, rheumatoid on prednisone, Afib, renal failure and anemia increase the likelihood of an adverse outcome, including readmission, debility or death if the patient were to be discharged prematurely.     Dispo: The patient is from: Home              Anticipated d/c is to: Home              Anticipated d/c date is: 1 day              Patient currently is not medically stable to d/c.              MDM: The below labs and imaging reports were reviewed and summarized above.  Medication management as above.  Anticoagulant managed.    DVT prophylaxis: SCDs Start: 03/04/20 0526  Code Status: FULL Family Communication: Attempted call to Son, left VM    Consultants:   Cardiology  GI  Procedures:   EGD 7/5 Findings: Normal proximal, mid and distal esophagus. Widely patent Schatzki's ring. Small hiatal hernia. Mild erythematous mucosa in the antrum no evidence of active bleeding. Normal retroflexion. Normal duodenal bulb. Mild erythema noted in first portion of duodenum with normal-appearing second portion of duodenum.   Recommendations: Resume regular diet. As per cardiology  evaluation yesterday okay to take patient off Xarelto due to evidence of recurrent anemia. Patient to follow-up with me in the office in the next 4 to 6 weeks.  Antimicrobials:   Ceftriaxone 7.7>>   Culture data:   Urine culture 7/7           Subjective: Feeling very weak.  Has suprapubic pain and dysuria, and urinary frequency.  No fever.  No vomiting.  No confusion.  No chest pain or leg swelling.  Objective: Vitals:   03/06/20 1639 03/06/20 1924 03/07/20 0348 03/07/20 0855  BP: (!) 145/78 105/68 135/76 (!) 104/54  Pulse: 70 72 70 74  Resp:  20 18   Temp:  98.3 F (36.8 C) 98.2 F (36.8 C)   TempSrc:  Oral Oral   SpO2: 100% 100% 100% 99%  Weight:   41.8 kg   Height:        Intake/Output Summary (Last 24 hours) at 03/07/2020 1021 Last data filed at 03/07/2020 0900 Gross per 24 hour  Intake 1200 ml  Output 1700 ml  Net -500 ml   Filed Weights   03/05/20 0728 03/06/20 0311 03/07/20 0348  Weight: 41.6 kg 42.3 kg 41.8 kg    Examination: General appearance: Thin elderly chronically ill-appearing adult female, alert and in no acute distress.  Lying in bed, appears listless. HEENT: Anicteric, conjunctiva pink, lids and lashes normal. No nasal deformity, discharge, epistaxis.  Lips moist, dentures in place, oropharynx moist, no oral lesions, hearing normal.   Skin: Warm and dry.  Diffuse ecchymosis, no jaundice.  No suspicious rashes or lesions. Cardiac: RRR, nl S1-S2, no murmurs appreciated.  Capillary refill is brisk.  JVP not visible.  No LE edema.  Radial pulses 2+ and symmetric. Respiratory: Normal respiratory rate and rhythm.  CTAB without rales or wheezes. Abdomen: Abdomen soft.  Mild suprapubic TTP no guarding. No ascites, distension, hepatosplenomegaly.   MSK: No deformities or effusions.  Diffuse loss of subcutaneous muscle mass and fat. Neuro: Awake and alert.  EOMI, moves all extremities with advanced generalized weakness. Speech fluent.    Psych: Sensorium  intact and responding to questions, attention normal. Affect normal.  Judgment and insight appear normal.    Data Reviewed: I have personally reviewed following labs and imaging studies:  CBC: Recent Labs  Lab 03/04/20 0117 03/04/20 1015 03/05/20 0158 03/06/20 0425 03/07/20 0834  WBC 7.5 8.9 9.1 6.9 8.4  NEUTROABS 4.8  --   --  4.5 6.4  HGB 8.7* 10.1* 9.8* 8.7* 9.1*  HCT 26.1* 29.9* 28.4* 25.7* 27.2*  MCV 94.2 93.1 93.4 92.4 94.8  PLT 164 182 181 170 540   Basic Metabolic Panel: Recent Labs  Lab 03/05/20 0158 03/05/20 0158 03/05/20 1227 03/06/20 0425 03/06/20 1230 03/06/20 1500 03/07/20 0834  NA 123*   < > 125* 122* 121* 125* 127*  K 4.4  --   --  4.1 3.9 3.8 3.4*  CL 93*  --   --  93* 92* 94* 95*  CO2 23  --   --  21* 20* 23 22  GLUCOSE 111*  --   --  109* 106* 126* 170*  BUN 25*  --   --  30* 28* 27* 26*  CREATININE 1.40*  --   --  1.61* 1.64* 1.71* 1.57*  CALCIUM 7.9*  --   --  8.2* 8.1* 8.0* 8.2*  MG  --   --   --  1.9  --   --  1.7  PHOS  --   --   --  3.3  --   --  2.3*   < > = values in this interval not displayed.   GFR: Estimated Creatinine Clearance: 20.4 mL/min (A) (by C-G formula based on SCr of 1.57 mg/dL (H)). Liver Function Tests: Recent Labs  Lab 03/04/20 0117 03/04/20 1015 03/05/20 0158 03/06/20 0425 03/07/20 0834  AST 28 29 24 22 25   ALT 31 33 28 26 26   ALKPHOS 39 41 38 35* 39  BILITOT 0.4 0.6 0.8 0.6 0.7  PROT 5.2* 6.0* 5.1* 4.9* 5.1*  ALBUMIN 2.7* 3.1* 2.6* 2.6* 2.7*   No results for input(s): LIPASE, AMYLASE in the last 168 hours. No results for input(s): AMMONIA in the last 168 hours. Coagulation Profile: Recent Labs  Lab 03/04/20 0313 03/05/20 0158  INR 1.8* 1.2   Cardiac Enzymes: No results for input(s): CKTOTAL, CKMB, CKMBINDEX, TROPONINI in the last 168 hours. BNP (last 3 results) No results for input(s): PROBNP in the last 8760 hours. HbA1C: No results for input(s): HGBA1C in the last 72 hours. CBG: No results for  input(s): GLUCAP in the last 168 hours. Lipid Profile: No results for input(s): CHOL, HDL, LDLCALC, TRIG, CHOLHDL, LDLDIRECT in the last 72 hours. Thyroid Function Tests: Recent Labs    03/06/20 1507  TSH 1.040   Anemia Panel: No results for input(s): VITAMINB12, FOLATE, FERRITIN, TIBC, IRON, RETICCTPCT in the last 72 hours. Urine analysis:    Component Value Date/Time   COLORURINE YELLOW 02/12/2020 1847   APPEARANCEUR HAZY (A) 02/12/2020 1847   LABSPEC 1.003 (L) 02/12/2020 1847   PHURINE 6.0 02/12/2020 1847   GLUCOSEU NEGATIVE 02/12/2020 1847   HGBUR SMALL (A) 02/12/2020 1847   BILIRUBINUR NEGATIVE 02/12/2020 1847   KETONESUR NEGATIVE 02/12/2020 1847   PROTEINUR NEGATIVE 02/12/2020 1847   UROBILINOGEN 0.2 09/22/2012 1100   NITRITE NEGATIVE 02/12/2020 1847   LEUKOCYTESUR LARGE (A) 02/12/2020 1847   Sepsis Labs: @LABRCNTIP (procalcitonin:4,lacticacidven:4)  ) Recent Results (from the past 240 hour(s))  SARS Coronavirus 2 by RT PCR (hospital order, performed in [place] hospital lab) Nasopharyngeal Nasopharyngeal Swab     Status: None   Collection Time: 03/04/20  4:31 AM   Specimen: Nasopharyngeal Swab  Result Value Ref Range Status   SARS Coronavirus 2 NEGATIVE NEGATIVE Final    Comment: (NOTE) SARS-CoV-2 target nucleic acids are NOT DETECTED.  The SARS-CoV-2 RNA is generally detectable in upper and lower respiratory specimens during the acute phase of infection. The lowest concentration of SARS-CoV-2 viral copies this assay can detect is 250 copies / mL. A negative result does not preclude SARS-CoV-2 infection and should not be used as the sole basis for treatment or other patient management decisions.  A negative result may occur with improper specimen collection / handling, submission of specimen other than nasopharyngeal swab, presence of viral mutation(s) within the areas targeted by this assay, and inadequate number of viral copies (<250 copies / mL). A  negative result must be combined with clinical observations, patient history, and epidemiological information.  Fact Sheet for Patients:   StrictlyIdeas.no  Fact Sheet for Healthcare Providers: BankingDealers.co.za  This test is not  yet approved or  cleared by the [place] and has been authorized for detection and/or diagnosis of SARS-CoV-2 by FDA under an Emergency Use Authorization (EUA).  This EUA will remain in effect (meaning this test can be used) for the duration of the COVID-19 declaration under Section 564(b)(1) of the Act, 21 U.S.C. section 360bbb-3(b)(1), unless the authorization is terminated or revoked sooner.  Performed at [facility], [place] [address] [place], [place] [address]          Radiology Studies: DG CHEST PORT 1 VIEW  Result Date: 03/06/2020 CLINICAL DATA:  Shortness of breath. EXAM: PORTABLE CHEST 1 VIEW COMPARISON:  March 04, 2020. FINDINGS: Stable cardiomegaly. No pneumothorax or pleural effusion is noted. Both lungs are clear. The visualized skeletal structures are unremarkable. IMPRESSION: No active disease. Aortic Atherosclerosis (ICD10-I70.0). Electronically Signed   By: Marijo Conception M.D.   On: 03/06/2020 10:26        Scheduled Meds: . atorvastatin  80 mg Oral Daily  . busPIRone  30 mg Oral BID  . calcium carbonate  500 mg of elemental calcium Oral Daily  . carvedilol  12.5 mg Oral BID WC  . ferrous sulfate  325 mg Oral Q breakfast  . furosemide  40 mg Oral Daily  . hydroxychloroquine  200 mg Oral Daily  . isosorbide mononitrate  15 mg Oral Daily  . mirtazapine  7.5 mg Oral QHS  . multivitamin with minerals  1 tablet Oral Daily  . pantoprazole  40 mg Oral Daily  . predniSONE  5 mg Oral Daily  . saccharomyces boulardii  250 mg Oral BID   Continuous Infusions:   LOS: 2 days    Time spent: 35 minutes    Edwin Dada, MD [facility] 03/07/2020, 10:21 AM      Please page though [place] or Epic secure chat:  For [employer], Adult nurse

## 2020-03-07 NOTE — Plan of Care (Signed)
  Problem: Activity: Goal: Risk for activity intolerance will decrease Outcome: Progressing   Problem: Safety: Goal: Ability to remain free from injury will improve Outcome: Progressing   

## 2020-03-08 ENCOUNTER — Telehealth: Payer: Self-pay | Admitting: *Deleted

## 2020-03-08 LAB — CBC
HCT: 23.8 % — ABNORMAL LOW (ref 36.0–46.0)
Hemoglobin: 8.1 g/dL — ABNORMAL LOW (ref 12.0–15.0)
MCH: 32.4 pg (ref 26.0–34.0)
MCHC: 34 g/dL (ref 30.0–36.0)
MCV: 95.2 fL (ref 80.0–100.0)
Platelets: 161 10*3/uL (ref 150–400)
RBC: 2.5 MIL/uL — ABNORMAL LOW (ref 3.87–5.11)
RDW: 17.2 % — ABNORMAL HIGH (ref 11.5–15.5)
WBC: 6.9 10*3/uL (ref 4.0–10.5)
nRBC: 0 % (ref 0.0–0.2)

## 2020-03-08 LAB — COMPREHENSIVE METABOLIC PANEL
ALT: 23 U/L (ref 0–44)
AST: 22 U/L (ref 15–41)
Albumin: 2.4 g/dL — ABNORMAL LOW (ref 3.5–5.0)
Alkaline Phosphatase: 35 U/L — ABNORMAL LOW (ref 38–126)
Anion gap: 8 (ref 5–15)
BUN: 25 mg/dL — ABNORMAL HIGH (ref 8–23)
CO2: 21 mmol/L — ABNORMAL LOW (ref 22–32)
Calcium: 8.1 mg/dL — ABNORMAL LOW (ref 8.9–10.3)
Chloride: 97 mmol/L — ABNORMAL LOW (ref 98–111)
Creatinine, Ser: 1.54 mg/dL — ABNORMAL HIGH (ref 0.44–1.00)
GFR calc Af Amer: 38 mL/min — ABNORMAL LOW (ref >60)
GFR calc non Af Amer: 33 mL/min — ABNORMAL LOW (ref >60)
Glucose, Bld: 105 mg/dL — ABNORMAL HIGH (ref 70–99)
Potassium: 4.7 mmol/L (ref 3.5–5.1)
Sodium: 126 mmol/L — ABNORMAL LOW (ref 135–145)
Total Bilirubin: 0.8 mg/dL (ref 0.3–1.2)
Total Protein: 4.8 g/dL — ABNORMAL LOW (ref 6.5–8.1)

## 2020-03-08 MED ORDER — CEPHALEXIN 500 MG PO CAPS: 500.0000 mg | ORAL_CAPSULE | Freq: Two times a day (BID) | ORAL | 0 refills | Status: AC

## 2020-03-08 MED ORDER — ISOSORBIDE MONONITRATE ER 30 MG PO TB24: 15.0000 mg | ORAL_TABLET | Freq: Every day | ORAL | 3 refills | Status: AC

## 2020-03-08 MED ORDER — CARVEDILOL 12.5 MG PO TABS: 12.5000 mg | ORAL_TABLET | Freq: Two times a day (BID) | ORAL | 3 refills | Status: DC

## 2020-03-08 MED FILL — ISOSORBIDE MN ER 30 MG TAB: 30 | 60 days supply | Qty: 30 | Fill #0

## 2020-03-08 MED FILL — CEPHALEXIN 500 MG CAPS: 500 | 3 days supply | Qty: 6 | Fill #0

## 2020-03-08 NOTE — Telephone Encounter (Signed)
Transition Care Management Follow-Up Telephone Call   Date discharged and where: 03/08/2020 [place]  How have you been since you were released from the hospital? Better but tired  Any patient concerns? No Concerns  Items Reviewed:   Meds: Yes  Allergies: Yes, none new  Dietary Changes Reviewed: Yes  Functional Questionnaire:  Independent-I Dependent-D  ADLs: I with assistance   Dressing- I    Eating-I   Maintaining continence-I   Transferring-I with assistance, walker and rollator   Transportation-Has Transportation   Meal Prep-D Caregivers   Managing Meds- D Caregivers  Confirmed importance and Date/Time of follow-up visits scheduled: 03/16/2020 with [facility]   Confirmed with patient if condition worsens to call PCP or go to the Emergency Dept. Patient was given office number and encouraged to call back with questions or concerns:  Yes

## 2020-03-08 NOTE — Progress Notes (Signed)
PT Cancellation Note  Patient Details Name: Regina Ortiz MRN: [id number] DOB: 1946/07/07   Cancelled Treatment:    Reason Eval/Treat Not Completed: Other (comment) (Pt d/c home and nurse told PT not to work with pt. )   Godfrey Pick [place] 03/08/2020, 11:06 AM Trevis Eden W,PT [place] Pager:  [phone]  Office:  [phone]

## 2020-03-08 NOTE — Care Management Important Message (Signed)
Important Message  Patient Details  Name: Regina Ortiz MRN: [id number] Date of Birth: [birth year]   Medicare Important Message Given:  Yes     Shelda Altes 03/08/2020, 9:32 AM

## 2020-03-08 NOTE — Telephone Encounter (Deleted)
Transition Care Management Follow-Up Telephone Call   Date discharged and where:  How have you been since you were released from the hospital?   Any patient concerns?   Items Reviewed:   Meds:   Allergies:  Dietary Changes Reviewed:  Functional Questionnaire:  Independent-I Dependent-D  ADLs:   Dressing-     Eating-   Maintaining continence-   Transferring-   Transportation-   Meal Prep-   Managing Meds-   Confirmed importance and Date/Time of follow-up visits scheduled:   Confirmed with patient if condition worsens to call PCP or go to the Emergency Dept. Patient was given office number and encouraged to call back with questions or concerns:

## 2020-03-08 NOTE — Progress Notes (Signed)
Nurse spoke with pt's son Shanon Brow regarding pt's discharging plan.   He stated that pt's caregiver will be waiting for pt at home and that she can take a taxi.   Taxi voucher given to pt and transition of care pharmacy delivering medication to pt room.

## 2020-03-08 NOTE — Discharge Summary (Signed)
Physician Discharge Summary  Regina Ortiz VXB:[id number] DOB: 02/28/1946 DOA: 03/04/2020  PCP: Gayland Curry, DO  Admit date: 03/04/2020 Discharge date: 03/08/2020  Admitted From: Home with [place] Disposition:  Home with [facility]   Recommendations for Outpatient Follow-up:  1. Follow up with PCP Dr. Mariea Clonts in 1 week 2. Dr. Mariea Clonts: 1. Xarelto and aspirin have been stopped 2. Please obtain CBC to monitor Hgb in 1 week 3. Please refer to Palliative Care if not done already 4. Please obtain BMP to monitor Na in 1 week 3. Follow up with [place]: PT/OT due to shortness of breath with exertion  Equipment/Devices: Walker  Discharge Condition: Declining CODE STATUS: FULL Diet recommendation: Cardiac  Brief/Interim Summary: [name] is a 74 y.o. F with hx RA on Plaquenil/prednisone, A. fib on Xarelto, sCHF EF 30-35%, depression/anxiety, HTN, recent admission for GI bleed, and COPD not on O2 who presented with acute onset chest pain.  Patient was at home, doing no particular activity, when she had onset of midsternal pressure-like chest pain, moderate to severe and radiating to the throat.  In the ER, blood pressure 194/93 BNP 1072, chest x-ray clear.  She was started on IV Lasix and the hospital service was asked to evaluate for CHF flare.  Hgb was lower than on previous d/c 3 weeks ago and patient was FOBT+.     PRINCIPAL HOSPITAL DIAGNOSIS: Angina    Discharge Diagnoses:    Angina Coronary artery disease, secondary prevention Troponin low and flat.  Cardiology were consulted, they noted a recent catheterization that showed heavy left main proximal LAD calcification with severe proximal LAD diagonal stenosis, mid anterior wall hypokinesis.  Cardiology suspected her angina was precipitated by anemia.  Recommended continue statin, hold aspirin due to ongoing blood losses. Start Imdur.     Gastritis, chronic blood loss anemia The patient was admitted, her  aspirin and Xarelto were held.  Previous work-up with EGD and capsule endoscopy were unrevealing of source.   GI were consulted, they performed EGD that showed mild erythematous mucosa in the antrum without evidence of active bleeding, and mild erythema in the first portion of the duodenum.   Her Xarelto and aspirin were discontinued.  Her bowel movements had no further evidence of bleeding.     Hyponatremia Baseline Na around 130.  Asymptomatic at 125, no further treatment recommended.    UTI  Patient had new onset dysuria, urinary frequency in the hospital.  Ceftriaxone x2 given, discharged to complete cephalexin 500 BID for total 5 days.    Chronic systolic CHF Continue Imdur, Coreg, Aldactone.  Resume diuretics in 1 week.   Hypertensive urgency Chronic hypertension Hypertensive urgency resolved with home meds.  Continue Coreg, Imdur  COPD No evidence of acute exacerbation  Chronic kidney disease stage IIIb Creatinine baseline appears to be trending upward.  Chronic atrial fibrillation Balance of benefit and harm favor stopping anticoagulation.  Stage I pressure injury, coccyx, POA  Depression/anxiety This appears to be the patient's chief problem.  Continue BuSpar, mirtazapine, Xanax.   Rheumatoid arthritis Continue Plaquenil, prednisone               Discharge Instructions  Discharge Instructions    Diet - low sodium heart healthy   Complete by: As directed    Discharge instructions   Complete by: As directed    From Dr. Loleta Books: You were admitted for chest pain. To [place], this appears to be from blockages  in your heart.   They cardiologists, my partners and I feel these are not fixable with procedures or surgery.  We can try to improve the symptoms with medicines (like Imdur) but likely can't "cure" this problem.  Take isosorbide mononitrate/Imdur 30 mg once daily  Follow up with Dr. Kandis Mannan office in 1 month  REDUCE your carvedilol  dose to 12.5 mg twice daily REDUCE your spironolactone dose to 12.5 my once daily  Continue Lasix, but HOLD the Lasix (do not take it) for 5 days (until Sunday) Call Dr. Mariea Clonts for a follow up appointment in *1 week*   For the bladder infection: Take cephalexin/Keflex 500 mg twice daily for 3 more days starting Friday   STOP aspirin and Xarelto  Have Dr. Mariea Clonts check your labs in 1 week   Increase activity slowly   Complete by: As directed      Allergies as of 03/08/2020      Reactions   Latex Swelling, Other (See Comments)    fever blisters   Augmentin [amoxicillin-pot Clavulanate] Nausea And Vomiting   Doxycycline Other (See Comments)   Blurred vision, nervous, unsteady   Hydrocodone Itching   Acetaminophen:itching, confusion, hallucinations-side effects-Onset date 07/15/2018   Lactose Intolerance (gi) Other (See Comments)   Unknown, per pt   Lialda [mesalamine] Diarrhea   Prinivil [lisinopril] Diarrhea   Triamcinolone Acetonide Other (See Comments)   REDNESS AND PAIN   Amlodipine Rash   Lotensin [benazepril Hcl] Rash      Medication List    STOP taking these medications   aspirin 81 MG chewable tablet   Rivaroxaban 15 MG Tabs tablet Commonly known as: XARELTO     TAKE these medications   acetaminophen 325 MG tablet Commonly known as: TYLENOL Take 650 mg by mouth every 6 (six) hours as needed for mild pain.   ALPRAZolam 0.5 MG tablet Commonly known as: XANAX Take 1 tablet (0.5 mg total) by mouth 3 (three) times daily as needed for anxiety.   atorvastatin 80 MG tablet Commonly known as: LIPITOR Take 1 tablet (80 mg total) by mouth daily.   busPIRone 30 MG tablet Commonly known as: BUSPAR Take 30 mg by mouth 2 (two) times daily.   calcium carbonate 1500 (600 Ca) MG Tabs tablet Commonly known as: OSCAL Take 600 mg of elemental calcium by mouth daily.   carvedilol 12.5 MG tablet Commonly known as: COREG Take 1 tablet (12.5 mg total) by mouth 2 (two) times  daily with a meal. What changed:   medication strength  how much to take   cephALEXin 500 MG capsule Commonly known as: KEFLEX Take 1 capsule (500 mg total) by mouth 2 (two) times daily for 3 days.   denosumab 60 MG/ML Sosy injection Commonly known as: PROLIA Inject 60 mg into the skin every 6 (six) months.   ferrous sulfate 325 (65 FE) MG tablet Take 325 mg by mouth daily with breakfast.   furosemide 40 MG tablet Commonly known as: LASIX Take 1 tablet (40 mg total) by mouth daily.   hydroxychloroquine 200 MG tablet Commonly known as: PLAQUENIL Take 200 mg by mouth daily.   isosorbide mononitrate 30 MG 24 hr tablet Commonly known as: IMDUR Take 0.5 tablets (15 mg total) by mouth daily. Start taking on: March 09, 2020   loperamide 2 MG capsule Commonly known as: IMODIUM Take 1 capsule (2 mg total) by mouth every 6 (six) hours as needed for diarrhea or loose stools.   mirtazapine 7.5 MG tablet  Commonly known as: REMERON Take 1 tablet (7.5 mg total) by mouth at bedtime.   multivitamin with minerals Tabs tablet Take 1 tablet by mouth daily.   predniSONE 5 MG tablet Commonly known as: DELTASONE Take 5 mg by mouth daily.   saccharomyces boulardii 250 MG capsule Commonly known as: FLORASTOR Take 1 capsule (250 mg total) by mouth 2 (two) times daily.   spironolactone 25 MG tablet Commonly known as: ALDACTONE Take 0.5 tablets (12.5 mg total) by mouth daily. What changed: how much to take   SYSTANE FREE OP Place 1 drop into both eyes daily as needed (For Dry eyes).       Follow-up Information    [facility] Follow up.   Specialty: Home Health Services Why: HHRN,HHPT, HHOT Contact information: [address] [place] [address] [phone]        Hollace Kinnier L, DO. Schedule an appointment as soon as possible for a visit in 1 week(s).   Specialty: Geriatric Medicine Contact information: [address] [place]  [phone]              Allergies  Allergen Reactions  . Latex Swelling and Other (See Comments)     fever blisters  . Augmentin [Amoxicillin-Pot Clavulanate] Nausea And Vomiting  . Doxycycline Other (See Comments)    Blurred vision, nervous, unsteady  . Hydrocodone Itching    Acetaminophen:itching, confusion, hallucinations-side effects-Onset date 07/15/2018  . Lactose Intolerance (Gi) Other (See Comments)    Unknown, per pt  . Lialda [Mesalamine] Diarrhea  . Prinivil [Lisinopril] Diarrhea  . Triamcinolone Acetonide Other (See Comments)    REDNESS AND PAIN  . Amlodipine Rash  . Lotensin [Benazepril Hcl] Rash    Consultations:  Cardiology  GI   Procedures/Studies: DG Chest 2 View  Result Date: 02/09/2020 CLINICAL DATA:  Chest pain and shortness of breath for 1 day. EXAM: CHEST - 2 VIEW COMPARISON:  Single-view of the chest 12/31/2019. PA and lateral chest 09/13/2019. FINDINGS: Lung volumes are low but the lungs are clear. Heart size is normal. Atherosclerosis noted. No pneumothorax or pleural effusion. IMPRESSION: No acute disease. Aortic Atherosclerosis (ICD10-I70.0). Electronically Signed   By: Inge Rise M.D.   On: 02/09/2020 14:14   DG CHEST PORT 1 VIEW  Result Date: 03/06/2020 CLINICAL DATA:  Shortness of breath. EXAM: PORTABLE CHEST 1 VIEW COMPARISON:  March 04, 2020. FINDINGS: Stable cardiomegaly. No pneumothorax or pleural effusion is noted. Both lungs are clear. The visualized skeletal structures are unremarkable. IMPRESSION: No active disease. Aortic Atherosclerosis (ICD10-I70.0). Electronically Signed   By: Marijo Conception M.D.   On: 03/06/2020 10:26   DG Chest Portable 1 View  Result Date: 03/04/2020 CLINICAL DATA:  Chest pain. EXAM: PORTABLE CHEST 1 VIEW COMPARISON:  02/10/2020. FINDINGS: Cardiomegaly. Unchanged mediastinal contours with aortic atherosclerosis. Resolved interstitial opacities from prior exam. No pleural fluid or pneumothorax. No  new airspace disease. There may be trace fluid in the right minor fissure. No acute osseous abnormalities are seen. IMPRESSION: 1. Stable cardiomegaly and aortic atherosclerosis. 2. Resolved interstitial opacities from prior exam. No new findings. Electronically Signed   By: Keith Rake M.D.   On: 03/04/2020 02:25   DG Chest Port 1 View  Result Date: 02/10/2020 CLINICAL DATA:  Chest acute respiratory distress, on CPAP EXAM: PORTABLE CHEST 1 VIEW COMPARISON:  Radiograph 02/09/2020, 06/22/2012 FINDINGS: There has been rapid interval progression of the heterogeneous interstitial and airspace opacity throughout both lungs, right greater  than left with some associated interlobular septal thickening and vascular congestion. Cardiomediastinal silhouette is similar to prior accounting for differences in inflation in technique. The aorta is calcified. The remaining cardiomediastinal contours are unremarkable. Telemetry leads overlie the chest. Gaseous distention of the stomach, nonspecific. No other acute osseous or soft tissue abnormality. Degenerative changes are present in the imaged spine and shoulders. Vascular calcium in the left carotid and right axillary region. IMPRESSION: Rapid interval progression of the heterogeneous interstitial and airspace opacity throughout both lungs, right greater than left, with associated vascular congestion and interlobular septal thickening. Findings could reflect rapidly worsening and slightly asymmetric pulmonary edema or ARDS. Infection is less favored though should be excluded clinically. Electronically Signed   By: Lovena Le M.D.   On: 02/10/2020 00:55       Subjective: Patient feeling very anxious.  Very weak and tired.  No confusion, fever.  Dysuria improving.  Chest pain is intermittent.  Discharge Exam: Vitals:   03/08/20 0254 03/08/20 0921  BP: (!) 146/81 (!) 141/66  Pulse: 77 79  Resp: 19   Temp: 97.9 F (36.6 C)   SpO2: 99% 100%   Vitals:    03/07/20 1937 03/07/20 1939 03/08/20 0254 03/08/20 0921  BP: (!) 91/55 (!) 101/55 (!) 146/81 (!) 141/66  Pulse: 76 73 77 79  Resp: 18  19   Temp: 98.5 F (36.9 C)  97.9 F (36.6 C)   TempSrc: Oral  Oral   SpO2: 99%  99% 100%  Weight:   42 kg   Height:        General: Pt is alert, awake, lying in bed, appears anxious Cardiovascular: RRR, nl S1-S2, no murmurs appreciated.   No LE edema.   Respiratory: Normal respiratory rate and rhythm.  CTAB without rales or wheezes. Abdominal: Abdomen soft and non-tender.  No distension or HSM.   Neuro/Psych: Strength symmetric in upper and lower extremities.  Judgment and insight appear normal.   The results of significant diagnostics from this hospitalization (including imaging, microbiology, ancillary and laboratory) are listed below for reference.     Microbiology: Recent Results (from the past 240 hour(s))  SARS Coronavirus 2 by RT PCR (hospital order, performed in [facility] hospital lab) Nasopharyngeal Nasopharyngeal Swab     Status: None   Collection Time: 03/04/20  4:31 AM   Specimen: Nasopharyngeal Swab  Result Value Ref Range Status   SARS Coronavirus 2 NEGATIVE NEGATIVE Final    Comment: (NOTE) SARS-CoV-2 target nucleic acids are NOT DETECTED.  The SARS-CoV-2 RNA is generally detectable in upper and lower respiratory specimens during the acute phase of infection. The lowest concentration of SARS-CoV-2 viral copies this assay can detect is 250 copies / mL. A negative result does not preclude SARS-CoV-2 infection and should not be used as the sole basis for treatment or other patient management decisions.  A negative result may occur with improper specimen collection / handling, submission of specimen other than nasopharyngeal swab, presence of viral mutation(s) within the areas targeted by this assay, and inadequate number of viral copies (<250 copies / mL). A negative result must be combined with clinical observations, patient  history, and epidemiological information.  Fact Sheet for Patients:   StrictlyIdeas.no  Fact Sheet for Healthcare Providers: BankingDealers.co.za  This test is not yet approved or  cleared by the [place] FDA and has been authorized for detection and/or diagnosis of SARS-CoV-2 by FDA under an Emergency Use Authorization (EUA).  This EUA will remain in effect (  meaning this test can be used) for the duration of the COVID-19 declaration under Section 564(b)(1) of the Act, 21 U.S.C. section 360bbb-3(b)(1), unless the authorization is terminated or revoked sooner.  Performed at [facility], [address], [place], [place] [address]   Culture, Urine     Status: Abnormal (Preliminary result)   Collection Time: 03/07/20 10:40 AM   Specimen: Urine, Random  Result Value Ref Range Status   Specimen Description URINE, RANDOM  Final   Special Requests   Final    NONE Performed at [facility], [place] [address], [place], [place] [address]    Culture >=100,000 COLONIES/mL ESCHERICHIA COLI (A)  Final   Report Status PENDING  Incomplete     Labs: BNP (last 3 results) Recent Labs    12/31/19 0432 02/09/20 1317 03/04/20 0117  BNP 3,381.8* 786.6* 0,347.4*   Basic Metabolic Panel: Recent Labs  Lab 03/06/20 0425 03/06/20 1230 03/06/20 1500 03/07/20 0834 03/08/20 0504  NA 122* 121* 125* 127* 126*  K 4.1 3.9 3.8 3.4* 4.7  CL 93* 92* 94* 95* 97*  CO2 21* 20* 23 22 21*  GLUCOSE 109* 106* 126* 170* 105*  BUN 30* 28* 27* 26* 25*  CREATININE 1.61* 1.64* 1.71* 1.57* 1.54*  CALCIUM 8.2* 8.1* 8.0* 8.2* 8.1*  MG 1.9  --   --  1.7  --   PHOS 3.3  --   --  2.3*  --    Liver Function Tests: Recent Labs  Lab 03/04/20 1015 03/05/20 0158 03/06/20 0425 03/07/20 0834 03/08/20 0504  AST 29 24 22 25 22   ALT 33 28 26 26 23   ALKPHOS 41 38 35* 39 35*  BILITOT 0.6 0.8 0.6 0.7 0.8  PROT 6.0* 5.1* 4.9* 5.1* 4.8*  ALBUMIN 3.1* 2.6*  2.6* 2.7* 2.4*   No results for input(s): LIPASE, AMYLASE in the last 168 hours. No results for input(s): AMMONIA in the last 168 hours. CBC: Recent Labs  Lab 03/04/20 0117 03/04/20 0117 03/04/20 1015 03/05/20 0158 03/06/20 0425 03/07/20 0834 03/08/20 0504  WBC 7.5   < > 8.9 9.1 6.9 8.4 6.9  NEUTROABS 4.8  --   --   --  4.5 6.4  --   HGB 8.7*   < > 10.1* 9.8* 8.7* 9.1* 8.1*  HCT 26.1*   < > 29.9* 28.4* 25.7* 27.2* 23.8*  MCV 94.2   < > 93.1 93.4 92.4 94.8 95.2  PLT 164   < > 182 181 170 169 161   < > = values in this interval not displayed.   Cardiac Enzymes: No results for input(s): CKTOTAL, CKMB, CKMBINDEX, TROPONINI in the last 168 hours. BNP: Invalid input(s): POCBNP CBG: No results for input(s): GLUCAP in the last 168 hours. D-Dimer No results for input(s): DDIMER in the last 72 hours. Hgb A1c No results for input(s): HGBA1C in the last 72 hours. Lipid Profile No results for input(s): CHOL, HDL, LDLCALC, TRIG, CHOLHDL, LDLDIRECT in the last 72 hours. Thyroid function studies Recent Labs    03/06/20 1507  TSH 1.040   Anemia work up No results for input(s): VITAMINB12, FOLATE, FERRITIN, TIBC, IRON, RETICCTPCT in the last 72 hours. Urinalysis    Component Value Date/Time   COLORURINE YELLOW 03/07/2020 1025   APPEARANCEUR TURBID (A) 03/07/2020 1025   LABSPEC 1.004 (L) 03/07/2020 1025   PHURINE 5.0 03/07/2020 1025   GLUCOSEU NEGATIVE 03/07/2020 1025   HGBUR SMALL (A) 03/07/2020 1025   BILIRUBINUR NEGATIVE 03/07/2020 1025  KETONESUR NEGATIVE 03/07/2020 1025   PROTEINUR NEGATIVE 03/07/2020 1025   UROBILINOGEN 0.2 09/22/2012 1100   NITRITE NEGATIVE 03/07/2020 1025   LEUKOCYTESUR LARGE (A) 03/07/2020 1025   Sepsis Labs Invalid input(s): PROCALCITONIN,  WBC,  LACTICIDVEN Microbiology Recent Results (from the past 240 hour(s))  SARS Coronavirus 2 by RT PCR (hospital order, performed in [facility] hospital lab) Nasopharyngeal Nasopharyngeal Swab     Status:  None   Collection Time: 03/04/20  4:31 AM   Specimen: Nasopharyngeal Swab  Result Value Ref Range Status   SARS Coronavirus 2 NEGATIVE NEGATIVE Final    Comment: (NOTE) SARS-CoV-2 target nucleic acids are NOT DETECTED.  The SARS-CoV-2 RNA is generally detectable in upper and lower respiratory specimens during the acute phase of infection. The lowest concentration of SARS-CoV-2 viral copies this assay can detect is 250 copies / mL. A negative result does not preclude SARS-CoV-2 infection and should not be used as the sole basis for treatment or other patient management decisions.  A negative result may occur with improper specimen collection / handling, submission of specimen other than nasopharyngeal swab, presence of viral mutation(s) within the areas targeted by this assay, and inadequate number of viral copies (<250 copies / mL). A negative result must be combined with clinical observations, patient history, and epidemiological information.  Fact Sheet for Patients:   StrictlyIdeas.no  Fact Sheet for Healthcare Providers: BankingDealers.co.za  This test is not yet approved or  cleared by the [place] FDA and has been authorized for detection and/or diagnosis of SARS-CoV-2 by FDA under an Emergency Use Authorization (EUA).  This EUA will remain in effect (meaning this test can be used) for the duration of the COVID-19 declaration under Section 564(b)(1) of the Act, 21 U.S.C. section 360bbb-3(b)(1), unless the authorization is terminated or revoked sooner.  Performed at [facility], [place] [address], [place], [place] [address]   Culture, Urine     Status: Abnormal (Preliminary result)   Collection Time: 03/07/20 10:40 AM   Specimen: Urine, Random  Result Value Ref Range Status   Specimen Description URINE, RANDOM  Final   Special Requests   Final    NONE Performed at [facility], [place] [address],  [place], [place] [address]    Culture >=100,000 COLONIES/mL ESCHERICHIA COLI (A)  Final   Report Status PENDING  Incomplete     Time coordinating discharge: 35 minutes The  controlled substances registry was reviewed for this patient prior to filling the <5 days supply controlled substances script.      SIGNED:   Edwin Dada, MD  [facility] 03/08/2020, 9:32 PM

## 2020-03-08 NOTE — TOC Transition Note (Signed)
Transition of Care [facility]) - CM/SW Discharge Note   Patient Details  Name: Regina Ortiz MRN: [id number] Date of Birth: 12-18-1945  [facility] CM/SW Contact:  Zenon Mayo, RN Phone Number: 03/08/2020, 9:17 AM   Clinical Narrative:    Patient for possible dc today, she will need transport, NCM gave voucher to Staff RN.  NCM notified [name] with [name] of dc today.     Final next level of care: [place] Barriers to Discharge: No Barriers Identified   Patient Goals and CMS Choice Patient states their goals for this hospitalization and ongoing recovery are:: get better CMS Medicare.gov Compare Post Acute Care list provided to:: Patient Choice offered to / list presented to : Patient  Discharge Placement                       Discharge Plan and Services   Discharge Planning Services: CM Consult Post Acute Care Choice: Home Health            DME Agency: NA       HH Arranged: RN, PT, Disease Management [place] Agency: [place] Date [facility] Agency Contacted: 03/07/20 Time [place]: 6579 Representative spoke with at [place]: [place] (SDOH) Interventions Social Connections Interventions: Intervention Not Indicated Transportation Interventions: Intervention Not Indicated   Readmission Risk Interventions Readmission Risk Prevention Plan 03/07/2020 01/06/2020 02/22/2019  Transportation Screening Complete Complete Complete  PCP or Specialist Appt within 3-5 Days - - -  HRI or [place] Work Consult for [place] - - -  Medication Review Press photographer) Complete Complete Complete  PCP or Specialist appointment within 3-5 days of discharge Complete Complete Complete  HRI or [place] Complete Complete Complete  SW Recovery Care/Counseling Consult Complete Complete Complete  Palliative Care  Screening Not Applicable Complete Not [place] Not Applicable Not Applicable Not Applicable  Some recent data might be hidden

## 2020-03-09 DIAGNOSIS — I4891 Unspecified atrial fibrillation: Secondary | ICD-10-CM | POA: Diagnosis not present

## 2020-03-09 DIAGNOSIS — J441 Chronic obstructive pulmonary disease with (acute) exacerbation: Secondary | ICD-10-CM | POA: Diagnosis not present

## 2020-03-09 DIAGNOSIS — M5441 Lumbago with sciatica, right side: Secondary | ICD-10-CM | POA: Diagnosis not present

## 2020-03-09 DIAGNOSIS — N183 Chronic kidney disease, stage 3 unspecified: Secondary | ICD-10-CM | POA: Diagnosis not present

## 2020-03-09 DIAGNOSIS — N179 Acute kidney failure, unspecified: Secondary | ICD-10-CM | POA: Diagnosis not present

## 2020-03-09 DIAGNOSIS — M5417 Radiculopathy, lumbosacral region: Secondary | ICD-10-CM | POA: Diagnosis not present

## 2020-03-09 DIAGNOSIS — I6523 Occlusion and stenosis of bilateral carotid arteries: Secondary | ICD-10-CM | POA: Diagnosis not present

## 2020-03-09 DIAGNOSIS — M810 Age-related osteoporosis without current pathological fracture: Secondary | ICD-10-CM | POA: Diagnosis not present

## 2020-03-09 DIAGNOSIS — D7589 Other specified diseases of blood and blood-forming organs: Secondary | ICD-10-CM | POA: Diagnosis not present

## 2020-03-09 DIAGNOSIS — N319 Neuromuscular dysfunction of bladder, unspecified: Secondary | ICD-10-CM | POA: Diagnosis not present

## 2020-03-09 DIAGNOSIS — D631 Anemia in chronic kidney disease: Secondary | ICD-10-CM | POA: Diagnosis not present

## 2020-03-09 DIAGNOSIS — I5043 Acute on chronic combined systolic (congestive) and diastolic (congestive) heart failure: Secondary | ICD-10-CM | POA: Diagnosis not present

## 2020-03-09 DIAGNOSIS — M170 Bilateral primary osteoarthritis of knee: Secondary | ICD-10-CM | POA: Diagnosis not present

## 2020-03-09 DIAGNOSIS — M0600 Rheumatoid arthritis without rheumatoid factor, unspecified site: Secondary | ICD-10-CM | POA: Diagnosis not present

## 2020-03-09 DIAGNOSIS — F329 Major depressive disorder, single episode, unspecified: Secondary | ICD-10-CM | POA: Diagnosis not present

## 2020-03-09 DIAGNOSIS — J9601 Acute respiratory failure with hypoxia: Secondary | ICD-10-CM | POA: Diagnosis not present

## 2020-03-09 DIAGNOSIS — F419 Anxiety disorder, unspecified: Secondary | ICD-10-CM | POA: Diagnosis not present

## 2020-03-09 DIAGNOSIS — I447 Left bundle-branch block, unspecified: Secondary | ICD-10-CM | POA: Diagnosis not present

## 2020-03-09 DIAGNOSIS — M8580 Other specified disorders of bone density and structure, unspecified site: Secondary | ICD-10-CM | POA: Diagnosis not present

## 2020-03-09 DIAGNOSIS — I7300 Raynaud's syndrome without gangrene: Secondary | ICD-10-CM | POA: Diagnosis not present

## 2020-03-09 DIAGNOSIS — I4892 Unspecified atrial flutter: Secondary | ICD-10-CM | POA: Diagnosis not present

## 2020-03-09 DIAGNOSIS — I088 Other rheumatic multiple valve diseases: Secondary | ICD-10-CM | POA: Diagnosis not present

## 2020-03-09 DIAGNOSIS — G834 Cauda equina syndrome: Secondary | ICD-10-CM | POA: Diagnosis not present

## 2020-03-09 DIAGNOSIS — I0981 Rheumatic heart failure: Secondary | ICD-10-CM | POA: Diagnosis not present

## 2020-03-09 DIAGNOSIS — I130 Hypertensive heart and chronic kidney disease with heart failure and stage 1 through stage 4 chronic kidney disease, or unspecified chronic kidney disease: Secondary | ICD-10-CM | POA: Diagnosis not present

## 2020-03-09 LAB — URINE CULTURE: Culture: >=100000 — AB

## 2020-03-09 NOTE — Progress Notes (Signed)
Appears Ereka was d/c'd home and now urine culture has returned.  It did grow out E coli.  I see that she is completing a course of keflex and the bacteria should be killed off by that antibiotic.  Please notify her caregiver.

## 2020-03-10 DIAGNOSIS — I25119 Atherosclerotic heart disease of native coronary artery with unspecified angina pectoris: Secondary | ICD-10-CM | POA: Diagnosis not present

## 2020-03-10 DIAGNOSIS — I130 Hypertensive heart and chronic kidney disease with heart failure and stage 1 through stage 4 chronic kidney disease, or unspecified chronic kidney disease: Secondary | ICD-10-CM | POA: Diagnosis not present

## 2020-03-10 DIAGNOSIS — I5043 Acute on chronic combined systolic (congestive) and diastolic (congestive) heart failure: Secondary | ICD-10-CM

## 2020-03-10 DIAGNOSIS — D631 Anemia in chronic kidney disease: Secondary | ICD-10-CM

## 2020-03-10 DIAGNOSIS — I0981 Rheumatic heart failure: Secondary | ICD-10-CM

## 2020-03-10 DIAGNOSIS — N1832 Chronic kidney disease, stage 3b: Secondary | ICD-10-CM

## 2020-03-10 DIAGNOSIS — D62 Acute posthemorrhagic anemia: Secondary | ICD-10-CM | POA: Diagnosis not present

## 2020-03-10 DIAGNOSIS — K922 Gastrointestinal hemorrhage, unspecified: Secondary | ICD-10-CM | POA: Diagnosis not present

## 2020-03-13 DIAGNOSIS — M8000XD Age-related osteoporosis with current pathological fracture, unspecified site, subsequent encounter for fracture with routine healing: Secondary | ICD-10-CM | POA: Diagnosis not present

## 2020-03-14 ENCOUNTER — Telehealth: Payer: Self-pay

## 2020-03-14 DIAGNOSIS — I4892 Unspecified atrial flutter: Secondary | ICD-10-CM | POA: Diagnosis not present

## 2020-03-14 DIAGNOSIS — M810 Age-related osteoporosis without current pathological fracture: Secondary | ICD-10-CM | POA: Diagnosis not present

## 2020-03-14 DIAGNOSIS — K6282 Dysplasia of anus: Secondary | ICD-10-CM | POA: Diagnosis not present

## 2020-03-14 DIAGNOSIS — I130 Hypertensive heart and chronic kidney disease with heart failure and stage 1 through stage 4 chronic kidney disease, or unspecified chronic kidney disease: Secondary | ICD-10-CM | POA: Diagnosis not present

## 2020-03-14 DIAGNOSIS — J441 Chronic obstructive pulmonary disease with (acute) exacerbation: Secondary | ICD-10-CM | POA: Diagnosis not present

## 2020-03-14 DIAGNOSIS — I447 Left bundle-branch block, unspecified: Secondary | ICD-10-CM | POA: Diagnosis not present

## 2020-03-14 DIAGNOSIS — G834 Cauda equina syndrome: Secondary | ICD-10-CM | POA: Diagnosis not present

## 2020-03-14 DIAGNOSIS — M0600 Rheumatoid arthritis without rheumatoid factor, unspecified site: Secondary | ICD-10-CM | POA: Diagnosis not present

## 2020-03-14 DIAGNOSIS — M8580 Other specified disorders of bone density and structure, unspecified site: Secondary | ICD-10-CM | POA: Diagnosis not present

## 2020-03-14 DIAGNOSIS — N1832 Chronic kidney disease, stage 3b: Secondary | ICD-10-CM | POA: Diagnosis not present

## 2020-03-14 DIAGNOSIS — I7300 Raynaud's syndrome without gangrene: Secondary | ICD-10-CM | POA: Diagnosis not present

## 2020-03-14 DIAGNOSIS — M170 Bilateral primary osteoarthritis of knee: Secondary | ICD-10-CM | POA: Diagnosis not present

## 2020-03-14 DIAGNOSIS — M5441 Lumbago with sciatica, right side: Secondary | ICD-10-CM | POA: Diagnosis not present

## 2020-03-14 DIAGNOSIS — D631 Anemia in chronic kidney disease: Secondary | ICD-10-CM | POA: Diagnosis not present

## 2020-03-14 DIAGNOSIS — K922 Gastrointestinal hemorrhage, unspecified: Secondary | ICD-10-CM | POA: Diagnosis not present

## 2020-03-14 DIAGNOSIS — F329 Major depressive disorder, single episode, unspecified: Secondary | ICD-10-CM | POA: Diagnosis not present

## 2020-03-14 DIAGNOSIS — I482 Chronic atrial fibrillation, unspecified: Secondary | ICD-10-CM | POA: Diagnosis not present

## 2020-03-14 DIAGNOSIS — I0981 Rheumatic heart failure: Secondary | ICD-10-CM | POA: Diagnosis not present

## 2020-03-14 DIAGNOSIS — K529 Noninfective gastroenteritis and colitis, unspecified: Secondary | ICD-10-CM | POA: Diagnosis not present

## 2020-03-14 DIAGNOSIS — F419 Anxiety disorder, unspecified: Secondary | ICD-10-CM | POA: Diagnosis not present

## 2020-03-14 DIAGNOSIS — M5417 Radiculopathy, lumbosacral region: Secondary | ICD-10-CM | POA: Diagnosis not present

## 2020-03-14 DIAGNOSIS — I25119 Atherosclerotic heart disease of native coronary artery with unspecified angina pectoris: Secondary | ICD-10-CM | POA: Diagnosis not present

## 2020-03-14 DIAGNOSIS — D62 Acute posthemorrhagic anemia: Secondary | ICD-10-CM | POA: Diagnosis not present

## 2020-03-14 DIAGNOSIS — I088 Other rheumatic multiple valve diseases: Secondary | ICD-10-CM | POA: Diagnosis not present

## 2020-03-14 DIAGNOSIS — I5043 Acute on chronic combined systolic (congestive) and diastolic (congestive) heart failure: Secondary | ICD-10-CM | POA: Diagnosis not present

## 2020-03-14 NOTE — Telephone Encounter (Signed)
I recommend that we continue to monitor her bp at least an hour after her medications twice a day until her appt.  If she has symptoms associated with the high bp, home health should call us back.

## 2020-03-14 NOTE — Telephone Encounter (Signed)
Agapito Games with [name] called  About Ms. Runkles having high blood pressure yesterday and today. The reading yesterday was 170/90. Today it was 180/100 patient stated that she felt anxious so Shraddha instructed her to take Xanax. 15-20 minutes after taking the Xanax BP reading was 170/90. Patient has TOC visit with Dinah on Friday 03/16/2020. Please advise.

## 2020-03-15 NOTE — Telephone Encounter (Addendum)
Spoke with Agapito Games with [facility] and told her that Dr. Mariea Clonts  recommends that we continue to monitor her bp at least an hour after her medications twice a day until her appt.  If she has symptoms associated with the high bp, home health should call us back.  She understood and agreed.

## 2020-03-15 NOTE — Addendum Note (Signed)
Addended by: Carroll Kinds on: 03/15/2020 04:10 PM   Modules accepted: Level of Service

## 2020-03-16 ENCOUNTER — Other Ambulatory Visit: Payer: Self-pay

## 2020-03-16 ENCOUNTER — Ambulatory Visit (INDEPENDENT_AMBULATORY_CARE_PROVIDER_SITE_OTHER): Payer: PPO | Admitting: Family

## 2020-03-16 ENCOUNTER — Encounter: Payer: Self-pay | Admitting: Family

## 2020-03-16 VITALS — BP 136/84 | HR 78 | Temp 96.8°F | Ht 65.0 in | Wt 96.0 lb

## 2020-03-16 DIAGNOSIS — L89151 Pressure ulcer of sacral region, stage 1: Secondary | ICD-10-CM | POA: Diagnosis not present

## 2020-03-16 DIAGNOSIS — F5105 Insomnia due to other mental disorder: Secondary | ICD-10-CM

## 2020-03-16 DIAGNOSIS — I10 Essential (primary) hypertension: Secondary | ICD-10-CM

## 2020-03-16 DIAGNOSIS — F409 Phobic anxiety disorder, unspecified: Secondary | ICD-10-CM

## 2020-03-16 DIAGNOSIS — F321 Major depressive disorder, single episode, moderate: Secondary | ICD-10-CM

## 2020-03-16 DIAGNOSIS — F411 Generalized anxiety disorder: Secondary | ICD-10-CM | POA: Diagnosis not present

## 2020-03-16 DIAGNOSIS — F331 Major depressive disorder, recurrent, moderate: Secondary | ICD-10-CM | POA: Diagnosis not present

## 2020-03-16 DIAGNOSIS — I5042 Chronic combined systolic (congestive) and diastolic (congestive) heart failure: Secondary | ICD-10-CM

## 2020-03-16 MED ORDER — MIRTAZAPINE 15 MG PO TABS: 15.0000 mg | ORAL_TABLET | Freq: Every day | ORAL | 0 refills | Status: DC

## 2020-03-16 NOTE — Addendum Note (Signed)
Addended by: Carroll Kinds on: 03/16/2020 03:17 PM   Modules accepted: Level of Service

## 2020-03-16 NOTE — Patient Instructions (Signed)
-   check B/p once daily and record on log.Notify provider if B/p > 140/90  - Take Remeron 15 mg tablet one by mouth at bedtime.  - Labs done today will call you with results

## 2020-03-16 NOTE — Progress Notes (Signed)
Provider: Deyton Ellenbecker FNP-C  Gayland Curry, DO  Patient Care Team: Gayland Curry, DO as PCP - General (Geriatric Medicine) Jerline Pain, MD as PCP - Cardiology (Cardiology) McKenzie, Candee Furbish, MD as Consulting Physician (Urology) Kristeen Miss, MD as Consulting Physician (Neurosurgery)  Extended Emergency Contact Information Primary Emergency Contact: Arlee Muslim Address: [address], [place] [address] Johnnette Litter of [place] Phone: [phone] Mobile Phone: [phone] Relation: Son Secondary Emergency Contact: [facility] Phone: [phone] Relation: Friend  Code Status:  Full Code  Goals of care: Advanced Directive information Advanced Directives 03/16/2020  Does Patient Have a Medical Advance Directive? Yes  Type of Advance Directive Living will  Does patient want to make changes to medical advance directive? No - Patient declined  Copy of [place] in Chart? -  Would patient like information on creating a medical advance directive? -  Pre-existing out of facility DNR order (yellow form or pink MOST form) -     Chief Complaint  Patient presents with  . Transitions Of Care    TOC follow-up 7/4/-03/08/2020 for chest pain. Per patient chest pain is resolved. Here with caregiver, Anne Ng   . Blood Pressure Check    B/P up and down, 187/98, 180/90, and 114/76 (home hreadings). Discuss b/p medications   . Medication Management    Xanax is ineffective, patient still with anxiety.     HPI:  Pt is a 74 y.o. female seen today for an acute visit for evaluation of TOC follow-up hospital admission from 03/04/2020 -03/08/2020 for chest pain.she was at home doing no activity when she had onset of mid-sternal pressure-like chest pain moderate -severe and radiated to the throat.Her B/p was 194/93 in the ED.Cardiloogy consulted.chest pain was thought to be related to blockages not fixable with procedures or surgery.Imdur  30 mg was initiated.Then follow up outpatient with Dr.Skains's office in 1 month.Coreg was reduced to 12.5 mg Bid and Spironolactone reduced to 12.5 mg once daily.CXR was clear.she was treated with I.V Lasix and admitted for flare up CHF.Her Hgb was low but FOBT positive 3 weeks ago.Aspirin and Xarelto was discontinued.Advised to follow up with Caroll Rancher was also treated with Keflex 500 mg twice daily for bladder infection discharged on 3 more days.she was advised to hold lasix for 5 days.She has a medical history of Hypertension,Afib,CHF EF 30 -35%,Depression with anxiety,GI bleed,COPD among other conditions.   Per patient chest pain is resolved. Here with caregiver, Anne Ng. States Blood pressure at home has been up and down, 187/98, 180/90, and 114/76 (home hreadings).Coreg and spironolactone decreased during recent Hospital admission.B/p within normal range during visit.Care giver states whenever patient is anxious B/p goes up.States   Xanax is ineffective, patient still with anxiety.    Past Medical History:  Diagnosis Date  . Anemia   . Anxiety   . Bruises easily   . CAD (coronary artery disease)    Bifurcational LAD/diagonal disease managed medically  . Cardiomyopathy ([place])   . Chronic hyponatremia   . Colitis   . COPD (chronic obstructive pulmonary disease) ([place])   . Depression   . Essential hypertension   . GERD (gastroesophageal reflux disease)   . GI bleed   . History of atrial fibrillation   . History of chronic bronchitis   . Insomnia   . LBBB (left bundle branch block)   . Macrocytosis   . Major depressive disorder   .  Nocturia   . Osteoporosis   . Raynaud's disease   . Sciatica    Right side  . Seronegative rheumatoid arthritis ([place])   . Venous insufficiency of leg   . VIN III (vulvar intraepithelial neoplasia III)    Recurrent  . Vitamin B12 deficiency    Past Surgical History:  Procedure Laterality Date  . APPENDECTOMY  1992  . BIOPSY  08/19/2019    Procedure: BIOPSY;  Surgeon: [name], MD;  Location: [place];  Service: Gastroenterology;;  . CARDIOVASCULAR STRESS TEST  06-23-2012  DR Jenkins [place]   NORMAL LEXISCAN MYOVIEW/ EF 83%/  NO ISCHEMIA  . CO2 LASER APPLICATION  22/48/2500   Procedure: CO2 LASER APPLICATION;  Surgeon: Janie Morning, MD PHD;  Location: [facility];  Service: Gynecology;  Laterality: N/A;  Laser Vaporization  . CO2 LASER APPLICATION N/A 11/06/486   Procedure: CO2 LASER APPLICATION;  Surgeon: [name], MD;  Location: [facility];  Service: Gynecology;  Laterality: N/A;  . CO2 LASER APPLICATION N/A 8/91/6945   Procedure: CO2 LASER APPLICATION of the vulva;  Surgeon: Janie Morning, MD;  Location: [facility];  Service: Gynecology;  Laterality: N/A;  . COLONOSCOPY WITH PROPOFOL N/A 08/19/2019   Procedure: COLONOSCOPY WITH PROPOFOL;  Surgeon: [name], MD;  Location: [place];  Service: Gastroenterology;  Laterality: N/A;  . ESOPHAGOGASTRODUODENOSCOPY (EGD) WITH PROPOFOL N/A 08/19/2019   Procedure: ESOPHAGOGASTRODUODENOSCOPY (EGD) WITH PROPOFOL;  Surgeon: [name], MD;  Location: [place];  Service: Gastroenterology;  Laterality: N/A;  . ESOPHAGOGASTRODUODENOSCOPY (EGD) WITH PROPOFOL N/A 02/11/2020   Procedure: ESOPHAGOGASTRODUODENOSCOPY (EGD) WITH PROPOFOL;  Surgeon: Ronald Lobo, MD;  Location: [place];  Service: Endoscopy;  Laterality: N/A;  . ESOPHAGOGASTRODUODENOSCOPY (EGD) WITH PROPOFOL N/A 03/05/2020   Procedure: ESOPHAGOGASTRODUODENOSCOPY (EGD) WITH PROPOFOL;  Surgeon: [name], MD;  Location: [place];  Service: Gastroenterology;  Laterality: N/A;  . EXCISION VULVAR LESIONS  07/2012  . GIVENS CAPSULE STUDY N/A 02/12/2020   Procedure: GIVENS CAPSULE STUDY;  Surgeon: Ronald Lobo, MD;  Location: [facility];  Service: Endoscopy;  Laterality: N/A;  . HEMORRHOID SURGERY N/A 04/15/2013   Procedure: Excision of external and internal   hemorrhoid with AIN, Exam under anesthesia;  Surgeon: Odis Hollingshead, MD;  Location: [facility];  Service: General;  Laterality: N/A;  . HIP PINNING,CANNULATED Right 03/15/2019   Procedure: Maebelle Munroe OF FEMORAL NECK FRACTURE;  Surgeon: Renette Butters, MD;  Location: [place];  Service: Orthopedics;  Laterality: Right;  . POLYPECTOMY  08/19/2019   Procedure: POLYPECTOMY;  Surgeon: [name], MD;  Location: [facility] ENDOSCOPY;  Service: Gastroenterology;;  . REPAIR FISTULA IN ANO/  I & D PERIRECTAL ABSCESS  10-26-2002  DR Johnathan Hausen  . RIGHT/LEFT HEART CATH AND CORONARY ANGIOGRAPHY N/A 01/03/2020   Procedure: RIGHT/LEFT HEART CATH AND CORONARY ANGIOGRAPHY;  Surgeon: Belva Crome, MD;  Location: [place] [facility];  Service: Cardiovascular;  Laterality: N/A;  . TRANSTHORACIC ECHOCARDIOGRAM  06-23-2012   NORMAL LVSF/ EF 55-60%  . VAGINAL HYSTERECTOMY  1997   partial  . VULVAR LESION REMOVAL N/A 11/03/2014   Procedure: CO2 LASER OF VULVAR ;  Surgeon: [name], MD;  Location: [facility];  Service: Gynecology;  Laterality: N/A;  . VULVECTOMY N/A 03/26/2015   Procedure: WIDE LOCA  EXCISION OF VULVAR;  Surgeon: [name], MD;  Location: [place];  Service: Gynecology;  Laterality: N/A;  . Wide Local Excision of labia majora  04-04-2010   Right-sided lesion,  CO2 ablation of right labia minora    Allergies  Allergen Reactions  . Latex Swelling and Other (See Comments)     fever blisters  . Augmentin [Amoxicillin-Pot Clavulanate] Nausea And Vomiting  . Doxycycline Other (See Comments)    Blurred vision, nervous, unsteady  . Hydrocodone Itching    Acetaminophen:itching, confusion, hallucinations-side effects-Onset date 07/15/2018  . Lactose Intolerance (Gi) Other (See Comments)    Unknown, per pt  . Lialda [Mesalamine] Diarrhea  . Prinivil [Lisinopril] Diarrhea  . Triamcinolone Acetonide Other (See Comments)    REDNESS AND PAIN  . Amlodipine Rash  . Lotensin  [Benazepril Hcl] Rash    Outpatient Encounter Medications as of 03/16/2020  Medication Sig  . acetaminophen (TYLENOL) 325 MG tablet Take 650 mg by mouth every 6 (six) hours as needed for mild pain.   Marland Kitchen ALPRAZolam (XANAX) 0.5 MG tablet Take 1 tablet (0.5 mg total) by mouth 3 (three) times daily as needed for anxiety.  Marland Kitchen atorvastatin (LIPITOR) 80 MG tablet Take 1 tablet (80 mg total) by mouth daily.  . busPIRone (BUSPAR) 30 MG tablet Take 30 mg by mouth 2 (two) times daily.   . calcium carbonate (OSCAL) 1500 (600 Ca) MG TABS tablet Take 600 mg of elemental calcium by mouth daily.  . carvedilol (COREG) 12.5 MG tablet Take 1 tablet (12.5 mg total) by mouth 2 (two) times daily with a meal.  . denosumab (PROLIA) 60 MG/ML SOSY injection Inject 60 mg into the skin every 6 (six) months.  . ferrous sulfate 325 (65 FE) MG tablet Take 325 mg by mouth daily with breakfast.  . furosemide (LASIX) 40 MG tablet Take 1 tablet (40 mg total) by mouth daily.  . hydroxychloroquine (PLAQUENIL) 200 MG tablet Take 200 mg by mouth daily.  . isosorbide mononitrate (IMDUR) 30 MG 24 hr tablet Take 0.5 tablets (15 mg total) by mouth daily.  Marland Kitchen loperamide (IMODIUM) 2 MG capsule Take 1 capsule (2 mg total) by mouth every 6 (six) hours as needed for diarrhea or loose stools.  . mirtazapine (REMERON) 7.5 MG tablet Take 1 tablet (7.5 mg total) by mouth at bedtime.  . Multiple Vitamin (MULTIVITAMIN WITH MINERALS) TABS tablet Take 1 tablet by mouth daily.  Vladimir Faster Glycol-Propyl Glycol (SYSTANE FREE OP) Place 1 drop into both eyes daily as needed (For Dry eyes).  . predniSONE (DELTASONE) 5 MG tablet Take 5 mg by mouth daily.  Marland Kitchen saccharomyces boulardii (FLORASTOR) 250 MG capsule Take 1 capsule (250 mg total) by mouth 2 (two) times daily.  Marland Kitchen spironolactone (ALDACTONE) 25 MG tablet Take 0.5 tablets (12.5 mg total) by mouth daily.   No facility-administered encounter medications on file as of 03/16/2020.    Review of Systems    Constitutional: Negative for appetite change, chills, fatigue and fever.  Respiratory: Negative for cough, chest tightness, shortness of breath and wheezing.   Cardiovascular: Positive for leg swelling. Negative for chest pain and palpitations.  Gastrointestinal: Negative for abdominal distention, abdominal pain, constipation, diarrhea, nausea and vomiting.  Musculoskeletal: Positive for gait problem.  Skin: Negative for color change, pallor and rash.  Neurological: Negative for dizziness, speech difficulty, weakness, light-headedness and headaches.  Psychiatric/Behavioral: Positive for sleep disturbance. Negative for agitation. The patient is nervous/anxious.     Immunization History  Administered Date(s) Administered  . Influenza, High Dose Seasonal PF 07/13/2017  . PFIZER SARS-COV-2 Vaccination 11/22/2019, 12/13/2019  . Tdap 09/11/2018   Pertinent  Health Maintenance Due  Topic Date Due  . PNA  vac Low Risk Adult (1 of 2 - PCV13) Never done  . INFLUENZA VACCINE  04/01/2020  . COLONOSCOPY  08/18/2029  . DEXA SCAN  Completed   Fall Risk  03/16/2020 02/22/2020 01/16/2020 12/12/2019 11/07/2019  Falls in the past year? 0 0 0 0 0  Number falls in past yr: 0 0 0 0 0  Injury with Fall? 0 0 0 0 0    Vitals:   03/16/20 1424  BP: 136/84  Pulse: 78  Temp: (!) 96.8 F (36 C)  TempSrc: Temporal  SpO2: 98%  Weight: 96 lb (43.5 kg)  Height: '5\' 5"'  (1.651 m)   Body mass index is 15.98 kg/m. Physical Exam Constitutional:      General: She is not in acute distress.    Appearance: She is not ill-appearing.     Comments: Frail elderly   HENT:     Head: Normocephalic.     Mouth/Throat:     Mouth: Mucous membranes are moist.     Pharynx: Oropharynx is clear. No oropharyngeal exudate or posterior oropharyngeal erythema.  Eyes:     General: No scleral icterus.       Right eye: No discharge.        Left eye: No discharge.     Extraocular Movements: Extraocular movements intact.      Conjunctiva/sclera: Conjunctivae normal.     Pupils: Pupils are equal, round, and reactive to light.  Neck:     Vascular: No carotid bruit.  Cardiovascular:     Rate and Rhythm: Normal rate and regular rhythm.     Pulses: Normal pulses.     Heart sounds: Normal heart sounds. No murmur heard.  No friction rub. No gallop.   Pulmonary:     Effort: Pulmonary effort is normal. No respiratory distress.     Breath sounds: Normal breath sounds. No wheezing, rhonchi or rales.  Chest:     Chest wall: No tenderness.  Abdominal:     General: Bowel sounds are normal. There is no distension.     Palpations: Abdomen is soft. There is no mass.     Tenderness: There is no abdominal tenderness. There is no right CVA tenderness, left CVA tenderness, guarding or rebound.  Musculoskeletal:        General: No swelling or tenderness.     Cervical back: Normal range of motion. No rigidity or tenderness.     Comments: Unsteady gait on wheelchair during visit.bilateral lower extremities trace edema knee high ted hose in place   Lymphadenopathy:     Cervical: No cervical adenopathy.  Skin:    General: Skin is warm and dry.     Coloration: Skin is not pale.     Findings: No erythema or rash.     Comments: Sacral area redness nonblanchable   Neurological:     Mental Status: She is alert. Mental status is at baseline.     Cranial Nerves: No cranial nerve deficit.     Sensory: No sensory deficit.     Motor: No weakness.     Gait: Gait abnormal.  Psychiatric:        Mood and Affect: Mood is anxious and depressed.        Speech: Speech normal.        Behavior: Behavior is slowed.        Thought Content: Thought content normal.     Labs reviewed: Recent Labs    01/06/20 0820 02/09/20 1317 03/06/20 0425 03/06/20 1230 03/06/20 1500  03/07/20 0834 03/08/20 0504  NA 124*   < > 122*   < > 125* 127* 126*  K 4.2   < > 4.1   < > 3.8 3.4* 4.7  CL 94*   < > 93*   < > 94* 95* 97*  CO2 25   < > 21*   < > 23  22 21*  GLUCOSE 111*   < > 109*   < > 126* 170* 105*  BUN 28*   < > 30*   < > 27* 26* 25*  CREATININE 1.07*   < > 1.61*   < > 1.71* 1.57* 1.54*  CALCIUM 7.9*   < > 8.2*   < > 8.0* 8.2* 8.1*  MG 2.7*  --  1.9  --   --  1.7  --   PHOS  --   --  3.3  --   --  2.3*  --    < > = values in this interval not displayed.   Recent Labs    03/06/20 0425 03/07/20 0834 03/08/20 0504  AST '22 25 22  ' ALT '26 26 23  ' ALKPHOS 35* 39 35*  BILITOT 0.6 0.7 0.8  PROT 4.9* 5.1* 4.8*  ALBUMIN 2.6* 2.7* 2.4*   Recent Labs    03/04/20 0117 03/04/20 1015 03/06/20 0425 03/07/20 0834 03/08/20 0504  WBC 7.5   < > 6.9 8.4 6.9  NEUTROABS 4.8  --  4.5 6.4  --   HGB 8.7*   < > 8.7* 9.1* 8.1*  HCT 26.1*   < > 25.7* 27.2* 23.8*  MCV 94.2   < > 92.4 94.8 95.2  PLT 164   < > 170 169 161   < > = values in this interval not displayed.   Lab Results  Component Value Date   TSH 1.040 03/06/2020   Lab Results  Component Value Date   HGBA1C 5.4 09/24/2017   Lab Results  Component Value Date   CHOL 143 09/24/2017   HDL 96 09/24/2017   LDLCALC 42 09/24/2017   TRIG 24 09/24/2017   CHOLHDL 1.5 09/24/2017    Significant Diagnostic Results in last 30 days:  DG CHEST PORT 1 VIEW  Result Date: 03/06/2020 CLINICAL DATA:  Shortness of breath. EXAM: PORTABLE CHEST 1 VIEW COMPARISON:  March 04, 2020. FINDINGS: Stable cardiomegaly. No pneumothorax or pleural effusion is noted. Both lungs are clear. The visualized skeletal structures are unremarkable. IMPRESSION: No active disease. Aortic Atherosclerosis (ICD10-I70.0). Electronically Signed   By: Marijo Conception M.D.   On: 03/06/2020 10:26   DG Chest Portable 1 View  Result Date: 03/04/2020 CLINICAL DATA:  Chest pain. EXAM: PORTABLE CHEST 1 VIEW COMPARISON:  02/10/2020. FINDINGS: Cardiomegaly. Unchanged mediastinal contours with aortic atherosclerosis. Resolved interstitial opacities from prior exam. No pleural fluid or pneumothorax. No new airspace disease. There may  be trace fluid in the right minor fissure. No acute osseous abnormalities are seen. IMPRESSION: 1. Stable cardiomegaly and aortic atherosclerosis. 2. Resolved interstitial opacities from prior exam. No new findings. Electronically Signed   By: Keith Rake M.D.   On: 03/04/2020 02:25    Assessment/Plan 1. Generalized anxiety disorder Increase Remeron from 7.5 mg tablet to 15 mg tablet daily.continue on Xanax  - mirtazapine (REMERON) 15 MG tablet; Take 1 tablet (15 mg total) by mouth at bedtime.  Dispense: 30 tablet; Refill: 0  2. Moderate episode of recurrent major depressive disorder (HCC) Adjust Remeron as above.  - mirtazapine (REMERON) 15 MG  tablet; Take 1 tablet (15 mg total) by mouth at bedtime.  Dispense: 30 tablet; Refill: 0  3. Essential hypertension B/p at goal today tends to be high when anxious.  - CBC with Differential/Platelet - CMP with eGFR([facility])  4. Insomnia due to anxiety and fear Will increase Remeron as above  - mirtazapine (REMERON) 15 MG tablet; Take 1 tablet (15 mg total) by mouth at bedtime.  Dispense: 30 tablet; Refill: 0  5. Combined systolic and diastolic congestive heart failure No signs of fluid overload. - continue on furosemide,coreg and spironolactone. - follow up with Cardiologist as directed. - continue to monitor weight  Family/ staff Communication: Reviewed plan of care with patient and care giver.  Labs/tests ordered: - CBC with Differential/Platelet - CMP with eGFR([facility])  Next Appointment: 1 month for anxiety and depression   Kevin Mario C Krislyn Donnan, NP

## 2020-03-17 LAB — CBC WITH DIFFERENTIAL/PLATELET
Absolute Monocytes: 403 cells/uL (ref 200–950)
Basophils Absolute: 19 cells/uL (ref 0–200)
Basophils Relative: 0.2 %
Eosinophils Absolute: 10 cells/uL — ABNORMAL LOW (ref 15–500)
Eosinophils Relative: 0.1 %
HCT: 27.5 % — ABNORMAL LOW (ref 35.0–45.0)
Hemoglobin: 9.3 g/dL — ABNORMAL LOW (ref 11.7–15.5)
Lymphs Abs: 1632 cells/uL (ref 850–3900)
MCH: 32.1 pg (ref 27.0–33.0)
MCHC: 33.8 g/dL (ref 32.0–36.0)
MCV: 94.8 fL (ref 80.0–100.0)
MPV: 9.7 fL (ref 7.5–12.5)
Monocytes Relative: 4.2 %
Neutro Abs: 7536 cells/uL (ref 1500–7800)
Neutrophils Relative %: 78.5 %
Platelets: 186 10*3/uL (ref 140–400)
RBC: 2.9 10*6/uL — ABNORMAL LOW (ref 3.80–5.10)
RDW: 17.4 % — ABNORMAL HIGH (ref 11.0–15.0)
Total Lymphocyte: 17 %
WBC: 9.6 10*3/uL (ref 3.8–10.8)

## 2020-03-17 LAB — COMPLETE METABOLIC PANEL WITH GFR
AG Ratio: 1.6 (calc) (ref 1.0–2.5)
ALT: 24 U/L (ref 6–29)
AST: 23 U/L (ref 10–35)
Albumin: 3.7 g/dL (ref 3.6–5.1)
Alkaline phosphatase (APISO): 45 U/L (ref 37–153)
BUN/Creatinine Ratio: 13 (calc) (ref 6–22)
BUN: 17 mg/dL (ref 7–25)
CO2: 24 mmol/L (ref 20–32)
Calcium: 9.8 mg/dL (ref 8.6–10.4)
Chloride: 96 mmol/L — ABNORMAL LOW (ref 98–110)
Creat: 1.31 mg/dL — ABNORMAL HIGH (ref 0.60–0.93)
GFR, Est African American: 46 mL/min/{1.73_m2} — ABNORMAL LOW (ref >=60)
GFR, Est Non African American: 40 mL/min/{1.73_m2} — ABNORMAL LOW (ref >=60)
Globulin: 2.3 g/dL (calc) (ref 1.9–3.7)
Glucose, Bld: 120 mg/dL (ref 65–139)
Potassium: 4.5 mmol/L (ref 3.5–5.3)
Sodium: 130 mmol/L — ABNORMAL LOW (ref 135–146)
Total Bilirubin: 0.4 mg/dL (ref 0.2–1.2)
Total Protein: 6 g/dL — ABNORMAL LOW (ref 6.1–8.1)

## 2020-03-19 DIAGNOSIS — F419 Anxiety disorder, unspecified: Secondary | ICD-10-CM | POA: Diagnosis not present

## 2020-03-19 DIAGNOSIS — J441 Chronic obstructive pulmonary disease with (acute) exacerbation: Secondary | ICD-10-CM | POA: Diagnosis not present

## 2020-03-19 DIAGNOSIS — I088 Other rheumatic multiple valve diseases: Secondary | ICD-10-CM | POA: Diagnosis not present

## 2020-03-19 DIAGNOSIS — M0600 Rheumatoid arthritis without rheumatoid factor, unspecified site: Secondary | ICD-10-CM | POA: Diagnosis not present

## 2020-03-19 DIAGNOSIS — M810 Age-related osteoporosis without current pathological fracture: Secondary | ICD-10-CM | POA: Diagnosis not present

## 2020-03-19 DIAGNOSIS — G834 Cauda equina syndrome: Secondary | ICD-10-CM | POA: Diagnosis not present

## 2020-03-19 DIAGNOSIS — D62 Acute posthemorrhagic anemia: Secondary | ICD-10-CM | POA: Diagnosis not present

## 2020-03-19 DIAGNOSIS — I4892 Unspecified atrial flutter: Secondary | ICD-10-CM | POA: Diagnosis not present

## 2020-03-19 DIAGNOSIS — K922 Gastrointestinal hemorrhage, unspecified: Secondary | ICD-10-CM | POA: Diagnosis not present

## 2020-03-19 DIAGNOSIS — M5441 Lumbago with sciatica, right side: Secondary | ICD-10-CM | POA: Diagnosis not present

## 2020-03-19 DIAGNOSIS — I5043 Acute on chronic combined systolic (congestive) and diastolic (congestive) heart failure: Secondary | ICD-10-CM | POA: Diagnosis not present

## 2020-03-19 DIAGNOSIS — I7300 Raynaud's syndrome without gangrene: Secondary | ICD-10-CM | POA: Diagnosis not present

## 2020-03-19 DIAGNOSIS — I0981 Rheumatic heart failure: Secondary | ICD-10-CM | POA: Diagnosis not present

## 2020-03-19 DIAGNOSIS — N1832 Chronic kidney disease, stage 3b: Secondary | ICD-10-CM | POA: Diagnosis not present

## 2020-03-19 DIAGNOSIS — F329 Major depressive disorder, single episode, unspecified: Secondary | ICD-10-CM | POA: Diagnosis not present

## 2020-03-19 DIAGNOSIS — I482 Chronic atrial fibrillation, unspecified: Secondary | ICD-10-CM | POA: Diagnosis not present

## 2020-03-19 DIAGNOSIS — D631 Anemia in chronic kidney disease: Secondary | ICD-10-CM | POA: Diagnosis not present

## 2020-03-19 DIAGNOSIS — M5417 Radiculopathy, lumbosacral region: Secondary | ICD-10-CM | POA: Diagnosis not present

## 2020-03-19 DIAGNOSIS — I25119 Atherosclerotic heart disease of native coronary artery with unspecified angina pectoris: Secondary | ICD-10-CM | POA: Diagnosis not present

## 2020-03-19 DIAGNOSIS — K6282 Dysplasia of anus: Secondary | ICD-10-CM | POA: Diagnosis not present

## 2020-03-19 DIAGNOSIS — I130 Hypertensive heart and chronic kidney disease with heart failure and stage 1 through stage 4 chronic kidney disease, or unspecified chronic kidney disease: Secondary | ICD-10-CM | POA: Diagnosis not present

## 2020-03-19 DIAGNOSIS — K529 Noninfective gastroenteritis and colitis, unspecified: Secondary | ICD-10-CM | POA: Diagnosis not present

## 2020-03-19 DIAGNOSIS — M170 Bilateral primary osteoarthritis of knee: Secondary | ICD-10-CM | POA: Diagnosis not present

## 2020-03-19 DIAGNOSIS — M8580 Other specified disorders of bone density and structure, unspecified site: Secondary | ICD-10-CM | POA: Diagnosis not present

## 2020-03-19 DIAGNOSIS — I447 Left bundle-branch block, unspecified: Secondary | ICD-10-CM | POA: Diagnosis not present

## 2020-03-20 NOTE — Progress Notes (Signed)
CARDIOLOGY OFFICE NOTE  Date:  03/26/2020    Regina Ortiz Date of Birth: 11-29-1945 Medical Record #[id number]  PCP:  Gayland Curry, DO  Cardiologist:  [facility]  Chief Complaint  Patient presents with  . Follow-up    Seen for Dr. Marlou Porch    History of Present Illness: Regina Ortiz is a 74 y.o. female who presents today for a follow up visit. Seen for Dr. Marlou Porch.   She has a history of known LBBB, chronic chest pain with prior ER visits, low risk Myoview and ongoing PTSD from witnessing her husband commit suicide. Other issues with PAF - previously on Xarelto, major depression, HTN, chronic hyponatremia, RA, neurogenic bladder, cauda equina syndrome with prior decompression by Dr. Ellene Route, anemia, tobacco abuse, GERD, collagenous colitis, RA and COPD.   Last seen by virtual visit last May with Dr. Marlou Porch. Chronic chest pain continued to be endorsed. Coronary CT was ordered - does not look like she followed thru with this.   Several admissions since last seen here. In May she presented with hypoxia - elevated BNP and CHF - ended up having cath, EF is down at 30 to 35%. Noted PAF - started on Xarelto. Presented in June with acute GI bleed - HGB of 4. Was transfused and had pulmonary edema. Xarelto looks to have been restarted. GI work up looks unremarkable. Lastly, she was admitted earlier this month with acute chest pain/CHF - felt to have been precipitated by her anemia - aspirin and Xarelto were then stopped. Imdur was added. Palliative care was consulted.   Comes in today. Here with her caregiver Anne Ng who augments the history. Anne Ng feels like she is doing ok. She is in a wheelchair. She admits that anxiety remains the crux of her issues. She struggles with this 24/7. Sounds like her chest pain is better. No obvious bleeding. On little medicine due to the recent events. Trying to restrict her fluids. Sounds like Palliative care has come out. She has been acceptable to  this.   Past Medical History:  Diagnosis Date  . Anemia   . Anxiety   . Bruises easily   . CAD (coronary artery disease)    Bifurcational LAD/diagonal disease managed medically  . Cardiomyopathy ([place])   . Chronic hyponatremia   . Colitis   . COPD (chronic obstructive pulmonary disease) ([place])   . Depression   . Essential hypertension   . GERD (gastroesophageal reflux disease)   . GI bleed   . History of atrial fibrillation   . History of chronic bronchitis   . Insomnia   . LBBB (left bundle branch block)   . Macrocytosis   . Major depressive disorder   . Nocturia   . Osteoporosis   . Raynaud's disease   . Sciatica    Right side  . Seronegative rheumatoid arthritis ([place])   . Venous insufficiency of leg   . VIN III (vulvar intraepithelial neoplasia III)    Recurrent  . Vitamin B12 deficiency     Past Surgical History:  Procedure Laterality Date  . APPENDECTOMY  1992  . BIOPSY  08/19/2019   Procedure: BIOPSY;  Surgeon: [name], MD;  Location: [place];  Service: Gastroenterology;;  . CARDIOVASCULAR STRESS TEST  06-23-2012  DR Jenkins [place]   NORMAL LEXISCAN MYOVIEW/ EF 83%/  NO ISCHEMIA  . CO2 LASER APPLICATION  94/50/3888   Procedure: CO2 LASER APPLICATION;  Surgeon: Janie Morning, MD PHD;  Location: [facility];  Service: Gynecology;  Laterality: N/A;  Laser Vaporization  . CO2 LASER APPLICATION N/A 11/07/4663   Procedure: CO2 LASER APPLICATION;  Surgeon: [name], MD;  Location: [facility];  Service: Gynecology;  Laterality: N/A;  . CO2 LASER APPLICATION N/A 9/93/5701   Procedure: CO2 LASER APPLICATION of the vulva;  Surgeon: Janie Morning, MD;  Location: [facility];  Service: Gynecology;  Laterality: N/A;  . COLONOSCOPY WITH PROPOFOL N/A 08/19/2019   Procedure: COLONOSCOPY WITH PROPOFOL;  Surgeon: [name], MD;  Location: [place];  Service: Gastroenterology;  Laterality: N/A;  .  ESOPHAGOGASTRODUODENOSCOPY (EGD) WITH PROPOFOL N/A 08/19/2019   Procedure: ESOPHAGOGASTRODUODENOSCOPY (EGD) WITH PROPOFOL;  Surgeon: [name], MD;  Location: [place];  Service: Gastroenterology;  Laterality: N/A;  . ESOPHAGOGASTRODUODENOSCOPY (EGD) WITH PROPOFOL N/A 02/11/2020   Procedure: ESOPHAGOGASTRODUODENOSCOPY (EGD) WITH PROPOFOL;  Surgeon: Ronald Lobo, MD;  Location: [place];  Service: Endoscopy;  Laterality: N/A;  . ESOPHAGOGASTRODUODENOSCOPY (EGD) WITH PROPOFOL N/A 03/05/2020   Procedure: ESOPHAGOGASTRODUODENOSCOPY (EGD) WITH PROPOFOL;  Surgeon: [name], MD;  Location: [place];  Service: Gastroenterology;  Laterality: N/A;  . EXCISION VULVAR LESIONS  07/2012  . GIVENS CAPSULE STUDY N/A 02/12/2020   Procedure: GIVENS CAPSULE STUDY;  Surgeon: Ronald Lobo, MD;  Location: [facility];  Service: Endoscopy;  Laterality: N/A;  . HEMORRHOID SURGERY N/A 04/15/2013   Procedure: Excision of external and internal  hemorrhoid with AIN, Exam under anesthesia;  Surgeon: Odis Hollingshead, MD;  Location: [facility];  Service: General;  Laterality: N/A;  . HIP PINNING,CANNULATED Right 03/15/2019   Procedure: Maebelle Munroe OF FEMORAL NECK FRACTURE;  Surgeon: Renette Butters, MD;  Location: [place];  Service: Orthopedics;  Laterality: Right;  . POLYPECTOMY  08/19/2019   Procedure: POLYPECTOMY;  Surgeon: [name], MD;  Location: [facility] ENDOSCOPY;  Service: Gastroenterology;;  . REPAIR FISTULA IN ANO/  I & D PERIRECTAL ABSCESS  10-26-2002  DR Johnathan Hausen  . RIGHT/LEFT HEART CATH AND CORONARY ANGIOGRAPHY N/A 01/03/2020   Procedure: RIGHT/LEFT HEART CATH AND CORONARY ANGIOGRAPHY;  Surgeon: Belva Crome, MD;  Location: [place] [facility];  Service: Cardiovascular;  Laterality: N/A;  . TRANSTHORACIC ECHOCARDIOGRAM  06-23-2012   NORMAL LVSF/ EF 55-60%  . VAGINAL HYSTERECTOMY  1997   partial  . VULVAR LESION REMOVAL N/A 11/03/2014   Procedure: CO2 LASER OF VULVAR ;  Surgeon: [name], MD;   Location: [facility];  Service: Gynecology;  Laterality: N/A;  . VULVECTOMY N/A 03/26/2015   Procedure: WIDE LOCA  EXCISION OF VULVAR;  Surgeon: [name], MD;  Location: [place];  Service: Gynecology;  Laterality: N/A;  . Wide Local Excision of labia majora  04-04-2010   Right-sided lesion, CO2 ablation of right labia minora     Medications: Current Meds  Medication Sig  . acetaminophen (TYLENOL) 325 MG tablet Take 650 mg by mouth every 6 (six) hours as needed for mild pain.   Marland Kitchen ALPRAZolam (XANAX) 0.5 MG tablet Take 1 tablet (0.5 mg total) by mouth 3 (three) times daily as needed for anxiety.  Marland Kitchen atorvastatin (LIPITOR) 80 MG tablet Take 1 tablet (80 mg total) by mouth daily.  . busPIRone (BUSPAR) 30 MG tablet Take 30 mg by mouth 2 (two) times daily.   . calcium carbonate (OSCAL) 1500 (600 Ca) MG TABS tablet Take 600 mg of elemental calcium by mouth daily.  . carvedilol (COREG) 12.5 MG tablet Take 12.5 mg by mouth 2 (two) times daily with a meal.  Patient is taking 1/2 tablet of 12.5 mg tablet  . denosumab (PROLIA) 60 MG/ML SOSY injection Inject 60 mg into the skin every 6 (six) months.  . ferrous sulfate 325 (65 FE) MG tablet Take 325 mg by mouth daily with breakfast.  . furosemide (LASIX) 40 MG tablet Take 1 tablet (40 mg total) by mouth daily.  . hydroxychloroquine (PLAQUENIL) 200 MG tablet Take 200 mg by mouth daily.  . isosorbide mononitrate (IMDUR) 30 MG 24 hr tablet Take 0.5 tablets (15 mg total) by mouth daily.  Marland Kitchen loperamide (IMODIUM) 2 MG capsule Take 1 capsule (2 mg total) by mouth every 6 (six) hours as needed for diarrhea or loose stools.  . mirtazapine (REMERON) 15 MG tablet Take 1 tablet (15 mg total) by mouth at bedtime.  . Multiple Vitamin (MULTIVITAMIN WITH MINERALS) TABS tablet Take 1 tablet by mouth daily.  Vladimir Faster Glycol-Propyl Glycol (SYSTANE FREE OP) Place 1 drop into both eyes daily as needed (For Dry eyes).  . predniSONE  (DELTASONE) 5 MG tablet Take 5 mg by mouth daily.  Marland Kitchen saccharomyces boulardii (FLORASTOR) 250 MG capsule Take 1 capsule (250 mg total) by mouth 2 (two) times daily.  Marland Kitchen spironolactone (ALDACTONE) 25 MG tablet Take 0.5 tablets (12.5 mg total) by mouth daily.     Allergies: Allergies  Allergen Reactions  . Latex Swelling and Other (See Comments)     fever blisters  . Augmentin [Amoxicillin-Pot Clavulanate] Nausea And Vomiting  . Doxycycline Other (See Comments)    Blurred vision, nervous, unsteady  . Hydrocodone Itching    Acetaminophen:itching, confusion, hallucinations-side effects-Onset date 07/15/2018  . Lactose Intolerance (Gi) Other (See Comments)    Unknown, per pt  . Lialda [Mesalamine] Diarrhea  . Prinivil [Lisinopril] Diarrhea  . Triamcinolone Acetonide Other (See Comments)    REDNESS AND PAIN  . Amlodipine Rash  . Lotensin [Benazepril Hcl] Rash    Social History: The patient  reports that she quit smoking about 5 months ago. Her smoking use included cigarettes. She quit after 50.00 years of use. She has never used smokeless tobacco. She reports previous alcohol use of about 2.0 standard drinks of alcohol per week. She reports that she does not use drugs.   Family History: The patient's family history includes Cancer in her son; Diabetes in her mother; Heart attack (age of onset: 41) in her father; Hypertension in her father; Lymphoma in her son.   Review of Systems: Please see the history of present illness.   All other systems are reviewed and negative.   Physical Exam: VS:  BP 120/80   Pulse 81   Ht 5\' 4"  (1.626 m)   Wt (!) 93 lb 9.6 oz (42.5 kg)   LMP  (LMP Unknown)   SpO2 98%   BMI 16.07 kg/m  .  BMI Body mass index is 16.07 kg/m.  Wt Readings from Last 3 Encounters:  03/26/20 (!) 93 lb 9.6 oz (42.5 kg)  03/16/20 96 lb (43.5 kg)  03/08/20 92 lb 11.2 oz (42 kg)    General: Chronically ill. Looks frail. Quite thin. She is in no acute distress.   Cardiac:  Regular rate and rhythm. Heart tones are distant. Systolic murmur noted. No edema.  Respiratory:  Lungs are clear to auscultation bilaterally with normal work of breathing.  GI: Soft and nontender.  MS: No deformity or atrophy. Gait not tested.  Skin: Warm and dry. Color is normal.  Neuro:  Strength and sensation are intact and no gross  focal deficits noted.  Psych: Alert, appropriate and with normal affect.   LABORATORY DATA:  EKG:  EKG is not ordered today.    Lab Results  Component Value Date   WBC 9.6 03/16/2020   HGB 9.3 (L) 03/16/2020   HCT 27.5 (L) 03/16/2020   PLT 186 03/16/2020   GLUCOSE 120 03/16/2020   CHOL 143 09/24/2017   TRIG 24 09/24/2017   HDL 96 09/24/2017   LDLCALC 42 09/24/2017   ALT 24 03/16/2020   AST 23 03/16/2020   NA 130 (L) 03/16/2020   K 4.5 03/16/2020   CL 96 (L) 03/16/2020   CREATININE 1.31 (H) 03/16/2020   BUN 17 03/16/2020   CO2 24 03/16/2020   TSH 1.040 03/06/2020   INR 1.2 03/05/2020   HGBA1C 5.4 09/24/2017       BNP (last 3 results) Recent Labs    12/31/19 0432 02/09/20 1317 03/04/20 0117  BNP 3,381.8* 786.6* 1,072.2*    ProBNP (last 3 results) No results for input(s): PROBNP in the last 8760 hours.   Other Studies Reviewed Today:  RIGHT/LEFT HEART CATH AND CORONARY ANGIOGRAPHY 12/2019  Conclusion   Heavy left main and proximal LAD calcification.  Moderate to moderately severe proximal LAD diagonal bifurcation stenosis up to 70%.  Widely patent circumflex.  Widely patent right coronary.  Mid anterior wall hypokinesis greater than other walls related to ischemia from LAD versus dyssynchrony associated with left bundle branch block.  Estimated LVEF is 35 to 40%.  LVEDP is 25 mmHg.  Significant mitral regurgitation by 2D Doppler echocardiogram.  Severe hypertension noted during the procedure.  RECOMMENDATIONS:   Medical therapy of heart failure including additional diuresis.  Preventive therapy including  high intensity statin therapy for LAD disease.  Downstream management could include PCI with debulking using atherectomy versus combined coronary artery bypass grafting and mitral valve repair depending upon the status of the mitral valve and role in development of acute pulmonary edema.  Once stable, may need transesophageal echocardiography.  Further discussion concerning severity of mitral valve disease on appropriate management.  Reassess kidney function in a.m.  In lab hypertension treated with IV hydralazine.  Aggressive management of hypertension is required and may have been the driving precipitant for acute heart failure.   ECHO IMPRESSIONS 12/2019  1. Moderate to severe global reduction in LV systolic function; grade 1  diastolic dysfunction; moderate LVH; moderate MR; severe LAE.  2. Left ventricular ejection fraction, by estimation, is 30 to 35%. The  left ventricle has moderately decreased function. The left ventricle  demonstrates global hypokinesis. There is moderate left ventricular  hypertrophy. Left ventricular diastolic  parameters are consistent with Grade I diastolic dysfunction (impaired  relaxation).  3. Right ventricular systolic function is normal. The right ventricular  size is normal.  4. Left atrial size was severely dilated.  5. The mitral valve is normal in structure. Moderate mitral valve  regurgitation. No evidence of mitral stenosis.  6. The aortic valve is tricuspid. Aortic valve regurgitation is not  visualized. Mild aortic valve sclerosis is present, with no evidence of  aortic valve stenosis.  7. The inferior vena cava is normal in size with greater than 50%  respiratory variability, suggesting right atrial pressure of 3 mmHg.    Carotid studies: 09/08/2018 Right Carotid: Velocities in the right ICA are consistent with a 1-39% stenosis.  Left Carotid: Velocities in the left ICA are consistent with a 1-39% stenosis.  Vertebrals:  Bilateral vertebral arteries demonstrate antegrade flow. Subclavians:  Normal flow hemodynamics were seen in bilateral subclavian arteries.   ECHO: 2019 - Left ventricle: The cavity size was normal. Wall thickness was normal. Systolic function was normal. The estimated ejection fraction was in the range of 55% to 60%. Wall motion was normal; there were no regional wall motion abnormalities. Doppler parameters are consistent with abnormal left ventricular relaxation (grade 1 diastolic dysfunction). - Ventricular septum: Septal motion showed abnormal function and dyssynergy.  Impressions:  - Normal LV systolic function; mild diastolic dysfunction.     ASSESSMENT & PLAN:    1. CAD - to manage medically which is challenging given her overall situation. Recent bout due to her anemia.   2. Chronic systolic HF - limited therapy given her vasculitis/CKD. No worrisome symptoms. Her overall prognosis is quite tenuous in my opinion.   3. Prior GI bleed with profound anemia - rechecking lab. No more Xarelto or aspirin.   4. Hyponatremia - rechecking lab today  5. Significant anxiety/PTSD - this seems to be the crux of her issues in my opinion. Agree with palliative care.   6. PAF - limited options - in sinus by exam. No longer on any form of anticoagulation.    Current medicines are reviewed with the patient today.  The patient does not have concerns regarding medicines other than what has been noted above.  The following changes have been made:  See above.  Labs/ tests ordered today include:   No orders of the defined types were placed in this encounter.    Disposition:   FU with [place] in about 4 to 6 weeks.    Patient is agreeable to this plan and will call if any problems develop in the interim.   SignedTruitt Merle, NP  03/26/2020 10:53 AM  [facility] [address] [place], [place]   [address] Phone: [phone] Fax: [phone]

## 2020-03-21 ENCOUNTER — Other Ambulatory Visit: Payer: Self-pay

## 2020-03-21 DIAGNOSIS — E871 Hypo-osmolality and hyponatremia: Secondary | ICD-10-CM

## 2020-03-26 ENCOUNTER — Ambulatory Visit (INDEPENDENT_AMBULATORY_CARE_PROVIDER_SITE_OTHER): Payer: PPO | Admitting: Nurse Practitioner

## 2020-03-26 ENCOUNTER — Other Ambulatory Visit: Payer: Self-pay

## 2020-03-26 ENCOUNTER — Encounter: Payer: Self-pay | Admitting: Nurse Practitioner

## 2020-03-26 VITALS — BP 120/80 | HR 81 | Ht 64.0 in | Wt 93.6 lb

## 2020-03-26 DIAGNOSIS — R0789 Other chest pain: Secondary | ICD-10-CM

## 2020-03-26 DIAGNOSIS — Z79899 Other long term (current) drug therapy: Secondary | ICD-10-CM | POA: Diagnosis not present

## 2020-03-26 DIAGNOSIS — I10 Essential (primary) hypertension: Secondary | ICD-10-CM

## 2020-03-26 NOTE — Patient Instructions (Addendum)
After Visit Summary:  We will be checking the following labs today - BMET & CBC   Medication Instructions:    Continue with your current medicines.    If you need a refill on your cardiac medications before your next appointment, please call your pharmacy.     Testing/Procedures To Be Arranged:  N/A  Follow-Up:   See Dr. Marlou Porch in 4 to 6 weeks    At [facility], you and your health needs are our priority.  As part of our continuing mission to provide you with exceptional heart care, we have created designated Provider Care Teams.  These Care Teams include your primary Cardiologist (physician) and Advanced Practice Providers (APPs -  Physician Assistants and Nurse Practitioners) who all work together to provide you with the care you need, when you need it.  Special Instructions:  . Stay safe, wash your hands for at least 20 seconds and wear a mask when needed.  . It was good to talk with you today.    Call the [place] office at [phone] if you have any questions, problems or concerns.

## 2020-03-27 LAB — BASIC METABOLIC PANEL
BUN/Creatinine Ratio: 9 — ABNORMAL LOW (ref 12–28)
BUN: 12 mg/dL (ref 8–27)
CO2: 21 mmol/L (ref 20–29)
Calcium: 8.9 mg/dL (ref 8.7–10.3)
Chloride: 92 mmol/L — ABNORMAL LOW (ref 96–106)
Creatinine, Ser: 1.28 mg/dL — ABNORMAL HIGH (ref 0.57–1.00)
GFR calc Af Amer: 47 mL/min/{1.73_m2} — ABNORMAL LOW (ref >59)
GFR calc non Af Amer: 41 mL/min/{1.73_m2} — ABNORMAL LOW (ref >59)
Glucose: 101 mg/dL — ABNORMAL HIGH (ref 65–99)
Potassium: 4.1 mmol/L (ref 3.5–5.2)
Sodium: 128 mmol/L — ABNORMAL LOW (ref 134–144)

## 2020-03-27 LAB — CBC
Hematocrit: 28.7 % — ABNORMAL LOW (ref 34.0–46.6)
Hemoglobin: 9.5 g/dL — ABNORMAL LOW (ref 11.1–15.9)
MCH: 31.8 pg (ref 26.6–33.0)
MCHC: 33.1 g/dL (ref 31.5–35.7)
MCV: 96 fL (ref 79–97)
Platelets: 220 10*3/uL (ref 150–450)
RBC: 2.99 x10E6/uL — ABNORMAL LOW (ref 3.77–5.28)
RDW: 17.9 % — ABNORMAL HIGH (ref 11.7–15.4)
WBC: 7.7 10*3/uL (ref 3.4–10.8)

## 2020-03-28 DIAGNOSIS — D509 Iron deficiency anemia, unspecified: Secondary | ICD-10-CM | POA: Diagnosis not present

## 2020-03-28 DIAGNOSIS — K922 Gastrointestinal hemorrhage, unspecified: Secondary | ICD-10-CM | POA: Diagnosis not present

## 2020-03-28 DIAGNOSIS — R198 Other specified symptoms and signs involving the digestive system and abdomen: Secondary | ICD-10-CM | POA: Diagnosis not present

## 2020-04-01 NOTE — Addendum Note (Signed)
Addended byMarlowe Sax C on: 04/01/2020 04:49 PM   Modules accepted: Level of Service

## 2020-04-02 ENCOUNTER — Other Ambulatory Visit: Payer: Self-pay

## 2020-04-02 MED ORDER — SPIRONOLACTONE 25 MG PO TABS: 12.5000 mg | ORAL_TABLET | Freq: Every day | ORAL | 2 refills | Status: DC

## 2020-04-02 MED ORDER — FUROSEMIDE 40 MG PO TABS: 40.0000 mg | ORAL_TABLET | Freq: Every day | ORAL | 2 refills | Status: DC

## 2020-04-02 MED ORDER — ALPRAZOLAM 0.5 MG PO TABS: 0.5000 mg | ORAL_TABLET | Freq: Three times a day (TID) | ORAL | 2 refills | Status: DC | PRN

## 2020-04-02 NOTE — Telephone Encounter (Signed)
Refill request from [facility]. Alprazolam pended and sent to Dr. Mariea Clonts for approval.

## 2020-04-03 ENCOUNTER — Ambulatory Visit (INDEPENDENT_AMBULATORY_CARE_PROVIDER_SITE_OTHER): Payer: PPO | Admitting: Family

## 2020-04-03 ENCOUNTER — Other Ambulatory Visit: Payer: Self-pay | Admitting: Family

## 2020-04-03 ENCOUNTER — Other Ambulatory Visit: Payer: Self-pay

## 2020-04-03 ENCOUNTER — Encounter: Payer: Self-pay | Admitting: Family

## 2020-04-03 VITALS — BP 118/62 | HR 96 | Temp 97.7°F | Ht 64.0 in | Wt 93.0 lb

## 2020-04-03 DIAGNOSIS — L89151 Pressure ulcer of sacral region, stage 1: Secondary | ICD-10-CM

## 2020-04-03 DIAGNOSIS — R399 Unspecified symptoms and signs involving the genitourinary system: Secondary | ICD-10-CM

## 2020-04-03 DIAGNOSIS — F411 Generalized anxiety disorder: Secondary | ICD-10-CM

## 2020-04-03 DIAGNOSIS — F409 Phobic anxiety disorder, unspecified: Secondary | ICD-10-CM

## 2020-04-03 DIAGNOSIS — F321 Major depressive disorder, single episode, moderate: Secondary | ICD-10-CM

## 2020-04-03 MED ORDER — CIPROFLOXACIN HCL 500 MG PO TABS: 500.0000 mg | ORAL_TABLET | Freq: Two times a day (BID) | ORAL | 0 refills | Status: AC

## 2020-04-03 MED ORDER — ENSURE ACTIVE HIGH PROTEIN PO LIQD: 1.0000 | Freq: Every day | ORAL | 3 refills | Status: AC

## 2020-04-03 NOTE — Patient Instructions (Signed)
-   Home Health Nurse to cleanse sacral ulcer,pat,dry and cover with foam dressing for protection and absorption.change dressing every three days.   - Drink Ensure one can by mouth daily

## 2020-04-03 NOTE — Progress Notes (Signed)
Provider: Mayjor Ager FNP-C  Gayland Curry, DO  Patient Care Team: Gayland Curry, DO as PCP - General (Geriatric Medicine) Jerline Pain, MD as PCP - Cardiology (Cardiology) McKenzie, Candee Furbish, MD as Consulting Physician (Urology) Kristeen Miss, MD as Consulting Physician (Neurosurgery)  Extended Emergency Contact Information Primary Emergency Contact: Arlee Muslim Address: [address], [place] [address] Johnnette Litter of [place] Phone: [phone] Mobile Phone: [phone] Relation: Son Secondary Emergency Contact: [facility] Phone: [phone] Relation: Friend  Code Status:  Full Code  Goals of care: Advanced Directive information Advanced Directives 04/03/2020  Does Patient Have a Medical Advance Directive? Yes  Type of Advance Directive Living will  Does patient want to make changes to medical advance directive? No - Patient declined  Copy of [place] in Chart? -  Would patient like information on creating a medical advance directive? -  Pre-existing out of facility DNR order (yellow form or pink MOST form) -     Chief Complaint  Patient presents with   Acute Visit    possible UTI, bump on vagina and backside redness     HPI:  Pt is a 74 y.o. female seen today for an acute visit for evaluation of possible UTI, bump on vagina and backside redness.She is here with care giver. States bump comes and goes for couple of days.Has had it for several years per care giver.Left labia itchy and hurts when she voids.Has been applying zinc cream.she has used monstat in the past.No drainage. Last night was not able to sleep due to frequent urination.Has had lower abdominal pain.No fever,chills,nausea,vomirting.  Care giver states patient has a spot on her sacral area which has been cleaning and applying Zinc oxide and cover with foam dressing.No drainage noted.states tends to sit on her chair prefers to sit on a  regular pillow.    Past Medical History:  Diagnosis Date   Anemia    Anxiety    Bruises easily    CAD (coronary artery disease)    Bifurcational LAD/diagonal disease managed medically   Cardiomyopathy ([place])    Chronic hyponatremia    Colitis    COPD (chronic obstructive pulmonary disease) (HCC)    Depression    Essential hypertension    GERD (gastroesophageal reflux disease)    GI bleed    History of atrial fibrillation    History of chronic bronchitis    Insomnia    LBBB (left bundle branch block)    Macrocytosis    Major depressive disorder    Nocturia    Osteoporosis    Raynaud's disease    Sciatica    Right side   Seronegative rheumatoid arthritis ([place])    Venous insufficiency of leg    VIN III (vulvar intraepithelial neoplasia III)    Recurrent   Vitamin B12 deficiency    Past Surgical History:  Procedure Laterality Date   APPENDECTOMY  1992   BIOPSY  08/19/2019   Procedure: BIOPSY;  Surgeon: [name], MD;  Location: [facility];  Service: Gastroenterology;;   CARDIOVASCULAR STRESS TEST  06-23-2012  DR Jenkins [place]   NORMAL LEXISCAN MYOVIEW/ EF 83%/  NO ISCHEMIA   CO2 LASER APPLICATION  24/82/5003   Procedure: CO2 LASER APPLICATION;  Surgeon: Janie Morning, MD PHD;  Location: [facility];  Service: Gynecology;  Laterality: N/A;  Laser Vaporization   CO2 LASER APPLICATION N/A 7/0/4888   Procedure: CO2  LASER APPLICATION;  Surgeon: [name], MD;  Location: [facility];  Service: Gynecology;  Laterality: N/A;   CO2 LASER APPLICATION N/A 10/11/4707   Procedure: CO2 LASER APPLICATION of the vulva;  Surgeon: Janie Morning, MD;  Location: [facility];  Service: Gynecology;  Laterality: N/A;   COLONOSCOPY WITH PROPOFOL N/A 08/19/2019   Procedure: COLONOSCOPY WITH PROPOFOL;  Surgeon: [name], MD;  Location: [place];  Service: Gastroenterology;  Laterality: N/A;    ESOPHAGOGASTRODUODENOSCOPY (EGD) WITH PROPOFOL N/A 08/19/2019   Procedure: ESOPHAGOGASTRODUODENOSCOPY (EGD) WITH PROPOFOL;  Surgeon: [name], MD;  Location: [place];  Service: Gastroenterology;  Laterality: N/A;   ESOPHAGOGASTRODUODENOSCOPY (EGD) WITH PROPOFOL N/A 02/11/2020   Procedure: ESOPHAGOGASTRODUODENOSCOPY (EGD) WITH PROPOFOL;  Surgeon: Ronald Lobo, MD;  Location: [place];  Service: Endoscopy;  Laterality: N/A;   ESOPHAGOGASTRODUODENOSCOPY (EGD) WITH PROPOFOL N/A 03/05/2020   Procedure: ESOPHAGOGASTRODUODENOSCOPY (EGD) WITH PROPOFOL;  Surgeon: [name], MD;  Location: [place];  Service: Gastroenterology;  Laterality: N/A;   EXCISION VULVAR LESIONS  07/2012   GIVENS CAPSULE STUDY N/A 02/12/2020   Procedure: GIVENS CAPSULE STUDY;  Surgeon: Ronald Lobo, MD;  Location: [facility];  Service: Endoscopy;  Laterality: N/A;   HEMORRHOID SURGERY N/A 04/15/2013   Procedure: Excision of external and internal  hemorrhoid with AIN, Exam under anesthesia;  Surgeon: Odis Hollingshead, MD;  Location: [facility];  Service: General;  Laterality: N/A;   HIP PINNING,CANNULATED Right 03/15/2019   Procedure: Maebelle Munroe OF FEMORAL NECK FRACTURE;  Surgeon: Renette Butters, MD;  Location: [facility];  Service: Orthopedics;  Laterality: Right;   POLYPECTOMY  08/19/2019   Procedure: POLYPECTOMY;  Surgeon: [name], MD;  Location: [facility];  Service: Gastroenterology;;   REPAIR FISTULA IN ANO/  I & D PERIRECTAL ABSCESS  10-26-2002  DR MATTHEW MARTIN   RIGHT/LEFT HEART CATH AND CORONARY ANGIOGRAPHY N/A 01/03/2020   Procedure: RIGHT/LEFT HEART CATH AND CORONARY ANGIOGRAPHY;  Surgeon: Belva Crome, MD;  Location: [facility];  Service: Cardiovascular;  Laterality: N/A;   TRANSTHORACIC ECHOCARDIOGRAM  06-23-2012   NORMAL LVSF/ EF 55-60%   VAGINAL HYSTERECTOMY  1997   partial   VULVAR LESION REMOVAL N/A 11/03/2014   Procedure: CO2 LASER OF VULVAR ;  Surgeon: [name], MD;   Location: [place];  Service: Gynecology;  Laterality: N/A;   VULVECTOMY N/A 03/26/2015   Procedure: WIDE LOCA  EXCISION OF VULVAR;  Surgeon: [name], MD;  Location: [facility];  Service: Gynecology;  Laterality: N/A;   Wide Local Excision of labia majora  04-04-2010   Right-sided lesion, CO2 ablation of right labia minora    Allergies  Allergen Reactions   Latex Swelling and Other (See Comments)     fever blisters   Augmentin [Amoxicillin-Pot Clavulanate] Nausea And Vomiting   Doxycycline Other (See Comments)    Blurred vision, nervous, unsteady   Hydrocodone Itching    Acetaminophen:itching, confusion, hallucinations-side effects-Onset date 07/15/2018   Lactose Intolerance (Gi) Other (See Comments)    Unknown, per pt   Lialda [Mesalamine] Diarrhea   Prinivil [Lisinopril] Diarrhea   Triamcinolone Acetonide Other (See Comments)    REDNESS AND PAIN   Amlodipine Rash   Lotensin [Benazepril Hcl] Rash    Outpatient Encounter Medications as of 04/03/2020  Medication Sig   acetaminophen (TYLENOL) 325 MG tablet Take 650 mg by mouth every 6 (six) hours as needed for mild pain.    ALPRAZolam (XANAX) 0.5 MG tablet Take 1 tablet (  0.5 mg total) by mouth 3 (three) times daily as needed for anxiety.   atorvastatin (LIPITOR) 80 MG tablet Take 1 tablet (80 mg total) by mouth daily.   busPIRone (BUSPAR) 30 MG tablet Take 30 mg by mouth 2 (two) times daily.    calcium carbonate (OSCAL) 1500 (600 Ca) MG TABS tablet Take 600 mg of elemental calcium by mouth daily.   carvedilol (COREG) 12.5 MG tablet Take 12.5 mg by mouth 2 (two) times daily with a meal. Patient is taking 1/2 tablet of 12.5 mg tablet   denosumab (PROLIA) 60 MG/ML SOSY injection Inject 60 mg into the skin every 6 (six) months.   ferrous sulfate 325 (65 FE) MG tablet Take 325 mg by mouth daily with breakfast.   furosemide (LASIX) 40 MG tablet Take 1 tablet (40 mg total) by mouth  daily.   hydroxychloroquine (PLAQUENIL) 200 MG tablet Take 200 mg by mouth daily.   isosorbide mononitrate (IMDUR) 30 MG 24 hr tablet Take 0.5 tablets (15 mg total) by mouth daily.   loperamide (IMODIUM) 2 MG capsule Take 1 capsule (2 mg total) by mouth every 6 (six) hours as needed for diarrhea or loose stools.   mirtazapine (REMERON) 15 MG tablet Take 1 tablet (15 mg total) by mouth at bedtime.   mirtazapine (REMERON) 7.5 MG tablet Take 7.5 mg by mouth at bedtime.   Multiple Vitamin (MULTIVITAMIN WITH MINERALS) TABS tablet Take 1 tablet by mouth daily.   Polyethyl Glycol-Propyl Glycol (SYSTANE FREE OP) Place 1 drop into both eyes daily as needed (For Dry eyes).   predniSONE (DELTASONE) 5 MG tablet Take 5 mg by mouth daily.   saccharomyces boulardii (FLORASTOR) 250 MG capsule Take 1 capsule (250 mg total) by mouth 2 (two) times daily.   spironolactone (ALDACTONE) 25 MG tablet Take 0.5 tablets (12.5 mg total) by mouth daily.   ciprofloxacin (CIPRO) 500 MG tablet Take 1 tablet (500 mg total) by mouth 2 (two) times daily for 7 days.   Nutritional Supplements (ENSURE ACTIVE HIGH PROTEIN) LIQD Take 1 Can by mouth daily.   No facility-administered encounter medications on file as of 04/03/2020.    Review of Systems  Constitutional: Negative for appetite change, chills, fatigue and fever.  Respiratory: Negative for cough, chest tightness, shortness of breath and wheezing.   Cardiovascular: Negative for chest pain, palpitations and leg swelling.  Gastrointestinal: Positive for abdominal pain. Negative for abdominal distention, constipation, diarrhea, nausea and vomiting.  Genitourinary: Positive for dysuria and frequency. Negative for difficulty urinating, flank pain and urgency.  Musculoskeletal: Positive for back pain and gait problem.  Skin: Positive for wound. Negative for color change and pallor.  Neurological: Negative for dizziness, speech difficulty, light-headedness, numbness and  headaches.  Psychiatric/Behavioral: Negative for agitation and sleep disturbance. The patient is not nervous/anxious.     Immunization History  Administered Date(s) Administered   Influenza, High Dose Seasonal PF 07/13/2017   PFIZER SARS-COV-2 Vaccination 11/22/2019, 12/13/2019   Tdap 09/11/2018   Pertinent  Health Maintenance Due  Topic Date Due   PNA vac Low Risk Adult (1 of 2 - PCV13) Never done   INFLUENZA VACCINE  04/01/2020   COLONOSCOPY  08/18/2029   DEXA SCAN  Completed   Fall Risk  04/03/2020 03/16/2020 02/22/2020 01/16/2020 12/12/2019  Falls in the past year? 0 0 0 0 0  Number falls in past yr: 0 0 0 0 0  Injury with Fall? 0 0 0 0 0   Vitals:   04/03/20 1322  BP: 118/62  Pulse: 96  Temp: 97.7 F (36.5 C)  SpO2: 98%  Weight: 93 lb (42.2 kg)  Height: 5\' 4"  (1.626 m)   Body mass index is 15.96 kg/m. Physical Exam Vitals reviewed.  Constitutional:      General: She is not in acute distress.    Appearance: She is underweight. She is not ill-appearing.  Eyes:     General: No scleral icterus.       Right eye: No discharge.        Left eye: No discharge.     Conjunctiva/sclera: Conjunctivae normal.     Pupils: Pupils are equal, round, and reactive to light.  Cardiovascular:     Rate and Rhythm: Normal rate and regular rhythm.     Pulses: Normal pulses.     Heart sounds: Murmur heard.  No friction rub. No gallop.   Pulmonary:     Effort: Pulmonary effort is normal. No respiratory distress.     Breath sounds: Normal breath sounds. No wheezing, rhonchi or rales.  Chest:     Chest wall: No tenderness.  Abdominal:     General: Bowel sounds are normal. There is no distension.     Palpations: Abdomen is soft. There is no mass.     Tenderness: There is no abdominal tenderness. There is no right CVA tenderness, left CVA tenderness, guarding or rebound.  Musculoskeletal:        General: No swelling or tenderness.     Right lower leg: No edema.     Left lower  leg: No edema.     Comments: Unsteady gait   Skin:    General: Skin is warm.     Findings: No bruising or rash.     Comments: 1. Left sacral area stage 1 pressure ulcer with skin erythema on entire sacral area.No drainage noted and non-tender to palpation.   2. Left labia 5"Oclock position with erythema tender to touch.lower adjust gluteal area with erythema tender to touch   Neurological:     Mental Status: She is alert. Mental status is at baseline.     Cranial Nerves: No cranial nerve deficit.     Gait: Gait abnormal.  Psychiatric:        Mood and Affect: Mood normal.        Behavior: Behavior normal.        Thought Content: Thought content normal.     Labs reviewed: Recent Labs    01/06/20 0820 02/09/20 1317 03/06/20 0425 03/06/20 1230 03/07/20 0834 03/07/20 0834 03/08/20 0504 03/16/20 1509 03/26/20 1058  NA 124*   < > 122*   < > 127*   < > 126* 130* 128*  K 4.2   < > 4.1   < > 3.4*   < > 4.7 4.5 4.1  CL 94*   < > 93*   < > 95*   < > 97* 96* 92*  CO2 25   < > 21*   < > 22   < > 21* 24 21  GLUCOSE 111*   < > 109*   < > 170*   < > 105* 120 101*  BUN 28*   < > 30*   < > 26*   < > 25* 17 12  CREATININE 1.07*   < > 1.61*   < > 1.57*   < > 1.54* 1.31* 1.28*  CALCIUM 7.9*   < > 8.2*   < > 8.2*   < > 8.1* 9.8  8.9  MG 2.7*  --  1.9  --  1.7  --   --   --   --   PHOS  --   --  3.3  --  2.3*  --   --   --   --    < > = values in this interval not displayed.   Recent Labs    03/06/20 0425 03/06/20 0425 03/07/20 0834 03/08/20 0504 03/16/20 1509  AST 22   < > 25 22 23   ALT 26   < > 26 23 24   ALKPHOS 35*  --  39 35*  --   BILITOT 0.6   < > 0.7 0.8 0.4  PROT 4.9*   < > 5.1* 4.8* 6.0*  ALBUMIN 2.6*  --  2.7* 2.4*  --    < > = values in this interval not displayed.   Recent Labs    03/06/20 0425 03/06/20 0425 03/07/20 0834 03/07/20 0834 03/08/20 0504 03/16/20 1509 03/26/20 1058  WBC 6.9   < > 8.4   < > 6.9 9.6 7.7  NEUTROABS 4.5  --  6.4  --   --  7,536  --     HGB 8.7*   < > 9.1*   < > 8.1* 9.3* 9.5*  HCT 25.7*   < > 27.2*   < > 23.8* 27.5* 28.7*  MCV 92.4   < > 94.8   < > 95.2 94.8 96  PLT 170   < > 169   < > 161 186 220   < > = values in this interval not displayed.   Lab Results  Component Value Date   TSH 1.040 03/06/2020   Lab Results  Component Value Date   HGBA1C 5.4 09/24/2017   Lab Results  Component Value Date   CHOL 143 09/24/2017   HDL 96 09/24/2017   LDLCALC 42 09/24/2017   TRIG 24 09/24/2017   CHOLHDL 1.5 09/24/2017    Significant Diagnostic Results in last 30 days:  DG CHEST PORT 1 VIEW  Result Date: 03/06/2020 CLINICAL DATA:  Shortness of breath. EXAM: PORTABLE CHEST 1 VIEW COMPARISON:  March 04, 2020. FINDINGS: Stable cardiomegaly. No pneumothorax or pleural effusion is noted. Both lungs are clear. The visualized skeletal structures are unremarkable. IMPRESSION: No active disease. Aortic Atherosclerosis (ICD10-I70.0). Electronically Signed   By: Marijo Conception M.D.   On: 03/06/2020 10:26    Assessment/Plan 1. Symptoms of urinary tract infection Afebrile.Urine dip stick shows large leukocytes,positive nitrites and trace blood.Will send urine for culture and start on cipro as below.Aware might need to change antibiotics if culture not sensitivity to Cipro. - Culture, Urine - ciprofloxacin (CIPRO) 500 MG tablet; Take 1 tablet (500 mg total) by mouth 2 (two) times daily for 7 days.  Dispense: 14 tablet; Refill: 0  2. Pressure ulcer of sacral region, stage 1 Left sacral area stage 1 pressure ulcer with erythema on entire sacral areas..no drainage noted.will order home health Nurse for wound care.Has [facility] health for therapy already and [facility] nurse monthly. - For home use only DME Other see comment:  [place] for wound care  - For home use only DME Other see comment: Gel cushion for pressure relief.Advised to use cushion when sitting on chair. - Nutritional Supplements (ENSURE ACTIVE HIGH PROTEIN) LIQD; Take 1  Can by mouth daily.  Dispense: 237 mL; Refill: 3  Family/ staff Communication: Reviewed plan of care with patient and care giver.  Labs/tests ordered: -  Culture, Urine   Next Appointment: As needed if symptoms worsen or fail to improve.  Sandrea Hughs, NP

## 2020-04-04 ENCOUNTER — Telehealth: Payer: Self-pay

## 2020-04-04 DIAGNOSIS — D62 Acute posthemorrhagic anemia: Secondary | ICD-10-CM | POA: Diagnosis not present

## 2020-04-04 DIAGNOSIS — I25119 Atherosclerotic heart disease of native coronary artery with unspecified angina pectoris: Secondary | ICD-10-CM | POA: Diagnosis not present

## 2020-04-04 DIAGNOSIS — I0981 Rheumatic heart failure: Secondary | ICD-10-CM | POA: Diagnosis not present

## 2020-04-04 DIAGNOSIS — I088 Other rheumatic multiple valve diseases: Secondary | ICD-10-CM | POA: Diagnosis not present

## 2020-04-04 DIAGNOSIS — J441 Chronic obstructive pulmonary disease with (acute) exacerbation: Secondary | ICD-10-CM | POA: Diagnosis not present

## 2020-04-04 DIAGNOSIS — G834 Cauda equina syndrome: Secondary | ICD-10-CM | POA: Diagnosis not present

## 2020-04-04 DIAGNOSIS — M8580 Other specified disorders of bone density and structure, unspecified site: Secondary | ICD-10-CM | POA: Diagnosis not present

## 2020-04-04 DIAGNOSIS — I7300 Raynaud's syndrome without gangrene: Secondary | ICD-10-CM | POA: Diagnosis not present

## 2020-04-04 DIAGNOSIS — K529 Noninfective gastroenteritis and colitis, unspecified: Secondary | ICD-10-CM | POA: Diagnosis not present

## 2020-04-04 DIAGNOSIS — K922 Gastrointestinal hemorrhage, unspecified: Secondary | ICD-10-CM | POA: Diagnosis not present

## 2020-04-04 DIAGNOSIS — D631 Anemia in chronic kidney disease: Secondary | ICD-10-CM | POA: Diagnosis not present

## 2020-04-04 DIAGNOSIS — M810 Age-related osteoporosis without current pathological fracture: Secondary | ICD-10-CM | POA: Diagnosis not present

## 2020-04-04 DIAGNOSIS — M5441 Lumbago with sciatica, right side: Secondary | ICD-10-CM | POA: Diagnosis not present

## 2020-04-04 DIAGNOSIS — F329 Major depressive disorder, single episode, unspecified: Secondary | ICD-10-CM | POA: Diagnosis not present

## 2020-04-04 DIAGNOSIS — I4892 Unspecified atrial flutter: Secondary | ICD-10-CM | POA: Diagnosis not present

## 2020-04-04 DIAGNOSIS — K6282 Dysplasia of anus: Secondary | ICD-10-CM | POA: Diagnosis not present

## 2020-04-04 DIAGNOSIS — I447 Left bundle-branch block, unspecified: Secondary | ICD-10-CM | POA: Diagnosis not present

## 2020-04-04 DIAGNOSIS — M5417 Radiculopathy, lumbosacral region: Secondary | ICD-10-CM | POA: Diagnosis not present

## 2020-04-04 DIAGNOSIS — M170 Bilateral primary osteoarthritis of knee: Secondary | ICD-10-CM | POA: Diagnosis not present

## 2020-04-04 DIAGNOSIS — I130 Hypertensive heart and chronic kidney disease with heart failure and stage 1 through stage 4 chronic kidney disease, or unspecified chronic kidney disease: Secondary | ICD-10-CM | POA: Diagnosis not present

## 2020-04-04 DIAGNOSIS — N1832 Chronic kidney disease, stage 3b: Secondary | ICD-10-CM | POA: Diagnosis not present

## 2020-04-04 DIAGNOSIS — I482 Chronic atrial fibrillation, unspecified: Secondary | ICD-10-CM | POA: Diagnosis not present

## 2020-04-04 DIAGNOSIS — L89151 Pressure ulcer of sacral region, stage 1: Secondary | ICD-10-CM

## 2020-04-04 DIAGNOSIS — I5043 Acute on chronic combined systolic (congestive) and diastolic (congestive) heart failure: Secondary | ICD-10-CM | POA: Diagnosis not present

## 2020-04-04 DIAGNOSIS — F419 Anxiety disorder, unspecified: Secondary | ICD-10-CM | POA: Diagnosis not present

## 2020-04-04 DIAGNOSIS — M0600 Rheumatoid arthritis without rheumatoid factor, unspecified site: Secondary | ICD-10-CM | POA: Diagnosis not present

## 2020-04-04 NOTE — Telephone Encounter (Signed)
Since patient was not being seeing for Pressure ulcer would like Nurse for wound care.

## 2020-04-04 NOTE — Telephone Encounter (Signed)
Veronia from [facility] [phone]) called and stated effective today patient was not being covered by home health [facility]) and wanted to know what the course would be going forward, as she was not being seen for treatment of her sacral wound.   The patient also called and stated that the insurance company said she had used all her days, but therapy did see her today.   Please advise.    Notation from her visit on 04/04/20.   2. Pressure ulcer of sacral region, stage 1 Left sacral area stage 1 pressure ulcer with erythema on entire sacral areas..no drainage noted.will order home health Nurse for wound care.Has [facility] health for therapy already and [facility] nurse monthly. - For home use only DME Other see comment:  [place] for wound care  - For home use only DME Other see comment: Gel cushion for pressure relief.Advised to use cushion when sitting on chair. - Nutritional Supplements (ENSURE ACTIVE HIGH PROTEIN) LIQD; Take 1 Can by mouth daily.  Dispense: 237 mL; Refill: 3

## 2020-04-05 NOTE — Telephone Encounter (Signed)
Called Veronica with [facility] back and she stated we would need to put in a new referral to [facility] for a nurse for wound care. Referral made and pended to NP.

## 2020-04-05 NOTE — Telephone Encounter (Signed)
Home health referral for [facility] Nurse ordered.

## 2020-04-06 LAB — URINE CULTURE
MICRO NUMBER:: 10784847
SPECIMEN QUALITY:: ADEQUATE

## 2020-04-09 ENCOUNTER — Telehealth: Payer: Self-pay | Admitting: *Deleted

## 2020-04-09 ENCOUNTER — Telehealth: Payer: Self-pay | Admitting: Nurse Practitioner

## 2020-04-09 NOTE — Telephone Encounter (Signed)
Ms. Regina Ortiz aware to contact Dr Hollace Kinnier for these orders.

## 2020-04-09 NOTE — Telephone Encounter (Signed)
Yes, of course I'll be attending for hospice.

## 2020-04-09 NOTE — Telephone Encounter (Signed)
Anne Ng, Family, called and stated that Hospice is going to be calling us requesting verbal order for Hospice. Family is in agreement and would like to start there services.   Awaiting call from Hospice.   Is this ok with you and will you be attending physician.

## 2020-04-09 NOTE — Telephone Encounter (Signed)
Patient's caregiver is calling to follow up in regards to hospice for patient. She states the patient has decided to begin hospice and an order is needed prior to beginning. Please call to discuss.

## 2020-04-09 NOTE — Telephone Encounter (Signed)
These orders will need to come from pt's PCP - Dr Percell Belt.

## 2020-04-17 ENCOUNTER — Ambulatory Visit: Payer: PPO | Admitting: Family

## 2020-04-19 ENCOUNTER — Encounter: Payer: Self-pay | Admitting: Internal Medicine

## 2020-04-19 ENCOUNTER — Other Ambulatory Visit: Payer: PPO

## 2020-04-19 ENCOUNTER — Other Ambulatory Visit: Payer: Self-pay

## 2020-04-19 ENCOUNTER — Ambulatory Visit (INDEPENDENT_AMBULATORY_CARE_PROVIDER_SITE_OTHER): Payer: Medicare Other | Admitting: Internal Medicine

## 2020-04-19 VITALS — BP 122/60 | HR 74 | Temp 97.0°F | Ht 64.0 in | Wt 94.6 lb

## 2020-04-19 DIAGNOSIS — L89151 Pressure ulcer of sacral region, stage 1: Secondary | ICD-10-CM

## 2020-04-19 DIAGNOSIS — I5042 Chronic combined systolic (congestive) and diastolic (congestive) heart failure: Secondary | ICD-10-CM

## 2020-04-19 DIAGNOSIS — F409 Phobic anxiety disorder, unspecified: Secondary | ICD-10-CM

## 2020-04-19 DIAGNOSIS — Z1159 Encounter for screening for other viral diseases: Secondary | ICD-10-CM | POA: Diagnosis not present

## 2020-04-19 DIAGNOSIS — F411 Generalized anxiety disorder: Secondary | ICD-10-CM

## 2020-04-19 DIAGNOSIS — K591 Functional diarrhea: Secondary | ICD-10-CM

## 2020-04-19 DIAGNOSIS — I5043 Acute on chronic combined systolic (congestive) and diastolic (congestive) heart failure: Secondary | ICD-10-CM

## 2020-04-19 DIAGNOSIS — Z7189 Other specified counseling: Secondary | ICD-10-CM

## 2020-04-19 DIAGNOSIS — F5105 Insomnia due to other mental disorder: Secondary | ICD-10-CM

## 2020-04-19 DIAGNOSIS — R54 Age-related physical debility: Secondary | ICD-10-CM | POA: Diagnosis not present

## 2020-04-19 NOTE — Patient Instructions (Signed)
Please discuss your MOST form with your son.  The [facility] NP or myself can complete it for you.

## 2020-04-19 NOTE — Progress Notes (Signed)
Location:  [facility] clinic Provider:  Mystie Ormand L. Mariea Clonts, D.O., C.M.D.  Code Status: DNR discussed today and she has agreed to DNR code status Goals of Care:  Advanced Directives 04/19/2020  Does Patient Have a Medical Advance Directive? Yes  Type of Advance Directive Living will  Does patient want to make changes to medical advance directive? No - Patient declined  Copy of [place] in Chart? Yes - validated most recent copy scanned in chart (See row information)  Would patient like information on creating a medical advance directive? -  Pre-existing out of facility DNR order (yellow form or pink MOST form) -     Chief Complaint  Patient presents with  . Medical Management of Chronic Issues    3 month follow up and to discuss podiatry appointment   . Medication Management    Xanax is not working, she would like and increase or try something else   . Acute Visit    Health Care POA and Most form ([place])    HPI: Patient is a 74 y.o. female seen today for medical management of chronic diseases.    Needs me to check her vagina and buttocks.  Vagina itched last night.  She is now under [facility] hospice.  Nurse checked her foot and she'd like me to see it also.    Breathing ok since getting home.  Does circles walking--three of them.    Says her xanax is not working anymore.  Says it's the main reason she is here.  She gets so anxious "about everything".  She can tell it's working for just a little while, then it's like she did not take it.  At low dose tid. Also on buspar 30mg  po bid.  Also on remeron 15mg  qhs.  Doing better sleeping at night.  Wakes up 2-3x at night, some to go to the bathroom.  She's tried all she could think of to get comfortable.    She has accepted that she's not going to go back to where she was.    She and her son, Arlee Muslim, need to go to the bank to get the power of attorney notarized.  Her nephew lives too far away in [place].     Had discussion about limited interventions vs comfort measures and could not get her by herself to commit to a choice.  Past Medical History:  Diagnosis Date  . Anemia   . Anxiety   . Bruises easily   . CAD (coronary artery disease)    Bifurcational LAD/diagonal disease managed medically  . Cardiomyopathy ([place])   . Chronic hyponatremia   . Colitis   . COPD (chronic obstructive pulmonary disease) ([place])   . Depression   . Essential hypertension   . GERD (gastroesophageal reflux disease)   . GI bleed   . History of atrial fibrillation   . History of chronic bronchitis   . Insomnia   . LBBB (left bundle branch block)   . Macrocytosis   . Major depressive disorder   . Nocturia   . Osteoporosis   . Raynaud's disease   . Sciatica    Right side  . Seronegative rheumatoid arthritis ([place])   . Venous insufficiency of leg   . VIN III (vulvar intraepithelial neoplasia III)    Recurrent  . Vitamin B12 deficiency     Past Surgical History:  Procedure Laterality Date  . APPENDECTOMY  1992  . BIOPSY  08/19/2019   Procedure: BIOPSY;  Surgeon: [name], MD;  Location: [facility] ENDOSCOPY;  Service: Gastroenterology;;  . CARDIOVASCULAR STRESS TEST  06-23-2012  DR Jenkins [place]   NORMAL LEXISCAN MYOVIEW/ EF 83%/  NO ISCHEMIA  . CO2 LASER APPLICATION  67/89/3810   Procedure: CO2 LASER APPLICATION;  Surgeon: Janie Morning, MD PHD;  Location: [facility];  Service: Gynecology;  Laterality: N/A;  Laser Vaporization  . CO2 LASER APPLICATION N/A 09/07/5100   Procedure: CO2 LASER APPLICATION;  Surgeon: [name], MD;  Location[facility];  Service: Gynecology;  Laterality: N/A;  . CO2 LASER APPLICATION N/A 5/85/2778   Procedure: CO2 LASER APPLICATION of the vulva;  Surgeon: Janie Morning, MD;  Location: [facility];  Service: Gynecology;  Laterality: N/A;  . COLONOSCOPY WITH PROPOFOL N/A 08/19/2019   Procedure: COLONOSCOPY WITH PROPOFOL;  Surgeon:  [name], MD;  Location: [place];  Service: Gastroenterology;  Laterality: N/A;  . ESOPHAGOGASTRODUODENOSCOPY (EGD) WITH PROPOFOL N/A 08/19/2019   Procedure: ESOPHAGOGASTRODUODENOSCOPY (EGD) WITH PROPOFOL;  Surgeon: [name], MD;  Location: [place];  Service: Gastroenterology;  Laterality: N/A;  . ESOPHAGOGASTRODUODENOSCOPY (EGD) WITH PROPOFOL N/A 02/11/2020   Procedure: ESOPHAGOGASTRODUODENOSCOPY (EGD) WITH PROPOFOL;  Surgeon: Ronald Lobo, MD;  Location: [place];  Service: Endoscopy;  Laterality: N/A;  . ESOPHAGOGASTRODUODENOSCOPY (EGD) WITH PROPOFOL N/A 03/05/2020   Procedure: ESOPHAGOGASTRODUODENOSCOPY (EGD) WITH PROPOFOL;  Surgeon: [name], MD;  Location: [place];  Service: Gastroenterology;  Laterality: N/A;  . EXCISION VULVAR LESIONS  07/2012  . GIVENS CAPSULE STUDY N/A 02/12/2020   Procedure: GIVENS CAPSULE STUDY;  Surgeon: Ronald Lobo, MD;  Location[facility] ENDOSCOPY;  Service: Endoscopy;  Laterality: N/A;  . HEMORRHOID SURGERY N/A 04/15/2013   Procedure: Excision of external and internal  hemorrhoid with AIN, Exam under anesthesia;  Surgeon: Odis Hollingshead, MD;  Location: [facility];  Service: General;  Laterality: N/A;  . HIP PINNING,CANNULATED Right 03/15/2019   Procedure: Maebelle Munroe OF FEMORAL NECK FRACTURE;  Surgeon: Renette Butters, MD;  Location: [place];  Service: Orthopedics;  Laterality: Right;  . POLYPECTOMY  08/19/2019   Procedure: POLYPECTOMY;  Surgeon: [name], MD;  Location: [facility] ENDOSCOPY;  Service: Gastroenterology;;  . REPAIR FISTULA IN ANO/  I & D PERIRECTAL ABSCESS  10-26-2002  DR Johnathan Hausen  . RIGHT/LEFT HEART CATH AND CORONARY ANGIOGRAPHY N/A 01/03/2020   Procedure: RIGHT/LEFT HEART CATH AND CORONARY ANGIOGRAPHY;  Surgeon: Belva Crome, MD;  Location: [place] [facility];  Service: Cardiovascular;  Laterality: N/A;  . TRANSTHORACIC ECHOCARDIOGRAM  06-23-2012   NORMAL LVSF/ EF 55-60%  . VAGINAL HYSTERECTOMY  1997   partial  . VULVAR  LESION REMOVAL N/A 11/03/2014   Procedure: CO2 LASER OF VULVAR ;  Surgeon: [name], MD;  Location: [facility];  Service: Gynecology;  Laterality: N/A;  . VULVECTOMY N/A 03/26/2015   Procedure: WIDE LOCA  EXCISION OF VULVAR;  Surgeon: [name], MD;  Location: [place];  Service: Gynecology;  Laterality: N/A;  . Wide Local Excision of labia majora  04-04-2010   Right-sided lesion, CO2 ablation of right labia minora    Allergies  Allergen Reactions  . Latex Swelling and Other (See Comments)     fever blisters  . Augmentin [Amoxicillin-Pot Clavulanate] Nausea And Vomiting  . Doxycycline Other (See Comments)    Blurred vision, nervous, unsteady  . Hydrocodone Itching    Acetaminophen:itching, confusion, hallucinations-side effects-Onset date 07/15/2018  . Lactose Intolerance (Gi) Other (See Comments)    Unknown, per pt  .  Lialda [Mesalamine] Diarrhea  . Prinivil [Lisinopril] Diarrhea  . Triamcinolone Acetonide Other (See Comments)    REDNESS AND PAIN  . Amlodipine Rash  . Lotensin [Benazepril Hcl] Rash    Outpatient Encounter Medications as of 04/19/2020  Medication Sig  . acetaminophen (TYLENOL) 325 MG tablet Take 650 mg by mouth every 6 (six) hours as needed for mild pain.   Marland Kitchen ALPRAZolam (XANAX) 0.5 MG tablet Take 1 tablet (0.5 mg total) by mouth 3 (three) times daily as needed for anxiety.  Marland Kitchen atorvastatin (LIPITOR) 80 MG tablet Take 1 tablet (80 mg total) by mouth daily.  . busPIRone (BUSPAR) 30 MG tablet Take 30 mg by mouth 2 (two) times daily.   . calcium carbonate (OSCAL) 1500 (600 Ca) MG TABS tablet Take 600 mg of elemental calcium by mouth daily.  . carvedilol (COREG) 12.5 MG tablet Take 12.5 mg by mouth 2 (two) times daily with a meal. Patient is taking 1/2 tablet of 12.5 mg tablet  . denosumab (PROLIA) 60 MG/ML SOSY injection Inject 60 mg into the skin every 6 (six) months.  . ferrous sulfate 325 (65 FE) MG tablet Take 325 mg by mouth daily  with breakfast.  . furosemide (LASIX) 40 MG tablet Take 1 tablet (40 mg total) by mouth daily.  . hydroxychloroquine (PLAQUENIL) 200 MG tablet Take 200 mg by mouth daily.  . isosorbide mononitrate (IMDUR) 30 MG 24 hr tablet Take 0.5 tablets (15 mg total) by mouth daily.  Marland Kitchen loperamide (IMODIUM) 2 MG capsule Take 1 capsule (2 mg total) by mouth every 6 (six) hours as needed for diarrhea or loose stools.  . mirtazapine (REMERON) 15 MG tablet Take 1 tablet (15 mg total) by mouth at bedtime.  . Multiple Vitamin (MULTIVITAMIN WITH MINERALS) TABS tablet Take 1 tablet by mouth daily.  . Nutritional Supplements (ENSURE ACTIVE HIGH PROTEIN) LIQD Take 1 Can by mouth daily.  Vladimir Faster Glycol-Propyl Glycol (SYSTANE FREE OP) Place 1 drop into both eyes daily as needed (For Dry eyes).  . predniSONE (DELTASONE) 5 MG tablet Take 5 mg by mouth daily.  Marland Kitchen saccharomyces boulardii (FLORASTOR) 250 MG capsule Take 1 capsule (250 mg total) by mouth 2 (two) times daily.  Marland Kitchen spironolactone (ALDACTONE) 25 MG tablet Take 0.5 tablets (12.5 mg total) by mouth daily.   No facility-administered encounter medications on file as of 04/19/2020.    Review of Systems:  Review of Systems  Constitutional: Positive for malaise/fatigue. Negative for chills and fever.  HENT: Negative for congestion and sore throat.   Eyes: Negative for blurred vision.  Respiratory: Negative for cough and shortness of breath.   Cardiovascular: Negative for chest pain, palpitations and leg swelling.  Gastrointestinal: Negative for abdominal pain, blood in stool, constipation, diarrhea and melena.  Genitourinary: Negative for dysuria.       Some vaginal irritation, buttock pain  Musculoskeletal: Negative for falls and joint pain.  Skin: Negative for itching and rash.  Neurological: Negative for dizziness and loss of consciousness.  Psychiatric/Behavioral: Positive for depression and memory loss. The patient is nervous/anxious. The patient does not  have insomnia.     Health Maintenance  Topic Date Due  . Hepatitis C Screening  Never done  . PNA vac Low Risk Adult (1 of 2 - PCV13) Never done  . INFLUENZA VACCINE  04/01/2020  . TETANUS/TDAP  09/11/2028  . COLONOSCOPY  08/18/2029  . DEXA SCAN  Completed  . COVID-19 Vaccine  Completed    Physical Exam: Vitals:  04/19/20 1422  BP: 122/60  Pulse: 74  Temp: (!) 97 F (36.1 C)  TempSrc: Temporal  SpO2: 93%  Weight: 94 lb 9.6 oz (42.9 kg)  Height: 5\' 4"  (1.626 m)   Body mass index is 16.24 kg/m. Physical Exam Vitals reviewed.  Constitutional:      General: She is not in acute distress.    Appearance: Normal appearance. She is not toxic-appearing.     Comments: [place] frail female  HENT:     Head: Normocephalic and atraumatic.  Eyes:     Comments: glasses  Cardiovascular:     Rate and Rhythm: Rhythm irregular.     Heart sounds: No murmur heard.   Pulmonary:     Effort: Pulmonary effort is normal.     Breath sounds: Normal breath sounds. No wheezing, rhonchi or rales.  Abdominal:     General: Bowel sounds are normal.     Palpations: Abdomen is soft.     Tenderness: There is no abdominal tenderness.  Musculoskeletal:        General: Normal range of motion.     Cervical back: Neck supple.     Right lower leg: No edema.     Left lower leg: No edema.     Comments: Wearing compression socks  Skin:    General: Skin is warm and dry.     Coloration: Skin is pale.     Comments: Has ulceration over left lateral foot--stage 2 at present with nice pink base   Neurological:     General: No focal deficit present.     Mental Status: She is alert and oriented to person, place, and time.  Psychiatric:        Mood and Affect: Mood normal.        Behavior: Behavior normal.     Labs reviewed: Basic Metabolic Panel: Recent Labs    12/31/19 0644 12/31/19 0939 01/06/20 0820 02/09/20 1317 03/06/20 0425 03/06/20 1230 03/06/20 1500 03/06/20 1507 03/07/20 0834  03/07/20 0834 03/08/20 0504 03/16/20 1509 03/26/20 1058  NA  --    < > 124*   < > 122*   < >   < >  --  127*   < > 126* 130* 128*  K  --    < > 4.2   < > 4.1   < >   < >  --  3.4*   < > 4.7 4.5 4.1  CL  --    < > 94*   < > 93*   < >   < >  --  95*   < > 97* 96* 92*  CO2  --    < > 25   < > 21*   < >   < >  --  22   < > 21* 24 21  GLUCOSE  --    < > 111*   < > 109*   < >   < >  --  170*   < > 105* 120 101*  BUN  --    < > 28*   < > 30*   < >   < >  --  26*   < > 25* 17 12  CREATININE  --    < > 1.07*   < > 1.61*   < >   < >  --  1.57*   < > 1.54* 1.31* 1.28*  CALCIUM  --    < > 7.9*   < >  8.2*   < >   < >  --  8.2*   < > 8.1* 9.8 8.9  MG  --    < > 2.7*  --  1.9  --   --   --  1.7  --   --   --   --   PHOS  --   --   --   --  3.3  --   --   --  2.3*  --   --   --   --   TSH 2.889  --   --   --   --   --   --  1.040  --   --   --   --   --    < > = values in this interval not displayed.   Liver Function Tests: Recent Labs    03/06/20 0425 03/06/20 0425 03/07/20 0834 03/08/20 0504 03/16/20 1509  AST 22   < > 25 22 23   ALT 26   < > 26 23 24   ALKPHOS 35*  --  39 35*  --   BILITOT 0.6   < > 0.7 0.8 0.4  PROT 4.9*   < > 5.1* 4.8* 6.0*  ALBUMIN 2.6*  --  2.7* 2.4*  --    < > = values in this interval not displayed.   No results for input(s): LIPASE, AMYLASE in the last 8760 hours. No results for input(s): AMMONIA in the last 8760 hours. CBC: Recent Labs    03/06/20 0425 03/06/20 0425 03/07/20 0834 03/07/20 0834 03/08/20 0504 03/16/20 1509 03/26/20 1058  WBC 6.9   < > 8.4   < > 6.9 9.6 7.7  NEUTROABS 4.5  --  6.4  --   --  7,536  --   HGB 8.7*   < > 9.1*   < > 8.1* 9.3* 9.5*  HCT 25.7*   < > 27.2*   < > 23.8* 27.5* 28.7*  MCV 92.4   < > 94.8   < > 95.2 94.8 96  PLT 170   < > 169   < > 161 186 220   < > = values in this interval not displayed.   Lipid Panel: No results for input(s): CHOL, HDL, LDLCALC, TRIG, CHOLHDL, LDLDIRECT in the last 8760 hours. Lab Results   Component Value Date   HGBA1C 5.4 09/24/2017     Assessment/Plan 1. ACP (advance care planning) -spent at least 30 mins discussing MOST form with her -we could not get beyond the DNR that she agreed to--she could not decide b/w limited additional interventions and comfort care -advised to discuss with family and that [facility] team could also help with this paperwork if she and her son are able to make decisions b/w our visits  2. Pressure ulcer of sacral region, stage 1 - redness of buttocks, advised continued use of barrier cream and frequent repositioning/offloading, keep clean and dry  3. Generalized anxiety disorder -was wanting more xanax--already on quite a bit for her frail little self so did not grant this--I'm concerned she'll fall and break something and wind up hospitalized again  -also on buspar  4. Chronic combined systolic and diastolic congestive heart failure (HCC) -cont daily lasix, ensure adequate daily hydration--if insufficient, will need to reduce -prn was not working and she kept getting exacerbations  5. Frailty syndrome in geriatric patient -advanced and she's now receiving hospice care but still does not have full MOST in place -did  do official DNR today which will be scanned into [facility]  6. Insomnia due to anxiety and fear -cont remeron for this and to help maintain weight   Next appt:  05/03/2020  Genae Strine L. Cristabel Bicknell, D.O. [facility] [address], [place] [address] Cell Phone (Mon-Fri 8am-5pm):  [phone] On Call:  [phone] & follow prompts after 5pm & weekends Office Phone:  [phone] Office Fax:  [phone]

## 2020-04-20 LAB — COMPLETE METABOLIC PANEL WITH GFR
AG Ratio: 1.4 (calc) (ref 1.0–2.5)
ALT: 27 U/L (ref 6–29)
AST: 29 U/L (ref 10–35)
Albumin: 4 g/dL (ref 3.6–5.1)
Alkaline phosphatase (APISO): 46 U/L (ref 37–153)
BUN/Creatinine Ratio: 20 (calc) (ref 6–22)
BUN: 27 mg/dL — ABNORMAL HIGH (ref 7–25)
CO2: 26 mmol/L (ref 20–32)
Calcium: 9.9 mg/dL (ref 8.6–10.4)
Chloride: 93 mmol/L — ABNORMAL LOW (ref 98–110)
Creat: 1.34 mg/dL — ABNORMAL HIGH (ref 0.60–0.93)
GFR, Est African American: 45 mL/min/{1.73_m2} — ABNORMAL LOW (ref >=60)
GFR, Est Non African American: 39 mL/min/{1.73_m2} — ABNORMAL LOW (ref >=60)
Globulin: 2.8 g/dL (calc) (ref 1.9–3.7)
Glucose, Bld: 118 mg/dL — ABNORMAL HIGH (ref 65–99)
Potassium: 4.3 mmol/L (ref 3.5–5.3)
Sodium: 129 mmol/L — ABNORMAL LOW (ref 135–146)
Total Bilirubin: 0.8 mg/dL (ref 0.2–1.2)
Total Protein: 6.8 g/dL (ref 6.1–8.1)

## 2020-04-20 LAB — TSH: TSH: 0.93 mIU/L (ref 0.40–4.50)

## 2020-04-20 LAB — CBC WITH DIFFERENTIAL/PLATELET
Absolute Monocytes: 360 cells/uL (ref 200–950)
Basophils Absolute: 30 cells/uL (ref 0–200)
Basophils Relative: 0.4 %
Eosinophils Absolute: 30 cells/uL (ref 15–500)
Eosinophils Relative: 0.4 %
HCT: 30.1 % — ABNORMAL LOW (ref 35.0–45.0)
Hemoglobin: 10.3 g/dL — ABNORMAL LOW (ref 11.7–15.5)
Lymphs Abs: 1650 cells/uL (ref 850–3900)
MCH: 32.8 pg (ref 27.0–33.0)
MCHC: 34.2 g/dL (ref 32.0–36.0)
MCV: 95.9 fL (ref 80.0–100.0)
MPV: 9.9 fL (ref 7.5–12.5)
Monocytes Relative: 4.8 %
Neutro Abs: 5430 cells/uL (ref 1500–7800)
Neutrophils Relative %: 72.4 %
Platelets: 252 10*3/uL (ref 140–400)
RBC: 3.14 10*6/uL — ABNORMAL LOW (ref 3.80–5.10)
RDW: 16.5 % — ABNORMAL HIGH (ref 11.0–15.0)
Total Lymphocyte: 22 %
WBC: 7.5 10*3/uL (ref 3.8–10.8)

## 2020-04-20 LAB — HEPATITIS C ANTIBODY
Hepatitis C Ab: NONREACTIVE
SIGNAL TO CUT-OFF: 0.02 (ref <1.00)

## 2020-04-20 NOTE — Progress Notes (Signed)
Anemia has improved. Kidneys are about the same Sodium is mildly low==also about the same Thyroid is ok. No medication changes needed.

## 2020-04-23 ENCOUNTER — Ambulatory Visit: Payer: PPO | Admitting: Internal Medicine

## 2020-04-24 ENCOUNTER — Telehealth: Payer: Self-pay

## 2020-04-24 NOTE — Telephone Encounter (Signed)
I called Regina Ortiz back.  We decided upon a steroid taper and it sounded like Dr. Lyman Speller from hospice had already written one for her, but they just needed my approval.  She's also getting sarna cream.

## 2020-04-24 NOTE — Telephone Encounter (Signed)
Regina Ortiz called and stated she went out to check Regina Ortiz's buttocks because she was complaining about the rash. Soua though it could have been shingle. When Regina Ortiz checked it was not shingles, however she said it is a rash that has speeded all over [name]'s body because of scratching. It is red, raised and painful to the touch. It's located on the buttocks, elbows, neck and torso. She stated she has pictures if you would like to see them. She would like a medication sent in for the rash please? Please advise.

## 2020-04-24 NOTE — Telephone Encounter (Signed)
Hard to know what to do without the pictures.  She did not have this at her appt.  She's already on chronic prednisone 5mg  daily.  We could give her a prednisone taper potentially.

## 2020-04-24 NOTE — Telephone Encounter (Signed)
Seth Bake called back and stated that if she could have your cell phone number she could send you the pictures. Nurse is asking if you could call her directly at [phone] to discuss.  Stated that patient is complaining of Itching and being uncomfortable.

## 2020-04-30 DIAGNOSIS — D509 Iron deficiency anemia, unspecified: Secondary | ICD-10-CM | POA: Diagnosis not present

## 2020-04-30 DIAGNOSIS — R198 Other specified symptoms and signs involving the digestive system and abdomen: Secondary | ICD-10-CM | POA: Diagnosis not present

## 2020-04-30 DIAGNOSIS — K2970 Gastritis, unspecified, without bleeding: Secondary | ICD-10-CM | POA: Diagnosis not present

## 2020-04-30 DIAGNOSIS — F419 Anxiety disorder, unspecified: Secondary | ICD-10-CM | POA: Diagnosis not present

## 2020-05-01 ENCOUNTER — Ambulatory Visit (INDEPENDENT_AMBULATORY_CARE_PROVIDER_SITE_OTHER): Admitting: Cardiology

## 2020-05-01 ENCOUNTER — Other Ambulatory Visit: Payer: Self-pay

## 2020-05-01 ENCOUNTER — Encounter: Payer: Self-pay | Admitting: Cardiology

## 2020-05-01 VITALS — BP 140/80 | HR 88 | Ht 64.0 in | Wt 92.6 lb

## 2020-05-01 DIAGNOSIS — I5043 Acute on chronic combined systolic (congestive) and diastolic (congestive) heart failure: Secondary | ICD-10-CM

## 2020-05-01 DIAGNOSIS — I447 Left bundle-branch block, unspecified: Secondary | ICD-10-CM

## 2020-05-01 DIAGNOSIS — I482 Chronic atrial fibrillation, unspecified: Secondary | ICD-10-CM

## 2020-05-01 NOTE — Patient Instructions (Signed)

## 2020-05-01 NOTE — Progress Notes (Signed)
Cardiology Office Note:    Date:  05/01/2020   ID:  Regina, Ortiz Apr 29, 1946, MRN [id number]  PCP:  Gayland Curry, DO  [facility] Cardiologist:  Candee Furbish, MD  [facility] Electrophysiologist:  None   Referring MD: Gayland Curry, DO    History of Present Illness:    Regina Ortiz is a 74 y.o. female here for follow-up of left bundle branch block, chronic chest pain.  CAD as described below.  Chronic systolic heart failure EF 30%.  In wheelchair.  Now currently receiving care from Dr. Mariea Clonts with palliative care services.  She saw GI earlier today.  Was prescribed Prilosec.  Okay with me.  No further bleeding episodes.  Anxiety.    Past Medical History:  Diagnosis Date  . Anemia   . Anxiety   . Bruises easily   . CAD (coronary artery disease)    Bifurcational LAD/diagonal disease managed medically  . Cardiomyopathy ([place])   . Chronic hyponatremia   . Colitis   . COPD (chronic obstructive pulmonary disease) ([place])   . Depression   . Essential hypertension   . GERD (gastroesophageal reflux disease)   . GI bleed   . History of atrial fibrillation   . History of chronic bronchitis   . Insomnia   . LBBB (left bundle branch block)   . Macrocytosis   . Major depressive disorder   . Nocturia   . Osteoporosis   . Raynaud's disease   . Sciatica    Right side  . Seronegative rheumatoid arthritis ([place])   . Venous insufficiency of leg   . VIN III (vulvar intraepithelial neoplasia III)    Recurrent  . Vitamin B12 deficiency     Past Surgical History:  Procedure Laterality Date  . APPENDECTOMY  1992  . BIOPSY  08/19/2019   Procedure: BIOPSY;  Surgeon: [name], MD;  Location: [place];  Service: Gastroenterology;;  . CARDIOVASCULAR STRESS TEST  06-23-2012  DR Jenkins [place]   NORMAL LEXISCAN MYOVIEW/ EF 83%/  NO ISCHEMIA  . CO2 LASER APPLICATION  51/76/1607   Procedure: CO2 LASER APPLICATION;  Surgeon: Janie Morning, MD PHD;   Location: [facility];  Service: Gynecology;  Laterality: N/A;  Laser Vaporization  . CO2 LASER APPLICATION N/A 11/05/1060   Procedure: CO2 LASER APPLICATION;  Surgeon: [name], MD;  Location: [facility];  Service: Gynecology;  Laterality: N/A;  . CO2 LASER APPLICATION N/A 6/94/8546   Procedure: CO2 LASER APPLICATION of the vulva;  Surgeon: Janie Morning, MD;  Location: [facility];  Service: Gynecology;  Laterality: N/A;  . COLONOSCOPY WITH PROPOFOL N/A 08/19/2019   Procedure: COLONOSCOPY WITH PROPOFOL;  Surgeon: [name], MD;  Location: [place];  Service: Gastroenterology;  Laterality: N/A;  . ESOPHAGOGASTRODUODENOSCOPY (EGD) WITH PROPOFOL N/A 08/19/2019   Procedure: ESOPHAGOGASTRODUODENOSCOPY (EGD) WITH PROPOFOL;  Surgeon: [name], MD;  Location: [place];  Service: Gastroenterology;  Laterality: N/A;  . ESOPHAGOGASTRODUODENOSCOPY (EGD) WITH PROPOFOL N/A 02/11/2020   Procedure: ESOPHAGOGASTRODUODENOSCOPY (EGD) WITH PROPOFOL;  Surgeon: Ronald Lobo, MD;  Location: [place];  Service: Endoscopy;  Laterality: N/A;  . ESOPHAGOGASTRODUODENOSCOPY (EGD) WITH PROPOFOL N/A 03/05/2020   Procedure: ESOPHAGOGASTRODUODENOSCOPY (EGD) WITH PROPOFOL;  Surgeon: [name], MD;  Location: [place];  Service: Gastroenterology;  Laterality: N/A;  . EXCISION VULVAR LESIONS  07/2012  . GIVENS CAPSULE STUDY N/A 02/12/2020   Procedure: GIVENS CAPSULE STUDY;  Surgeon: Ronald Lobo, MD;  Location: [facility];  Service: Endoscopy;  Laterality: N/A;  . HEMORRHOID SURGERY N/A 04/15/2013   Procedure: Excision of external and internal  hemorrhoid with AIN, Exam under anesthesia;  Surgeon: Odis Hollingshead, MD;  Location: [facility];  Service: General;  Laterality: N/A;  . HIP PINNING,CANNULATED Right 03/15/2019   Procedure: Maebelle Munroe OF FEMORAL NECK FRACTURE;  Surgeon: Renette Butters, MD;  Location: [place];  Service: Orthopedics;  Laterality: Right;  .  POLYPECTOMY  08/19/2019   Procedure: POLYPECTOMY;  Surgeon: [name], MD;  Location: [facility] ENDOSCOPY;  Service: Gastroenterology;;  . REPAIR FISTULA IN ANO/  I & D PERIRECTAL ABSCESS  10-26-2002  DR Johnathan Hausen  . RIGHT/LEFT HEART CATH AND CORONARY ANGIOGRAPHY N/A 01/03/2020   Procedure: RIGHT/LEFT HEART CATH AND CORONARY ANGIOGRAPHY;  Surgeon: Belva Crome, MD;  Location: [place] [facility];  Service: Cardiovascular;  Laterality: N/A;  . TRANSTHORACIC ECHOCARDIOGRAM  06-23-2012   NORMAL LVSF/ EF 55-60%  . VAGINAL HYSTERECTOMY  1997   partial  . VULVAR LESION REMOVAL N/A 11/03/2014   Procedure: CO2 LASER OF VULVAR ;  Surgeon: [name], MD;  Location: [facility];  Service: Gynecology;  Laterality: N/A;  . VULVECTOMY N/A 03/26/2015   Procedure: WIDE LOCA  EXCISION OF VULVAR;  Surgeon: [name], MD;  Location: [place];  Service: Gynecology;  Laterality: N/A;  . Wide Local Excision of labia majora  04-04-2010   Right-sided lesion, CO2 ablation of right labia minora    Current Medications: Current Meds  Medication Sig  . acetaminophen (TYLENOL) 325 MG tablet Take 650 mg by mouth every 6 (six) hours as needed for mild pain.   Marland Kitchen ALPRAZolam (XANAX) 0.5 MG tablet Take 1 tablet (0.5 mg total) by mouth 3 (three) times daily as needed for anxiety.  Marland Kitchen atorvastatin (LIPITOR) 80 MG tablet Take 1 tablet (80 mg total) by mouth daily.  . busPIRone (BUSPAR) 30 MG tablet Take 30 mg by mouth 2 (two) times daily.   . calcium carbonate (OSCAL) 1500 (600 Ca) MG TABS tablet Take 600 mg of elemental calcium by mouth daily.  . carvedilol (COREG) 12.5 MG tablet Take 12.5 mg by mouth 2 (two) times daily with a meal. Patient is taking 1/2 tablet of 12.5 mg tablet  . denosumab (PROLIA) 60 MG/ML SOSY injection Inject 60 mg into the skin every 6 (six) months.  . ferrous sulfate 325 (65 FE) MG tablet Take 325 mg by mouth daily with breakfast.  . furosemide (LASIX) 40 MG tablet Take 1  tablet (40 mg total) by mouth daily.  . hydroxychloroquine (PLAQUENIL) 200 MG tablet Take 200 mg by mouth daily.  . isosorbide mononitrate (IMDUR) 30 MG 24 hr tablet Take 0.5 tablets (15 mg total) by mouth daily.  Marland Kitchen loperamide (IMODIUM) 2 MG capsule Take 1 capsule (2 mg total) by mouth every 6 (six) hours as needed for diarrhea or loose stools.  . mirtazapine (REMERON) 15 MG tablet Take 1 tablet (15 mg total) by mouth at bedtime.  . Multiple Vitamin (MULTIVITAMIN WITH MINERALS) TABS tablet Take 1 tablet by mouth daily.  . Nutritional Supplements (ENSURE ACTIVE HIGH PROTEIN) LIQD Take 1 Can by mouth daily.  Vladimir Faster Glycol-Propyl Glycol (SYSTANE FREE OP) Place 1 drop into both eyes daily as needed (For Dry eyes).  . predniSONE (DELTASONE) 5 MG tablet Take 5 mg by mouth daily.  Marland Kitchen saccharomyces boulardii (FLORASTOR) 250 MG capsule Take 1 capsule (250 mg total) by mouth 2 (two) times daily.  Marland Kitchen spironolactone (ALDACTONE)  25 MG tablet Take 0.5 tablets (12.5 mg total) by mouth daily.     Allergies:   Latex, Augmentin [amoxicillin-pot clavulanate], Doxycycline, Hydrocodone, Lactose intolerance (gi), Lialda [mesalamine], Prinivil [lisinopril], Triamcinolone acetonide, Amlodipine, and Lotensin [benazepril hcl]   Social History   Socioeconomic History  . Marital status: Widowed    Spouse name: Not on file  . Number of children: Not on file  . Years of education: Not on file  . Highest education level: Not on file  Occupational History  . Not on file  Tobacco Use  . Smoking status: Former Smoker    Years: 50.00    Types: Cigarettes    Quit date: 10/03/2019    Years since quitting: 0.5  . Smokeless tobacco: Never Used  Vaping Use  . Vaping Use: Never used  Substance and Sexual Activity  . Alcohol use: Not Currently    Alcohol/week: 2.0 standard drinks    Types: 2 Cans of beer per week  . Drug use: No  . Sexual activity: Yes  Other Topics Concern  . Not on file  Social History Narrative     Social History      Diet? reg      Do you drink/eat things with caffeine? yes      Marital status?                  Widowed                   What year were you married? 1993      Do you live in a house, apartment, assisted living, condo, trailer, etc.? House condo      Is it one or more stories? 2 stories       How many persons live in your home? 1      Do you have any pets in your home? (please list) 0      Highest level of education completed?1 year college       Current or past profession: telemarketer      Do you exercise?                           yes           Type & how often?  Walk 2 - 3  Outside/inside  everyday       Advanced Directives      Do you have a living will? yes      Do you have a DNR form?      yes                            If not, do you want to discuss one?       Do you have signed POA/HPOA for forms? No       Functional Status      Do you have difficulty bathing or dressing yourself? yes      Do you have difficulty preparing food or eating? yes      Do you have difficulty managing your medications? yes      Do you have difficulty managing your finances? yes      Do you have difficulty affording your medications? No       Social Determinants of Health   Financial Resource Strain:   . Difficulty of Paying Living Expenses: Not on file  Food Insecurity:   .  Worried About Charity fundraiser in the Last Year: Not on file  . Ran Out of Food in the Last Year: Not on file  Transportation Needs: No Transportation Needs  . Lack of Transportation (Medical): No  . Lack of Transportation (Non-Medical): No  Physical Activity:   . Days of Exercise per Week: Not on file  . Minutes of Exercise per Session: Not on file  Stress:   . Feeling of Stress : Not on file  Social Connections: Socially Isolated  . Frequency of Communication with Friends and Family: More than three times a week  . Frequency of Social Gatherings with Friends and Family: More  than three times a week  . Attends Religious Services: Never  . Active Member of Clubs or Organizations: No  . Attends Archivist Meetings: Never  . Marital Status: Widowed     Family History: The patient's family history includes Cancer in her son; Diabetes in her mother; Heart attack (age of onset: 80) in her father; Hypertension in her father; Lymphoma in her son.  ROS:   Please see the history of present illness.     All other systems reviewed and are negative.  EKGs/Labs/Other Studies Reviewed:    The following studies were reviewed today:  RIGHT/LEFT HEART CATH AND CORONARY ANGIOGRAPHY 12/2019  Conclusion   Heavy left main and proximal LAD calcification.  Moderate to moderately severe proximal LAD diagonal bifurcation stenosis up to 70%.  Widely patent circumflex.  Widely patent right coronary.  Mid anterior wall hypokinesis greater than other walls related to ischemia from LAD versus dyssynchrony associated with left bundle branch block.  Estimated LVEF is 35 to 40%. LVEDP is 25 mmHg.  Significant mitral regurgitation by 2D Doppler echocardiogram.  Severe hypertension noted during the procedure.  RECOMMENDATIONS:   Medical therapy of heart failure including additional diuresis.  Preventive therapy including high intensity statin therapy for LAD disease. Downstream management could include PCI with debulking using atherectomy versus combined coronary artery bypass grafting and mitral valve repair depending upon the status of the mitral valve and role in development of acute pulmonary edema.  Once stable, may need transesophageal echocardiography.  Further discussion concerning severity of mitral valve disease on appropriate management.  Reassess kidney function in a.m.  In lab hypertension treated with IV hydralazine. Aggressive management of hypertension is required and may have been the driving precipitant for acute heart failure.   ECHO  IMPRESSIONS 12/2019  1. Moderate to severe global reduction in LV systolic function; grade 1  diastolic dysfunction; moderate LVH; moderate MR; severe LAE.  2. Left ventricular ejection fraction, by estimation, is 30 to 35%. The  left ventricle has moderately decreased function. The left ventricle  demonstrates global hypokinesis. There is moderate left ventricular  hypertrophy. Left ventricular diastolic  parameters are consistent with Grade I diastolic dysfunction (impaired  relaxation).  3. Right ventricular systolic function is normal. The right ventricular  size is normal.  4. Left atrial size was severely dilated.  5. The mitral valve is normal in structure. Moderate mitral valve  regurgitation. No evidence of mitral stenosis.  6. The aortic valve is tricuspid. Aortic valve regurgitation is not  visualized. Mild aortic valve sclerosis is present, with no evidence of  aortic valve stenosis.  7. The inferior vena cava is normal in size with greater than 50%  respiratory variability, suggesting right atrial pressure of 3 mmHg.    Carotid studies: 09/08/2018 Right Carotid: Velocities in the right ICA are  consistent with a 1-39% stenosis.  Left Carotid: Velocities in the left ICA are consistent with a 1-39% stenosis.  Vertebrals: Bilateral vertebral arteries demonstrate antegrade flow. Subclavians: Normal flow hemodynamics were seen in bilateral subclavian arteries.   ECHO: 2019 - Left ventricle: The cavity size was normal. Wall thickness was normal. Systolic function was normal. The estimated ejection fraction was in the range of 55% to 60%. Wall motion was normal; there were no regional wall motion abnormalities. Doppler parameters are consistent with abnormal left ventricular relaxation (grade 1 diastolic dysfunction). - Ventricular septum: Septal motion showed abnormal function and dyssynergy.  Impressions:  - Normal LV systolic  function; mild diastolic dysfunction.   Recent Labs: 03/04/2020: B Natriuretic Peptide 1,072.2 03/07/2020: Magnesium 1.7 04/19/2020: ALT 27; BUN 27; Creat 1.34; Hemoglobin 10.3; Platelets 252; Potassium 4.3; Sodium 129; TSH 0.93  Recent Lipid Panel    Component Value Date/Time   CHOL 143 09/24/2017 0343   CHOL 150 11/25/2016 0920   TRIG 24 09/24/2017 0343   HDL 96 09/24/2017 0343   HDL 105 11/25/2016 0920   CHOLHDL 1.5 09/24/2017 0343   VLDL 5 09/24/2017 0343   LDLCALC 42 09/24/2017 0343   LDLCALC 40 11/25/2016 0920    Physical Exam:    VS:  BP 140/80   Pulse 88   Ht 5\' 4"  (1.626 m)   Wt 92 lb 9.6 oz (42 kg)   LMP  (LMP Unknown)   SpO2 97%   BMI 15.89 kg/m     Wt Readings from Last 3 Encounters:  05/01/20 92 lb 9.6 oz (42 kg)  04/19/20 94 lb 9.6 oz (42.9 kg)  04/03/20 93 lb (42.2 kg)     BOF:BPZW in no acute distress HEENT: Normal NECK: No JVD; No carotid bruits LYMPHATICS: No lymphadenopathy CARDIAC: RRR, 2/6 SM, no rubs, gallops RESPIRATORY:  Clear to auscultation without rales, wheezing or rhonchi  ABDOMEN: Soft, non-tender, non-distended MUSCULOSKELETAL:  No edema; No deformity  SKIN: Bruises on arms NEUROLOGIC:  Alert and oriented x 3 PSYCHIATRIC:  Normal affect   ASSESSMENT:    1. Atrial fibrillation, chronic ([place])   2. Acute on chronic combined systolic and diastolic CHF (congestive heart failure) ([place])   3. LBBB (left bundle branch block)    PLAN:    In order of problems listed above:  Chronic systolic heart failure -Most recent ejection fraction was 30%. -On carvedilol, spironolactone, furosemide.  Labs have been reviewed.  Maintaining.  CAD -Continue with aggressive medical management.  No invasive management.  Prior GI bleed -Not on Xarelto or aspirin.  On Prilosec.  Paroxysmal atrial fibrillation -No longer on anticoagulation.  Currently in sinus.  Anxiety/PTSD -Palliative care.  DNR  Medication Adjustments/Labs and Tests  Ordered: Current medicines are reviewed at length with the patient today.  Concerns regarding medicines are outlined above.  No orders of the defined types were placed in this encounter.  No orders of the defined types were placed in this encounter.   Patient Instructions  Medication Instructions:  The current medical regimen is effective;  continue present plan and medications.  *If you need a refill on your cardiac medications before your next appointment, please call your pharmacy*  Follow-Up: At [facility], you and your health needs are our priority.  As part of our continuing mission to provide you with exceptional heart care, we have created designated Provider Care Teams.  These Care Teams include your primary Cardiologist (physician) and Advanced Practice Providers (APPs -  Physician Assistants and  Nurse Practitioners) who all work together to provide you with the care you need, when you need it.  We recommend signing up for the patient portal called "MyChart".  Sign up information is provided on this After Visit Summary.  MyChart is used to connect with patients for Virtual Visits (Telemedicine).  Patients are able to view lab/test results, encounter notes, upcoming appointments, etc.  Non-urgent messages can be sent to your provider as well.   To learn more about what you can do with MyChart, go to [email or web address].    Your next appointment:   6 month(s)  The format for your next appointment:   In Person  Provider:   Candee Furbish, MD   Thank you for choosing [facility]!!        Signed, Candee Furbish, MD  05/01/2020 2:25 PM    [place]

## 2020-05-03 ENCOUNTER — Ambulatory Visit (INDEPENDENT_AMBULATORY_CARE_PROVIDER_SITE_OTHER): Payer: Medicare Other | Admitting: Internal Medicine

## 2020-05-03 ENCOUNTER — Encounter: Payer: Self-pay | Admitting: Internal Medicine

## 2020-05-03 ENCOUNTER — Other Ambulatory Visit: Payer: Self-pay

## 2020-05-03 VITALS — BP 140/68 | HR 93 | Temp 97.8°F | Ht 64.0 in | Wt 94.0 lb

## 2020-05-03 DIAGNOSIS — Z7189 Other specified counseling: Secondary | ICD-10-CM | POA: Diagnosis not present

## 2020-05-03 NOTE — Progress Notes (Signed)
Location:  [facility] clinic Provider:  Levana Minetti L. Mariea Clonts, D.O., C.M.D.  Code Status: DNR  Goals of Care:  Advanced Directives 05/03/2020  Does Patient Have a Medical Advance Directive? Yes  Type of Advance Directive Living will;Out of facility DNR (pink MOST or yellow form)  Does patient want to make changes to medical advance directive? No - Patient declined  Copy of [place] in Chart? Yes - validated most recent copy scanned in chart (See row information)  Would patient like information on creating a medical advance directive? -  Pre-existing out of facility DNR order (yellow form or pink MOST form) -     Chief Complaint  Patient presents with  . Acute Visit    Meeting with Shanon Brow (son) and [place]     HPI: Patient is a 74 y.o. female seen today for a meeting with patient and her son, Shanon Brow.  She's been very nervous to come to this appt.  She has a living will and HCPOA in place and they brought a copy.  She completed these in 2003.    We completed the MOST form today as follows:  DNR Comfort measures Antibiotics if indicated No fluids No tube feeding (as she decided years ago in her living will) Discussed with her and her son, Burna Forts.  Past Medical History:  Diagnosis Date  . Anemia   . Anxiety   . Bruises easily   . CAD (coronary artery disease)    Bifurcational LAD/diagonal disease managed medically  . Cardiomyopathy ([place])   . Chronic hyponatremia   . Colitis   . COPD (chronic obstructive pulmonary disease) ([place])   . Depression   . Essential hypertension   . GERD (gastroesophageal reflux disease)   . GI bleed   . History of atrial fibrillation   . History of chronic bronchitis   . Insomnia   . LBBB (left bundle branch block)   . Macrocytosis   . Major depressive disorder   . Nocturia   . Osteoporosis   . Raynaud's disease   . Sciatica    Right side  . Seronegative rheumatoid arthritis ([place])   . Venous insufficiency of leg   . VIN III  (vulvar intraepithelial neoplasia III)    Recurrent  . Vitamin B12 deficiency     Past Surgical History:  Procedure Laterality Date  . APPENDECTOMY  1992  . BIOPSY  08/19/2019   Procedure: BIOPSY;  Surgeon: [name], MD;  Location: [place];  Service: Gastroenterology;;  . CARDIOVASCULAR STRESS TEST  06-23-2012  DR Jenkins [place]   NORMAL LEXISCAN MYOVIEW/ EF 83%/  NO ISCHEMIA  . CO2 LASER APPLICATION  23/55/7322   Procedure: CO2 LASER APPLICATION;  Surgeon: Janie Morning, MD PHD;  Location[facility];  Service: Gynecology;  Laterality: N/A;  Laser Vaporization  . CO2 LASER APPLICATION N/A 0/10/5425   Procedure: CO2 LASER APPLICATION;  Surgeon: [name], MD;  Location: [facility];  Service: Gynecology;  Laterality: N/A;  . CO2 LASER APPLICATION N/A 0/62/3762   Procedure: CO2 LASER APPLICATION of the vulva;  Surgeon: Janie Morning, MD;  Location: [facility];  Service: Gynecology;  Laterality: N/A;  . COLONOSCOPY WITH PROPOFOL N/A 08/19/2019   Procedure: COLONOSCOPY WITH PROPOFOL;  Surgeon: [name], MD;  Location: [place];  Service: Gastroenterology;  Laterality: N/A;  . ESOPHAGOGASTRODUODENOSCOPY (EGD) WITH PROPOFOL N/A 08/19/2019   Procedure: ESOPHAGOGASTRODUODENOSCOPY (EGD) WITH PROPOFOL;  Surgeon: [name], MD;  Location: [place];  Service: Gastroenterology;  Laterality: N/A;  . ESOPHAGOGASTRODUODENOSCOPY (EGD) WITH PROPOFOL N/A 02/11/2020   Procedure: ESOPHAGOGASTRODUODENOSCOPY (EGD) WITH PROPOFOL;  Surgeon: Ronald Lobo, MD;  Location: [place];  Service: Endoscopy;  Laterality: N/A;  . ESOPHAGOGASTRODUODENOSCOPY (EGD) WITH PROPOFOL N/A 03/05/2020   Procedure: ESOPHAGOGASTRODUODENOSCOPY (EGD) WITH PROPOFOL;  Surgeon: [name], MD;  Location: [place];  Service: Gastroenterology;  Laterality: N/A;  . EXCISION VULVAR LESIONS  07/2012  . GIVENS CAPSULE STUDY N/A 02/12/2020   Procedure: GIVENS CAPSULE  STUDY;  Surgeon: Ronald Lobo, MD;  Location: [facility];  Service: Endoscopy;  Laterality: N/A;  . HEMORRHOID SURGERY N/A 04/15/2013   Procedure: Excision of external and internal  hemorrhoid with AIN, Exam under anesthesia;  Surgeon: Odis Hollingshead, MD;  Location: [facility];  Service: General;  Laterality: N/A;  . HIP PINNING,CANNULATED Right 03/15/2019   Procedure: Maebelle Munroe OF FEMORAL NECK FRACTURE;  Surgeon: Renette Butters, MD;  Location: [place];  Service: Orthopedics;  Laterality: Right;  . POLYPECTOMY  08/19/2019   Procedure: POLYPECTOMY;  Surgeon: [name], MD;  Location: [facility] ENDOSCOPY;  Service: Gastroenterology;;  . REPAIR FISTULA IN ANO/  I & D PERIRECTAL ABSCESS  10-26-2002  DR Johnathan Hausen  . RIGHT/LEFT HEART CATH AND CORONARY ANGIOGRAPHY N/A 01/03/2020   Procedure: RIGHT/LEFT HEART CATH AND CORONARY ANGIOGRAPHY;  Surgeon: Belva Crome, MD;  Location: [place] [facility];  Service: Cardiovascular;  Laterality: N/A;  . TRANSTHORACIC ECHOCARDIOGRAM  06-23-2012   NORMAL LVSF/ EF 55-60%  . VAGINAL HYSTERECTOMY  1997   partial  . VULVAR LESION REMOVAL N/A 11/03/2014   Procedure: CO2 LASER OF VULVAR ;  Surgeon: [name], MD;  Location[facility];  Service: Gynecology;  Laterality: N/A;  . VULVECTOMY N/A 03/26/2015   Procedure: WIDE LOCA  EXCISION OF VULVAR;  Surgeon: [name], MD;  Location: [place];  Service: Gynecology;  Laterality: N/A;  . Wide Local Excision of labia majora  04-04-2010   Right-sided lesion, CO2 ablation of right labia minora    Allergies  Allergen Reactions  . Latex Swelling and Other (See Comments)     fever blisters  . Augmentin [Amoxicillin-Pot Clavulanate] Nausea And Vomiting  . Doxycycline Other (See Comments)    Blurred vision, nervous, unsteady  . Hydrocodone Itching    Acetaminophen:itching, confusion, hallucinations-side effects-Onset date 07/15/2018  . Lactose Intolerance (Gi) Other (See Comments)     Unknown, per pt  . Lialda [Mesalamine] Diarrhea  . Prinivil [Lisinopril] Diarrhea  . Triamcinolone Acetonide Other (See Comments)    REDNESS AND PAIN  . Amlodipine Rash  . Lotensin [Benazepril Hcl] Rash    Outpatient Encounter Medications as of 05/03/2020  Medication Sig  . acetaminophen (TYLENOL) 325 MG tablet Take 650 mg by mouth every 6 (six) hours as needed for mild pain.   Marland Kitchen ALPRAZolam (XANAX) 0.5 MG tablet Take 1 tablet (0.5 mg total) by mouth 3 (three) times daily as needed for anxiety.  Marland Kitchen atorvastatin (LIPITOR) 80 MG tablet Take 1 tablet (80 mg total) by mouth daily.  . busPIRone (BUSPAR) 30 MG tablet Take 30 mg by mouth 2 (two) times daily.   . calcium carbonate (OSCAL) 1500 (600 Ca) MG TABS tablet Take 600 mg of elemental calcium by mouth daily.  . carvedilol (COREG) 12.5 MG tablet Take 12.5 mg by mouth 2 (two) times daily with a meal. Patient is taking 1/2 tablet of 12.5 mg tablet  . denosumab (PROLIA) 60 MG/ML SOSY injection Inject 60 mg  into the skin every 6 (six) months.  . ferrous sulfate 325 (65 FE) MG tablet Take 325 mg by mouth daily with breakfast.  . furosemide (LASIX) 40 MG tablet Take 1 tablet (40 mg total) by mouth daily.  . hydroxychloroquine (PLAQUENIL) 200 MG tablet Take 200 mg by mouth daily.  . isosorbide mononitrate (IMDUR) 30 MG 24 hr tablet Take 0.5 tablets (15 mg total) by mouth daily.  Marland Kitchen loperamide (IMODIUM) 2 MG capsule Take 1 capsule (2 mg total) by mouth every 6 (six) hours as needed for diarrhea or loose stools.  . mirtazapine (REMERON) 15 MG tablet Take 1 tablet (15 mg total) by mouth at bedtime.  . Multiple Vitamin (MULTIVITAMIN WITH MINERALS) TABS tablet Take 1 tablet by mouth daily.  . Nutritional Supplements (ENSURE ACTIVE HIGH PROTEIN) LIQD Take 1 Can by mouth daily.  Vladimir Faster Glycol-Propyl Glycol (SYSTANE FREE OP) Place 1 drop into both eyes daily as needed (For Dry eyes).  . predniSONE (DELTASONE) 5 MG tablet Take 5 mg by mouth daily.  Marland Kitchen  saccharomyces boulardii (FLORASTOR) 250 MG capsule Take 1 capsule (250 mg total) by mouth 2 (two) times daily.  . [DISCONTINUED] spironolactone (ALDACTONE) 25 MG tablet Take 0.5 tablets (12.5 mg total) by mouth daily.   No facility-administered encounter medications on file as of 05/03/2020.     Health Maintenance  Topic Date Due  . PNA vac Low Risk Adult (1 of 2 - PCV13) Never done  . INFLUENZA VACCINE  04/01/2020  . TETANUS/TDAP  09/11/2028  . COLONOSCOPY  08/18/2029  . DEXA SCAN  Completed  . COVID-19 Vaccine  Completed  . Hepatitis C Screening  Completed    Physical Exam: Vitals:   05/03/20 1301  BP: 140/68  Pulse: 93  Temp: 97.8 F (36.6 C)  TempSrc: Temporal  SpO2: 97%  Weight: 94 lb (42.6 kg)  Height: 5\' 4"  (1.626 m)   Body mass index is 16.14 kg/m. Frail lady seated in wheelchair  Labs reviewed: Basic Metabolic Panel: Recent Labs    12/31/19 0644 12/31/19 0939 01/06/20 0820 02/09/20 1317 03/06/20 0425 03/06/20 1230 03/06/20 1500 03/06/20 1507 03/07/20 0834 03/08/20 0504 03/16/20 1509 03/26/20 1058 04/19/20 1535  NA  --    < > 124*   < > 122*   < >   < >  --  127*   < > 130* 128* 129*  K  --    < > 4.2   < > 4.1   < >   < >  --  3.4*   < > 4.5 4.1 4.3  CL  --    < > 94*   < > 93*   < >   < >  --  95*   < > 96* 92* 93*  CO2  --    < > 25   < > 21*   < >   < >  --  22   < > 24 21 26   GLUCOSE  --    < > 111*   < > 109*   < >   < >  --  170*   < > 120 101* 118*  BUN  --    < > 28*   < > 30*   < >   < >  --  26*   < > 17 12 27*  CREATININE  --    < > 1.07*   < > 1.61*   < >   < >  --  1.57*   < > 1.31* 1.28* 1.34*  CALCIUM  --    < > 7.9*   < > 8.2*   < >   < >  --  8.2*   < > 9.8 8.9 9.9  MG  --    < > 2.7*  --  1.9  --   --   --  1.7  --   --   --   --   PHOS  --   --   --   --  3.3  --   --   --  2.3*  --   --   --   --   TSH 2.889  --   --   --   --   --   --  1.040  --   --   --   --  0.93   < > = values in this interval not displayed.   Liver  Function Tests: Recent Labs    03/06/20 0425 03/06/20 0425 03/07/20 0834 03/07/20 0834 03/08/20 0504 03/16/20 1509 04/19/20 1535  AST 22   < > 25   < > 22 23 29   ALT 26   < > 26   < > 23 24 27   ALKPHOS 35*  --  39  --  35*  --   --   BILITOT 0.6   < > 0.7   < > 0.8 0.4 0.8  PROT 4.9*   < > 5.1*   < > 4.8* 6.0* 6.8  ALBUMIN 2.6*  --  2.7*  --  2.4*  --   --    < > = values in this interval not displayed.   No results for input(s): LIPASE, AMYLASE in the last 8760 hours. No results for input(s): AMMONIA in the last 8760 hours. CBC: Recent Labs    03/07/20 0834 03/08/20 0504 03/16/20 1509 03/26/20 1058 04/19/20 1535  WBC 8.4   < > 9.6 7.7 7.5  NEUTROABS 6.4  --  7,536  --  5,430  HGB 9.1*   < > 9.3* 9.5* 10.3*  HCT 27.2*   < > 27.5* 28.7* 30.1*  MCV 94.8   < > 94.8 96 95.9  PLT 169   < > 186 220 252   < > = values in this interval not displayed.   Lipid Panel: No results for input(s): CHOL, HDL, LDLCALC, TRIG, CHOLHDL, LDLDIRECT in the last 8760 hours. Lab Results  Component Value Date   HGBA1C 5.4 09/24/2017    Procedures since last visit: No results found.  Assessment/Plan 1. ACP (advance care planning) 35 mins spent discussing and completing MOST today  Next appt:  Keep oct 25th appt, cancel 9/8 appt   Cassundra Mckeever L. Samariyah Cowles, D.O. [facility] [address], [place] [address] Cell Phone (Mon-Fri 8am-5pm):  [phone] On Call:  [phone] & follow prompts after 5pm & weekends Office Phone:  [phone] Office Fax:  [phone]

## 2020-05-09 ENCOUNTER — Ambulatory Visit: Payer: PPO | Admitting: Internal Medicine

## 2020-05-30 ENCOUNTER — Telehealth: Payer: Self-pay | Admitting: Internal Medicine

## 2020-05-30 DIAGNOSIS — F411 Generalized anxiety disorder: Secondary | ICD-10-CM

## 2020-05-30 NOTE — Telephone Encounter (Signed)
I have not received a single message until this one.  Not sure where they were left.  Ok to increase xanax to 1mg  po tid prn panic attacks/anxiety.  I expect this will be very sedating for her so family should be aware of this in advance.

## 2020-05-30 NOTE — Telephone Encounter (Signed)
Regina Ortiz with [facility] states that she has left several messages for Dr. Mariea Clonts and has not gotten response.  She needs to know if patient's anxiety medication can be increased.  Patient is experiencing very high anxiety.  Per Regina Ortiz they will also have meeting with patient's family on Friday 06/01/20.  Please call Regina Ortiz at [phone].  Thank you  Adan Sis

## 2020-05-31 NOTE — Telephone Encounter (Signed)
Spoke to [employer] and he took the order to increase her anxiety medication.

## 2020-06-01 MED ORDER — ALPRAZOLAM 1 MG PO TABS: 1.0000 mg | ORAL_TABLET | Freq: Three times a day (TID) | ORAL | 0 refills | Status: DC | PRN

## 2020-06-01 NOTE — Telephone Encounter (Signed)
Xanax is taken 0.5 MG 3 TIMES A DAY/ BUT MOST 4 TIMES A DAY, BUSPAR 2 X DAILY 30 MG.  Manuela Schwartz nurse from [facility] would like to know what the increase was for the anxiety medication  for [facility]. She stated her anxiety has gotten worse and would like an answer by Monday. She also stated that if you do increase could you send it to the pharmacy. She would like a call back with your answer. Please advise. [phone]

## 2020-06-01 NOTE — Telephone Encounter (Signed)
Xanax Rx sent to [facility].  CMA had previously notified hospice nurse, Thomes Dinning, who'd answered when called about increased xanax to 1mg  po tid prn from 0.5mg  po tid prn--that was yesterday after we received the one phone call and message.

## 2020-06-01 NOTE — Addendum Note (Signed)
Addended by: Gayland Curry on: 06/01/2020 02:31 PM   Modules accepted: Orders

## 2020-06-13 ENCOUNTER — Telehealth: Payer: Self-pay

## 2020-06-13 NOTE — Telephone Encounter (Signed)
The 3 month extension sounds appropriate and glad the increased xanax has calmed her.

## 2020-06-13 NOTE — Telephone Encounter (Signed)
Regina Ortiz at [facility] [phone]) called and was wanting ok to extend services for 3 mo. She also stated that the increased dose of Xanax was working very well for the patient.

## 2020-06-13 NOTE — Telephone Encounter (Signed)
Called Manuela Schwartz back and let her know extension was okay.

## 2020-06-25 ENCOUNTER — Ambulatory Visit: Payer: PPO | Admitting: Internal Medicine

## 2020-06-27 ENCOUNTER — Telehealth: Payer: Self-pay | Admitting: *Deleted

## 2020-06-27 NOTE — Telephone Encounter (Signed)
Let's try some triple antibiotic ointment or neosporin bid on the boils after bathing or cleaning up after incontinence care

## 2020-06-27 NOTE — Telephone Encounter (Signed)
Susan notified.

## 2020-06-27 NOTE — Telephone Encounter (Signed)
Regina Ortiz with [facility] called and stated that patient has 3 little boils in her perineal/Labia area. Stated that she has been using OTC Boil Ointment with no relief. Wonders if you have any advise of what she could use to help.  Please Advise.

## 2020-07-04 ENCOUNTER — Other Ambulatory Visit: Payer: Self-pay | Admitting: Internal Medicine

## 2020-07-04 DIAGNOSIS — F411 Generalized anxiety disorder: Secondary | ICD-10-CM

## 2020-07-06 ENCOUNTER — Telehealth: Payer: Self-pay

## 2020-07-06 NOTE — Telephone Encounter (Signed)
Manuela Schwartz with [facility] called to give Dr.Reed notification that patient fell overnight and had a slight skin tear on her arm. Manuela Schwartz will continue to encourage the family to seek overnight care. Patient is stable.  FYI only

## 2020-07-10 ENCOUNTER — Telehealth: Payer: Self-pay

## 2020-07-10 NOTE — Telephone Encounter (Signed)
Regina Ortiz from [facility] called to inform us that Regina Ortiz has had to falls this week.. She has a few skin tears. The family is in the process of trying to get her 24 hour care in the home. Regina Ortiz would like to know what is the maximum dose of tylenol could she have per day. She wants the dose increased for pain. Please advise

## 2020-07-10 NOTE — Telephone Encounter (Signed)
Pt can take a maximum of 3000mg  per day of tylenol.

## 2020-07-11 NOTE — Telephone Encounter (Signed)
Message given to Manuela Schwartz

## 2020-07-18 ENCOUNTER — Telehealth: Payer: Self-pay | Admitting: *Deleted

## 2020-07-18 MED ORDER — CEPHALEXIN 500 MG PO CAPS
500.0000 mg | ORAL_CAPSULE | Freq: Two times a day (BID) | ORAL | 0 refills | Status: AC
Start: 2020-07-18 — End: ?

## 2020-07-18 NOTE — Telephone Encounter (Signed)
Manuela Schwartz Notified.

## 2020-07-18 NOTE — Telephone Encounter (Signed)
Regina Ortiz with [facility] called and stated that patient has a possible UTI. Stating that she is burning when she urinates.  Patient is bed bound and weak. Has Caregivers 24/7. Caregivers are using briefs on patient due to frequent falls.   Nurse is requesting an antibiotic to be called into [facility].

## 2020-07-18 NOTE — Telephone Encounter (Signed)
Keflex sent to [facility].

## 2020-07-19 ENCOUNTER — Other Ambulatory Visit: Payer: Self-pay | Admitting: Internal Medicine

## 2020-08-03 ENCOUNTER — Telehealth: Payer: Self-pay | Admitting: *Deleted

## 2020-08-03 NOTE — Telephone Encounter (Signed)
Patient's nephew, Paul Half, called stating that he is moving to be patient's trustee and met with Agustina Caroli. Stated that the family needs a letter of Incapacity from the PCP.  (I do not see Paul Half having any DPR in patient's chart.) Please Advise.

## 2020-08-03 NOTE — Telephone Encounter (Signed)
I suppose we will somehow need to get a DPR on the chart.  She certainly meets criteria for the letter to be completed once we have permission to release info to him.

## 2020-08-03 NOTE — Telephone Encounter (Signed)
error 

## 2020-08-06 NOTE — Telephone Encounter (Signed)
Letter completed.

## 2020-08-06 NOTE — Telephone Encounter (Signed)
Patient son, Eston Mould, [phone] called and stated that he approves the letter of Incapacity for patient and gives permission.

## 2020-08-06 NOTE — Telephone Encounter (Signed)
LMOM to return call.

## 2020-08-06 NOTE — Telephone Encounter (Signed)
Paul Half called back and stated that he will have Arlee Muslim, Son to call and request the letter.

## 2020-08-07 ENCOUNTER — Other Ambulatory Visit: Payer: Self-pay | Admitting: Family

## 2020-08-07 ENCOUNTER — Other Ambulatory Visit: Payer: Self-pay | Admitting: Internal Medicine

## 2020-08-07 DIAGNOSIS — F411 Generalized anxiety disorder: Secondary | ICD-10-CM

## 2020-08-07 DIAGNOSIS — F409 Phobic anxiety disorder, unspecified: Secondary | ICD-10-CM

## 2020-08-07 DIAGNOSIS — F321 Major depressive disorder, single episode, moderate: Secondary | ICD-10-CM

## 2020-08-07 NOTE — Telephone Encounter (Signed)
Shanon Brow notified and agreed.  Letter placed up front for pick up

## 2020-08-08 NOTE — Telephone Encounter (Signed)
Patient is requesting refill on "Xanax 1mg ". Last refill was 07/04/2020 with 90 tablets to be taken 3 times daily as needed with no refills. Patient is due for refill. Medication pend and sent to PCP Mariea Clonts, Tiffany L, DO . Please advise.

## 2020-08-16 ENCOUNTER — Encounter: Payer: Self-pay | Admitting: Internal Medicine

## 2020-09-01 DEATH — deceased

## 2020-09-16 IMAGING — CT CT LUMBAR SPINE WITHOUT CONTRAST
4 of 6 series · 15 of 33 positions shown, 17 images · non-contrast
Comparison: CT, 02/15/2019

CLINICAL DATA: Per ed notes: Pt WIDJAI [REDACTED] from home alone. Pt was
changing her thermostat when she lost her balance and fell. Pt has
hx of back surgery on [DATE]

EXAM:
CT LUMBAR SPINE WITHOUT CONTRAST
TECHNIQUE: Multidetector CT imaging of the lumbar spine was performed without
intravenous contrast administration. Multiplanar CT image
reconstructions were also generated.

[Series 5: l spine soft · axial · 0.26mm/px · z∈[-612,-528]mm · 3 of 125 slices shown]
[im 21/125  soft-tissue]
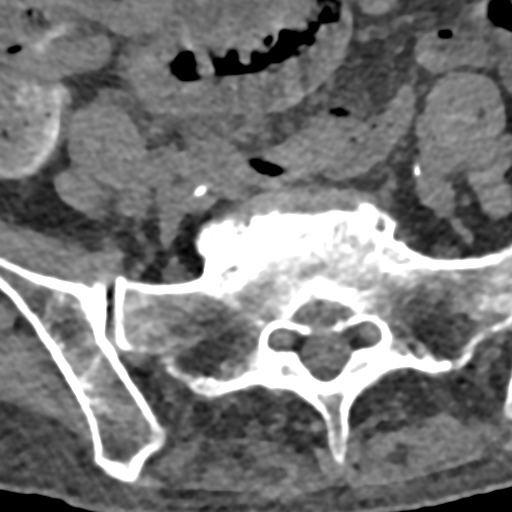
[im 42/125  soft-tissue]
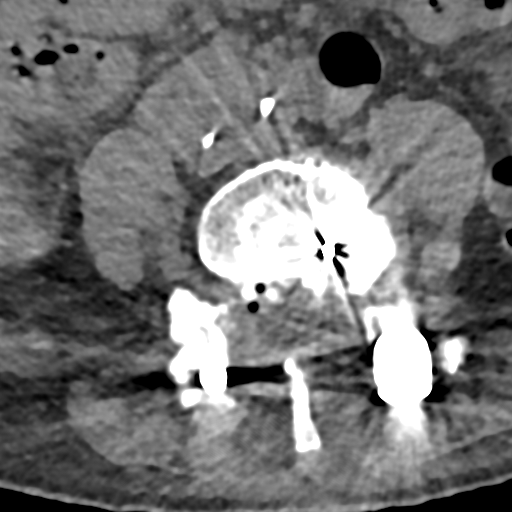
[im 63/125  soft-tissue]
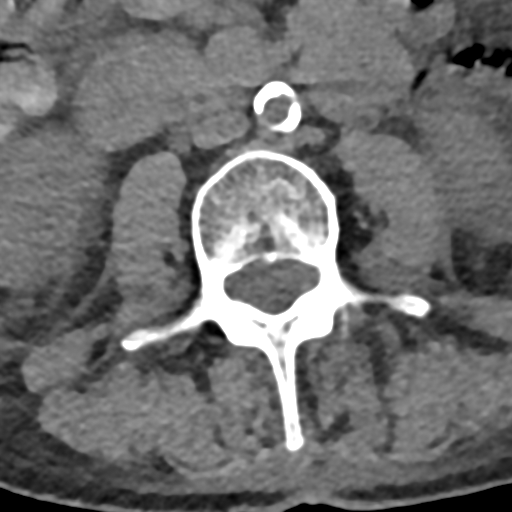

[Series 8: cor st · coronal · 0.49mm/px · 2 of 104 slices shown]
[im 35/104  bone]
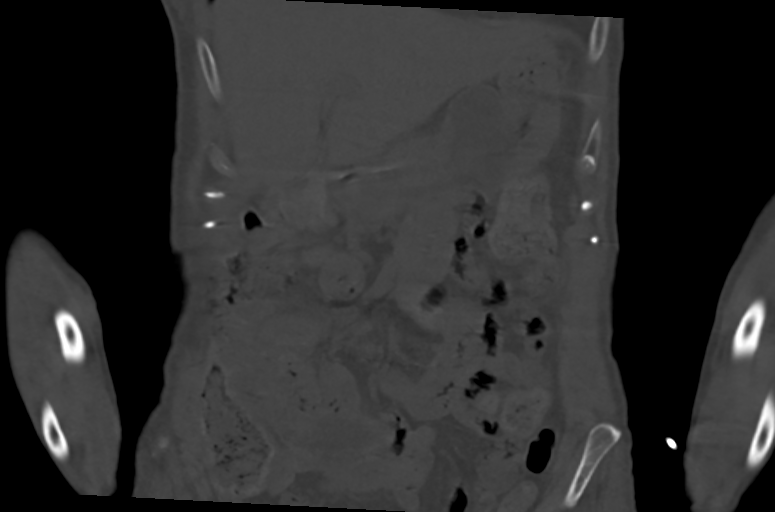
[im 69/104  bone]
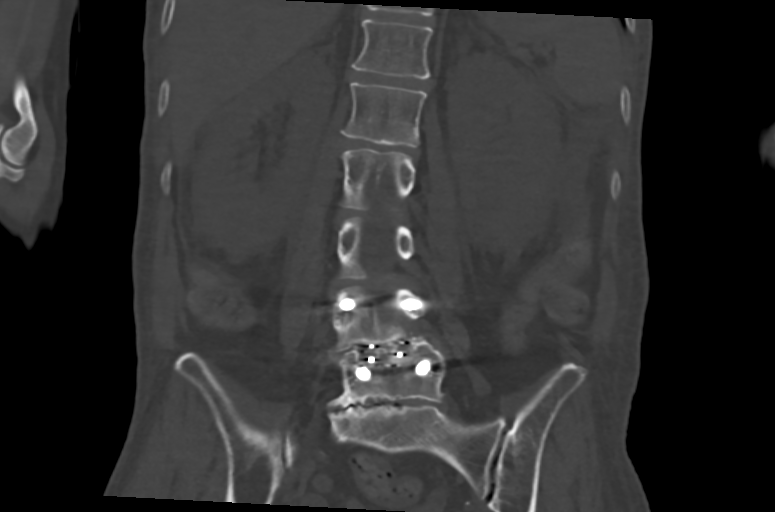

[Series 9: sag st · sagittal · 0.40mm/px · 5 of 85 slices shown]
[im 13/85  bone]
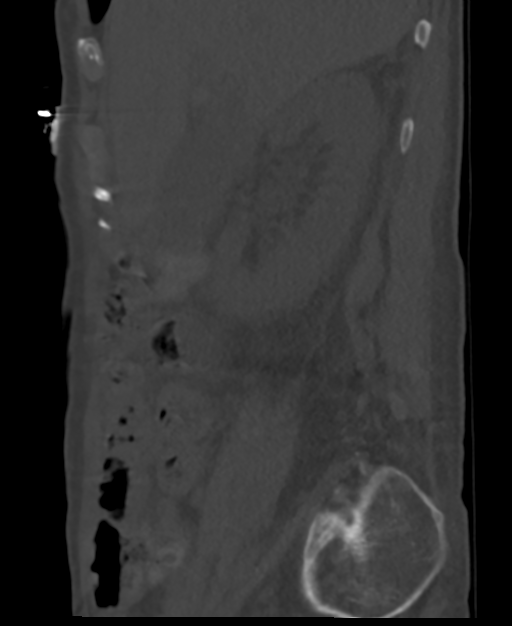
[im 25/85  bone]
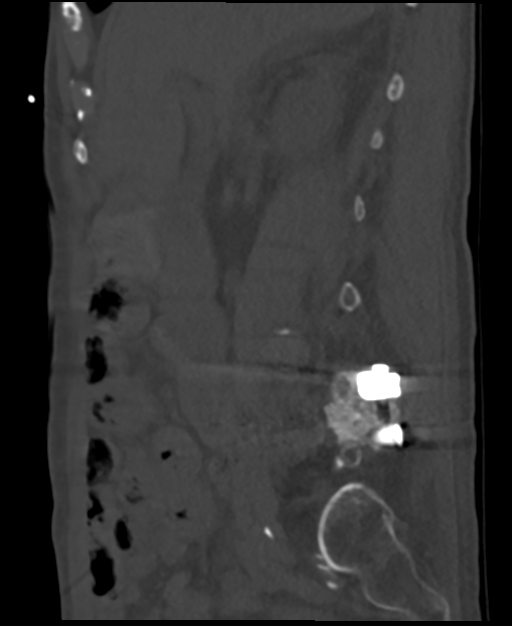
[im 37/85  bone]
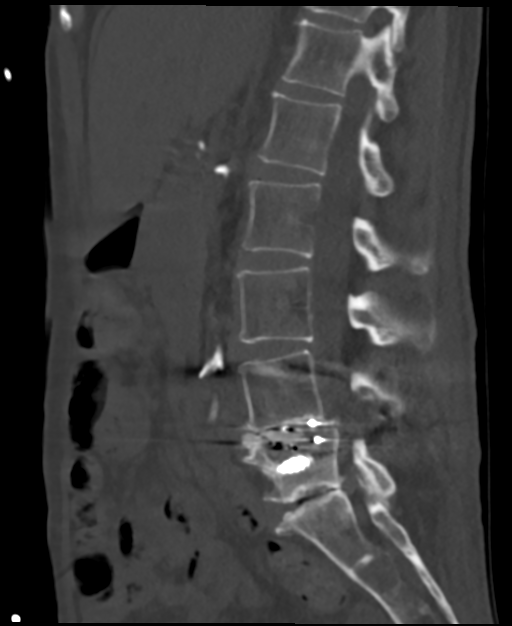
[im 49/85  bone]
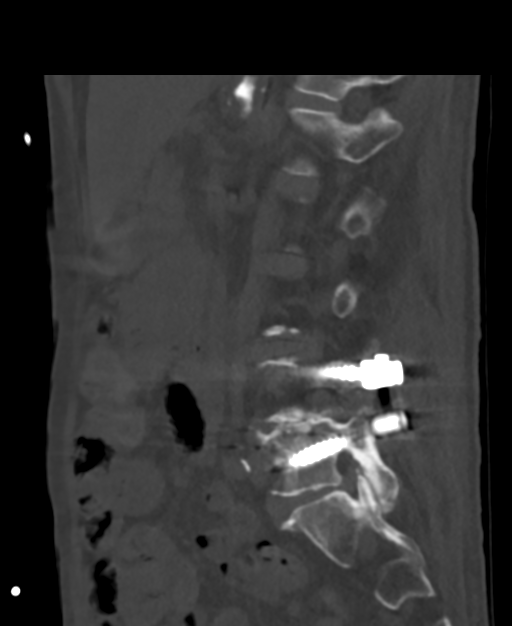
[im 61/85  bone]
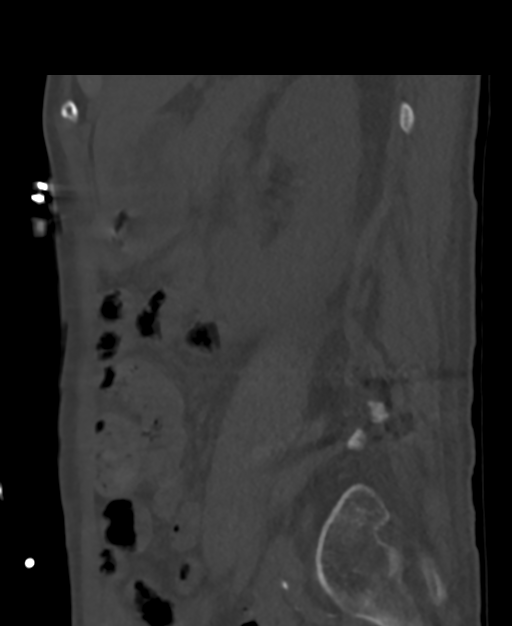

[Series 10: orthogonal · axial · 0.21mm/px · z∈[-619,-433]mm · 5 of 120 slices shown, 7 images]
[im 20/120  soft-tissue]
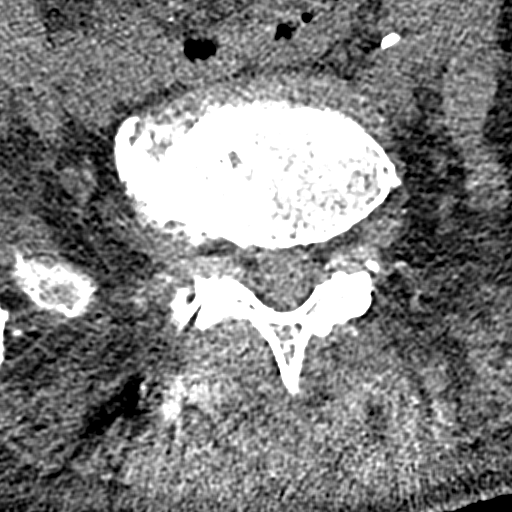
[im 20/120  bone]
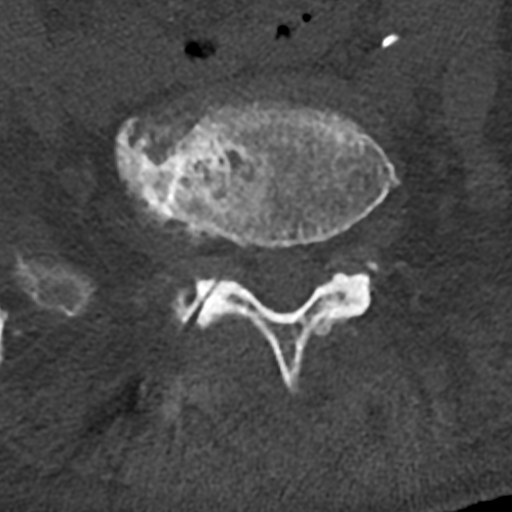
[im 40/120  bone]
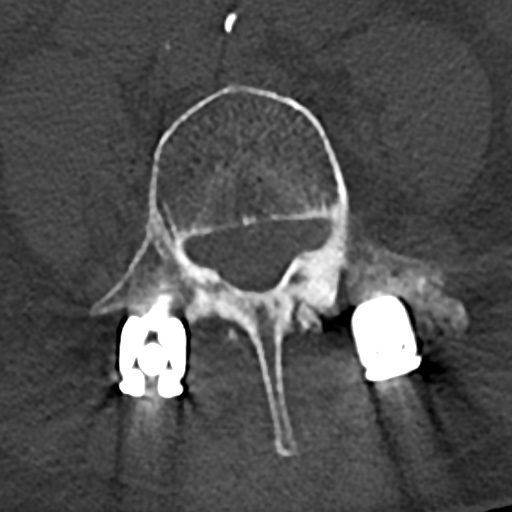
[im 60/120  bone]
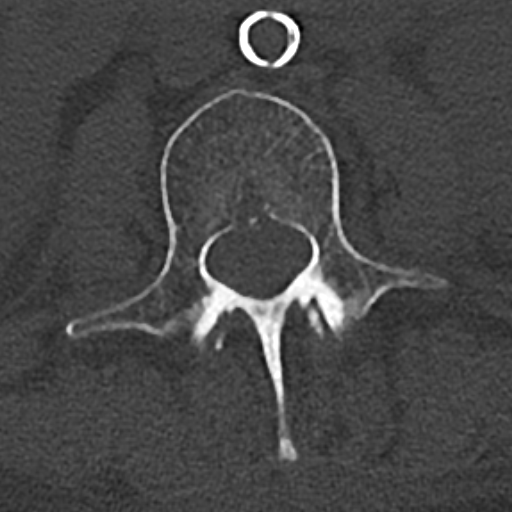
[im 80/120  bone]
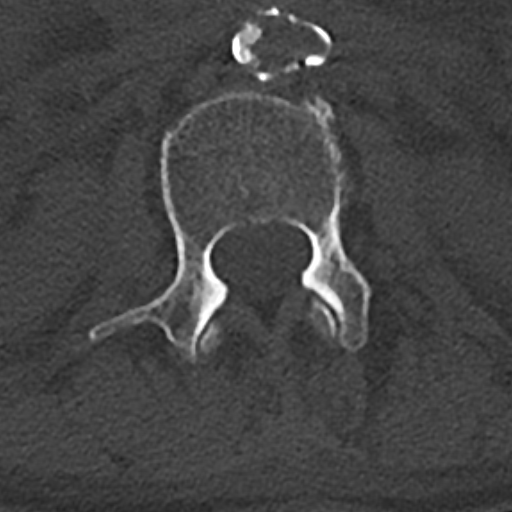
[im 100/120  soft-tissue]
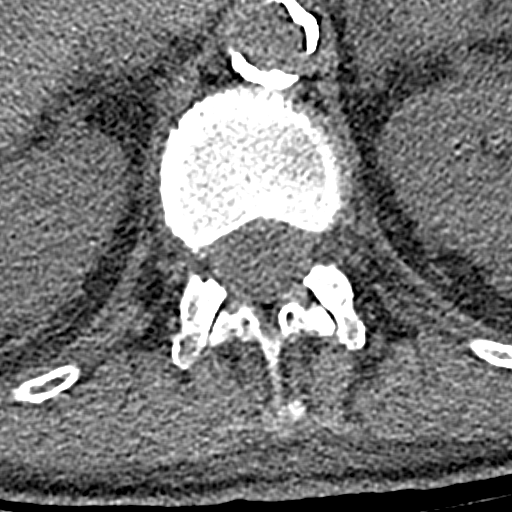
[im 100/120  bone]
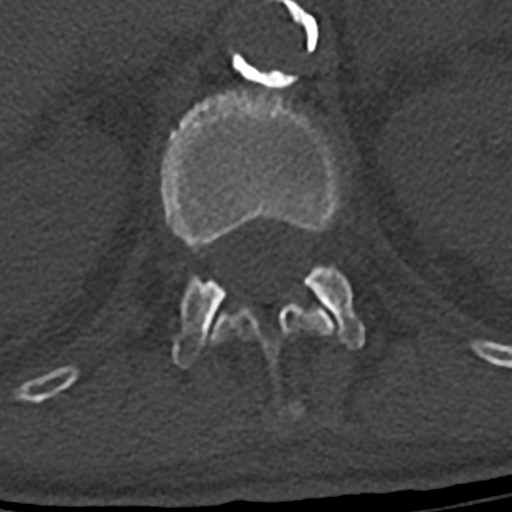

[15 of 33 positions shown; findings below may reference images not displayed]

FINDINGS: Segmentation: 5 lumbar type vertebrae.

Alignment: Normal.

Vertebrae: Choose 1

Paraspinal and other soft tissues: No masses or adenopathy. Chronic
bilateral perinephric stranding similar to the prior CT. Dense
aortic atherosclerotic calcifications.

Disc levels: T12-L1 through L2-L3: No disc bulging or herniation. No
stenosis.

L3-L4: Mild spondylotic disc bulging. No disc herniation. No
significant stenosis.

L4-L5: Bilateral pedicle screws and interconnecting rods fuse L4-L5.
The orthopedic hardware is well-seated and unchanged from the prior
abdomen and pelvis CT. There is a disc spacer and bone graft
material within the L4-L5 disc interspace and along the posterior
elements. This all appears stable from the prior study.

L5-S1: Moderate loss of disc height. Mild facet degenerative change.
Spondylotic disc bulging, but no disc herniation. Mild right neural
foraminal narrowing.
IMPRESSION: 1. No fracture or acute finding.
2. Status post posterior lumbar spine fusion at L4-L5. No evidence
of disruption of the orthopedic hardware. No change from the study
dated 02/15/2019.

## 2020-09-16 IMAGING — CT CT HEAD WITHOUT CONTRAST
5 of 14 series · 15 of 47 positions shown, 17 images · non-contrast
Comparison: 09/24/2017

CLINICAL DATA: Pt TIGER [REDACTED] from home alone. Pt was changing her
thermostat when she lost her balance and fell. Pt has hx of back
surgery on [DATE]

EXAM:
CT HEAD WITHOUT CONTRAST
CT MAXILLOFACIAL WITHOUT CONTRAST
CT CERVICAL SPINE WITHOUT CONTRAST
TECHNIQUE: Multidetector CT imaging of the head, cervical spine, and
maxillofacial structures were performed using the standard protocol
without intravenous contrast. Multiplanar CT image reconstructions
of the cervical spine and maxillofacial structures were also
generated.

[Series 5: head bone · axial · 0.39mm/px · z∈[-78,+2]mm · 3 of 82 slices shown]
[im 21/82  bone]
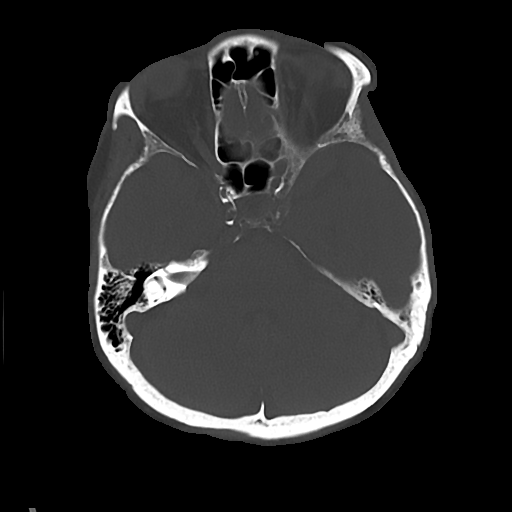
[im 41/82  bone]
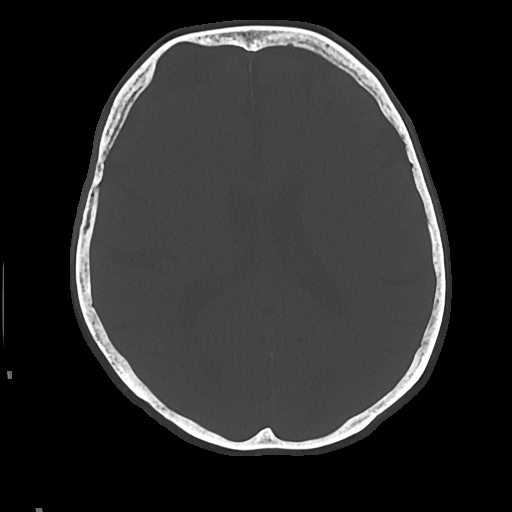
[im 61/82  bone]
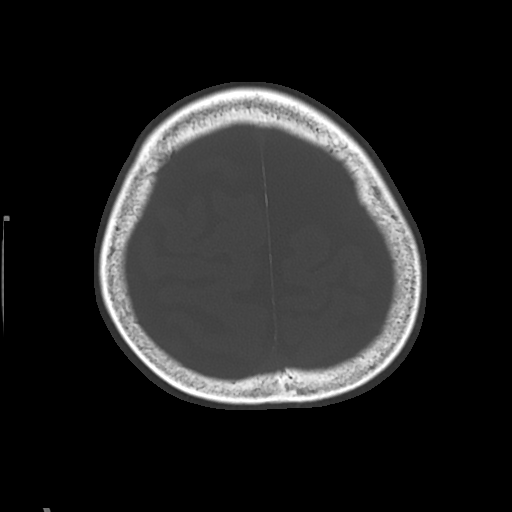

[Series 8: maxilllofacial 2.0 hr40 3 · axial · 0.31mm/px · z∈[-162,-82]mm · 3 of 81 slices shown]
[im 21/81  brain]
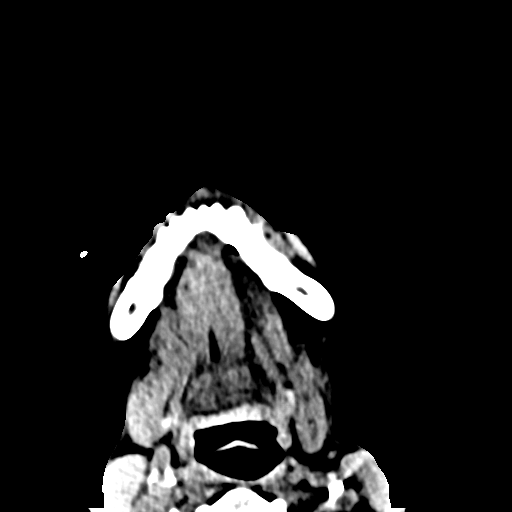
[im 41/81  brain]
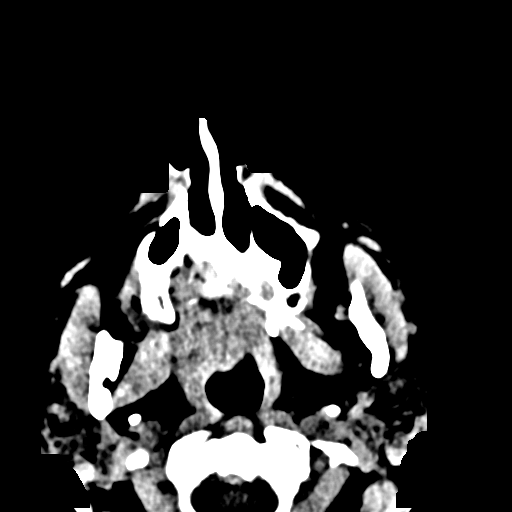
[im 61/81  brain]
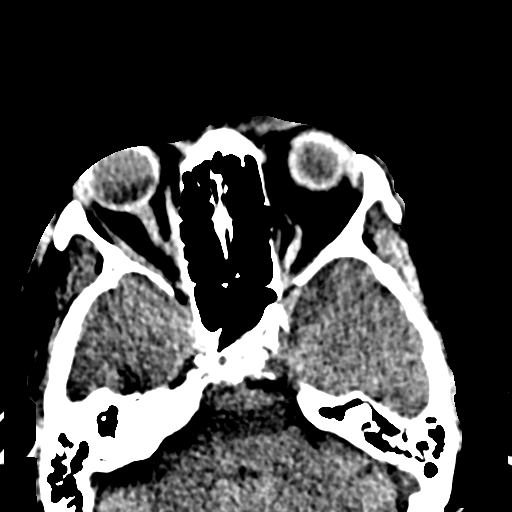

[Series 10: maxilllofacial 2.0 hr59 3 · axial · 0.31mm/px · z∈[-162,-82]mm · 3 of 81 slices shown]
[im 21/81  brain]
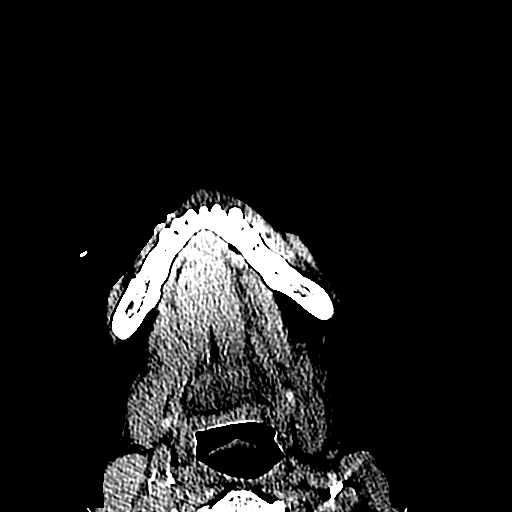
[im 41/81  brain]
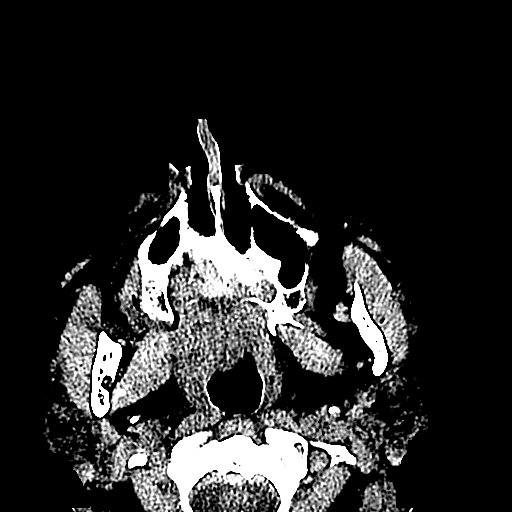
[im 61/81  brain]
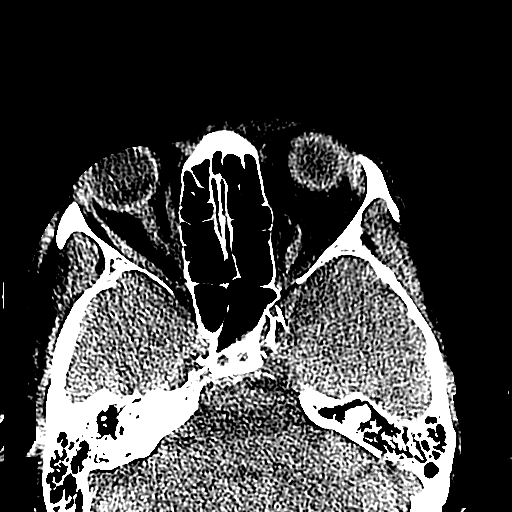

[Series 14: bone cor · coronal · 0.31mm/px · 1 of 98 slices shown]
[im 49/98  brain]
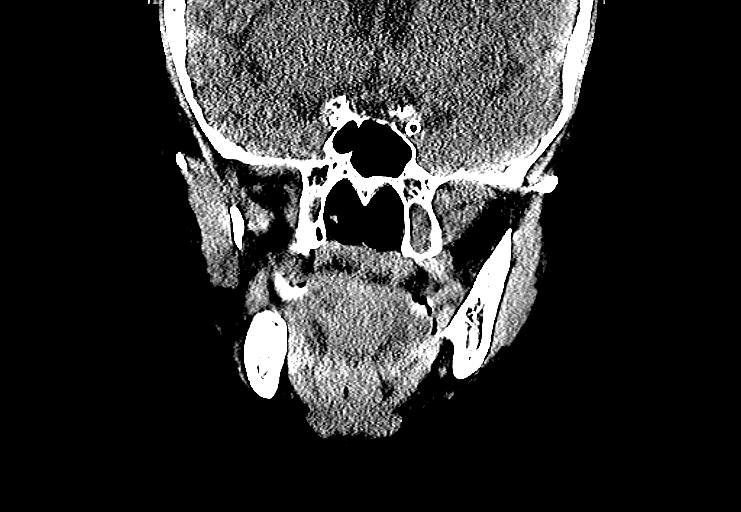

[Series 20: orthogonal axials · axial · 0.21mm/px · z∈[-255,-137]mm · 5 of 108 slices shown, 7 images]
[im 18/108  brain]
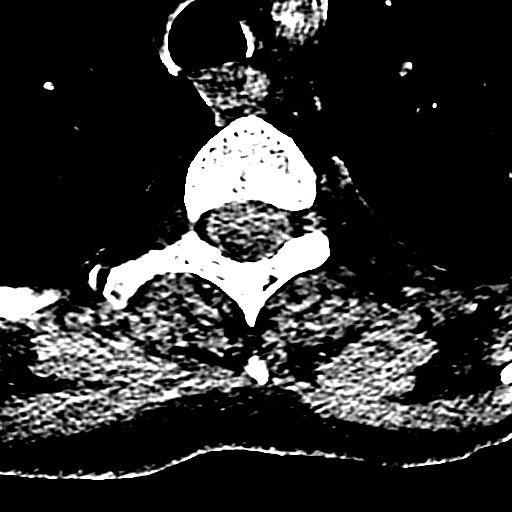
[im 18/108  bone]
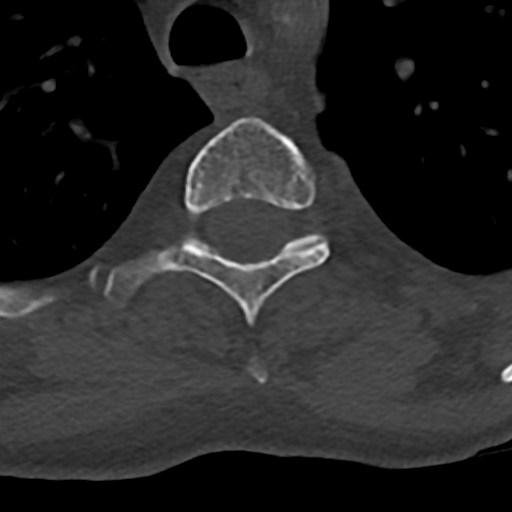
[im 36/108  brain]
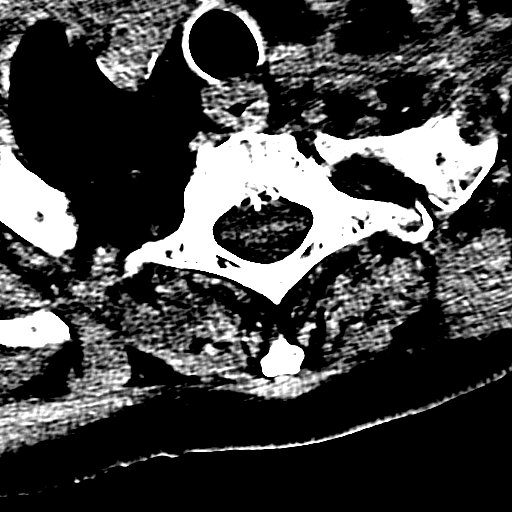
[im 54/108  brain]
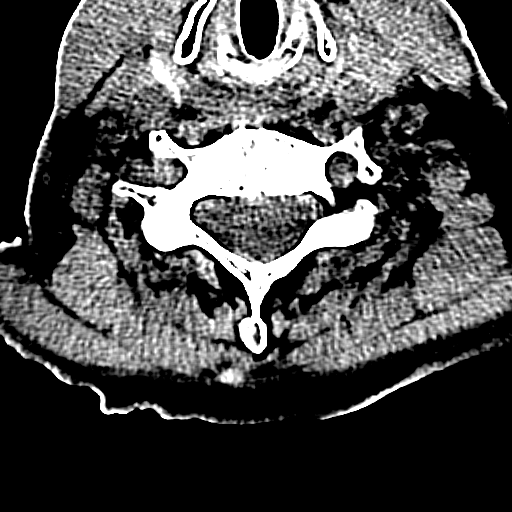
[im 72/108  brain]
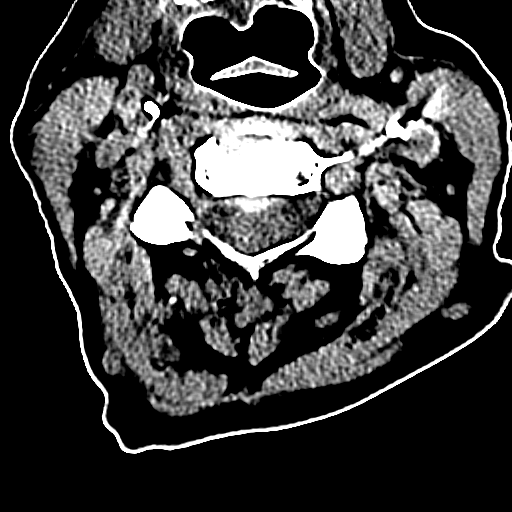
[im 90/108  brain]
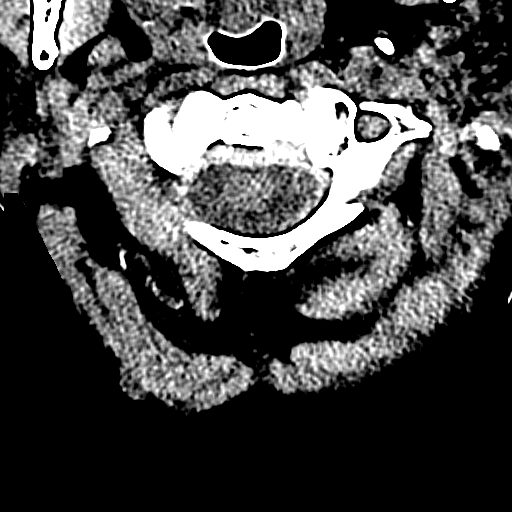
[im 90/108  bone]
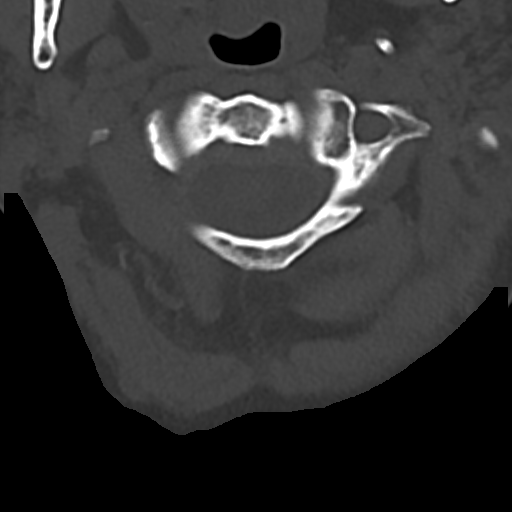

[15 of 47 positions shown; findings below may reference images not displayed]

FINDINGS: CT HEAD FINDINGS

Brain: No evidence of acute infarction, hemorrhage, hydrocephalus,
extra-axial collection or mass lesion/mass effect.

Vascular: No hyperdense vessel or unexpected calcification.

Skull: Normal. Negative for fracture or focal lesion.

Other: None.

CT MAXILLOFACIAL FINDINGS

Osseous: No fracture or mandibular dislocation. No destructive
process.

Orbits: Negative. No traumatic or inflammatory finding.

Sinuses: Clear.

Soft tissues: Negative.

CT CERVICAL SPINE FINDINGS

Alignment: Normal.

Skull base and vertebrae: No acute fracture. No primary bone lesion
or focal pathologic process.

Soft tissues and spinal canal: No prevertebral fluid or swelling. No
visible canal hematoma.

Disc levels: Mild loss of disc height at C4-C5 and C5-C6. Small
endplate osteophytes at these levels. Minor disc bulging. No
convincing disc herniation. There are facet degenerative changes
most evident bilaterally at C5-C6.

Upper chest: No acute findings. No masses or adenopathy. Mild
centrilobular emphysema and scarring at the apices of the lungs.

Other: None.
IMPRESSION: HEAD CT

1. No intracranial abnormality.
2. No skull fracture.

MAXILLOFACIAL CT

1. No fracture or acute finding.

CERVICAL CT

1. No fracture or acute finding.

## 2021-04-17 ENCOUNTER — Other Ambulatory Visit (HOSPITAL_COMMUNITY): Payer: Self-pay
